# Patient Record
Sex: Female | Born: 1955
Health system: Southern US, Community
[De-identification: ages and names within clinical notes are randomized; demographics above are authoritative.]

## PROBLEM LIST (undated history)

## (undated) DIAGNOSIS — R5383 Other fatigue: Secondary | ICD-10-CM

## (undated) DIAGNOSIS — C801 Malignant (primary) neoplasm, unspecified: Secondary | ICD-10-CM

## (undated) DIAGNOSIS — J449 Chronic obstructive pulmonary disease, unspecified: Secondary | ICD-10-CM

## (undated) DIAGNOSIS — R0600 Dyspnea, unspecified: Secondary | ICD-10-CM

## (undated) DIAGNOSIS — F411 Generalized anxiety disorder: Secondary | ICD-10-CM

## (undated) DIAGNOSIS — J42 Unspecified chronic bronchitis: Secondary | ICD-10-CM

## (undated) DIAGNOSIS — E039 Hypothyroidism, unspecified: Secondary | ICD-10-CM

## (undated) DIAGNOSIS — R251 Tremor, unspecified: Secondary | ICD-10-CM

## (undated) DIAGNOSIS — F17200 Nicotine dependence, unspecified, uncomplicated: Secondary | ICD-10-CM

## (undated) DIAGNOSIS — K219 Gastro-esophageal reflux disease without esophagitis: Secondary | ICD-10-CM

## (undated) DIAGNOSIS — I499 Cardiac arrhythmia, unspecified: Secondary | ICD-10-CM

## (undated) DIAGNOSIS — D649 Anemia, unspecified: Secondary | ICD-10-CM

## (undated) DIAGNOSIS — F419 Anxiety disorder, unspecified: Secondary | ICD-10-CM

## (undated) DIAGNOSIS — J189 Pneumonia, unspecified organism: Secondary | ICD-10-CM

## (undated) DIAGNOSIS — R519 Headache, unspecified: Secondary | ICD-10-CM

## (undated) DIAGNOSIS — M1990 Unspecified osteoarthritis, unspecified site: Secondary | ICD-10-CM

## (undated) DIAGNOSIS — I10 Essential (primary) hypertension: Secondary | ICD-10-CM

## (undated) DIAGNOSIS — G250 Essential tremor: Secondary | ICD-10-CM

## (undated) DIAGNOSIS — R011 Cardiac murmur, unspecified: Secondary | ICD-10-CM

## (undated) DIAGNOSIS — E119 Type 2 diabetes mellitus without complications: Secondary | ICD-10-CM

## (undated) DIAGNOSIS — F32A Depression, unspecified: Secondary | ICD-10-CM

## (undated) DIAGNOSIS — I700 Atherosclerosis of aorta: Secondary | ICD-10-CM

## (undated) DIAGNOSIS — E079 Disorder of thyroid, unspecified: Secondary | ICD-10-CM

## (undated) DIAGNOSIS — R51 Headache: Secondary | ICD-10-CM

## (undated) DIAGNOSIS — F329 Major depressive disorder, single episode, unspecified: Secondary | ICD-10-CM

## (undated) HISTORY — PX: APPENDECTOMY: SHX54

## (undated) HISTORY — DX: Headache, unspecified: R51.9

## (undated) HISTORY — DX: Chronic obstructive pulmonary disease, unspecified: J44.9

## (undated) HISTORY — DX: Anxiety disorder, unspecified: F41.9

## (undated) HISTORY — DX: Essential (primary) hypertension: I10

## (undated) HISTORY — DX: Headache: R51

## (undated) HISTORY — DX: Type 2 diabetes mellitus without complications: E11.9

## (undated) HISTORY — DX: Depression, unspecified: F32.A

## (undated) HISTORY — DX: Other fatigue: R53.83

## (undated) HISTORY — DX: Disorder of thyroid, unspecified: E07.9

## (undated) HISTORY — DX: Tremor, unspecified: R25.1

## (undated) HISTORY — PX: TUBAL LIGATION: SHX77

## (undated) HISTORY — PX: COLECTOMY: SHX59

## (undated) HISTORY — PX: TONSILLECTOMY: SUR1361

## (undated) HISTORY — DX: Major depressive disorder, single episode, unspecified: F32.9

---

## 2008-10-15 ENCOUNTER — Observation Stay: Payer: Self-pay | Admitting: Internal Medicine

## 2009-06-08 ENCOUNTER — Emergency Department: Payer: Self-pay | Admitting: Emergency Medicine

## 2009-09-07 ENCOUNTER — Emergency Department: Payer: Self-pay | Admitting: Emergency Medicine

## 2012-02-08 DIAGNOSIS — M1712 Unilateral primary osteoarthritis, left knee: Secondary | ICD-10-CM | POA: Insufficient documentation

## 2012-02-08 DIAGNOSIS — M129 Arthropathy, unspecified: Secondary | ICD-10-CM | POA: Insufficient documentation

## 2013-11-04 DIAGNOSIS — C4491 Basal cell carcinoma of skin, unspecified: Secondary | ICD-10-CM | POA: Insufficient documentation

## 2013-12-17 DIAGNOSIS — Z720 Tobacco use: Secondary | ICD-10-CM | POA: Insufficient documentation

## 2013-12-17 DIAGNOSIS — J45909 Unspecified asthma, uncomplicated: Secondary | ICD-10-CM | POA: Insufficient documentation

## 2013-12-17 DIAGNOSIS — E039 Hypothyroidism, unspecified: Secondary | ICD-10-CM | POA: Insufficient documentation

## 2013-12-17 DIAGNOSIS — F329 Major depressive disorder, single episode, unspecified: Secondary | ICD-10-CM | POA: Insufficient documentation

## 2013-12-17 DIAGNOSIS — F32A Depression, unspecified: Secondary | ICD-10-CM | POA: Insufficient documentation

## 2013-12-17 DIAGNOSIS — F17200 Nicotine dependence, unspecified, uncomplicated: Secondary | ICD-10-CM | POA: Insufficient documentation

## 2014-01-13 DIAGNOSIS — E669 Obesity, unspecified: Secondary | ICD-10-CM | POA: Insufficient documentation

## 2014-01-13 DIAGNOSIS — G479 Sleep disorder, unspecified: Secondary | ICD-10-CM

## 2014-01-13 DIAGNOSIS — M2550 Pain in unspecified joint: Secondary | ICD-10-CM | POA: Insufficient documentation

## 2014-01-13 DIAGNOSIS — M25569 Pain in unspecified knee: Secondary | ICD-10-CM | POA: Insufficient documentation

## 2014-01-13 DIAGNOSIS — M25559 Pain in unspecified hip: Secondary | ICD-10-CM | POA: Insufficient documentation

## 2014-01-13 HISTORY — DX: Sleep disorder, unspecified: G47.9

## 2014-02-12 ENCOUNTER — Ambulatory Visit: Payer: Self-pay | Admitting: Neurology

## 2014-09-09 ENCOUNTER — Ambulatory Visit: Payer: Self-pay | Admitting: Rheumatology

## 2014-10-13 LAB — HM MAMMOGRAPHY

## 2014-11-05 DIAGNOSIS — G2581 Restless legs syndrome: Secondary | ICD-10-CM | POA: Insufficient documentation

## 2014-11-05 DIAGNOSIS — F1011 Alcohol abuse, in remission: Secondary | ICD-10-CM | POA: Insufficient documentation

## 2014-11-05 DIAGNOSIS — M797 Fibromyalgia: Secondary | ICD-10-CM | POA: Insufficient documentation

## 2014-11-05 DIAGNOSIS — F17200 Nicotine dependence, unspecified, uncomplicated: Secondary | ICD-10-CM | POA: Insufficient documentation

## 2014-11-05 DIAGNOSIS — F331 Major depressive disorder, recurrent, moderate: Secondary | ICD-10-CM | POA: Insufficient documentation

## 2014-11-05 DIAGNOSIS — Z8709 Personal history of other diseases of the respiratory system: Secondary | ICD-10-CM | POA: Insufficient documentation

## 2014-11-05 DIAGNOSIS — F411 Generalized anxiety disorder: Secondary | ICD-10-CM | POA: Insufficient documentation

## 2014-11-05 DIAGNOSIS — Z8639 Personal history of other endocrine, nutritional and metabolic disease: Secondary | ICD-10-CM | POA: Insufficient documentation

## 2014-11-05 DIAGNOSIS — Z8739 Personal history of other diseases of the musculoskeletal system and connective tissue: Secondary | ICD-10-CM | POA: Insufficient documentation

## 2015-03-02 ENCOUNTER — Ambulatory Visit: Payer: Self-pay | Admitting: Psychiatry

## 2015-03-03 ENCOUNTER — Encounter: Payer: Self-pay | Admitting: Psychiatry

## 2015-03-03 ENCOUNTER — Ambulatory Visit (INDEPENDENT_AMBULATORY_CARE_PROVIDER_SITE_OTHER): Payer: BLUE CROSS/BLUE SHIELD | Admitting: Psychiatry

## 2015-03-03 VITALS — BP 124/82 | HR 94 | Temp 97.1°F | Ht 69.0 in | Wt 235.0 lb

## 2015-03-03 DIAGNOSIS — J42 Unspecified chronic bronchitis: Secondary | ICD-10-CM | POA: Insufficient documentation

## 2015-03-03 DIAGNOSIS — F411 Generalized anxiety disorder: Secondary | ICD-10-CM

## 2015-03-03 DIAGNOSIS — F331 Major depressive disorder, recurrent, moderate: Secondary | ICD-10-CM | POA: Diagnosis not present

## 2015-03-03 MED ORDER — DULOXETINE HCL 60 MG PO CPEP: 60.0000 mg | ORAL_CAPSULE | Freq: Every day | ORAL | Status: DC

## 2015-03-03 NOTE — Progress Notes (Signed)
BH MD/PA/NP OP Progress Note  03/03/2015 8:58 AM Linda Bradford  MRN:  [id number]  Subjective:  Patient returns for follow-up for major depressive disorder recurrent moderate and generalized anxiety disorder and alcohol use disorder in full stay in remission. Patient states overall things are been going well for her. She states that the son that was causing problems related to his drug use has been living in another town a couple and is doing pretty well there. Patient states she has not been depressed. She states she has activities of walking with a relative. She also states she has some upcoming trips and a vacation in September. She states that she sleeps fairly well but wakes up once a night to go the bathroom. She states that her appetite is good. She states she really has not had much anxiety. She does not want any additional Xanax at this time. She states that the biggest thing is that she worries about what is going to happen to cause problems in her life. In particular she states her adult kids calling with a problem. She stated that when there is no drug use she has a healthy relationship with them and she asked whether it is good to have activities with her kids because a friend told her that activities with MR unhealthy. Writer indicated that  in general there is nothing wrong with that as long as it does not become problematic (i.e. patient mentioned being careful not to enable their behavior such as drug use" and 6. Chief Complaint:  Chief Complaint    Follow-up; Medication Refill     Visit Diagnosis:  No diagnosis found.  Past Medical History:  Past Medical History  Diagnosis Date  . Anxiety   . Asthma   . Depression   . Fatigue   . Headache   . Thyroid disease     Past Surgical History  Procedure Laterality Date  . Tubal ligation    . Appendectomy     Family History:  Family History  Problem Relation Age of Onset  . Lung cancer Mother   . Alcohol abuse Sister   . Arthritis  Sister   . Alcohol abuse Brother   . Heart disease Brother   . Heart attack Brother   . Pancreatic cancer Maternal Aunt   . Alcohol abuse Maternal Uncle   . Alcohol abuse Maternal Grandmother   . Alcohol abuse Son   . Drug abuse Son   . Drug abuse Son   . Heart disease Father   . Diabetes Sister   . Breast cancer Brother    Social History:  History   Social History  . Marital Status: Married    Spouse Name: N/A  . Number of Children: N/A  . Years of Education: N/A   Social History Main Topics  . Smoking status: Current Every Day Smoker -- 1.50 packs/day    Types: Cigarettes    Start date: 03/02/1980  . Smokeless tobacco: Never Used  . Alcohol Use: No  . Drug Use: No  . Sexual Activity: Yes    Birth Control/ Protection: None   Other Topics Concern  . None   Social History Narrative   Additional History:   Assessment:   Musculoskeletal: Strength & Muscle Tone: within normal limits Gait & Station: normal Patient leans: N/A  Psychiatric Specialty Exam: HPI  Review of Systems  Psychiatric/Behavioral: Negative for depression, suicidal ideas, hallucinations, memory loss and substance abuse. The patient has insomnia (wakes once a night to  go to the bathroom). The patient is not nervous/anxious.     Blood pressure 124/82, pulse 94, temperature 97.1 F (36.2 C), temperature source Tympanic, height 5\' 9"  (1.753 m), weight 106.595 kg (235 lb), SpO2 94 %.Body mass index is 34.69 kg/(m^2).  General Appearance: Well Groomed  Eye Contact:  Good  Speech:  Normal Rate  Volume:  Normal  Mood:  Good  Affect:  Congruent  Thought Process:  Linear and Logical  Orientation:  Full (Time, Place, and Person)  Thought Content:  Negative  Suicidal Thoughts:  No  Homicidal Thoughts:  No  Memory:  Immediate;   Good Recent;   Good Remote;   Good  Judgement:  Good  Insight:  Good  Psychomotor Activity:  Negative  Concentration:  Good  Recall:  Good  Fund of Knowledge: Good   Language: Good  Akathisia:  Negative  Handed:  Right unknown   AIMS (if indicated): N/A  Assets:  Communication Skills Desire for Improvement Vocational/Educational  ADL's:  Intact  Cognition: WNL  Sleep:  fair   Is the patient at risk to self?  No. Has the patient been a risk to self in the past 6 months?  No. Has the patient been a risk to self within the distant past?  No. Is the patient a risk to others?  No. Has the patient been a risk to others in the past 6 months?  No. Has the patient been a risk to others within the distant past?  No.  Current Medications: Current Outpatient Prescriptions  Medication Sig Dispense Refill  . albuterol (PROVENTIL HFA;VENTOLIN HFA) 108 (90 BASE) MCG/ACT inhaler Inhale 1 puff into the lungs as needed.    Marland Kitchen aspirin 81 MG tablet Take 81 mg by mouth.    . DULoxetine (CYMBALTA) 60 MG capsule Take 1 capsule by mouth daily.    . fluticasone (FLONASE) 50 MCG/ACT nasal spray Frequency:PHARMDIR   Dosage:50   MCG  Instructions:  Note:2 sprays to each nostril daily Dose: 50MCG    . Fluticasone-Salmeterol (ADVAIR DISKUS) 250-50 MCG/DOSE AEPB Inhale into the lungs.    . Fluticasone-Salmeterol (ADVAIR) 250-50 MCG/DOSE AEPB Inhale 1 puff into the lungs 2 (two) times daily.    Marland Kitchen gabapentin (NEURONTIN) 300 MG capsule Take 3 capsules by mouth at bedtime.    Marland Kitchen levothyroxine (SYNTHROID, LEVOTHROID) 50 MCG tablet Take 1 tablet by mouth daily.    Marland Kitchen ALPRAZolam (XANAX) 0.25 MG tablet Take 1 tablet by mouth 2 (two) times daily as needed.    . citalopram (CELEXA) 40 MG tablet Take 1.5 tablets by mouth daily.    . Cyanocobalamin (RA VITAMIN B-12 TR) 1000 MCG TBCR Take by mouth.    . DEXILANT 60 MG capsule   4  . HYDROcodone-homatropine (HYCODAN) 5-1.5 MG/5ML syrup   0  . levofloxacin (LEVAQUIN) 500 MG tablet   0  . meloxicam (MOBIC) 15 MG tablet   4  . naproxen sodium (ANAPROX) 220 MG tablet Take 1 tablet by mouth as needed.    . nortriptyline (PAMELOR) 10 MG capsule  Take 1 capsule by mouth 2 (two) times daily.    . predniSONE (STERAPRED UNI-PAK 21 TAB) 10 MG (21) TBPK tablet   0  . tiZANidine (ZANAFLEX) 4 MG capsule   4  . traMADol (ULTRAM) 50 MG tablet Take 1 tablet by mouth 4 (four) times daily as needed.     No current facility-administered medications for this visit.    Medical Decision Making:  Established Problem,  Stable/Improving (1) and Review of Medication Regimen & Side Effects (2)  Treatment Plan Summary:Medication management and Plan Patient is stable on her Cymbalta 60 mg daily. We will continue her on this medication at this dose. She will return in 3 months. She is encouraged calling questions or concerns prior to her next appointment.    Faith Rogue 03/03/2015, 8:58 AM

## 2015-05-13 ENCOUNTER — Ambulatory Visit (INDEPENDENT_AMBULATORY_CARE_PROVIDER_SITE_OTHER): Payer: BLUE CROSS/BLUE SHIELD | Admitting: Physician Assistant

## 2015-05-13 ENCOUNTER — Encounter: Payer: Self-pay | Admitting: Physician Assistant

## 2015-05-13 VITALS — BP 140/70 | HR 84 | Temp 98.4°F | Resp 18 | Ht 69.0 in | Wt 239.2 lb

## 2015-05-13 DIAGNOSIS — J411 Mucopurulent chronic bronchitis: Secondary | ICD-10-CM | POA: Diagnosis not present

## 2015-05-13 DIAGNOSIS — Z7189 Other specified counseling: Secondary | ICD-10-CM | POA: Diagnosis not present

## 2015-05-13 DIAGNOSIS — M545 Low back pain, unspecified: Secondary | ICD-10-CM

## 2015-05-13 DIAGNOSIS — R350 Frequency of micturition: Secondary | ICD-10-CM

## 2015-05-13 DIAGNOSIS — Z91048 Other nonmedicinal substance allergy status: Secondary | ICD-10-CM

## 2015-05-13 DIAGNOSIS — K219 Gastro-esophageal reflux disease without esophagitis: Secondary | ICD-10-CM

## 2015-05-13 DIAGNOSIS — R1030 Lower abdominal pain, unspecified: Secondary | ICD-10-CM | POA: Diagnosis not present

## 2015-05-13 DIAGNOSIS — J42 Unspecified chronic bronchitis: Secondary | ICD-10-CM | POA: Insufficient documentation

## 2015-05-13 DIAGNOSIS — M6248 Contracture of muscle, other site: Secondary | ICD-10-CM | POA: Diagnosis not present

## 2015-05-13 DIAGNOSIS — Z7689 Persons encountering health services in other specified circumstances: Secondary | ICD-10-CM

## 2015-05-13 DIAGNOSIS — M62838 Other muscle spasm: Secondary | ICD-10-CM

## 2015-05-13 DIAGNOSIS — E039 Hypothyroidism, unspecified: Secondary | ICD-10-CM

## 2015-05-13 DIAGNOSIS — J40 Bronchitis, not specified as acute or chronic: Secondary | ICD-10-CM | POA: Diagnosis not present

## 2015-05-13 DIAGNOSIS — G2581 Restless legs syndrome: Secondary | ICD-10-CM | POA: Diagnosis not present

## 2015-05-13 DIAGNOSIS — Z9109 Other allergy status, other than to drugs and biological substances: Secondary | ICD-10-CM

## 2015-05-13 DIAGNOSIS — J0100 Acute maxillary sinusitis, unspecified: Secondary | ICD-10-CM | POA: Diagnosis not present

## 2015-05-13 LAB — POCT URINALYSIS DIPSTICK
Bilirubin, UA: NEGATIVE
Blood, UA: NEGATIVE
Glucose, UA: NEGATIVE
Ketones, UA: NEGATIVE
Leukocytes, UA: NEGATIVE
Nitrite, UA: NEGATIVE
Protein, UA: NEGATIVE
Spec Grav, UA: <=1.005
Urobilinogen, UA: 0.2
pH, UA: 6

## 2015-05-13 MED ORDER — TIZANIDINE HCL 4 MG PO CAPS: 4.0000 mg | ORAL_CAPSULE | Freq: Three times a day (TID) | ORAL | Status: DC

## 2015-05-13 MED ORDER — MELOXICAM 15 MG PO TABS: 15.0000 mg | ORAL_TABLET | Freq: Every day | ORAL | Status: DC

## 2015-05-13 MED ORDER — DEXILANT 60 MG PO CPDR: 60.0000 mg | DELAYED_RELEASE_CAPSULE | Freq: Every day | ORAL | Status: DC

## 2015-05-13 MED ORDER — LEVOTHYROXINE SODIUM 50 MCG PO TABS: 50.0000 ug | ORAL_TABLET | Freq: Every day | ORAL | Status: DC

## 2015-05-13 MED ORDER — CLINDAMYCIN HCL 300 MG PO CAPS: 300.0000 mg | ORAL_CAPSULE | Freq: Three times a day (TID) | ORAL | Status: DC

## 2015-05-13 MED ORDER — PREDNISONE 10 MG PO TABS: ORAL_TABLET | ORAL | Status: DC

## 2015-05-13 MED ORDER — GABAPENTIN 300 MG PO CAPS: 1200.0000 mg | ORAL_CAPSULE | Freq: Every day | ORAL | Status: DC

## 2015-05-13 MED ORDER — ALBUTEROL SULFATE HFA 108 (90 BASE) MCG/ACT IN AERS: 1.0000 | INHALATION_SPRAY | RESPIRATORY_TRACT | Status: DC | PRN

## 2015-05-13 MED ORDER — FLUTICASONE PROPIONATE 50 MCG/ACT NA SUSP: 2.0000 | Freq: Every day | NASAL | Status: DC

## 2015-05-13 MED ORDER — HYDROCODONE-HOMATROPINE 5-1.5 MG/5ML PO SYRP
5.0000 mL | ORAL_SOLUTION | Freq: Three times a day (TID) | ORAL | Status: DC | PRN
Start: 2015-05-13 — End: 2015-06-03

## 2015-05-13 NOTE — Progress Notes (Signed)
Patient: Linda Bradford, Female    DOB: August 28, 1955, 59 y.o.   MRN: [id number] Visit Date: 05/13/2015  Today's Provider: Mar Daring, PA-C   Chief Complaint  Patient presents with  . RE-ESTABLISH CARE   Subjective:    Annual physical exam Linda Bradford is a 59 y.o. female who presents today for health maintenance and complete physical. She feels fairly well. She reports not exercising. She reports she is sleeping well. She does have chronic mucopurulent bronchitis which she states she has had for 20-30 years. She states that yesterday she had worsening sinus pressure and congestion with increasing cough and mucus production. She does have an Advair and albuterol inhaler. These have not been helping. She has noticed increased wheezing. She states she is using her albuterol inhaler more frequently now. Along with this she would also like to have her medications refilled.    Review of Systems  Constitutional: Negative.   HENT: Positive for congestion and sneezing.   Eyes: Negative.   Respiratory: Positive for cough and wheezing.   Cardiovascular: Negative.   Gastrointestinal: Positive for abdominal pain.  Endocrine: Negative.   Genitourinary: Positive for frequency and flank pain.  Musculoskeletal: Positive for myalgias, back pain, joint swelling, arthralgias, neck pain and neck stiffness.  Allergic/Immunologic: Negative.   Neurological: Negative.   Hematological: Negative.   Psychiatric/Behavioral: Negative.     Social History She  reports that she has been smoking Cigarettes.  She started smoking about 35 years ago. She has been smoking about 1.50 packs per day. She has never used smokeless tobacco. She reports that she does not drink alcohol or use illicit drugs. Social History   Social History  . Marital Status: Married    Spouse Name: N/A  . Number of Children: N/A  . Years of Education: N/A   Social History Main Topics  . Smoking status: Current Every Day  Smoker -- 1.50 packs/day    Types: Cigarettes    Start date: 03/02/1980  . Smokeless tobacco: Never Used  . Alcohol Use: No  . Drug Use: No  . Sexual Activity: Yes    Birth Control/ Protection: None   Other Topics Concern  . None   Social History Narrative    Patient Active Problem List   Diagnosis Date Noted  . Chronic bronchitis ([place]) 05/13/2015  . Bronchitis, chronic ([place]) 03/03/2015  . Depression, major, recurrent, moderate ([place]) 11/05/2014  . Anxiety, generalized 11/05/2014  . Alcohol abuse, in remission 11/05/2014  . Nicotine addiction 11/05/2014  . Restless leg 11/05/2014  . H/O bronchitis 11/05/2014  . H/O: osteoarthritis 11/05/2014  . Fibromyalgia 11/05/2014  . H/O: hypothyroidism 11/05/2014  . Restless leg syndrome 11/05/2014  . Arthralgia of hip 01/13/2014  . Ache in joint 01/13/2014  . Gonalgia 01/13/2014  . Adiposity 01/13/2014  . Disordered sleep 01/13/2014  . Airway hyperreactivity 12/17/2013  . Clinical depression 12/17/2013  . Adult hypothyroidism 12/17/2013  . Compulsive tobacco user syndrome 12/17/2013  . Current tobacco use 12/17/2013  . Basal cell carcinoma 11/04/2013  . Arthropathia 02/08/2012    Past Surgical History  Procedure Laterality Date  . Tubal ligation    . Appendectomy      Family History  Family Status  Relation Status Death Age  . Mother Deceased   . Sister Alive   . Brother Deceased   . Maternal Aunt Deceased   . Son Alive   . Son Alive   . Father Deceased   . Sister  Alive   . Brother Deceased    Her family history includes Alcohol abuse in her brother, maternal grandmother, maternal uncle, sister, and son; Arthritis in her sister; Breast cancer in her brother; Diabetes in her sister; Drug abuse in her son and son; Heart attack in her brother; Heart disease in her brother and father; Lung cancer in her mother; Pancreatic cancer in her maternal aunt.    No Known Allergies  Previous Medications   ALBUTEROL (PROVENTIL  HFA;VENTOLIN HFA) 108 (90 BASE) MCG/ACT INHALER    Inhale 1 puff into the lungs as needed.   ASPIRIN 81 MG TABLET    Take 81 mg by mouth.   CYANOCOBALAMIN (RA VITAMIN B-12 TR) 1000 MCG TBCR    Take by mouth.   DEXILANT 60 MG CAPSULE       DULOXETINE (CYMBALTA) 60 MG CAPSULE    Take 1 capsule (60 mg total) by mouth daily.   FLUTICASONE (FLONASE) 50 MCG/ACT NASAL SPRAY    Frequency:PHARMDIR   Dosage:50   MCG  Instructions:  Note:2 sprays to each nostril daily Dose: 50MCG   FLUTICASONE-SALMETEROL (ADVAIR DISKUS) 250-50 MCG/DOSE AEPB    Inhale into the lungs.   GABAPENTIN (NEURONTIN) 300 MG CAPSULE    Take 3 capsules by mouth at bedtime.   LEVOFLOXACIN (LEVAQUIN) 500 MG TABLET       LEVOTHYROXINE (SYNTHROID, LEVOTHROID) 50 MCG TABLET    Take 1 tablet by mouth daily.   MELOXICAM (MOBIC) 15 MG TABLET       NAPROXEN SODIUM (ANAPROX) 220 MG TABLET    Take 1 tablet by mouth as needed.   PREDNISONE (STERAPRED UNI-PAK 21 TAB) 10 MG (21) TBPK TABLET       TIZANIDINE (ZANAFLEX) 4 MG CAPSULE        Patient Care Team: Birdie Sons, MD as PCP - General (Family Medicine)     Objective:   Vitals: There were no vitals taken for this visit.   Physical Exam  Constitutional: She is oriented to person, place, and time. She appears well-developed and well-nourished. No distress.  HENT:  Head: Normocephalic and atraumatic.  Right Ear: Hearing, tympanic membrane, external ear and ear canal normal.  Left Ear: Hearing, tympanic membrane, external ear and ear canal normal.  Nose: Mucosal edema and rhinorrhea present. Right sinus exhibits maxillary sinus tenderness and frontal sinus tenderness. Left sinus exhibits maxillary sinus tenderness and frontal sinus tenderness.  Mouth/Throat: Uvula is midline, oropharynx is clear and moist and mucous membranes are normal. No oropharyngeal exudate.  Eyes: Conjunctivae and EOM are normal. Pupils are equal, round, and reactive to light. Right eye exhibits no discharge.  Left eye exhibits no discharge. No scleral icterus.  Neck: Normal range of motion. Neck supple. No JVD present. No tracheal deviation present. No thyromegaly present.  Cardiovascular: Normal rate, regular rhythm, normal heart sounds and intact distal pulses.  Exam reveals no gallop and no friction rub.   No murmur heard. Pulmonary/Chest: Effort normal. No accessory muscle usage. No respiratory distress. She has wheezes (throughout). She has no rhonchi. She has rales in the right upper field, the right middle field, the left upper field and the left middle field. She exhibits no tenderness.  Abdominal: Soft. Bowel sounds are normal. She exhibits no distension and no mass. There is no tenderness. There is no rebound and no guarding.  Musculoskeletal: Normal range of motion. She exhibits no edema or tenderness.  Lymphadenopathy:    She has no cervical adenopathy.  Neurological: She  is alert and oriented to person, place, and time.  Skin: Skin is warm and dry. No rash noted. She is not diaphoretic.  Psychiatric: She has a normal mood and affect. Her behavior is normal. Judgment and thought content normal.  Vitals reviewed.    Depression Screen No flowsheet data found.    Assessment & Plan:     Routine Health Maintenance and Physical Exam  1. Bronchitis Acute on chronic bronchitis. She states that she has had chronic bronchitis for approximately 20-30 years. She just recently developed a new worsening cough with sinus pressure and drainage. I will treat as below. She is to call the office if symptoms fail to improve or worsen. - clindamycin (CLEOCIN) 300 MG capsule; Take 1 capsule (300 mg total) by mouth 3 (three) times daily.  Dispense: 30 capsule; Refill: 0 - HYDROcodone-homatropine (HYCODAN) 5-1.5 MG/5ML syrup; Take 5 mLs by mouth every 8 (eight) hours as needed for cough.  Dispense: 180 mL; Refill: 0 - predniSONE (DELTASONE) 10 MG tablet; Take 6 tabs PO on day 1&2, 5 tabs PO on day 3&4,  4 tabs PO on day 5&6, 3 tabs PO on day 7&8, 2 tabs PO on day 9&10, 1 tab PO on day 11&12.  Dispense: 42 tablet; Refill: 0  2. Mucopurulent chronic bronchitis (HCC) Albuterol refilled as below. See above for acute on chronic bronchitis. I will have her return in approximately 3 months to recheck her chronic bronchitis and to get spirometry. She has not had as prominent history test done in a long time she states. She will return in 3 months for this. - albuterol (PROVENTIL HFA;VENTOLIN HFA) 108 (90 BASE) MCG/ACT inhaler; Inhale 1 puff into the lungs every 4 (four) hours as needed.  Dispense: 1 Inhaler; Refill: 11  3. Acute maxillary sinusitis, recurrence not specified She was very tender over palpation of the right maxillary sinus. I do feel that she has an acute maxillary sinusitis associated with acute on chronic bronchitis. I will treat her as below. She is to call the office if she has any worsening symptoms. - clindamycin (CLEOCIN) 300 MG capsule; Take 1 capsule (300 mg total) by mouth 3 (three) times daily.  Dispense: 30 capsule; Refill: 0  4. Frequency of urination UA was negative for leukocytes and nitrates. Will send for culture to make sure that there is no other cause for frequency.  5. Lower abdominal pain See above medical treatment plan for frequency of urination. - POCT urinalysis dipstick - Urine culture  6. Environmental allergies This is been stable with Flonase. Diagnosis was pulled to refill Flonase as below. - fluticasone (FLONASE) 50 MCG/ACT nasal spray; Place 2 sprays into both nostrils daily.  Dispense: 16 g; Refill: 11  7. Gastroesophageal reflux disease without esophagitis This is been stable and well controlled with excellent. She has tried many previous medications and failed them. Has controlled her symptoms for a while now. Is refilled as below. - DEXILANT 60 MG capsule; Take 1 capsule (60 mg total) by mouth daily.  Dispense: 30 capsule; Refill: 6  8. Restless  leg syndrome Was recently increased by her other physician to 4 tablets at bedtime to help with her restless leg syndrome. She states that the 4 tablets have been working well. This was refilled as below. She is to call the office if symptoms worsen. - gabapentin (NEURONTIN) 300 MG capsule; Take 4 capsules (1,200 mg total) by mouth at bedtime.  Dispense: 120 capsule; Refill: 6  9. Hypothyroidism, unspecified hypothyroidism  type Has been stable on levothyroxine 50 g daily. This medication was refilled as below. We will recheck her thyroid panel in 3 months with follow-up. - levothyroxine (SYNTHROID, LEVOTHROID) 50 MCG tablet; Take 1 tablet (50 mcg total) by mouth daily before breakfast.  Dispense: 30 tablet; Refill: 6  10. Bilateral low back pain without sciatica This has been stable and controlled with meloxicam. Medication was refilled as below. She is to call the office if symptoms worsen. - meloxicam (MOBIC) 15 MG tablet; Take 1 tablet (15 mg total) by mouth daily.  Dispense: 30 tablet; Refill: 6  11. Muscle spasms of head and/or neck This is been stable and well controlled with tizanidine. I will refill the medication as below. She is to call the office if symptoms worsen. - tiZANidine (ZANAFLEX) 4 MG capsule; Take 1 capsule (4 mg total) by mouth 3 (three) times daily.  Dispense: 90 capsule; Refill: 6   Exercise Activities and Dietary recommendations Goals    None       There is no immunization history on file for this patient.  Health Maintenance  Topic Date Due  . Hepatitis C Screening  [birth year]  . HIV Screening  01/04/1971  . TETANUS/TDAP  01/04/1975  . PAP SMEAR  01/03/1977  . MAMMOGRAM  01/03/2006  . COLONOSCOPY  01/03/2006  . INFLUENZA VACCINE  03/02/2015      Discussed health benefits of physical activity, and encouraged her to engage in regular exercise appropriate for her age and condition.     --------------------------------------------------------------------

## 2015-05-13 NOTE — Patient Instructions (Signed)
Chronic Obstructive Pulmonary Disease Exacerbation Chronic obstructive pulmonary disease (COPD) is a common lung condition in which airflow from the lungs is limited. COPD is a general term that can be used to describe many different lung problems that limit airflow, including chronic bronchitis and emphysema. COPD exacerbations are episodes when breathing symptoms become much worse and require extra treatment. Without treatment, COPD exacerbations can be life threatening, and frequent COPD exacerbations can cause further damage to your lungs. CAUSES  Respiratory infections.  Exposure to smoke.  Exposure to air pollution, chemical fumes, or dust. Sometimes there is no apparent cause or trigger. RISK FACTORS  Smoking cigarettes.  Older age.  Frequent prior COPD exacerbations. SIGNS AND SYMPTOMS  Increased coughing.  Increased thick spit (sputum) production.  Increased wheezing.  Increased shortness of breath.  Rapid breathing.  Chest tightness. DIAGNOSIS Your medical history, a physical exam, and tests will help your health care provider make a diagnosis. Tests may include:  A chest X-ray.  Basic lab tests.  Sputum testing.  An arterial blood gas test. TREATMENT Depending on the severity of your COPD exacerbation, you may need to be admitted to a hospital for treatment. Some of the treatments commonly used to treat COPD exacerbations are:   Antibiotic medicines.  Bronchodilators. These are drugs that expand the air passages. They may be given with an inhaler or nebulizer. Spacer devices may be needed to help improve drug delivery.  Corticosteroid medicines.  Supplemental oxygen therapy.  Airway clearing techniques, such as noninvasive ventilation (NIV) and positive expiratory pressure (PEP). These provide respiratory support through a mask or other noninvasive device. HOME CARE INSTRUCTIONS  Do not smoke. Quitting smoking is very important to prevent COPD from  getting worse and exacerbations from happening as often.  Avoid exposure to all substances that irritate the airway, especially to tobacco smoke.  If you were prescribed an antibiotic medicine, finish it all even if you start to feel better.  Take all medicines as directed by your health care provider.It is important to use correct technique with inhaled medicines.  Drink enough fluids to keep your urine clear or pale yellow (unless you have a medical condition that requires fluid restriction).  Use a cool mist vaporizer. This makes it easier to clear your chest when you cough.  If you have a home nebulizer and oxygen, continue to use them as directed.  Maintain all necessary vaccinations to prevent infections.  Exercise regularly.  Eat a healthy diet.  Keep all follow-up appointments as directed by your health care provider. SEEK IMMEDIATE MEDICAL CARE IF:  You have worsening shortness of breath.  You have trouble talking.  You have severe chest pain.  You have blood in your sputum.  You have a fever.  You have weakness, vomit repeatedly, or faint.  You feel confused.  You continue to get worse. MAKE SURE YOU:  Understand these instructions.  Will watch your condition.  Will get help right away if you are not doing well or get worse.   This information is not intended to replace advice given to you by your health care provider. Make sure you discuss any questions you have with your health care provider.   Document Released: 05/15/2007 Document Revised: 08/08/2014 Document Reviewed: 03/22/2013 Elsevier Interactive Patient Education 2016 [employer]  Fluticasone; Vilanterol inhalation powder What is this medicine? FLUTICASONE; VILANTEROL (floo TIK a sone; vye LAN ter ol) inhalation is a combination of two medicines that decrease inflammation and help to open up the  airways of your lungs. It is for chronic obstructive pulmonary disease (COPD), including chronic  bronchitis or emphysema. It is also used for asthma in adults to help control symptoms. Do NOT use for an acute asthma attack or COPD attack. This medicine may be used for other purposes; ask your health care provider or pharmacist if you have questions. What should I tell my health care provider before I take this medicine? They need to know if you have any of these conditions: -bone problems -immune system problems -diabetes -heart disease or irregular heartbeat -high blood pressure -infection -pheochromocytoma -seizures -thyroid disease -an unusual or allergic reaction to fluticasone, vilanterol, milk proteins, corticosteroids, other medicines, foods, dyes, or preservatives -pregnant or trying to get pregnant -breast-feeding How should I use this medicine? This medicine is inhaled through the mouth. It is used once per day. Follow the directions on the prescription label. Do not use a spacer device with this inhaler. Take your medicine at regular intervals. Do not take your medicine more often than directed. Do not stop taking except on your doctor's advice. Make sure that you are using your inhaler correctly. Ask you doctor or health care provider if you have any questions. A special MedGuide will be given to you by the pharmacist with each prescription and refill. Be sure to read this information carefully each time. Talk to your pediatrician regarding the use of this medicine in children. Special care may be needed. This medicine is not approved for use in children under 35 years of age. Overdosage: If you think you have taken too much of this medicine contact a poison control center or emergency room at once. NOTE: This medicine is only for you. Do not share this medicine with others. What if I miss a dose? If you miss a dose, use it as soon as you can. If it is almost time for your next dose, use only that dose and continue with your regular schedule. Do not use double or extra  doses. What may interact with this medicine? Do not take this medicine with any of the following medications: -cisapride -dofetilide -dronedarone -MAOIs like Carbex, Eldepryl, Marplan, Nardil, and Parnate -pimozide -thioridazine -ziprasidone This medicine may also interact with the following medications: -antiviral medicines for HIV or AIDS -beta-blockers like metoprolol and propranolol -certain medicines for depression, anxiety, or psychotic disturbances -diuretics -medicines for colds -medicines for fungal infections like ketoconazole and itraconazole -other medicines for breathing problems -other medicines that prolong the QT interval (cause an abnormal heart rhythm) This list may not describe all possible interactions. Give your health care provider a list of all the medicines, herbs, non-prescription drugs, or dietary supplements you use. Also tell them if you smoke, drink alcohol, or use illegal drugs. Some items may interact with your medicine. What should I watch for while using this medicine? Visit your doctor or health care professional for regular checkups. Tell your doctor or health care professional if your symptoms do not get better. If your symptoms get worse or if you need your short-acting inhalers more often, call your doctor right away. Do not use this medicine more than every 24 hours. If you are going to have surgery tell your doctor or health care professional that you are using this medicine. Try not to come in contact with people with the chicken pox or measles. If you do, call your doctor. What side effects may I notice from receiving this medicine? Side effects that you should report to your doctor or  health care professional as soon as possible: -allergic reactions like skin rash or hives, swelling of the face, lips, or tongue -breathing problems right after inhaling your medicine -changes in vision -chest pain -fast, irregular heartbeat -feeling faint or  lightheaded, falls -fever or chills -nausea, vomiting -tiredness Side effects that usually do not require medical attention (Report these to your doctor or health care professional if they continue or are bothersome.): -cough -headache -nervousness -sore throat -tremor This list may not describe all possible side effects. Call your doctor for medical advice about side effects. You may report side effects to FDA at 1-800-FDA-1088. Where should I keep my medicine? Keep out of the reach of children. Store at room temperature between 15 and 30 degrees C (59 and 86 degrees F). Store in a dry place away from direct heat or sunlight. Throw away 6 weeks after you remove the inhaler from the foil tray, or after the dose indicator reads 0, whichever comes first. Throw away any unopened packages after the expiration date. NOTE: This sheet is a summary. It may not cover all possible information. If you have questions about this medicine, talk to your doctor, pharmacist, or health care provider.    2016, Elsevier/Gold Standard. (2014-01-13 15:06:05)

## 2015-05-15 LAB — PLEASE NOTE

## 2015-05-15 LAB — URINE CULTURE

## 2015-05-18 ENCOUNTER — Telehealth: Payer: Self-pay | Admitting: Physician Assistant

## 2015-05-18 MED ORDER — LEVOFLOXACIN 500 MG PO TABS: 500.0000 mg | ORAL_TABLET | Freq: Every day | ORAL | Status: DC

## 2015-05-18 NOTE — Telephone Encounter (Signed)
Pt states she was in last week for a cold.  Pt does not feel like she is ant better, maybe worse.  Pt states she also has diarrhea.  Pt is requesting a different Rx if possible.  [facility].  ON#[phone]/[id number]

## 2015-05-18 NOTE — Telephone Encounter (Signed)
Sent in levaquin to [facility], [place].  She is to take once daily.  Call if symptoms fail to improve.

## 2015-05-18 NOTE — Telephone Encounter (Signed)
Duplicate note

## 2015-05-22 ENCOUNTER — Telehealth: Payer: Self-pay

## 2015-05-22 DIAGNOSIS — B379 Candidiasis, unspecified: Secondary | ICD-10-CM

## 2015-05-22 DIAGNOSIS — T3695XA Adverse effect of unspecified systemic antibiotic, initial encounter: Principal | ICD-10-CM

## 2015-05-22 MED ORDER — FLUCONAZOLE 150 MG PO TABS: ORAL_TABLET | ORAL | Status: DC

## 2015-05-22 NOTE — Telephone Encounter (Signed)
This has been sent to [facility].  Thanks!

## 2015-05-22 NOTE — Telephone Encounter (Signed)
Patient states that she usually gets yeast infection with this antibiotic before and she has developed this again and she wants to finish this antibiotic but would like to get Diflucan RX, to help with yeast infection>-aa

## 2015-05-22 NOTE — Telephone Encounter (Signed)
Pt advised-aa 

## 2015-05-28 ENCOUNTER — Telehealth: Payer: Self-pay | Admitting: Physician Assistant

## 2015-05-28 DIAGNOSIS — R05 Cough: Secondary | ICD-10-CM

## 2015-05-28 DIAGNOSIS — J40 Bronchitis, not specified as acute or chronic: Secondary | ICD-10-CM

## 2015-05-28 DIAGNOSIS — R059 Cough, unspecified: Secondary | ICD-10-CM

## 2015-05-28 MED ORDER — BENZONATATE 200 MG PO CAPS: 200.0000 mg | ORAL_CAPSULE | Freq: Three times a day (TID) | ORAL | Status: DC | PRN

## 2015-05-28 MED ORDER — LEVOFLOXACIN 500 MG PO TABS: 500.0000 mg | ORAL_TABLET | Freq: Every day | ORAL | Status: DC

## 2015-05-28 MED ORDER — PREDNISONE 10 MG PO TABS: ORAL_TABLET | ORAL | Status: DC

## 2015-05-28 NOTE — Telephone Encounter (Signed)
Meds sent to [facility].  If still no improvement afterwards will need visit.

## 2015-05-28 NOTE — Telephone Encounter (Signed)
Pt states she was on 05/13/15 and not any better.  Pt states she is still coughing and still has chest congestion.  Pt is asking does she need another round of medication.  [facility].  IC#[id number]

## 2015-05-28 NOTE — Telephone Encounter (Signed)
Linda Sat do you want to ok this?

## 2015-05-29 NOTE — Telephone Encounter (Signed)
Patient advised as directed below.  Thanks,  -Kambrie Eddleman 

## 2015-06-03 ENCOUNTER — Encounter: Payer: Self-pay | Admitting: Psychiatry

## 2015-06-03 ENCOUNTER — Ambulatory Visit (INDEPENDENT_AMBULATORY_CARE_PROVIDER_SITE_OTHER): Payer: BLUE CROSS/BLUE SHIELD | Admitting: Psychiatry

## 2015-06-03 VITALS — BP 122/86 | HR 95 | Temp 97.2°F | Ht 68.5 in | Wt 235.8 lb

## 2015-06-03 DIAGNOSIS — F411 Generalized anxiety disorder: Secondary | ICD-10-CM

## 2015-06-03 DIAGNOSIS — G4700 Insomnia, unspecified: Secondary | ICD-10-CM

## 2015-06-03 DIAGNOSIS — F331 Major depressive disorder, recurrent, moderate: Secondary | ICD-10-CM

## 2015-06-03 MED ORDER — CLONAZEPAM 0.5 MG PO TABS: 0.2500 mg | ORAL_TABLET | Freq: Two times a day (BID) | ORAL | Status: DC | PRN

## 2015-06-03 MED ORDER — QUETIAPINE FUMARATE 50 MG PO TABS: 50.0000 mg | ORAL_TABLET | Freq: Every day | ORAL | Status: DC

## 2015-06-03 NOTE — Progress Notes (Signed)
[facility] MD/PA/NP OP Progress Note  06/03/2015 8:59 AM Linda Bradford  MRN:  [id number]  Subjective:  Patient returns for follow-up for major depressive disorder recurrent moderate and generalized anxiety disorder and alcohol use disorder in full stay in remission. She reports that she is a "mess." She indicates that over the past 3 months her depression is become worse. She states she does not want to do anything and is not enjoying anything. She states on volition she's not suicidal. She does state she's let everyone back into her life including her son who has reported had issues with drugs. She states that in addition she is involved with other children as well and she's been expressing anxiety. She states that probably over the course of the past few weeks her anxieties become worse to the point where it's almost constant. She states she has an anxious feeling and feels like her chest is fluttering and that yesterday it persisted the whole day. Not sleeping well.. Chief Complaint:  Chief Complaint    Anxiety; Depression; Follow-up; Medication Refill     Visit Diagnosis:     ICD-9-CM ICD-10-CM   1. Major depressive disorder, recurrent episode, moderate (HCC) 296.32 F33.1   2. GAD (generalized anxiety disorder) 300.02 F41.1     Past Medical History:  Past Medical History  Diagnosis Date  . Anxiety   . Asthma   . Depression   . Fatigue   . Headache   . Thyroid disease     Past Surgical History  Procedure Laterality Date  . Tubal ligation    . Appendectomy     Family History:  Family History  Problem Relation Age of Onset  . Lung cancer Mother   . Alcohol abuse Sister   . Arthritis Sister   . Alcohol abuse Brother   . Heart disease Brother   . Heart attack Brother   . Pancreatic cancer Maternal Aunt   . Alcohol abuse Maternal Uncle   . Alcohol abuse Maternal Grandmother   . Alcohol abuse Son   . Drug abuse Son   . Drug abuse Son   . Heart disease Father   . Diabetes Sister   .  Breast cancer Brother    Social History:  Social History   Social History  . Marital Status: Married    Spouse Name: N/A  . Number of Children: N/A  . Years of Education: N/A   Social History Main Topics  . Smoking status: Current Every Day Smoker -- 1.50 packs/day    Types: Cigarettes    Start date: 03/02/1980  . Smokeless tobacco: Never Used  . Alcohol Use: No  . Drug Use: No  . Sexual Activity: Yes    Birth Control/ Protection: None   Other Topics Concern  . None   Social History Narrative   Additional History:   Assessment:   Musculoskeletal: Strength & Muscle Tone: within normal limits Gait & Station: normal Patient leans: N/A  Psychiatric Specialty Exam: Anxiety Symptoms include insomnia (wakes once a night to go to the bathroom) and nervous/anxious behavior. Patient reports no suicidal ideas.    Depression        Associated symptoms include insomnia (wakes once a night to go to the bathroom).  Associated symptoms include no suicidal ideas.  Past medical history includes anxiety.     Review of Systems  Psychiatric/Behavioral: Positive for depression. Negative for suicidal ideas, hallucinations, memory loss and substance abuse. The patient is nervous/anxious and has insomnia (wakes once a  night to go to the bathroom).     Blood pressure 122/86, pulse 95, temperature 97.2 F (36.2 C), temperature source Tympanic, height 5' 8.5" (1.74 m), weight 235 lb 12.8 oz (106.958 kg), SpO2 96 %.Body mass index is 35.33 kg/(m^2).  General Appearance: Well Groomed  Eye Contact:  Good  Speech:  Normal Rate  Volume:  Normal  Mood:  Depressed and anxious  Affect:  Depressed  Thought Process:  Linear and Logical  Orientation:  Full (Time, Place, and Person)  Thought Content:  Negative  Suicidal Thoughts:  No  Homicidal Thoughts:  No  Memory:  Immediate;   Good Recent;   Good Remote;   Good  Judgement:  Good  Insight:  Good  Psychomotor Activity:  Negative   Concentration:  Good  Recall:  Good  Fund of Knowledge: Good  Language: Good  Akathisia:  Negative  Handed:  Right unknown   AIMS (if indicated): N/A  Assets:  Communication Skills Desire for Improvement Vocational/Educational  ADL's:  Intact  Cognition: WNL  Sleep:  fair   Is the patient at risk to self?  No. Has the patient been a risk to self in the past 6 months?  No. Has the patient been a risk to self within the distant past?  No. Is the patient a risk to others?  No. Has the patient been a risk to others in the past 6 months?  No. Has the patient been a risk to others within the distant past?  No.  Current Medications: Current Outpatient Prescriptions  Medication Sig Dispense Refill  . albuterol (PROVENTIL HFA;VENTOLIN HFA) 108 (90 BASE) MCG/ACT inhaler Inhale 1 puff into the lungs every 4 (four) hours as needed. 1 Inhaler 11  . aspirin 81 MG tablet Take 81 mg by mouth.    . benzonatate (TESSALON) 200 MG capsule Take 1 capsule (200 mg total) by mouth 3 (three) times daily as needed for cough. 30 capsule 0  . Cyanocobalamin (RA VITAMIN B-12 TR) 1000 MCG TBCR Take by mouth.    . DEXILANT 60 MG capsule Take 1 capsule (60 mg total) by mouth daily. 30 capsule 6  . DULoxetine (CYMBALTA) 60 MG capsule Take 1 capsule (60 mg total) by mouth daily. 30 capsule 3  . fluconazole (DIFLUCAN) 150 MG tablet Take one tablet PO once; repeat in 72 hrs if needed. 2 tablet 0  . fluticasone (FLONASE) 50 MCG/ACT nasal spray Place 2 sprays into both nostrils daily. 16 g 11  . Fluticasone-Salmeterol (ADVAIR DISKUS) 250-50 MCG/DOSE AEPB Inhale into the lungs.    . gabapentin (NEURONTIN) 300 MG capsule Take 4 capsules (1,200 mg total) by mouth at bedtime. 120 capsule 6  . levofloxacin (LEVAQUIN) 500 MG tablet Take 1 tablet (500 mg total) by mouth daily. 10 tablet 0  . levothyroxine (SYNTHROID, LEVOTHROID) 50 MCG tablet Take 1 tablet (50 mcg total) by mouth daily before breakfast. 30 tablet 6  .  meloxicam (MOBIC) 15 MG tablet Take 1 tablet (15 mg total) by mouth daily. 30 tablet 6  . naproxen sodium (ANAPROX) 220 MG tablet Take 1 tablet by mouth as needed.    . predniSONE (DELTASONE) 10 MG tablet Take 6 tabs PO on day 1&2, 5 tabs PO on day 3&4, 4 tabs PO on day 5&6, 3 tabs PO on day 7&8, 2 tabs PO on day 9&10, 1 tab PO on day 11&12. 42 tablet 0  . tiZANidine (ZANAFLEX) 4 MG capsule Take 1 capsule (4 mg total) by  mouth 3 (three) times daily. 90 capsule 6  . clonazePAM (KLONOPIN) 0.5 MG tablet Take 0.5 tablets (0.25 mg total) by mouth 2 (two) times daily as needed for anxiety. If 1/2 tablet is not effective can take one whole tablet twice daily. 60 tablet 1  . QUEtiapine (SEROQUEL) 50 MG tablet Take 1 tablet (50 mg total) by mouth at bedtime. 30 tablet 1   No current facility-administered medications for this visit.    Medical Decision Making:  Established Problem, Stable/Improving (1), New Problem, with no additional work-up planned (3), Review of Medication Regimen & Side Effects (2) and Review of New Medication or Change in Dosage (2)  Treatment Plan Summary:Medication management and Plan  Major depressive disorder, recurrent moderate We will continue Cymbalta 60 mg daily. We will add Seroquel 50 mg at bedtime to help her with her complaint of insomnia and augment for depression. Risk and benefits of been discussing patient's able to consent. Have given her a laboratory slip to have fasting metabolic labs done.   Generalized anxiety disorder-we will start some low-dose Klonopin at 0.25 mg twice a day as needed. Patient's been informed she can take 0.5 mg twice a day if the 0.25 is not effective. Risk and benefits of been discussed patient's able consent. She states she's had previous trials of Vistaril and Xanax. However she states that the Xanax made her too sleepy. I did inform her Klonopin could do that if she has problems we can always change her to Vistaril.   Insomnia-Seroquel as  above  Patient will follow up in 1 month. She's been encouraged call any questions concerns prior to her next appointment.   Faith Rogue 06/03/2015, 8:59 AM

## 2015-06-03 NOTE — Patient Instructions (Signed)
HAVE FASTING LABS DONE WITHIN A WEEK.

## 2015-07-03 ENCOUNTER — Encounter: Payer: Self-pay | Admitting: Psychiatry

## 2015-07-03 ENCOUNTER — Ambulatory Visit (INDEPENDENT_AMBULATORY_CARE_PROVIDER_SITE_OTHER): Payer: BLUE CROSS/BLUE SHIELD | Admitting: Psychiatry

## 2015-07-03 VITALS — BP 128/78 | HR 120 | Temp 97.5°F | Ht 68.5 in | Wt 237.8 lb

## 2015-07-03 DIAGNOSIS — F411 Generalized anxiety disorder: Secondary | ICD-10-CM

## 2015-07-03 DIAGNOSIS — G4700 Insomnia, unspecified: Secondary | ICD-10-CM | POA: Diagnosis not present

## 2015-07-03 DIAGNOSIS — F331 Major depressive disorder, recurrent, moderate: Secondary | ICD-10-CM | POA: Diagnosis not present

## 2015-07-03 MED ORDER — QUETIAPINE FUMARATE 50 MG PO TABS: 50.0000 mg | ORAL_TABLET | Freq: Every day | ORAL | Status: DC

## 2015-07-03 MED ORDER — DULOXETINE HCL 60 MG PO CPEP: 60.0000 mg | ORAL_CAPSULE | Freq: Every day | ORAL | Status: DC

## 2015-07-03 NOTE — Progress Notes (Signed)
BH MD/PA/NP OP Progress Note  07/03/2015 9:04 AM Linda Bradford  MRN:  [id number]  Subjective:  Patient returns for follow-up for major depressive disorder recurrent moderate and generalized anxiety disorder and alcohol use disorder in full stay in remission. Patient said she's been taking the Seroquel for insomnia and augmentation of her depression is helped her depression. She states is also helped her insomnia but she is noticing that the sedation from it extends into the morning and throughout the day. She does state that she is taking the gabapentin at bedtime as well and that the 2 together may be causing increased sedation. Her that we could try the sustained release form of Seroquel and see if that decreases her daytime sedation.  She states that she occasionally has panic attacks and episodes of anxiety. She states they've continued unchanged but that the Klonopin has been effective at stopping them. She states she takes a half of a 0.5 mg tablet. Chief Complaint:  Chief Complaint    Follow-up; Medication Refill     Visit Diagnosis:     ICD-9-CM ICD-10-CM   1. Major depressive disorder, recurrent episode, moderate (HCC) 296.32 F33.1   2. GAD (generalized anxiety disorder) 300.02 F41.1   3. Insomnia 780.52 G47.00     Past Medical History:  Past Medical History  Diagnosis Date  . Anxiety   . Asthma   . Depression   . Fatigue   . Headache   . Thyroid disease     Past Surgical History  Procedure Laterality Date  . Tubal ligation    . Appendectomy     Family History:  Family History  Problem Relation Age of Onset  . Lung cancer Mother   . Alcohol abuse Sister   . Arthritis Sister   . Alcohol abuse Brother   . Heart disease Brother   . Heart attack Brother   . Pancreatic cancer Maternal Aunt   . Alcohol abuse Maternal Uncle   . Alcohol abuse Maternal Grandmother   . Alcohol abuse Son   . Drug abuse Son   . Drug abuse Son   . Heart disease Father   . Diabetes Sister    . Breast cancer Brother    Social History:  Social History   Social History  . Marital Status: Married    Spouse Name: N/A  . Number of Children: N/A  . Years of Education: N/A   Social History Main Topics  . Smoking status: Current Every Day Smoker -- 1.50 packs/day    Types: Cigarettes    Start date: 03/02/1980  . Smokeless tobacco: Never Used  . Alcohol Use: No  . Drug Use: No  . Sexual Activity: Yes    Birth Control/ Protection: None   Other Topics Concern  . None   Social History Narrative   Additional History:   Assessment:   Musculoskeletal: Strength & Muscle Tone: within normal limits Gait & Station: normal Patient leans: N/A  Psychiatric Specialty Exam: Anxiety Symptoms include insomnia (improve with Seroquel) and nervous/anxious behavior. Patient reports no suicidal ideas.    Depression        Associated symptoms include insomnia (improve with Seroquel).  Associated symptoms include no suicidal ideas.  Past medical history includes anxiety.     Review of Systems  Psychiatric/Behavioral: Negative for depression, suicidal ideas, hallucinations, memory loss and substance abuse. The patient is nervous/anxious and has insomnia (improve with Seroquel).     Blood pressure 128/78, pulse 120, temperature 97.5 F (36.4 C),  temperature source Tympanic, height 5' 8.5" (1.74 m), weight 237 lb 12.8 oz (107.865 kg), SpO2 95 %.Body mass index is 35.63 kg/(m^2).  General Appearance: Well Groomed  Eye Contact:  Good  Speech:  Normal Rate  Volume:  Normal  Mood:  Better  Affect:  Slightly brighter  Thought Process:  Linear and Logical  Orientation:  Full (Time, Place, and Person)  Thought Content:  Negative  Suicidal Thoughts:  No  Homicidal Thoughts:  No  Memory:  Immediate;   Good Recent;   Good Remote;   Good  Judgement:  Good  Insight:  Good  Psychomotor Activity:  Negative  Concentration:  Good  Recall:  Good  Fund of Knowledge: Good  Language: Good   Akathisia:  Negative  Handed:  Right unknown   AIMS (if indicated): N/A  Assets:  Communication Skills Desire for Improvement Vocational/Educational  ADL's:  Intact  Cognition: WNL  Sleep:  fair   Is the patient at risk to self?  No. Has the patient been a risk to self in the past 6 months?  No. Has the patient been a risk to self within the distant past?  No. Is the patient a risk to others?  No. Has the patient been a risk to others in the past 6 months?  No. Has the patient been a risk to others within the distant past?  No.  Current Medications: Current Outpatient Prescriptions  Medication Sig Dispense Refill  . albuterol (PROVENTIL HFA;VENTOLIN HFA) 108 (90 BASE) MCG/ACT inhaler Inhale 1 puff into the lungs every 4 (four) hours as needed. 1 Inhaler 11  . aspirin 81 MG tablet Take 81 mg by mouth.    . benzonatate (TESSALON) 200 MG capsule Take 1 capsule (200 mg total) by mouth 3 (three) times daily as needed for cough. 30 capsule 0  . clonazePAM (KLONOPIN) 0.5 MG tablet Take 0.5 tablets (0.25 mg total) by mouth 2 (two) times daily as needed for anxiety. If 1/2 tablet is not effective can take one whole tablet twice daily. 60 tablet 1  . Cyanocobalamin (RA VITAMIN B-12 TR) 1000 MCG TBCR Take by mouth.    . DEXILANT 60 MG capsule Take 1 capsule (60 mg total) by mouth daily. 30 capsule 6  . DULoxetine (CYMBALTA) 60 MG capsule Take 1 capsule (60 mg total) by mouth daily. 30 capsule 3  . fluconazole (DIFLUCAN) 150 MG tablet Take one tablet PO once; repeat in 72 hrs if needed. 2 tablet 0  . fluticasone (FLONASE) 50 MCG/ACT nasal spray Place 2 sprays into both nostrils daily. 16 g 11  . Fluticasone-Salmeterol (ADVAIR DISKUS) 250-50 MCG/DOSE AEPB Inhale into the lungs.    . gabapentin (NEURONTIN) 300 MG capsule Take 4 capsules (1,200 mg total) by mouth at bedtime. 120 capsule 6  . levothyroxine (SYNTHROID, LEVOTHROID) 50 MCG tablet Take 1 tablet (50 mcg total) by mouth daily before  breakfast. 30 tablet 6  . meloxicam (MOBIC) 15 MG tablet Take 1 tablet (15 mg total) by mouth daily. 30 tablet 6  . QUEtiapine (SEROQUEL) 50 MG tablet Take 1 tablet (50 mg total) by mouth at bedtime. 30 tablet 1  . tiZANidine (ZANAFLEX) 4 MG capsule Take 1 capsule (4 mg total) by mouth 3 (three) times daily. 90 capsule 6   No current facility-administered medications for this visit.    Medical Decision Making:  Established Problem, Stable/Improving (1), New Problem, with no additional work-up planned (3), Review of Medication Regimen & Side Effects (2) and  Review of New Medication or Change in Dosage (2)  Treatment Plan Summary:Medication management and Plan  Major depressive disorder, recurrent moderate We will continue Cymbalta 60 mg daily and Seroquel 50 mg at bedtime to help her with her complaint of insomnia and augment for depression. I have given patient samples of Seroquel XR 50 mg, 8 of them. She will try those to see if the daytime sedation is less and if so she can contact the clinic and I can change her prescription over to that formulation.  Generalized anxiety disorder-we will start some low-dose Klonopin at 0.25 mg twice a day as needed. Patient's been informed she can take 0.5 mg twice a day if the 0.25 is not effective.  Insomnia-Seroquel as above  I explain patient that I would be departing the clinic in February 2016. I explained her that there should be a replacement at this clinic who she could continue to follow with. Patient will follow up in 1 month. She's been encouraged call any questions concerns prior to her next appointment.   Faith Rogue 07/03/2015, 9:04 AM

## 2015-07-14 ENCOUNTER — Telehealth: Payer: Self-pay | Admitting: Psychiatry

## 2015-07-14 MED ORDER — QUETIAPINE FUMARATE ER 50 MG PO TB24: 50.0000 mg | ORAL_TABLET | Freq: Every day | ORAL | Status: DC

## 2015-07-20 NOTE — Telephone Encounter (Signed)
spoke with patient , pt received rx and everything ok.

## 2015-07-29 NOTE — Telephone Encounter (Signed)
Pharmacy notified.

## 2015-08-04 ENCOUNTER — Ambulatory Visit: Payer: BLUE CROSS/BLUE SHIELD | Admitting: Psychiatry

## 2015-08-14 ENCOUNTER — Encounter: Payer: Self-pay | Admitting: Physician Assistant

## 2015-08-14 ENCOUNTER — Ambulatory Visit (INDEPENDENT_AMBULATORY_CARE_PROVIDER_SITE_OTHER): Payer: BLUE CROSS/BLUE SHIELD | Admitting: Physician Assistant

## 2015-08-14 VITALS — BP 130/70 | HR 88 | Temp 98.2°F | Resp 17 | Ht 68.5 in | Wt 241.6 lb

## 2015-08-14 DIAGNOSIS — J418 Mixed simple and mucopurulent chronic bronchitis: Secondary | ICD-10-CM | POA: Diagnosis not present

## 2015-08-14 DIAGNOSIS — J441 Chronic obstructive pulmonary disease with (acute) exacerbation: Secondary | ICD-10-CM | POA: Diagnosis not present

## 2015-08-14 DIAGNOSIS — T3695XA Adverse effect of unspecified systemic antibiotic, initial encounter: Secondary | ICD-10-CM

## 2015-08-14 DIAGNOSIS — F329 Major depressive disorder, single episode, unspecified: Secondary | ICD-10-CM | POA: Diagnosis not present

## 2015-08-14 DIAGNOSIS — R05 Cough: Secondary | ICD-10-CM

## 2015-08-14 DIAGNOSIS — F32A Depression, unspecified: Secondary | ICD-10-CM

## 2015-08-14 DIAGNOSIS — L821 Other seborrheic keratosis: Secondary | ICD-10-CM

## 2015-08-14 DIAGNOSIS — J209 Acute bronchitis, unspecified: Secondary | ICD-10-CM

## 2015-08-14 DIAGNOSIS — J440 Chronic obstructive pulmonary disease with acute lower respiratory infection: Secondary | ICD-10-CM

## 2015-08-14 DIAGNOSIS — B379 Candidiasis, unspecified: Secondary | ICD-10-CM

## 2015-08-14 DIAGNOSIS — R059 Cough, unspecified: Secondary | ICD-10-CM

## 2015-08-14 MED ORDER — QUETIAPINE FUMARATE ER 50 MG PO TB24: 50.0000 mg | ORAL_TABLET | Freq: Every day | ORAL | Status: DC

## 2015-08-14 MED ORDER — FLUTICASONE-SALMETEROL 250-50 MCG/DOSE IN AEPB: 1.0000 | INHALATION_SPRAY | Freq: Every day | RESPIRATORY_TRACT | Status: DC

## 2015-08-14 MED ORDER — BENZONATATE 200 MG PO CAPS: 200.0000 mg | ORAL_CAPSULE | Freq: Three times a day (TID) | ORAL | Status: DC | PRN

## 2015-08-14 MED ORDER — FLUCONAZOLE 150 MG PO TABS
ORAL_TABLET | ORAL | Status: DC
Start: 2015-08-14 — End: 2015-10-15

## 2015-08-14 MED ORDER — LEVALBUTEROL HCL 1.25 MG/3ML IN NEBU: 1.2500 mg | INHALATION_SOLUTION | Freq: Three times a day (TID) | RESPIRATORY_TRACT | Status: DC | PRN

## 2015-08-14 MED ORDER — DULOXETINE HCL 60 MG PO CPEP: 60.0000 mg | ORAL_CAPSULE | Freq: Every day | ORAL | Status: DC

## 2015-08-14 MED ORDER — HYDROCODONE-HOMATROPINE 5-1.5 MG/5ML PO SYRP: 5.0000 mL | ORAL_SOLUTION | Freq: Three times a day (TID) | ORAL | Status: DC | PRN

## 2015-08-14 MED ORDER — LEVOFLOXACIN 500 MG PO TABS: 500.0000 mg | ORAL_TABLET | Freq: Every day | ORAL | Status: DC

## 2015-08-14 NOTE — Progress Notes (Signed)
Patient: Linda Bradford Female    DOB: 1955-12-07   60 y.o.   MRN: [id number] Visit Date: 08/14/2015  Today's Provider: Mar Daring, PA-C   Chief Complaint  Patient presents with  . Follow-up    Bronchitis and Hypothyroidism   Subjective:    HPI Bronchitis: Patient here for follow-up. Patient complains of chest congestion, chills, post nasal drip, productive cough, shortness of breath, sinus and nasal congestion and wheezing. Symptoms began 3 months ago. Associated symptoms include wheezing. Patient has a history of of chronic bronchitis. Per patient the cough is worst during the night time.    Hypothyroid, follow-up:  No results found for: TSH Wt Readings from Last 3 Encounters:  08/14/15 241 lb 9.6 oz (109.589 kg)  07/03/15 237 lb 12.8 oz (107.865 kg)  06/03/15 235 lb 12.8 oz (106.958 kg)    She was last seen for hypothyroid 3 months ago.  Management since that visit includes none.Patient is on Levothyroxine. She reports good compliance with treatment. She is not having side effects.  She is exercising. (some) She is experiencing feet swelling. Weight trend: fluctuating a bit  ------------------------------------------------------------------------      No Known Allergies Previous Medications   ALBUTEROL (PROVENTIL HFA;VENTOLIN HFA) 108 (90 BASE) MCG/ACT INHALER    Inhale 1 puff into the lungs every 4 (four) hours as needed.   ASPIRIN 81 MG TABLET    Take 81 mg by mouth.   BENZONATATE (TESSALON) 200 MG CAPSULE    Take 1 capsule (200 mg total) by mouth 3 (three) times daily as needed for cough.   CLONAZEPAM (KLONOPIN) 0.5 MG TABLET    Take 0.5 tablets (0.25 mg total) by mouth 2 (two) times daily as needed for anxiety. If 1/2 tablet is not effective can take one whole tablet twice daily.   CYANOCOBALAMIN (RA VITAMIN B-12 TR) 1000 MCG TBCR    Take by mouth.   DEXILANT 60 MG CAPSULE    Take 1 capsule (60 mg total) by mouth daily.   DULOXETINE (CYMBALTA) 60 MG  CAPSULE    Take 1 capsule (60 mg total) by mouth daily.   FLUCONAZOLE (DIFLUCAN) 150 MG TABLET    Take one tablet PO once; repeat in 72 hrs if needed.   FLUTICASONE (FLONASE) 50 MCG/ACT NASAL SPRAY    Place 2 sprays into both nostrils daily.   FLUTICASONE-SALMETEROL (ADVAIR DISKUS) 250-50 MCG/DOSE AEPB    Inhale into the lungs.   GABAPENTIN (NEURONTIN) 300 MG CAPSULE    Take 4 capsules (1,200 mg total) by mouth at bedtime.   LEVOTHYROXINE (SYNTHROID, LEVOTHROID) 50 MCG TABLET    Take 1 tablet (50 mcg total) by mouth daily before breakfast.   MELOXICAM (MOBIC) 15 MG TABLET    Take 1 tablet (15 mg total) by mouth daily.   QUETIAPINE (SEROQUEL XR) 50 MG TB24 24 HR TABLET    Take 1 tablet (50 mg total) by mouth at bedtime.   TIZANIDINE (ZANAFLEX) 4 MG CAPSULE    Take 1 capsule (4 mg total) by mouth 3 (three) times daily.    Review of Systems  Constitutional: Positive for fatigue. Negative for fever and chills.  HENT: Positive for congestion, postnasal drip and rhinorrhea. Negative for ear pain, sinus pressure, sneezing, sore throat, tinnitus, trouble swallowing and voice change.   Eyes: Negative.   Respiratory: Positive for cough, chest tightness, shortness of breath and wheezing.   Cardiovascular: Negative for chest pain, palpitations and leg swelling.  Gastrointestinal:  Positive for nausea (on Wednesday with motion). Negative for vomiting and abdominal pain.  Endocrine: Negative.   Neurological: Negative for dizziness and headaches.    Social History  Substance Use Topics  . Smoking status: Current Every Day Smoker -- 1.50 packs/day    Types: Cigarettes    Start date: 03/02/1980  . Smokeless tobacco: Never Used  . Alcohol Use: No   Objective:   BP 130/70 mmHg  Pulse 88  Temp(Src) 98.2 F (36.8 C) (Oral)  Resp 17  Ht 5' 8.5" (1.74 m)  Wt 241 lb 9.6 oz (109.589 kg)  BMI 36.20 kg/m2  Physical Exam  Constitutional: She appears well-developed and well-nourished. No distress.  HENT:   Head: Normocephalic and atraumatic.  Right Ear: Hearing, tympanic membrane, external ear and ear canal normal.  Left Ear: Hearing, tympanic membrane, external ear and ear canal normal.  Nose: Nose normal. Right sinus exhibits no maxillary sinus tenderness and no frontal sinus tenderness. Left sinus exhibits no maxillary sinus tenderness and no frontal sinus tenderness.  Mouth/Throat: Uvula is midline, oropharynx is clear and moist and mucous membranes are normal. No oropharyngeal exudate, posterior oropharyngeal edema or posterior oropharyngeal erythema.  Eyes: Conjunctivae are normal. Pupils are equal, round, and reactive to light. Right eye exhibits no discharge. Left eye exhibits no discharge. No scleral icterus.  Neck: Normal range of motion. Neck supple. No tracheal deviation present. No thyromegaly present.  Cardiovascular: Normal rate, regular rhythm and normal heart sounds.  Exam reveals no gallop and no friction rub.   No murmur heard. Pulmonary/Chest: Effort normal. No stridor. No respiratory distress. She has wheezes (throughout). She has rhonchi (throughout). She has rales (throughout).    Lymphadenopathy:    She has no cervical adenopathy.  Skin: Skin is warm and dry. She is not diaphoretic.  Vitals reviewed.       Assessment & Plan:     1. Seborrheic keratosis Located on the left under breast and measures approximately 1 cm x 1 cm. She states that it is starting to go from a brownish color to a darker brown to black color on one edge which has her concerned. She used to see Dr. Koleen Nimrod but has not seen a dermatologist since he retired I will make a referral for her to [facility] dermatology for further evaluation of this lesion as well as a full liver skin check. - Ambulatory referral to Dermatology  2. Cough Chronic cough. She has been controlled with [employer] as below. I will refill her Tessalon Perles due to her acute exacerbation she has currently. -  benzonatate (TESSALON) 200 MG capsule; Take 1 capsule (200 mg total) by mouth 3 (three) times daily as needed for cough.  Dispense: 90 capsule; Refill: 0  3. Mixed simple and mucopurulent chronic bronchitis ([employer]) She does have chronic bronchitis and COPD that we have been trying to obtain a spirometry since this has not been done in a while. Unfortunately she has an acute on chronic infection today because it would not be wise to test her. I will refill her Hycodan cough syrup for her nighttime cough. She does continue to have her Advair inhaler and pro-air. She does have a nebulizer at home but does not have any medication. I will refill Xopenex as below for her nebulizer. She is to call the office once she completes her antibiotic therapy if she feels better from the acute on chronic infection and schedule an appointment for at that time we will retest her spirometry. -  HYDROcodone-homatropine (HYCODAN) 5-1.5 MG/5ML syrup; Take 5 mLs by mouth every 8 (eight) hours as needed for cough.  Dispense: 240 mL; Refill: 0 - levalbuterol (XOPENEX) 1.25 MG/3ML nebulizer solution; Take 1.25 mg by nebulization every 8 (eight) hours as needed for wheezing.  Dispense: 72 mL; Refill: 12 - Fluticasone-Salmeterol (ADVAIR DISKUS) 250-50 MCG/DOSE AEPB; Inhale 1 puff into the lungs daily.  Dispense: 60 each; Refill: 6  4. Emphysema with acute on chronic bronchitis Worsening symptoms that she states she has had progressively increased over the last 3 months. She has been trying to treat herself with her inhalers. She does have multiple abnormal lung sounds today on exam. I did offer for her to get a chest x-ray but she states she would like try antibiotic therapy first. I will give her Levaquin as below. I also gave her Xopenex for her nebulizer at home which she may use as well for her shortness of breath and wheezing. She is to call the office if symptoms fail to improve or worsen. - levofloxacin (LEVAQUIN) 500 MG tablet;  Take 1 tablet (500 mg total) by mouth daily.  Dispense: 10 tablet; Refill: 0 - levalbuterol (XOPENEX) 1.25 MG/3ML nebulizer solution; Take 1.25 mg by nebulization every 8 (eight) hours as needed for wheezing.  Dispense: 72 mL; Refill: 12  5. Depression She has been followed by Dr. Jimmye Norman for her depression and mood disorder. She has been well controlled on Seroquel and Cymbalta. She states that Dr. Jimmye Norman is retiring 09/02/2015 and she is not going to be seeing him prior to his retiring. She states that she is going to be in need of her refills on her Seroquel and Cymbalta. She has been stable on these doses for a long period and feels that she would be okay just getting them refilled through me at this time. I agreed and stated that if she is to worsen or if her symptoms are to change needs to let me know so that I may make a referral to get her in with another provider. She agrees with this plan. - QUEtiapine (SEROQUEL XR) 50 MG TB24 24 hr tablet; Take 1 tablet (50 mg total) by mouth at bedtime.  Dispense: 30 tablet; Refill: 6 - DULoxetine (CYMBALTA) 60 MG capsule; Take 1 capsule (60 mg total) by mouth daily.  Dispense: 30 capsule; Refill: 6  6. Antibiotic-induced yeast infection States that she gets yeast infections from Levaquin. I will give her Diflucan as below in case she does develop a yeast infection during the treatment above for her acute on chronic bronchitis. - fluconazole (DIFLUCAN) 150 MG tablet; Take one tablet PO once; repeat in 72 hrs if needed.  Dispense: 2 tablet; Refill: 0       Mar Daring, PA-C  [facility]

## 2015-08-14 NOTE — Patient Instructions (Signed)

## 2015-10-15 ENCOUNTER — Encounter: Payer: Self-pay | Admitting: Physician Assistant

## 2015-10-15 ENCOUNTER — Ambulatory Visit (INDEPENDENT_AMBULATORY_CARE_PROVIDER_SITE_OTHER): Payer: BLUE CROSS/BLUE SHIELD | Admitting: Physician Assistant

## 2015-10-15 VITALS — BP 124/88 | HR 80 | Temp 97.9°F | Resp 16 | Wt 237.8 lb

## 2015-10-15 DIAGNOSIS — J418 Mixed simple and mucopurulent chronic bronchitis: Secondary | ICD-10-CM

## 2015-10-15 DIAGNOSIS — J0100 Acute maxillary sinusitis, unspecified: Secondary | ICD-10-CM | POA: Diagnosis not present

## 2015-10-15 DIAGNOSIS — Z9109 Other allergy status, other than to drugs and biological substances: Secondary | ICD-10-CM

## 2015-10-15 DIAGNOSIS — Z91048 Other nonmedicinal substance allergy status: Secondary | ICD-10-CM

## 2015-10-15 MED ORDER — AMOXICILLIN-POT CLAVULANATE 875-125 MG PO TABS
1.0000 | ORAL_TABLET | Freq: Two times a day (BID) | ORAL | Status: DC
Start: 2015-10-15 — End: 2015-11-11

## 2015-10-15 MED ORDER — PREDNISONE 10 MG PO TABS
ORAL_TABLET | ORAL | Status: DC
Start: 2015-10-15 — End: 2015-11-01

## 2015-10-15 MED ORDER — HYDROCODONE-HOMATROPINE 5-1.5 MG/5ML PO SYRP: 5.0000 mL | ORAL_SOLUTION | Freq: Three times a day (TID) | ORAL | Status: DC | PRN

## 2015-10-15 MED ORDER — FLUTICASONE PROPIONATE 50 MCG/ACT NA SUSP: 2.0000 | Freq: Every day | NASAL | Status: DC

## 2015-10-15 NOTE — Patient Instructions (Signed)
Sinusitis, Adult °Sinusitis is redness, soreness, and inflammation of the paranasal sinuses. Paranasal sinuses are air pockets within the bones of your face. They are located beneath your eyes, in the middle of your forehead, and above your eyes. In healthy paranasal sinuses, mucus is able to drain out, and air is able to circulate through them by way of your nose. However, when your paranasal sinuses are inflamed, mucus and air can become trapped. This can allow bacteria and other germs to grow and cause infection. °Sinusitis can develop quickly and last only a short time (acute) or continue over a long period (chronic). Sinusitis that lasts for more than 12 weeks is considered chronic. °CAUSES °Causes of sinusitis include: °· Allergies. °· Structural abnormalities, such as displacement of the cartilage that separates your nostrils (deviated septum), which can decrease the air flow through your nose and sinuses and affect sinus drainage. °· Functional abnormalities, such as when the small hairs (cilia) that line your sinuses and help remove mucus do not work properly or are not present. °SIGNS AND SYMPTOMS °Symptoms of acute and chronic sinusitis are the same. The primary symptoms are pain and pressure around the affected sinuses. Other symptoms include: °· Upper toothache. °· Earache. °· Headache. °· Bad breath. °· Decreased sense of smell and taste. °· A cough, which worsens when you are lying flat. °· Fatigue. °· Fever. °· Thick drainage from your nose, which often is green and may contain pus (purulent). °· Swelling and warmth over the affected sinuses. °DIAGNOSIS °Your health care provider will perform a physical exam. During your exam, your health care provider may perform any of the following to help determine if you have acute sinusitis or chronic sinusitis: °· Look in your nose for signs of abnormal growths in your nostrils (nasal polyps). °· Tap over the affected sinus to check for signs of  infection. °· View the inside of your sinuses using an imaging device that has a light attached (endoscope). °If your health care provider suspects that you have chronic sinusitis, one or more of the following tests may be recommended: °· Allergy tests. °· Nasal culture. A sample of mucus is taken from your nose, sent to a lab, and screened for bacteria. °· Nasal cytology. A sample of mucus is taken from your nose and examined by your health care provider to determine if your sinusitis is related to an allergy. °TREATMENT °Most cases of acute sinusitis are related to a viral infection and will resolve on their own within 10 days. Sometimes, medicines are prescribed to help relieve symptoms of both acute and chronic sinusitis. These may include pain medicines, decongestants, nasal steroid sprays, or saline sprays. °However, for sinusitis related to a bacterial infection, your health care provider will prescribe antibiotic medicines. These are medicines that will help kill the bacteria causing the infection. °Rarely, sinusitis is caused by a fungal infection. In these cases, your health care provider will prescribe antifungal medicine. °For some cases of chronic sinusitis, surgery is needed. Generally, these are cases in which sinusitis recurs more than 3 times per year, despite other treatments. °HOME CARE INSTRUCTIONS °· Drink plenty of water. Water helps thin the mucus so your sinuses can drain more easily. °· Use a humidifier. °· Inhale steam 3-4 times a day (for example, sit in the bathroom with the shower running). °· Apply a warm, moist washcloth to your face 3-4 times a day, or as directed by your health care provider. °· Use saline nasal sprays to help   moisten and clean your sinuses. °· Take medicines only as directed by your health care provider. °· If you were prescribed either an antibiotic or antifungal medicine, finish it all even if you start to feel better. °SEEK IMMEDIATE MEDICAL CARE IF: °· You have  increasing pain or severe headaches. °· You have nausea, vomiting, or drowsiness. °· You have swelling around your face. °· You have vision problems. °· You have a stiff neck. °· You have difficulty breathing. °  °This information is not intended to replace advice given to you by your health care provider. Make sure you discuss any questions you have with your health care provider. °  °Document Released: 07/18/2005 Document Revised: 08/08/2014 Document Reviewed: 08/02/2011 °Elsevier Interactive Patient Education ©2016 [employer] ° °Acute Bronchitis °Bronchitis is inflammation of the airways that extend from the windpipe into the lungs (bronchi). The inflammation often causes mucus to develop. This leads to a cough, which is the most common symptom of bronchitis.  °In acute bronchitis, the condition usually develops suddenly and goes away over time, usually in a couple weeks. Smoking, allergies, and asthma can make bronchitis worse. Repeated episodes of bronchitis may cause further lung problems.  °CAUSES °Acute bronchitis is most often caused by the same virus that causes a cold. The virus can spread from person to person (contagious) through coughing, sneezing, and touching contaminated objects. °SIGNS AND SYMPTOMS  °· Cough.   °· Fever.   °· Coughing up mucus.   °· Body aches.   °· Chest congestion.   °· Chills.   °· Shortness of breath.   °· Sore throat.   °DIAGNOSIS  °Acute bronchitis is usually diagnosed through a physical exam. Your health care provider will also ask you questions about your medical history. Tests, such as chest X-rays, are sometimes done to rule out other conditions.  °TREATMENT  °Acute bronchitis usually goes away in a couple weeks. Oftentimes, no medical treatment is necessary. Medicines are sometimes given for relief of fever or cough. Antibiotic medicines are usually not needed but may be prescribed in certain situations. In some cases, an inhaler may be recommended to help reduce  shortness of breath and control the cough. A cool mist vaporizer may also be used to help thin bronchial secretions and make it easier to clear the chest.  °HOME CARE INSTRUCTIONS °· Get plenty of rest.   °· Drink enough fluids to keep your urine clear or pale yellow (unless you have a medical condition that requires fluid restriction). Increasing fluids may help thin your respiratory secretions (sputum) and reduce chest congestion, and it will prevent dehydration.   °· Take medicines only as directed by your health care provider. °· If you were prescribed an antibiotic medicine, finish it all even if you start to feel better. °· Avoid smoking and secondhand smoke. Exposure to cigarette smoke or irritating chemicals will make bronchitis worse. If you are a smoker, consider using nicotine gum or skin patches to help control withdrawal symptoms. Quitting smoking will help your lungs heal faster.   °· Reduce the chances of another bout of acute bronchitis by washing your hands frequently, avoiding people with cold symptoms, and trying not to touch your hands to your mouth, nose, or eyes.   °· Keep all follow-up visits as directed by your health care provider.   °SEEK MEDICAL CARE IF: °Your symptoms do not improve after 1 week of treatment.  °SEEK IMMEDIATE MEDICAL CARE IF: °· You develop an increased fever or chills.   °· You have chest pain.   °· You have severe shortness of   breath. °· You have bloody sputum.   °· You develop dehydration. °· You faint or repeatedly feel like you are going to pass out. °· You develop repeated vomiting. °· You develop a severe headache. °MAKE SURE YOU:  °· Understand these instructions. °· Will watch your condition. °· Will get help right away if you are not doing well or get worse. °  °This information is not intended to replace advice given to you by your health care provider. Make sure you discuss any questions you have with your health care provider. °  °Document Released:  08/25/2004 Document Revised: 08/08/2014 Document Reviewed: 01/08/2013 °Elsevier Interactive Patient Education ©2016 [employer] ° °

## 2015-10-15 NOTE — Progress Notes (Signed)
Patient ID: Linda Bradford, female   DOB: 09-Feb-1956, 60 y.o.   MRN: [id number]   Patient: Linda Bradford Female    DOB: 09-23-1955   60 y.o.   MRN: [id number] Visit Date: 10/15/2015  Today's Provider: Mar Daring, PA-C   Chief Complaint  Patient presents with  . Sinusitis   Subjective:    Sinusitis This is a recurrent problem. The current episode started more than 1 month ago. There has been no fever. Associated symptoms include congestion, coughing, sinus pressure and sneezing. Pertinent negatives include no chills, ear pain, headaches or shortness of breath. Treatments tried: allegra, zyrtec. The treatment provided mild relief.  She does have chronic bronchitis. Husband was recently hospitalized last week with pneumonia.    Previous Medications   ALBUTEROL (PROVENTIL HFA;VENTOLIN HFA) 108 (90 BASE) MCG/ACT INHALER    Inhale 1 puff into the lungs every 4 (four) hours as needed.   ASPIRIN 81 MG TABLET    Take 81 mg by mouth.   CLONAZEPAM (KLONOPIN) 0.5 MG TABLET    Take 0.5 tablets (0.25 mg total) by mouth 2 (two) times daily as needed for anxiety. If 1/2 tablet is not effective can take one whole tablet twice daily.   CYANOCOBALAMIN (RA VITAMIN B-12 TR) 1000 MCG TBCR    Take by mouth.   DEXILANT 60 MG CAPSULE    Take 1 capsule (60 mg total) by mouth daily.   DULOXETINE (CYMBALTA) 60 MG CAPSULE    Take 1 capsule (60 mg total) by mouth daily.   FLUTICASONE (FLONASE) 50 MCG/ACT NASAL SPRAY    Place 2 sprays into both nostrils daily.   FLUTICASONE-SALMETEROL (ADVAIR DISKUS) 250-50 MCG/DOSE AEPB    Inhale 1 puff into the lungs daily.   GABAPENTIN (NEURONTIN) 300 MG CAPSULE    Take 4 capsules (1,200 mg total) by mouth at bedtime.   LEVALBUTEROL (XOPENEX) 1.25 MG/3ML NEBULIZER SOLUTION    Take 1.25 mg by nebulization every 8 (eight) hours as needed for wheezing.   LEVOTHYROXINE (SYNTHROID, LEVOTHROID) 50 MCG TABLET    Take 1 tablet (50 mcg total) by mouth daily before breakfast.   MELOXICAM  (MOBIC) 15 MG TABLET    Take 1 tablet (15 mg total) by mouth daily.   QUETIAPINE (SEROQUEL XR) 50 MG TB24 24 HR TABLET    Take 1 tablet (50 mg total) by mouth at bedtime.   TIZANIDINE (ZANAFLEX) 4 MG CAPSULE    Take 1 capsule (4 mg total) by mouth 3 (three) times daily.   No Known Allergies  Review of Systems  Constitutional: Negative.  Negative for fever and chills.  HENT: Positive for congestion, nosebleeds, sinus pressure and sneezing. Negative for ear pain.   Eyes: Negative.   Respiratory: Positive for cough and wheezing. Negative for chest tightness and shortness of breath.   Cardiovascular: Negative.  Negative for chest pain.  Gastrointestinal: Negative.  Negative for nausea, vomiting and abdominal pain.  Endocrine: Negative.   Genitourinary: Negative.   Musculoskeletal: Negative.   Skin: Negative.   Allergic/Immunologic: Negative.   Neurological: Negative.  Negative for dizziness and headaches.  Hematological: Negative.   Psychiatric/Behavioral: Negative.     Social History  Substance Use Topics  . Smoking status: Current Every Day Smoker -- 1.50 packs/day    Types: Cigarettes    Start date: 03/02/1980  . Smokeless tobacco: Never Used  . Alcohol Use: No   Objective:   There were no vitals taken for this visit.  Physical Exam  Constitutional: She  appears well-developed and well-nourished. No distress.  HENT:  Head: Normocephalic and atraumatic.  Right Ear: Hearing, external ear and ear canal normal. Tympanic membrane is not erythematous and not bulging. A middle ear effusion is present.  Left Ear: Hearing, external ear and ear canal normal. Tympanic membrane is not erythematous and not bulging. A middle ear effusion is present.  Nose: Mucosal edema and rhinorrhea present. Right sinus exhibits maxillary sinus tenderness. Right sinus exhibits no frontal sinus tenderness. Left sinus exhibits maxillary sinus tenderness. Left sinus exhibits no frontal sinus tenderness.    Mouth/Throat: Uvula is midline, oropharynx is clear and moist and mucous membranes are normal. No oropharyngeal exudate, posterior oropharyngeal edema or posterior oropharyngeal erythema.  Neck: Normal range of motion. Neck supple. No tracheal deviation present. No thyromegaly present.  Cardiovascular: Normal rate, regular rhythm and normal heart sounds.  Exam reveals no gallop and no friction rub.   No murmur heard. Pulmonary/Chest: Effort normal. No stridor. No respiratory distress. She has no decreased breath sounds. She has wheezes (throughout). She has rhonchi (throughout). She has no rales.  Lymphadenopathy:    She has no cervical adenopathy.  Skin: She is not diaphoretic.  Vitals reviewed.       Assessment & Plan:     1. Environmental allergies Diagnosis pulled for medication refill.  Refilled as below. Continue current medical treatment plan. - fluticasone (FLONASE) 50 MCG/ACT nasal spray; Place 2 sprays into both nostrils daily.  Dispense: 16 g; Refill: 11  2. Mixed simple and mucopurulent chronic bronchitis (HCC) Worsening symptoms.  Will give hycodan cough syrup for nighttime cough.  Will give prednisone since she has not responded to her inhaler or nebulizer treatments.  She is to call if symptoms do not improve or worsen. - HYDROcodone-homatropine (HYCODAN) 5-1.5 MG/5ML syrup; Take 5 mLs by mouth every 8 (eight) hours as needed for cough.  Dispense: 240 mL; Refill: 0 - predniSONE (DELTASONE) 10 MG tablet; Take 6 tabs PO on day 1&2, 5 tabs PO on day 3&4, 4 tabs PO on day 5&6, 3 tabs PO on day 7&8, 2 tabs PO on day 9&10, 1 tab PO on day 11&12.  Dispense: 42 tablet; Refill: 0  3. Acute maxillary sinusitis, recurrence not specified Worsening symptoms that has not responded to OTC treatments for one month.  Will give augmentin as below. Stay well hydrated and get plenty of rest. She is to call if symptoms fail to improve or worsen. - amoxicillin-clavulanate (AUGMENTIN) 875-125 MG  tablet; Take 1 tablet by mouth 2 (two) times daily.  Dispense: 20 tablet; Refill: 0   Follow up: No Follow-up on file.

## 2015-10-29 ENCOUNTER — Telehealth: Payer: Self-pay | Admitting: Physician Assistant

## 2015-10-29 DIAGNOSIS — B379 Candidiasis, unspecified: Secondary | ICD-10-CM

## 2015-10-29 DIAGNOSIS — T3695XA Adverse effect of unspecified systemic antibiotic, initial encounter: Principal | ICD-10-CM

## 2015-10-29 MED ORDER — NYSTATIN 100000 UNIT/ML MT SUSP: 5.0000 mL | Freq: Four times a day (QID) | OROMUCOSAL | Status: DC

## 2015-10-29 MED ORDER — FLUCONAZOLE 150 MG PO TABS: 150.0000 mg | ORAL_TABLET | Freq: Once | ORAL | Status: DC

## 2015-10-29 NOTE — Telephone Encounter (Signed)
Will send in diflucan and nystatin susp for yeast/thrush. Is she taking her allergy medications?

## 2015-10-29 NOTE — Telephone Encounter (Signed)
Please advise.  Thanks,  -Chardai Gangemi 

## 2015-10-29 NOTE — Telephone Encounter (Signed)
Patient advised as directed below. Per patient she is taking her allergy medication.

## 2015-10-29 NOTE — Telephone Encounter (Signed)
Have her continue symptomatic relief and seasonal allergy medication and to call if symptoms persists over the next week, or if she is to develop fevers with the runny nose and cough.

## 2015-10-29 NOTE — Telephone Encounter (Signed)
Pt states she was in our office on 10/15/2015 for sinus congestion.  Pt states she finished all of her medication and 2 day ago she started having a cough and runny nose.  Pt is requesting a new Rx to help with this.  Pt also states she is having burning and itching in her mouth and vaginal area.  [facility].  [id number]

## 2015-10-29 NOTE — Telephone Encounter (Signed)
Patient advised as directed below.  Thanks,  -Joseline 

## 2015-10-30 ENCOUNTER — Other Ambulatory Visit: Payer: Self-pay | Admitting: Physician Assistant

## 2015-10-30 DIAGNOSIS — F329 Major depressive disorder, single episode, unspecified: Secondary | ICD-10-CM

## 2015-10-30 DIAGNOSIS — F32A Depression, unspecified: Secondary | ICD-10-CM

## 2015-10-30 MED ORDER — DULOXETINE HCL 60 MG PO CPEP: 60.0000 mg | ORAL_CAPSULE | Freq: Two times a day (BID) | ORAL | Status: DC

## 2015-11-01 ENCOUNTER — Emergency Department
Admission: EM | Admit: 2015-11-01 | Discharge: 2015-11-01 | Disposition: A | Discharge status: Home / Self Care | Payer: BLUE CROSS/BLUE SHIELD | Attending: Student | Admitting: Student

## 2015-11-01 ENCOUNTER — Emergency Department: Payer: BLUE CROSS/BLUE SHIELD

## 2015-11-01 ENCOUNTER — Encounter: Payer: Self-pay | Admitting: Emergency Medicine

## 2015-11-01 DIAGNOSIS — M1990 Unspecified osteoarthritis, unspecified site: Secondary | ICD-10-CM | POA: Diagnosis not present

## 2015-11-01 DIAGNOSIS — Z85828 Personal history of other malignant neoplasm of skin: Secondary | ICD-10-CM | POA: Insufficient documentation

## 2015-11-01 DIAGNOSIS — F321 Major depressive disorder, single episode, moderate: Secondary | ICD-10-CM | POA: Insufficient documentation

## 2015-11-01 DIAGNOSIS — J45909 Unspecified asthma, uncomplicated: Secondary | ICD-10-CM | POA: Diagnosis not present

## 2015-11-01 DIAGNOSIS — J441 Chronic obstructive pulmonary disease with (acute) exacerbation: Secondary | ICD-10-CM | POA: Diagnosis not present

## 2015-11-01 DIAGNOSIS — Z9851 Tubal ligation status: Secondary | ICD-10-CM | POA: Diagnosis not present

## 2015-11-01 DIAGNOSIS — R0602 Shortness of breath: Secondary | ICD-10-CM

## 2015-11-01 DIAGNOSIS — F17210 Nicotine dependence, cigarettes, uncomplicated: Secondary | ICD-10-CM | POA: Diagnosis not present

## 2015-11-01 DIAGNOSIS — E039 Hypothyroidism, unspecified: Secondary | ICD-10-CM | POA: Insufficient documentation

## 2015-11-01 LAB — CBC WITH DIFFERENTIAL/PLATELET
Basophils Absolute: 0 10*3/uL (ref 0–0.1)
Basophils Relative: 1 %
Eosinophils Absolute: 0.2 10*3/uL (ref 0–0.7)
Eosinophils Relative: 2 %
HCT: 41 % (ref 35.0–47.0)
Hemoglobin: 13.7 g/dL (ref 12.0–16.0)
Lymphocytes Relative: 35 %
Lymphs Abs: 2.5 10*3/uL (ref 1.0–3.6)
MCH: 29.3 pg (ref 26.0–34.0)
MCHC: 33.4 g/dL (ref 32.0–36.0)
MCV: 87.8 fL (ref 80.0–100.0)
Monocytes Absolute: 0.5 10*3/uL (ref 0.2–0.9)
Monocytes Relative: 7 %
Neutro Abs: 4 10*3/uL (ref 1.4–6.5)
Neutrophils Relative %: 55 %
Platelets: 317 10*3/uL (ref 150–440)
RBC: 4.67 MIL/uL (ref 3.80–5.20)
RDW: 13.9 % (ref 11.5–14.5)
WBC: 7.1 10*3/uL (ref 3.6–11.0)

## 2015-11-01 LAB — BASIC METABOLIC PANEL
Anion gap: 4 — ABNORMAL LOW (ref 5–15)
BUN: 9 mg/dL (ref 6–20)
CO2: 26 mmol/L (ref 22–32)
Calcium: 9 mg/dL (ref 8.9–10.3)
Chloride: 105 mmol/L (ref 101–111)
Creatinine, Ser: 0.58 mg/dL (ref 0.44–1.00)
GFR calc Af Amer: >60 mL/min (ref >60)
GFR calc non Af Amer: >60 mL/min (ref >60)
Glucose, Bld: 145 mg/dL — ABNORMAL HIGH (ref 65–99)
Potassium: 3.8 mmol/L (ref 3.5–5.1)
Sodium: 135 mmol/L (ref 135–145)

## 2015-11-01 LAB — TROPONIN I: Troponin I: <0.03 ng/mL (ref <0.031)

## 2015-11-01 MED ORDER — PREDNISONE 20 MG PO TABS
60.0000 mg | ORAL_TABLET | Freq: Once | ORAL | Status: AC
  Administered 2015-11-01: 60 mg via ORAL
  Filled 2015-11-01: qty 3

## 2015-11-01 MED ORDER — PREDNISONE 20 MG PO TABS: 60.0000 mg | ORAL_TABLET | Freq: Every day | ORAL | Status: DC

## 2015-11-01 MED ORDER — AZITHROMYCIN 250 MG PO TABS: ORAL_TABLET | ORAL | Status: AC

## 2015-11-01 MED ORDER — IPRATROPIUM-ALBUTEROL 0.5-2.5 (3) MG/3ML IN SOLN
3.0000 mL | Freq: Once | RESPIRATORY_TRACT | Status: AC
  Administered 2015-11-01: 3 mL via RESPIRATORY_TRACT
  Filled 2015-11-01: qty 3

## 2015-11-01 MED ORDER — AZITHROMYCIN 500 MG PO TABS
500.0000 mg | ORAL_TABLET | Freq: Once | ORAL | Status: AC
  Administered 2015-11-01: 500 mg via ORAL
  Filled 2015-11-01: qty 1

## 2015-11-01 NOTE — ED Notes (Signed)
Patient from home ambulatory in triage (via POV) with complaint of ShOB x3 days with congestion for several months. Patient recently completed regimen of steroids and antibiotics for same. Hx COPD. Patient alert and speaking in complete sentences without respiratory difficulty.

## 2015-11-01 NOTE — ED Notes (Signed)
Pt ambulated with walker. Maintained oxygen saturation of 94%.

## 2015-11-01 NOTE — ED Provider Notes (Addendum)
[facility] Emergency Department Provider Note  ____________________________________________  Time seen: Approximately 2:18 PM  I have reviewed the triage vital signs and the nursing notes.   HISTORY  Chief Complaint No chief complaint on file.    HPI Linda Bradford is a 60 y.o. female she has COPD, anxiety, heart disease presents for evaluation of 2 days shortness of breath with wheezing, gradual onset, constant since onset, intermittent, improves with nebulizer treatments, worse when she exerts herself. She has had cough which is productive of clear phlegm. No fevers. No chest pain. No leg swelling. No syncope. Patient was seen by her primary care doctor on 10/15/2015 for symptoms of sinusitis as well as COPD. She was described amoxicillin as well as prednisone taper reports that her symptoms had significantly improved. She reports that 2 days ago "it all flared up again". She is having some runny nose and nasal congestion as well as a cough and shortness of breath. No vomiting or diarrhea. No abdominal pain. No history of coronary artery disease, no history of DVT or PE.   Past Medical History  Diagnosis Date  . Anxiety   . Asthma   . Depression   . Fatigue   . Headache   . Thyroid disease     Patient Active Problem List   Diagnosis Date Noted  . Chronic bronchitis ([place]) 05/13/2015  . Bronchitis, chronic ([place]) 03/03/2015  . Depression, major, recurrent, moderate ([place]) 11/05/2014  . Anxiety, generalized 11/05/2014  . Alcohol abuse, in remission 11/05/2014  . Nicotine addiction 11/05/2014  . Restless leg 11/05/2014  . H/O bronchitis 11/05/2014  . H/O: osteoarthritis 11/05/2014  . Fibromyalgia 11/05/2014  . H/O: hypothyroidism 11/05/2014  . Restless leg syndrome 11/05/2014  . Arthralgia of hip 01/13/2014  . Ache in joint 01/13/2014  . Gonalgia 01/13/2014  . Adiposity 01/13/2014  . Disordered sleep 01/13/2014  . Airway hyperreactivity 12/17/2013  .  Clinical depression 12/17/2013  . Adult hypothyroidism 12/17/2013  . Compulsive tobacco user syndrome 12/17/2013  . Current tobacco use 12/17/2013  . Basal cell carcinoma 11/04/2013  . Arthropathia 02/08/2012    Past Surgical History  Procedure Laterality Date  . Tubal ligation    . Appendectomy      Current Outpatient Rx  Name  Route  Sig  Dispense  Refill  . albuterol (PROVENTIL HFA;VENTOLIN HFA) 108 (90 BASE) MCG/ACT inhaler   Inhalation   Inhale 1 puff into the lungs every 4 (four) hours as needed.   1 Inhaler   11   . amoxicillin-clavulanate (AUGMENTIN) 875-125 MG tablet   Oral   Take 1 tablet by mouth 2 (two) times daily.   20 tablet   0   . aspirin 81 MG tablet   Oral   Take 81 mg by mouth.         Marland Kitchen azithromycin (ZITHROMAX Z-PAK) 250 MG tablet      Take 1 tablet by mouth daily for 4 days starting on 11/02/2015.   4 each   0   . clonazePAM (KLONOPIN) 0.5 MG tablet   Oral   Take 0.5 tablets (0.25 mg total) by mouth 2 (two) times daily as needed for anxiety. If 1/2 tablet is not effective can take one whole tablet twice daily.   60 tablet   1   . Cyanocobalamin (RA VITAMIN B-12 TR) 1000 MCG TBCR   Oral   Take by mouth.         . DEXILANT 60 MG capsule  Oral   Take 1 capsule (60 mg total) by mouth daily.   30 capsule   6     Dispense as written.   . DULoxetine (CYMBALTA) 60 MG capsule   Oral   Take 1 capsule (60 mg total) by mouth 2 (two) times daily.   60 capsule   6   . fluconazole (DIFLUCAN) 150 MG tablet   Oral   Take 1 tablet (150 mg total) by mouth once.   1 tablet   0   . fluticasone (FLONASE) 50 MCG/ACT nasal spray   Each Nare   Place 2 sprays into both nostrils daily.   16 g   11   . Fluticasone-Salmeterol (ADVAIR DISKUS) 250-50 MCG/DOSE AEPB   Inhalation   Inhale 1 puff into the lungs daily.   60 each   6   . gabapentin (NEURONTIN) 300 MG capsule   Oral   Take 4 capsules (1,200 mg total) by mouth at bedtime.   120  capsule   6   . HYDROcodone-homatropine (HYCODAN) 5-1.5 MG/5ML syrup   Oral   Take 5 mLs by mouth every 8 (eight) hours as needed for cough.   240 mL   0   . levalbuterol (XOPENEX) 1.25 MG/3ML nebulizer solution   Nebulization   Take 1.25 mg by nebulization every 8 (eight) hours as needed for wheezing.   72 mL   12   . levothyroxine (SYNTHROID, LEVOTHROID) 50 MCG tablet   Oral   Take 1 tablet (50 mcg total) by mouth daily before breakfast.   30 tablet   6   . meloxicam (MOBIC) 15 MG tablet   Oral   Take 1 tablet (15 mg total) by mouth daily.   30 tablet   6   . nystatin (MYCOSTATIN) 100000 UNIT/ML suspension   Oral   Take 5 mLs (500,000 Units total) by mouth 4 (four) times daily.   60 mL   0   . predniSONE (DELTASONE) 20 MG tablet   Oral   Take 3 tablets (60 mg total) by mouth daily.   12 tablet   0   . QUEtiapine (SEROQUEL XR) 50 MG TB24 24 hr tablet   Oral   Take 1 tablet (50 mg total) by mouth at bedtime.   30 tablet   6   . tiZANidine (ZANAFLEX) 4 MG capsule   Oral   Take 1 capsule (4 mg total) by mouth 3 (three) times daily.   90 capsule   6     Allergies Review of patient's allergies indicates no known allergies.  Family History  Problem Relation Age of Onset  . Lung cancer Mother   . Alcohol abuse Sister   . Arthritis Sister   . Alcohol abuse Brother   . Heart disease Brother   . Heart attack Brother   . Pancreatic cancer Maternal Aunt   . Alcohol abuse Maternal Uncle   . Alcohol abuse Maternal Grandmother   . Alcohol abuse Son   . Drug abuse Son   . Drug abuse Son   . Heart disease Father   . Diabetes Sister   . Breast cancer Brother     Social History Social History  Substance Use Topics  . Smoking status: Current Every Day Smoker -- 1.50 packs/day    Types: Cigarettes    Start date: 03/02/1980  . Smokeless tobacco: Never Used  . Alcohol Use: No    Review of Systems Constitutional: No fever/chills Eyes: No visual  changes. ENT: No sore throat. Cardiovascular: Denies chest pain. Respiratory: +shortness of breath. Gastrointestinal: No abdominal pain.  No nausea, no vomiting.  No diarrhea.  No constipation. Genitourinary: Negative for dysuria. Musculoskeletal: Negative for back pain. Skin: Negative for rash. Neurological: Negative for headaches, focal weakness or numbness.  10-point ROS otherwise negative.  ____________________________________________   PHYSICAL EXAM:  Filed Vitals:   11/01/15 1326 11/01/15 1400  BP: 128/62 134/67  Pulse: 91 87  Temp: 98.2 F (36.8 C)   TempSrc: Oral   Resp: 22 18  Height: 5\' 9"  (1.753 m)   Weight: 235 lb (106.595 kg)   SpO2: 93% 95%    VITAL SIGNS: ED Triage Vitals  Enc Vitals Group     BP 11/01/15 1326 128/62 mmHg     Pulse Rate 11/01/15 1326 91     Resp 11/01/15 1326 22     Temp 11/01/15 1326 98.2 F (36.8 C)     Temp Source 11/01/15 1326 Oral     SpO2 11/01/15 1326 93 %     Weight 11/01/15 1326 235 lb (106.595 kg)     Height 11/01/15 1326 5\' 9"  (1.753 m)     Head Cir --      Peak Flow --      Pain Score --      Pain Loc --      Pain Edu? --      Excl. in [place]? --     Constitutional: Alert and oriented. Well appearing and in no acute distress. +frequent cough. Eyes: Conjunctivae are normal. PERRL. EOMI. Head: Atraumatic. Nose: + congestion Mouth/Throat: Mucous membranes are moist.  Oropharynx non-erythematous. Neck: No stridor. Supple without meningismus. Cardiovascular: Normal rate, regular rhythm. Grossly normal heart sounds.  Good peripheral circulation. Respiratory: Mild tachypnea but speaking in complete sentences. Rhonchorous breath sounds throughout all lung fields with a faint expiratory wheeze in the right. Moderate air movement. Gastrointestinal: Soft and nontender. No distention.  No CVA tenderness. Genitourinary: deferred Musculoskeletal: No lower extremity tenderness nor edema.  No joint effusions. No calf swelling,  asymmetry or tenderness. Neurologic:  Normal speech and language. No gross focal neurologic deficits are appreciated. No gait instability. Skin:  Skin is warm, dry and intact. No rash noted. Psychiatric: Mood and affect are normal. Speech and behavior are normal.  ____________________________________________   LABS (all labs ordered are listed, but only abnormal results are displayed)  Labs Reviewed  BASIC METABOLIC PANEL - Abnormal; Notable for the following:    Glucose, Bld 145 (*)    Anion gap 4 (*)    All other components within normal limits  CBC WITH DIFFERENTIAL/PLATELET  TROPONIN I   ____________________________________________  EKG  ED ECG REPORT I, Joanne Gavel, the attending physician, personally viewed and interpreted this ECG.   Date: 11/01/2015  EKG Time: 13:42  Rate: 90  Rhythm: normal EKG, normal sinus rhythm  Axis: normal  Intervals:none  ST&T Change: No acute ST elevation.  ____________________________________________  RADIOLOGY  CXR IMPRESSION: Hyperinflation without acute infiltrate. ____________________________________________   PROCEDURES  Procedure(s) performed: None  Critical Care performed: No  ____________________________________________   INITIAL IMPRESSION / ASSESSMENT AND PLAN / ED COURSE  Pertinent labs & imaging results that were available during my care of the patient were reviewed by me and considered in my medical decision making (see chart for details).  Linda Bradford is a 60 y.o. female she has COPD, anxiety, heart disease presents for evaluation of 2 days shortness of breath with wheezing, gradual  onset, constant since onset, intermittent, improves with nebulizer treatments. On exam, she is nontoxic appearing, sitting up in bed. She is mildly tachypneic with diffusely rhonchorous breath sounds and a faint wheeze, moderate air movement consistent with COPD exacerbation. EKG reviewed and is not consistent with ischemia. Doubt  ACS, PE or acute aortic dissection. We'll give DuoNeb treatments, azithromycin, prednisone, obtain screening labs and chest x-ray and reassess for disposition.  ----------------------------------------- 3:48 PM on 11/01/2015 -----------------------------------------  Chest x-ray shows hyperinflation without infiltrate. CBC, BMP, troponin unremarkable. Patient with improved air movement and diminished wheezing after the above treatments. She ambulates well with O2 saturation 94%. She is no longer tachypneic. She reports she feels much better after DuoNebs. We discussed return precautions, need for close PCP follow-up and she is comfortable with the discharge plan. DC home prednisone and azithromycin. ____________________________________________   FINAL CLINICAL IMPRESSION(S) / ED DIAGNOSES  Final diagnoses:  SOB (shortness of breath)  Chronic obstructive pulmonary disease with acute exacerbation ([place])      Joanne Gavel, MD 11/01/15 1549  Joanne Gavel, MD 11/01/15 1555

## 2015-11-04 ENCOUNTER — Encounter: Payer: Self-pay | Admitting: Physician Assistant

## 2015-11-04 ENCOUNTER — Ambulatory Visit (INDEPENDENT_AMBULATORY_CARE_PROVIDER_SITE_OTHER): Payer: BLUE CROSS/BLUE SHIELD | Admitting: Physician Assistant

## 2015-11-04 VITALS — BP 140/80 | HR 90 | Temp 98.2°F | Resp 18 | Wt 233.6 lb

## 2015-11-04 DIAGNOSIS — F419 Anxiety disorder, unspecified: Secondary | ICD-10-CM | POA: Diagnosis not present

## 2015-11-04 DIAGNOSIS — K1379 Other lesions of oral mucosa: Secondary | ICD-10-CM | POA: Diagnosis not present

## 2015-11-04 DIAGNOSIS — Z716 Tobacco abuse counseling: Secondary | ICD-10-CM | POA: Diagnosis not present

## 2015-11-04 DIAGNOSIS — Z720 Tobacco use: Secondary | ICD-10-CM | POA: Diagnosis not present

## 2015-11-04 DIAGNOSIS — N952 Postmenopausal atrophic vaginitis: Secondary | ICD-10-CM

## 2015-11-04 DIAGNOSIS — J441 Chronic obstructive pulmonary disease with (acute) exacerbation: Secondary | ICD-10-CM | POA: Diagnosis not present

## 2015-11-04 DIAGNOSIS — J302 Other seasonal allergic rhinitis: Secondary | ICD-10-CM | POA: Diagnosis not present

## 2015-11-04 MED ORDER — IPRATROPIUM-ALBUTEROL 0.5-2.5 (3) MG/3ML IN SOLN: 3.0000 mL | Freq: Four times a day (QID) | RESPIRATORY_TRACT | Status: DC | PRN

## 2015-11-04 MED ORDER — MAGIC MOUTHWASH W/LIDOCAINE
5.0000 mL | Freq: Three times a day (TID) | ORAL | Status: DC | PRN
Start: 2015-11-04 — End: 2016-04-25

## 2015-11-04 MED ORDER — TIOTROPIUM BROMIDE MONOHYDRATE 18 MCG IN CAPS: 18.0000 ug | ORAL_CAPSULE | Freq: Every day | RESPIRATORY_TRACT | Status: DC

## 2015-11-04 MED ORDER — NICOTINE 21 MG/24HR TD PT24: 21.0000 mg | MEDICATED_PATCH | Freq: Every day | TRANSDERMAL | Status: DC

## 2015-11-04 MED ORDER — THEOPHYLLINE ER 100 MG PO TB12: 100.0000 mg | ORAL_TABLET | Freq: Two times a day (BID) | ORAL | Status: DC

## 2015-11-04 MED ORDER — CLONAZEPAM 0.5 MG PO TABS: 0.2500 mg | ORAL_TABLET | Freq: Two times a day (BID) | ORAL | Status: DC | PRN

## 2015-11-04 MED ORDER — FEXOFENADINE-PSEUDOEPHED ER 180-240 MG PO TB24: 1.0000 | ORAL_TABLET | Freq: Every day | ORAL | Status: DC

## 2015-11-04 MED ORDER — ESTRADIOL 0.1 MG/GM VA CREA: 1.0000 | TOPICAL_CREAM | Freq: Every day | VAGINAL | Status: DC

## 2015-11-04 MED ORDER — NICOTINE 14 MG/24HR TD PT24: 14.0000 mg | MEDICATED_PATCH | Freq: Every day | TRANSDERMAL | Status: DC

## 2015-11-04 MED ORDER — VARENICLINE TARTRATE 0.5 MG X 11 & 1 MG X 42 PO MISC: ORAL | Status: DC

## 2015-11-04 MED ORDER — NICOTINE 7 MG/24HR TD PT24: 7.0000 mg | MEDICATED_PATCH | Freq: Every day | TRANSDERMAL | Status: DC

## 2015-11-04 NOTE — Progress Notes (Signed)
Patient: Linda Bradford Female    DOB: [birth year]   60 y.o.   MRN: [id number] Visit Date: 11/04/2015  Today's Provider: Mar Daring, PA-C   Chief Complaint  Patient presents with  . Follow-up    [facility] ER  . Vaginal Itching   Subjective:    HPI  Follow Up ER Visit  Patient is here for ER follow up.  She was recently seen at [facility] for SOB on 11/01/15. Treatment for this included: DuoNeb at  the ER visit. Discharge with Azithromycin and Prednisone. She reports good compliance with treatment.She is also taking Allegra-D. She reports this condition is Improved, but not 100%. Labs were obtained. CXR-Hyperinflation without acute infiltrate. EKG-Normal  Vaginitis: Patient complains of an abnormal vaginal itching 1 week. Vaginal symptoms include burning and discharge described as clear.She says that is swollen and irritated.Last week she was called in [place].She did good for a few weeks.Other associated symptoms: burning and local irritation.  She is also asking for a refill on her klonopin. She uses this for the shaking and anxiety caused by prednisone and inhalers.   She also is interested in trying to quit smoking. She has been smoking 1-2 PPD since she was 60 yrs old, with exception of 18 months where she had quit smoking. She did quit during that time with chantix. She states she was able to take chantix x 1 month but had to discontinue due to mouth sores.     No Known Allergies Previous Medications   ALBUTEROL (PROVENTIL HFA;VENTOLIN HFA) 108 (90 BASE) MCG/ACT INHALER    Inhale 1 puff into the lungs every 4 (four) hours as needed.   AMOXICILLIN-CLAVULANATE (AUGMENTIN) 875-125 MG TABLET    Take 1 tablet by mouth 2 (two) times daily.   ASPIRIN 81 MG TABLET    Take 81 mg by mouth.   AZITHROMYCIN (ZITHROMAX Z-PAK) 250 MG TABLET    Take 1 tablet by mouth daily for 4 days starting on 11/02/2015.   CLONAZEPAM (KLONOPIN) 0.5 MG TABLET    Take 0.5 tablets (0.25 mg total) by mouth 2  (two) times daily as needed for anxiety. If 1/2 tablet is not effective can take one whole tablet twice daily.   CYANOCOBALAMIN (RA VITAMIN B-12 TR) 1000 MCG TBCR    Take by mouth.   DEXILANT 60 MG CAPSULE    Take 1 capsule (60 mg total) by mouth daily.   DULOXETINE (CYMBALTA) 60 MG CAPSULE    Take 1 capsule (60 mg total) by mouth 2 (two) times daily.   FLUCONAZOLE (DIFLUCAN) 150 MG TABLET    Take 1 tablet (150 mg total) by mouth once.   FLUTICASONE (FLONASE) 50 MCG/ACT NASAL SPRAY    Place 2 sprays into both nostrils daily.   FLUTICASONE-SALMETEROL (ADVAIR DISKUS) 250-50 MCG/DOSE AEPB    Inhale 1 puff into the lungs daily.   GABAPENTIN (NEURONTIN) 300 MG CAPSULE    Take 4 capsules (1,200 mg total) by mouth at bedtime.   HYDROCODONE-HOMATROPINE (HYCODAN) 5-1.5 MG/5ML SYRUP    Take 5 mLs by mouth every 8 (eight) hours as needed for cough.   LEVALBUTEROL (XOPENEX) 1.25 MG/3ML NEBULIZER SOLUTION    Take 1.25 mg by nebulization every 8 (eight) hours as needed for wheezing.   LEVOTHYROXINE (SYNTHROID, LEVOTHROID) 50 MCG TABLET    Take 1 tablet (50 mcg total) by mouth daily before breakfast.   MELOXICAM (MOBIC) 15 MG TABLET    Take 1 tablet (15 mg total)  by mouth daily.   NYSTATIN (MYCOSTATIN) 100000 UNIT/ML SUSPENSION    Take 5 mLs (500,000 Units total) by mouth 4 (four) times daily.   PREDNISONE (DELTASONE) 20 MG TABLET    Take 3 tablets (60 mg total) by mouth daily.   QUETIAPINE (SEROQUEL XR) 50 MG TB24 24 HR TABLET    Take 1 tablet (50 mg total) by mouth at bedtime.   TIZANIDINE (ZANAFLEX) 4 MG CAPSULE    Take 1 capsule (4 mg total) by mouth 3 (three) times daily.    Review of Systems  Constitutional: Positive for fatigue. Negative for fever and chills.  HENT: Positive for congestion and rhinorrhea.   Respiratory: Positive for cough, shortness of breath and wheezing. Negative for chest tightness.   Cardiovascular: Negative for chest pain.  Gastrointestinal: Negative.   Genitourinary: Positive  for vaginal pain (from the irritation.). Negative for dysuria, urgency, frequency and pelvic pain.  Neurological: Negative.   Psychiatric/Behavioral: The patient is nervous/anxious.     Social History  Substance Use Topics  . Smoking status: Current Every Day Smoker -- 1.50 packs/day    Types: Cigarettes    Start date: 03/02/1980  . Smokeless tobacco: Never Used  . Alcohol Use: No   Objective:   BP 140/80 mmHg  Pulse 90  Temp(Src) 98.2 F (36.8 C) (Oral)  Resp 18  Wt 233 lb 9.6 oz (105.96 kg)  SpO2 92%  Physical Exam  Constitutional: She appears well-developed and well-nourished. No distress.  Cardiovascular: Normal rate, regular rhythm and normal heart sounds.  Exam reveals no gallop and no friction rub.   No murmur heard. Pulmonary/Chest: Effort normal. No accessory muscle usage. No respiratory distress. She has no decreased breath sounds. She has wheezes. She has rhonchi. She has no rales. She exhibits no tenderness.  Genitourinary: Pelvic exam was performed with patient supine. There is rash (erythematous and dry) and tenderness on the right labia. There is no lesion or injury on the right labia. There is rash (erythematous and dry) and tenderness on the left labia. There is no lesion or injury on the left labia. There is erythema and tenderness in the vagina. No signs of injury around the vagina. No vaginal discharge found.  Skin: She is not diaphoretic.  Vitals reviewed.       Assessment & Plan:     1. Other seasonal allergic rhinitis Stable. Medication refilled as below. - fexofenadine-pseudoephedrine (ALLEGRA-D ALLERGY & CONGESTION) 180-240 MG 24 hr tablet; Take 1 tablet by mouth daily.  Dispense: 30 tablet; Refill: 3  2. Mouth sores She does not have currently but did get the last time she used chantix. Will give magic mouthwash as below to help with mouth sores if they develop since we are starting chantix as below for smoking cessation. - magic mouthwash  w/lidocaine SOLN; Take 5 mLs by mouth 3 (three) times daily as needed for mouth pain.  Dispense: 120 mL; Refill: 0  3. Encounter for smoking cessation counseling Chantix given as well as 3 step nicotine patches for smoking cessation. Discussed online referrals and encouraging websites to help with smoking cessation. I will see her back in 4 weeks to see how she is doing with smoking cessation. She is to call if she needs any assistance in the meantime.  - varenicline (CHANTIX STARTING MONTH PAK) 0.5 MG X 11 & 1 MG X 42 tablet; Take one 0.5 mg tablet by mouth once daily for 3 days, then increase to one 0.5 mg tablet twice daily  for 4 days, then increase to one 1 mg tablet twice daily.  Dispense: 53 tablet; Refill: 0 - nicotine (NICOTINE STEP 1) 21 mg/24hr patch; Place 1 patch (21 mg total) onto the skin daily.  Dispense: 28 patch; Refill: 0 - nicotine (NICOTINE STEP 2) 14 mg/24hr patch; Place 1 patch (14 mg total) onto the skin daily.  Dispense: 28 patch; Refill: 0 - nicotine (NICOTINE STEP 3) 7 mg/24hr patch; Place 1 patch (7 mg total) onto the skin daily.  Dispense: 28 patch; Refill: 0  4. Chronic obstructive pulmonary disease with acute exacerbation (HCC) Still not completely improved. Discussed importance of smoking cessation as above and progression of COPD. Will change xopenex to duoneb nebulizer as below since this seemed to help her more. Will also add spiriva to daily routine. Theophylline as needed. She is to call if she has another exacerbation before the 4 week visit. If so, I will refer her to pulmonology for further eval and treatment. - ipratropium-albuterol (DUONEB) 0.5-2.5 (3) MG/3ML SOLN; Take 3 mLs by nebulization every 6 (six) hours as needed.  Dispense: 360 mL; Refill: 11 - tiotropium (SPIRIVA) 18 MCG inhalation capsule; Place 1 capsule (18 mcg total) into inhaler and inhale daily.  Dispense: 30 capsule; Refill: 11 - theophylline (THEODUR) 100 MG 12 hr tablet; Take 1 tablet (100 mg  total) by mouth 2 (two) times daily.  Dispense: 60 tablet; Refill: 0  5. Acute anxiety Stable. Refilled as below.  - clonazePAM (KLONOPIN) 0.5 MG tablet; Take 0.5 tablets (0.25 mg total) by mouth 2 (two) times daily as needed for anxiety. If 1/2 tablet is not effective can take one whole tablet twice daily.  Dispense: 60 tablet; Refill: 1  6. Atrophic vaginitis On exam she has atrophic vaginitis instead of the yeast infection like she thought. Will add estrace cream as below. Will f/u in 4 weeks to see if she has improvement in symptoms. - estradiol (ESTRACE) 0.1 MG/GM vaginal cream; Place 1 Applicatorful vaginally at bedtime.  Dispense: 42.5 g; Refill: [place], PA-C  [facility]

## 2015-11-04 NOTE — Patient Instructions (Signed)
Varenicline oral tablets What is this medicine? VARENICLINE (var EN i kleen) is used to help people quit smoking. It can reduce the symptoms caused by stopping smoking. It is used with a patient support program recommended by your physician. This medicine may be used for other purposes; ask your health care provider or pharmacist if you have questions. What should I tell my health care provider before I take this medicine? They need to know if you have any of these conditions: -bipolar disorder, depression, schizophrenia or other mental illness -heart disease -if you often drink alcohol -kidney disease -peripheral vascular disease -seizures -stroke -suicidal thoughts, plans, or attempt; a previous suicide attempt by you or a family member -an unusual or allergic reaction to varenicline, other medicines, foods, dyes, or preservatives -pregnant or trying to get pregnant -breast-feeding How should I use this medicine? Take this medicine by mouth after eating. Take with a full glass of water. Follow the directions on the prescription label. Take your doses at regular intervals. Do not take your medicine more often than directed. There are 3 ways you can use this medicine to help you quit smoking; talk to your health care professional to decide which plan is right for you: 1) you can choose a quit date and start this medicine 1 week before the quit date, or, 2) you can start taking this medicine before you choose a quit date, and then pick a quit date between day 8 and 35 days of treatment, or, 3) if you are not sure that you are able or willing to quit smoking right away, start taking this medicine and slowly decrease the amount you smoke as directed by your health care professional with the goal of being cigarette-free by week 12 of treatment. Stick to your plan; ask about support groups or other ways to help you remain cigarette-free. If you are motivated to quit smoking and did not succeed  during a previous attempt with this medicine for reasons other than side effects, or if you returned to smoking after this treatment, speak with your health care professional about whether another course of this medicine may be right for you. A special MedGuide will be given to you by the pharmacist with each prescription and refill. Be sure to read this information carefully each time. Talk to your pediatrician regarding the use of this medicine in children. This medicine is not approved for use in children. Overdosage: If you think you have taken too much of this medicine contact a poison control center or emergency room at once. NOTE: This medicine is only for you. Do not share this medicine with others. What if I miss a dose? If you miss a dose, take it as soon as you can. If it is almost time for your next dose, take only that dose. Do not take double or extra doses. What may interact with this medicine? -alcohol or any product that contains alcohol -insulin -other stop smoking aids -theophylline -warfarin This list may not describe all possible interactions. Give your health care provider a list of all the medicines, herbs, non-prescription drugs, or dietary supplements you use. Also tell them if you smoke, drink alcohol, or use illegal drugs. Some items may interact with your medicine. What should I watch for while using this medicine? Visit your doctor or health care professional for regular check ups. Ask for ongoing advice and encouragement from your doctor or healthcare professional, friends, and family to help you quit. If you smoke while on  this medication, quit again Your mouth may get dry. Chewing sugarless gum or sucking hard candy, and drinking plenty of water may help. Contact your doctor if the problem does not go away or is severe. You may get drowsy or dizzy. Do not drive, use machinery, or do anything that needs mental alertness until you know how this medicine affects you. Do  not stand or sit up quickly, especially if you are an older patient. This reduces the risk of dizzy or fainting spells. Sleepwalking can happen during treatment with this medicine, and can sometimes lead to behavior that is harmful to you, other people, or property. Stop taking this medicine and tell your doctor if you start sleepwalking or have other unusual sleep-related activity. Decrease the amount of alcoholic beverages that you drink during treatment with this medicine until you know if this medicine affects your ability to tolerate alcohol. Some people have experienced increased drunkenness (intoxication), unusual or sometimes aggressive behavior, or no memory of things that have happened (amnesia) during treatment with this medicine. The use of this medicine may increase the chance of suicidal thoughts or actions. Pay special attention to how you are responding while on this medicine. Any worsening of mood, or thoughts of suicide or dying should be reported to your health care professional right away. What side effects may I notice from receiving this medicine? Side effects that you should report to your doctor or health care professional as soon as possible: -allergic reactions like skin rash, itching or hives, swelling of the face, lips, tongue, or throat -acting aggressive, being angry or violent, or acting on dangerous impulses -breathing problems -changes in vision -chest pain or chest tightness -confusion, trouble speaking or understanding -new or worsening depression, anxiety, or panic attacks -extreme increase in activity and talking (mania) -fast, irregular heartbeat -feeling faint or lightheaded, falls -fever -pain in legs when walking -problems with balance, talking, walking -redness, blistering, peeling or loosening of the skin, including inside the mouth -ringing in ears -seeing or hearing things that aren't there (hallucinations) -seizures -sleepwalking -sudden numbness  or weakness of the face, arm or leg -thoughts about suicide or dying, or attempts to commit suicide -trouble passing urine or change in the amount of urine -unusual bleeding or bruising -unusually weak or tired Side effects that usually do not require medical attention (report to your doctor or health care professional if they continue or are bothersome): -constipation -headache -nausea, vomiting -strange dreams -stomach gas -trouble sleeping This list may not describe all possible side effects. Call your doctor for medical advice about side effects. You may report side effects to FDA at 1-800-FDA-1088. Where should I keep my medicine? Keep out of the reach of children. Store at room temperature between 15 and 30 degrees C (59 and 86 degrees F). Throw away any unused medicine after the expiration date. NOTE: This sheet is a summary. It may not cover all possible information. If you have questions about this medicine, talk to your doctor, pharmacist, or health care provider.    2016, Elsevier/[employer]. (2015-04-02 16:14:23) Smoking Cessation, Tips for Success If you are ready to quit smoking, congratulations! You have chosen to help yourself be healthier. Cigarettes bring nicotine, tar, carbon monoxide, and other irritants into your body. Your lungs, heart, and blood vessels will be able to work better without these poisons. There are many different ways to quit smoking. Nicotine gum, nicotine patches, a nicotine inhaler, or nicotine nasal spray can help with physical craving. Hypnosis, support  groups, and medicines help break the habit of smoking. WHAT THINGS CAN I DO TO MAKE QUITTING EASIER?  Here are some tips to help you quit for good:  Pick a date when you will quit smoking completely. Tell all of your friends and family about your plan to quit on that date.  Do not try to slowly cut down on the number of cigarettes you are smoking. Pick a quit date and quit smoking completely  starting on that day.  Throw away all cigarettes.   Clean and remove all ashtrays from your home, work, and car.  On a card, write down your reasons for quitting. Carry the card with you and read it when you get the urge to smoke.  Cleanse your body of nicotine. Drink enough water and fluids to keep your urine clear or pale yellow. Do this after quitting to flush the nicotine from your body.  Learn to predict your moods. Do not let a bad situation be your excuse to have a cigarette. Some situations in your life might tempt you into wanting a cigarette.  Never have "just one" cigarette. It leads to wanting another and another. Remind yourself of your decision to quit.  Change habits associated with smoking. If you smoked while driving or when feeling stressed, try other activities to replace smoking. Stand up when drinking your coffee. Brush your teeth after eating. Sit in a different chair when you read the paper. Avoid alcohol while trying to quit, and try to drink fewer caffeinated beverages. Alcohol and caffeine may urge you to smoke.  Avoid foods and drinks that can trigger a desire to smoke, such as sugary or spicy foods and alcohol.  Ask people who smoke not to smoke around you.  Have something planned to do right after eating or having a cup of coffee. For example, plan to take a walk or exercise.  Try a relaxation exercise to calm you down and decrease your stress. Remember, you may be tense and nervous for the first 2 weeks after you quit, but this will pass.  Find new activities to keep your hands busy. Play with a pen, coin, or rubber band. Doodle or draw things on paper.  Brush your teeth right after eating. This will help cut down on the craving for the taste of tobacco after meals. You can also try mouthwash.   Use oral substitutes in place of cigarettes. Try using lemon drops, carrots, cinnamon sticks, or chewing gum. Keep them handy so they are available when you have  the urge to smoke.  When you have the urge to smoke, try deep breathing.  Designate your home as a nonsmoking area.  If you are a heavy smoker, ask your health care provider about a prescription for nicotine chewing gum. It can ease your withdrawal from nicotine.  Reward yourself. Set aside the cigarette money you save and buy yourself something nice.  Look for support from others. Join a support group or smoking cessation program. Ask someone at home or at work to help you with your plan to quit smoking.  Always ask yourself, "Do I need this cigarette or is this just a reflex?" Tell yourself, "Today, I choose not to smoke," or "I do not want to smoke." You are reminding yourself of your decision to quit.  Do not replace cigarette smoking with electronic cigarettes (commonly called e-cigarettes). The safety of e-cigarettes is unknown, and some may contain harmful chemicals.  If you relapse, do not give  up! Plan ahead and think about what you will do the next time you get the urge to smoke. HOW WILL I FEEL WHEN I QUIT SMOKING? You may have symptoms of withdrawal because your body is used to nicotine (the addictive substance in cigarettes). You may crave cigarettes, be irritable, feel very hungry, cough often, get headaches, or have difficulty concentrating. The withdrawal symptoms are only temporary. They are strongest when you first quit but will go away within 10-14 days. When withdrawal symptoms occur, stay in control. Think about your reasons for quitting. Remind yourself that these are signs that your body is healing and getting used to being without cigarettes. Remember that withdrawal symptoms are easier to treat than the major diseases that smoking can cause.  Even after the withdrawal is over, expect periodic urges to smoke. However, these cravings are generally short lived and will go away whether you smoke or not. Do not smoke! WHAT RESOURCES ARE AVAILABLE TO HELP ME QUIT SMOKING? Your  health care provider can direct you to community resources or hospitals for support, which may include:  Group support.  Education.  Hypnosis.  Therapy.   This information is not intended to replace advice given to you by your health care provider. Make sure you discuss any questions you have with your health care provider.   Document Released: 04/15/2004 Document Revised: 08/08/2014 Document Reviewed: 01/03/2013 Elsevier Interactive Patient Education [employer].

## 2015-11-06 ENCOUNTER — Ambulatory Visit: Payer: Self-pay | Admitting: Physician Assistant

## 2015-11-06 DIAGNOSIS — L821 Other seborrheic keratosis: Secondary | ICD-10-CM | POA: Diagnosis not present

## 2015-11-10 ENCOUNTER — Telehealth: Payer: Self-pay | Admitting: Physician Assistant

## 2015-11-10 DIAGNOSIS — N952 Postmenopausal atrophic vaginitis: Secondary | ICD-10-CM

## 2015-11-10 NOTE — Telephone Encounter (Signed)
Spoke with pt, she is saw [facility] on 11/04/15 for COPD exacerbation, that has gotten better and resolving. It the atrophic vaginitis that she is still having problems with. She is still having burning, chaffing, and cracking of the vaginal area. She has been using the Premarin cream every night like prescribed. She said that [facility] told her that if the cream did not work then she could try a pill. Pt would like something called in.  [facility].

## 2015-11-10 NOTE — Telephone Encounter (Signed)
Pt states she was in last week and rec'd a Rx.  Pt states she is not any better.  Pt has used all but 1 dose of the cream.  Pt is requesting a different Rx if possible.  [facility].  [id number]

## 2015-11-10 NOTE — Telephone Encounter (Signed)
This will have to wait for [facility]. Thanks.

## 2015-11-11 MED ORDER — MEDROXYPROGESTERONE ACETATE 2.5 MG PO TABS: 2.5000 mg | ORAL_TABLET | Freq: Every day | ORAL | Status: DC

## 2015-11-11 MED ORDER — ESTRADIOL 0.5 MG PO TABS: 0.5000 mg | ORAL_TABLET | Freq: Every day | ORAL | Status: DC

## 2015-11-11 NOTE — Telephone Encounter (Signed)
Patient advised as directed below.  Thanks,  -Samaiya Awadallah 

## 2015-11-11 NOTE — Telephone Encounter (Signed)
Sent in estrace and provera to [facility]. Starting with low dose and can titrate up until symptom relief. Have to do both to decrease adverse events that can be possible with estrogen alone as we had discussed in the office visit for women that still have their female reproductive organs.

## 2015-11-27 ENCOUNTER — Other Ambulatory Visit: Payer: Self-pay | Admitting: Physician Assistant

## 2015-11-27 DIAGNOSIS — E039 Hypothyroidism, unspecified: Secondary | ICD-10-CM

## 2015-11-27 DIAGNOSIS — M545 Low back pain, unspecified: Secondary | ICD-10-CM

## 2015-11-27 DIAGNOSIS — F329 Major depressive disorder, single episode, unspecified: Secondary | ICD-10-CM

## 2015-11-27 DIAGNOSIS — K219 Gastro-esophageal reflux disease without esophagitis: Secondary | ICD-10-CM

## 2015-11-27 DIAGNOSIS — F32A Depression, unspecified: Secondary | ICD-10-CM

## 2015-11-27 MED ORDER — MELOXICAM 15 MG PO TABS: 15.0000 mg | ORAL_TABLET | Freq: Every day | ORAL | Status: DC

## 2015-11-27 MED ORDER — LEVOTHYROXINE SODIUM 50 MCG PO TABS: 50.0000 ug | ORAL_TABLET | Freq: Every day | ORAL | Status: DC

## 2015-11-27 MED ORDER — DEXILANT 60 MG PO CPDR: 60.0000 mg | DELAYED_RELEASE_CAPSULE | Freq: Every day | ORAL | Status: DC

## 2015-12-02 ENCOUNTER — Ambulatory Visit (INDEPENDENT_AMBULATORY_CARE_PROVIDER_SITE_OTHER): Payer: BLUE CROSS/BLUE SHIELD | Admitting: Physician Assistant

## 2015-12-02 ENCOUNTER — Encounter: Payer: Self-pay | Admitting: Physician Assistant

## 2015-12-02 VITALS — BP 108/70 | HR 91 | Temp 97.8°F | Resp 16 | Wt 241.6 lb

## 2015-12-02 DIAGNOSIS — F419 Anxiety disorder, unspecified: Secondary | ICD-10-CM | POA: Diagnosis not present

## 2015-12-02 DIAGNOSIS — Z720 Tobacco use: Secondary | ICD-10-CM | POA: Diagnosis not present

## 2015-12-02 DIAGNOSIS — R05 Cough: Secondary | ICD-10-CM | POA: Diagnosis not present

## 2015-12-02 DIAGNOSIS — G2581 Restless legs syndrome: Secondary | ICD-10-CM

## 2015-12-02 DIAGNOSIS — Z716 Tobacco abuse counseling: Secondary | ICD-10-CM

## 2015-12-02 DIAGNOSIS — F17209 Nicotine dependence, unspecified, with unspecified nicotine-induced disorders: Secondary | ICD-10-CM

## 2015-12-02 DIAGNOSIS — R059 Cough, unspecified: Secondary | ICD-10-CM

## 2015-12-02 DIAGNOSIS — N952 Postmenopausal atrophic vaginitis: Secondary | ICD-10-CM

## 2015-12-02 MED ORDER — ESTRADIOL 0.5 MG PO TABS: 0.5000 mg | ORAL_TABLET | Freq: Every day | ORAL | Status: DC

## 2015-12-02 MED ORDER — BENZONATATE 200 MG PO CAPS: 200.0000 mg | ORAL_CAPSULE | Freq: Three times a day (TID) | ORAL | Status: DC | PRN

## 2015-12-02 MED ORDER — VARENICLINE TARTRATE 1 MG PO TABS: 1.0000 mg | ORAL_TABLET | Freq: Two times a day (BID) | ORAL | Status: DC

## 2015-12-02 MED ORDER — GABAPENTIN 300 MG PO CAPS: 1200.0000 mg | ORAL_CAPSULE | Freq: Every day | ORAL | Status: DC

## 2015-12-02 MED ORDER — MEDROXYPROGESTERONE ACETATE 2.5 MG PO TABS: 2.5000 mg | ORAL_TABLET | Freq: Every day | ORAL | Status: DC

## 2015-12-02 NOTE — Patient Instructions (Signed)
Smoking Cessation, Tips for Success If you are ready to quit smoking, congratulations! You have chosen to help yourself be healthier. Cigarettes bring nicotine, tar, carbon monoxide, and other irritants into your body. Your lungs, heart, and blood vessels will be able to work better without these poisons. There are many different ways to quit smoking. Nicotine gum, nicotine patches, a nicotine inhaler, or nicotine nasal spray can help with physical craving. Hypnosis, support groups, and medicines help break the habit of smoking. WHAT THINGS CAN I DO TO MAKE QUITTING EASIER?  Here are some tips to help you quit for good:  Pick a date when you will quit smoking completely. Tell all of your friends and family about your plan to quit on that date.  Do not try to slowly cut down on the number of cigarettes you are smoking. Pick a quit date and quit smoking completely starting on that day.  Throw away all cigarettes.   Clean and remove all ashtrays from your home, work, and car.  On a card, write down your reasons for quitting. Carry the card with you and read it when you get the urge to smoke.  Cleanse your body of nicotine. Drink enough water and fluids to keep your urine clear or pale yellow. Do this after quitting to flush the nicotine from your body.  Learn to predict your moods. Do not let a bad situation be your excuse to have a cigarette. Some situations in your life might tempt you into wanting a cigarette.  Never have "just one" cigarette. It leads to wanting another and another. Remind yourself of your decision to quit.  Change habits associated with smoking. If you smoked while driving or when feeling stressed, try other activities to replace smoking. Stand up when drinking your coffee. Brush your teeth after eating. Sit in a different chair when you read the paper. Avoid alcohol while trying to quit, and try to drink fewer caffeinated beverages. Alcohol and caffeine may urge you to  smoke.  Avoid foods and drinks that can trigger a desire to smoke, such as sugary or spicy foods and alcohol.  Ask people who smoke not to smoke around you.  Have something planned to do right after eating or having a cup of coffee. For example, plan to take a walk or exercise.  Try a relaxation exercise to calm you down and decrease your stress. Remember, you may be tense and nervous for the first 2 weeks after you quit, but this will pass.  Find new activities to keep your hands busy. Play with a pen, coin, or rubber band. Doodle or draw things on paper.  Brush your teeth right after eating. This will help cut down on the craving for the taste of tobacco after meals. You can also try mouthwash.   Use oral substitutes in place of cigarettes. Try using lemon drops, carrots, cinnamon sticks, or chewing gum. Keep them handy so they are available when you have the urge to smoke.  When you have the urge to smoke, try deep breathing.  Designate your home as a nonsmoking area.  If you are a heavy smoker, ask your health care provider about a prescription for nicotine chewing gum. It can ease your withdrawal from nicotine.  Reward yourself. Set aside the cigarette money you save and buy yourself something nice.  Look for support from others. Join a support group or smoking cessation program. Ask someone at home or at work to help you with your plan   to quit smoking.  Always ask yourself, "Do I need this cigarette or is this just a reflex?" Tell yourself, "Today, I choose not to smoke," or "I do not want to smoke." You are reminding yourself of your decision to quit.  Do not replace cigarette smoking with electronic cigarettes (commonly called e-cigarettes). The safety of e-cigarettes is unknown, and some may contain harmful chemicals.  If you relapse, do not give up! Plan ahead and think about what you will do the next time you get the urge to smoke. HOW WILL I FEEL WHEN I QUIT SMOKING? You  may have symptoms of withdrawal because your body is used to nicotine (the addictive substance in cigarettes). You may crave cigarettes, be irritable, feel very hungry, cough often, get headaches, or have difficulty concentrating. The withdrawal symptoms are only temporary. They are strongest when you first quit but will go away within 10-14 days. When withdrawal symptoms occur, stay in control. Think about your reasons for quitting. Remind yourself that these are signs that your body is healing and getting used to being without cigarettes. Remember that withdrawal symptoms are easier to treat than the major diseases that smoking can cause.  Even after the withdrawal is over, expect periodic urges to smoke. However, these cravings are generally short lived and will go away whether you smoke or not. Do not smoke! WHAT RESOURCES ARE AVAILABLE TO HELP ME QUIT SMOKING? Your health care provider can direct you to community resources or hospitals for support, which may include:  Group support.  Education.  Hypnosis.  Therapy.   This information is not intended to replace advice given to you by your health care provider. Make sure you discuss any questions you have with your health care provider.   Document Released: 04/15/2004 Document Revised: 08/08/2014 Document Reviewed: 01/03/2013 Elsevier Interactive Patient Education 2016 [employer].  

## 2015-12-02 NOTE — Progress Notes (Signed)
Patient: Linda Bradford Female    DOB: 03-27-1956   60 y.o.   MRN: [id number] Visit Date: 12/02/2015  Today's Provider: Mar Daring, PA-C   Chief Complaint  Patient presents with  . Follow-up    Smoking Cessation, COPD, Agrophic Vaginitis.   Subjective:    HPI  Smoking Cessation counseling: She is following up.Chantix prescribed on 04/05 and magic mouth with Lidocaine SOLN to help with mouth sores if they develop since chantix was prescribed.She reports that she is smoking every other day 1 PPD. She reports that before she used to smoked 2 PPD. She also reports no one smokes inside the house anymore. She reports that she is feeling better and that a lot of the sinus drainage has stopped.  COPD with acute exacerbation: Patient is here to follow-up. In the last office visit her Xoponex got changed to Duoneb nebulizer and Spiriva was added. Theophylline was also added as needed. She was also referred to Pulmonology. She takes the Spiriva in the morning and one Nebulizer treatment a day. The Advair inhaler BID and the proair as needed. She reports that with smoking less that the cough is less productive. She was coughing a lot last night and she took the last Tessalon pearls that she had. She needs refill on the Tessalon. She reports that she is breathing better, a little wheezing not like before. She still has trouble with her breathing when walking up the stairs.  Agrophic Vaginitis: Estrace Cream was added at the last office visit .She reports that the Estrace cream didn't help. She took the provera that was sent. She reports that she can feel that her symptoms are coming back. She was supposed to have switched to estrace tablet and provera but had misunderstood.  Anxiety: Stable. She wants refills. She reports that she shakes a lot with all the inhaler. If she takes one of the Klonopin it calms it down.     No Known Allergies Previous Medications   ALBUTEROL (PROVENTIL  HFA;VENTOLIN HFA) 108 (90 BASE) MCG/ACT INHALER    Inhale 1 puff into the lungs every 4 (four) hours as needed.   ASPIRIN 81 MG TABLET    Take 81 mg by mouth.   CLONAZEPAM (KLONOPIN) 0.5 MG TABLET    Take 0.5 tablets (0.25 mg total) by mouth 2 (two) times daily as needed for anxiety. If 1/2 tablet is not effective can take one whole tablet twice daily.   CYANOCOBALAMIN (RA VITAMIN B-12 TR) 1000 MCG TBCR    Take by mouth.   DEXILANT 60 MG CAPSULE    Take 1 capsule (60 mg total) by mouth daily.   DULOXETINE (CYMBALTA) 60 MG CAPSULE    Take 1 capsule (60 mg total) by mouth 2 (two) times daily.   ESTRADIOL (ESTRACE) 0.5 MG TABLET    Take 1 tablet (0.5 mg total) by mouth daily.   FEXOFENADINE-PSEUDOEPHEDRINE (ALLEGRA-D ALLERGY & CONGESTION) 180-240 MG 24 HR TABLET    Take 1 tablet by mouth daily.   FLUTICASONE (FLONASE) 50 MCG/ACT NASAL SPRAY    Place 2 sprays into both nostrils daily.   FLUTICASONE-SALMETEROL (ADVAIR DISKUS) 250-50 MCG/DOSE AEPB    Inhale 1 puff into the lungs daily.   GABAPENTIN (NEURONTIN) 300 MG CAPSULE    Take 4 capsules (1,200 mg total) by mouth at bedtime.   IPRATROPIUM-ALBUTEROL (DUONEB) 0.5-2.5 (3) MG/3ML SOLN    Take 3 mLs by nebulization every 6 (six) hours as needed.  LEVOTHYROXINE (SYNTHROID, LEVOTHROID) 50 MCG TABLET    Take 1 tablet (50 mcg total) by mouth daily before breakfast.   MAGIC MOUTHWASH W/LIDOCAINE SOLN    Take 5 mLs by mouth 3 (three) times daily as needed for mouth pain.   MEDROXYPROGESTERONE (PROVERA) 2.5 MG TABLET    Take 1 tablet (2.5 mg total) by mouth daily.   MELOXICAM (MOBIC) 15 MG TABLET    Take 1 tablet (15 mg total) by mouth daily.   NICOTINE (NICOTINE STEP 1) 21 MG/24HR PATCH    Place 1 patch (21 mg total) onto the skin daily.   NICOTINE (NICOTINE STEP 2) 14 MG/24HR PATCH    Place 1 patch (14 mg total) onto the skin daily.   NICOTINE (NICOTINE STEP 3) 7 MG/24HR PATCH    Place 1 patch (7 mg total) onto the skin daily.   NYSTATIN (MYCOSTATIN)  100000 UNIT/ML SUSPENSION    Take 5 mLs (500,000 Units total) by mouth 4 (four) times daily.   PREDNISONE (DELTASONE) 20 MG TABLET    Take 3 tablets (60 mg total) by mouth daily.   QUETIAPINE (SEROQUEL XR) 50 MG TB24 24 HR TABLET    Take 1 tablet (50 mg total) by mouth at bedtime.   THEOPHYLLINE (THEODUR) 100 MG 12 HR TABLET    Take 1 tablet (100 mg total) by mouth 2 (two) times daily.   TIOTROPIUM (SPIRIVA) 18 MCG INHALATION CAPSULE    Place 1 capsule (18 mcg total) into inhaler and inhale daily.   TIZANIDINE (ZANAFLEX) 4 MG CAPSULE    Take 1 capsule (4 mg total) by mouth 3 (three) times daily.   VARENICLINE (CHANTIX STARTING MONTH PAK) 0.5 MG X 11 & 1 MG X 42 TABLET    Take one 0.5 mg tablet by mouth once daily for 3 days, then increase to one 0.5 mg tablet twice daily for 4 days, then increase to one 1 mg tablet twice daily.    Review of Systems  Constitutional: Negative for fatigue.  HENT: Negative.   Eyes: Negative.   Respiratory: Positive for cough, shortness of breath (much improved) and wheezing (much improved). Negative for chest tightness.   Cardiovascular: Positive for leg swelling (left foot a week ago ). Negative for chest pain and palpitations.  Gastrointestinal: Negative.   Genitourinary: Negative.  Vaginal pain: occasional dryness.  Musculoskeletal: Negative.   Neurological: Negative.     Social History  Substance Use Topics  . Smoking status: Current Every Day Smoker -- 1.00 packs/day    Types: Cigarettes    Start date: 03/02/1980  . Smokeless tobacco: Never Used     Comment: every other day  . Alcohol Use: No   Objective:   BP 108/70 mmHg  Pulse 91  Temp(Src) 97.8 F (36.6 C) (Oral)  Resp 16  Wt 241 lb 9.6 oz (109.589 kg)  SpO2 96%  Physical Exam  Constitutional: She appears well-developed and well-nourished. No distress.  Neck: Normal range of motion. Neck supple. No JVD present. No tracheal deviation present. No thyromegaly present.  Cardiovascular:  Normal rate, regular rhythm and normal heart sounds.  Exam reveals no gallop and no friction rub.   No murmur heard. Pulmonary/Chest: Effort normal. No respiratory distress. She has wheezes. She has no rales.  Lymphadenopathy:    She has no cervical adenopathy.  Skin: She is not diaphoretic.  Vitals reviewed.       Assessment & Plan:     1. Restless leg syndrome Stable. Diagnosis pulled for  medication refill. Continue current medical treatment plan. - gabapentin (NEURONTIN) 300 MG capsule; Take 4 capsules (1,200 mg total) by mouth at bedtime.  Dispense: 120 capsule; Refill: 6  2. Acute anxiety Stable.   3. Encounter for smoking cessation counseling Down to 1/2 ppd from 2 ppd. Will continue chantix and f/u in 2 months on smoking cessation. - varenicline (CHANTIX CONTINUING MONTH PAK) 1 MG tablet; Take 1 tablet (1 mg total) by mouth 2 (two) times daily.  Dispense: 60 tablet; Refill: 1  4. Cough Stable. Diagnosis pulled for medication refill. Continue current medical treatment plan. - benzonatate (TESSALON) 200 MG capsule; Take 1 capsule (200 mg total) by mouth 3 (three) times daily as needed for cough.  Dispense: 30 capsule; Refill: 0  5. Tobacco use disorder, continuous See medical treatment plan for #3.  6. Tobacco abuse counseling See medical treatment plan for #3.  7. Postmenopausal atrophic vaginitis Explained that I had switched her from the estrace cream to tablets to see if that offered better symptom relief. Will resend estrace tablets and provera as below. Instructed on how to take and she voiced understanding. She is to call if no improvement. - estradiol (ESTRACE) 0.5 MG tablet; Take 1 tablet (0.5 mg total) by mouth daily.  Dispense: 30 tablet; Refill: 6 - medroxyPROGESTERone (PROVERA) 2.5 MG tablet; Take 1 tablet (2.5 mg total) by mouth daily.  Dispense: 30 tablet; Refill: [place], PA-C  [facility]

## 2015-12-04 ENCOUNTER — Other Ambulatory Visit: Payer: Self-pay | Admitting: Physician Assistant

## 2015-12-18 ENCOUNTER — Other Ambulatory Visit: Payer: Self-pay | Admitting: Physician Assistant

## 2015-12-18 DIAGNOSIS — F419 Anxiety disorder, unspecified: Secondary | ICD-10-CM

## 2015-12-18 MED ORDER — CLONAZEPAM 0.5 MG PO TABS
0.2500 mg | ORAL_TABLET | Freq: Two times a day (BID) | ORAL | Status: DC | PRN
Start: 2015-12-18 — End: 2016-09-15

## 2015-12-18 NOTE — Progress Notes (Signed)
RX called in-aa 

## 2015-12-18 NOTE — Progress Notes (Unsigned)
Can we please call in Clonazepam 0.5mg  Take 1/2-1 tab PO BID prn anxiety. #60 5RF to [facility]? Thanks!

## 2016-02-01 ENCOUNTER — Ambulatory Visit: Payer: Self-pay | Admitting: Physician Assistant

## 2016-02-05 ENCOUNTER — Other Ambulatory Visit: Payer: Self-pay

## 2016-02-05 DIAGNOSIS — Z716 Tobacco abuse counseling: Secondary | ICD-10-CM

## 2016-02-05 MED ORDER — VARENICLINE TARTRATE 1 MG PO TABS: 1.0000 mg | ORAL_TABLET | Freq: Two times a day (BID) | ORAL | Status: DC

## 2016-02-05 NOTE — Telephone Encounter (Signed)
Pharmacy requesting refill.

## 2016-04-25 ENCOUNTER — Telehealth: Payer: Self-pay | Admitting: Physician Assistant

## 2016-04-25 ENCOUNTER — Encounter: Payer: Self-pay | Admitting: Physician Assistant

## 2016-04-25 ENCOUNTER — Ambulatory Visit (INDEPENDENT_AMBULATORY_CARE_PROVIDER_SITE_OTHER): Payer: BLUE CROSS/BLUE SHIELD | Admitting: Physician Assistant

## 2016-04-25 VITALS — BP 128/66 | HR 100 | Temp 98.0°F | Resp 16 | Ht 70.0 in | Wt 239.0 lb

## 2016-04-25 DIAGNOSIS — Z1239 Encounter for other screening for malignant neoplasm of breast: Secondary | ICD-10-CM | POA: Diagnosis not present

## 2016-04-25 DIAGNOSIS — R5382 Chronic fatigue, unspecified: Secondary | ICD-10-CM | POA: Diagnosis not present

## 2016-04-25 DIAGNOSIS — J449 Chronic obstructive pulmonary disease, unspecified: Secondary | ICD-10-CM

## 2016-04-25 DIAGNOSIS — E039 Hypothyroidism, unspecified: Secondary | ICD-10-CM | POA: Diagnosis not present

## 2016-04-25 DIAGNOSIS — J441 Chronic obstructive pulmonary disease with (acute) exacerbation: Secondary | ICD-10-CM | POA: Diagnosis not present

## 2016-04-25 DIAGNOSIS — E559 Vitamin D deficiency, unspecified: Secondary | ICD-10-CM | POA: Diagnosis not present

## 2016-04-25 DIAGNOSIS — R002 Palpitations: Secondary | ICD-10-CM

## 2016-04-25 DIAGNOSIS — F17200 Nicotine dependence, unspecified, uncomplicated: Secondary | ICD-10-CM

## 2016-04-25 DIAGNOSIS — Z720 Tobacco use: Secondary | ICD-10-CM

## 2016-04-25 DIAGNOSIS — J411 Mucopurulent chronic bronchitis: Secondary | ICD-10-CM

## 2016-04-25 MED ORDER — THEOPHYLLINE ER 300 MG PO CP24: 300.0000 mg | ORAL_CAPSULE | Freq: Every day | ORAL | 5 refills | Status: DC

## 2016-04-25 MED ORDER — THEOPHYLLINE ER 100 MG PO TB12: 100.0000 mg | ORAL_TABLET | Freq: Two times a day (BID) | ORAL | 5 refills | Status: DC

## 2016-04-25 MED ORDER — ALBUTEROL SULFATE HFA 108 (90 BASE) MCG/ACT IN AERS: 1.0000 | INHALATION_SPRAY | RESPIRATORY_TRACT | 11 refills | Status: DC | PRN

## 2016-04-25 NOTE — Telephone Encounter (Signed)
Amanda at [facility] called saying that the rx Theophylline is no longer available.  Please advise [phone]  Thanks Con Memos

## 2016-04-25 NOTE — Telephone Encounter (Signed)
Advised patient that new dose has been sent into the pharmacy.

## 2016-04-25 NOTE — Progress Notes (Signed)
Patient: Linda Bradford Female    DOB: 1955-12-19   60 y.o.   MRN: [id number] Visit Date: 04/25/2016  Today's Provider: Mar Daring, PA-C   Chief Complaint  Patient presents with  . Fatigue   Subjective:    HPI  Fatigue: Patient complains of fatigue. Symptoms began several weeks ago. Sentinal symptom the patient feels fatigue began with: none. Symptoms of her fatigue have been lack of interest in usual activities. Patient describes the following psychologic symptoms: depression.  Patient denies cold intolerance, constipation and change in hair texture. and GI blood loss. Symptoms have gradually worsened. Severity has been struggles to carry out day to day responsibilities.. Previous visits for this problem: none.   She denies any other symptoms such as URI, SOB, DOE, lower extremity edema, chest pain, or dizziness. She does have occasional palpitations that come on at any given time. No chest pain associated. Also denies this feeling like worsening depression and anxiety stating, " this is different than those symptoms."    No Known Allergies   Current Outpatient Prescriptions:  .  albuterol (PROVENTIL HFA;VENTOLIN HFA) 108 (90 BASE) MCG/ACT inhaler, Inhale 1 puff into the lungs every 4 (four) hours as needed., Disp: 1 Inhaler, Rfl: 11 .  aspirin 81 MG tablet, Take 81 mg by mouth., Disp: , Rfl:  .  clonazePAM (KLONOPIN) 0.5 MG tablet, Take 0.5 tablets (0.25 mg total) by mouth 2 (two) times daily as needed for anxiety. If 1/2 tablet is not effective can take one whole tablet twice daily., Disp: 60 tablet, Rfl: 5 .  Cyanocobalamin (RA VITAMIN B-12 TR) 1000 MCG TBCR, Take by mouth., Disp: , Rfl:  .  DEXILANT 60 MG capsule, Take 1 capsule (60 mg total) by mouth daily., Disp: 30 capsule, Rfl: 6 .  DULoxetine (CYMBALTA) 60 MG capsule, Take 1 capsule (60 mg total) by mouth 2 (two) times daily., Disp: 60 capsule, Rfl: 6 .  estradiol (ESTRACE) 0.5 MG tablet, Take 1 tablet (0.5 mg  total) by mouth daily., Disp: 30 tablet, Rfl: 6 .  fluticasone (FLONASE) 50 MCG/ACT nasal spray, Place 2 sprays into both nostrils daily., Disp: 16 g, Rfl: 11 .  Fluticasone-Salmeterol (ADVAIR DISKUS) 250-50 MCG/DOSE AEPB, Inhale 1 puff into the lungs daily., Disp: 60 each, Rfl: 6 .  gabapentin (NEURONTIN) 300 MG capsule, Take 4 capsules (1,200 mg total) by mouth at bedtime., Disp: 120 capsule, Rfl: 6 .  ipratropium-albuterol (DUONEB) 0.5-2.5 (3) MG/3ML SOLN, Take 3 mLs by nebulization every 6 (six) hours as needed., Disp: 360 mL, Rfl: 11 .  levothyroxine (SYNTHROID, LEVOTHROID) 50 MCG tablet, Take 1 tablet (50 mcg total) by mouth daily before breakfast., Disp: 30 tablet, Rfl: 6 .  medroxyPROGESTERone (PROVERA) 2.5 MG tablet, Take 1 tablet (2.5 mg total) by mouth daily., Disp: 30 tablet, Rfl: 6 .  meloxicam (MOBIC) 15 MG tablet, Take 1 tablet (15 mg total) by mouth daily., Disp: 30 tablet, Rfl: 6 .  QUEtiapine (SEROQUEL XR) 50 MG TB24 24 hr tablet, Take 1 tablet (50 mg total) by mouth at bedtime., Disp: 30 tablet, Rfl: 6 .  theophylline (THEODUR) 100 MG 12 hr tablet, Take 1 tablet (100 mg total) by mouth 2 (two) times daily., Disp: 60 tablet, Rfl: 0 .  tiotropium (SPIRIVA) 18 MCG inhalation capsule, Place 1 capsule (18 mcg total) into inhaler and inhale daily., Disp: 30 capsule, Rfl: 11 .  tiZANidine (ZANAFLEX) 4 MG capsule, Take 1 capsule (4 mg total) by mouth 3 (  three) times daily., Disp: 90 capsule, Rfl: 6 .  benzonatate (TESSALON) 200 MG capsule, Take 1 capsule (200 mg total) by mouth 3 (three) times daily as needed for cough. (Patient not taking: Reported on 04/25/2016), Disp: 30 capsule, Rfl: 0 .  fexofenadine-pseudoephedrine (ALLEGRA-D ALLERGY & CONGESTION) 180-240 MG 24 hr tablet, Take 1 tablet by mouth daily. (Patient not taking: Reported on 04/25/2016), Disp: 30 tablet, Rfl: 3 .  magic mouthwash w/lidocaine SOLN, Take 5 mLs by mouth 3 (three) times daily as needed for mouth pain. (Patient not  taking: Reported on 04/25/2016), Disp: 120 mL, Rfl: 0  Review of Systems  Constitutional: Positive for activity change and fatigue.  HENT: Negative.   Respiratory: Positive for cough. Negative for chest tightness, shortness of breath and wheezing.   Cardiovascular: Positive for palpitations. Negative for chest pain and leg swelling.  Gastrointestinal: Negative.   Musculoskeletal: Positive for myalgias.  Neurological: Negative.     Social History  Substance Use Topics  . Smoking status: Current Every Day Smoker    Packs/day: 1.00    Types: Cigarettes    Start date: 03/02/1980  . Smokeless tobacco: Never Used     Comment: every other day  . Alcohol use No   Objective:   BP 128/66 (BP Location: Left Arm, Patient Position: Sitting, Cuff Size: Large)   Pulse 100   Temp 98 F (36.7 C) (Oral)   Resp 16   Ht 5\' 10"  (1.778 m)   Wt 239 lb (108.4 kg)   SpO2 95%   BMI 34.29 kg/m   Physical Exam  Constitutional: She appears well-developed and well-nourished. No distress.  Neck: Normal range of motion. Neck supple. No JVD present. No tracheal deviation present. No thyromegaly present.  Cardiovascular: Normal rate, regular rhythm and normal heart sounds.  Exam reveals no gallop and no friction rub.   No murmur heard. Pulmonary/Chest: Effort normal and breath sounds normal. No respiratory distress. She has no wheezes. She has no rales.  Musculoskeletal: She exhibits no edema.  Lymphadenopathy:    She has no cervical adenopathy.  Skin: She is not diaphoretic.  Psychiatric: She has a normal mood and affect. Her behavior is normal. Judgment and thought content normal.  Vitals reviewed.     Assessment & Plan:     1. Chronic fatigue Unknown cause of recent fatigue. We'll check labs as below and follow-up pending these results. If labs are stable and within normal limits we may consider referral to cardiology for consideration of Holter monitor for palpitations. She agrees with this plan. I  will follow-up with her pending the results of the labs. - CBC with Differential - B12 - Iron Binding Cap (TIBC) - Vitamin D (25 hydroxy) - TSH  2. Heart palpitations See above medical treatment plan. - CBC with Differential - B12 - Iron Binding Cap (TIBC) - Vitamin D (25 hydroxy) - TSH  3. Mucopurulent chronic bronchitis (HCC) Stable. Diagnosis pulled for medication refill. Continue current medical treatment plan. - albuterol (PROVENTIL HFA;VENTOLIN HFA) 108 (90 Base) MCG/ACT inhaler; Inhale 1 puff into the lungs every 4 (four) hours as needed.  Dispense: 1 Inhaler; Refill: 11  4. Chronic obstructive pulmonary disease with acute exacerbation (HCC) Stable. Diagnosis pulled for medication refill. Continue current medical treatment plan. - theophylline (THEODUR) 100 MG 12 hr tablet; Take 1 tablet (100 mg total) by mouth 2 (two) times daily.  Dispense: 60 tablet; Refill: 5  5. Breast cancer screening She is due for her mammogram. She does  have positive family history with breast cancer in her brother. She has had previous abnormal screening mammograms many years ago but the last 6 have been normal per patient. She has never had a personal history of breast cancer and has never had to have a biopsy. - MM Digital Screening; Future  6. Tobacco abuse Current every day smoker reporting that she smokes about half a pack now. She smokes at least a pack a day for the last 45% or years. Patient states she started smoking when she was 14. Only recently cut back to a half a pack a day last December. I will order a screening chest CT as below to make sure that this may not be cause of fatigue. She denies weight loss. - CT CHEST LUNG CA SCREEN LOW DOSE W/O CM; Future  7. Current smoker See above medical treatment plan. - CT CHEST LUNG CA SCREEN LOW DOSE W/O CM; Future       Mar Daring, PA-C  [facility]

## 2016-04-25 NOTE — Telephone Encounter (Signed)
Can we call to see if the dose is no longer available, and if so which doses they have or see if the medication is no longer available in the [place]?

## 2016-04-25 NOTE — Telephone Encounter (Signed)
Estill Bamberg (pharmacist) reports that they no longer carry 100mg  XR capsules. She reports that the smallest dose that they have are 300mg  XR capsules.

## 2016-04-25 NOTE — Telephone Encounter (Signed)
Theophylline Rx changed from 100 BID to 300 once daily due to availability

## 2016-04-25 NOTE — Patient Instructions (Signed)
Chronic Fatigue Syndrome Chronic fatigue syndrome (CFS) is a condition in which there is lasting, extreme tiredness (fatigue) that does not improve with rest. CFS affects women up to four times more often than men. If you have CFS, fatigue and other symptoms can make it hard for you to get through your day. There is no treatment or cure. You will need to work closely with your health care provider to come up with a treatment plan that works for you. CAUSES  No one knows what causes CFS. It may be triggered by a flu-like illness or by mono. Other triggers may include:  An abnormal immune system.  Low blood pressure.  Poor diet.  Physical or emotional stress. SIGNS AND SYMPTOMS The main symptom is fatigue that lasts all day, especially after physical or mental stress. Other common symptoms include:  An extreme loss of energy with no obvious cause.  Muscle or joint soreness.  Severe weakness.  Frequent headaches.  Fever.  Sore throat.  Swollen lymph glands.  Sleep is not refreshing.  Loss of concentration or memory. Less common symptoms may include:  Chills.  Night sweats.  Tingling or numbness.  Blurred vision.  Dizziness.  Sensitivity to noise or odors.  Mood swings.  Anxiety, panic attacks, and depression. Your symptoms may come and go, or you may have them all the time. DIAGNOSIS  There are no tests that can help health care providers diagnose CFS. It may take a long time for you to get a correct diagnosis. Your health care provider may need to do a number of tests to rule out other conditions that could be causing your symptoms. You may be diagnosed with CFS if:  You have fatigue that has lasted for at least six months.  Your fatigue is not relieved by rest.  Your fatigue is not caused by another condition.  Your fatigue is severe enough to interfere with work and daily activities.  You have at least four common symptoms of CFS. TREATMENT  There is no  cure for CFS at this time. The condition affects everyone differently. You will need to work with your health care provider to find the best treatment for your symptoms. Treatment may include:  Improving sleep with a regular bedtime routine.  Avoiding caffeine, alcohol, and tobacco.  Doing light exercise and stretching during the day.  Taking medicine to help you sleep.  Taking over-the-counter medicines to relieve joint or muscle pain.  Learning and practicing relaxation techniques.  Using memory aids or doing brain teasers to improve memory and concentration.  Seeing a mental health professional to evaluate and treat depression, if necessary.  Trying massage therapy, acupuncture, and movement exercises, like yoga or tai chi. HOME CARE INSTRUCTIONS Work closely with your health care provider to follow your treatment plan at home. You may need to make major lifestyle changes. If treatment does not seem to help, get a second opinion. You may get help from many health care providers, including doctors, mental health specialists, physical therapists, and rehabilitation therapists. Having the support of friends and loved ones is also important. SEEK MEDICAL CARE IF:  Your symptoms are not responding to treatment.  You are having strong feelings of anger, guilt, anxiety, or depression.   This information is not intended to replace advice given to you by your health care provider. Make sure you discuss any questions you have with your health care provider.   Document Released: 08/25/2004 Document Revised: 08/08/2014 Document Reviewed: 06/07/2013 Elsevier Interactive Patient   Education 2016 [employer].

## 2016-04-25 NOTE — Telephone Encounter (Signed)
Please review. Thanks!  

## 2016-04-26 ENCOUNTER — Telehealth: Payer: Self-pay

## 2016-04-26 LAB — IRON AND TIBC
Iron Saturation: 21 % (ref 15–55)
Iron: 75 ug/dL (ref 27–159)
Total Iron Binding Capacity: 356 ug/dL (ref 250–450)
UIBC: 281 ug/dL (ref 131–425)

## 2016-04-26 LAB — CBC WITH DIFFERENTIAL/PLATELET
Basophils Absolute: 0 10*3/uL (ref 0.0–0.2)
Basos: 0 %
EOS (ABSOLUTE): 0.1 10*3/uL (ref 0.0–0.4)
Eos: 1 %
Hematocrit: 41.3 % (ref 34.0–46.6)
Hemoglobin: 14.3 g/dL (ref 11.1–15.9)
Immature Grans (Abs): 0 10*3/uL (ref 0.0–0.1)
Immature Granulocytes: 0 %
Lymphocytes Absolute: 1.9 10*3/uL (ref 0.7–3.1)
Lymphs: 27 %
MCH: 31.1 pg (ref 26.6–33.0)
MCHC: 34.6 g/dL (ref 31.5–35.7)
MCV: 90 fL (ref 79–97)
Monocytes Absolute: 0.5 10*3/uL (ref 0.1–0.9)
Monocytes: 8 %
Neutrophils Absolute: 4.6 10*3/uL (ref 1.4–7.0)
Neutrophils: 64 %
Platelets: 288 10*3/uL (ref 150–379)
RBC: 4.6 x10E6/uL (ref 3.77–5.28)
RDW: 13.9 % (ref 12.3–15.4)
WBC: 7.2 10*3/uL (ref 3.4–10.8)

## 2016-04-26 LAB — TSH: TSH: 6.72 u[IU]/mL — ABNORMAL HIGH (ref 0.450–4.500)

## 2016-04-26 LAB — VITAMIN B12: Vitamin B-12: 444 pg/mL (ref 211–946)

## 2016-04-26 LAB — VITAMIN D 25 HYDROXY (VIT D DEFICIENCY, FRACTURES): Vit D, 25-Hydroxy: 11.9 ng/mL — ABNORMAL LOW (ref 30.0–100.0)

## 2016-04-26 MED ORDER — VITAMIN D (ERGOCALCIFEROL) 1.25 MG (50000 UNIT) PO CAPS: 50000.0000 [IU] | ORAL_CAPSULE | ORAL | 0 refills | Status: DC

## 2016-04-26 MED ORDER — LEVOTHYROXINE SODIUM 75 MCG PO TABS: 75.0000 ug | ORAL_TABLET | Freq: Every day | ORAL | 0 refills | Status: DC

## 2016-04-26 NOTE — Addendum Note (Signed)
Addended by: Mar Daring on: 04/26/2016 09:54 AM   Modules accepted: Orders

## 2016-04-26 NOTE — Telephone Encounter (Signed)
-----   Message from Mar Daring, [place] sent at 04/26/2016  9:52 AM EDT ----- Vit d is very low and TSH is off as well. Will give vit d supplement Rx and levothyroxine Rx. These could very well affect energy. Will recheck both in 8 weeks following starting replacement.

## 2016-04-26 NOTE — Telephone Encounter (Signed)
Patient advised as below. Patient verbalizes understanding and is in agreement with treatment plan.  

## 2016-04-27 ENCOUNTER — Telehealth: Payer: Self-pay | Admitting: *Deleted

## 2016-04-27 NOTE — Telephone Encounter (Signed)
Received referral for initial lung cancer screening scan. Contacted patient and obtained smoking history,(current, 45 pack year ) as well as answering questions related to screening process. Patient denies signs of lung cancer such as weight loss or hemoptysis. Patient denies comorbidity that would prevent curative treatment if lung cancer were found. Patient is tentatively scheduled for shared decision making visit and CT scan on 05/03/16 at 1:30pm, pending insurance approval from business office.

## 2016-05-02 ENCOUNTER — Other Ambulatory Visit: Payer: Self-pay | Admitting: *Deleted

## 2016-05-02 DIAGNOSIS — Z87891 Personal history of nicotine dependence: Secondary | ICD-10-CM

## 2016-05-03 ENCOUNTER — Inpatient Hospital Stay: Payer: BLUE CROSS/BLUE SHIELD | Attending: Oncology | Admitting: Oncology

## 2016-05-03 ENCOUNTER — Ambulatory Visit
Admission: RE | Admit: 2016-05-03 | Discharge: 2016-05-03 | Disposition: A | Discharge status: Home / Self Care | Payer: BLUE CROSS/BLUE SHIELD | Source: Ambulatory Visit | Attending: Oncology | Admitting: Oncology

## 2016-05-03 ENCOUNTER — Ambulatory Visit: Payer: BC Managed Care – PPO | Admitting: Oncology

## 2016-05-03 DIAGNOSIS — I700 Atherosclerosis of aorta: Secondary | ICD-10-CM | POA: Diagnosis not present

## 2016-05-03 DIAGNOSIS — J439 Emphysema, unspecified: Secondary | ICD-10-CM | POA: Insufficient documentation

## 2016-05-03 DIAGNOSIS — R911 Solitary pulmonary nodule: Secondary | ICD-10-CM | POA: Insufficient documentation

## 2016-05-03 DIAGNOSIS — F17210 Nicotine dependence, cigarettes, uncomplicated: Secondary | ICD-10-CM | POA: Insufficient documentation

## 2016-05-03 DIAGNOSIS — Z122 Encounter for screening for malignant neoplasm of respiratory organs: Secondary | ICD-10-CM | POA: Diagnosis not present

## 2016-05-03 DIAGNOSIS — Z87891 Personal history of nicotine dependence: Secondary | ICD-10-CM | POA: Insufficient documentation

## 2016-05-03 NOTE — Progress Notes (Signed)
In accordance with CMS guidelines, patient has met eligibility criteria including age, absence of signs or symptoms of lung cancer.  Social History  Substance Use Topics  . Smoking status: Current Every Day Smoker    Packs/day: 1.00    Years: 45.00    Types: Cigarettes    Start date: 03/02/1980  . Smokeless tobacco: Never Used     Comment: every other day  . Alcohol use No     A shared decision-making session was conducted prior to the performance of CT scan. This includes one or more decision aids, includes benefits and harms of screening, follow-up diagnostic testing, over-diagnosis, false positive rate, and total radiation exposure.  Counseling on the importance of adherence to annual lung cancer LDCT screening, impact of co-morbidities, and ability or willingness to undergo diagnosis and treatment is imperative for compliance of the program.  Counseling on the importance of continued smoking cessation for former smokers; the importance of smoking cessation for current smokers, and information about tobacco cessation interventions have been given to patient including [place] and [facility] quit [place] programs.  Written order for lung cancer screening with LDCT has been given to the patient and any and all questions have been answered to the best of my abilities.   Yearly follow up will be coordinated by Burgess Estelle, Thoracic Navigator.

## 2016-05-05 ENCOUNTER — Telehealth: Payer: Self-pay | Admitting: *Deleted

## 2016-05-05 NOTE — Telephone Encounter (Signed)
Notified patient of LDCT lung cancer screening results with recommendation for 12 month follow up imaging. Also notified of incidental finding noted below. Patient verbalizes understanding.   IMPRESSION: 1. Lung-RADS Category 2, benign appearance or behavior. Continue annual screening with low-dose chest CT without contrast in 12 months 2. Diffuse bronchial wall thickening with emphysema, as above; imaging findings suggestive of underlying COPD. 3. Aortic atherosclerosis

## 2016-05-10 ENCOUNTER — Telehealth: Payer: Self-pay | Admitting: Physician Assistant

## 2016-05-10 DIAGNOSIS — Z23 Encounter for immunization: Secondary | ICD-10-CM

## 2016-05-10 NOTE — Telephone Encounter (Signed)
Pt is requesting to have the pneumonia and shingle shot.  Please advise if pt can have can have these.  [phone]

## 2016-05-10 NOTE — Telephone Encounter (Signed)
No DM, but does smoke with H/O chronic bronchitis and airway hyperactivity. Is this okay? Renaldo Fiddler, CMA

## 2016-05-11 MED ORDER — PNEUMOCOCCAL VAC POLYVALENT 25 MCG/0.5ML IJ INJ: 0.5000 mL | INJECTION | Freq: Once | INTRAMUSCULAR | 0 refills | Status: AC

## 2016-05-11 MED ORDER — ZOSTER VACCINE LIVE 19400 UNT/0.65ML ~~LOC~~ SUSR: 0.6500 mL | Freq: Once | SUBCUTANEOUS | 0 refills | Status: AC

## 2016-05-11 NOTE — Telephone Encounter (Signed)
Yes ok for both and both recommended for patients with COPD. Make sure she has had flu vaccine as well.

## 2016-05-11 NOTE — Telephone Encounter (Signed)
Per pt, she needs a prescription sent to the pharmacy. Sent in rx and advised pt to call if there are any problems with the prescription. Renaldo Fiddler, CMA

## 2016-06-15 ENCOUNTER — Other Ambulatory Visit: Payer: Self-pay

## 2016-06-15 MED ORDER — TIZANIDINE HCL 4 MG PO CAPS: 4.0000 mg | ORAL_CAPSULE | Freq: Three times a day (TID) | ORAL | 6 refills | Status: DC

## 2016-06-15 NOTE — Telephone Encounter (Signed)
04/12/16 last ov

## 2016-06-17 ENCOUNTER — Other Ambulatory Visit: Payer: Self-pay

## 2016-06-17 MED ORDER — QUETIAPINE FUMARATE ER 50 MG PO TB24: 50.0000 mg | ORAL_TABLET | Freq: Every day | ORAL | 6 refills | Status: DC

## 2016-06-17 NOTE — Telephone Encounter (Signed)
Refill request for Seroquel XR 50 mg . Last Rx written 08/14/15  Thanks,  -Joseline

## 2016-06-20 ENCOUNTER — Telehealth: Payer: Self-pay

## 2016-06-20 ENCOUNTER — Other Ambulatory Visit: Payer: Self-pay

## 2016-06-20 DIAGNOSIS — R059 Cough, unspecified: Secondary | ICD-10-CM

## 2016-06-20 DIAGNOSIS — R05 Cough: Secondary | ICD-10-CM

## 2016-06-20 DIAGNOSIS — E559 Vitamin D deficiency, unspecified: Secondary | ICD-10-CM

## 2016-06-20 MED ORDER — VITAMIN D (ERGOCALCIFEROL) 1.25 MG (50000 UNIT) PO CAPS: 50000.0000 [IU] | ORAL_CAPSULE | ORAL | 0 refills | Status: DC

## 2016-06-20 MED ORDER — BENZONATATE 200 MG PO CAPS: 200.0000 mg | ORAL_CAPSULE | Freq: Three times a day (TID) | ORAL | 3 refills | Status: DC | PRN

## 2016-06-20 NOTE — Telephone Encounter (Signed)
Pharmacy requesting refill for benzonatate 200 mg

## 2016-07-21 ENCOUNTER — Other Ambulatory Visit: Payer: Self-pay

## 2016-07-21 DIAGNOSIS — M545 Low back pain, unspecified: Secondary | ICD-10-CM

## 2016-07-21 MED ORDER — MELOXICAM 15 MG PO TABS: 15.0000 mg | ORAL_TABLET | Freq: Every day | ORAL | 6 refills | Status: DC

## 2016-07-28 ENCOUNTER — Ambulatory Visit (INDEPENDENT_AMBULATORY_CARE_PROVIDER_SITE_OTHER): Payer: BLUE CROSS/BLUE SHIELD | Admitting: Physician Assistant

## 2016-07-28 VITALS — BP 160/90 | HR 100 | Temp 97.9°F | Resp 20 | Wt 239.0 lb

## 2016-07-28 DIAGNOSIS — Z716 Tobacco abuse counseling: Secondary | ICD-10-CM

## 2016-07-28 DIAGNOSIS — Z720 Tobacco use: Secondary | ICD-10-CM

## 2016-07-28 DIAGNOSIS — E559 Vitamin D deficiency, unspecified: Secondary | ICD-10-CM

## 2016-07-28 DIAGNOSIS — E039 Hypothyroidism, unspecified: Secondary | ICD-10-CM | POA: Diagnosis not present

## 2016-07-28 DIAGNOSIS — F331 Major depressive disorder, recurrent, moderate: Secondary | ICD-10-CM | POA: Diagnosis not present

## 2016-07-28 MED ORDER — VARENICLINE TARTRATE 0.5 MG X 11 & 1 MG X 42 PO MISC: ORAL | 0 refills | Status: DC

## 2016-07-28 MED ORDER — QUETIAPINE FUMARATE 25 MG PO TABS: ORAL_TABLET | ORAL | 0 refills | Status: DC

## 2016-07-28 NOTE — Patient Instructions (Signed)
Steps to Quit Smoking Smoking tobacco can be bad for your health. It can also affect almost every organ in your body. Smoking puts you and people around you at risk for many serious long-lasting (chronic) diseases. Quitting smoking is hard, but it is one of the best things that you can do for your health. It is never too late to quit. What are the benefits of quitting smoking? When you quit smoking, you lower your risk for getting serious diseases and conditions. They can include:  Lung cancer or lung disease.  Heart disease.  Stroke.  Heart attack.  Not being able to have children (infertility).  Weak bones (osteoporosis) and broken bones (fractures). If you have coughing, wheezing, and shortness of breath, those symptoms may get better when you quit. You may also get sick less often. If you are pregnant, quitting smoking can help to lower your chances of having a baby of low birth weight. What can I do to help me quit smoking? Talk with your doctor about what can help you quit smoking. Some things you can do (strategies) include:  Quitting smoking totally, instead of slowly cutting back how much you smoke over a period of time.  Going to in-person counseling. You are more likely to quit if you go to many counseling sessions.  Using resources and support systems, such as:  Online chats with a counselor.  Phone quitlines.  Printed self-help materials.  Support groups or group counseling.  Text messaging programs.  Mobile phone apps or applications.  Taking medicines. Some of these medicines may have nicotine in them. If you are pregnant or breastfeeding, do not take any medicines to quit smoking unless your doctor says it is okay. Talk with your doctor about counseling or other things that can help you. Talk with your doctor about using more than one strategy at the same time, such as taking medicines while you are also going to in-person counseling. This can help make quitting  easier. What things can I do to make it easier to quit? Quitting smoking might feel very hard at first, but there is a lot that you can do to make it easier. Take these steps:  Talk to your family and friends. Ask them to support and encourage you.  Call phone quitlines, reach out to support groups, or work with a counselor.  Ask people who smoke to not smoke around you.  Avoid places that make you want (trigger) to smoke, such as:  Bars.  Parties.  Smoke-break areas at work.  Spend time with people who do not smoke.  Lower the stress in your life. Stress can make you want to smoke. Try these things to help your stress:  Getting regular exercise.  Deep-breathing exercises.  Yoga.  Meditating.  Doing a body scan. To do this, close your eyes, focus on one area of your body at a time from head to toe, and notice which parts of your body are tense. Try to relax the muscles in those areas.  Download or buy apps on your mobile phone or tablet that can help you stick to your quit plan. There are many free apps, such as QuitGuide from the CDC (Centers for Disease Control and Prevention). You can find more support from smokefree.gov and other websites. This information is not intended to replace advice given to you by your health care provider. Make sure you discuss any questions you have with your health care provider. Document Released: 05/14/2009 Document Revised: 03/15/2016 Document   Reviewed: 12/02/2014 Elsevier Interactive Patient Education  2017 [employer] Inc.  

## 2016-07-28 NOTE — Progress Notes (Signed)
Patient: Linda Bradford Female    DOB: 1955/11/21   60 y.o.   MRN: [id number] Visit Date: 07/28/2016  Today's Provider: Mar Daring, PA-C   Chief Complaint  Patient presents with  . Vitamin D Deficiency  . Hypothyroidism  . Depression   Subjective:    HPI     Follow up for Vitamin D Deficiency  The patient was last seen for this 3 months ago. Changes made at last visit include adding Vitamin D 50,000 IU once weekly.  She reports excellent compliance with treatment. She feels that condition is Improved. She is not having side effects.   ------------------------------------------------------------------------------------   Hypothyroid, follow-up:  TSH  Date Value Ref Range Status  04/25/2016 6.720 (H) 0.450 - 4.500 uIU/mL Final   Wt Readings from Last 3 Encounters:  07/28/16 239 lb (108.4 kg)  05/03/16 235 lb (106.6 kg)  04/25/16 239 lb (108.4 kg)    She was last seen for hypothyroid 3 months ago.  Management since that visit includes adding levothyroxine 75 mcg. She reports excellent compliance with treatment. She is not having side effects.  She is exercising twice weekly. Walks for about 30 minutes. She is experiencing palpitations She denies change in energy level, diarrhea, heat / cold intolerance, nervousness and weight changes Weight trend: fluctuating a bit  ------------------------------------------------------------------------ Depression Pt would like to discuss discontinuing Seroquel. Pt reports she feels "druggy" when she wakes up in the mornings. Pt reports she does not want to take as many medications. She was started on this medication for mood stabilization by Dr. Jimmye Norman last year after an acute issue. She feels better now and would like to discontinue. Pt would also like to dicontinue Meloxicam and Advair. She is using Spiriva with success. She did discuss with me today that she is coping well but did have a difficult time over the  holidays because both of her sons are IV drug users (one uses meth and one uses crack). She herself has been clean from alcohol for 23 years. She continues to go to [employer]. She has previously been to NA meetings for her sons and is interested in attending these again because they help her cope.   Allergies  Allergen Reactions  . Dexilant [Dexlansoprazole] Nausea Only and Other (See Comments)    Dizziness     Current Outpatient Prescriptions:  .  acetaminophen (TYLENOL) 500 MG tablet, Take 1,000 mg by mouth every 6 (six) hours as needed., Disp: , Rfl:  .  albuterol (PROVENTIL HFA;VENTOLIN HFA) 108 (90 Base) MCG/ACT inhaler, Inhale 1 puff into the lungs every 4 (four) hours as needed., Disp: 1 Inhaler, Rfl: 11 .  benzonatate (TESSALON) 200 MG capsule, Take 1 capsule (200 mg total) by mouth 3 (three) times daily as needed for cough., Disp: 90 capsule, Rfl: 3 .  clonazePAM (KLONOPIN) 0.5 MG tablet, Take 0.5 tablets (0.25 mg total) by mouth 2 (two) times daily as needed for anxiety. If 1/2 tablet is not effective can take one whole tablet twice daily., Disp: 60 tablet, Rfl: 5 .  Cyanocobalamin (RA VITAMIN B-12 TR) 1000 MCG TBCR, Take by mouth., Disp: , Rfl:  .  DULoxetine (CYMBALTA) 60 MG capsule, Take 1 capsule (60 mg total) by mouth 2 (two) times daily., Disp: 60 capsule, Rfl: 6 .  estradiol (ESTRACE) 0.5 MG tablet, Take 1 tablet (0.5 mg total) by mouth daily., Disp: 30 tablet, Rfl: 6 .  fexofenadine-pseudoephedrine (ALLEGRA-D ALLERGY & CONGESTION) 180-240 MG 24  hr tablet, Take 1 tablet by mouth daily., Disp: 30 tablet, Rfl: 3 .  fluticasone (FLONASE) 50 MCG/ACT nasal spray, Place 2 sprays into both nostrils daily., Disp: 16 g, Rfl: 11 .  gabapentin (NEURONTIN) 300 MG capsule, Take 4 capsules (1,200 mg total) by mouth at bedtime., Disp: 120 capsule, Rfl: 6 .  ipratropium-albuterol (DUONEB) 0.5-2.5 (3) MG/3ML SOLN, Take 3 mLs by nebulization every 6 (six) hours as needed., Disp: 360 mL, Rfl:  11 .  levothyroxine (SYNTHROID, LEVOTHROID) 75 MCG tablet, Take 1 tablet (75 mcg total) by mouth daily., Disp: 90 tablet, Rfl: 0 .  medroxyPROGESTERone (PROVERA) 2.5 MG tablet, Take 1 tablet (2.5 mg total) by mouth daily., Disp: 30 tablet, Rfl: 6 .  meloxicam (MOBIC) 15 MG tablet, Take 1 tablet (15 mg total) by mouth daily., Disp: 30 tablet, Rfl: 6 .  QUEtiapine (SEROQUEL XR) 50 MG TB24 24 hr tablet, Take 1 tablet (50 mg total) by mouth at bedtime., Disp: 30 tablet, Rfl: 6 .  theophylline (THEO-24) 300 MG 24 hr capsule, Take 1 capsule (300 mg total) by mouth daily., Disp: 30 capsule, Rfl: 5 .  tiotropium (SPIRIVA) 18 MCG inhalation capsule, Place 1 capsule (18 mcg total) into inhaler and inhale daily., Disp: 30 capsule, Rfl: 11 .  tiZANidine (ZANAFLEX) 4 MG capsule, Take 1 capsule (4 mg total) by mouth 3 (three) times daily., Disp: 90 capsule, Rfl: 6 .  Vitamin D, Ergocalciferol, (DRISDOL) 50000 units CAPS capsule, Take 1 capsule (50,000 Units total) by mouth every 7 (seven) days., Disp: 12 capsule, Rfl: 0 .  aspirin 81 MG tablet, Take 81 mg by mouth., Disp: , Rfl:  .  Fluticasone-Salmeterol (ADVAIR DISKUS) 250-50 MCG/DOSE AEPB, Inhale 1 puff into the lungs daily. (Patient not taking: Reported on 07/28/2016), Disp: 60 each, Rfl: 6  Review of Systems  Constitutional: Negative for activity change, appetite change, chills, diaphoresis, fatigue, fever and unexpected weight change.  Respiratory: Positive for cough (chronic per pt), shortness of breath and wheezing.   Cardiovascular: Positive for palpitations. Negative for chest pain and leg swelling.  Gastrointestinal: Negative for abdominal pain.  Endocrine: Negative for cold intolerance and heat intolerance.  Musculoskeletal: Positive for myalgias.    Social History  Substance Use Topics  . Smoking status: Current Every Day Smoker    Packs/day: 1.00    Years: 45.00    Types: Cigarettes    Start date: 03/02/1980  . Smokeless tobacco: Never Used      Comment: every other day  . Alcohol use No   Objective:   BP (!) 150/88 (BP Location: Left Arm, Patient Position: Sitting, Cuff Size: Large)   Pulse (!) 104   Temp 97.9 F (36.6 C) (Oral)   Resp 20   Wt 239 lb (108.4 kg)   SpO2 96%   BMI 34.29 kg/m   Physical Exam  Constitutional: She appears well-developed and well-nourished. No distress.  Neck: Normal range of motion. Neck supple. No JVD present. No tracheal deviation present. No thyromegaly present.  Cardiovascular: Normal rate, regular rhythm and normal heart sounds.  Exam reveals no gallop and no friction rub.   No murmur heard. Pulmonary/Chest: Effort normal and breath sounds normal. No respiratory distress. She has no wheezes. She has no rales.  Lymphadenopathy:    She has no cervical adenopathy.  Skin: She is not diaphoretic.  Psychiatric: She has a normal mood and affect. Her behavior is normal. Judgment and thought content normal.  Vitals reviewed.     Assessment &  Plan:     1. Depression, major, recurrent, moderate ([place]) Will slowly taper off seroquel as below. She is to call if she has any complications. She is also going to increase her Cymbalta to 60mg  BID. She reports she has only been taking once daily at this time. I will see her back in 3 months. - QUEtiapine (SEROQUEL) 25 MG tablet; Take 1.5 tablets PO q h.s. X 1 week, then 1 tab PO q h.s. X 1 week, then 0.5 tab PO q.h.s x 1 week, then discontinue  Dispense: 21 tablet; Refill: 0  2. Vitamin D deficiency H/O deficiency requiring high dose supplementation. Will check labs as below and f/u pending results. - Vitamin D (25 hydroxy)  3. Hypothyroidism, unspecified type Previously required increase of levothyroxine to 77mcg. Will check labs as below and f/u pending results. - Thyroid Panel With TSH  4. Encounter for smoking cessation counseling Patient is wishing to try to quit smoking again. Will give Chantix as below.  - varenicline (CHANTIX STARTING  MONTH PAK) 0.5 MG X 11 & 1 MG X 42 tablet; Take one 0.5 mg tablet by mouth once daily for 3 days, then increase to one 0.5 mg tablet twice daily for 4 days, then increase to one 1 mg tablet twice daily.  Dispense: 53 tablet; Refill: 0     Patient seen and examined by Mar Daring, PA-C, and note scribed by Renaldo Fiddler, CMA.  Mar Daring, PA-C  [facility]

## 2016-08-04 ENCOUNTER — Other Ambulatory Visit: Payer: Self-pay

## 2016-08-04 DIAGNOSIS — E039 Hypothyroidism, unspecified: Secondary | ICD-10-CM

## 2016-08-04 MED ORDER — LEVOTHYROXINE SODIUM 75 MCG PO TABS: 75.0000 ug | ORAL_TABLET | Freq: Every day | ORAL | 1 refills | Status: DC

## 2016-08-24 ENCOUNTER — Other Ambulatory Visit: Payer: Self-pay

## 2016-08-24 DIAGNOSIS — N952 Postmenopausal atrophic vaginitis: Secondary | ICD-10-CM

## 2016-08-24 MED ORDER — ESTRADIOL 0.5 MG PO TABS: 0.5000 mg | ORAL_TABLET | Freq: Every day | ORAL | 6 refills | Status: DC

## 2016-08-24 MED ORDER — MEDROXYPROGESTERONE ACETATE 2.5 MG PO TABS: 2.5000 mg | ORAL_TABLET | Freq: Every day | ORAL | 6 refills | Status: DC

## 2016-08-24 NOTE — Telephone Encounter (Signed)
Pharmacy requesting refills. Thanks!  

## 2016-09-15 ENCOUNTER — Other Ambulatory Visit: Payer: Self-pay

## 2016-09-15 DIAGNOSIS — E559 Vitamin D deficiency, unspecified: Secondary | ICD-10-CM

## 2016-09-15 DIAGNOSIS — F419 Anxiety disorder, unspecified: Secondary | ICD-10-CM

## 2016-09-15 MED ORDER — CLONAZEPAM 0.5 MG PO TABS: 0.2500 mg | ORAL_TABLET | Freq: Two times a day (BID) | ORAL | 5 refills | Status: DC | PRN

## 2016-09-15 MED ORDER — VITAMIN D (ERGOCALCIFEROL) 1.25 MG (50000 UNIT) PO CAPS: 50000.0000 [IU] | ORAL_CAPSULE | ORAL | 0 refills | Status: DC

## 2016-09-15 NOTE — Telephone Encounter (Signed)
Pharmacy requesting refill Last ov 07/28/16 Last filled 12/18/15. Please review. Thank you. sd

## 2016-09-15 NOTE — Telephone Encounter (Signed)
Clonazepam called into [facility]

## 2016-09-15 NOTE — Telephone Encounter (Signed)
Vit d last refilled 06/20/16.

## 2016-09-23 ENCOUNTER — Other Ambulatory Visit: Payer: Self-pay

## 2016-09-23 DIAGNOSIS — J449 Chronic obstructive pulmonary disease, unspecified: Secondary | ICD-10-CM

## 2016-09-23 MED ORDER — THEOPHYLLINE ER 300 MG PO CP24: 300.0000 mg | ORAL_CAPSULE | Freq: Every day | ORAL | 5 refills | Status: DC

## 2016-09-23 NOTE — Telephone Encounter (Signed)
Pharmacy requesting refills.

## 2016-10-06 DIAGNOSIS — M170 Bilateral primary osteoarthritis of knee: Secondary | ICD-10-CM | POA: Diagnosis not present

## 2016-10-10 ENCOUNTER — Encounter: Payer: Self-pay | Admitting: Physician Assistant

## 2016-10-10 ENCOUNTER — Ambulatory Visit (INDEPENDENT_AMBULATORY_CARE_PROVIDER_SITE_OTHER): Payer: BLUE CROSS/BLUE SHIELD | Admitting: Physician Assistant

## 2016-10-10 VITALS — BP 142/72 | HR 96 | Temp 98.3°F | Resp 20 | Ht 71.0 in | Wt 236.8 lb

## 2016-10-10 DIAGNOSIS — I10 Essential (primary) hypertension: Secondary | ICD-10-CM

## 2016-10-10 DIAGNOSIS — Z9109 Other allergy status, other than to drugs and biological substances: Secondary | ICD-10-CM

## 2016-10-10 MED ORDER — LOSARTAN POTASSIUM 50 MG PO TABS: 50.0000 mg | ORAL_TABLET | Freq: Every day | ORAL | 0 refills | Status: DC

## 2016-10-10 MED ORDER — FEXOFENADINE HCL 180 MG PO TABS: 180.0000 mg | ORAL_TABLET | Freq: Every day | ORAL | 1 refills | Status: DC

## 2016-10-10 MED ORDER — FLUTICASONE PROPIONATE 50 MCG/ACT NA SUSP: 2.0000 | Freq: Every day | NASAL | 1 refills | Status: DC

## 2016-10-10 NOTE — Patient Instructions (Addendum)
Hypertension °Hypertension is another name for high blood pressure. High blood pressure forces your heart to work harder to pump blood. This can cause problems over time. °There are two numbers in a blood pressure reading. There is a top number (systolic) over a bottom number (diastolic). It is best to have a blood pressure below 120/80. Healthy choices can help lower your blood pressure. You may need medicine to help lower your blood pressure if: °· Your blood pressure cannot be lowered with healthy choices. °· Your blood pressure is higher than 130/80. °Follow these instructions at home: °Eating and drinking  °· If directed, follow the DASH eating plan. This diet includes: °¨ Filling half of your plate at each meal with fruits and vegetables. °¨ Filling one quarter of your plate at each meal with whole grains. Whole grains include whole wheat pasta, brown rice, and whole grain bread. °¨ Eating or drinking low-fat dairy products, such as skim milk or low-fat yogurt. °¨ Filling one quarter of your plate at each meal with low-fat (lean) proteins. Low-fat proteins include fish, skinless chicken, eggs, beans, and tofu. °¨ Avoiding fatty meat, cured and processed meat, or chicken with skin. °¨ Avoiding premade or processed food. °· Eat less than 1,500 mg of salt (sodium) a day. °· Limit alcohol use to no more than 1 drink a day for nonpregnant women and 2 drinks a day for men. One drink equals 12 oz of beer, 5 oz of wine, or 1½ oz of hard liquor. °Lifestyle  °· Work with your doctor to stay at a healthy weight or to lose weight. Ask your doctor what the best weight is for you. °· Get at least 30 minutes of exercise that causes your heart to beat faster (aerobic exercise) most days of the week. This may include walking, swimming, or biking. °· Get at least 30 minutes of exercise that strengthens your muscles (resistance exercise) at least 3 days a week. This may include lifting weights or pilates. °· Do not use any  products that contain nicotine or tobacco. This includes cigarettes and e-cigarettes. If you need help quitting, ask your doctor. °· Check your blood pressure at home as told by your doctor. °· Keep all follow-up visits as told by your doctor. This is important. °Medicines  °· Take over-the-counter and prescription medicines only as told by your doctor. Follow directions carefully. °· Do not skip doses of blood pressure medicine. The medicine does not work as well if you skip doses. Skipping doses also puts you at risk for problems. °· Ask your doctor about side effects or reactions to medicines that you should watch for. °Contact a doctor if: °· You think you are having a reaction to the medicine you are taking. °· You have headaches that keep coming back (recurring). °· You feel dizzy. °· You have swelling in your ankles. °· You have trouble with your vision. °Get help right away if: °· You get a very bad headache. °· You start to feel confused. °· You feel weak or numb. °· You feel faint. °· You get very bad pain in your: °¨ Chest. °¨ Belly (abdomen). °· You throw up (vomit) more than once. °· You have trouble breathing. °Summary °· Hypertension is another name for high blood pressure. °· Making healthy choices can help lower blood pressure. If your blood pressure cannot be controlled with healthy choices, you may need to take medicine. °This information is not intended to replace advice given to you by your   health care provider. Make sure you discuss any questions you have with your health care provider. Document Released: 01/04/2008 Document Revised: 06/15/2016 Document Reviewed: 06/15/2016 Elsevier Interactive Patient Education  2017 [place] DASH stands for "Dietary Approaches to Stop Hypertension." The DASH eating plan is a healthy eating plan that has been shown to reduce high blood pressure (hypertension). It may also reduce your risk for type 2 diabetes, heart disease, and  stroke. The DASH eating plan may also help with weight loss. What are tips for following this plan? General guidelines   Avoid eating more than 2,300 mg (milligrams) of salt (sodium) a day. If you have hypertension, you may need to reduce your sodium intake to 1,500 mg a day.  Limit alcohol intake to no more than 1 drink a day for nonpregnant women and 2 drinks a day for men. One drink equals 12 oz of beer, 5 oz of wine, or 1 oz of hard liquor.  Work with your health care provider to maintain a healthy body weight or to lose weight. Ask what an ideal weight is for you.  Get at least 30 minutes of exercise that causes your heart to beat faster (aerobic exercise) most days of the week. Activities may include walking, swimming, or biking.  Work with your health care provider or diet and nutrition specialist (dietitian) to adjust your eating plan to your individual calorie needs. Reading food labels   Check food labels for the amount of sodium per serving. Choose foods with less than 5 percent of the Daily Value of sodium. Generally, foods with less than 300 mg of sodium per serving fit into this eating plan.  To find whole grains, look for the word "whole" as the first word in the ingredient list. Shopping   Buy products labeled as "low-sodium" or "no salt added."  Buy fresh foods. Avoid canned foods and premade or frozen meals. Cooking   Avoid adding salt when cooking. Use salt-free seasonings or herbs instead of table salt or sea salt. Check with your health care provider or pharmacist before using salt substitutes.  Do not fry foods. Cook foods using healthy methods such as baking, boiling, grilling, and broiling instead.  Cook with heart-healthy oils, such as olive, canola, soybean, or sunflower oil. Meal planning    Eat a balanced diet that includes:  5 or more servings of fruits and vegetables each day. At each meal, try to fill half of your plate with fruits and  vegetables.  Up to 6-8 servings of whole grains each day.  Less than 6 oz of lean meat, poultry, or fish each day. A 3-oz serving of meat is about the same size as a deck of cards. One egg equals 1 oz.  2 servings of low-fat dairy each day.  A serving of nuts, seeds, or beans 5 times each week.  Heart-healthy fats. Healthy fats called Omega-3 fatty acids are found in foods such as flaxseeds and coldwater fish, like sardines, salmon, and mackerel.  Limit how much you eat of the following:  Canned or prepackaged foods.  Food that is high in trans fat, such as fried foods.  Food that is high in saturated fat, such as fatty meat.  Sweets, desserts, sugary drinks, and other foods with added sugar.  Full-fat dairy products.  Do not salt foods before eating.  Try to eat at least 2 vegetarian meals each week.  Eat more home-cooked food and less restaurant, buffet, and fast food.  When eating at a restaurant, ask that your food be prepared with less salt or no salt, if possible. What foods are recommended? The items listed may not be a complete list. Talk with your dietitian about what dietary choices are best for you. Grains  Whole-grain or whole-wheat bread. Whole-grain or whole-wheat pasta. Brown rice. [place]. Bulgur. Whole-grain and low-sodium cereals. Pita bread. Low-fat, low-sodium crackers. Whole-wheat flour tortillas. Vegetables  Fresh or frozen vegetables (raw, steamed, roasted, or grilled). Low-sodium or reduced-sodium tomato and vegetable juice. Low-sodium or reduced-sodium tomato sauce and tomato paste. Low-sodium or reduced-sodium canned vegetables. Fruits  All fresh, dried, or frozen fruit. Canned fruit in natural juice (without added sugar). Meat and other protein foods  Skinless chicken or [place]. Ground chicken or [place]. Pork with fat trimmed off. Fish and seafood. Egg whites. Dried beans, peas, or lentils. Unsalted nuts, nut butters, and seeds. Unsalted  canned beans. Lean cuts of beef with fat trimmed off. Low-sodium, lean deli meat. Dairy  Low-fat (1%) or fat-free (skim) milk. Fat-free, low-fat, or reduced-fat cheeses. Nonfat, low-sodium ricotta or cottage cheese. Low-fat or nonfat yogurt. Low-fat, low-sodium cheese. Fats and oils  Soft margarine without trans fats. Vegetable oil. Low-fat, reduced-fat, or light mayonnaise and salad dressings (reduced-sodium). Canola, safflower, olive, soybean, and sunflower oils. Avocado. Seasoning and other foods  Herbs. Spices. Seasoning mixes without salt. Unsalted popcorn and pretzels. Fat-free sweets. What foods are not recommended? The items listed may not be a complete list. Talk with your dietitian about what dietary choices are best for you. Grains  Baked goods made with fat, such as croissants, muffins, or some breads. Dry pasta or rice meal packs. Vegetables  Creamed or fried vegetables. Vegetables in a cheese sauce. Regular canned vegetables (not low-sodium or reduced-sodium). Regular canned tomato sauce and paste (not low-sodium or reduced-sodium). Regular tomato and vegetable juice (not low-sodium or reduced-sodium). Angie Fava. Olives. Fruits  Canned fruit in a light or heavy syrup. Fried fruit. Fruit in cream or butter sauce. Meat and other protein foods  Fatty cuts of meat. Ribs. Fried meat. Berniece Salines. Sausage. Bologna and other processed lunch meats. Salami. Fatback. Hotdogs. Bratwurst. Salted nuts and seeds. Canned beans with added salt. Canned or smoked fish. Whole eggs or egg yolks. Chicken or [place] with skin. Dairy  Whole or 2% milk, cream, and half-and-half. Whole or full-fat cream cheese. Whole-fat or sweetened yogurt. Full-fat cheese. Nondairy creamers. Whipped toppings. Processed cheese and cheese spreads. Fats and oils  Butter. Stick margarine. Lard. Shortening. Ghee. Bacon fat. Tropical oils, such as coconut, palm kernel, or palm oil. Seasoning and other foods  Salted popcorn and  pretzels. Onion salt, garlic salt, seasoned salt, table salt, and sea salt. Worcestershire sauce. Tartar sauce. Barbecue sauce. Teriyaki sauce. Soy sauce, including reduced-sodium. Steak sauce. Canned and packaged gravies. Fish sauce. Oyster sauce. Cocktail sauce. Horseradish that you find on the shelf. Ketchup. Mustard. Meat flavorings and tenderizers. Bouillon cubes. Hot sauce and Tabasco sauce. Premade or packaged marinades. Premade or packaged taco seasonings. Relishes. Regular salad dressings. Where to find more information:  National Heart, Lung, and [place]: https://wilson-eaton.com/  American Heart Association: www.heart.org Summary  The DASH eating plan is a healthy eating plan that has been shown to reduce high blood pressure (hypertension). It may also reduce your risk for type 2 diabetes, heart disease, and stroke.  With the DASH eating plan, you should limit salt (sodium) intake to 2,300 mg a day. If you have hypertension, you may need to reduce your  sodium intake to 1,500 mg a day.  When on the DASH eating plan, aim to eat more fresh fruits and vegetables, whole grains, lean proteins, low-fat dairy, and heart-healthy fats.  Work with your health care provider or diet and nutrition specialist (dietitian) to adjust your eating plan to your individual calorie needs. This information is not intended to replace advice given to you by your health care provider. Make sure you discuss any questions you have with your health care provider. Document Released: 07/07/2011 Document Revised: 07/11/2016 Document Reviewed: 07/11/2016 Elsevier Interactive Patient Education  2017 [place]. Losartan tablets What is this medicine? LOSARTAN (loe SAR tan) is used to treat high blood pressure and to reduce the risk of stroke in certain patients. This drug also slows the progression of kidney disease in patients with diabetes. This medicine may be used for other purposes; ask your health care  provider or pharmacist if you have questions. COMMON BRAND NAME(S): Cozaar What should I tell my health care provider before I take this medicine? They need to know if you have any of these conditions: -heart failure -kidney or liver disease -an unusual or allergic reaction to losartan, other medicines, foods, dyes, or preservatives -pregnant or trying to get pregnant -breast-feeding How should I use this medicine? Take this medicine by mouth with a glass of water. Follow the directions on the prescription label. This medicine can be taken with or without food. Take your doses at regular intervals. Do not take your medicine more often than directed. Talk to your pediatrician regarding the use of this medicine in children. Special care may be needed. Overdosage: If you think you have taken too much of this medicine contact a poison control center or emergency room at once. NOTE: This medicine is only for you. Do not share this medicine with others. What if I miss a dose? If you miss a dose, take it as soon as you can. If it is almost time for your next dose, take only that dose. Do not take double or extra doses. What may interact with this medicine? -blood pressure medicines -diuretics, especially triamterene, spironolactone, or amiloride -fluconazole -NSAIDs, medicines for pain and inflammation, like ibuprofen or naproxen -potassium salts or potassium supplements -rifampin This list may not describe all possible interactions. Give your health care provider a list of all the medicines, herbs, non-prescription drugs, or dietary supplements you use. Also tell them if you smoke, drink alcohol, or use illegal drugs. Some items may interact with your medicine. What should I watch for while using this medicine? Visit your doctor or health care professional for regular checks on your progress. Check your blood pressure as directed. Ask your doctor or health care professional what your blood  pressure should be and when you should contact him or her. Call your doctor or health care professional if you notice an irregular or fast heart beat. Women should inform their doctor if they wish to become pregnant or think they might be pregnant. There is a potential for serious side effects to an unborn child, particularly in the second or third trimester. Talk to your health care professional or pharmacist for more information. You may get drowsy or dizzy. Do not drive, use machinery, or do anything that needs mental alertness until you know how this drug affects you. Do not stand or sit up quickly, especially if you are an older patient. This reduces the risk of dizzy or fainting spells. Alcohol can make you more drowsy and dizzy.  Avoid alcoholic drinks. Avoid salt substitutes unless you are told otherwise by your doctor or health care professional. Do not treat yourself for coughs, colds, or pain while you are taking this medicine without asking your doctor or health care professional for advice. Some ingredients may increase your blood pressure. What side effects may I notice from receiving this medicine? Side effects that you should report to your doctor or health care professional as soon as possible: -confusion, dizziness, light headedness or fainting spells -decreased amount of urine passed -difficulty breathing or swallowing, hoarseness, or tightening of the throat -fast or irregular heart beat, palpitations, or chest pain -skin rash, itching -swelling of your face, lips, tongue, hands, or feet Side effects that usually do not require medical attention (report to your doctor or health care professional if they continue or are bothersome): -cough -decreased sexual function or desire -headache -nasal congestion or stuffiness -nausea or stomach pain -sore or cramping muscles This list may not describe all possible side effects. Call your doctor for medical advice about side effects. You  may report side effects to FDA at 1-800-FDA-1088. Where should I keep my medicine? Keep out of the reach of children. Store at room temperature between 15 and 30 degrees C (59 and 86 degrees F). Protect from light. Keep container tightly closed. Throw away any unused medicine after the expiration date. NOTE: This sheet is a summary. It may not cover all possible information. If you have questions about this medicine, talk to your doctor, pharmacist, or health care provider.  2018 Elsevier/Gold Standard (2007-09-28 16:42:18)

## 2016-10-10 NOTE — Progress Notes (Signed)
Patient: Linda Bradford Female    DOB: [birth year]   61 y.o.   MRN: [id number] Visit Date: 10/10/2016  Today's Provider: Mar Daring, PA-C   Chief Complaint  Patient presents with  . Hypertension   Subjective:    Patient here today C/O elevated blood pressure on Thursday at [place]. Patient reports that her BP was 160/80 and was having a head ache that evening and palpitations. Patient reports that she has been checking her blood pressure this weekend and reports elevated readings.   She checked her BP at home on Saturday while she was having a faint headache and her BP was 201/100. She reports she has been using alka seltzer cold and sinus with pseudoephedrine recently for sinus congestion.    Allergies  Allergen Reactions  . Dexilant [Dexlansoprazole] Nausea Only and Other (See Comments)    Dizziness     Current Outpatient Prescriptions:  .  acetaminophen (TYLENOL) 500 MG tablet, Take 1,000 mg by mouth every 6 (six) hours as needed., Disp: , Rfl:  .  albuterol (PROVENTIL HFA;VENTOLIN HFA) 108 (90 Base) MCG/ACT inhaler, Inhale 1 puff into the lungs every 4 (four) hours as needed., Disp: 1 Inhaler, Rfl: 11 .  aspirin 81 MG tablet, Take 81 mg by mouth., Disp: , Rfl:  .  benzonatate (TESSALON) 200 MG capsule, Take 1 capsule (200 mg total) by mouth 3 (three) times daily as needed for cough., Disp: 90 capsule, Rfl: 3 .  clonazePAM (KLONOPIN) 0.5 MG tablet, Take 0.5 tablets (0.25 mg total) by mouth 2 (two) times daily as needed for anxiety. If 1/2 tablet is not effective can take one whole tablet twice daily., Disp: 60 tablet, Rfl: 5 .  Cyanocobalamin (RA VITAMIN B-12 TR) 1000 MCG TBCR, Take by mouth., Disp: , Rfl:  .  DULoxetine (CYMBALTA) 60 MG capsule, Take 1 capsule (60 mg total) by mouth 2 (two) times daily., Disp: 60 capsule, Rfl: 6 .  estradiol (ESTRACE) 0.5 MG tablet, Take 1 tablet (0.5 mg total) by mouth daily., Disp: 30 tablet, Rfl: 6 .  fexofenadine-pseudoephedrine  (ALLEGRA-D ALLERGY & CONGESTION) 180-240 MG 24 hr tablet, Take 1 tablet by mouth daily., Disp: 30 tablet, Rfl: 3 .  fluticasone (FLONASE) 50 MCG/ACT nasal spray, Place 2 sprays into both nostrils daily., Disp: 16 g, Rfl: 11 .  gabapentin (NEURONTIN) 300 MG capsule, Take 4 capsules (1,200 mg total) by mouth at bedtime., Disp: 120 capsule, Rfl: 6 .  ipratropium-albuterol (DUONEB) 0.5-2.5 (3) MG/3ML SOLN, Take 3 mLs by nebulization every 6 (six) hours as needed., Disp: 360 mL, Rfl: 11 .  levothyroxine (SYNTHROID, LEVOTHROID) 75 MCG tablet, Take 1 tablet (75 mcg total) by mouth daily., Disp: 90 tablet, Rfl: 1 .  medroxyPROGESTERone (PROVERA) 2.5 MG tablet, Take 1 tablet (2.5 mg total) by mouth daily., Disp: 30 tablet, Rfl: 6 .  theophylline (THEO-24) 300 MG 24 hr capsule, Take 1 capsule (300 mg total) by mouth daily., Disp: 30 capsule, Rfl: 5 .  tiotropium (SPIRIVA) 18 MCG inhalation capsule, Place 1 capsule (18 mcg total) into inhaler and inhale daily., Disp: 30 capsule, Rfl: 11 .  tiZANidine (ZANAFLEX) 4 MG capsule, Take 1 capsule (4 mg total) by mouth 3 (three) times daily., Disp: 90 capsule, Rfl: 6 .  Vitamin D, Ergocalciferol, (DRISDOL) 50000 units CAPS capsule, Take 1 capsule (50,000 Units total) by mouth every 7 (seven) days., Disp: 12 capsule, Rfl: 0  Review of Systems  Constitutional: Negative.   Respiratory:  Negative.   Cardiovascular: Positive for palpitations. Negative for chest pain and leg swelling.  Gastrointestinal: Negative.   Neurological: Positive for headaches. Negative for dizziness and light-headedness.  Psychiatric/Behavioral: Positive for dysphoric mood.    Social History  Substance Use Topics  . Smoking status: Current Every Day Smoker    Packs/day: 1.00    Years: 45.00    Types: Cigarettes    Start date: 03/02/1980  . Smokeless tobacco: Never Used     Comment: every other day  . Alcohol use No   Objective:   BP (!) 142/72 (BP Location: Left Arm, Patient Position:  Sitting, Cuff Size: Large)   Pulse 96   Temp 98.3 F (36.8 C) (Oral)   Resp 20   Ht 5\' 11"  (1.803 m)   Wt 236 lb 12.8 oz (107.4 kg)   SpO2 95%   BMI 33.03 kg/m  Vitals:   10/10/16 0857  BP: (!) 142/72  Pulse: 96  Resp: 20  Temp: 98.3 F (36.8 C)  TempSrc: Oral  SpO2: 95%  Weight: 236 lb 12.8 oz (107.4 kg)  Height: 5\' 11"  (1.803 m)    Depression screen [facility] 2/9 10/10/2016  Decreased Interest 2  Down, Depressed, Hopeless 2  PHQ - 2 Score 4  Altered sleeping 2  Tired, decreased energy 2  Change in appetite 2  Feeling bad or failure about yourself  2  Trouble concentrating 0  Moving slowly or fidgety/restless 0  Suicidal thoughts 0  PHQ-9 Score 12   GAD 7 : Generalized Anxiety Score 10/10/2016  Nervous, Anxious, on Edge 2  Control/stop worrying 0  Worry too much - different things 0  Trouble relaxing 2  Restless 0  Easily annoyed or irritable 2  Afraid - awful might happen 1  Total GAD 7 Score 7  Anxiety Difficulty Not difficult at all      Physical Exam  Constitutional: She appears well-developed and well-nourished. No distress.  Neck: Normal range of motion. Neck supple. No JVD present. No tracheal deviation present. No thyromegaly present.  Cardiovascular: Normal rate, regular rhythm and normal heart sounds.  Exam reveals no gallop and no friction rub.   No murmur heard. Pulmonary/Chest: Effort normal and breath sounds normal. No respiratory distress. She has no wheezes. She has no rales.  Musculoskeletal: She exhibits no edema.  Lymphadenopathy:    She has no cervical adenopathy.  Skin: She is not diaphoretic.  Vitals reviewed.     Assessment & Plan:     1. Essential hypertension Slightly elevated today in office. Discussed use of pseudoephedrine and elevated BP. Advised to use coricidin HBP or Mucinex for congestion instead. Saline nasal washes and flonase can also help. Will start losartan as below. She is to check BP at home. I will see her back in 4  weeks to recheck BP and make sure she is tolerating well.  - losartan (COZAAR) 50 MG tablet; Take 1 tablet (50 mg total) by mouth daily.  Dispense: 90 tablet; Refill: 0  2. Environmental allergies Stable. Diagnosis pulled for medication refill. Continue current medical treatment plan. - fluticasone (FLONASE) 50 MCG/ACT nasal spray; Place 2 sprays into both nostrils daily.  Dispense: 48 g; Refill: 1 - fexofenadine (ALLEGRA) 180 MG tablet; Take 1 tablet (180 mg total) by mouth daily.  Dispense: 90 tablet; Refill: [place], PA-C  [facility]

## 2016-10-22 DIAGNOSIS — R05 Cough: Secondary | ICD-10-CM | POA: Diagnosis not present

## 2016-10-22 DIAGNOSIS — J441 Chronic obstructive pulmonary disease with (acute) exacerbation: Secondary | ICD-10-CM | POA: Diagnosis not present

## 2016-10-26 ENCOUNTER — Ambulatory Visit: Payer: BLUE CROSS/BLUE SHIELD | Admitting: Physician Assistant

## 2016-10-26 NOTE — Progress Notes (Deleted)
Patient: Linda Bradford Female    DOB: [birth year]   61 y.o.   MRN: [id number] Visit Date: 10/26/2016  Today's Provider: Mar Daring, PA-C   No chief complaint on file.  Subjective:    HPI Patient is here for 3 month follow-up depression. On 12/28 Cymbalta was increased to 60mg  BID. She was having side effect from the Seroquel.She was advised to slowly taper off the Seroquel.  She complains of {depression symptoms:1002}.  She denies current suicidal and homicidal plan or intent.  Family history significant for {fam hx:15335}. Risk factors: {depression risk factors:1001} Previous treatment includes {anxiety treatments:15336} and {depression treatment:1010}. She complains of the following side effects from the treatment: {side effects:15372}.        Allergies  Allergen Reactions  . Dexilant [Dexlansoprazole] Nausea Only and Other (See Comments)    Dizziness     Current Outpatient Prescriptions:  .  acetaminophen (TYLENOL) 500 MG tablet, Take 1,000 mg by mouth every 6 (six) hours as needed., Disp: , Rfl:  .  albuterol (PROVENTIL HFA;VENTOLIN HFA) 108 (90 Base) MCG/ACT inhaler, Inhale 1 puff into the lungs every 4 (four) hours as needed., Disp: 1 Inhaler, Rfl: 11 .  aspirin 81 MG tablet, Take 81 mg by mouth., Disp: , Rfl:  .  benzonatate (TESSALON) 200 MG capsule, Take 1 capsule (200 mg total) by mouth 3 (three) times daily as needed for cough., Disp: 90 capsule, Rfl: 3 .  clonazePAM (KLONOPIN) 0.5 MG tablet, Take 0.5 tablets (0.25 mg total) by mouth 2 (two) times daily as needed for anxiety. If 1/2 tablet is not effective can take one whole tablet twice daily., Disp: 60 tablet, Rfl: 5 .  Cyanocobalamin (RA VITAMIN B-12 TR) 1000 MCG TBCR, Take by mouth., Disp: , Rfl:  .  DULoxetine (CYMBALTA) 60 MG capsule, Take 1 capsule (60 mg total) by mouth 2 (two) times daily., Disp: 60 capsule, Rfl: 6 .  estradiol (ESTRACE) 0.5 MG tablet, Take 1 tablet (0.5 mg total) by mouth daily.,  Disp: 30 tablet, Rfl: 6 .  fexofenadine (ALLEGRA) 180 MG tablet, Take 1 tablet (180 mg total) by mouth daily., Disp: 90 tablet, Rfl: 1 .  fluticasone (FLONASE) 50 MCG/ACT nasal spray, Place 2 sprays into both nostrils daily., Disp: 48 g, Rfl: 1 .  gabapentin (NEURONTIN) 300 MG capsule, Take 4 capsules (1,200 mg total) by mouth at bedtime., Disp: 120 capsule, Rfl: 6 .  ipratropium-albuterol (DUONEB) 0.5-2.5 (3) MG/3ML SOLN, Take 3 mLs by nebulization every 6 (six) hours as needed., Disp: 360 mL, Rfl: 11 .  levothyroxine (SYNTHROID, LEVOTHROID) 75 MCG tablet, Take 1 tablet (75 mcg total) by mouth daily., Disp: 90 tablet, Rfl: 1 .  losartan (COZAAR) 50 MG tablet, Take 1 tablet (50 mg total) by mouth daily., Disp: 90 tablet, Rfl: 0 .  medroxyPROGESTERone (PROVERA) 2.5 MG tablet, Take 1 tablet (2.5 mg total) by mouth daily., Disp: 30 tablet, Rfl: 6 .  theophylline (THEO-24) 300 MG 24 hr capsule, Take 1 capsule (300 mg total) by mouth daily., Disp: 30 capsule, Rfl: 5 .  tiotropium (SPIRIVA) 18 MCG inhalation capsule, Place 1 capsule (18 mcg total) into inhaler and inhale daily., Disp: 30 capsule, Rfl: 11 .  tiZANidine (ZANAFLEX) 4 MG capsule, Take 1 capsule (4 mg total) by mouth 3 (three) times daily., Disp: 90 capsule, Rfl: 6 .  Vitamin D, Ergocalciferol, (DRISDOL) 50000 units CAPS capsule, Take 1 capsule (50,000 Units total) by mouth every 7 (seven) days., Disp:  12 capsule, Rfl: 0  Review of Systems  Social History  Substance Use Topics  . Smoking status: Current Every Day Smoker    Packs/day: 1.00    Years: 45.00    Types: Cigarettes    Start date: 03/02/1980  . Smokeless tobacco: Never Used     Comment: every other day  . Alcohol use No   Objective:   There were no vitals taken for this visit.   Physical Exam      Assessment & Plan:           Mar Daring, PA-C  [facility]

## 2016-11-04 DIAGNOSIS — L821 Other seborrheic keratosis: Secondary | ICD-10-CM | POA: Diagnosis not present

## 2016-11-07 ENCOUNTER — Ambulatory Visit (INDEPENDENT_AMBULATORY_CARE_PROVIDER_SITE_OTHER): Payer: BLUE CROSS/BLUE SHIELD | Admitting: Physician Assistant

## 2016-11-07 ENCOUNTER — Encounter: Payer: Self-pay | Admitting: Physician Assistant

## 2016-11-07 VITALS — BP 120/60 | HR 88 | Temp 97.5°F | Resp 16 | Wt 239.0 lb

## 2016-11-07 DIAGNOSIS — J441 Chronic obstructive pulmonary disease with (acute) exacerbation: Secondary | ICD-10-CM

## 2016-11-07 DIAGNOSIS — Z8249 Family history of ischemic heart disease and other diseases of the circulatory system: Secondary | ICD-10-CM | POA: Diagnosis not present

## 2016-11-07 DIAGNOSIS — I10 Essential (primary) hypertension: Secondary | ICD-10-CM

## 2016-11-07 DIAGNOSIS — E559 Vitamin D deficiency, unspecified: Secondary | ICD-10-CM

## 2016-11-07 DIAGNOSIS — R002 Palpitations: Secondary | ICD-10-CM

## 2016-11-07 MED ORDER — HYDROCOD POLST-CPM POLST ER 10-8 MG/5ML PO SUER: 5.0000 mL | Freq: Every evening | ORAL | 0 refills | Status: DC | PRN

## 2016-11-07 MED ORDER — LEVOFLOXACIN 500 MG PO TABS: 500.0000 mg | ORAL_TABLET | Freq: Every day | ORAL | 0 refills | Status: DC

## 2016-11-07 MED ORDER — VITAMIN D (ERGOCALCIFEROL) 1.25 MG (50000 UNIT) PO CAPS: 50000.0000 [IU] | ORAL_CAPSULE | ORAL | 3 refills | Status: DC

## 2016-11-07 MED ORDER — PREDNISONE 10 MG (21) PO TBPK: ORAL_TABLET | ORAL | 1 refills | Status: DC

## 2016-11-07 MED ORDER — LOSARTAN POTASSIUM 50 MG PO TABS: 50.0000 mg | ORAL_TABLET | Freq: Every day | ORAL | 1 refills | Status: DC

## 2016-11-07 NOTE — Progress Notes (Signed)
Patient: Linda Bradford Female    DOB: 02-08-1956   61 y.o.   MRN: [id number] Visit Date: 11/07/2016  Today's Provider: Mar Daring, PA-C   Chief Complaint  Patient presents with  . Follow-up    Elevated BP   Subjective:    HPI Linda Bradford is a 61 y/o female following up on Elevated Blood Pressure. Patient was seen 4 weeks ago and was started on Losartan 50 mg.   Hypertension, follow-up:  BP Readings from Last 3 Encounters:  11/07/16 120/60  10/10/16 (!) 142/72  07/28/16 (!) 160/90    She was last seen for hypertension 4 weeks ago.  BP at that visit was 142/72. Management since that visit includes Losartan 50 mg. She reports excellent compliance with treatment. She is not having side effects.  She is exercising. She is adherent to low salt diet.   Outside blood pressures are 120's/80's. She is experiencing Fluttering in chest 4-5 a week and it has been going on for while.. Since around the holidays. Patient denies chest pain, chest pressure/discomfort, exertional chest pressure/discomfort, fatigue, irregular heart beat, lower extremity edema, near-syncope and palpitations.   Cardiovascular risk factors include hypertension, obesity (BMI >= 30 kg/m2) and smoking/ tobacco exposure.   Weight trend: stable Wt Readings from Last 3 Encounters:  11/07/16 239 lb (108.4 kg)  10/10/16 236 lb 12.8 oz (107.4 kg)  07/28/16 239 lb (108.4 kg)    Current diet: in general, an "unhealthy" diet  ------------------------------------------------------------------------ She also reports that she went to the [facility] March 1,2018 for chest congestion and was treated with zpak and prednisone and feels that her symptoms are the same. She is currently using the nebulizer BID. She uses Spiriva daily and her rescue inhaler since she had this symptoms and Tessalon. She is also using her daily medication for allergies.     Allergies  Allergen Reactions  . Dexilant [Dexlansoprazole]  Nausea Only and Other (See Comments)    Dizziness     Current Outpatient Prescriptions:  .  acetaminophen (TYLENOL) 500 MG tablet, Take 1,000 mg by mouth every 6 (six) hours as needed., Disp: , Rfl:  .  albuterol (PROVENTIL HFA;VENTOLIN HFA) 108 (90 Base) MCG/ACT inhaler, Inhale 1 puff into the lungs every 4 (four) hours as needed., Disp: 1 Inhaler, Rfl: 11 .  aspirin 81 MG tablet, Take 81 mg by mouth., Disp: , Rfl:  .  benzonatate (TESSALON) 200 MG capsule, Take 1 capsule (200 mg total) by mouth 3 (three) times daily as needed for cough., Disp: 90 capsule, Rfl: 3 .  clonazePAM (KLONOPIN) 0.5 MG tablet, Take 0.5 tablets (0.25 mg total) by mouth 2 (two) times daily as needed for anxiety. If 1/2 tablet is not effective can take one whole tablet twice daily., Disp: 60 tablet, Rfl: 5 .  Cyanocobalamin (RA VITAMIN B-12 TR) 1000 MCG TBCR, Take by mouth., Disp: , Rfl:  .  DULoxetine (CYMBALTA) 60 MG capsule, Take 1 capsule (60 mg total) by mouth 2 (two) times daily., Disp: 60 capsule, Rfl: 6 .  estradiol (ESTRACE) 0.5 MG tablet, Take 1 tablet (0.5 mg total) by mouth daily., Disp: 30 tablet, Rfl: 6 .  fexofenadine (ALLEGRA) 180 MG tablet, Take 1 tablet (180 mg total) by mouth daily., Disp: 90 tablet, Rfl: 1 .  fluticasone (FLONASE) 50 MCG/ACT nasal spray, Place 2 sprays into both nostrils daily., Disp: 48 g, Rfl: 1 .  gabapentin (NEURONTIN) 300 MG capsule, Take 4 capsules (1,200 mg total)  by mouth at bedtime., Disp: 120 capsule, Rfl: 6 .  ipratropium-albuterol (DUONEB) 0.5-2.5 (3) MG/3ML SOLN, Take 3 mLs by nebulization every 6 (six) hours as needed., Disp: 360 mL, Rfl: 11 .  levothyroxine (SYNTHROID, LEVOTHROID) 75 MCG tablet, Take 1 tablet (75 mcg total) by mouth daily., Disp: 90 tablet, Rfl: 1 .  losartan (COZAAR) 50 MG tablet, Take 1 tablet (50 mg total) by mouth daily., Disp: 90 tablet, Rfl: 0 .  medroxyPROGESTERone (PROVERA) 2.5 MG tablet, Take 1 tablet (2.5 mg total) by mouth daily., Disp: 30  tablet, Rfl: 6 .  theophylline (THEO-24) 300 MG 24 hr capsule, Take 1 capsule (300 mg total) by mouth daily., Disp: 30 capsule, Rfl: 5 .  tiotropium (SPIRIVA) 18 MCG inhalation capsule, Place 1 capsule (18 mcg total) into inhaler and inhale daily., Disp: 30 capsule, Rfl: 11 .  tiZANidine (ZANAFLEX) 4 MG capsule, Take 1 capsule (4 mg total) by mouth 3 (three) times daily., Disp: 90 capsule, Rfl: 6 .  Vitamin D, Ergocalciferol, (DRISDOL) 50000 units CAPS capsule, Take 1 capsule (50,000 Units total) by mouth every 7 (seven) days. (Patient not taking: Reported on 11/07/2016), Disp: 12 capsule, Rfl: 0  Review of Systems  Constitutional: Negative.  Negative for fever.  HENT: Positive for congestion, postnasal drip, rhinorrhea, sinus pressure and sneezing. Negative for ear pain, sinus pain, sore throat and trouble swallowing.   Respiratory: Positive for cough, chest tightness, shortness of breath (a little) and wheezing.   Cardiovascular: Negative for chest pain, palpitations and leg swelling.       Palpitations  Gastrointestinal: Negative for abdominal pain and nausea.  Neurological: Negative for dizziness, syncope, light-headedness, numbness and headaches.    Social History  Substance Use Topics  . Smoking status: Current Every Day Smoker    Packs/day: 1.00    Years: 45.00    Types: Cigarettes    Start date: 03/02/1980  . Smokeless tobacco: Never Used     Comment: every other day  . Alcohol use No   Objective:   BP 120/60 (BP Location: Right Arm, Patient Position: Sitting, Cuff Size: Large)   Pulse 88   Temp 97.5 F (36.4 C) (Oral)   Resp 16   Wt 239 lb (108.4 kg)   SpO2 96%   BMI 33.33 kg/m    Physical Exam  Constitutional: She appears well-developed and well-nourished. No distress.  HENT:  Head: Normocephalic and atraumatic.  Right Ear: Hearing, tympanic membrane, external ear and ear canal normal.  Left Ear: Hearing, tympanic membrane, external ear and ear canal normal.  Nose:  Nose normal.  Mouth/Throat: Uvula is midline, oropharynx is clear and moist and mucous membranes are normal. No oropharyngeal exudate.  Eyes: Conjunctivae are normal. Pupils are equal, round, and reactive to light. Right eye exhibits no discharge. Left eye exhibits no discharge. No scleral icterus.  Neck: Normal range of motion. Neck supple. No tracheal deviation present. No thyromegaly present.  Cardiovascular: Normal rate, regular rhythm and normal heart sounds.  Exam reveals no gallop and no friction rub.   No murmur heard. Pulmonary/Chest: Effort normal. No stridor. No respiratory distress. She has no decreased breath sounds. She has no wheezes. She has rhonchi (expiratory throughout but worse on R vs L). She has no rales.  Lymphadenopathy:    She has no cervical adenopathy.  Skin: Skin is warm and dry. She is not diaphoretic.  Vitals reviewed.      Assessment & Plan:     1. Heart palpitations EKG shows  NSR rate of 82, no ST elevations personally reviewed by me. Family history of early CAD in her father and brother, both passing from MIs. Patient now having palpitations at rest. Denies any increasing SOB with exertion (patient has COPD at baseline), chest pain, dizziness, light-headedness, syncope, or lower extremity edema. Feel symptoms are most likely anxiety driven as patient does have high anxiety and is on treatment. Symptoms also began around the time she was under more stress caused by her son's around the holidays. Due to family history, current smoker, COPD, and recent hypertension will refer to cardiology for further evaluation. - EKG 12-Lead - Ambulatory referral to Cardiology  2. Family history of early CAD See above medical treatment plan. - EKG 12-Lead - Ambulatory referral to Cardiology  3. Chronic obstructive pulmonary disease with acute exacerbation (HCC) Worsening symptoms that have not responded to OTC medications. Will give Levaquin as below. Continue allergy  medications and COPD medications. Stay well hydrated and get plenty of rest. Call if no symptom improvement or if symptoms worsen. - predniSONE (STERAPRED UNI-PAK 21 TAB) 10 MG (21) TBPK tablet; Take as directed on package instructions  Dispense: 21 tablet; Refill: 1 - levofloxacin (LEVAQUIN) 500 MG tablet; Take 1 tablet (500 mg total) by mouth daily.  Dispense: 10 tablet; Refill: 0 - chlorpheniramine-HYDROcodone (TUSSIONEX PENNKINETIC ER) 10-8 MG/5ML SUER; Take 5 mLs by mouth at bedtime as needed for cough.  Dispense: 140 mL; Refill: 0       Mar Daring, PA-C  [facility]

## 2016-11-07 NOTE — Patient Instructions (Signed)
Palpitations A palpitation is the feeling that your heart:  Has an uneven (irregular) heartbeat.  Is beating faster than normal.  Is fluttering.  Is skipping a beat. This is usually not a serious problem. In some cases, you may need more medical tests. Follow these instructions at home:  Avoid:  Caffeine in coffee, tea, soft drinks, diet pills, and energy drinks.  Chocolate.  Alcohol.  Do not use any tobacco products. These include cigarettes, chewing tobacco, and e-cigarettes. If you need help quitting, ask your doctor.  Try to reduce your stress. These things may help:  Yoga.  Meditation.  Physical activity. Swimming, jogging, and walking are good choices.  A method that helps you use your mind to control things in your body, like heartbeats (biofeedback).  Get plenty of rest and sleep.  Take over-the-counter and prescription medicines only as told by your doctor.  Keep all follow-up visits as told by your doctor. This is important. Contact a doctor if:  Your heartbeat is still fast or uneven after 24 hours.  Your palpitations occur more often. Get help right away if:  You have chest pain.  You feel short of breath.  You have a very bad headache.  You feel dizzy.  You pass out (faint). This information is not intended to replace advice given to you by your health care provider. Make sure you discuss any questions you have with your health care provider. Document Released: 04/26/2008 Document Revised: 12/24/2015 Document Reviewed: 04/02/2015 Elsevier Interactive Patient Education  2017 [employer].

## 2016-11-23 ENCOUNTER — Encounter: Payer: Self-pay | Admitting: Physician Assistant

## 2016-11-23 ENCOUNTER — Ambulatory Visit (INDEPENDENT_AMBULATORY_CARE_PROVIDER_SITE_OTHER): Payer: BLUE CROSS/BLUE SHIELD | Admitting: Physician Assistant

## 2016-11-23 DIAGNOSIS — J441 Chronic obstructive pulmonary disease with (acute) exacerbation: Secondary | ICD-10-CM

## 2016-11-23 MED ORDER — PREDNISONE 10 MG (21) PO TBPK: ORAL_TABLET | ORAL | 1 refills | Status: DC

## 2016-11-23 MED ORDER — LEVOFLOXACIN 500 MG PO TABS: 500.0000 mg | ORAL_TABLET | Freq: Every day | ORAL | 0 refills | Status: DC

## 2016-11-23 MED ORDER — HYDROCOD POLST-CPM POLST ER 10-8 MG/5ML PO SUER: 5.0000 mL | Freq: Every evening | ORAL | 0 refills | Status: DC | PRN

## 2016-11-23 NOTE — Progress Notes (Signed)
Patient: Linda Bradford Female    DOB: 18-Feb-1956   61 y.o.   MRN: [id number] Visit Date: 11/23/2016  Today's Provider: Mar Daring, PA-C   Chief Complaint  Patient presents with  . Cough   Subjective:    HPI Patient was seen on 11/07/2016 in the office with similar symptoms. Patient was treated with a prednisone taper, Levaquin 500mg  X 10 days, and prescription cough syrup. She reports that she was seen at the urgent care 1 month prior and was also treated.  Patient reports that she had 1 refill on the prednisone and she has about 2 doses left. She is taking her maintaince COPD meds (sprivia and nebulizer) daily. She has also had to use her rescue inhaler since she has been sick. She has previously used Advair with spiriva but discontinued because she states she developed "jitteriness". She is not currently on any LABA inhalers or ICS.     Allergies  Allergen Reactions  . Dexilant [Dexlansoprazole] Nausea Only and Other (See Comments)    Dizziness     Current Outpatient Prescriptions:  .  acetaminophen (TYLENOL) 500 MG tablet, Take 1,000 mg by mouth every 6 (six) hours as needed., Disp: , Rfl:  .  albuterol (PROVENTIL HFA;VENTOLIN HFA) 108 (90 Base) MCG/ACT inhaler, Inhale 1 puff into the lungs every 4 (four) hours as needed., Disp: 1 Inhaler, Rfl: 11 .  aspirin 81 MG tablet, Take 81 mg by mouth., Disp: , Rfl:  .  benzonatate (TESSALON) 200 MG capsule, Take 1 capsule (200 mg total) by mouth 3 (three) times daily as needed for cough., Disp: 90 capsule, Rfl: 3 .  chlorpheniramine-HYDROcodone (TUSSIONEX PENNKINETIC ER) 10-8 MG/5ML SUER, Take 5 mLs by mouth at bedtime as needed for cough., Disp: 140 mL, Rfl: 0 .  clonazePAM (KLONOPIN) 0.5 MG tablet, Take 0.5 tablets (0.25 mg total) by mouth 2 (two) times daily as needed for anxiety. If 1/2 tablet is not effective can take one whole tablet twice daily., Disp: 60 tablet, Rfl: 5 .  Cyanocobalamin (RA VITAMIN B-12 TR) 1000 MCG  TBCR, Take by mouth., Disp: , Rfl:  .  DULoxetine (CYMBALTA) 60 MG capsule, Take 1 capsule (60 mg total) by mouth 2 (two) times daily., Disp: 60 capsule, Rfl: 6 .  estradiol (ESTRACE) 0.5 MG tablet, Take 1 tablet (0.5 mg total) by mouth daily., Disp: 30 tablet, Rfl: 6 .  fexofenadine (ALLEGRA) 180 MG tablet, Take 1 tablet (180 mg total) by mouth daily., Disp: 90 tablet, Rfl: 1 .  fluticasone (FLONASE) 50 MCG/ACT nasal spray, Place 2 sprays into both nostrils daily., Disp: 48 g, Rfl: 1 .  gabapentin (NEURONTIN) 300 MG capsule, Take 4 capsules (1,200 mg total) by mouth at bedtime., Disp: 120 capsule, Rfl: 6 .  ipratropium-albuterol (DUONEB) 0.5-2.5 (3) MG/3ML SOLN, Take 3 mLs by nebulization every 6 (six) hours as needed., Disp: 360 mL, Rfl: 11 .  levothyroxine (SYNTHROID, LEVOTHROID) 75 MCG tablet, Take 1 tablet (75 mcg total) by mouth daily., Disp: 90 tablet, Rfl: 1 .  losartan (COZAAR) 50 MG tablet, Take 1 tablet (50 mg total) by mouth daily., Disp: 90 tablet, Rfl: 1 .  medroxyPROGESTERone (PROVERA) 2.5 MG tablet, Take 1 tablet (2.5 mg total) by mouth daily., Disp: 30 tablet, Rfl: 6 .  predniSONE (STERAPRED UNI-PAK 21 TAB) 10 MG (21) TBPK tablet, Take as directed on package instructions, Disp: 21 tablet, Rfl: 1 .  theophylline (THEO-24) 300 MG 24 hr capsule, Take 1 capsule (  300 mg total) by mouth daily., Disp: 30 capsule, Rfl: 5 .  tiotropium (SPIRIVA) 18 MCG inhalation capsule, Place 1 capsule (18 mcg total) into inhaler and inhale daily., Disp: 30 capsule, Rfl: 11 .  tiZANidine (ZANAFLEX) 4 MG capsule, Take 1 capsule (4 mg total) by mouth 3 (three) times daily., Disp: 90 capsule, Rfl: 6 .  Vitamin D, Ergocalciferol, (DRISDOL) 50000 units CAPS capsule, Take 1 capsule (50,000 Units total) by mouth every 7 (seven) days., Disp: 12 capsule, Rfl: 3 .  levofloxacin (LEVAQUIN) 500 MG tablet, Take 1 tablet (500 mg total) by mouth daily. (Patient not taking: Reported on 11/23/2016), Disp: 10 tablet, Rfl:  0  Review of Systems  Constitutional: Positive for fatigue.  HENT: Positive for congestion, postnasal drip, rhinorrhea, sinus pain, sinus pressure and sneezing. Negative for ear pain and trouble swallowing.   Respiratory: Positive for cough and shortness of breath.   Cardiovascular: Negative.   Gastrointestinal: Negative.   Neurological: Positive for headaches. Negative for dizziness.    Social History  Substance Use Topics  . Smoking status: Current Every Day Smoker    Packs/day: 1.00    Years: 45.00    Types: Cigarettes    Start date: 03/02/1980  . Smokeless tobacco: Never Used     Comment: every other day  . Alcohol use No   Objective:   BP (!) 142/84 (BP Location: Left Arm, Patient Position: Sitting, Cuff Size: Large)   Pulse 96   Temp 98.2 F (36.8 C)   Resp 20   Wt 239 lb (108.4 kg)   SpO2 95%   BMI 33.33 kg/m  Vitals:   11/23/16 1331  BP: (!) 142/84  Pulse: 96  Resp: 20  Temp: 98.2 F (36.8 C)  SpO2: 95%  Weight: 239 lb (108.4 kg)     Physical Exam  Constitutional: She appears well-developed and well-nourished. No distress.  HENT:  Head: Normocephalic and atraumatic.  Right Ear: Hearing, tympanic membrane, external ear and ear canal normal.  Left Ear: Hearing, tympanic membrane, external ear and ear canal normal.  Nose: Nose normal.  Mouth/Throat: Uvula is midline, oropharynx is clear and moist and mucous membranes are normal. No oropharyngeal exudate.  Eyes: Conjunctivae are normal. Pupils are equal, round, and reactive to light. Right eye exhibits no discharge. Left eye exhibits no discharge. No scleral icterus.  Neck: Normal range of motion. Neck supple. No tracheal deviation present. No thyromegaly present.  Cardiovascular: Normal rate, regular rhythm and normal heart sounds.  Exam reveals no gallop and no friction rub.   No murmur heard. Pulmonary/Chest: Effort normal. No stridor. No respiratory distress (inspiratory and expiratory wheeze scattered  throughout). She has decreased breath sounds in the right lower field and the left lower field. She has wheezes. She has no rhonchi. She has no rales.  Lymphadenopathy:    She has no cervical adenopathy.  Skin: Skin is warm and dry. She is not diaphoretic.  Vitals reviewed.     Assessment & Plan:     1. Chronic obstructive pulmonary disease with acute exacerbation (HCC) Worsening. Prednisone and levaquin re-dosed for exacerbation. Tussionex refilled as well for nighttime cough. Breo 200-25 inhaler given to patient x 1 to see if she tolerates. She will call if she tolerates and I will send in Rx. She is to call if symptoms worsen.  - predniSONE (STERAPRED UNI-PAK 21 TAB) 10 MG (21) TBPK tablet; Take as directed on package instructions  Dispense: 21 tablet; Refill: 1 - chlorpheniramine-HYDROcodone (TUSSIONEX PENNKINETIC  ER) 10-8 MG/5ML SUER; Take 5 mLs by mouth at bedtime as needed for cough.  Dispense: 240 mL; Refill: 0 - levofloxacin (LEVAQUIN) 500 MG tablet; Take 1 tablet (500 mg total) by mouth daily.  Dispense: 10 tablet; Refill: 0       Mar Daring, PA-C  [facility]

## 2016-11-23 NOTE — Patient Instructions (Signed)
Chronic Obstructive Pulmonary Disease Exacerbation  Chronic obstructive pulmonary disease (COPD) is a common lung problem. In COPD, the flow of air from the lungs is limited. COPD exacerbations are times that breathing gets worse and you need extra treatment. Without treatment they can be life threatening. If they happen often, your lungs can become more damaged. If your COPD gets worse, your doctor may treat you with:  ? Medicines.  ? Oxygen.  ? Different ways to clear your airway, such as using a mask.    Follow these instructions at home:  ? Do not smoke.  ? Avoid tobacco smoke and other things that bother your lungs.  ? If given, take your antibiotic medicine as told. Finish the medicine even if you start to feel better.  ? Only take medicines as told by your doctor.  ? Drink enough fluids to keep your pee (urine) clear or pale yellow (unless your doctor has told you not to).  ? Use a cool mist machine (vaporizer).  ? If you use oxygen or a machine that turns liquid medicine into a mist (nebulizer), continue to use them as told.  ? Keep up with shots (vaccinations) as told by your doctor.  ? Exercise regularly.  ? Eat healthy foods.  ? Keep all doctor visits as told.  Get help right away if:  ? You are very short of breath and it gets worse.  ? You have trouble talking.  ? You have bad chest pain.  ? You have blood in your spit (sputum).  ? You have a fever.  ? You keep throwing up (vomiting).  ? You feel weak, or you pass out (faint).  ? You feel confused.  ? You keep getting worse.  This information is not intended to replace advice given to you by your health care provider. Make sure you discuss any questions you have with your health care provider.  Document Released: 07/07/2011 Document Revised: 12/24/2015 Document Reviewed: 03/22/2013  Elsevier Interactive Patient Education ? 2017 [employer]

## 2016-11-24 ENCOUNTER — Encounter: Payer: Self-pay | Admitting: Cardiology

## 2016-11-24 ENCOUNTER — Ambulatory Visit (INDEPENDENT_AMBULATORY_CARE_PROVIDER_SITE_OTHER): Payer: BLUE CROSS/BLUE SHIELD | Admitting: Cardiology

## 2016-11-24 VITALS — BP 122/64 | HR 84 | Ht 71.0 in | Wt 239.0 lb

## 2016-11-24 DIAGNOSIS — I10 Essential (primary) hypertension: Secondary | ICD-10-CM

## 2016-11-24 DIAGNOSIS — E6609 Other obesity due to excess calories: Secondary | ICD-10-CM

## 2016-11-24 DIAGNOSIS — Z6833 Body mass index (BMI) 33.0-33.9, adult: Secondary | ICD-10-CM

## 2016-11-24 DIAGNOSIS — R0602 Shortness of breath: Secondary | ICD-10-CM | POA: Diagnosis not present

## 2016-11-24 DIAGNOSIS — R002 Palpitations: Secondary | ICD-10-CM

## 2016-11-24 NOTE — Progress Notes (Signed)
Cardiology Office Note   Date:  11/24/2016   ID:  Linda Bradford, DOB January 09, 1956, MRN [id number]  Referring Doctor:  Mar Daring, PA-C   Cardiologist:   Wende Bushy, MD   Reason for consultation:  Chief Complaint  Patient presents with  . other    Ref by Dr. Marlyn Corporal for palpitations, family Hx. of CAD. Pt. c/o fluttering in chest, shortness of breath and palpitations.       History of Present Illness: Linda Bradford is a 61 y.o. female who is being seen today for the evaluation of palpitations, SOB at the request of Mar Daring, P*.  Palpitations - several months, daily recently, on and off, fluttering sensation in chest, non radiating, moderate intensity for a few minutes, spontaneously goes away  SOB - chronic, she thinks is from COPD, worse recently, not always responsive to inhalers, moderate intensity, exertional in nature, resolves with rest  No CP to speak of, no PND, orthopnea, edema, syncope   ROS:  Please see the history of present illness. Aside from mentioned under HPI, all other systems are reviewed and negative.     Past Medical History:  Diagnosis Date  . Anxiety   . Asthma   . Depression   . Fatigue   . Headache   . Hypertension   . Thyroid disease     Past Surgical History:  Procedure Laterality Date  . APPENDECTOMY    . TUBAL LIGATION       reports that she has been smoking Cigarettes.  She started smoking about 36 years ago. She has a 45.00 pack-year smoking history. She has never used smokeless tobacco. She reports that she does not drink alcohol or use drugs.   family history includes Alcohol abuse in her brother, maternal grandmother, maternal uncle, sister, and son; Arthritis in her sister; Breast cancer in her brother; Diabetes in her sister; Drug abuse in her son and son; Heart attack in her brother; Heart disease (age of onset: 28) in her father; Heart disease (age of onset: 78) in her brother; Lung cancer in her mother;  Pancreatic cancer in her maternal aunt.   Outpatient Medications Prior to Visit  Medication Sig Dispense Refill  . acetaminophen (TYLENOL) 500 MG tablet Take 1,000 mg by mouth every 6 (six) hours as needed.    Marland Kitchen albuterol (PROVENTIL HFA;VENTOLIN HFA) 108 (90 Base) MCG/ACT inhaler Inhale 1 puff into the lungs every 4 (four) hours as needed. 1 Inhaler 11  . aspirin 81 MG tablet Take 81 mg by mouth.    . benzonatate (TESSALON) 200 MG capsule Take 1 capsule (200 mg total) by mouth 3 (three) times daily as needed for cough. 90 capsule 3  . chlorpheniramine-HYDROcodone (TUSSIONEX PENNKINETIC ER) 10-8 MG/5ML SUER Take 5 mLs by mouth at bedtime as needed for cough. 240 mL 0  . clonazePAM (KLONOPIN) 0.5 MG tablet Take 0.5 tablets (0.25 mg total) by mouth 2 (two) times daily as needed for anxiety. If 1/2 tablet is not effective can take one whole tablet twice daily. 60 tablet 5  . Cyanocobalamin (RA VITAMIN B-12 TR) 1000 MCG TBCR Take by mouth.    . DULoxetine (CYMBALTA) 60 MG capsule Take 1 capsule (60 mg total) by mouth 2 (two) times daily. 60 capsule 6  . estradiol (ESTRACE) 0.5 MG tablet Take 1 tablet (0.5 mg total) by mouth daily. 30 tablet 6  . fexofenadine (ALLEGRA) 180 MG tablet Take 1 tablet (180 mg total) by mouth daily. [place]  tablet 1  . fluticasone (FLONASE) 50 MCG/ACT nasal spray Place 2 sprays into both nostrils daily. 48 g 1  . gabapentin (NEURONTIN) 300 MG capsule Take 4 capsules (1,200 mg total) by mouth at bedtime. 120 capsule 6  . ipratropium-albuterol (DUONEB) 0.5-2.5 (3) MG/3ML SOLN Take 3 mLs by nebulization every 6 (six) hours as needed. 360 mL 11  . levofloxacin (LEVAQUIN) 500 MG tablet Take 1 tablet (500 mg total) by mouth daily. 10 tablet 0  . levothyroxine (SYNTHROID, LEVOTHROID) 75 MCG tablet Take 1 tablet (75 mcg total) by mouth daily. 90 tablet 1  . losartan (COZAAR) 50 MG tablet Take 1 tablet (50 mg total) by mouth daily. 90 tablet 1  . medroxyPROGESTERone (PROVERA) 2.5 MG  tablet Take 1 tablet (2.5 mg total) by mouth daily. 30 tablet 6  . predniSONE (STERAPRED UNI-PAK 21 TAB) 10 MG (21) TBPK tablet Take as directed on package instructions 21 tablet 1  . theophylline (THEO-24) 300 MG 24 hr capsule Take 1 capsule (300 mg total) by mouth daily. 30 capsule 5  . tiotropium (SPIRIVA) 18 MCG inhalation capsule Place 1 capsule (18 mcg total) into inhaler and inhale daily. 30 capsule 11  . tiZANidine (ZANAFLEX) 4 MG capsule Take 1 capsule (4 mg total) by mouth 3 (three) times daily. 90 capsule 6  . Vitamin D, Ergocalciferol, (DRISDOL) 50000 units CAPS capsule Take 1 capsule (50,000 Units total) by mouth every 7 (seven) days. 12 capsule 3   No facility-administered medications prior to visit.      Allergies: Dexilant [dexlansoprazole]    PHYSICAL EXAM: VS:  BP 122/64 (BP Location: Right Arm, Patient Position: Sitting, Cuff Size: Large)   Pulse 84   Ht 5\' 11"  (1.803 m)   Wt 239 lb (108.4 kg)   BMI 33.33 kg/m  , Body mass index is 33.33 kg/m. Wt Readings from Last 3 Encounters:  11/24/16 239 lb (108.4 kg)  11/23/16 239 lb (108.4 kg)  11/07/16 239 lb (108.4 kg)    GENERAL:  well developed, well nourished, obese, not in acute distress HEENT: normocephalic, pink conjunctivae, anicteric sclerae, no xanthelasma, normal dentition, oropharynx clear NECK:  no neck vein engorgement, JVP normal, no hepatojugular reflux, carotid upstroke brisk and symmetric, no bruit, no thyromegaly, no lymphadenopathy LUNGS:  good respiratory effort, clear to auscultation bilaterally CV:  PMI not displaced, no thrills, no lifts, S1 and S2 within normal limits, no palpable S3 or S4, no murmurs, no rubs, no gallops ABD:  Soft, nontender, nondistended, normoactive bowel sounds, no abdominal aortic bruit, no hepatomegaly, no splenomegaly MS: nontender back, no kyphosis, no scoliosis, no joint deformities EXT:  2+ DP/PT pulses, no edema, no varicosities, no cyanosis, no clubbing SKIN: warm,  nondiaphoretic, normal turgor, no ulcers NEUROPSYCH: alert, oriented to person, place, and time, sensory/motor grossly intact, normal mood, appropriate affect  Recent Labs: 04/25/2016: Platelets 288; TSH 6.720   Lipid Panel No results found for: CHOL, TRIG, HDL, CHOLHDL, VLDL, LDLCALC, LDLDIRECT   Other studies Reviewed:  EKG:  The ekg from 4/26/2018was personally reviewed by me and it revealed SR 84 bpm          Additional studies/ records that were reviewed personally reviewed by me today include: none available   ASSESSMENT AND PLAN:  Palpitations rec holter  SOB Significant risk factors for CAD rec lexiscan stress test as pt can not walk on treadmill  Hypertension BP is well controlled. Continue monitoring BP. Continue current medical therapy and lifestyle changes.  Obesity Diet  changes, weight loss throug hexercise if able, once cardiac work up completed  Current medicines are reviewed at length with the patient today.  The patient does not have concerns regarding medicines.  Labs/ tests ordered today include:  Orders Placed This Encounter  Procedures  . NM Myocar Multi W/Spect W/Wall Motion / EF  . Holter monitor - 24 hour  . EKG 12-Lead  . ECHOCARDIOGRAM COMPLETE    I had a lengthy and detailed discussion with the patient regarding diagnoses, prognosis, diagnostic options.   Disposition:   FU with Cardiology after tests   Thank you for this consultation. We will forwarding this consultation to referring physician.   Signed, Wende Bushy, MD  11/24/2016 11:27 PM    [place]  This note was generated in part with voice recognition software and I apologize for any typographical errors that were not detected and corrected.

## 2016-11-24 NOTE — Patient Instructions (Signed)
Testing/Procedures: Your physician has requested that you have an echocardiogram. Echocardiography is a painless test that uses sound waves to create images of your heart. It provides your doctor with information about the size and shape of your heart and how well your heart's chambers and valves are working. This procedure takes approximately one hour. There are no restrictions for this procedure.  Your physician has recommended that you wear a holter monitor. Holter monitors are medical devices that record the heart's electrical activity. Doctors most often use these monitors to diagnose arrhythmias. Arrhythmias are problems with the speed or rhythm of the heartbeat. The monitor is a small, portable device. You can wear one while you do your normal daily activities. This is usually used to diagnose what is causing palpitations/syncope (passing out).  [place]  Your caregiver has ordered a Stress Test with nuclear imaging. The purpose of this test is to evaluate the blood supply to your heart muscle. This procedure is referred to as a "Non-Invasive Stress Test." This is because other than having an IV started in your vein, nothing is inserted or "invades" your body. Cardiac stress tests are done to find areas of poor blood flow to the heart by determining the extent of coronary artery disease (CAD). Some patients exercise on a treadmill, which naturally increases the blood flow to your heart, while others who are  unable to walk on a treadmill due to physical limitations have a pharmacologic/chemical stress agent called Lexiscan . This medicine will mimic walking on a treadmill by temporarily increasing your coronary blood flow.   Please note: these test may take anywhere between 2-4 hours to complete  PLEASE REPORT TO [facility] AT THE FIRST DESK WILL DIRECT YOU WHERE TO GO  Date of Procedure:__Wednesday May 9, 2018____  Arrival Time for Procedure:_Arrive at  08:45AM____   PLEASE NOTIFY THE OFFICE AT LEAST 24 HOURS IN ADVANCE IF YOU ARE UNABLE TO [facility].  [phone] AND  PLEASE NOTIFY NUCLEAR MEDICINE AT [facility] AT LEAST 24 HOURS IN ADVANCE IF YOU ARE UNABLE TO KEEP YOUR APPOINTMENT. [phone]  How to prepare for your Myoview test:  1. Do not eat or drink after midnight 2. No caffeine for 24 hours prior to test 3. No smoking 24 hours prior to test. 4. Your medication may be taken with water.  If your doctor stopped a medication because of this test, do not take that medication. 5. Ladies, please do not wear dresses.  Skirts or pants are appropriate. Please wear a short sleeve shirt. 6. No perfume, cologne or lotion. 7. Wear comfortable walking shoes. No heels!   Follow-Up: Your physician recommends that you schedule a follow-up appointment as needed. We will call you with results and if needed schedule follow up at that time.   It was a pleasure seeing you today here in the office. Please do not hesitate to give [place] a call back if you have any further questions. [place], BSN    Echocardiogram An echocardiogram, or echocardiography, uses sound waves (ultrasound) to produce an image of your heart. The echocardiogram is simple, painless, obtained within a short period of time, and offers valuable information to your health care provider. The images from an echocardiogram can provide information such as:  Evidence of coronary artery disease (CAD).  Heart size.  Heart muscle function.  Heart valve function.  Aneurysm detection.  Evidence of a past heart attack.  Fluid buildup around  the heart.  Heart muscle thickening.  Assess heart valve function. Tell a health care provider about:  Any allergies you have.  All medicines you are taking, including vitamins, herbs, eye drops, creams, and over-the-counter medicines.  Any problems you or family members have had with anesthetic  medicines.  Any blood disorders you have.  Any surgeries you have had.  Any medical conditions you have.  Whether you are pregnant or may be pregnant. What happens before the procedure? No special preparation is needed. Eat and drink normally. What happens during the procedure?  In order to produce an image of your heart, gel will be applied to your chest and a wand-like tool (transducer) will be moved over your chest. The gel will help transmit the sound waves from the transducer. The sound waves will harmlessly bounce off your heart to allow the heart images to be captured in real-time motion. These images will then be recorded.  You may need an IV to receive a medicine that improves the quality of the pictures. What happens after the procedure? You may return to your normal schedule including diet, activities, and medicines, unless your health care provider tells you otherwise. This information is not intended to replace advice given to you by your health care provider. Make sure you discuss any questions you have with your health care provider. Document Released: 07/15/2000 Document Revised: 03/05/2016 Document Reviewed: 03/25/2013 Elsevier Interactive Patient Education  2017 [place].  Holter Monitoring A Holter monitor is a small device that is used to detect abnormal heart rhythms. It clips to your clothing and is connected by wires to flat, sticky disks (electrodes) that attach to your chest. It is worn continuously for 24-48 hours. Follow these instructions at home:  Wear your Holter monitor at all times, even while exercising and sleeping, for as long as directed by your health care provider.  Make sure that the Holter monitor is safely clipped to your clothing or close to your body as recommended by your health care provider.  Do not get the monitor or wires wet.  Do not put body lotion or moisturizer on your chest.  Keep your skin clean.  Keep a diary of your  daily activities, such as walking and doing chores. If you feel that your heartbeat is abnormal or that your heart is fluttering or skipping a beat:  Record what you are doing when it happens.  Record what time of day the symptoms occur.  Return your Holter monitor as directed by your health care provider.  Keep all follow-up visits as directed by your health care provider. This is important. Get help right away if:  You feel lightheaded or you faint.  You have trouble breathing.  You feel pain in your chest, upper arm, or jaw.  You feel sick to your stomach and your skin is pale, cool, or damp.  You heartbeat feels unusual or abnormal. This information is not intended to replace advice given to you by your health care provider. Make sure you discuss any questions you have with your health care provider. Document Released: 04/15/2004 Document Revised: 12/24/2015 Document Reviewed: 02/24/2014 Elsevier Interactive Patient Education  2017 [place]. Pharmacologic Stress Electrocardiogram Introduction A pharmacologic stress electrocardiogram is a heart (cardiac) test that uses nuclear imaging to evaluate the blood supply to your heart. This test may also be called a pharmacologic stress electrocardiography. Pharmacologic means that a medicine is used to increase your heart rate and blood pressure. This stress test is  done to find areas of poor blood flow to the heart by determining the extent of coronary artery disease (CAD). Some people exercise on a treadmill, which naturally increases the blood flow to the heart. For those people unable to exercise on a treadmill, a medicine is used. This medicine stimulates your heart and will cause your heart to beat harder and more quickly, as if you were exercising. Pharmacologic stress tests can help determine:  The adequacy of blood flow to your heart during increased levels of activity in order to clear you for discharge home.  The extent of  coronary artery blockage caused by CAD.  Your prognosis if you have suffered a heart attack.  The effectiveness of cardiac procedures done, such as an angioplasty, which can increase the circulation in your coronary arteries.  Causes of chest pain or pressure. LET [facility] CARE PROVIDER KNOW ABOUT:  Any allergies you have.  All medicines you are taking, including vitamins, herbs, eye drops, creams, and over-the-counter medicines.  Previous problems you or members of your family have had with the use of anesthetics.  Any blood disorders you have.  Previous surgeries you have had.  Medical conditions you have.  Possibility of pregnancy, if this applies.  If you are currently breastfeeding. RISKS AND COMPLICATIONS Generally, this is a safe procedure. However, as with any procedure, complications can occur. Possible complications include:  You develop pain or pressure in the following areas:  Chest.  Jaw or neck.  Between your shoulder blades.  Radiating down your left arm.  Headache.  Dizziness or light-headedness.  Shortness of breath.  Increased or irregular heartbeat.  Low blood pressure.  Nausea or vomiting.  Flushing.  Redness going up the arm and slight pain during injection of medicine.  Heart attack (rare). BEFORE THE PROCEDURE  Avoid all forms of caffeine for 24 hours before your test or as directed by your health care provider. This includes coffee, tea (even decaffeinated tea), caffeinated sodas, chocolate, cocoa, and certain pain medicines.  Follow your health care provider's instructions regarding eating and drinking before the test.  Take your medicines as directed at regular times with water unless instructed otherwise. Exceptions may include:  If you have diabetes, ask how you are to take your insulin or pills. It is common to adjust insulin dosing the morning of the test.  If you are taking beta-blocker medicines, it is important to  talk to your health care provider about these medicines well before the date of your test. Taking beta-blocker medicines may interfere with the test. In some cases, these medicines need to be changed or stopped 24 hours or more before the test.  If you wear a nitroglycerin patch, it may need to be removed prior to the test. Ask your health care provider if the patch should be removed before the test.  If you use an inhaler for any breathing condition, bring it with you to the test.  If you are an outpatient, bring a snack so you can eat right after the stress phase of the test.  Do not smoke for 4 hours prior to the test or as directed by your health care provider.  Do not apply lotions, powders, creams, or oils on your chest prior to the test.  Wear comfortable shoes and clothing. Let your health care provider know if you were unable to complete or follow the preparations for your test. PROCEDURE  Multiple patches (electrodes) will be put on your chest. If needed, small  areas of your chest may be shaved to get better contact with the electrodes. Once the electrodes are attached to your body, multiple wires will be attached to the electrodes, and your heart rate will be monitored.  An IV access will be started. A nuclear trace (isotope) is given. The isotope may be given intravenously, or it may be swallowed. Nuclear refers to several types of radioactive isotopes, and the nuclear isotope lights up the arteries so that the nuclear images are clear. The isotope is absorbed by your body. This results in low radiation exposure.  A resting nuclear image is taken to show how your heart functions at rest.  A medicine is given through the IV access.  A second scan is done about 1 hour after the medicine injection and determines how your heart functions under stress.  During this stress phase, you will be connected to an electrocardiogram machine. Your blood pressure and oxygen levels will be  monitored. What to expect after the procedure  Your heart rate and blood pressure will be monitored after the test.  You may return to your normal schedule, including diet,activities, and medicines, unless your health care provider tells you otherwise. This information is not intended to replace advice given to you by your health care provider. Make sure you discuss any questions you have with your health care provider. Document Released: 12/04/2008 Document Revised: 12/24/2015 Document Reviewed: 01/25/2016 Elsevier Interactive Patient Education  2017 [employer].

## 2016-11-30 ENCOUNTER — Telehealth: Payer: Self-pay | Admitting: Physician Assistant

## 2016-11-30 DIAGNOSIS — B370 Candidal stomatitis: Secondary | ICD-10-CM

## 2016-11-30 DIAGNOSIS — T3695XA Adverse effect of unspecified systemic antibiotic, initial encounter: Principal | ICD-10-CM

## 2016-11-30 DIAGNOSIS — B379 Candidiasis, unspecified: Secondary | ICD-10-CM

## 2016-11-30 MED ORDER — FLUCONAZOLE 150 MG PO TABS: 150.0000 mg | ORAL_TABLET | ORAL | 0 refills | Status: DC | PRN

## 2016-11-30 MED ORDER — NYSTATIN 100000 UNIT/ML MT SUSP
5.0000 mL | Freq: Four times a day (QID) | OROMUCOSAL | 0 refills | Status: DC
Start: 2016-11-30 — End: 2017-03-22

## 2016-11-30 NOTE — Telephone Encounter (Signed)
Patient advised as directed below.  Thanks,  -Roschelle Calandra 

## 2016-11-30 NOTE — Telephone Encounter (Signed)
Pt states she has been on an antibiotic for 3 weeks and she is having a discharge, burning and itching in her vaginal area.  Pt is requesting a Rx to help with this.  Pt is requesting another sample of the Breo inhaler.  Pt also states that her mouth is burning and sore.  Pt is asking if she can get a Rx to help with this.  [facility].  TG#[phone]

## 2016-11-30 NOTE — Telephone Encounter (Signed)
Please Review.  Thanks,  -Kaelee Pfeffer 

## 2016-11-30 NOTE — Telephone Encounter (Signed)
Sent in diflucan and Nystatin suspension to swish and spit. She needs to make sure to wash her mouth out after breo to prevent thrush.  Ok to give a Breo 200-25 sample as well.

## 2016-12-02 ENCOUNTER — Ambulatory Visit (INDEPENDENT_AMBULATORY_CARE_PROVIDER_SITE_OTHER): Payer: BC Managed Care – PPO

## 2016-12-02 DIAGNOSIS — R002 Palpitations: Secondary | ICD-10-CM | POA: Diagnosis not present

## 2016-12-02 DIAGNOSIS — R0602 Shortness of breath: Secondary | ICD-10-CM | POA: Diagnosis not present

## 2016-12-05 ENCOUNTER — Other Ambulatory Visit: Payer: Self-pay | Admitting: Physician Assistant

## 2016-12-05 DIAGNOSIS — G2581 Restless legs syndrome: Secondary | ICD-10-CM

## 2016-12-05 DIAGNOSIS — J441 Chronic obstructive pulmonary disease with (acute) exacerbation: Secondary | ICD-10-CM

## 2016-12-05 MED ORDER — TIOTROPIUM BROMIDE MONOHYDRATE 18 MCG IN CAPS: 18.0000 ug | ORAL_CAPSULE | Freq: Every day | RESPIRATORY_TRACT | 11 refills | Status: DC

## 2016-12-05 MED ORDER — GABAPENTIN 300 MG PO CAPS: 1200.0000 mg | ORAL_CAPSULE | Freq: Every day | ORAL | 5 refills | Status: DC

## 2016-12-05 NOTE — Telephone Encounter (Addendum)
[facility] faxed a request on the following medications. Thanks CC  tiotropium (SPIRIVA) 18 MCG inhalation capsule  Inhale contents of 1 capsule by mouth through device one daily.   gabapentin (NEURONTIN) 300 MG capsule  Take 4 capsules by mouth at bedtime.

## 2016-12-07 ENCOUNTER — Encounter
Admission: RE | Admit: 2016-12-07 | Discharge: 2016-12-07 | Disposition: A | Discharge status: Home / Self Care | Payer: BLUE CROSS/BLUE SHIELD | Source: Ambulatory Visit | Attending: Cardiology | Admitting: Cardiology

## 2016-12-07 DIAGNOSIS — R0602 Shortness of breath: Secondary | ICD-10-CM | POA: Diagnosis not present

## 2016-12-07 DIAGNOSIS — R002 Palpitations: Secondary | ICD-10-CM | POA: Diagnosis not present

## 2016-12-07 MED ORDER — TECHNETIUM TC 99M TETROFOSMIN IV KIT
13.5100 | PACK | Freq: Once | INTRAVENOUS | Status: AC | PRN
  Administered 2016-12-07: 13.51 via INTRAVENOUS

## 2016-12-07 MED ORDER — TECHNETIUM TC 99M TETROFOSMIN IV KIT
31.8160 | PACK | Freq: Once | INTRAVENOUS | Status: AC | PRN
  Administered 2016-12-07: 31.816 via INTRAVENOUS

## 2016-12-07 MED ORDER — REGADENOSON 0.4 MG/5ML IV SOLN
0.4000 mg | Freq: Once | INTRAVENOUS | Status: AC
  Administered 2016-12-07: 0.4 mg via INTRAVENOUS

## 2016-12-08 LAB — NM MYOCAR MULTI W/SPECT W/WALL MOTION / EF
LV dias vol: 71 mL (ref 46–106)
LV sys vol: 32 mL
Peak HR: 93 {beats}/min
Percent HR: 58 %
Rest HR: 82 {beats}/min
SDS: 0
SRS: 0
SSS: 0
TID: 1.21

## 2016-12-09 ENCOUNTER — Ambulatory Visit
Admission: RE | Admit: 2016-12-09 | Discharge: 2016-12-09 | Disposition: A | Discharge status: Home / Self Care | Payer: BC Managed Care – PPO | Source: Ambulatory Visit | Attending: Cardiology | Admitting: Cardiology

## 2016-12-09 DIAGNOSIS — R0602 Shortness of breath: Secondary | ICD-10-CM | POA: Insufficient documentation

## 2016-12-09 DIAGNOSIS — R002 Palpitations: Secondary | ICD-10-CM | POA: Diagnosis not present

## 2016-12-14 ENCOUNTER — Other Ambulatory Visit: Payer: Self-pay | Admitting: Physician Assistant

## 2016-12-14 DIAGNOSIS — J418 Mixed simple and mucopurulent chronic bronchitis: Secondary | ICD-10-CM

## 2016-12-14 DIAGNOSIS — M1711 Unilateral primary osteoarthritis, right knee: Secondary | ICD-10-CM | POA: Diagnosis not present

## 2016-12-14 MED ORDER — FLUTICASONE FUROATE-VILANTEROL 200-25 MCG/INH IN AEPB: 1.0000 | INHALATION_SPRAY | Freq: Every day | RESPIRATORY_TRACT | 5 refills | Status: DC

## 2016-12-14 NOTE — Telephone Encounter (Signed)
Pt states she rec'd samples for a Breo inhaler.  Pt states it is really helping and is requesting a Rx.  [facility].  BN#[phone]

## 2016-12-15 ENCOUNTER — Other Ambulatory Visit: Payer: Self-pay | Admitting: Physician Assistant

## 2016-12-15 DIAGNOSIS — F329 Major depressive disorder, single episode, unspecified: Secondary | ICD-10-CM

## 2016-12-15 DIAGNOSIS — F32A Depression, unspecified: Secondary | ICD-10-CM

## 2016-12-15 MED ORDER — DULOXETINE HCL 60 MG PO CPEP: 60.0000 mg | ORAL_CAPSULE | Freq: Two times a day (BID) | ORAL | 6 refills | Status: DC

## 2016-12-15 NOTE — Telephone Encounter (Signed)
LOV 11/23/2016. Renaldo Fiddler, CMA

## 2016-12-15 NOTE — Telephone Encounter (Signed)
[facility] faxed a request on the following medication. Thanks CC  DULoxetine (CYMBALTA) 60 MG capsule  Take 1 capsule by mouth twice daily.

## 2016-12-27 DIAGNOSIS — M1712 Unilateral primary osteoarthritis, left knee: Secondary | ICD-10-CM | POA: Diagnosis not present

## 2017-01-03 ENCOUNTER — Ambulatory Visit (INDEPENDENT_AMBULATORY_CARE_PROVIDER_SITE_OTHER): Payer: BLUE CROSS/BLUE SHIELD

## 2017-01-03 ENCOUNTER — Other Ambulatory Visit: Payer: Self-pay

## 2017-01-03 DIAGNOSIS — R0602 Shortness of breath: Secondary | ICD-10-CM

## 2017-01-03 DIAGNOSIS — R002 Palpitations: Secondary | ICD-10-CM

## 2017-01-04 ENCOUNTER — Telehealth: Payer: Self-pay

## 2017-01-04 DIAGNOSIS — Z716 Tobacco abuse counseling: Secondary | ICD-10-CM

## 2017-01-04 LAB — ECHOCARDIOGRAM COMPLETE
AO mean calculated velocity dopler: 99.5 cm/s
AV Area VTI index: 1.06 cm2/m2
AV Area mean vel: 1.94 cm2
AV VEL mean LVOT/AV: 0.62
AV area mean vel ind: 0.86 cm2/m2
AV vel: 2.4
Ao-asc: 31 cm
Area-P 1/2: 3.19 cm2
E decel time: 236 msec
E/e' ratio: 9.6
FS: 32 % (ref 28–44)
IVS/LV PW RATIO, ED: 1
LA ID, A-P, ES: 35 mm
LA diam end sys: 35 mm
LA diam index: 1.54 cm/m2
LA vol A4C: 49.3 ml
LA vol index: 27.3 mL/m2
LA vol: 62 mL
LV E/e' medial: 9.6
LV E/e'average: 9.6
LV PW d: 14 mm — AB (ref 0.6–1.1)
LV e' LATERAL: 6.31 cm/s
LVOT SV: 62 mL
LVOT VTI: 19.8 cm
LVOT area: 3.14 cm2
LVOT diameter: 20 mm
LVOT peak VTI: 0.76 cm
Lateral S' vel: 18 cm/s
MV Dec: 236
MV pk A vel: 74.7 m/s
MV pk E vel: 60.6 m/s
Mean grad: 5 mmHg
P 1/2 time: 69 ms
TAPSE: 20.1 mm
TDI e' lateral: 6.31
TDI e' medial: 5.66
VTI: 25.9 cm
Valve area index: 1.06
Valve area: 2.4 cm2

## 2017-01-04 MED ORDER — VARENICLINE TARTRATE 0.5 MG X 11 & 1 MG X 42 PO MISC: ORAL | 0 refills | Status: DC

## 2017-01-04 NOTE — Telephone Encounter (Signed)
[facility] pharmacy fax a refill request for Chantix 0.5 and 1MG  tab Qty: 53  Sig: Take as directed  Thanks,  -Aunisty Reali

## 2017-01-04 NOTE — Telephone Encounter (Signed)
Chantix starting pak sent in

## 2017-01-06 ENCOUNTER — Telehealth: Payer: Self-pay | Admitting: *Deleted

## 2017-01-06 NOTE — Telephone Encounter (Signed)
-----   Message from Nelva Bush, MD sent at 01/05/2017 10:50 AM EDT ----- Echo shows LVH, grade 1 diastolic dysfunction, and mild aortic regurgitation. I recommend starting metoprolol tartrate 12.5 mg BID and initiation of exercise/weight loss regimen. She should f/u with any available provider in ~3 months, sooner if symptoms worsen.

## 2017-01-06 NOTE — Telephone Encounter (Signed)
Spoke with patients husband per release form and he states that she is currently out of state doing a conference. Reviewed results and recommendations with him and he requested that we give her a call on Monday to review with her. He reports that she has been working on weight loss and has lost 6 pounds so far. He was appreciative for the call and had no further questions at this time. Let him know that I would make a note to call back on Monday.

## 2017-01-10 MED ORDER — METOPROLOL TARTRATE 25 MG PO TABS: 12.5000 mg | ORAL_TABLET | Freq: Two times a day (BID) | ORAL | 3 refills | Status: DC

## 2017-01-10 NOTE — Telephone Encounter (Signed)
Scheduled for 04/18/17 with Dr Rockey Situ

## 2017-01-10 NOTE — Telephone Encounter (Signed)
Spoke with patient at length reviewing results and recommendations. She verbalized understanding of all information reviewed regarding metoprolol 12.5 mg twice a day and follow up in roughly 3 months and if sooner if symptoms persist or worsen. Also discussed importance of blood pressure monitoring and lifestyle changes for weight loss along with regular exercise. She verbalized understanding of our conversation, agreement with plan, and had no further questions at this time. Let her know that I would have someone from scheduling to give her a call and schedule follow up appointment.

## 2017-01-11 ENCOUNTER — Telehealth: Payer: Self-pay | Admitting: Cardiovascular Disease

## 2017-01-11 NOTE — Telephone Encounter (Signed)
Pt calling stating that we were going to send in medication   She went to pharmacy and picked up a lopressor   Just wants to make sure this is what we were to send in  She was told another medication and she picked up another  Please advise

## 2017-01-11 NOTE — Telephone Encounter (Signed)
Reviewed with patient that Metoprolol is the generic name and Lopressor is the name brand. She verbalized understanding that these were the same and she understood instructions for taking this medication. She was appreciative for my call and clarification and had no further questions at this time.

## 2017-01-26 ENCOUNTER — Telehealth: Payer: Self-pay | Admitting: Cardiovascular Disease

## 2017-01-26 NOTE — Telephone Encounter (Signed)
Reviewed recommendations with patient and she verbalized understanding to stop medication and call PCP if no improvement. Also instructed her to monitor blood pressures and keep follow up as scheduled. She verbalized understanding of our conversation, agreement with plan, and had no further questions at this time.

## 2017-01-26 NOTE — Telephone Encounter (Signed)
Pt thinks she is having a reaction to Lopressor, she states she feels like something is "creepin" on her skin. She also states she is itching. States she does not have a rash.

## 2017-01-26 NOTE — Telephone Encounter (Signed)
Patient calling in stating that she was recently started on Metoprolol and has developed a "Creepy, Crawly, sensation" on her skin. She reviewed side effects and noticed that the bottle has these listed. She reports that she has taken benadryl and her symptoms resolved but she wanted to check with [place] about this. She denies any swellin, rash, or other symptoms. Let her know that I would forward for someone to review and then be in touch with her. Instructed her to see care if she develops any swelling, shortness of breath, or tingling in mouth. She verbalized understanding of our conversation, agreement with plan had has no further questions at this time.

## 2017-01-26 NOTE — Telephone Encounter (Signed)
Please have Ms. Goshorn stop taking metoprolol. If her symptoms do not improve over the next 24-48 hours, she should contact her PCP for further evaluation. F/u with Dr. Rockey Situ as planned. Thanks.  Nelva Bush, MD [facility] Pager: [phone]

## 2017-02-03 DIAGNOSIS — M79644 Pain in right finger(s): Secondary | ICD-10-CM | POA: Diagnosis not present

## 2017-02-03 DIAGNOSIS — M19041 Primary osteoarthritis, right hand: Secondary | ICD-10-CM | POA: Diagnosis not present

## 2017-02-09 ENCOUNTER — Other Ambulatory Visit: Payer: Self-pay | Admitting: Physician Assistant

## 2017-02-09 MED ORDER — TIZANIDINE HCL 4 MG PO CAPS: 4.0000 mg | ORAL_CAPSULE | Freq: Three times a day (TID) | ORAL | 6 refills | Status: DC

## 2017-02-09 NOTE — Telephone Encounter (Signed)
[facility] faxed a request on the following medication. Thanks CC  tiZANidine (ZANAFLEX) 4 MG capsule  > Take 1 capsule by mouth 3 times daily.

## 2017-02-21 ENCOUNTER — Telehealth: Payer: Self-pay | Admitting: Physician Assistant

## 2017-02-21 NOTE — Telephone Encounter (Signed)
[facility] faxed a request on the following medication. Thanks CC  levothyroxine (SYNTHROID, LEVOTHROID) 75 MCG tablet  >Take 1 tablet by mouth once daily.

## 2017-02-28 ENCOUNTER — Other Ambulatory Visit: Payer: Self-pay | Admitting: Physician Assistant

## 2017-02-28 DIAGNOSIS — F419 Anxiety disorder, unspecified: Secondary | ICD-10-CM

## 2017-02-28 DIAGNOSIS — E039 Hypothyroidism, unspecified: Secondary | ICD-10-CM

## 2017-02-28 MED ORDER — CLONAZEPAM 0.5 MG PO TABS: 0.2500 mg | ORAL_TABLET | Freq: Two times a day (BID) | ORAL | 5 refills | Status: DC | PRN

## 2017-02-28 MED ORDER — LEVOTHYROXINE SODIUM 75 MCG PO TABS: 75.0000 ug | ORAL_TABLET | Freq: Every day | ORAL | 1 refills | Status: DC

## 2017-02-28 NOTE — Telephone Encounter (Signed)
[facility] faxed a request on the following medications.  Thanks CC  levothyroxine (SYNTHROID, LEVOTHROID) 75 MCG tablet  >Take 1 tablet by mouth once daily.  clonazePAM (KLONOPIN) 0.5 MG tablet

## 2017-02-28 NOTE — Telephone Encounter (Signed)
Called in to [place].

## 2017-02-28 NOTE — Telephone Encounter (Signed)
LOV 11/23/2016. I do not see where her last TSH was done.

## 2017-03-03 ENCOUNTER — Telehealth: Payer: Self-pay

## 2017-03-03 NOTE — Telephone Encounter (Signed)
[facility] advised.

## 2017-03-03 NOTE — Telephone Encounter (Signed)
Noted. Thank you!  Can we please notify [facility]. Thanks.

## 2017-03-03 NOTE — Telephone Encounter (Signed)
I think this was d/c in 07/2016. Can we verify with patient?

## 2017-03-03 NOTE — Telephone Encounter (Signed)
Refill Request from [facility]  Medication: Seroquel XR 50MG  TER  Qty:30 Take 1 tablet by mouth at bedtime.  Please Review.  Thanks,  -Eyla Tallon

## 2017-03-03 NOTE — Telephone Encounter (Signed)
Per patient she has not been taking this medication for a while and she did not request a refill.  Thanks,  -Maddix Heinz

## 2017-03-17 DIAGNOSIS — M1712 Unilateral primary osteoarthritis, left knee: Secondary | ICD-10-CM | POA: Diagnosis not present

## 2017-03-17 DIAGNOSIS — M170 Bilateral primary osteoarthritis of knee: Secondary | ICD-10-CM | POA: Diagnosis not present

## 2017-03-17 DIAGNOSIS — M1711 Unilateral primary osteoarthritis, right knee: Secondary | ICD-10-CM | POA: Diagnosis not present

## 2017-03-22 ENCOUNTER — Other Ambulatory Visit: Payer: Self-pay | Admitting: Physician Assistant

## 2017-03-22 DIAGNOSIS — B370 Candidal stomatitis: Secondary | ICD-10-CM

## 2017-03-22 MED ORDER — NYSTATIN 100000 UNIT/ML MT SUSP: OROMUCOSAL | 3 refills | Status: DC

## 2017-03-22 NOTE — Telephone Encounter (Addendum)
Linda Bradford faxed a refill request on the following medications:  nystatin (MYCOSTATIN) 100000 UNIT/ML suspension.  Take 1 teaspoonful by mouth 4 times a daily swish & spit.  nystatin (MYCOSTATIN) 100000 UNIT/ML suspension.  Take 1 tablet by mouth every 3 days as needed    Linda Bradford/MW

## 2017-03-22 NOTE — Telephone Encounter (Signed)
Please Review.  Thanks,  -Joseline 

## 2017-04-06 ENCOUNTER — Encounter: Payer: Self-pay | Admitting: Physician Assistant

## 2017-04-06 ENCOUNTER — Ambulatory Visit (INDEPENDENT_AMBULATORY_CARE_PROVIDER_SITE_OTHER): Payer: BLUE CROSS/BLUE SHIELD | Admitting: Physician Assistant

## 2017-04-06 VITALS — BP 120/70 | HR 87 | Temp 98.5°F | Resp 20 | Ht 70.0 in | Wt 221.4 lb

## 2017-04-06 DIAGNOSIS — T3695XA Adverse effect of unspecified systemic antibiotic, initial encounter: Secondary | ICD-10-CM

## 2017-04-06 DIAGNOSIS — J0100 Acute maxillary sinusitis, unspecified: Secondary | ICD-10-CM

## 2017-04-06 DIAGNOSIS — B379 Candidiasis, unspecified: Secondary | ICD-10-CM | POA: Diagnosis not present

## 2017-04-06 DIAGNOSIS — J441 Chronic obstructive pulmonary disease with (acute) exacerbation: Secondary | ICD-10-CM

## 2017-04-06 MED ORDER — HYDROCOD POLST-CPM POLST ER 10-8 MG/5ML PO SUER: 5.0000 mL | Freq: Every evening | ORAL | 0 refills | Status: DC | PRN

## 2017-04-06 MED ORDER — FLUCONAZOLE 150 MG PO TABS: 150.0000 mg | ORAL_TABLET | ORAL | 0 refills | Status: DC | PRN

## 2017-04-06 MED ORDER — AMOXICILLIN-POT CLAVULANATE 875-125 MG PO TABS: 1.0000 | ORAL_TABLET | Freq: Two times a day (BID) | ORAL | 0 refills | Status: DC

## 2017-04-06 NOTE — Patient Instructions (Signed)

## 2017-04-06 NOTE — Progress Notes (Signed)
Patient: Linda Bradford Female    DOB: 1956/06/06   61 y.o.   MRN: [id number] Visit Date: 04/06/2017  Today's Provider: Mar Daring, PA-C   Chief Complaint  Patient presents with  . URI   Subjective:    HPI Upper Respiratory Infection: Patient complains of symptoms of a URI, possible sinusitis. Symptoms include congestion and cough. Onset of symptoms was 2 days ago, gradually worsening since that time. She also c/o facial pain, nasal congestion, post nasal drip, productive cough with  brown colored sputum and shortness of breath for the past 1 day .  She is drinking plenty of fluids. Evaluation to date: none. Treatment to date: antihistamines, cough suppressants, decongestants and nasal steroids.  Patient does have COPD.    Allergies  Allergen Reactions  . Dexilant [Dexlansoprazole] Nausea Only and Other (See Comments)    Dizziness     Current Outpatient Prescriptions:  .  acetaminophen (TYLENOL) 500 MG tablet, Take 1,000 mg by mouth every 6 (six) hours as needed., Disp: , Rfl:  .  albuterol (PROVENTIL HFA;VENTOLIN HFA) 108 (90 Base) MCG/ACT inhaler, Inhale 1 puff into the lungs every 4 (four) hours as needed., Disp: 1 Inhaler, Rfl: 11 .  aspirin 81 MG tablet, Take 81 mg by mouth., Disp: , Rfl:  .  benzonatate (TESSALON) 200 MG capsule, Take 1 capsule (200 mg total) by mouth 3 (three) times daily as needed for cough., Disp: 90 capsule, Rfl: 3 .  clonazePAM (KLONOPIN) 0.5 MG tablet, Take 0.5 tablets (0.25 mg total) by mouth 2 (two) times daily as needed for anxiety. If 1/2 tablet is not effective can take one whole tablet twice daily., Disp: 60 tablet, Rfl: 5 .  Cyanocobalamin (RA VITAMIN B-12 TR) 1000 MCG TBCR, Take by mouth., Disp: , Rfl:  .  diclofenac sodium (VOLTAREN) 1 % GEL, Apply topically., Disp: , Rfl:  .  DULoxetine (CYMBALTA) 60 MG capsule, Take 1 capsule (60 mg total) by mouth 2 (two) times daily., Disp: 60 capsule, Rfl: 6 .  fluticasone (FLONASE) 50 MCG/ACT  nasal spray, Place 2 sprays into both nostrils daily., Disp: 48 g, Rfl: 1 .  fluticasone furoate-vilanterol (BREO ELLIPTA) 200-25 MCG/INH AEPB, Inhale 1 puff into the lungs daily., Disp: 60 each, Rfl: 5 .  gabapentin (NEURONTIN) 300 MG capsule, Take 4 capsules (1,200 mg total) by mouth at bedtime., Disp: 120 capsule, Rfl: 5 .  ipratropium-albuterol (DUONEB) 0.5-2.5 (3) MG/3ML SOLN, Take 3 mLs by nebulization every 6 (six) hours as needed., Disp: 360 mL, Rfl: 11 .  levothyroxine (SYNTHROID, LEVOTHROID) 75 MCG tablet, Take 1 tablet (75 mcg total) by mouth daily., Disp: 90 tablet, Rfl: 1 .  losartan (COZAAR) 50 MG tablet, Take 1 tablet (50 mg total) by mouth daily., Disp: 90 tablet, Rfl: 1 .  nystatin (MYCOSTATIN) 100000 UNIT/ML suspension, Take 1 teaspoon PO every 3 days prn. Swish and spit, Disp: 60 mL, Rfl: 3 .  theophylline (THEO-24) 300 MG 24 hr capsule, Take 1 capsule (300 mg total) by mouth daily., Disp: 30 capsule, Rfl: 5 .  tiZANidine (ZANAFLEX) 4 MG capsule, Take 1 capsule (4 mg total) by mouth 3 (three) times daily., Disp: 90 capsule, Rfl: 6 .  chlorpheniramine-HYDROcodone (TUSSIONEX PENNKINETIC ER) 10-8 MG/5ML SUER, Take 5 mLs by mouth at bedtime as needed for cough. (Patient not taking: Reported on 04/06/2017), Disp: 240 mL, Rfl: 0 .  estradiol (ESTRACE) 0.5 MG tablet, Take 1 tablet (0.5 mg total) by mouth daily. (Patient not  taking: Reported on 04/06/2017), Disp: 30 tablet, Rfl: 6 .  fexofenadine (ALLEGRA) 180 MG tablet, Take 1 tablet (180 mg total) by mouth daily. (Patient not taking: Reported on 04/06/2017), Disp: 90 tablet, Rfl: 1 .  fluconazole (DIFLUCAN) 150 MG tablet, Take 1 tablet (150 mg total) by mouth every 3 (three) days as needed. (Patient not taking: Reported on 04/06/2017), Disp: 2 tablet, Rfl: 0 .  levofloxacin (LEVAQUIN) 500 MG tablet, Take 1 tablet (500 mg total) by mouth daily. (Patient not taking: Reported on 04/06/2017), Disp: 10 tablet, Rfl: 0 .  medroxyPROGESTERone (PROVERA) 2.5  MG tablet, Take 1 tablet (2.5 mg total) by mouth daily. (Patient not taking: Reported on 04/06/2017), Disp: 30 tablet, Rfl: 6 .  nabumetone (RELAFEN) 500 MG tablet, Take 500 mg by mouth 2 (two) times daily., Disp: , Rfl: 1 .  predniSONE (STERAPRED UNI-PAK 21 TAB) 10 MG (21) TBPK tablet, Take as directed on package instructions (Patient not taking: Reported on 04/06/2017), Disp: 21 tablet, Rfl: 1 .  tiotropium (SPIRIVA) 18 MCG inhalation capsule, Place 1 capsule (18 mcg total) into inhaler and inhale daily. (Patient not taking: Reported on 04/06/2017), Disp: 30 capsule, Rfl: 11 .  varenicline (CHANTIX STARTING MONTH PAK) 0.5 MG X 11 & 1 MG X 42 tablet, Take one 0.5 mg tab by mouth once daily x 3 days, then increase to one 0.5 mg tab BID for 4 days, then increase to one 1 mg tab BID. (Patient not taking: Reported on 04/06/2017), Disp: 53 tablet, Rfl: 0 .  Vitamin D, Ergocalciferol, (DRISDOL) 50000 units CAPS capsule, Take 1 capsule (50,000 Units total) by mouth every 7 (seven) days. (Patient not taking: Reported on 04/06/2017), Disp: 12 capsule, Rfl: 3  Review of Systems  Constitutional: Positive for fatigue. Negative for chills and fever.  HENT: Positive for congestion, postnasal drip, rhinorrhea, sinus pain, sinus pressure and sneezing.   Respiratory: Positive for cough, shortness of breath and wheezing. Negative for chest tightness.   Cardiovascular: Negative for chest pain, palpitations and leg swelling.  Gastrointestinal: Negative for abdominal pain.  Neurological: Positive for headaches. Negative for dizziness.    Social History  Substance Use Topics  . Smoking status: Current Every Day Smoker    Packs/day: 1.00    Years: 45.00    Types: Cigarettes    Start date: 03/02/1980  . Smokeless tobacco: Never Used     Comment: every other day  . Alcohol use No   Objective:   BP 120/70 (BP Location: Left Arm, Patient Position: Sitting, Cuff Size: Large)   Pulse 87   Temp 98.5 F (36.9 C) (Oral)   Resp  20   Ht 5\' 10"  (1.778 m)   Wt 221 lb 6.4 oz (100.4 kg)   SpO2 96%   BMI 31.77 kg/m  Vitals:   04/06/17 1040  BP: 120/70  Pulse: 87  Resp: 20  Temp: 98.5 F (36.9 C)  TempSrc: Oral  SpO2: 96%  Weight: 221 lb 6.4 oz (100.4 kg)  Height: 5\' 10"  (1.778 m)     Physical Exam  Constitutional: She appears well-developed and well-nourished. No distress.  HENT:  Head: Normocephalic and atraumatic.  Right Ear: Hearing, tympanic membrane, external ear and ear canal normal.  Left Ear: Hearing, tympanic membrane, external ear and ear canal normal.  Nose: Right sinus exhibits maxillary sinus tenderness and frontal sinus tenderness. Left sinus exhibits maxillary sinus tenderness and frontal sinus tenderness.  Mouth/Throat: Uvula is midline, oropharynx is clear and moist and mucous membranes  are normal. No oropharyngeal exudate.  Neck: Normal range of motion. Neck supple. No tracheal deviation present. No thyromegaly present.  Cardiovascular: Normal rate, regular rhythm and normal heart sounds.  Exam reveals no gallop and no friction rub.   No murmur heard. Pulmonary/Chest: Effort normal. No stridor. No respiratory distress. She has wheezes (throughout). She has no rales.  Lymphadenopathy:    She has no cervical adenopathy.  Skin: She is not diaphoretic.  Vitals reviewed.     Assessment & Plan:     1. Acute non-recurrent maxillary sinusitis Worsening symptoms that have not responded to OTC medications. Will give augmentin as below. Continue allergy medications. Stay well hydrated and get plenty of rest. Call if no symptom improvement or if symptoms worsen. - amoxicillin-clavulanate (AUGMENTIN) 875-125 MG tablet; Take 1 tablet by mouth 2 (two) times daily.  Dispense: 20 tablet; Refill: 0  2. Antibiotic-induced yeast infection Diflucan given as prophylaxis as patient tends to get yeast infections with augmentin use.  - fluconazole (DIFLUCAN) 150 MG tablet; Take 1 tablet (150 mg total) by  mouth every 3 (three) days as needed.  Dispense: 2 tablet; Refill: 0  3. Chronic obstructive pulmonary disease with acute exacerbation (HCC) Worsening symptoms that has not responded to OTC medications. Will give Tussionex cough syrup as below for nighttime cough. Drowsiness precautions given to patient. Stay well hydrated. Use delsym, robitussin OR mucinex for daytime cough. - chlorpheniramine-HYDROcodone (TUSSIONEX PENNKINETIC ER) 10-8 MG/5ML SUER; Take 5 mLs by mouth at bedtime as needed for cough.  Dispense: 240 mL; Refill: 0       Mar Daring, PA-C  [facility]

## 2017-04-16 NOTE — Progress Notes (Signed)
Cardiology Office Note  Date:  04/18/2017   ID:  Linda Bradford, DOB Sep 21, 1955, MRN [id number]  PCP:  Mar Daring, PA-C   Chief Complaint  Patient presents with  . other    5m fu per per note 01/10/17/former patient of Dr Yvone Neu. Pt c/o flutter sensation and sob r/t copd-currently has cold; denies cp. Reviewed meds with pt verbally.    HPI:  Linda Bradford is a 61 y.o. female with hx of  HTN Smoking hx, continues to smoke COPD, shortness of breath obesity palpitations, PVCs on previous Holter monitor Who presents for follow-up of her palpitations  In follow-up she reports that she is doing well, Some stress at home,  Feels stress could be contributing to palpitations Previous Holter monitor reviewed with her in detail showing rare PVCs APCs  She try Lopressor but had side effects, stopped medication  PreviousCT lung screen 05/2016 reviewed with her in detail, images pulled up in the office  No significant coronary calcification noted, minimal aortic plaque   Feels her breathing has improved, 13 pound Weight loss, shakes, eating better Denies any chest pain  EKG personally reviewed by myself on todays visit Shows normal sinus rhythm rate 82 bpm no significant ST or T-wave changes   other past medical history reviewed Stress lexiscan myoview, no ischemia, EF 72% Holter: normal Echo with normal EF, gd 1 diastolic dysf   PMH:   has a past medical history of Anxiety; Asthma; Depression; Fatigue; Headache; Hypertension; and Thyroid disease.  PSH:    Past Surgical History:  Procedure Laterality Date  . APPENDECTOMY    . TUBAL LIGATION      Current Outpatient Prescriptions  Medication Sig Dispense Refill  . acetaminophen (TYLENOL) 500 MG tablet Take 1,000 mg by mouth every 6 (six) hours as needed.    Marland Kitchen albuterol (PROVENTIL HFA;VENTOLIN HFA) 108 (90 Base) MCG/ACT inhaler Inhale 1 puff into the lungs every 4 (four) hours as needed. 1 Inhaler 11  . aspirin 81 MG tablet  Take 81 mg by mouth.    . benzonatate (TESSALON) 200 MG capsule Take 1 capsule (200 mg total) by mouth 3 (three) times daily as needed for cough. 90 capsule 3  . clonazePAM (KLONOPIN) 0.5 MG tablet Take 0.5 tablets (0.25 mg total) by mouth 2 (two) times daily as needed for anxiety. If 1/2 tablet is not effective can take one whole tablet twice daily. 60 tablet 5  . Cyanocobalamin (RA VITAMIN B-12 TR) 1000 MCG TBCR Take by mouth.    . diclofenac sodium (VOLTAREN) 1 % GEL Apply topically.    . DULoxetine (CYMBALTA) 60 MG capsule Take 1 capsule (60 mg total) by mouth 2 (two) times daily. 60 capsule 6  . fexofenadine (ALLEGRA) 180 MG tablet Take 1 tablet (180 mg total) by mouth daily. 90 tablet 1  . fluconazole (DIFLUCAN) 150 MG tablet Take 1 tablet (150 mg total) by mouth every 3 (three) days as needed. 2 tablet 0  . fluticasone (FLONASE) 50 MCG/ACT nasal spray Place 2 sprays into both nostrils daily. 48 g 1  . fluticasone furoate-vilanterol (BREO ELLIPTA) 200-25 MCG/INH AEPB Inhale 1 puff into the lungs daily. 60 each 5  . gabapentin (NEURONTIN) 300 MG capsule Take 4 capsules (1,200 mg total) by mouth at bedtime. 120 capsule 5  . ipratropium-albuterol (DUONEB) 0.5-2.5 (3) MG/3ML SOLN Take 3 mLs by nebulization every 6 (six) hours as needed. 360 mL 11  . levothyroxine (SYNTHROID, LEVOTHROID) 75 MCG tablet Take 1 tablet (75  mcg total) by mouth daily. 90 tablet 1  . losartan (COZAAR) 50 MG tablet Take 1 tablet (50 mg total) by mouth daily. 90 tablet 1  . nabumetone (RELAFEN) 500 MG tablet Take 500 mg by mouth 2 (two) times daily.  1  . nystatin (MYCOSTATIN) 100000 UNIT/ML suspension Take 1 teaspoon PO every 3 days prn. Swish and spit 60 mL 3  . theophylline (THEO-24) 300 MG 24 hr capsule Take 1 capsule (300 mg total) by mouth daily. 30 capsule 5  . tiotropium (SPIRIVA) 18 MCG inhalation capsule Place 1 capsule (18 mcg total) into inhaler and inhale daily. 30 capsule 11  . tiZANidine (ZANAFLEX) 4 MG  capsule Take 1 capsule (4 mg total) by mouth 3 (three) times daily. 90 capsule 6  . Vitamin D, Ergocalciferol, (DRISDOL) 50000 units CAPS capsule Take 1 capsule (50,000 Units total) by mouth every 7 (seven) days. 12 capsule 3   No current facility-administered medications for this visit.      Allergies:   Dexilant [dexlansoprazole]   Social History:  The patient  reports that she has been smoking Cigarettes.  She started smoking about 37 years ago. She has a 45.00 pack-year smoking history. She has never used smokeless tobacco. She reports that she does not drink alcohol or use drugs.   Family History:   family history includes Alcohol abuse in her brother, maternal grandmother, maternal uncle, sister, and son; Arthritis in her sister; Breast cancer in her brother; Diabetes in her sister; Drug abuse in her son and son; Heart attack in her brother; Heart disease (age of onset: 31) in her father; Heart disease (age of onset: 36) in her brother; Lung cancer in her mother; Pancreatic cancer in her maternal aunt.    Review of Systems: Review of Systems  Constitutional: Negative.   Respiratory: Negative.   Cardiovascular: Negative.   Gastrointestinal: Negative.   Musculoskeletal: Negative.   Neurological: Negative.   Psychiatric/Behavioral: Negative.   All other systems reviewed and are negative.    PHYSICAL EXAM: VS:  BP 128/76 (BP Location: Left Arm, Patient Position: Sitting, Cuff Size: Large)   Pulse 82   Ht 5\' 11"  (1.803 m)   Wt 226 lb 12 oz (102.9 kg)   BMI 31.63 kg/m  , BMI Body mass index is 31.63 kg/m. GEN: Well nourished, well developed, in no acute distress  HEENT: normal  Neck: no JVD, carotid bruits, or masses Cardiac: RRR; no murmurs, rubs, or gallops,no edema  Respiratory:  clear to auscultation bilaterally, normal work of breathing GI: soft, nontender, nondistended, + BS MS: no deformity or atrophy  Skin: warm and dry, no rash Neuro:  Strength and sensation are  intact Psych: euthymic mood, full affect    Recent Labs: 04/25/2016: Hemoglobin 14.3; Platelets 288; TSH 6.720    Lipid Panel No results found for: CHOL, HDL, LDLCALC, TRIG    Wt Readings from Last 3 Encounters:  04/18/17 226 lb 12 oz (102.9 kg)  04/06/17 221 lb 6.4 oz (100.4 kg)  11/24/16 239 lb (108.4 kg)       ASSESSMENT AND PLAN:  Alcohol abuse, in remission Recommended alcohol cessation  Aortic atherosclerosis Minimal aortic atherosclerosis seen on CT scan No coronary calcifications noted Recommended smoking cessation  Depression, major, recurrent, moderate (HCC)  Anxiety, generalized  Centrilobular emphysema (HCC)  Fibromyalgia  Chronic bronchitis, unspecified chronic bronchitis type ([place])  Current tobacco use We have encouraged her to continue to work on weaning her cigarettes and smoking cessation. She will  continue to work on this and does not want any assistance with chantix.   Shortness of breath - Plan: EKG 12-Lead Shortness of breath likely secondary to deconditioning, COPD, continues to smoke No further ischemic workup needed  PVCs Rare PVC and APCs on holter  Disposition:   F/U  12 months prn   Total encounter time more than 25 minutes  Greater than 50% was spent in counseling and coordination of care with the patient    Orders Placed This Encounter  Procedures  . EKG 12-Lead     Signed, Esmond Plants, M.D., Ph.D. 04/18/2017  [place], [place]

## 2017-04-18 ENCOUNTER — Ambulatory Visit (INDEPENDENT_AMBULATORY_CARE_PROVIDER_SITE_OTHER): Payer: BLUE CROSS/BLUE SHIELD | Admitting: Cardiovascular Disease

## 2017-04-18 ENCOUNTER — Encounter: Payer: Self-pay | Admitting: Cardiovascular Disease

## 2017-04-18 VITALS — BP 128/76 | HR 82 | Ht 71.0 in | Wt 226.8 lb

## 2017-04-18 DIAGNOSIS — R0602 Shortness of breath: Secondary | ICD-10-CM | POA: Diagnosis not present

## 2017-04-18 DIAGNOSIS — Z720 Tobacco use: Secondary | ICD-10-CM

## 2017-04-18 DIAGNOSIS — J42 Unspecified chronic bronchitis: Secondary | ICD-10-CM

## 2017-04-18 DIAGNOSIS — J432 Centrilobular emphysema: Secondary | ICD-10-CM

## 2017-04-18 DIAGNOSIS — F411 Generalized anxiety disorder: Secondary | ICD-10-CM | POA: Diagnosis not present

## 2017-04-18 DIAGNOSIS — F331 Major depressive disorder, recurrent, moderate: Secondary | ICD-10-CM

## 2017-04-18 DIAGNOSIS — F1011 Alcohol abuse, in remission: Secondary | ICD-10-CM | POA: Diagnosis not present

## 2017-04-18 DIAGNOSIS — M797 Fibromyalgia: Secondary | ICD-10-CM | POA: Diagnosis not present

## 2017-04-18 NOTE — Patient Instructions (Signed)

## 2017-04-21 ENCOUNTER — Other Ambulatory Visit: Payer: Self-pay | Admitting: Physician Assistant

## 2017-04-21 DIAGNOSIS — J449 Chronic obstructive pulmonary disease, unspecified: Secondary | ICD-10-CM

## 2017-04-21 DIAGNOSIS — I10 Essential (primary) hypertension: Secondary | ICD-10-CM

## 2017-04-21 MED ORDER — THEOPHYLLINE ER 300 MG PO CP24: 300.0000 mg | ORAL_CAPSULE | Freq: Every day | ORAL | 1 refills | Status: DC

## 2017-04-21 MED ORDER — LOSARTAN POTASSIUM 50 MG PO TABS: 50.0000 mg | ORAL_TABLET | Freq: Every day | ORAL | 1 refills | Status: DC

## 2017-04-21 NOTE — Telephone Encounter (Addendum)
[facility] faxed a refill request on the following medications:  theophylline (THEO-24) 300 MG 24 hr capsule. Take 1 capsule by mouth once daily.    losartan (COZAAR) 50 MG tablet.  Take 1 tablet by mouth once daily.  90 day supply    [facility]

## 2017-05-08 ENCOUNTER — Telehealth: Payer: Self-pay | Admitting: *Deleted

## 2017-05-08 DIAGNOSIS — Z122 Encounter for screening for malignant neoplasm of respiratory organs: Secondary | ICD-10-CM

## 2017-05-08 DIAGNOSIS — Z87891 Personal history of nicotine dependence: Secondary | ICD-10-CM

## 2017-05-08 NOTE — Telephone Encounter (Signed)
Notified patient that annual lung cancer screening low dose CT scan is due currently or will be in near future. Confirmed that patient is within the age range of 55-77, and asymptomatic, (no signs or symptoms of lung cancer). Patient denies illness that would prevent curative treatment for lung cancer if found. Verified smoking history, (current, 46 pack year). The shared decision making visit was done 05/03/16. Patient is agreeable for CT scan being scheduled.

## 2017-05-09 ENCOUNTER — Telehealth: Payer: Self-pay | Admitting: *Deleted

## 2017-05-09 NOTE — Telephone Encounter (Signed)
CT appt confirmed with patient. °

## 2017-05-10 ENCOUNTER — Encounter: Payer: Self-pay | Admitting: Physician Assistant

## 2017-05-10 ENCOUNTER — Ambulatory Visit (INDEPENDENT_AMBULATORY_CARE_PROVIDER_SITE_OTHER): Payer: BLUE CROSS/BLUE SHIELD | Admitting: Physician Assistant

## 2017-05-10 VITALS — BP 126/70 | HR 84 | Temp 98.2°F | Resp 16 | Ht 71.0 in | Wt 227.0 lb

## 2017-05-10 DIAGNOSIS — Z124 Encounter for screening for malignant neoplasm of cervix: Secondary | ICD-10-CM | POA: Diagnosis not present

## 2017-05-10 DIAGNOSIS — Z13220 Encounter for screening for lipoid disorders: Secondary | ICD-10-CM

## 2017-05-10 DIAGNOSIS — E039 Hypothyroidism, unspecified: Secondary | ICD-10-CM

## 2017-05-10 DIAGNOSIS — J42 Unspecified chronic bronchitis: Secondary | ICD-10-CM | POA: Diagnosis not present

## 2017-05-10 DIAGNOSIS — Z1239 Encounter for other screening for malignant neoplasm of breast: Secondary | ICD-10-CM

## 2017-05-10 DIAGNOSIS — Z131 Encounter for screening for diabetes mellitus: Secondary | ICD-10-CM

## 2017-05-10 DIAGNOSIS — Z1211 Encounter for screening for malignant neoplasm of colon: Secondary | ICD-10-CM | POA: Diagnosis not present

## 2017-05-10 DIAGNOSIS — F17209 Nicotine dependence, unspecified, with unspecified nicotine-induced disorders: Secondary | ICD-10-CM | POA: Diagnosis not present

## 2017-05-10 DIAGNOSIS — Z1231 Encounter for screening mammogram for malignant neoplasm of breast: Secondary | ICD-10-CM | POA: Diagnosis not present

## 2017-05-10 DIAGNOSIS — I10 Essential (primary) hypertension: Secondary | ICD-10-CM

## 2017-05-10 DIAGNOSIS — Z0000 Encounter for general adult medical examination without abnormal findings: Secondary | ICD-10-CM

## 2017-05-10 DIAGNOSIS — Z87891 Personal history of nicotine dependence: Secondary | ICD-10-CM | POA: Diagnosis not present

## 2017-05-10 DIAGNOSIS — F331 Major depressive disorder, recurrent, moderate: Secondary | ICD-10-CM | POA: Diagnosis not present

## 2017-05-10 DIAGNOSIS — Z23 Encounter for immunization: Secondary | ICD-10-CM | POA: Diagnosis not present

## 2017-05-10 DIAGNOSIS — Z1159 Encounter for screening for other viral diseases: Secondary | ICD-10-CM

## 2017-05-10 DIAGNOSIS — E559 Vitamin D deficiency, unspecified: Secondary | ICD-10-CM

## 2017-05-10 DIAGNOSIS — Z136 Encounter for screening for cardiovascular disorders: Secondary | ICD-10-CM

## 2017-05-10 DIAGNOSIS — Z114 Encounter for screening for human immunodeficiency virus [HIV]: Secondary | ICD-10-CM

## 2017-05-10 NOTE — Patient Instructions (Signed)
Health Maintenance for Postmenopausal Women Menopause is a normal process in which your reproductive ability comes to an end. This process happens gradually over a span of months to years, usually between the ages of 22 and 9. Menopause is complete when you have missed 12 consecutive menstrual periods. It is important to talk with your health care provider about some of the most common conditions that affect postmenopausal women, such as heart disease, cancer, and bone loss (osteoporosis). Adopting a healthy lifestyle and getting preventive care can help to promote your health and wellness. Those actions can also lower your chances of developing some of these common conditions. What should I know about menopause? During menopause, you may experience a number of symptoms, such as:  Moderate-to-severe hot flashes.  Night sweats.  Decrease in sex drive.  Mood swings.  Headaches.  Tiredness.  Irritability.  Memory problems.  Insomnia.  Choosing to treat or not to treat menopausal changes is an individual decision that you make with your health care provider. What should I know about hormone replacement therapy and supplements? Hormone therapy products are effective for treating symptoms that are associated with menopause, such as hot flashes and night sweats. Hormone replacement carries certain risks, especially as you become older. If you are thinking about using estrogen or estrogen with progestin treatments, discuss the benefits and risks with your health care provider. What should I know about heart disease and stroke? Heart disease, heart attack, and stroke become more likely as you age. This may be due, in part, to the hormonal changes that your body experiences during menopause. These can affect how your body processes dietary fats, triglycerides, and cholesterol. Heart attack and stroke are both medical emergencies. There are many things that you can do to help prevent heart disease  and stroke:  Have your blood pressure checked at least every 1-2 years. High blood pressure causes heart disease and increases the risk of stroke.  If you are 53-22 years old, ask your health care provider if you should take aspirin to prevent a heart attack or a stroke.  Do not use any tobacco products, including cigarettes, chewing tobacco, or electronic cigarettes. If you need help quitting, ask your health care provider.  It is important to eat a healthy diet and maintain a healthy weight. ? Be sure to include plenty of vegetables, fruits, low-fat dairy products, and lean protein. ? Avoid eating foods that are high in solid fats, added sugars, or salt (sodium).  Get regular exercise. This is one of the most important things that you can do for your health. ? Try to exercise for at least 150 minutes each week. The type of exercise that you do should increase your heart rate and make you sweat. This is known as moderate-intensity exercise. ? Try to do strengthening exercises at least twice each week. Do these in addition to the moderate-intensity exercise.  Know your numbers.Ask your health care provider to check your cholesterol and your blood glucose. Continue to have your blood tested as directed by your health care provider.  What should I know about cancer screening? There are several types of cancer. Take the following steps to reduce your risk and to catch any cancer development as early as possible. Breast Cancer  Practice breast self-awareness. ? This means understanding how your breasts normally appear and feel. ? It also means doing regular breast self-exams. Let your health care provider know about any changes, no matter how small.  If you are 40  or older, have a clinician do a breast exam (clinical breast exam or CBE) every year. Depending on your age, family history, and medical history, it may be recommended that you also have a yearly breast X-ray (mammogram).  If you  have a family history of breast cancer, talk with your health care provider about genetic screening.  If you are at high risk for breast cancer, talk with your health care provider about having an MRI and a mammogram every year.  Breast cancer (BRCA) gene test is recommended for women who have family members with BRCA-related cancers. Results of the assessment will determine the need for genetic counseling and BRCA1 and for BRCA2 testing. BRCA-related cancers include these types: ? Breast. This occurs in males or females. ? Ovarian. ? Tubal. This may also be called fallopian tube cancer. ? Cancer of the abdominal or pelvic lining (peritoneal cancer). ? Prostate. ? Pancreatic.  Cervical, Uterine, and Ovarian Cancer Your health care provider may recommend that you be screened regularly for cancer of the pelvic organs. These include your ovaries, uterus, and vagina. This screening involves a pelvic exam, which includes checking for microscopic changes to the surface of your cervix (Pap test).  For women ages 21-65, health care providers may recommend a pelvic exam and a Pap test every three years. For women ages 79-65, they may recommend the Pap test and pelvic exam, combined with testing for human papilloma virus (HPV), every five years. Some types of HPV increase your risk of cervical cancer. Testing for HPV may also be done on women of any age who have unclear Pap test results.  Other health care providers may not recommend any screening for nonpregnant women who are considered low risk for pelvic cancer and have no symptoms. Ask your health care provider if a screening pelvic exam is right for you.  If you have had past treatment for cervical cancer or a condition that could lead to cancer, you need Pap tests and screening for cancer for at least 20 years after your treatment. If Pap tests have been discontinued for you, your risk factors (such as having a new sexual partner) need to be  reassessed to determine if you should start having screenings again. Some women have medical problems that increase the chance of getting cervical cancer. In these cases, your health care provider may recommend that you have screening and Pap tests more often.  If you have a family history of uterine cancer or ovarian cancer, talk with your health care provider about genetic screening.  If you have vaginal bleeding after reaching menopause, tell your health care provider.  There are currently no reliable tests available to screen for ovarian cancer.  Lung Cancer Lung cancer screening is recommended for adults 69-62 years old who are at high risk for lung cancer because of a history of smoking. A yearly low-dose CT scan of the lungs is recommended if you:  Currently smoke.  Have a history of at least 30 pack-years of smoking and you currently smoke or have quit within the past 15 years. A pack-year is smoking an average of one pack of cigarettes per day for one year.  Yearly screening should:  Continue until it has been 15 years since you quit.  Stop if you develop a health problem that would prevent you from having lung cancer treatment.  Colorectal Cancer  This type of cancer can be detected and can often be prevented.  Routine colorectal cancer screening usually begins at  age 42 and continues through age 45.  If you have risk factors for colon cancer, your health care provider may recommend that you be screened at an earlier age.  If you have a family history of colorectal cancer, talk with your health care provider about genetic screening.  Your health care provider may also recommend using home test kits to check for hidden blood in your stool.  A small camera at the end of a tube can be used to examine your colon directly (sigmoidoscopy or colonoscopy). This is done to check for the earliest forms of colorectal cancer.  Direct examination of the colon should be repeated every  5-10 years until age 71. However, if early forms of precancerous polyps or small growths are found or if you have a family history or genetic risk for colorectal cancer, you may need to be screened more often.  Skin Cancer  Check your skin from head to toe regularly.  Monitor any moles. Be sure to tell your health care provider: ? About any new moles or changes in moles, especially if there is a change in a mole's shape or color. ? If you have a mole that is larger than the size of a pencil eraser.  If any of your family members has a history of skin cancer, especially at a young age, talk with your health care provider about genetic screening.  Always use sunscreen. Apply sunscreen liberally and repeatedly throughout the day.  Whenever you are outside, protect yourself by wearing long sleeves, pants, a wide-brimmed hat, and sunglasses.  What should I know about osteoporosis? Osteoporosis is a condition in which bone destruction happens more quickly than new bone creation. After menopause, you may be at an increased risk for osteoporosis. To help prevent osteoporosis or the bone fractures that can happen because of osteoporosis, the following is recommended:  If you are 46-71 years old, get at least 1,000 mg of calcium and at least 600 mg of vitamin D per day.  If you are older than age 55 but younger than age 65, get at least 1,200 mg of calcium and at least 600 mg of vitamin D per day.  If you are older than age 54, get at least 1,200 mg of calcium and at least 800 mg of vitamin D per day.  Smoking and excessive alcohol intake increase the risk of osteoporosis. Eat foods that are rich in calcium and vitamin D, and do weight-bearing exercises several times each week as directed by your health care provider. What should I know about how menopause affects my mental health? Depression may occur at any age, but it is more common as you become older. Common symptoms of depression  include:  Low or sad mood.  Changes in sleep patterns.  Changes in appetite or eating patterns.  Feeling an overall lack of motivation or enjoyment of activities that you previously enjoyed.  Frequent crying spells.  Talk with your health care provider if you think that you are experiencing depression. What should I know about immunizations? It is important that you get and maintain your immunizations. These include:  Tetanus, diphtheria, and pertussis (Tdap) booster vaccine.  Influenza every year before the flu season begins.  Pneumonia vaccine.  Shingles vaccine.  Your health care provider may also recommend other immunizations. This information is not intended to replace advice given to you by your health care provider. Make sure you discuss any questions you have with your health care provider. Document Released: 09/09/2005  Document Revised: 02/05/2016 Document Reviewed: 04/21/2015 Elsevier Interactive Patient Education  2018 [employer]  

## 2017-05-10 NOTE — Progress Notes (Signed)
Patient: Linda Bradford, Female    DOB: 07-06-1956, 61 y.o.   MRN: [id number] Visit Date: 05/10/2017  Today's Provider: Mar Daring, PA-C   Chief Complaint  Patient presents with  . Annual Exam   Subjective:    Annual physical exam Linda Bradford is a 61 y.o. female who presents today for health maintenance and complete physical. She feels well. She reports exercising 2-3 days. She reports she is sleeping well. -----------------------------------------------------------------   Review of Systems  Constitutional: Negative.   HENT: Negative.   Eyes: Negative.   Respiratory: Negative.   Cardiovascular: Negative.   Gastrointestinal: Negative.   Endocrine: Negative.   Genitourinary: Negative.   Musculoskeletal: Negative.   Skin: Negative.   Allergic/Immunologic: Negative.   Neurological: Negative.   Hematological: Negative.   Psychiatric/Behavioral: Negative.     Social History      She  reports that she has been smoking Cigarettes.  She started smoking about 37 years ago. She has a 45.00 pack-year smoking history. She has never used smokeless tobacco. She reports that she does not drink alcohol or use drugs.       Social History   Social History  . Marital status: Married    Spouse name: N/A  . Number of children: N/A  . Years of education: N/A   Social History Main Topics  . Smoking status: Current Every Day Smoker    Packs/day: 1.00    Years: 45.00    Types: Cigarettes    Start date: 03/02/1980  . Smokeless tobacco: Never Used     Comment: every other day  . Alcohol use No  . Drug use: No  . Sexual activity: Yes    Birth control/ protection: None   Other Topics Concern  . None   Social History Narrative  . None    Past Medical History:  Diagnosis Date  . Anxiety   . Asthma   . Depression   . Fatigue   . Headache   . Hypertension   . Thyroid disease      Patient Active Problem List   Diagnosis Date Noted  . Personal history of  tobacco use, presenting hazards to health 05/03/2016  . Bronchitis, chronic ([place]) 03/03/2015  . Depression, major, recurrent, moderate ([place]) 11/05/2014  . Anxiety, generalized 11/05/2014  . Alcohol abuse, in remission 11/05/2014  . Nicotine addiction 11/05/2014  . Restless leg 11/05/2014  . H/O bronchitis 11/05/2014  . H/O: osteoarthritis 11/05/2014  . Fibromyalgia 11/05/2014  . H/O: hypothyroidism 11/05/2014  . Restless leg syndrome 11/05/2014  . Arthralgia of hip 01/13/2014  . Ache in joint 01/13/2014  . Gonalgia 01/13/2014  . Adiposity 01/13/2014  . Disordered sleep 01/13/2014  . Airway hyperreactivity 12/17/2013  . Clinical depression 12/17/2013  . Adult hypothyroidism 12/17/2013  . Compulsive tobacco user syndrome 12/17/2013  . Current tobacco use 12/17/2013  . Basal cell carcinoma 11/04/2013  . Arthropathia 02/08/2012    Past Surgical History:  Procedure Laterality Date  . APPENDECTOMY    . TUBAL LIGATION      Family History        Family Status  Relation Status  . Mother Deceased  . Sister Alive  . Brother Deceased at age 62  . Mat Aunt Deceased  . Son Alive  . Son Alive  . Father Deceased at age 42  . Sister Alive  . Brother Deceased  . Mat Uncle (Not Specified)  . MGM (Not Specified)  Her family history includes Alcohol abuse in her brother, maternal grandmother, maternal uncle, sister, and son; Arthritis in her sister; Breast cancer in her brother; Diabetes in her sister; Drug abuse in her son and son; Heart attack in her brother; Heart disease (age of onset: 78) in her father; Heart disease (age of onset: 84) in her brother; Lung cancer in her mother; Pancreatic cancer in her maternal aunt.     Allergies  Allergen Reactions  . Dexilant [Dexlansoprazole] Nausea Only and Other (See Comments)    Dizziness     Current Outpatient Prescriptions:  .  acetaminophen (TYLENOL) 500 MG tablet, Take 1,000 mg by mouth every 6 (six) hours as needed., Disp:  , Rfl:  .  albuterol (PROVENTIL HFA;VENTOLIN HFA) 108 (90 Base) MCG/ACT inhaler, Inhale 1 puff into the lungs every 4 (four) hours as needed., Disp: 1 Inhaler, Rfl: 11 .  benzonatate (TESSALON) 200 MG capsule, Take 1 capsule (200 mg total) by mouth 3 (three) times daily as needed for cough., Disp: 90 capsule, Rfl: 3 .  clonazePAM (KLONOPIN) 0.5 MG tablet, Take 0.5 tablets (0.25 mg total) by mouth 2 (two) times daily as needed for anxiety. If 1/2 tablet is not effective can take one whole tablet twice daily., Disp: 60 tablet, Rfl: 5 .  Cyanocobalamin (RA VITAMIN B-12 TR) 1000 MCG TBCR, Take by mouth., Disp: , Rfl:  .  diclofenac sodium (VOLTAREN) 1 % GEL, Apply topically., Disp: , Rfl:  .  DULoxetine (CYMBALTA) 60 MG capsule, Take 1 capsule (60 mg total) by mouth 2 (two) times daily., Disp: 60 capsule, Rfl: 6 .  fexofenadine (ALLEGRA) 180 MG tablet, Take 1 tablet (180 mg total) by mouth daily., Disp: 90 tablet, Rfl: 1 .  fluconazole (DIFLUCAN) 150 MG tablet, Take 1 tablet (150 mg total) by mouth every 3 (three) days as needed., Disp: 2 tablet, Rfl: 0 .  fluticasone (FLONASE) 50 MCG/ACT nasal spray, Place 2 sprays into both nostrils daily., Disp: 48 g, Rfl: 1 .  fluticasone furoate-vilanterol (BREO ELLIPTA) 200-25 MCG/INH AEPB, Inhale 1 puff into the lungs daily., Disp: 60 each, Rfl: 5 .  gabapentin (NEURONTIN) 300 MG capsule, Take 4 capsules (1,200 mg total) by mouth at bedtime., Disp: 120 capsule, Rfl: 5 .  ipratropium-albuterol (DUONEB) 0.5-2.5 (3) MG/3ML SOLN, Take 3 mLs by nebulization every 6 (six) hours as needed., Disp: 360 mL, Rfl: 11 .  levothyroxine (SYNTHROID, LEVOTHROID) 75 MCG tablet, Take 1 tablet (75 mcg total) by mouth daily., Disp: 90 tablet, Rfl: 1 .  losartan (COZAAR) 50 MG tablet, Take 1 tablet (50 mg total) by mouth daily., Disp: 90 tablet, Rfl: 1 .  nabumetone (RELAFEN) 500 MG tablet, Take 500 mg by mouth 2 (two) times daily., Disp: , Rfl: 1 .  nystatin (MYCOSTATIN) 100000  UNIT/ML suspension, Take 1 teaspoon PO every 3 days prn. Swish and spit, Disp: 60 mL, Rfl: 3 .  theophylline (THEO-24) 300 MG 24 hr capsule, Take 1 capsule (300 mg total) by mouth daily., Disp: 90 capsule, Rfl: 1 .  tiZANidine (ZANAFLEX) 4 MG capsule, Take 1 capsule (4 mg total) by mouth 3 (three) times daily., Disp: 90 capsule, Rfl: 6 .  Vitamin D, Ergocalciferol, (DRISDOL) 50000 units CAPS capsule, Take 1 capsule (50,000 Units total) by mouth every 7 (seven) days., Disp: 12 capsule, Rfl: 3 .  aspirin 81 MG tablet, Take 81 mg by mouth., Disp: , Rfl:  .  tiotropium (SPIRIVA) 18 MCG inhalation capsule, Place 1 capsule (18 mcg total) into  inhaler and inhale daily. (Patient not taking: Reported on 05/10/2017), Disp: 30 capsule, Rfl: 11   Patient Care Team: Mar Daring, PA-C as PCP - General (Family Medicine)      Objective:   Vitals: BP 126/70 (BP Location: Left Arm, Patient Position: Sitting, Cuff Size: Large)   Pulse 84   Temp 98.2 F (36.8 C) (Oral)   Resp 16   Ht 5\' 11"  (1.803 m)   Wt 227 lb (103 kg)   BMI 31.66 kg/m    Vitals:   05/10/17 1520  BP: 126/70  Pulse: 84  Resp: 16  Temp: 98.2 F (36.8 C)  TempSrc: Oral  Weight: 227 lb (103 kg)  Height: 5\' 11"  (1.803 m)     Physical Exam  Constitutional: She is oriented to person, place, and time. She appears well-developed and well-nourished. No distress.  HENT:  Head: Normocephalic and atraumatic.  Right Ear: Hearing, tympanic membrane, external ear and ear canal normal.  Left Ear: Hearing, tympanic membrane, external ear and ear canal normal.  Nose: Nose normal.  Mouth/Throat: Uvula is midline, oropharynx is clear and moist and mucous membranes are normal. No oropharyngeal exudate.  Eyes: Pupils are equal, round, and reactive to light. Conjunctivae and EOM are normal. Right eye exhibits no discharge. Left eye exhibits no discharge. No scleral icterus.  Neck: Normal range of motion. Neck supple. No JVD present.  Carotid bruit is not present. No tracheal deviation present. No thyromegaly present.  Cardiovascular: Normal rate, regular rhythm, normal heart sounds and intact distal pulses.  Exam reveals no gallop and no friction rub.   No murmur heard. Pulmonary/Chest: Effort normal and breath sounds normal. No respiratory distress. She has no wheezes. She has no rales. She exhibits no tenderness. Right breast exhibits no inverted nipple, no mass, no nipple discharge, no skin change and no tenderness. Left breast exhibits no inverted nipple, no mass, no nipple discharge, no skin change and no tenderness. Breasts are symmetrical.  Abdominal: Soft. Bowel sounds are normal. She exhibits no distension and no mass. There is no tenderness. There is no rebound and no guarding. Hernia confirmed negative in the right inguinal area and confirmed negative in the left inguinal area.  Genitourinary: Rectum normal, vagina normal and uterus normal. No breast swelling, tenderness, discharge or bleeding. Pelvic exam was performed with patient supine. There is no rash, tenderness, lesion or injury on the right labia. There is no rash, tenderness, lesion or injury on the left labia. Cervix exhibits no motion tenderness, no discharge and no friability. Right adnexum displays no mass, no tenderness and no fullness. Left adnexum displays no mass, no tenderness and no fullness. No erythema, tenderness or bleeding in the vagina. No signs of injury around the vagina. No vaginal discharge found.  Musculoskeletal: Normal range of motion. She exhibits no edema or tenderness.  Lymphadenopathy:    She has no cervical adenopathy.       Right: No inguinal adenopathy present.       Left: No inguinal adenopathy present.  Neurological: She is alert and oriented to person, place, and time. She has normal reflexes. No cranial nerve deficit. Coordination normal.  Skin: Skin is warm and dry. No rash noted. She is not diaphoretic.  Psychiatric: She has a  normal mood and affect. Her behavior is normal. Judgment and thought content normal.  Vitals reviewed.    Depression Screen PHQ 2/9 Scores 05/10/2017 10/10/2016  PHQ - 2 Score 0 4  PHQ- 9 Score 0 12  Assessment & Plan:     Routine Health Maintenance and Physical Exam  Exercise Activities and Dietary recommendations Goals    . Quit smoking / using tobacco        There is no immunization history on file for this patient.  Health Maintenance  Topic Date Due  . Hepatitis C Screening  October 18, 1955  . HIV Screening  01/04/1971  . TETANUS/TDAP  01/04/1975  . PAP SMEAR  01/03/1977  . COLONOSCOPY  01/03/2006  . MAMMOGRAM  10/12/2016  . INFLUENZA VACCINE  03/01/2017     Discussed health benefits of physical activity, and encouraged her to engage in regular exercise appropriate for her age and condition.   1. Annual physical exam Normal physical exam today. Will check labs as below and f/u pending lab results. If labs are stable and WNL she will not need to have these rechecked for one year at her next annual physical exam. She is to call the office in the meantime if she has any acute issue, questions or concerns. - CBC w/Diff/Platelet - COMPLETE METABOLIC PANEL WITH GFR  2. Breast cancer screening Breast exam today was normal. There is no family history of breast cancer. She does perform regular self breast exams. Mammogram was ordered as below. Information for [facility] clinic was given to patient so she may schedule her mammogram at her convenience. - MM Digital Screening; Future  3. Cervical cancer screening Pap collected today. Will send as below and f/u pending results. - Pap IG and HPV (high risk) DNA detection  4. Colon cancer screening Patient reports never having polyps and no family history of colon cancer. Prefers cologuard thus ordered as below.  - Cologuard  5. Essential hypertension Stable. Continue Losartan 50mg  daily. Will check labs as below  and f/u pending results.  - CBC w/Diff/Platelet - COMPLETE METABOLIC PANEL WITH GFR - Lipid Profile - HgB A1c  6. Depression, major, recurrent, moderate (HCC) Stable. Continue duloxetine 60mg  BID.  7. Tobacco use disorder, continuous Patient has no desire to quit at this time.   8. Vitamin D deficiency On high dose Vit D supplement. Will check labs as below and f/u pending results. - CBC w/Diff/Platelet - COMPLETE METABOLIC PANEL WITH GFR - Vitamin D (25 hydroxy)  9. Chronic bronchitis, unspecified chronic bronchitis type (HCC) Stable on Spiriva, theophylline, Duoneb nebulizer, Breo, and albuterol inhaler prn.  - COMPLETE METABOLIC PANEL WITH GFR  10. Adult hypothyroidism Stable on levothyroxine 36mcg. Will check labs as below and f/u pending results. - TSH  11. Personal history of tobacco use, presenting hazards to health No desire to quit.  12. Diabetes mellitus screening Will check labs as below and f/u pending results. - HgB A1c  13. Encounter for lipid screening for cardiovascular disease Will check labs as below and f/u pending results. - Lipid Profile  14. Screening for HIV without presence of risk factors - HIV antibody (with reflex)  15. Need for hepatitis C screening test - Hepatitis C Antibody  16. Need for influenza vaccination Flu vaccine given today without complication. Patient sat upright for 15 minutes to check for adverse reaction before being released. - Flu Vaccine QUAD 6+ mos PF IM (Fluarix Quad PF)   --------------------------------------------------------------------    Mar Daring, PA-C  [facility]

## 2017-05-12 LAB — PAP IG AND HPV HIGH-RISK: HPV DNA High Risk: NOT DETECTED

## 2017-05-16 ENCOUNTER — Ambulatory Visit
Admission: RE | Admit: 2017-05-16 | Discharge: 2017-05-16 | Disposition: A | Discharge status: Home / Self Care | Payer: BLUE CROSS/BLUE SHIELD | Source: Ambulatory Visit | Attending: Oncology | Admitting: Oncology

## 2017-05-16 DIAGNOSIS — J432 Centrilobular emphysema: Secondary | ICD-10-CM | POA: Insufficient documentation

## 2017-05-16 DIAGNOSIS — Z87891 Personal history of nicotine dependence: Secondary | ICD-10-CM

## 2017-05-16 DIAGNOSIS — Z122 Encounter for screening for malignant neoplasm of respiratory organs: Secondary | ICD-10-CM

## 2017-05-16 DIAGNOSIS — F17210 Nicotine dependence, cigarettes, uncomplicated: Secondary | ICD-10-CM | POA: Diagnosis not present

## 2017-05-16 DIAGNOSIS — I700 Atherosclerosis of aorta: Secondary | ICD-10-CM | POA: Insufficient documentation

## 2017-05-19 ENCOUNTER — Other Ambulatory Visit: Payer: Self-pay | Admitting: Physician Assistant

## 2017-05-19 DIAGNOSIS — Z136 Encounter for screening for cardiovascular disorders: Secondary | ICD-10-CM | POA: Diagnosis not present

## 2017-05-19 DIAGNOSIS — E559 Vitamin D deficiency, unspecified: Secondary | ICD-10-CM | POA: Diagnosis not present

## 2017-05-19 DIAGNOSIS — G2581 Restless legs syndrome: Secondary | ICD-10-CM

## 2017-05-19 DIAGNOSIS — J418 Mixed simple and mucopurulent chronic bronchitis: Secondary | ICD-10-CM

## 2017-05-19 DIAGNOSIS — Z0000 Encounter for general adult medical examination without abnormal findings: Secondary | ICD-10-CM | POA: Diagnosis not present

## 2017-05-19 DIAGNOSIS — I10 Essential (primary) hypertension: Secondary | ICD-10-CM | POA: Diagnosis not present

## 2017-05-19 DIAGNOSIS — E039 Hypothyroidism, unspecified: Secondary | ICD-10-CM | POA: Diagnosis not present

## 2017-05-19 MED ORDER — FLUTICASONE FUROATE-VILANTEROL 200-25 MCG/INH IN AEPB: 1.0000 | INHALATION_SPRAY | Freq: Every day | RESPIRATORY_TRACT | 5 refills | Status: DC

## 2017-05-19 MED ORDER — GABAPENTIN 300 MG PO CAPS: 1200.0000 mg | ORAL_CAPSULE | Freq: Every day | ORAL | 5 refills | Status: DC

## 2017-05-19 NOTE — Telephone Encounter (Signed)
[facility] faxed a refill request on the following medications:    gabapentin (NEURONTIN) 300 MG capsule.  Take 4 capsules by mouth at bedtime.  [facility]

## 2017-05-19 NOTE — Telephone Encounter (Signed)
Linda Bradford faxed a refill request on the following medications:  fluticasone furoate-vilanterol (BREO ELLIPTA) 200-25 MCG/INH AEPB   Linda Bradford/MW

## 2017-05-20 LAB — HEPATITIS C ANTIBODY
Hepatitis C Ab: NONREACTIVE
SIGNAL TO CUT-OFF: 0.01 (ref <1.00)

## 2017-05-20 LAB — HEMOGLOBIN A1C
Hgb A1c MFr Bld: 7.5 % of total Hgb — ABNORMAL HIGH (ref <5.7)
Mean Plasma Glucose: 169 (calc)
eAG (mmol/L): 9.3 (calc)

## 2017-05-20 LAB — COMPLETE METABOLIC PANEL WITH GFR
AG Ratio: 1.5 (calc) (ref 1.0–2.5)
ALT: 13 U/L (ref 6–29)
AST: 13 U/L (ref 10–35)
Albumin: 4.3 g/dL (ref 3.6–5.1)
Alkaline phosphatase (APISO): 105 U/L (ref 33–130)
BUN: 12 mg/dL (ref 7–25)
CO2: 27 mmol/L (ref 20–32)
Calcium: 9.7 mg/dL (ref 8.6–10.4)
Chloride: 100 mmol/L (ref 98–110)
Creat: 0.58 mg/dL (ref 0.50–0.99)
GFR, Est African American: 115 mL/min/{1.73_m2} (ref >=60)
GFR, Est Non African American: 99 mL/min/{1.73_m2} (ref >=60)
Globulin: 2.8 g/dL (calc) (ref 1.9–3.7)
Glucose, Bld: 164 mg/dL — ABNORMAL HIGH (ref 65–99)
Potassium: 4.4 mmol/L (ref 3.5–5.3)
Sodium: 138 mmol/L (ref 135–146)
Total Bilirubin: 0.5 mg/dL (ref 0.2–1.2)
Total Protein: 7.1 g/dL (ref 6.1–8.1)

## 2017-05-20 LAB — CBC WITH DIFFERENTIAL/PLATELET
Basophils Absolute: 62 cells/uL (ref 0–200)
Basophils Relative: 0.8 %
Eosinophils Absolute: 200 cells/uL (ref 15–500)
Eosinophils Relative: 2.6 %
HCT: 43 % (ref 35.0–45.0)
Hemoglobin: 14.9 g/dL (ref 11.7–15.5)
Lymphs Abs: 2510 cells/uL (ref 850–3900)
MCH: 31.2 pg (ref 27.0–33.0)
MCHC: 34.7 g/dL (ref 32.0–36.0)
MCV: 90.1 fL (ref 80.0–100.0)
MPV: 9.5 fL (ref 7.5–12.5)
Monocytes Relative: 6.6 %
Neutro Abs: 4420 cells/uL (ref 1500–7800)
Neutrophils Relative %: 57.4 %
Platelets: 317 10*3/uL (ref 140–400)
RBC: 4.77 10*6/uL (ref 3.80–5.10)
RDW: 13.5 % (ref 11.0–15.0)
Total Lymphocyte: 32.6 %
WBC mixed population: 508 cells/uL (ref 200–950)
WBC: 7.7 10*3/uL (ref 3.8–10.8)

## 2017-05-20 LAB — VITAMIN D 25 HYDROXY (VIT D DEFICIENCY, FRACTURES): Vit D, 25-Hydroxy: 18 ng/mL — ABNORMAL LOW (ref 30–100)

## 2017-05-20 LAB — LIPID PANEL
Cholesterol: 265 mg/dL — ABNORMAL HIGH (ref <200)
HDL: 62 mg/dL (ref >50)
LDL Cholesterol (Calc): 167 mg/dL (calc) — ABNORMAL HIGH
Non-HDL Cholesterol (Calc): 203 mg/dL (calc) — ABNORMAL HIGH (ref <130)
Total CHOL/HDL Ratio: 4.3 (calc) (ref <5.0)
Triglycerides: 204 mg/dL — ABNORMAL HIGH (ref <150)

## 2017-05-20 LAB — HIV ANTIBODY (ROUTINE TESTING W REFLEX): HIV 1&2 Ab, 4th Generation: NONREACTIVE

## 2017-05-20 LAB — TSH: TSH: 2.16 mIU/L (ref 0.40–4.50)

## 2017-05-22 ENCOUNTER — Telehealth: Payer: Self-pay

## 2017-05-22 ENCOUNTER — Telehealth: Payer: Self-pay | Admitting: Physician Assistant

## 2017-05-22 ENCOUNTER — Encounter: Payer: Self-pay | Admitting: *Deleted

## 2017-05-22 DIAGNOSIS — E7800 Pure hypercholesterolemia, unspecified: Secondary | ICD-10-CM

## 2017-05-22 DIAGNOSIS — E119 Type 2 diabetes mellitus without complications: Secondary | ICD-10-CM

## 2017-05-22 MED ORDER — EZETIMIBE 10 MG PO TABS: 10.0000 mg | ORAL_TABLET | Freq: Every day | ORAL | 1 refills | Status: DC

## 2017-05-22 MED ORDER — METFORMIN HCL 500 MG PO TABS: 500.0000 mg | ORAL_TABLET | Freq: Two times a day (BID) | ORAL | 1 refills | Status: DC

## 2017-05-22 NOTE — Telephone Encounter (Signed)
Order for cologuard faxed to [facility] °

## 2017-05-22 NOTE — Telephone Encounter (Signed)
-----   Message from Mar Daring, [place] sent at 05/22/2017 10:10 AM EDT ----- Vit D better but still low at 18. Continue Vit D supplementation. A1c up to 7.5 indicating diabetes. Would recommend starting metformin 500mg  twice daily and scheduling a visit with me to recheck and discuss in 3 months. Limit carbohydrates and sugars from diet. Cholesterol elevated and with smoking and new onset diabetes your 10 yr ASCVD risk is up to 20.03%. This means you have a 20% chance to have a cardiovascular event in the next 10 years. At this level it is recommended to start a low dose cholesterol lowering medication. I will send both of these medications in to your pharmacy. If you have further questions please message me or come in for an OV before 3 months.

## 2017-05-22 NOTE — Telephone Encounter (Signed)
Patient advised as directed below. Per patient she has tried Crestor, Zocor, Lipitor and they all made her feel really bad and have extra pain. She wants to know if Zeta can be send to the pharmacy for her. She also wanted to come and see you sooner to over the labs. Scheduled her for Thursday @4pm  for a 15 minute appointment.  Thanks,  -Carley Glendenning

## 2017-05-22 NOTE — Telephone Encounter (Signed)
Zetia and metformin sent to [facility]. I will see her Thursday.

## 2017-05-24 ENCOUNTER — Telehealth: Payer: Self-pay | Admitting: Physician Assistant

## 2017-05-24 NOTE — Telephone Encounter (Signed)
Patient advised as below. Patient verbalizes understanding and is in agreement with treatment plan.  

## 2017-05-24 NOTE — Telephone Encounter (Signed)
Go to once daily metformin for now to see if this helps.

## 2017-05-24 NOTE — Telephone Encounter (Signed)
Pt states she stated a new medication for diabetes and she is having nausea.  Pt states she is taking an over the counter nausea medication but it is not working.  [facility].  MK#[phone]/[id number]

## 2017-05-25 ENCOUNTER — Encounter: Payer: Self-pay | Admitting: Physician Assistant

## 2017-05-25 ENCOUNTER — Ambulatory Visit (INDEPENDENT_AMBULATORY_CARE_PROVIDER_SITE_OTHER): Payer: BLUE CROSS/BLUE SHIELD | Admitting: Physician Assistant

## 2017-05-25 VITALS — BP 130/80 | HR 74 | Temp 98.5°F | Resp 16 | Wt 226.0 lb

## 2017-05-25 DIAGNOSIS — E119 Type 2 diabetes mellitus without complications: Secondary | ICD-10-CM

## 2017-05-25 NOTE — Patient Instructions (Signed)

## 2017-05-25 NOTE — Progress Notes (Signed)
Patient: Linda Bradford Female    DOB: Apr 13, 1956   61 y.o.   MRN: [id number] Visit Date: 05/25/2017  Today's Provider: Mar Daring, PA-C   Chief Complaint  Patient presents with  . Diabetes   Subjective:    HPI  Follow up for diabetes  The patient was last seen for this 3 weeks ago. Changes made at last visit include start Metformin 500 mg BID.  She reports fair compliance with treatment. She feels that condition is Improved. She is having side effects. Nausea, diarrhea. Patient advised to take Metformin 500 mg once a day. Patient reports symptoms are persistent. She has only been on since Monday. ------------------------------------------------------------------------------------     Allergies  Allergen Reactions  . Dexilant [Dexlansoprazole] Nausea Only and Other (See Comments)    Dizziness     Current Outpatient Prescriptions:  .  acetaminophen (TYLENOL) 500 MG tablet, Take 1,000 mg by mouth every 6 (six) hours as needed., Disp: , Rfl:  .  albuterol (PROVENTIL HFA;VENTOLIN HFA) 108 (90 Base) MCG/ACT inhaler, Inhale 1 puff into the lungs every 4 (four) hours as needed., Disp: 1 Inhaler, Rfl: 11 .  aspirin 81 MG tablet, Take 81 mg by mouth., Disp: , Rfl:  .  benzonatate (TESSALON) 200 MG capsule, Take 1 capsule (200 mg total) by mouth 3 (three) times daily as needed for cough., Disp: 90 capsule, Rfl: 3 .  clonazePAM (KLONOPIN) 0.5 MG tablet, Take 0.5 tablets (0.25 mg total) by mouth 2 (two) times daily as needed for anxiety. If 1/2 tablet is not effective can take one whole tablet twice daily., Disp: 60 tablet, Rfl: 5 .  Cyanocobalamin (RA VITAMIN B-12 TR) 1000 MCG TBCR, Take by mouth., Disp: , Rfl:  .  diclofenac sodium (VOLTAREN) 1 % GEL, Apply topically., Disp: , Rfl:  .  DULoxetine (CYMBALTA) 60 MG capsule, Take 1 capsule (60 mg total) by mouth 2 (two) times daily., Disp: 60 capsule, Rfl: 6 .  ezetimibe (ZETIA) 10 MG tablet, Take 1 tablet (10 mg total) by  mouth daily., Disp: 90 tablet, Rfl: 1 .  fexofenadine (ALLEGRA) 180 MG tablet, Take 1 tablet (180 mg total) by mouth daily., Disp: 90 tablet, Rfl: 1 .  fluconazole (DIFLUCAN) 150 MG tablet, Take 1 tablet (150 mg total) by mouth every 3 (three) days as needed., Disp: 2 tablet, Rfl: 0 .  fluticasone (FLONASE) 50 MCG/ACT nasal spray, Place 2 sprays into both nostrils daily., Disp: 48 g, Rfl: 1 .  fluticasone furoate-vilanterol (BREO ELLIPTA) 200-25 MCG/INH AEPB, Inhale 1 puff into the lungs daily., Disp: 60 each, Rfl: 5 .  gabapentin (NEURONTIN) 300 MG capsule, Take 4 capsules (1,200 mg total) by mouth at bedtime., Disp: 120 capsule, Rfl: 5 .  ipratropium-albuterol (DUONEB) 0.5-2.5 (3) MG/3ML SOLN, Take 3 mLs by nebulization every 6 (six) hours as needed., Disp: 360 mL, Rfl: 11 .  levothyroxine (SYNTHROID, LEVOTHROID) 75 MCG tablet, Take 1 tablet (75 mcg total) by mouth daily., Disp: 90 tablet, Rfl: 1 .  losartan (COZAAR) 50 MG tablet, Take 1 tablet (50 mg total) by mouth daily., Disp: 90 tablet, Rfl: 1 .  metFORMIN (GLUCOPHAGE) 500 MG tablet, Take 1 tablet (500 mg total) by mouth 2 (two) times daily with a meal. (Patient taking differently: Take 500 mg by mouth daily with breakfast. ), Disp: 180 tablet, Rfl: 1 .  nabumetone (RELAFEN) 500 MG tablet, Take 500 mg by mouth 2 (two) times daily., Disp: , Rfl: 1 .  nystatin (  MYCOSTATIN) 100000 UNIT/ML suspension, Take 1 teaspoon PO every 3 days prn. Swish and spit, Disp: 60 mL, Rfl: 3 .  theophylline (THEO-24) 300 MG 24 hr capsule, Take 1 capsule (300 mg total) by mouth daily., Disp: 90 capsule, Rfl: 1 .  tiZANidine (ZANAFLEX) 4 MG capsule, Take 1 capsule (4 mg total) by mouth 3 (three) times daily., Disp: 90 capsule, Rfl: 6 .  Vitamin D, Ergocalciferol, (DRISDOL) 50000 units CAPS capsule, Take 1 capsule (50,000 Units total) by mouth every 7 (seven) days., Disp: 12 capsule, Rfl: 3  Review of Systems  Constitutional: Negative.   HENT: Negative.     Respiratory: Negative.   Cardiovascular: Negative.   Gastrointestinal: Positive for diarrhea and nausea.  Neurological: Negative.     Social History  Substance Use Topics  . Smoking status: Current Every Day Smoker    Packs/day: 1.00    Years: 45.00    Types: Cigarettes    Start date: 03/02/1980  . Smokeless tobacco: Never Used     Comment: every other day  . Alcohol use No   Objective:   BP 130/80 (BP Location: Left Arm, Patient Position: Sitting, Cuff Size: Large)   Pulse 74   Temp 98.5 F (36.9 C) (Oral)   Resp 16   Wt 226 lb (102.5 kg)   SpO2 95%   BMI 31.08 kg/m  Vitals:   05/25/17 1610  BP: 130/80  Pulse: 74  Resp: 16  Temp: 98.5 F (36.9 C)  TempSrc: Oral  SpO2: 95%  Weight: 226 lb (102.5 kg)     Physical Exam  Constitutional: She appears well-developed and well-nourished. No distress.  Neck: Normal range of motion. Neck supple. No tracheal deviation present. No thyromegaly present.  Cardiovascular: Normal rate, regular rhythm and normal heart sounds.  Exam reveals no gallop and no friction rub.   No murmur heard. Pulmonary/Chest: Effort normal and breath sounds normal. No respiratory distress. She has no wheezes. She has no rales.  Abdominal: Soft. Bowel sounds are normal. There is no tenderness.  Lymphadenopathy:    She has no cervical adenopathy.  Skin: She is not diaphoretic.  Vitals reviewed.       Assessment & Plan:     1. Type 2 diabetes mellitus without complication, without long-term current use of insulin ([place]) She will call to give the name of the diabetic meter covered by her insurance so I can send in for her. Continue metformin 500mg  once daily. She is to call next week if still having persistent diarrhea and nausea. If so, I will change to ER. Referral placed to diabetic education. I will see her back in 3 months to recheck.  - Ambulatory referral to diabetic education       Mar Daring, PA-C  [facility]

## 2017-06-01 ENCOUNTER — Encounter: Payer: BLUE CROSS/BLUE SHIELD | Attending: Physician Assistant | Admitting: *Deleted

## 2017-06-01 ENCOUNTER — Encounter: Payer: Self-pay | Admitting: *Deleted

## 2017-06-01 VITALS — BP 124/62 | Ht 71.0 in | Wt 225.5 lb

## 2017-06-01 DIAGNOSIS — Z6831 Body mass index (BMI) 31.0-31.9, adult: Secondary | ICD-10-CM | POA: Diagnosis not present

## 2017-06-01 DIAGNOSIS — E119 Type 2 diabetes mellitus without complications: Secondary | ICD-10-CM | POA: Insufficient documentation

## 2017-06-01 DIAGNOSIS — Z713 Dietary counseling and surveillance: Secondary | ICD-10-CM | POA: Insufficient documentation

## 2017-06-01 NOTE — Patient Instructions (Signed)
Check blood sugars 2 x day before breakfast and 2 hrs after supper every day Bring blood sugar records to the next class  Call your doctor for a prescription for:  1. Meter strips (type) Contour Next  checking  2 times per day  2. Lancets (type) Contour Microlet checking  2      times per day  Exercise: Begin walking    for   10-15  minutes  3 days a week and gradually increase to 150 minutes/week  Eat 3 meals day,   2  snacks a day Space meals 4-6 hours apart Don't skip meals Limit desserts/sweets  Quit smoking  Return for classes on:

## 2017-06-02 NOTE — Progress Notes (Signed)
Diabetes Self-Management Education  Visit Type: First/Initial  Appt. Start Time: 1605 Appt. End Time: 3716  06/01/2017  Linda Bradford, identified by name and date of birth, is a 61 y.o. female with a diagnosis of Diabetes: Type 2.   ASSESSMENT  Blood pressure 124/62, height 5\' 11"  (1.803 m), weight 225 lb 8 oz (102.3 kg). Body mass index is 31.45 kg/m.      Diabetes Self-Management Education - 06/01/17 1804      Visit Information   Visit Type First/Initial     Initial Visit   Diabetes Type Type 2   Are you currently following a meal plan? No   Are you taking your medications as prescribed? Yes   Date Diagnosed October 2018     Health Coping   How would you rate your overall health? Good     Psychosocial Assessment   Patient Belief/Attitude about Diabetes Motivated to manage diabetes   Self-care barriers None   Self-management support Doctor's office   Patient Concerns Nutrition/Meal planning;Medication;Monitoring;Healthy Lifestyle;Problem Solving;Glycemic Control;Weight Control;Support   Special Needs None   Preferred Learning Style Hands on   Learning Readiness Change in progress   How often do you need to have someone help you when you read instructions, pamphlets, or other written materials from your doctor or pharmacy? 1 - Never   What is the last grade level you completed in school? College     Pre-Education Assessment   Patient understands the diabetes disease and treatment process. Needs Instruction   Patient understands incorporating nutritional management into lifestyle. Needs Instruction   Patient undertands incorporating physical activity into lifestyle. Needs Instruction   Patient understands using medications safely. Needs Instruction   Patient understands monitoring blood glucose, interpreting and using results Needs Instruction   Patient understands prevention, detection, and treatment of acute complications. Needs Instruction   Patient understands  prevention, detection, and treatment of chronic complications. Needs Instruction   Patient understands how to develop strategies to address psychosocial issues. Needs Instruction   Patient understands how to develop strategies to promote health/change behavior. Needs Instruction     Complications   Last HgB A1C per patient/outside source 7.5 %  05/19/17   How often do you check your blood sugar? 0 times/day (not testing)  Provided Contour Next meter and instructed on use. BG upon return demonstration was 127 mg/dL at 5:10 pm - 3 1/2 hrs pp.    Have you had a dilated eye exam in the past 12 months? Yes   Have you had a dental exam in the past 12 months? No   Are you checking your feet? No     Dietary Intake   Breakfast cheerioes, oatmeal, grits, occasional eggs   Snack (morning) fruit - strawberries, mango, oranges   Lunch ham sandwich or pimento cheese sandwich   Snack (afternoon) fruit   Dinner fish, chicken, beef, pork with asparagus, green beans, broccoli, cauliflower, beans, peas, corn, rice pasta, seldom salads   Beverage(s) unsweetened coffee, diet soda, Crystal Light     Exercise   Exercise Type ADL's     Patient Education   Previous Diabetes Education No   Disease state  Definition of diabetes, type 1 and 2, and the diagnosis of diabetes;Explored patient's options for treatment of their diabetes   Nutrition management  Role of diet in the treatment of diabetes and the relationship between the three main macronutrients and blood glucose level;Reviewed blood glucose goals for pre and post meals and how to evaluate  the patients' food intake on their blood glucose level.;Meal timing in regards to the patients' current diabetes medication.   Physical activity and exercise  Role of exercise on diabetes management, blood pressure control and cardiac health.   Medications Reviewed patients medication for diabetes, action, purpose, timing of dose and side effects. Pt reports having  stomach issues with plain Metformin and may need to switch to XR.    Monitoring Taught/evaluated SMBG meter.;Purpose and frequency of SMBG.;Taught/discussed recording of test results and interpretation of SMBG.;Identified appropriate SMBG and/or A1C goals.   Chronic complications Relationship between chronic complications and blood glucose control   Psychosocial adjustment Identified and addressed patients feelings and concerns about diabetes   Personal strategies to promote health Review risk of smoking and offered smoking cessation     Individualized Goals (developed by patient)   Reducing Risk Improve blood sugars Decrease medications Prevent diabetes complications Lose weight Lead a healthier lifestyle Become more fit Quit smoking     Outcomes   Expected Outcomes Demonstrated interest in learning. Expect positive outcomes   Future DMSE 2 wks      Individualized Plan for Diabetes Self-Management Training:   Learning Objective:  Patient will have a greater understanding of diabetes self-management. Patient education plan is to attend individual and/or group sessions per assessed needs and concerns.   Plan:   Patient Instructions  Check blood sugars 2 x day before breakfast and 2 hrs after supper every day Bring blood sugar records to the next class  Call your doctor for a prescription for:  1. Meter strips (type) Contour Next  checking  2 times per day  2. Lancets (type) Contour Microlet checking  2      times per day Exercise: Begin walking    for   10-15  minutes  3 days a week and gradually increase to 150 minutes/week Eat 3 meals day,   2  snacks a day Space meals 4-6 hours apart Don't skip meals Limit desserts/sweets Quit smoking   Expected Outcomes:  Demonstrated interest in learning. Expect positive outcomes  Education material provided: General Meal Planning Guidelines Simple Meal Plan Meter =  Contour Next  If problems or questions, patient to contact team  via:  Johny Drilling, [place], [place], CDE [phone]  Future DSME appointment: 2 wks  June 12, 2017 for Diabetes Class 1

## 2017-06-09 ENCOUNTER — Telehealth: Payer: Self-pay | Admitting: Physician Assistant

## 2017-06-09 DIAGNOSIS — E119 Type 2 diabetes mellitus without complications: Secondary | ICD-10-CM

## 2017-06-09 MED ORDER — GLUCOSE BLOOD VI STRP: ORAL_STRIP | 12 refills | Status: DC

## 2017-06-09 NOTE — Telephone Encounter (Signed)
Sent in

## 2017-06-09 NOTE — Telephone Encounter (Signed)
Pt needing Contour Next Strips for blood sugar testing.  Please send to [facility].

## 2017-06-12 ENCOUNTER — Encounter: Payer: Self-pay | Admitting: Dietician

## 2017-06-12 ENCOUNTER — Encounter: Payer: BLUE CROSS/BLUE SHIELD | Admitting: Dietician

## 2017-06-12 VITALS — Ht 71.0 in | Wt 225.1 lb

## 2017-06-12 DIAGNOSIS — Z6831 Body mass index (BMI) 31.0-31.9, adult: Secondary | ICD-10-CM | POA: Diagnosis not present

## 2017-06-12 DIAGNOSIS — E119 Type 2 diabetes mellitus without complications: Secondary | ICD-10-CM | POA: Diagnosis not present

## 2017-06-12 DIAGNOSIS — Z713 Dietary counseling and surveillance: Secondary | ICD-10-CM | POA: Diagnosis not present

## 2017-06-12 NOTE — Progress Notes (Signed)

## 2017-06-16 ENCOUNTER — Other Ambulatory Visit: Payer: Self-pay | Admitting: Physician Assistant

## 2017-06-16 ENCOUNTER — Ambulatory Visit (INDEPENDENT_AMBULATORY_CARE_PROVIDER_SITE_OTHER): Payer: BLUE CROSS/BLUE SHIELD | Admitting: Physician Assistant

## 2017-06-16 ENCOUNTER — Encounter: Payer: Self-pay | Admitting: Physician Assistant

## 2017-06-16 ENCOUNTER — Other Ambulatory Visit: Payer: Self-pay

## 2017-06-16 VITALS — BP 130/82 | HR 91 | Temp 97.8°F | Resp 16 | Wt 220.6 lb

## 2017-06-16 DIAGNOSIS — F32A Depression, unspecified: Secondary | ICD-10-CM

## 2017-06-16 DIAGNOSIS — R05 Cough: Secondary | ICD-10-CM

## 2017-06-16 DIAGNOSIS — B379 Candidiasis, unspecified: Secondary | ICD-10-CM

## 2017-06-16 DIAGNOSIS — J0140 Acute pansinusitis, unspecified: Secondary | ICD-10-CM | POA: Diagnosis not present

## 2017-06-16 DIAGNOSIS — T3695XA Adverse effect of unspecified systemic antibiotic, initial encounter: Secondary | ICD-10-CM

## 2017-06-16 DIAGNOSIS — Z9109 Other allergy status, other than to drugs and biological substances: Secondary | ICD-10-CM

## 2017-06-16 DIAGNOSIS — F329 Major depressive disorder, single episode, unspecified: Secondary | ICD-10-CM

## 2017-06-16 DIAGNOSIS — R059 Cough, unspecified: Secondary | ICD-10-CM

## 2017-06-16 MED ORDER — FLUCONAZOLE 150 MG PO TABS: 150.0000 mg | ORAL_TABLET | Freq: Once | ORAL | 0 refills | Status: AC

## 2017-06-16 MED ORDER — AMOXICILLIN-POT CLAVULANATE 875-125 MG PO TABS: 1.0000 | ORAL_TABLET | Freq: Two times a day (BID) | ORAL | 0 refills | Status: DC

## 2017-06-16 MED ORDER — BENZONATATE 200 MG PO CAPS: 200.0000 mg | ORAL_CAPSULE | Freq: Three times a day (TID) | ORAL | 3 refills | Status: DC | PRN

## 2017-06-16 MED ORDER — DULOXETINE HCL 60 MG PO CPEP: 60.0000 mg | ORAL_CAPSULE | Freq: Two times a day (BID) | ORAL | 6 refills | Status: DC

## 2017-06-16 MED ORDER — FLUTICASONE PROPIONATE 50 MCG/ACT NA SUSP: 2.0000 | Freq: Every day | NASAL | 1 refills | Status: DC

## 2017-06-16 MED ORDER — HYDROCOD POLST-CPM POLST ER 10-8 MG/5ML PO SUER: 5.0000 mL | Freq: Every evening | ORAL | 0 refills | Status: DC | PRN

## 2017-06-16 NOTE — Telephone Encounter (Addendum)
[facility] faxed a Pt contacted office for refill request on the following medications:  DULoxetine (CYMBALTA) 60 MG capsule   fluticasone (FLONASE) 50 MCG/ACT nasal spray   [facility]/MW

## 2017-06-16 NOTE — Patient Instructions (Signed)

## 2017-06-16 NOTE — Progress Notes (Signed)
Patient: Linda Bradford Female    DOB: July 18, 1956   61 y.o.   MRN: [id number] Visit Date: 06/16/2017  Today's Provider: Mar Daring, PA-C   Chief Complaint  Patient presents with  . Sinusitis   Subjective:    Sinusitis  This is a new problem. The current episode started yesterday. The problem has been gradually worsening since onset. There has been no fever. She is experiencing no pain. Associated symptoms include congestion, coughing, headaches, a hoarse voice, shortness of breath, sinus pressure, sneezing and a sore throat. Pertinent negatives include no chills or ear pain. Past treatments include spray decongestants, acetaminophen and oral decongestants (Allegra, nasal spray, inhalers). The treatment provided no relief.      Allergies  Allergen Reactions  . Dexilant [Dexlansoprazole] Nausea Only and Other (See Comments)    Dizziness     Current Outpatient Medications:  .  acetaminophen (TYLENOL) 500 MG tablet, Take 1,000 mg by mouth every 6 (six) hours as needed., Disp: , Rfl:  .  albuterol (PROVENTIL HFA;VENTOLIN HFA) 108 (90 Base) MCG/ACT inhaler, Inhale 1 puff into the lungs every 4 (four) hours as needed., Disp: 1 Inhaler, Rfl: 11 .  benzonatate (TESSALON) 200 MG capsule, Take 1 capsule (200 mg total) by mouth 3 (three) times daily as needed for cough., Disp: 90 capsule, Rfl: 3 .  clonazePAM (KLONOPIN) 0.5 MG tablet, Take 0.5 tablets (0.25 mg total) by mouth 2 (two) times daily as needed for anxiety. If 1/2 tablet is not effective can take one whole tablet twice daily., Disp: 60 tablet, Rfl: 5 .  Cyanocobalamin (RA VITAMIN B-12 TR) 1000 MCG TBCR, Take 1 tablet by mouth daily. , Disp: , Rfl:  .  diclofenac sodium (VOLTAREN) 1 % GEL, Apply 4 g topically 4 (four) times daily as needed. , Disp: , Rfl:  .  DULoxetine (CYMBALTA) 60 MG capsule, Take 1 capsule (60 mg total) by mouth 2 (two) times daily., Disp: 60 capsule, Rfl: 6 .  ezetimibe (ZETIA) 10 MG tablet, Take 1  tablet (10 mg total) by mouth daily., Disp: 90 tablet, Rfl: 1 .  fexofenadine (ALLEGRA) 180 MG tablet, Take 1 tablet (180 mg total) by mouth daily. (Patient taking differently: Take 180 mg by mouth daily as needed. ), Disp: 90 tablet, Rfl: 1 .  fluticasone (FLONASE) 50 MCG/ACT nasal spray, Place 2 sprays into both nostrils daily., Disp: 48 g, Rfl: 1 .  fluticasone furoate-vilanterol (BREO ELLIPTA) 200-25 MCG/INH AEPB, Inhale 1 puff into the lungs daily., Disp: 60 each, Rfl: 5 .  gabapentin (NEURONTIN) 300 MG capsule, Take 4 capsules (1,200 mg total) by mouth at bedtime., Disp: 120 capsule, Rfl: 5 .  glucose blood (CONTOUR NEXT TEST) test strip, To check blood sugar once daily, Disp: 100 each, Rfl: 12 .  ipratropium-albuterol (DUONEB) 0.5-2.5 (3) MG/3ML SOLN, Take 3 mLs by nebulization every 6 (six) hours as needed., Disp: 360 mL, Rfl: 11 .  levothyroxine (SYNTHROID, LEVOTHROID) 75 MCG tablet, Take 1 tablet (75 mcg total) by mouth daily., Disp: 90 tablet, Rfl: 1 .  losartan (COZAAR) 50 MG tablet, Take 1 tablet (50 mg total) by mouth daily., Disp: 90 tablet, Rfl: 1 .  metFORMIN (GLUCOPHAGE) 500 MG tablet, Take 1 tablet (500 mg total) by mouth 2 (two) times daily with a meal. (Patient taking differently: Take 500 mg by mouth daily with supper. ), Disp: 180 tablet, Rfl: 1 .  theophylline (THEO-24) 300 MG 24 hr capsule, Take 1 capsule (300 mg  total) by mouth daily., Disp: 90 capsule, Rfl: 1 .  tiZANidine (ZANAFLEX) 4 MG capsule, Take 1 capsule (4 mg total) by mouth 3 (three) times daily., Disp: 90 capsule, Rfl: 6 .  Vitamin D, Ergocalciferol, (DRISDOL) 50000 units CAPS capsule, Take 1 capsule (50,000 Units total) by mouth every 7 (seven) days., Disp: 12 capsule, Rfl: 3 .  nystatin (MYCOSTATIN) 100000 UNIT/ML suspension, Take 1 teaspoon PO every 3 days prn. Swish and spit (Patient not taking: Reported on 06/16/2017), Disp: 60 mL, Rfl: 3  Review of Systems  Constitutional: Negative for chills.  HENT:  Positive for congestion, hoarse voice, postnasal drip, rhinorrhea, sinus pressure, sinus pain, sneezing, sore throat and voice change. Negative for ear pain and trouble swallowing.   Eyes: Positive for itching (and watery).  Respiratory: Positive for cough, chest tightness, shortness of breath and wheezing.   Cardiovascular: Negative for chest pain, palpitations and leg swelling.  Gastrointestinal: Negative.   Neurological: Positive for headaches. Negative for dizziness and light-headedness.    Social History   Tobacco Use  . Smoking status: Current Every Day Smoker    Packs/day: 1.00    Years: 45.00    Pack years: 45.00    Types: Cigarettes    Start date: 03/02/1980  . Smokeless tobacco: Never Used  . Tobacco comment: every other day  Substance Use Topics  . Alcohol use: No    Alcohol/week: 0.0 oz   Objective:   BP 130/82 (BP Location: Left Arm, Patient Position: Sitting, Cuff Size: Large)   Pulse 91   Temp 97.8 F (36.6 C) (Oral)   Resp 16   Wt 220 lb 9.6 oz (100.1 kg)   SpO2 96%   BMI 30.77 kg/m  Vitals:   06/16/17 0954  BP: 130/82  Pulse: 91  Resp: 16  Temp: 97.8 F (36.6 C)  TempSrc: Oral  SpO2: 96%  Weight: 220 lb 9.6 oz (100.1 kg)     Physical Exam  Constitutional: She appears well-developed and well-nourished. No distress.  HENT:  Head: Normocephalic and atraumatic.  Right Ear: Hearing, tympanic membrane, external ear and ear canal normal.  Left Ear: Hearing, tympanic membrane, external ear and ear canal normal.  Nose: Right sinus exhibits maxillary sinus tenderness and frontal sinus tenderness. Left sinus exhibits maxillary sinus tenderness and frontal sinus tenderness.  Mouth/Throat: Uvula is midline, oropharynx is clear and moist and mucous membranes are normal. No oropharyngeal exudate.  Neck: Normal range of motion. Neck supple. No tracheal deviation present. No thyromegaly present.  Cardiovascular: Normal rate, regular rhythm and normal heart sounds.  Exam reveals no gallop and no friction rub.  No murmur heard. Pulmonary/Chest: Effort normal and breath sounds normal. No stridor. No respiratory distress. She has no decreased breath sounds. She has no wheezes. She has no rhonchi. She has no rales.  Rhonchi heard in bilateral upper lungs but patient then coughed and cleared  Lymphadenopathy:    She has no cervical adenopathy.  Skin: She is not diaphoretic.  Vitals reviewed.       Assessment & Plan:     1. Acute pansinusitis, recurrence not specified Worsening symptoms that have not responded to OTC medications. Will give augmentin as below. Continue allergy medications. Stay well hydrated and get plenty of rest. Call if no symptom improvement or if symptoms worsen. - amoxicillin-clavulanate (AUGMENTIN) 875-125 MG tablet; Take 1 tablet 2 (two) times daily by mouth.  Dispense: 20 tablet; Refill: 0  2. Cough Worsening symptoms that has not responded to OTC  medications. Will give Tussionex cough syrup as below for nighttime cough. Drowsiness precautions given to patient. Stay well hydrated. Use benzonatate for daytime cough. - benzonatate (TESSALON) 200 MG capsule; Take 1 capsule (200 mg total) 3 (three) times daily as needed by mouth for cough.  Dispense: 90 capsule; Refill: 3 - chlorpheniramine-HYDROcodone (TUSSIONEX PENNKINETIC ER) 10-8 MG/5ML SUER; Take 5 mLs at bedtime as needed by mouth for cough.  Dispense: 140 mL; Refill: 0  3. Antibiotic-induced yeast infection Gets with augmentin. Diflucan given for prophylaxis.  - fluconazole (DIFLUCAN) 150 MG tablet; Take 1 tablet (150 mg total) once for 1 dose by mouth.  Dispense: 2 tablet; Refill: 0       Mar Daring, PA-C  [facility]

## 2017-06-19 ENCOUNTER — Encounter: Payer: Self-pay | Admitting: Dietician

## 2017-06-19 ENCOUNTER — Encounter: Payer: BLUE CROSS/BLUE SHIELD | Admitting: Dietician

## 2017-06-19 VITALS — Wt 224.5 lb

## 2017-06-19 DIAGNOSIS — Z713 Dietary counseling and surveillance: Secondary | ICD-10-CM | POA: Diagnosis not present

## 2017-06-19 DIAGNOSIS — Z6831 Body mass index (BMI) 31.0-31.9, adult: Secondary | ICD-10-CM | POA: Diagnosis not present

## 2017-06-19 DIAGNOSIS — E119 Type 2 diabetes mellitus without complications: Secondary | ICD-10-CM | POA: Diagnosis not present

## 2017-06-19 NOTE — Progress Notes (Signed)

## 2017-06-27 ENCOUNTER — Telehealth: Payer: Self-pay | Admitting: Dietician

## 2017-06-27 NOTE — Telephone Encounter (Signed)
Called patient to reschedule class 3, which she missed on 06/26/17. She rescheduled to 07/17/17.

## 2017-07-07 DIAGNOSIS — M7710 Lateral epicondylitis, unspecified elbow: Secondary | ICD-10-CM | POA: Insufficient documentation

## 2017-07-07 DIAGNOSIS — M7711 Lateral epicondylitis, right elbow: Secondary | ICD-10-CM | POA: Diagnosis not present

## 2017-07-17 ENCOUNTER — Encounter: Payer: Self-pay | Admitting: Dietician

## 2017-07-18 ENCOUNTER — Encounter: Payer: Self-pay | Admitting: Dietician

## 2017-07-18 ENCOUNTER — Encounter: Payer: BLUE CROSS/BLUE SHIELD | Attending: Physician Assistant | Admitting: Dietician

## 2017-07-18 ENCOUNTER — Other Ambulatory Visit: Payer: Self-pay | Admitting: Physician Assistant

## 2017-07-18 VITALS — BP 140/80 | Ht 71.0 in | Wt 222.5 lb

## 2017-07-18 DIAGNOSIS — E119 Type 2 diabetes mellitus without complications: Secondary | ICD-10-CM | POA: Insufficient documentation

## 2017-07-18 DIAGNOSIS — Z713 Dietary counseling and surveillance: Secondary | ICD-10-CM | POA: Insufficient documentation

## 2017-07-18 DIAGNOSIS — Z6831 Body mass index (BMI) 31.0-31.9, adult: Secondary | ICD-10-CM | POA: Insufficient documentation

## 2017-07-18 DIAGNOSIS — E039 Hypothyroidism, unspecified: Secondary | ICD-10-CM

## 2017-07-18 MED ORDER — LEVOTHYROXINE SODIUM 75 MCG PO TABS: 75.0000 ug | ORAL_TABLET | Freq: Every day | ORAL | 1 refills | Status: DC

## 2017-07-18 NOTE — Progress Notes (Signed)

## 2017-07-18 NOTE — Telephone Encounter (Signed)
[facility] faxed a request for a refill for the following medication. Thanks CC  levothyroxine (SYNTHROID, LEVOTHROID) 75 MCG tablet

## 2017-07-19 ENCOUNTER — Telehealth: Payer: Self-pay

## 2017-07-19 NOTE — Telephone Encounter (Signed)
Patient reminded to complete Cologuard.

## 2017-07-20 ENCOUNTER — Encounter: Payer: Self-pay | Admitting: *Deleted

## 2017-08-02 ENCOUNTER — Encounter: Payer: Self-pay | Admitting: Physician Assistant

## 2017-08-02 ENCOUNTER — Ambulatory Visit (INDEPENDENT_AMBULATORY_CARE_PROVIDER_SITE_OTHER): Payer: BLUE CROSS/BLUE SHIELD | Admitting: Physician Assistant

## 2017-08-02 VITALS — BP 128/70 | HR 88 | Temp 98.2°F | Resp 20 | Wt 223.0 lb

## 2017-08-02 DIAGNOSIS — J209 Acute bronchitis, unspecified: Secondary | ICD-10-CM

## 2017-08-02 DIAGNOSIS — J0100 Acute maxillary sinusitis, unspecified: Secondary | ICD-10-CM | POA: Diagnosis not present

## 2017-08-02 DIAGNOSIS — R05 Cough: Secondary | ICD-10-CM

## 2017-08-02 DIAGNOSIS — J440 Chronic obstructive pulmonary disease with acute lower respiratory infection: Secondary | ICD-10-CM

## 2017-08-02 DIAGNOSIS — R059 Cough, unspecified: Secondary | ICD-10-CM

## 2017-08-02 MED ORDER — AZITHROMYCIN 250 MG PO TABS: ORAL_TABLET | ORAL | 0 refills | Status: DC

## 2017-08-02 MED ORDER — HYDROCOD POLST-CPM POLST ER 10-8 MG/5ML PO SUER: 5.0000 mL | Freq: Every evening | ORAL | 0 refills | Status: DC | PRN

## 2017-08-02 MED ORDER — PREDNISONE 10 MG PO TABS: ORAL_TABLET | ORAL | 0 refills | Status: DC

## 2017-08-02 NOTE — Patient Instructions (Signed)

## 2017-08-02 NOTE — Progress Notes (Signed)
Patient: Linda Bradford Female    DOB: [birth year]   62 y.o.   MRN: [id number] Visit Date: 08/02/2017  Today's Provider: Mar Daring, PA-C   Chief Complaint  Patient presents with  . URI   Subjective:    HPI Upper Respiratory Infection: Patient complains of symptoms of a URI. Symptoms include congestion and cough. Onset of symptoms was 6 days ago, gradually worsening since that time. She also c/o productive cough with  clear colored sputum for the past 6 days .  She is drinking plenty of fluids. Evaluation to date: none. Treatment to date: inhalers. Patient reports she last used her albuterol inhaler today around 12 noon. Patient reports that she has been using inhaler at least 3 times a day.      Allergies  Allergen Reactions  . Dexilant [Dexlansoprazole] Nausea Only and Other (See Comments)    Dizziness     Current Outpatient Medications:  .  acetaminophen (TYLENOL) 500 MG tablet, Take 1,000 mg by mouth every 6 (six) hours as needed., Disp: , Rfl:  .  albuterol (PROVENTIL HFA;VENTOLIN HFA) 108 (90 Base) MCG/ACT inhaler, Inhale 1 puff into the lungs every 4 (four) hours as needed., Disp: 1 Inhaler, Rfl: 11 .  benzonatate (TESSALON) 200 MG capsule, Take 1 capsule (200 mg total) 3 (three) times daily as needed by mouth for cough., Disp: 90 capsule, Rfl: 3 .  clonazePAM (KLONOPIN) 0.5 MG tablet, Take 0.5 tablets (0.25 mg total) by mouth 2 (two) times daily as needed for anxiety. If 1/2 tablet is not effective can take one whole tablet twice daily., Disp: 60 tablet, Rfl: 5 .  Cyanocobalamin (RA VITAMIN B-12 TR) 1000 MCG TBCR, Take 1 tablet by mouth daily. , Disp: , Rfl:  .  diclofenac sodium (VOLTAREN) 1 % GEL, Apply 4 g topically 4 (four) times daily as needed. , Disp: , Rfl:  .  DULoxetine (CYMBALTA) 60 MG capsule, Take 1 capsule (60 mg total) 2 (two) times daily by mouth., Disp: 60 capsule, Rfl: 6 .  ezetimibe (ZETIA) 10 MG tablet, Take 1 tablet (10 mg total) by mouth  daily., Disp: 90 tablet, Rfl: 1 .  fexofenadine (ALLEGRA) 180 MG tablet, Take 1 tablet (180 mg total) by mouth daily. (Patient taking differently: Take 180 mg by mouth daily as needed. ), Disp: 90 tablet, Rfl: 1 .  fluticasone (FLONASE) 50 MCG/ACT nasal spray, Place 2 sprays daily into both nostrils., Disp: 48 g, Rfl: 1 .  fluticasone furoate-vilanterol (BREO ELLIPTA) 200-25 MCG/INH AEPB, Inhale 1 puff into the lungs daily., Disp: 60 each, Rfl: 5 .  gabapentin (NEURONTIN) 300 MG capsule, Take 4 capsules (1,200 mg total) by mouth at bedtime., Disp: 120 capsule, Rfl: 5 .  glucose blood (CONTOUR NEXT TEST) test strip, To check blood sugar once daily, Disp: 100 each, Rfl: 12 .  ipratropium-albuterol (DUONEB) 0.5-2.5 (3) MG/3ML SOLN, Take 3 mLs by nebulization every 6 (six) hours as needed., Disp: 360 mL, Rfl: 11 .  levothyroxine (SYNTHROID, LEVOTHROID) 75 MCG tablet, Take 1 tablet (75 mcg total) by mouth daily., Disp: 90 tablet, Rfl: 1 .  losartan (COZAAR) 50 MG tablet, Take 1 tablet (50 mg total) by mouth daily., Disp: 90 tablet, Rfl: 1 .  metFORMIN (GLUCOPHAGE) 500 MG tablet, Take 1 tablet (500 mg total) by mouth 2 (two) times daily with a meal. (Patient taking differently: Take 500 mg by mouth daily with supper. ), Disp: 180 tablet, Rfl: 1 .  theophylline (THEO-24)  300 MG 24 hr capsule, Take 1 capsule (300 mg total) by mouth daily., Disp: 90 capsule, Rfl: 1 .  tiZANidine (ZANAFLEX) 4 MG capsule, Take 1 capsule (4 mg total) by mouth 3 (three) times daily., Disp: 90 capsule, Rfl: 6 .  Vitamin D, Ergocalciferol, (DRISDOL) 50000 units CAPS capsule, Take 1 capsule (50,000 Units total) by mouth every 7 (seven) days., Disp: 12 capsule, Rfl: 3  Review of Systems  Constitutional: Positive for fatigue.  HENT: Positive for congestion, rhinorrhea, sinus pressure and sinus pain.   Respiratory: Positive for cough, shortness of breath and wheezing.   Cardiovascular: Negative.   Neurological: Positive for  headaches.    Social History   Tobacco Use  . Smoking status: Current Every Day Smoker    Packs/day: 1.00    Years: 45.00    Pack years: 45.00    Types: Cigarettes    Start date: 03/02/1980  . Smokeless tobacco: Never Used  . Tobacco comment: every other day  Substance Use Topics  . Alcohol use: No    Alcohol/week: 0.0 oz   Objective:   BP 128/70 (BP Location: Left Arm, Patient Position: Sitting, Cuff Size: Large)   Pulse 88   Temp 98.2 F (36.8 C) (Oral)   Resp 20   Wt 223 lb (101.2 kg)   SpO2 93%   BMI 31.10 kg/m  Vitals:   08/02/17 1609  BP: 128/70  Pulse: 88  Resp: 20  Temp: 98.2 F (36.8 C)  TempSrc: Oral  SpO2: 93%  Weight: 223 lb (101.2 kg)     Physical Exam  Constitutional: She appears well-developed and well-nourished. No distress.  HENT:  Head: Normocephalic and atraumatic.  Right Ear: Hearing, tympanic membrane, external ear and ear canal normal.  Left Ear: Hearing, tympanic membrane, external ear and ear canal normal.  Nose: Nose normal.  Mouth/Throat: Uvula is midline, oropharynx is clear and moist and mucous membranes are normal. No oropharyngeal exudate.  Eyes: Conjunctivae are normal. Pupils are equal, round, and reactive to light. Right eye exhibits no discharge. Left eye exhibits no discharge. No scleral icterus.  Neck: Normal range of motion. Neck supple. No tracheal deviation present. No thyromegaly present.  Cardiovascular: Normal rate, regular rhythm and normal heart sounds. Exam reveals no gallop and no friction rub.  No murmur heard. Pulmonary/Chest: Effort normal. No stridor. No respiratory distress. She has wheezes. She has rhonchi. She has rales (cleared with cough).  Lymphadenopathy:    She has no cervical adenopathy.  Skin: Skin is warm and dry. She is not diaphoretic.  Vitals reviewed.      Assessment & Plan:     1. Acute bronchitis with COPD ([place]) Will give prednisone and Zpak as below. Continue allergy and COPD medications  as prescribed. Watch BS with steroid use. Call if no improvements or worsening symptoms.  - predniSONE (DELTASONE) 10 MG tablet; Take 6 tabs PO on day 1&2, 5 tabs PO on day 3&4, 4 tabs PO on day 5&6, 3 tabs PO on day 7&8, 2 tabs PO on day 9&10, 1 tab PO on day 11&12.  Dispense: 42 tablet; Refill: 0 - azithromycin (ZITHROMAX) 250 MG tablet; Take 2 tablets PO on day one, and one tablet PO daily thereafter until completed.  Dispense: 6 tablet; Refill: 0  2. Acute maxillary sinusitis, recurrence not specified See above medical treatment plan. - azithromycin (ZITHROMAX) 250 MG tablet; Take 2 tablets PO on day one, and one tablet PO daily thereafter until completed.  Dispense: 6  tablet; Refill: 0  3. Cough Worsening symptoms that has not responded to OTC medications. Will give Tussionex cough syrup as below for nighttime cough. Drowsiness precautions given to patient. Stay well hydrated. Use Tessalon perles for daytime cough.  - chlorpheniramine-HYDROcodone (TUSSIONEX PENNKINETIC ER) 10-8 MG/5ML SUER; Take 5 mLs by mouth at bedtime as needed for cough.  Dispense: 140 mL; Refill: 0       Mar Daring, PA-C  [facility]

## 2017-08-04 ENCOUNTER — Other Ambulatory Visit: Payer: Self-pay | Admitting: Physician Assistant

## 2017-08-04 DIAGNOSIS — J411 Mucopurulent chronic bronchitis: Secondary | ICD-10-CM

## 2017-08-04 MED ORDER — ALBUTEROL SULFATE HFA 108 (90 BASE) MCG/ACT IN AERS: 1.0000 | INHALATION_SPRAY | RESPIRATORY_TRACT | 11 refills | Status: DC | PRN

## 2017-08-04 NOTE — Telephone Encounter (Signed)
[facility] faxed a refill request for the following medication. Thanks CC  albuterol (PROVENTIL HFA;VENTOLIN HFA) 108 (90 Base) MCG/ACT inhaler

## 2017-08-04 NOTE — Telephone Encounter (Signed)
Please advise 

## 2017-08-09 ENCOUNTER — Telehealth: Payer: Self-pay | Admitting: Physician Assistant

## 2017-08-09 DIAGNOSIS — J440 Chronic obstructive pulmonary disease with acute lower respiratory infection: Secondary | ICD-10-CM

## 2017-08-09 DIAGNOSIS — J069 Acute upper respiratory infection, unspecified: Secondary | ICD-10-CM

## 2017-08-09 DIAGNOSIS — B379 Candidiasis, unspecified: Secondary | ICD-10-CM

## 2017-08-09 DIAGNOSIS — T3695XA Adverse effect of unspecified systemic antibiotic, initial encounter: Secondary | ICD-10-CM

## 2017-08-09 DIAGNOSIS — J209 Acute bronchitis, unspecified: Secondary | ICD-10-CM

## 2017-08-09 MED ORDER — FLUCONAZOLE 150 MG PO TABS: 150.0000 mg | ORAL_TABLET | Freq: Once | ORAL | 0 refills | Status: AC

## 2017-08-09 MED ORDER — AMOXICILLIN-POT CLAVULANATE 875-125 MG PO TABS: 1.0000 | ORAL_TABLET | Freq: Two times a day (BID) | ORAL | 0 refills | Status: DC

## 2017-08-09 NOTE — Telephone Encounter (Signed)
Attempted to contact patient. No answer or voicemail setup.

## 2017-08-09 NOTE — Telephone Encounter (Signed)
I dont give toradol for breathing. If she is still having issues even with prednisone she can come for nebulizer treatment.

## 2017-08-09 NOTE — Telephone Encounter (Signed)
Patient advised as directed below. Per patient she was asking for the Toradol shot because the oral prednisone is not helping.

## 2017-08-09 NOTE — Telephone Encounter (Signed)
Patient reports that she meant to ask for prednisone injection. Patient reports she has been using nebulizer 3 times a day, reports mild symptom improvement. Please advise.

## 2017-08-09 NOTE — Telephone Encounter (Signed)
We do not have shots for antibiotics appropriate to treat what she has.  I will send in augmentin with diflucan

## 2017-08-09 NOTE — Telephone Encounter (Signed)
Patient states that she is not getting better, wanted to know if she can get a different antibiotic or maybe a shot?  CB# [phone]

## 2017-08-09 NOTE — Telephone Encounter (Signed)
Unfortunately steroid injections wont help any better than a taper and only will work for one day, they dont last in the system like she would need it. We can do another taper if needed.

## 2017-08-10 MED ORDER — PREDNISONE 10 MG PO TABS: ORAL_TABLET | ORAL | 0 refills | Status: DC

## 2017-08-10 NOTE — Telephone Encounter (Signed)
Patient advised as below. Patient verbalizes understanding and is in agreement with treatment plan. Patient requested to have another prednisone taper sent into [facility].

## 2017-08-10 NOTE — Addendum Note (Signed)
Addended by: Mar Daring on: 08/10/2017 11:00 AM   Modules accepted: Orders

## 2017-08-10 NOTE — Telephone Encounter (Signed)
Sent!

## 2017-08-24 DIAGNOSIS — Z23 Encounter for immunization: Secondary | ICD-10-CM | POA: Diagnosis not present

## 2017-08-24 DIAGNOSIS — W5501XA Bitten by cat, initial encounter: Secondary | ICD-10-CM | POA: Diagnosis not present

## 2017-08-24 DIAGNOSIS — S61451A Open bite of right hand, initial encounter: Secondary | ICD-10-CM | POA: Diagnosis not present

## 2017-08-25 ENCOUNTER — Encounter: Payer: Self-pay | Admitting: Physician Assistant

## 2017-08-25 ENCOUNTER — Ambulatory Visit (INDEPENDENT_AMBULATORY_CARE_PROVIDER_SITE_OTHER): Payer: BLUE CROSS/BLUE SHIELD | Admitting: Physician Assistant

## 2017-08-25 VITALS — BP 128/76 | HR 84 | Temp 98.0°F | Resp 16 | Ht 70.5 in | Wt 220.6 lb

## 2017-08-25 DIAGNOSIS — J418 Mixed simple and mucopurulent chronic bronchitis: Secondary | ICD-10-CM

## 2017-08-25 DIAGNOSIS — W5501XA Bitten by cat, initial encounter: Secondary | ICD-10-CM

## 2017-08-25 DIAGNOSIS — E119 Type 2 diabetes mellitus without complications: Secondary | ICD-10-CM

## 2017-08-25 DIAGNOSIS — S61451A Open bite of right hand, initial encounter: Secondary | ICD-10-CM

## 2017-08-25 LAB — POCT GLYCOSYLATED HEMOGLOBIN (HGB A1C)
Est. average glucose Bld gHb Est-mCnc: 180
Hemoglobin A1C: 7.9

## 2017-08-25 MED ORDER — METFORMIN HCL ER 500 MG PO TB24: 1000.0000 mg | ORAL_TABLET | Freq: Every day | ORAL | 3 refills | Status: DC

## 2017-08-25 NOTE — Progress Notes (Signed)
Patient: Linda Bradford Female    DOB: October 13, 1955   62 y.o.   MRN: [id number] Visit Date: 08/25/2017  Today's Provider: Mar Daring, PA-C   Chief Complaint  Patient presents with  . Diabetes  . Animal Bite   Subjective:    HPI  Diabetes Mellitus Type II, Follow-up:   Lab Results  Component Value Date   HGBA1C 7.9 08/25/2017   HGBA1C 7.5 (H) 05/19/2017    Last seen for diabetes 3 months ago.  Management since then includes starting Metformin twice daily. She reports good compliance with treatment. She is not having side effects. She initially had diarrhea, but reports that this is much better. Current symptoms include none and have been stable. Home blood sugar records: trend: stable  Episodes of hypoglycemia? no   Current Insulin Regimen: none Most Recent Eye Exam: due Weight trend: stable Prior visit with dietician: yes - [facility]. She reports that her last visit was last month. Current diet: well balanced Current exercise: no regular exercise  Pertinent Labs:    Component Value Date/Time   CHOL 265 (H) 05/19/2017 0823   TRIG 204 (H) 05/19/2017 0823   HDL 62 05/19/2017 0823   CREATININE 0.58 05/19/2017 0823    Wt Readings from Last 3 Encounters:  08/25/17 220 lb 9.6 oz (100.1 kg)  08/02/17 223 lb (101.2 kg)  07/18/17 222 lb 8 oz (100.9 kg)    Cat Bite: Patient reports that she was seen at the walk in clinic yesterday due to a cat bite. She still has some redness and swelling, and reports that it is becoming more painful. She was prescribed Augmentin yesterday, and reports that she is tolerating the medication well.      Allergies  Allergen Reactions  . Dexilant [Dexlansoprazole] Nausea Only and Other (See Comments)    Dizziness     Current Outpatient Medications:  .  acetaminophen (TYLENOL) 500 MG tablet, Take 1,000 mg by mouth every 6 (six) hours as needed., Disp: , Rfl:  .  albuterol (PROVENTIL HFA;VENTOLIN  HFA) 108 (90 Base) MCG/ACT inhaler, Inhale 1 puff into the lungs every 4 (four) hours as needed., Disp: 1 Inhaler, Rfl: 11 .  amoxicillin-clavulanate (AUGMENTIN) 875-125 MG tablet, Take 1 tablet by mouth 2 (two) times daily., Disp: 20 tablet, Rfl: 0 .  benzonatate (TESSALON) 200 MG capsule, Take 1 capsule (200 mg total) 3 (three) times daily as needed by mouth for cough., Disp: 90 capsule, Rfl: 3 .  chlorpheniramine-HYDROcodone (TUSSIONEX PENNKINETIC ER) 10-8 MG/5ML SUER, Take 5 mLs by mouth at bedtime as needed for cough., Disp: 140 mL, Rfl: 0 .  clonazePAM (KLONOPIN) 0.5 MG tablet, Take 0.5 tablets (0.25 mg total) by mouth 2 (two) times daily as needed for anxiety. If 1/2 tablet is not effective can take one whole tablet twice daily., Disp: 60 tablet, Rfl: 5 .  Cyanocobalamin (RA VITAMIN B-12 TR) 1000 MCG TBCR, Take 1 tablet by mouth daily. , Disp: , Rfl:  .  diclofenac sodium (VOLTAREN) 1 % GEL, Apply 4 g topically 4 (four) times daily as needed. , Disp: , Rfl:  .  DULoxetine (CYMBALTA) 60 MG capsule, Take 1 capsule (60 mg total) 2 (two) times daily by mouth., Disp: 60 capsule, Rfl: 6 .  ezetimibe (ZETIA) 10 MG tablet, Take 1 tablet (10 mg total) by mouth daily., Disp: 90 tablet, Rfl: 1 .  fexofenadine (ALLEGRA) 180 MG tablet, Take 1 tablet (180 mg  total) by mouth daily. (Patient taking differently: Take 180 mg by mouth daily as needed. ), Disp: 90 tablet, Rfl: 1 .  fluticasone (FLONASE) 50 MCG/ACT nasal spray, Place 2 sprays daily into both nostrils., Disp: 48 g, Rfl: 1 .  fluticasone furoate-vilanterol (BREO ELLIPTA) 200-25 MCG/INH AEPB, Inhale 1 puff into the lungs daily., Disp: 60 each, Rfl: 5 .  gabapentin (NEURONTIN) 300 MG capsule, Take 4 capsules (1,200 mg total) by mouth at bedtime., Disp: 120 capsule, Rfl: 5 .  glucose blood (CONTOUR NEXT TEST) test strip, To check blood sugar once daily, Disp: 100 each, Rfl: 12 .  ipratropium-albuterol (DUONEB) 0.5-2.5 (3) MG/3ML SOLN, Take 3 mLs by  nebulization every 6 (six) hours as needed., Disp: 360 mL, Rfl: 11 .  levothyroxine (SYNTHROID, LEVOTHROID) 75 MCG tablet, Take 1 tablet (75 mcg total) by mouth daily., Disp: 90 tablet, Rfl: 1 .  losartan (COZAAR) 50 MG tablet, Take 1 tablet (50 mg total) by mouth daily., Disp: 90 tablet, Rfl: 1 .  metFORMIN (GLUCOPHAGE) 500 MG tablet, Take 1 tablet (500 mg total) by mouth 2 (two) times daily with a meal. (Patient taking differently: Take 500 mg by mouth daily with supper. ), Disp: 180 tablet, Rfl: 1 .  predniSONE (DELTASONE) 10 MG tablet, Take 6 tabs PO on day 1&2, 5 tabs PO on day 3&4, 4 tabs PO on day 5&6, 3 tabs PO on day 7&8, 2 tabs PO on day 9&10, 1 tab PO on day 11&12., Disp: 42 tablet, Rfl: 0 .  theophylline (THEO-24) 300 MG 24 hr capsule, Take 1 capsule (300 mg total) by mouth daily., Disp: 90 capsule, Rfl: 1 .  tiZANidine (ZANAFLEX) 4 MG capsule, Take 1 capsule (4 mg total) by mouth 3 (three) times daily., Disp: 90 capsule, Rfl: 6 .  Vitamin D, Ergocalciferol, (DRISDOL) 50000 units CAPS capsule, Take 1 capsule (50,000 Units total) by mouth every 7 (seven) days., Disp: 12 capsule, Rfl: 3  Review of Systems  Constitutional: Positive for fatigue.  HENT: Positive for congestion.   Respiratory: Negative for cough, chest tightness and shortness of breath.   Cardiovascular: Negative for chest pain, palpitations and leg swelling.  Gastrointestinal: Negative for abdominal pain.  Endocrine: Negative.  Negative for cold intolerance, heat intolerance, polydipsia, polyphagia and polyuria.  Musculoskeletal: Negative.   Skin: Positive for color change and rash.  Neurological: Negative for dizziness, light-headedness and headaches.    Social History   Tobacco Use  . Smoking status: Current Every Day Smoker    Packs/day: 1.00    Years: 45.00    Pack years: 45.00    Types: Cigarettes    Start date: 03/02/1980  . Smokeless tobacco: Never Used  . Tobacco comment: every other day  Substance Use  Topics  . Alcohol use: No    Alcohol/week: 0.0 oz   Objective:   BP 128/76 (BP Location: Left Arm, Patient Position: Sitting, Cuff Size: Large)   Pulse 84   Temp 98 F (36.7 C)   Resp 16   Ht 5' 10.5" (1.791 m)   Wt 220 lb 9.6 oz (100.1 kg)   SpO2 95%   BMI 31.21 kg/m  Vitals:   08/25/17 0812  BP: 128/76  Pulse: 84  Resp: 16  Temp: 98 F (36.7 C)  SpO2: 95%  Weight: 220 lb 9.6 oz (100.1 kg)  Height: 5' 10.5" (1.791 m)     Physical Exam  Constitutional: She appears well-developed and well-nourished. No distress.  Neck: Normal range of motion.  Neck supple.  Cardiovascular: Normal rate, regular rhythm and normal heart sounds. Exam reveals no gallop and no friction rub.  No murmur heard. Pulmonary/Chest: Effort normal. No respiratory distress. She has wheezes. She has no rales.  Skin: She is not diaphoretic. There is erythema.     Vitals reviewed.       Assessment & Plan:     1. Diabetes mellitus without complication (HCC) H6D up to 7.9 but patient has recently had to be treated with steroids due to acute on chronic bronchitis and now with infection from cat bite. Will change medication to metformin xr 500mg  take 1000mg  daily.  - POCT glycosylated hemoglobin (Hb A1C) - metFORMIN (GLUCOPHAGE-XR) 500 MG 24 hr tablet; Take 2 tablets (1,000 mg total) by mouth daily with supper.  Dispense: 60 tablet; Refill: 3  2. Mixed simple and mucopurulent chronic bronchitis (HCC) Stable.   3. Cat bite of hand, right, initial encounter Worsening x 1 day. Just started augmentin yesterday afternoon. Will have patient continue augmentin. Epsom salt soaks and ice prn. Tylenol prn. Cellulitis outlined. Return precautions given. Precautions for ER need given to patient as well. I will see her back on Monday to recheck.       Mar Daring, PA-C  [facility]

## 2017-08-25 NOTE — Patient Instructions (Addendum)
Pasteurella Multocida Infection This infection is caused by the Pasteurella multocida or P. multocida bacterium. This type of bacteria can cause a bad skin infection (cellulitis). Then, the infection can spread into joints, bones, and tendons. The infection also can spread to your blood, but this is rare. If it does spread to your blood, you can develop a heart infection (endocarditis). The bacteria can also cause an infection on the surface of the brain (meningitis). What are the causes? This kind of infection is usually caused by a bite or a scratch from an animal that carries the bacterium in its saliva. It can also occur after an infected animal licks a person's skin where there is a cut or a scratch or licks a person near the eyes, nose, or mouth. The following animals can all carry the bacteria:  Cats and dogs. Most cats and many dogs have P. multocida living in their   mouths.  Poultry, such as Sales promotion account executive and turkeys.  Livestock, such as cows, horses, and sheep.  Sometimes, the cause of this infection is not known. What increases the risk? This condition is more likely to develop in people who:  Live with a dog or cat.  Work with live Teaching laboratory technician.  Have a weak defense (immune) system.  What are the signs or symptoms? Symptoms usually start within 24 hours after contact with an infected animal. Symptoms include:  Pain, redness, warmth, and swelling around the bite, cut, or scratch.  Swollen glands near the skin infection.  Fluid leaking from the bite, cut, or scratch area.  Fever.  Symptoms of a more complicated disease may develop later. These may include:  Joint pain. This can make it hard for you to move.  Bone pain.  How is this diagnosed? This condition is diagnosed with a medical history and physical exam. You may also have tests, including:  Wound fluid sample test.  Joint fluid sample test.  Blood tests.  Imaging tests. This may include a CT scan  or MRI scan.  How is this treated? This condition is treated with antibiotic medicines. These medicines may be given by mouth or through an IV tube. This depends on the severity and location of the infection. You may also need a tetanus shot. Follow these instructions at home:  Take your antibiotic medicine as told by your health care provider. Do not stop taking the antibiotic even if you start to feel better.  Rest as told by your health care provider.  Check your wound every day for signs of the infection getting worse. Check for: ? More redness, swelling, or pain. ? More fluid or blood. ? Warmth. ? Pus or a bad smell.  Keep all follow-up visits as told by your health care provider. This is important. Contact a health care provider if:  You got a tetanus shot and you have swelling, severe pain, redness, or bleeding at the injection site.  You have a fever.  You have more redness, swelling, or pain around your wound.  You have more fluid or blood coming from your wound.  Your wound feels warm to the touch.  You have pus or a bad smell coming from your wound.  You have trouble moving the infected area.  Your joints swell. Get help right away if:  You develop a bad headache or a stiff neck.  You have chest pain.  You have trouble breathing. This information is not intended to replace advice given to you by your health care  provider. Make sure you discuss any questions you have with your health care provider. Document Released: 04/05/2011 Document Revised: 12/24/2015 Document Reviewed: 01/21/2015 Elsevier Interactive Patient Education  [employer].

## 2017-08-28 ENCOUNTER — Encounter: Payer: Self-pay | Admitting: Physician Assistant

## 2017-08-28 ENCOUNTER — Ambulatory Visit (INDEPENDENT_AMBULATORY_CARE_PROVIDER_SITE_OTHER): Payer: BLUE CROSS/BLUE SHIELD | Admitting: Physician Assistant

## 2017-08-28 VITALS — BP 140/80 | HR 84 | Temp 98.3°F | Resp 16 | Wt 220.0 lb

## 2017-08-28 DIAGNOSIS — W5501XD Bitten by cat, subsequent encounter: Secondary | ICD-10-CM | POA: Diagnosis not present

## 2017-08-28 DIAGNOSIS — L089 Local infection of the skin and subcutaneous tissue, unspecified: Secondary | ICD-10-CM | POA: Diagnosis not present

## 2017-08-28 NOTE — Progress Notes (Signed)
Patient: Linda Bradford Female    DOB: [birth year]   62 y.o.   MRN: [id number] Visit Date: 08/28/2017  Today's Provider: Mar Daring, PA-C   Chief Complaint  Patient presents with  . Follow-up    cat bite   Subjective:    HPI  Follow up for cat bite  The patient was last seen for this 3 days ago. Changes made at last visit include continue Augmentin, Epsom salt soaks and ice to hand.  She reports excellent compliance with treatment. She feels that condition is Improved. She is not having side effects.  ------------------------------------------------------------------------------------     Allergies  Allergen Reactions  . Dexilant [Dexlansoprazole] Nausea Only and Other (See Comments)    Dizziness     Current Outpatient Medications:  .  acetaminophen (TYLENOL) 500 MG tablet, Take 1,000 mg by mouth every 6 (six) hours as needed., Disp: , Rfl:  .  albuterol (PROVENTIL HFA;VENTOLIN HFA) 108 (90 Base) MCG/ACT inhaler, Inhale 1 puff into the lungs every 4 (four) hours as needed., Disp: 1 Inhaler, Rfl: 11 .  amoxicillin-clavulanate (AUGMENTIN) 875-125 MG tablet, Take 1 tablet by mouth 2 (two) times daily., Disp: 20 tablet, Rfl: 0 .  benzonatate (TESSALON) 200 MG capsule, Take 1 capsule (200 mg total) 3 (three) times daily as needed by mouth for cough., Disp: 90 capsule, Rfl: 3 .  chlorpheniramine-HYDROcodone (TUSSIONEX PENNKINETIC ER) 10-8 MG/5ML SUER, Take 5 mLs by mouth at bedtime as needed for cough., Disp: 140 mL, Rfl: 0 .  clonazePAM (KLONOPIN) 0.5 MG tablet, Take 0.5 tablets (0.25 mg total) by mouth 2 (two) times daily as needed for anxiety. If 1/2 tablet is not effective can take one whole tablet twice daily., Disp: 60 tablet, Rfl: 5 .  Cyanocobalamin (RA VITAMIN B-12 TR) 1000 MCG TBCR, Take 1 tablet by mouth daily. , Disp: , Rfl:  .  diclofenac sodium (VOLTAREN) 1 % GEL, Apply 4 g topically 4 (four) times daily as needed. , Disp: , Rfl:  .  DULoxetine  (CYMBALTA) 60 MG capsule, Take 1 capsule (60 mg total) 2 (two) times daily by mouth., Disp: 60 capsule, Rfl: 6 .  ezetimibe (ZETIA) 10 MG tablet, Take 1 tablet (10 mg total) by mouth daily., Disp: 90 tablet, Rfl: 1 .  fexofenadine (ALLEGRA) 180 MG tablet, Take 1 tablet (180 mg total) by mouth daily. (Patient taking differently: Take 180 mg by mouth daily as needed. ), Disp: 90 tablet, Rfl: 1 .  fluticasone (FLONASE) 50 MCG/ACT nasal spray, Place 2 sprays daily into both nostrils., Disp: 48 g, Rfl: 1 .  fluticasone furoate-vilanterol (BREO ELLIPTA) 200-25 MCG/INH AEPB, Inhale 1 puff into the lungs daily., Disp: 60 each, Rfl: 5 .  gabapentin (NEURONTIN) 300 MG capsule, Take 4 capsules (1,200 mg total) by mouth at bedtime., Disp: 120 capsule, Rfl: 5 .  glucose blood (CONTOUR NEXT TEST) test strip, To check blood sugar once daily, Disp: 100 each, Rfl: 12 .  ipratropium-albuterol (DUONEB) 0.5-2.5 (3) MG/3ML SOLN, Take 3 mLs by nebulization every 6 (six) hours as needed., Disp: 360 mL, Rfl: 11 .  levothyroxine (SYNTHROID, LEVOTHROID) 75 MCG tablet, Take 1 tablet (75 mcg total) by mouth daily., Disp: 90 tablet, Rfl: 1 .  losartan (COZAAR) 50 MG tablet, Take 1 tablet (50 mg total) by mouth daily., Disp: 90 tablet, Rfl: 1 .  metFORMIN (GLUCOPHAGE-XR) 500 MG 24 hr tablet, Take 2 tablets (1,000 mg total) by mouth daily with supper., Disp: 60 tablet, Rfl:  3 .  predniSONE (DELTASONE) 10 MG tablet, Take 6 tabs PO on day 1&2, 5 tabs PO on day 3&4, 4 tabs PO on day 5&6, 3 tabs PO on day 7&8, 2 tabs PO on day 9&10, 1 tab PO on day 11&12., Disp: 42 tablet, Rfl: 0 .  theophylline (THEO-24) 300 MG 24 hr capsule, Take 1 capsule (300 mg total) by mouth daily., Disp: 90 capsule, Rfl: 1 .  tiZANidine (ZANAFLEX) 4 MG capsule, Take 1 capsule (4 mg total) by mouth 3 (three) times daily., Disp: 90 capsule, Rfl: 6 .  Vitamin D, Ergocalciferol, (DRISDOL) 50000 units CAPS capsule, Take 1 capsule (50,000 Units total) by mouth every 7  (seven) days., Disp: 12 capsule, Rfl: 3  Review of Systems  Constitutional: Negative.   Respiratory: Negative.   Cardiovascular: Negative.   Gastrointestinal: Negative.   Skin: Positive for rash.  Neurological: Negative.     Social History   Tobacco Use  . Smoking status: Current Every Day Smoker    Packs/day: 1.00    Years: 45.00    Pack years: 45.00    Types: Cigarettes    Start date: 03/02/1980  . Smokeless tobacco: Never Used  . Tobacco comment: every other day  Substance Use Topics  . Alcohol use: No    Alcohol/week: 0.0 oz   Objective:   BP 140/80 (BP Location: Left Arm, Patient Position: Sitting, Cuff Size: Large)   Pulse 84   Temp 98.3 F (36.8 C) (Oral)   Resp 16   Wt 220 lb (99.8 kg)   SpO2 95%   BMI 31.12 kg/m  Vitals:   08/28/17 0809  BP: 140/80  Pulse: 84  Resp: 16  Temp: 98.3 F (36.8 C)  TempSrc: Oral  SpO2: 95%  Weight: 220 lb (99.8 kg)     Physical Exam  Constitutional: She appears well-developed and well-nourished. No distress.  Pulmonary/Chest: Effort normal and breath sounds normal.  Skin: She is not diaphoretic. No erythema.     Vitals reviewed.       Assessment & Plan:     1. Cat bite, subsequent encounter Almost completely healed. Advised to continue augmentin until completed. Call if symptoms worsen.       Mar Daring, PA-C  [facility]

## 2017-09-03 ENCOUNTER — Other Ambulatory Visit: Payer: Self-pay | Admitting: Physician Assistant

## 2017-09-03 DIAGNOSIS — F419 Anxiety disorder, unspecified: Secondary | ICD-10-CM

## 2017-09-04 NOTE — Telephone Encounter (Signed)
Called into [facility]

## 2017-09-05 ENCOUNTER — Telehealth: Payer: Self-pay | Admitting: Physician Assistant

## 2017-09-05 DIAGNOSIS — T3695XA Adverse effect of unspecified systemic antibiotic, initial encounter: Principal | ICD-10-CM

## 2017-09-05 DIAGNOSIS — B379 Candidiasis, unspecified: Secondary | ICD-10-CM

## 2017-09-05 MED ORDER — FLUCONAZOLE 150 MG PO TABS: 150.0000 mg | ORAL_TABLET | Freq: Once | ORAL | 0 refills | Status: AC

## 2017-09-05 NOTE — Telephone Encounter (Signed)
Patient needs refill on Diflucan (been taking antibiotic prescribed by Tawanna Sat) to [facility].

## 2017-09-05 NOTE — Telephone Encounter (Signed)
Sent!

## 2017-09-13 ENCOUNTER — Encounter: Payer: Self-pay | Admitting: Physician Assistant

## 2017-09-13 ENCOUNTER — Ambulatory Visit (INDEPENDENT_AMBULATORY_CARE_PROVIDER_SITE_OTHER): Payer: BLUE CROSS/BLUE SHIELD | Admitting: Physician Assistant

## 2017-09-13 VITALS — BP 130/80 | HR 98 | Temp 98.0°F | Resp 16 | Wt 221.0 lb

## 2017-09-13 DIAGNOSIS — R350 Frequency of micturition: Secondary | ICD-10-CM

## 2017-09-13 LAB — POCT URINALYSIS DIPSTICK
Bilirubin, UA: NEGATIVE
Blood, UA: NEGATIVE
Glucose, UA: NEGATIVE
Ketones, UA: NEGATIVE
Leukocytes, UA: NEGATIVE
Nitrite, UA: NEGATIVE
Protein, UA: NEGATIVE
Spec Grav, UA: 1.01 (ref 1.010–1.025)
Urobilinogen, UA: 0.2 E.U./dL
pH, UA: 6 (ref 5.0–8.0)

## 2017-09-13 MED ORDER — SULFAMETHOXAZOLE-TRIMETHOPRIM 800-160 MG PO TABS: 1.0000 | ORAL_TABLET | Freq: Two times a day (BID) | ORAL | 0 refills | Status: DC

## 2017-09-13 NOTE — Progress Notes (Signed)
Patient: Linda Bradford Female    DOB: 1956-01-31   62 y.o.   MRN: [id number] Visit Date: 09/13/2017  Today's Provider: Mar Daring, PA-C   Chief Complaint  Patient presents with  . Follow-up  . Urinary Tract Infection   Subjective:    HPI Patient here today for FMLA forms to be filled out for acute illnesses.  Patient C/O possible UTI, pt reports lower back pain, abdominal pain, and frequent urination x's 4 days. Patient denies any fever, abnormal vaginal discharge or blood in urine.     Allergies  Allergen Reactions  . Dexilant [Dexlansoprazole] Nausea Only and Other (See Comments)    Dizziness     Current Outpatient Medications:  .  acetaminophen (TYLENOL) 500 MG tablet, Take 1,000 mg by mouth every 6 (six) hours as needed., Disp: , Rfl:  .  albuterol (PROVENTIL HFA;VENTOLIN HFA) 108 (90 Base) MCG/ACT inhaler, Inhale 1 puff into the lungs every 4 (four) hours as needed., Disp: 1 Inhaler, Rfl: 11 .  benzonatate (TESSALON) 200 MG capsule, Take 1 capsule (200 mg total) 3 (three) times daily as needed by mouth for cough., Disp: 90 capsule, Rfl: 3 .  clonazePAM (KLONOPIN) 0.5 MG tablet, TAKE 1/2 TABLET BY MOUTH TWICE DAILY AS NEEDED, Disp: 60 tablet, Rfl: 5 .  Cyanocobalamin (RA VITAMIN B-12 TR) 1000 MCG TBCR, Take 1 tablet by mouth daily. , Disp: , Rfl:  .  diclofenac sodium (VOLTAREN) 1 % GEL, Apply 4 g topically 4 (four) times daily as needed. , Disp: , Rfl:  .  DULoxetine (CYMBALTA) 60 MG capsule, Take 1 capsule (60 mg total) 2 (two) times daily by mouth., Disp: 60 capsule, Rfl: 6 .  ezetimibe (ZETIA) 10 MG tablet, Take 1 tablet (10 mg total) by mouth daily., Disp: 90 tablet, Rfl: 1 .  fexofenadine (ALLEGRA) 180 MG tablet, Take 1 tablet (180 mg total) by mouth daily. (Patient taking differently: Take 180 mg by mouth daily as needed. ), Disp: 90 tablet, Rfl: 1 .  fluticasone (FLONASE) 50 MCG/ACT nasal spray, Place 2 sprays daily into both nostrils., Disp: 48 g, Rfl:  1 .  fluticasone furoate-vilanterol (BREO ELLIPTA) 200-25 MCG/INH AEPB, Inhale 1 puff into the lungs daily., Disp: 60 each, Rfl: 5 .  gabapentin (NEURONTIN) 300 MG capsule, Take 4 capsules (1,200 mg total) by mouth at bedtime., Disp: 120 capsule, Rfl: 5 .  glucose blood (CONTOUR NEXT TEST) test strip, To check blood sugar once daily, Disp: 100 each, Rfl: 12 .  ipratropium-albuterol (DUONEB) 0.5-2.5 (3) MG/3ML SOLN, Take 3 mLs by nebulization every 6 (six) hours as needed., Disp: 360 mL, Rfl: 11 .  levothyroxine (SYNTHROID, LEVOTHROID) 75 MCG tablet, Take 1 tablet (75 mcg total) by mouth daily., Disp: 90 tablet, Rfl: 1 .  losartan (COZAAR) 50 MG tablet, Take 1 tablet (50 mg total) by mouth daily., Disp: 90 tablet, Rfl: 1 .  metFORMIN (GLUCOPHAGE-XR) 500 MG 24 hr tablet, Take 2 tablets (1,000 mg total) by mouth daily with supper., Disp: 60 tablet, Rfl: 3 .  theophylline (THEO-24) 300 MG 24 hr capsule, Take 1 capsule (300 mg total) by mouth daily., Disp: 90 capsule, Rfl: 1 .  tiZANidine (ZANAFLEX) 4 MG capsule, Take 1 capsule (4 mg total) by mouth 3 (three) times daily., Disp: 90 capsule, Rfl: 6 .  Vitamin D, Ergocalciferol, (DRISDOL) 50000 units CAPS capsule, Take 1 capsule (50,000 Units total) by mouth every 7 (seven) days., Disp: 12 capsule, Rfl: 3  Review  of Systems  Constitutional: Positive for activity change.  Respiratory: Negative.   Cardiovascular: Negative.   Gastrointestinal: Positive for abdominal distention and abdominal pain.  Genitourinary: Positive for flank pain, frequency and pelvic pain.  Neurological: Negative.     Social History   Tobacco Use  . Smoking status: Current Every Day Smoker    Packs/day: 1.00    Years: 45.00    Pack years: 45.00    Types: Cigarettes    Start date: 03/02/1980  . Smokeless tobacco: Never Used  . Tobacco comment: every other day  Substance Use Topics  . Alcohol use: No    Alcohol/week: 0.0 oz   Objective:   BP 130/80 (BP Location: Left  Arm, Patient Position: Sitting, Cuff Size: Large)   Pulse 98   Temp 98 F (36.7 C) (Oral)   Resp 16   Wt 221 lb (100.2 kg)   SpO2 96%   BMI 31.26 kg/m  Vitals:   09/13/17 1600  BP: 130/80  Pulse: 98  Resp: 16  Temp: 98 F (36.7 C)  TempSrc: Oral  SpO2: 96%  Weight: 221 lb (100.2 kg)     Physical Exam  Constitutional: She is oriented to person, place, and time. She appears well-developed and well-nourished. No distress.  Cardiovascular: Normal rate, regular rhythm and normal heart sounds. Exam reveals no gallop and no friction rub.  No murmur heard. Pulmonary/Chest: Effort normal and breath sounds normal. No respiratory distress. She has no wheezes. She has no rales.  Abdominal: Soft. Normal appearance and bowel sounds are normal. She exhibits no distension and no mass. There is no hepatosplenomegaly. There is tenderness in the suprapubic area. There is no rebound, no guarding and no CVA tenderness.  Neurological: She is alert and oriented to person, place, and time.  Skin: Skin is warm and dry. She is not diaphoretic.        Assessment & Plan:     1. Frequency of urination UA was normal. Will send urine for culture and treat empirically with Bactrim as below. If culture is negative and patient is still having symptoms she will return for pelvic exam. I will f/u with her pending C&S results. Push fluids. May use AZO tabs for bladder spasm.  - POCT urinalysis dipstick - sulfamethoxazole-trimethoprim (BACTRIM DS,SEPTRA DS) 800-160 MG tablet; Take 1 tablet by mouth 2 (two) times daily.  Dispense: 14 tablet; Refill: 0 - Urine Culture       Mar Daring, PA-C  [facility]

## 2017-09-15 ENCOUNTER — Telehealth: Payer: Self-pay

## 2017-09-15 LAB — URINE CULTURE

## 2017-09-15 NOTE — Telephone Encounter (Signed)
-----   Message from Mar Daring, PA-C sent at 09/15/2017  8:16 AM EST ----- Urine culture has no growth. Are symptoms improving with the antibiotic?

## 2017-09-15 NOTE — Telephone Encounter (Signed)
Patient advised as directed below.  Thanks,  -Klint Lezcano 

## 2017-09-18 ENCOUNTER — Telehealth: Payer: Self-pay

## 2017-09-18 NOTE — Telephone Encounter (Signed)
Left message regarding her paper work.  Thanks,  -Joseline

## 2017-09-19 NOTE — Telephone Encounter (Signed)
Patient advised that FMLA paper work is placed up front ready for pick up.Copy of papers was made to scan in patient's chart. Forms faxed to [facility] [phone] on 09/18/17.  Thanks,  -Marisa Hage

## 2017-10-13 ENCOUNTER — Encounter: Payer: Self-pay | Admitting: Physician Assistant

## 2017-10-13 ENCOUNTER — Ambulatory Visit (INDEPENDENT_AMBULATORY_CARE_PROVIDER_SITE_OTHER): Payer: BLUE CROSS/BLUE SHIELD | Admitting: Physician Assistant

## 2017-10-13 VITALS — BP 142/78 | HR 94 | Temp 98.0°F | Resp 18 | Wt 220.0 lb

## 2017-10-13 DIAGNOSIS — R399 Unspecified symptoms and signs involving the genitourinary system: Secondary | ICD-10-CM | POA: Diagnosis not present

## 2017-10-13 DIAGNOSIS — E119 Type 2 diabetes mellitus without complications: Secondary | ICD-10-CM | POA: Diagnosis not present

## 2017-10-13 DIAGNOSIS — R350 Frequency of micturition: Secondary | ICD-10-CM | POA: Diagnosis not present

## 2017-10-13 DIAGNOSIS — M2550 Pain in unspecified joint: Secondary | ICD-10-CM | POA: Diagnosis not present

## 2017-10-13 DIAGNOSIS — F331 Major depressive disorder, recurrent, moderate: Secondary | ICD-10-CM | POA: Diagnosis not present

## 2017-10-13 LAB — POCT GLYCOSYLATED HEMOGLOBIN (HGB A1C)
Est. average glucose Bld gHb Est-mCnc: 171
Hemoglobin A1C: 7.6

## 2017-10-13 MED ORDER — SULFAMETHOXAZOLE-TRIMETHOPRIM 800-160 MG PO TABS: 1.0000 | ORAL_TABLET | Freq: Two times a day (BID) | ORAL | 0 refills | Status: DC

## 2017-10-13 MED ORDER — MELOXICAM 15 MG PO TABS: 15.0000 mg | ORAL_TABLET | Freq: Every day | ORAL | 1 refills | Status: DC

## 2017-10-13 NOTE — Progress Notes (Signed)
Patient: Linda Bradford Female    DOB: [birth year]   62 y.o.   MRN: [id number] Visit Date: 10/13/2017  Today's Provider: Mar Daring, PA-C   Chief Complaint  Patient presents with  . Fatigue  . Nausea   Subjective:    HPI Patient comes in today c/o nausea, fatigue, and back pain. She reports that she has had symptoms X 2 days. She reports that she also has urinary frequency that seems to be getting worse. She has been taking AZO since last night.    She also mentions that she has been under a lot of stress lately, and has had a difficult time sleeping at night. This has worsened over the last week. She reports her son starting using drugs again and was found passed out in a bathroom at a [employer] with drug paraphernalia, which violated his probation and he is back in jail at this time.   She also reports worsening arthralgias again. She is interested in restarting meloxicam. This had been stopped in the past due to not being as effective as it had been.    Allergies  Allergen Reactions  . Dexilant [Dexlansoprazole] Nausea Only and Other (See Comments)    Dizziness     Current Outpatient Medications:  .  acetaminophen (TYLENOL) 500 MG tablet, Take 1,000 mg by mouth every 6 (six) hours as needed., Disp: , Rfl:  .  albuterol (PROVENTIL HFA;VENTOLIN HFA) 108 (90 Base) MCG/ACT inhaler, Inhale 1 puff into the lungs every 4 (four) hours as needed., Disp: 1 Inhaler, Rfl: 11 .  benzonatate (TESSALON) 200 MG capsule, Take 1 capsule (200 mg total) 3 (three) times daily as needed by mouth for cough., Disp: 90 capsule, Rfl: 3 .  clonazePAM (KLONOPIN) 0.5 MG tablet, TAKE 1/2 TABLET BY MOUTH TWICE DAILY AS NEEDED, Disp: 60 tablet, Rfl: 5 .  Cyanocobalamin (RA VITAMIN B-12 TR) 1000 MCG TBCR, Take 1 tablet by mouth daily. , Disp: , Rfl:  .  diclofenac sodium (VOLTAREN) 1 % GEL, Apply 4 g topically 4 (four) times daily as needed. , Disp: , Rfl:  .  DULoxetine (CYMBALTA) 60 MG  capsule, Take 1 capsule (60 mg total) 2 (two) times daily by mouth., Disp: 60 capsule, Rfl: 6 .  ezetimibe (ZETIA) 10 MG tablet, Take 1 tablet (10 mg total) by mouth daily., Disp: 90 tablet, Rfl: 1 .  fexofenadine (ALLEGRA) 180 MG tablet, Take 1 tablet (180 mg total) by mouth daily. (Patient taking differently: Take 180 mg by mouth daily as needed. ), Disp: 90 tablet, Rfl: 1 .  fluticasone (FLONASE) 50 MCG/ACT nasal spray, Place 2 sprays daily into both nostrils., Disp: 48 g, Rfl: 1 .  fluticasone furoate-vilanterol (BREO ELLIPTA) 200-25 MCG/INH AEPB, Inhale 1 puff into the lungs daily., Disp: 60 each, Rfl: 5 .  gabapentin (NEURONTIN) 300 MG capsule, Take 4 capsules (1,200 mg total) by mouth at bedtime., Disp: 120 capsule, Rfl: 5 .  glucose blood (CONTOUR NEXT TEST) test strip, To check blood sugar once daily, Disp: 100 each, Rfl: 12 .  ipratropium-albuterol (DUONEB) 0.5-2.5 (3) MG/3ML SOLN, Take 3 mLs by nebulization every 6 (six) hours as needed., Disp: 360 mL, Rfl: 11 .  levothyroxine (SYNTHROID, LEVOTHROID) 75 MCG tablet, Take 1 tablet (75 mcg total) by mouth daily., Disp: 90 tablet, Rfl: 1 .  losartan (COZAAR) 50 MG tablet, Take 1 tablet (50 mg total) by mouth daily., Disp: 90 tablet, Rfl: 1 .  metFORMIN (GLUCOPHAGE-XR) 500  MG 24 hr tablet, Take 2 tablets (1,000 mg total) by mouth daily with supper., Disp: 60 tablet, Rfl: 3 .  theophylline (THEO-24) 300 MG 24 hr capsule, Take 1 capsule (300 mg total) by mouth daily., Disp: 90 capsule, Rfl: 1 .  tiZANidine (ZANAFLEX) 4 MG capsule, Take 1 capsule (4 mg total) by mouth 3 (three) times daily., Disp: 90 capsule, Rfl: 6 .  Vitamin D, Ergocalciferol, (DRISDOL) 50000 units CAPS capsule, Take 1 capsule (50,000 Units total) by mouth every 7 (seven) days., Disp: 12 capsule, Rfl: 3 .  sulfamethoxazole-trimethoprim (BACTRIM DS,SEPTRA DS) 800-160 MG tablet, Take 1 tablet by mouth 2 (two) times daily. (Patient not taking: Reported on 10/13/2017), Disp: 14 tablet,  Rfl: 0  Review of Systems  Constitutional: Positive for activity change, appetite change, chills and fatigue.  Cardiovascular: Negative for chest pain, palpitations and leg swelling.  Gastrointestinal: Positive for abdominal pain and nausea. Negative for anal bleeding, blood in stool, constipation and diarrhea.  Genitourinary: Positive for frequency. Negative for difficulty urinating, dysuria, hematuria, pelvic pain, vaginal bleeding, vaginal discharge and vaginal pain.  Musculoskeletal: Positive for arthralgias and myalgias.  Neurological: Positive for light-headedness and headaches.  Psychiatric/Behavioral: Positive for sleep disturbance. The patient is nervous/anxious.     Social History   Tobacco Use  . Smoking status: Current Every Day Smoker    Packs/day: 1.00    Years: 45.00    Pack years: 45.00    Types: Cigarettes    Start date: 03/02/1980  . Smokeless tobacco: Never Used  . Tobacco comment: every other day  Substance Use Topics  . Alcohol use: No    Alcohol/week: 0.0 oz   Objective:   BP (!) 142/78 (BP Location: Right Arm, Patient Position: Sitting, Cuff Size: Large)   Pulse 94   Temp 98 F (36.7 C)   Resp 18   Wt 220 lb (99.8 kg)   SpO2 93%   BMI 31.12 kg/m  Vitals:   10/13/17 1105  BP: (!) 142/78  Pulse: 94  Resp: 18  Temp: 98 F (36.7 C)  SpO2: 93%  Weight: 220 lb (99.8 kg)     Physical Exam  Constitutional: She is oriented to person, place, and time. She appears well-developed and well-nourished. No distress.  Cardiovascular: Normal rate, regular rhythm and normal heart sounds. Exam reveals no gallop and no friction rub.  No murmur heard. Pulmonary/Chest: Effort normal and breath sounds normal. No respiratory distress. She has no wheezes. She has no rales.  Abdominal: Soft. Normal appearance and bowel sounds are normal. She exhibits no distension and no mass. There is no hepatosplenomegaly. There is tenderness in the suprapubic area. There is CVA  tenderness (right side). There is no rebound and no guarding.  Neurological: She is alert and oriented to person, place, and time.  Skin: Skin is warm and dry. She is not diaphoretic.  Vitals reviewed.      Assessment & Plan:     1. Diabetes mellitus without complication (HCC) Y7C improved to 7.6 from 7.9.  - POCT HgB A1C  2. Depression, major, recurrent, moderate (HCC) Worsening. Continue duloxetine. Clonazepam for prn use. Patient is to call if not sleeping by next week and will consider adding trazodone.   3. UTI symptoms Worsening symptoms. Patient unable to void today. Will treat empirically with Bactrim as below. Pyridium given for spasm. Continue to push fluids. Urine sent for culture. Will follow up pending C&S results. She is to call if symptoms do not improve or  if they worsen.  - sulfamethoxazole-trimethoprim (BACTRIM DS,SEPTRA DS) 800-160 MG tablet; Take 1 tablet by mouth 2 (two) times daily.  Dispense: 20 tablet; Refill: 0  4. Urine frequency See above medical treatment plan. - POCT HgB A1C - sulfamethoxazole-trimethoprim (BACTRIM DS,SEPTRA DS) 800-160 MG tablet; Take 1 tablet by mouth 2 (two) times daily.  Dispense: 20 tablet; Refill: 0  5. Arthralgia, unspecified joint Stable. Diagnosis pulled for medication refill. Continue current medical treatment plan. - meloxicam (MOBIC) 15 MG tablet; Take 1 tablet (15 mg total) by mouth daily.  Dispense: 90 tablet; Refill: [place], PA-C  [facility]

## 2017-10-16 ENCOUNTER — Telehealth: Payer: Self-pay

## 2017-10-16 DIAGNOSIS — B379 Candidiasis, unspecified: Secondary | ICD-10-CM

## 2017-10-16 NOTE — Telephone Encounter (Signed)
Patient is requesting a note stating that she was seen on Friday 10/13/2017. Please call when ready.

## 2017-10-17 ENCOUNTER — Encounter: Payer: Self-pay | Admitting: Physician Assistant

## 2017-10-17 NOTE — Telephone Encounter (Signed)
Please review. Thanks!  

## 2017-10-17 NOTE — Telephone Encounter (Signed)
Note printed.

## 2017-10-18 MED ORDER — FLUCONAZOLE 150 MG PO TABS: 150.0000 mg | ORAL_TABLET | Freq: Once | ORAL | 0 refills | Status: AC

## 2017-10-18 NOTE — Telephone Encounter (Signed)
Sent in diflucan

## 2017-10-18 NOTE — Telephone Encounter (Signed)
Patient advised letter is ready. Patient reports that she has a yeast infection again. Reports that she uses [place].  Thanks,  Joseline

## 2017-10-18 NOTE — Addendum Note (Signed)
Addended by: Mar Daring on: 10/18/2017 09:47 AM   Modules accepted: Orders

## 2017-10-26 ENCOUNTER — Other Ambulatory Visit: Payer: Self-pay | Admitting: Physician Assistant

## 2017-10-26 DIAGNOSIS — I10 Essential (primary) hypertension: Secondary | ICD-10-CM

## 2017-11-27 ENCOUNTER — Ambulatory Visit (INDEPENDENT_AMBULATORY_CARE_PROVIDER_SITE_OTHER): Payer: BLUE CROSS/BLUE SHIELD | Admitting: Physician Assistant

## 2017-11-27 ENCOUNTER — Encounter: Payer: Self-pay | Admitting: Physician Assistant

## 2017-11-27 ENCOUNTER — Other Ambulatory Visit: Payer: Self-pay

## 2017-11-27 VITALS — BP 124/72 | HR 93 | Wt 224.0 lb

## 2017-11-27 DIAGNOSIS — E119 Type 2 diabetes mellitus without complications: Secondary | ICD-10-CM

## 2017-11-27 DIAGNOSIS — R110 Nausea: Secondary | ICD-10-CM

## 2017-11-27 DIAGNOSIS — Z716 Tobacco abuse counseling: Secondary | ICD-10-CM | POA: Diagnosis not present

## 2017-11-27 DIAGNOSIS — J4541 Moderate persistent asthma with (acute) exacerbation: Secondary | ICD-10-CM

## 2017-11-27 LAB — POCT GLYCOSYLATED HEMOGLOBIN (HGB A1C): Hemoglobin A1C: 7.2

## 2017-11-27 MED ORDER — ONDANSETRON HCL 4 MG PO TABS: 4.0000 mg | ORAL_TABLET | Freq: Three times a day (TID) | ORAL | 0 refills | Status: DC | PRN

## 2017-11-27 MED ORDER — DEXTROMETHORPHAN-GUAIFENESIN 5-100 MG/5ML PO LIQD: 5.0000 mL | Freq: Two times a day (BID) | ORAL | 3 refills | Status: DC

## 2017-11-27 MED ORDER — VARENICLINE TARTRATE 0.5 MG X 11 & 1 MG X 42 PO MISC: ORAL | 0 refills | Status: DC

## 2017-11-27 NOTE — Progress Notes (Signed)
Patient: Linda Bradford Female    DOB: [birth year]   62 y.o.   MRN: [id number] Visit Date: 11/27/2017  Today's Provider: Mar Daring, PA-C   Chief Complaint  Patient presents with  . Diabetes    follow up   Subjective:    Diabetes  She presents for her follow-up diabetic visit. She has type 2 diabetes mellitus. Her disease course has been stable. Hypoglycemia symptoms include headaches. Symptoms are stable. Risk factors for coronary artery disease include tobacco exposure and post-menopausal. Current diabetic treatment includes oral agent (monotherapy). Her weight is fluctuating minimally. When asked about meal planning, she reported none. She participates in exercise intermittently. (Fluctuate between 106-180)   Lab Results  Component Value Date   HGBA1C 7.2 11/27/2017   HGBA1C 7.6 10/13/2017   HGBA1C 7.9 08/25/2017   Lab Results  Component Value Date   LDLCALC 167 (H) 05/19/2017   CREATININE 0.58 05/19/2017      Allergies  Allergen Reactions  . Dexilant [Dexlansoprazole] Nausea Only and Other (See Comments)    Dizziness     Current Outpatient Medications:  .  acetaminophen (TYLENOL) 500 MG tablet, Take 1,000 mg by mouth every 6 (six) hours as needed., Disp: , Rfl:  .  albuterol (PROVENTIL HFA;VENTOLIN HFA) 108 (90 Base) MCG/ACT inhaler, Inhale 1 puff into the lungs every 4 (four) hours as needed., Disp: 1 Inhaler, Rfl: 11 .  benzonatate (TESSALON) 200 MG capsule, Take 1 capsule (200 mg total) 3 (three) times daily as needed by mouth for cough., Disp: 90 capsule, Rfl: 3 .  clonazePAM (KLONOPIN) 0.5 MG tablet, TAKE 1/2 TABLET BY MOUTH TWICE DAILY AS NEEDED, Disp: 60 tablet, Rfl: 5 .  Cyanocobalamin (RA VITAMIN B-12 TR) 1000 MCG TBCR, Take 1 tablet by mouth daily. , Disp: , Rfl:  .  diclofenac sodium (VOLTAREN) 1 % GEL, Apply 4 g topically 4 (four) times daily as needed. , Disp: , Rfl:  .  DULoxetine (CYMBALTA) 60 MG capsule, Take 1 capsule (60 mg total) 2 (two)  times daily by mouth., Disp: 60 capsule, Rfl: 6 .  ezetimibe (ZETIA) 10 MG tablet, Take 1 tablet (10 mg total) by mouth daily., Disp: 90 tablet, Rfl: 1 .  fexofenadine (ALLEGRA) 180 MG tablet, Take 1 tablet (180 mg total) by mouth daily. (Patient taking differently: Take 180 mg by mouth daily as needed. ), Disp: 90 tablet, Rfl: 1 .  fluticasone (FLONASE) 50 MCG/ACT nasal spray, Place 2 sprays daily into both nostrils., Disp: 48 g, Rfl: 1 .  fluticasone furoate-vilanterol (BREO ELLIPTA) 200-25 MCG/INH AEPB, Inhale 1 puff into the lungs daily., Disp: 60 each, Rfl: 5 .  gabapentin (NEURONTIN) 300 MG capsule, Take 4 capsules (1,200 mg total) by mouth at bedtime., Disp: 120 capsule, Rfl: 5 .  glucose blood (CONTOUR NEXT TEST) test strip, To check blood sugar once daily, Disp: 100 each, Rfl: 12 .  ipratropium-albuterol (DUONEB) 0.5-2.5 (3) MG/3ML SOLN, Take 3 mLs by nebulization every 6 (six) hours as needed., Disp: 360 mL, Rfl: 11 .  levothyroxine (SYNTHROID, LEVOTHROID) 75 MCG tablet, Take 1 tablet (75 mcg total) by mouth daily., Disp: 90 tablet, Rfl: 1 .  losartan (COZAAR) 50 MG tablet, TAKE 1 TABLET BY MOUTH ONCE DAILY, Disp: 90 tablet, Rfl: 2 .  meloxicam (MOBIC) 15 MG tablet, Take 1 tablet (15 mg total) by mouth daily., Disp: 90 tablet, Rfl: 1 .  metFORMIN (GLUCOPHAGE-XR) 500 MG 24 hr tablet, Take 2 tablets (1,000 mg total)  by mouth daily with supper., Disp: 60 tablet, Rfl: 3 .  theophylline (THEO-24) 300 MG 24 hr capsule, Take 1 capsule (300 mg total) by mouth daily., Disp: 90 capsule, Rfl: 1 .  tiZANidine (ZANAFLEX) 4 MG capsule, Take 1 capsule (4 mg total) by mouth 3 (three) times daily., Disp: 90 capsule, Rfl: 6 .  Vitamin D, Ergocalciferol, (DRISDOL) 50000 units CAPS capsule, Take 1 capsule (50,000 Units total) by mouth every 7 (seven) days., Disp: 12 capsule, Rfl: 3  Review of Systems  Respiratory: Positive for cough. Negative for chest tightness, shortness of breath and wheezing.     Cardiovascular: Negative.   Gastrointestinal: Positive for nausea.  Genitourinary: Negative.   Allergic/Immunologic: Positive for environmental allergies.  Neurological: Positive for headaches.  All other systems reviewed and are negative.   Social History   Tobacco Use  . Smoking status: Current Every Day Smoker    Packs/day: 1.00    Years: 45.00    Pack years: 45.00    Types: Cigarettes    Start date: 03/02/1980  . Smokeless tobacco: Never Used  . Tobacco comment: every other day  Substance Use Topics  . Alcohol use: No    Alcohol/week: 0.0 oz   Objective:   BP 124/72 (BP Location: Left Arm, Patient Position: Sitting, Cuff Size: Large)   Pulse 93   Wt 224 lb (101.6 kg)   SpO2 94%   BMI 31.69 kg/m  Vitals:   11/27/17 1625  BP: 124/72  Pulse: 93  SpO2: 94%  Weight: 224 lb (101.6 kg)     Physical Exam  Constitutional: She appears well-developed and well-nourished. No distress.  Neck: Normal range of motion. Neck supple. No JVD present. No tracheal deviation present. No thyromegaly present.  Cardiovascular: Normal rate, regular rhythm and normal heart sounds. Exam reveals no gallop and no friction rub.  No murmur heard. Pulmonary/Chest: Effort normal. No respiratory distress. She has no wheezes. She has rhonchi in the right upper field, the right middle field and the left upper field. She has no rales.  Lymphadenopathy:    She has no cervical adenopathy.  Skin: She is not diaphoretic.  Vitals reviewed.      Assessment & Plan:     1. Diabetes mellitus without complication (HCC) F7P improved to 7.2 from 7.6. Continue metformin XR 500mg  (2 tabs- 1000mg  total). I will see her back in 3 months for A1c recheck.  - POCT glycosylated hemoglobin (Hb A1C)  2. Nausea Occurs with hypoglycemic episodes and occasionally just as side effect from metformin. Zofran sent in as below.  - ondansetron (ZOFRAN) 4 MG tablet; Take 1 tablet (4 mg total) by mouth every 8 (eight) hours  as needed.  Dispense: 20 tablet; Refill: 0  3. Moderate persistent asthma with acute exacerbation Stable. Diagnosis pulled for medication refill. Continue current medical treatment plan. - Dextromethorphan-guaiFENesin (Radnor FAST-MAX DM MAX) 5-100 MG/5ML LIQD; Take 5 mLs by mouth 2 (two) times daily.  Dispense: 180 mL; Refill: 3  4. Encounter for smoking cessation counseling Wants to try Chantix again for smoking cessation. Tips to quit smoking printed on AVS for patient.  - varenicline (CHANTIX PAK) 0.5 MG X 11 & 1 MG X 42 tablet; Take one 0.5 mg tab PO once daily for 3 days, then increase to one 0.5 mg tablet twice daily for 4 days, then increase to one 1 mg tab BID.  Dispense: 53 tablet; Refill: Muse, PA-C  [facility]

## 2017-11-27 NOTE — Patient Instructions (Signed)
Steps to Quit Smoking Smoking tobacco can be harmful to your health and can affect almost every organ in your body. Smoking puts you, and those around you, at risk for developing many serious chronic diseases. Quitting smoking is difficult, but it is one of the best things that you can do for your health. It is never too late to quit. What are the benefits of quitting smoking? When you quit smoking, you lower your risk of developing serious diseases and conditions, such as:  Lung cancer or lung disease, such as COPD.  Heart disease.  Stroke.  Heart attack.  Infertility.  Osteoporosis and bone fractures.  Additionally, symptoms such as coughing, wheezing, and shortness of breath may get better when you quit. You may also find that you get sick less often because your body is stronger at fighting off colds and infections. If you are pregnant, quitting smoking can help to reduce your chances of having a baby of low birth weight. How do I get ready to quit? When you decide to quit smoking, create a plan to make sure that you are successful. Before you quit:  Pick a date to quit. Set a date within the next two weeks to give you time to prepare.  Write down the reasons why you are quitting. Keep this list in places where you will see it often, such as on your bathroom mirror or in your car or wallet.  Identify the people, places, things, and activities that make you want to smoke (triggers) and avoid them. Make sure to take these actions: ? Throw away all cigarettes at home, at work, and in your car. ? Throw away smoking accessories, such as ashtrays and lighters. ? Clean your car and make sure to empty the ashtray. ? Clean your home, including curtains and carpets.  Tell your family, friends, and coworkers that you are quitting. Support from your loved ones can make quitting easier.  Talk with your health care provider about your options for quitting smoking.  Find out what treatment  options are covered by your health insurance.  What strategies can I use to quit smoking? Talk with your healthcare provider about different strategies to quit smoking. Some strategies include:  Quitting smoking altogether instead of gradually lessening how much you smoke over a period of time. Research shows that quitting "cold turkey" is more successful than gradually quitting.  Attending in-person counseling to help you build problem-solving skills. You are more likely to have success in quitting if you attend several counseling sessions. Even short sessions of 10 minutes can be effective.  Finding resources and support systems that can help you to quit smoking and remain smoke-free after you quit. These resources are most helpful when you use them often. They can include: ? Online chats with a counselor. ? Telephone quitlines. ? Printed self-help materials. ? Support groups or group counseling. ? Text messaging programs. ? Mobile phone applications.  Taking medicines to help you quit smoking. (If you are pregnant or breastfeeding, talk with your health care provider first.) Some medicines contain nicotine and some do not. Both types of medicines help with cravings, but the medicines that include nicotine help to relieve withdrawal symptoms. Your health care provider may recommend: ? Nicotine patches, gum, or lozenges. ? Nicotine inhalers or sprays. ? Non-nicotine medicine that is taken by mouth.  Talk with your health care provider about combining strategies, such as taking medicines while you are also receiving in-person counseling. Using these two strategies together   makes you more likely to succeed in quitting than if you used either strategy on its own. If you are pregnant or breastfeeding, talk with your health care provider about finding counseling or other support strategies to quit smoking. Do not take medicine to help you quit smoking unless told to do so by your health care  provider. What things can I do to make it easier to quit? Quitting smoking might feel overwhelming at first, but there is a lot that you can do to make it easier. Take these important actions:  Reach out to your family and friends and ask that they support and encourage you during this time. Call telephone quitlines, reach out to support groups, or work with a counselor for support.  Ask people who smoke to avoid smoking around you.  Avoid places that trigger you to smoke, such as bars, parties, or smoke-break areas at work.  Spend time around people who do not smoke.  Lessen stress in your life, because stress can be a smoking trigger for some people. To lessen stress, try: ? Exercising regularly. ? Deep-breathing exercises. ? Yoga. ? Meditating. ? Performing a body scan. This involves closing your eyes, scanning your body from head to toe, and noticing which parts of your body are particularly tense. Purposefully relax the muscles in those areas.  Download or purchase mobile phone or tablet apps (applications) that can help you stick to your quit plan by providing reminders, tips, and encouragement. There are many free apps, such as QuitGuide from the CDC (Centers for Disease Control and Prevention). You can find other support for quitting smoking (smoking cessation) through smokefree.gov and other websites.  How will I feel when I quit smoking? Within the first 24 hours of quitting smoking, you may start to feel some withdrawal symptoms. These symptoms are usually most noticeable 2-3 days after quitting, but they usually do not last beyond 2-3 weeks. Changes or symptoms that you might experience include:  Mood swings.  Restlessness, anxiety, or irritation.  Difficulty concentrating.  Dizziness.  Strong cravings for sugary foods in addition to nicotine.  Mild weight gain.  Constipation.  Nausea.  Coughing or a sore throat.  Changes in how your medicines work in your  body.  A depressed mood.  Difficulty sleeping (insomnia).  After the first 2-3 weeks of quitting, you may start to notice more positive results, such as:  Improved sense of smell and taste.  Decreased coughing and sore throat.  Slower heart rate.  Lower blood pressure.  Clearer skin.  The ability to breathe more easily.  Fewer sick days.  Quitting smoking is very challenging for most people. Do not get discouraged if you are not successful the first time. Some people need to make many attempts to quit before they achieve long-term success. Do your best to stick to your quit plan, and talk with your health care provider if you have any questions or concerns. This information is not intended to replace advice given to you by your health care provider. Make sure you discuss any questions you have with your health care provider. Document Released: 07/12/2001 Document Revised: 03/15/2016 Document Reviewed: 12/02/2014 Elsevier Interactive Patient Education  2018 Elsevier Inc.  

## 2017-12-01 ENCOUNTER — Other Ambulatory Visit: Payer: Self-pay | Admitting: Physician Assistant

## 2017-12-01 DIAGNOSIS — E559 Vitamin D deficiency, unspecified: Secondary | ICD-10-CM

## 2017-12-01 DIAGNOSIS — G2581 Restless legs syndrome: Secondary | ICD-10-CM

## 2017-12-01 DIAGNOSIS — J449 Chronic obstructive pulmonary disease, unspecified: Secondary | ICD-10-CM

## 2017-12-01 DIAGNOSIS — E7800 Pure hypercholesterolemia, unspecified: Secondary | ICD-10-CM

## 2017-12-20 DIAGNOSIS — L570 Actinic keratosis: Secondary | ICD-10-CM | POA: Diagnosis not present

## 2017-12-20 DIAGNOSIS — Z85828 Personal history of other malignant neoplasm of skin: Secondary | ICD-10-CM | POA: Diagnosis not present

## 2017-12-20 DIAGNOSIS — Z08 Encounter for follow-up examination after completed treatment for malignant neoplasm: Secondary | ICD-10-CM | POA: Diagnosis not present

## 2017-12-20 DIAGNOSIS — X32XXXA Exposure to sunlight, initial encounter: Secondary | ICD-10-CM | POA: Diagnosis not present

## 2017-12-28 ENCOUNTER — Encounter: Payer: Self-pay | Admitting: Physician Assistant

## 2017-12-28 ENCOUNTER — Ambulatory Visit (INDEPENDENT_AMBULATORY_CARE_PROVIDER_SITE_OTHER): Payer: BLUE CROSS/BLUE SHIELD | Admitting: Physician Assistant

## 2017-12-28 ENCOUNTER — Other Ambulatory Visit: Payer: Self-pay | Admitting: Physician Assistant

## 2017-12-28 ENCOUNTER — Ambulatory Visit
Admission: RE | Admit: 2017-12-28 | Discharge: 2017-12-28 | Disposition: A | Discharge status: Home / Self Care | Payer: BLUE CROSS/BLUE SHIELD | Source: Ambulatory Visit | Attending: Physician Assistant | Admitting: Physician Assistant

## 2017-12-28 VITALS — BP 148/82 | HR 84 | Temp 98.5°F | Resp 16 | Wt 223.0 lb

## 2017-12-28 DIAGNOSIS — R51 Headache: Secondary | ICD-10-CM | POA: Insufficient documentation

## 2017-12-28 DIAGNOSIS — E119 Type 2 diabetes mellitus without complications: Secondary | ICD-10-CM

## 2017-12-28 DIAGNOSIS — G4452 New daily persistent headache (NDPH): Secondary | ICD-10-CM

## 2017-12-28 DIAGNOSIS — I10 Essential (primary) hypertension: Secondary | ICD-10-CM

## 2017-12-28 DIAGNOSIS — R519 Headache, unspecified: Secondary | ICD-10-CM

## 2017-12-28 NOTE — Patient Instructions (Addendum)

## 2017-12-28 NOTE — Progress Notes (Signed)
Patient: Linda Bradford Female    DOB: April 25, 1956   62 y.o.   MRN: [id number] Visit Date: 12/29/2017  Today's Provider: Trinna Post, PA-C   Chief Complaint  Patient presents with  . Hypertension  . Headache   Subjective:    Linda Bradford is a 62 y/o woman with history of COPD actively using tobacco, HTN, DM II presenting today with several days of headache. She does not normally get headaches. This is different from her occasional headache. It is left sided, over her eye, nagging pain, relieved temporarily with tylenol. Not associated with vision change, weakness, confusion, loss of balance. No N/v.  Hypertension  This is a chronic problem. The problem is uncontrolled. Associated symptoms include headaches and shortness of breath. Pertinent negatives include no anxiety, blurred vision, chest pain, malaise/fatigue, neck pain, orthopnea, palpitations, peripheral edema or PND. There are no associated agents to hypertension. There are no compliance problems.   Headache   This is a new problem. The current episode started in the past 7 days. The problem occurs constantly. The problem has been unchanged. The pain is located in the left unilateral (Also Bilateral) region. The pain quality is similar to prior headaches. The quality of the pain is described as dull and aching. Associated symptoms include coughing, numbness and phonophobia. Pertinent negatives include no abdominal pain, blurred vision, dizziness, ear pain, eye pain, eye redness, eye watering, fever, nausea, neck pain, photophobia, sinus pressure or vomiting. Her past medical history is significant for hypertension.       Allergies  Allergen Reactions  . Dexilant [Dexlansoprazole] Nausea Only and Other (See Comments)    Dizziness     Current Outpatient Medications:  .  acetaminophen (TYLENOL) 500 MG tablet, Take 1,000 mg by mouth every 6 (six) hours as needed., Disp: , Rfl:  .  albuterol (PROVENTIL HFA;VENTOLIN HFA) 108  (90 Base) MCG/ACT inhaler, Inhale 1 puff into the lungs every 4 (four) hours as needed., Disp: 1 Inhaler, Rfl: 11 .  benzonatate (TESSALON) 200 MG capsule, Take 1 capsule (200 mg total) 3 (three) times daily as needed by mouth for cough., Disp: 90 capsule, Rfl: 3 .  clonazePAM (KLONOPIN) 0.5 MG tablet, TAKE 1/2 TABLET BY MOUTH TWICE DAILY AS NEEDED, Disp: 60 tablet, Rfl: 5 .  Cyanocobalamin (RA VITAMIN B-12 TR) 1000 MCG TBCR, Take 1 tablet by mouth daily. , Disp: , Rfl:  .  Dextromethorphan-guaiFENesin (Emily FAST-MAX DM MAX) 5-100 MG/5ML LIQD, Take 5 mLs by mouth 2 (two) times daily., Disp: 180 mL, Rfl: 3 .  diclofenac sodium (VOLTAREN) 1 % GEL, Apply 4 g topically 4 (four) times daily as needed. , Disp: , Rfl:  .  DULoxetine (CYMBALTA) 60 MG capsule, Take 1 capsule (60 mg total) 2 (two) times daily by mouth., Disp: 60 capsule, Rfl: 6 .  ezetimibe (ZETIA) 10 MG tablet, TAKE 1 TABLET BY MOUTH ONCE DAILY, Disp: 90 tablet, Rfl: 1 .  fexofenadine (ALLEGRA) 180 MG tablet, Take 1 tablet (180 mg total) by mouth daily. (Patient taking differently: Take 180 mg by mouth daily as needed. ), Disp: 90 tablet, Rfl: 1 .  fluticasone (FLONASE) 50 MCG/ACT nasal spray, Place 2 sprays daily into both nostrils., Disp: 48 g, Rfl: 1 .  fluticasone furoate-vilanterol (BREO ELLIPTA) 200-25 MCG/INH AEPB, Inhale 1 puff into the lungs daily., Disp: 60 each, Rfl: 5 .  gabapentin (NEURONTIN) 300 MG capsule, TAKE 4 CAPSULES BY MOUTH AT BEDTIME, Disp: 360 capsule, Rfl: 1 .  glucose blood (CONTOUR NEXT TEST) test strip, To check blood sugar once daily, Disp: 100 each, Rfl: 12 .  ipratropium-albuterol (DUONEB) 0.5-2.5 (3) MG/3ML SOLN, Take 3 mLs by nebulization every 6 (six) hours as needed., Disp: 360 mL, Rfl: 11 .  levothyroxine (SYNTHROID, LEVOTHROID) 75 MCG tablet, Take 1 tablet (75 mcg total) by mouth daily., Disp: 90 tablet, Rfl: 1 .  losartan (COZAAR) 50 MG tablet, TAKE 1 TABLET BY MOUTH ONCE DAILY, Disp: 90 tablet, Rfl:  2 .  meloxicam (MOBIC) 15 MG tablet, Take 1 tablet (15 mg total) by mouth daily., Disp: 90 tablet, Rfl: 1 .  metFORMIN (GLUCOPHAGE-XR) 500 MG 24 hr tablet, TAKE 2 TABLETS BY MOUTH ONCE DAILY WITH SUPPER, Disp: 60 tablet, Rfl: 5 .  ondansetron (ZOFRAN) 4 MG tablet, Take 1 tablet (4 mg total) by mouth every 8 (eight) hours as needed., Disp: 20 tablet, Rfl: 0 .  THEO-24 300 MG 24 hr capsule, TAKE 1 CAPSULE BY MOUTH ONCE DAILY, Disp: 90 capsule, Rfl: 1 .  tiZANidine (ZANAFLEX) 4 MG capsule, Take 1 capsule (4 mg total) by mouth 3 (three) times daily., Disp: 90 capsule, Rfl: 6 .  Vitamin D, Ergocalciferol, (DRISDOL) 50000 units CAPS capsule, TAKE 1 CAPSULE BY MOUTH EVERY 7 DAYS, Disp: 12 capsule, Rfl: 3 .  varenicline (CHANTIX PAK) 0.5 MG X 11 & 1 MG X 42 tablet, Take one 0.5 mg tab PO once daily for 3 days, then increase to one 0.5 mg tablet twice daily for 4 days, then increase to one 1 mg tab BID. (Patient not taking: Reported on 12/28/2017), Disp: 53 tablet, Rfl: 0  Review of Systems  Constitutional: Negative for activity change, appetite change, chills, diaphoresis, fatigue, fever, malaise/fatigue and unexpected weight change.  HENT: Negative for ear pain, sinus pressure and sinus pain.   Eyes: Negative for blurred vision, photophobia, pain, discharge, redness, itching and visual disturbance.  Respiratory: Positive for cough, shortness of breath and wheezing.        History of COPD; pt states her symptoms are stable.   Cardiovascular: Negative for chest pain, palpitations, orthopnea, leg swelling and PND.  Gastrointestinal: Negative.  Negative for abdominal pain, nausea and vomiting.  Musculoskeletal: Negative for neck pain.  Neurological: Positive for numbness and headaches. Negative for dizziness, speech difficulty and light-headedness.  Psychiatric/Behavioral: Positive for decreased concentration.    Social History   Tobacco Use  . Smoking status: Current Every Day Smoker    Packs/day:  1.00    Years: 45.00    Pack years: 45.00    Types: Cigarettes    Start date: 03/02/1980  . Smokeless tobacco: Never Used  . Tobacco comment: every other day  Substance Use Topics  . Alcohol use: No    Alcohol/week: 0.0 oz   Objective:   BP (!) 148/82 (BP Location: Left Arm, Patient Position: Sitting, Cuff Size: Large)   Pulse 84   Temp 98.5 F (36.9 C) (Oral)   Resp 16   Wt 223 lb (101.2 kg)   BMI 31.54 kg/m  Vitals:   12/28/17 1544  BP: (!) 148/82  Pulse: 84  Resp: 16  Temp: 98.5 F (36.9 C)  TempSrc: Oral  Weight: 223 lb (101.2 kg)     Physical Exam  Constitutional: She is oriented to person, place, and time. She appears well-developed and well-nourished.  Eyes: Pupils are equal, round, and reactive to light. EOM are normal.  Cardiovascular: Normal rate and regular rhythm.  Pulmonary/Chest: Effort normal and breath sounds normal.  Neurological: She is alert and oriented to person, place, and time. She has normal strength. She is not disoriented. She displays normal reflexes. No cranial nerve deficit or sensory deficit. Coordination and gait normal. GCS eye subscore is 4. GCS verbal subscore is 5. GCS motor subscore is 6.  Skin: Skin is warm and dry.  Psychiatric: She has a normal mood and affect. Her behavior is normal.        Assessment & Plan:     1. New daily persistent headache  Will get CT due to age, new/different headache, and comorbidities including HTN, DM, and smoking.  2. Hypertension, unspecified type   3. Acute nonintractable headache, unspecified headache type - CT Head Wo Contrast  Return if symptoms worsen or fail to improve.  The entirety of the information documented in the History of Present Illness, Review of Systems and Physical Exam were personally obtained by me. Portions of this information were initially documented by Ashley Royalty, CMA and reviewed by me for thoroughness and accuracy.         Trinna Post, PA-C  [facility]

## 2018-01-24 DIAGNOSIS — M170 Bilateral primary osteoarthritis of knee: Secondary | ICD-10-CM | POA: Diagnosis not present

## 2018-01-25 ENCOUNTER — Telehealth: Payer: Self-pay

## 2018-01-25 ENCOUNTER — Other Ambulatory Visit: Payer: Self-pay | Admitting: Physician Assistant

## 2018-01-25 DIAGNOSIS — J418 Mixed simple and mucopurulent chronic bronchitis: Secondary | ICD-10-CM

## 2018-01-25 NOTE — Telephone Encounter (Signed)
Patient called to report that she is having headache and blood sugar was 354. Patient reports she did forget to take Metformin last night so she did take her 2 tablets this morning. Patient rechecked blood sugar and reports it is down to 338. Patient reports she has taken Tylenol for headache, patient denies other symptoms.   Per Sonia Baller patient to take Metformin again tonight. And to call tomorrow if symptoms do not improve.   Patient verbalizes understanding and is in agreement with treatment plan.

## 2018-01-26 ENCOUNTER — Telehealth: Payer: Self-pay | Admitting: Physician Assistant

## 2018-01-26 NOTE — Telephone Encounter (Signed)
Pt Talked to nurse yesterday about her glucose level being high and metformin medication.  She still has questions about the doses of metformin.  She did say she is feeling better and glucose as went down some  Pt's call back is [phone]  Con Memos

## 2018-01-26 NOTE — Telephone Encounter (Signed)
Patient advised to continue taking metformin regular dose. Patient reports FBS was 170 this morning. Sonia Baller advised.

## 2018-02-19 ENCOUNTER — Other Ambulatory Visit: Payer: Self-pay | Admitting: Physician Assistant

## 2018-02-23 ENCOUNTER — Other Ambulatory Visit: Payer: Self-pay | Admitting: Physician Assistant

## 2018-02-23 DIAGNOSIS — F329 Major depressive disorder, single episode, unspecified: Secondary | ICD-10-CM

## 2018-02-23 DIAGNOSIS — E039 Hypothyroidism, unspecified: Secondary | ICD-10-CM

## 2018-02-23 DIAGNOSIS — F419 Anxiety disorder, unspecified: Secondary | ICD-10-CM

## 2018-02-23 DIAGNOSIS — F32A Depression, unspecified: Secondary | ICD-10-CM

## 2018-03-01 ENCOUNTER — Ambulatory Visit (INDEPENDENT_AMBULATORY_CARE_PROVIDER_SITE_OTHER): Payer: BLUE CROSS/BLUE SHIELD | Admitting: Physician Assistant

## 2018-03-01 ENCOUNTER — Encounter: Payer: Self-pay | Admitting: Physician Assistant

## 2018-03-01 VITALS — BP 130/80 | HR 93 | Temp 98.2°F | Resp 16 | Wt 218.2 lb

## 2018-03-01 DIAGNOSIS — M1712 Unilateral primary osteoarthritis, left knee: Secondary | ICD-10-CM

## 2018-03-01 DIAGNOSIS — E119 Type 2 diabetes mellitus without complications: Secondary | ICD-10-CM

## 2018-03-01 DIAGNOSIS — G44229 Chronic tension-type headache, not intractable: Secondary | ICD-10-CM | POA: Diagnosis not present

## 2018-03-01 DIAGNOSIS — M25552 Pain in left hip: Secondary | ICD-10-CM

## 2018-03-01 DIAGNOSIS — M25551 Pain in right hip: Secondary | ICD-10-CM | POA: Diagnosis not present

## 2018-03-01 LAB — POCT GLYCOSYLATED HEMOGLOBIN (HGB A1C)
Est. average glucose Bld gHb Est-mCnc: 160
Hemoglobin A1C: 7.2 % — AB (ref 4.0–5.6)

## 2018-03-01 MED ORDER — METFORMIN HCL ER 500 MG PO TB24: ORAL_TABLET | ORAL | 1 refills | Status: DC

## 2018-03-01 MED ORDER — BUTALBITAL-APAP-CAFFEINE 50-325-40 MG PO TABS: 1.0000 | ORAL_TABLET | Freq: Four times a day (QID) | ORAL | 0 refills | Status: DC | PRN

## 2018-03-01 MED ORDER — DICLOFENAC SODIUM 1 % TD GEL: 4.0000 g | Freq: Four times a day (QID) | TRANSDERMAL | 1 refills | Status: DC | PRN

## 2018-03-01 NOTE — Progress Notes (Signed)
Patient: Linda Bradford Female    DOB: [birth year]   62 y.o.   MRN: [id number] Visit Date: 03/01/2018  Today's Provider: Mar Daring, PA-C   Chief Complaint  Patient presents with  . Follow-up    Diabetes   Subjective:    HPI  Diabetes Mellitus Type II, Follow-up:   Lab Results  Component Value Date   HGBA1C 7.2 (A) 03/01/2018   HGBA1C 7.2 11/27/2017   HGBA1C 7.6 10/13/2017    Last seen for diabetes 3 months ago.  Management since then includes none. She reports excellent compliance with treatment. She is not having side effects.  Current symptoms include weight loss tingling and numbness on her toes and have been stable. Home blood sugar records: 120's-130's fasting in the AM  Episodes of hypoglycemia? yes    Current Insulin Regimen: n/a Most Recent Eye Exam:Last December Weight trend: decreasing steadily. Current diet: in general, an "unhealthy" diet Current exercise: walking  Pertinent Labs:    Component Value Date/Time   CHOL 265 (H) 05/19/2017 0823   TRIG 204 (H) 05/19/2017 0823   HDL 62 05/19/2017 0823   LDLCALC 167 (H) 05/19/2017 0823   CREATININE 0.58 05/19/2017 0823    Wt Readings from Last 3 Encounters:  03/01/18 218 lb 3.2 oz (99 kg)  12/28/17 223 lb (101.2 kg)  11/27/17 224 lb (101.6 kg)   ------------------------------------------------------------------------ Headache: Reports that she has been having headache off and on. Sometimes she wakes up with the headache. Recently she has been having like piercing headache in her temporal area. This week she reports that she hasn't had any.     Allergies  Allergen Reactions  . Dexilant [Dexlansoprazole] Nausea Only and Other (See Comments)    Dizziness     Current Outpatient Medications:  .  acetaminophen (TYLENOL) 500 MG tablet, Take 1,000 mg by mouth every 6 (six) hours as needed., Disp: , Rfl:  .  albuterol (PROVENTIL HFA;VENTOLIN HFA) 108 (90 Base) MCG/ACT inhaler, Inhale 1 puff  into the lungs every 4 (four) hours as needed., Disp: 1 Inhaler, Rfl: 11 .  benzonatate (TESSALON) 200 MG capsule, Take 1 capsule (200 mg total) 3 (three) times daily as needed by mouth for cough., Disp: 90 capsule, Rfl: 3 .  BREO ELLIPTA 200-25 MCG/INH AEPB, INHALE 1 PUFF BY MOUTH ONCE DAILY, Disp: 60 each, Rfl: 5 .  clonazePAM (KLONOPIN) 0.5 MG tablet, TAKE 1/2 TABLET BY MOUTH TWICE DAILY AS NEEDED, Disp: 180 tablet, Rfl: 1 .  Cyanocobalamin (RA VITAMIN B-12 TR) 1000 MCG TBCR, Take 1 tablet by mouth daily. , Disp: , Rfl:  .  Dextromethorphan-guaiFENesin ([place] FAST-MAX DM MAX) 5-100 MG/5ML LIQD, Take 5 mLs by mouth 2 (two) times daily., Disp: 180 mL, Rfl: 3 .  diclofenac sodium (VOLTAREN) 1 % GEL, Apply 4 g topically 4 (four) times daily as needed. , Disp: , Rfl:  .  DULoxetine (CYMBALTA) 60 MG capsule, TAKE 1 CAPSULE BY MOUTH TWICE DAILY, Disp: 180 capsule, Rfl: 1 .  ezetimibe (ZETIA) 10 MG tablet, TAKE 1 TABLET BY MOUTH ONCE DAILY, Disp: 90 tablet, Rfl: 1 .  fexofenadine (ALLEGRA) 180 MG tablet, Take 1 tablet (180 mg total) by mouth daily. (Patient taking differently: Take 180 mg by mouth daily as needed. ), Disp: 90 tablet, Rfl: 1 .  fluticasone (FLONASE) 50 MCG/ACT nasal spray, Place 2 sprays daily into both nostrils., Disp: 48 g, Rfl: 1 .  gabapentin (NEURONTIN) 300 MG capsule, TAKE 4 CAPSULES BY  MOUTH AT BEDTIME, Disp: 360 capsule, Rfl: 1 .  glucose blood (CONTOUR NEXT TEST) test strip, To check blood sugar once daily, Disp: 100 each, Rfl: 12 .  ipratropium-albuterol (DUONEB) 0.5-2.5 (3) MG/3ML SOLN, Take 3 mLs by nebulization every 6 (six) hours as needed., Disp: 360 mL, Rfl: 11 .  levothyroxine (SYNTHROID, LEVOTHROID) 75 MCG tablet, TAKE 1 TABLET BY MOUTH ONCE DAILY ON AN EMPTY STOMACH. WAIT 30 MINUTES BEFORE TAKING OTHER MEDS., Disp: 90 tablet, Rfl: 1 .  losartan (COZAAR) 50 MG tablet, TAKE 1 TABLET BY MOUTH ONCE DAILY, Disp: 90 tablet, Rfl: 2 .  meloxicam (MOBIC) 15 MG tablet, Take 1  tablet (15 mg total) by mouth daily., Disp: 90 tablet, Rfl: 1 .  metFORMIN (GLUCOPHAGE-XR) 500 MG 24 hr tablet, TAKE 2 TABLETS BY MOUTH ONCE DAILY WITH SUPPER, Disp: 60 tablet, Rfl: 5 .  ondansetron (ZOFRAN) 4 MG tablet, Take 1 tablet (4 mg total) by mouth every 8 (eight) hours as needed., Disp: 20 tablet, Rfl: 0 .  THEO-24 300 MG 24 hr capsule, TAKE 1 CAPSULE BY MOUTH ONCE DAILY, Disp: 90 capsule, Rfl: 1 .  tiZANidine (ZANAFLEX) 4 MG capsule, TAKE 1 CAPSULE BY MOUTH 3 TIMES DAILY, Disp: 90 capsule, Rfl: 6 .  Vitamin D, Ergocalciferol, (DRISDOL) 50000 units CAPS capsule, TAKE 1 CAPSULE BY MOUTH EVERY 7 DAYS, Disp: 12 capsule, Rfl: 3 .  varenicline (CHANTIX PAK) 0.5 MG X 11 & 1 MG X 42 tablet, Take one 0.5 mg tab PO once daily for 3 days, then increase to one 0.5 mg tablet twice daily for 4 days, then increase to one 1 mg tab BID. (Patient not taking: Reported on 12/28/2017), Disp: 53 tablet, Rfl: 0  Review of Systems  Constitutional: Negative.   Respiratory: Positive for cough and wheezing. Negative for chest tightness and shortness of breath.        Chronic  Cardiovascular: Negative.   Gastrointestinal: Negative.   Neurological: Positive for numbness and headaches. Negative for weakness.  Psychiatric/Behavioral: Negative.     Social History   Tobacco Use  . Smoking status: Current Every Day Smoker    Packs/day: 1.00    Years: 45.00    Pack years: 45.00    Types: Cigarettes    Start date: 03/02/1980  . Smokeless tobacco: Never Used  . Tobacco comment: every other day  Substance Use Topics  . Alcohol use: No    Alcohol/week: 0.0 oz   Objective:   BP 130/80 (BP Location: Left Arm, Patient Position: Sitting, Cuff Size: Normal)   Pulse 93   Temp 98.2 F (36.8 C) (Oral)   Resp 16   Wt 218 lb 3.2 oz (99 kg)   SpO2 94%   BMI 30.87 kg/m  Vitals:   03/01/18 1613  BP: 130/80  Pulse: 93  Resp: 16  Temp: 98.2 F (36.8 C)  TempSrc: Oral  SpO2: 94%  Weight: 218 lb 3.2 oz (99 kg)      Physical Exam  Constitutional: She appears well-developed and well-nourished. No distress.  Neck: Normal range of motion. Neck supple. No JVD present. No tracheal deviation present. No thyromegaly present.  Cardiovascular: Normal rate, regular rhythm and normal heart sounds. Exam reveals no gallop and no friction rub.  No murmur heard. Pulses:      Dorsalis pedis pulses are 2+ on the right side, and 2+ on the left side.       Posterior tibial pulses are 2+ on the right side, and 2+ on the  left side.  Pulmonary/Chest: Effort normal and breath sounds normal. No respiratory distress. She has no wheezes. She has no rales.  Musculoskeletal:       Right foot: There is normal range of motion and no deformity.       Left foot: There is normal range of motion and no deformity.  Feet:  Right Foot:  Protective Sensation: 10 sites tested. 10 sites sensed.  Skin Integrity: Negative for ulcer, blister, skin breakdown, erythema, warmth, callus or dry skin.  Left Foot:  Protective Sensation: 10 sites tested. 10 sites sensed.  Skin Integrity: Negative for ulcer, blister, skin breakdown, erythema, warmth, callus or dry skin.  Lymphadenopathy:    She has no cervical adenopathy.  Skin: She is not diaphoretic.  Vitals reviewed.       Assessment & Plan:     1. Diabetes mellitus without complication (HCC) R0Q stable at 7.2. Continue metformin as below. Having worse neuropathy sensation of burning in her feet but still sensory intact. May consider increasing gabapentin and adding a daytime dose when she returns in 3 months if no improvement. She is hoping cutting back to part-time work may help. I will see her back in 3 months.  - POCT glycosylated hemoglobin (Hb A1C) - metFORMIN (GLUCOPHAGE-XR) 500 MG 24 hr tablet; TAKE 2 TABLETS BY MOUTH ONCE DAILY WITH SUPPER  Dispense: 180 tablet; Refill: 1  2. Chronic tension-type headache, not intractable Start Fioricet prn for headaches. CT was normal. Call  if symptoms worsen.  - butalbital-acetaminophen-caffeine (FIORICET, ESGIC) 50-325-40 MG tablet; Take 1-2 tablets by mouth every 6 (six) hours as needed for headache.  Dispense: 20 tablet; Refill: 0  3. Pain of both hip joints Stable. Diagnosis pulled for medication refill. Continue current medical treatment plan. - diclofenac sodium (VOLTAREN) 1 % GEL; Apply 4 g topically 4 (four) times daily as needed.  Dispense: 100 g; Refill: 1  4. Arthritis of knee, left Stable. Diagnosis pulled for medication refill. Continue current medical treatment plan. - diclofenac sodium (VOLTAREN) 1 % GEL; Apply 4 g topically 4 (four) times daily as needed.  Dispense: 100 g; Refill: [place], PA-C  [facility]

## 2018-03-13 ENCOUNTER — Telehealth: Payer: Self-pay

## 2018-03-13 ENCOUNTER — Other Ambulatory Visit: Payer: Self-pay | Admitting: Physician Assistant

## 2018-03-13 DIAGNOSIS — G2581 Restless legs syndrome: Secondary | ICD-10-CM

## 2018-03-13 MED ORDER — GABAPENTIN 300 MG PO CAPS: 1200.0000 mg | ORAL_CAPSULE | Freq: Every day | ORAL | 1 refills | Status: DC

## 2018-03-13 NOTE — Telephone Encounter (Signed)
Changed to [facility] address noted below.

## 2018-03-13 NOTE — Telephone Encounter (Signed)
Pt needs a refill on her Neurontin 300mg .  She only has enough for 2 days and she will be in [place] until the weekend  She is out of state in [place]  She needs it sent to [place], [place].   Pharmacy [phone]  CB# [phone]  Thanks Con Memos

## 2018-03-13 NOTE — Telephone Encounter (Signed)
LM advising patient  °

## 2018-03-13 NOTE — Telephone Encounter (Signed)
[facility] and put Gabapentin prescription on hold since patient wants it to be send to [facility] in [place], [place].

## 2018-03-13 NOTE — Telephone Encounter (Signed)
Patient called to check on refill for gabapentin that needs to be sent into [place]. I checked and it was sent to [facility]. Patient would like that be transferred to [facility] in [place].  CB# [phone]

## 2018-03-21 ENCOUNTER — Other Ambulatory Visit: Payer: Self-pay | Admitting: Physician Assistant

## 2018-03-21 DIAGNOSIS — G44229 Chronic tension-type headache, not intractable: Secondary | ICD-10-CM

## 2018-03-26 DIAGNOSIS — S66911A Strain of unspecified muscle, fascia and tendon at wrist and hand level, right hand, initial encounter: Secondary | ICD-10-CM | POA: Diagnosis not present

## 2018-03-28 ENCOUNTER — Other Ambulatory Visit: Payer: Self-pay | Admitting: Physician Assistant

## 2018-03-28 ENCOUNTER — Encounter: Payer: Self-pay | Admitting: Physician Assistant

## 2018-03-28 ENCOUNTER — Ambulatory Visit (INDEPENDENT_AMBULATORY_CARE_PROVIDER_SITE_OTHER): Payer: BLUE CROSS/BLUE SHIELD | Admitting: Physician Assistant

## 2018-03-28 VITALS — BP 138/62 | HR 83 | Temp 97.6°F | Resp 18 | Wt 216.6 lb

## 2018-03-28 DIAGNOSIS — G2581 Restless legs syndrome: Secondary | ICD-10-CM | POA: Diagnosis not present

## 2018-03-28 DIAGNOSIS — J440 Chronic obstructive pulmonary disease with acute lower respiratory infection: Secondary | ICD-10-CM

## 2018-03-28 DIAGNOSIS — J209 Acute bronchitis, unspecified: Secondary | ICD-10-CM

## 2018-03-28 DIAGNOSIS — T3695XA Adverse effect of unspecified systemic antibiotic, initial encounter: Secondary | ICD-10-CM

## 2018-03-28 DIAGNOSIS — B379 Candidiasis, unspecified: Secondary | ICD-10-CM | POA: Diagnosis not present

## 2018-03-28 DIAGNOSIS — M2550 Pain in unspecified joint: Secondary | ICD-10-CM

## 2018-03-28 MED ORDER — AMOXICILLIN-POT CLAVULANATE 875-125 MG PO TABS: 1.0000 | ORAL_TABLET | Freq: Two times a day (BID) | ORAL | 0 refills | Status: DC

## 2018-03-28 MED ORDER — FLUCONAZOLE 150 MG PO TABS: 150.0000 mg | ORAL_TABLET | Freq: Once | ORAL | 0 refills | Status: AC

## 2018-03-28 MED ORDER — PREDNISONE 10 MG PO TABS: ORAL_TABLET | ORAL | 0 refills | Status: DC

## 2018-03-28 MED ORDER — GABAPENTIN 300 MG PO CAPS: ORAL_CAPSULE | ORAL | 1 refills | Status: DC

## 2018-03-28 MED ORDER — HYDROCODONE-HOMATROPINE 5-1.5 MG/5ML PO SYRP: 5.0000 mL | ORAL_SOLUTION | Freq: Three times a day (TID) | ORAL | 0 refills | Status: DC | PRN

## 2018-03-28 NOTE — Progress Notes (Addendum)
Patient: Linda Bradford Female    DOB: 1956-07-19   62 y.o.   MRN: [id number] Visit Date: 03/28/2018  Today's Provider: Mar Daring, PA-C   Chief Complaint  Patient presents with  . URI   Subjective:    URI   This is a new problem. The current episode started 1 to 4 weeks ago (Last Friday). The problem has been gradually worsening. There has been no fever. Associated symptoms include congestion, coughing, rhinorrhea, sinus pain and wheezing. Pertinent negatives include no chest pain, ear pain, headaches, plugged ear sensation or sore throat. Associated symptoms comments: "Scratchy throat'. She has tried inhaler use, antihistamine and decongestant (Nebulizer and left over Tessalon and some Mucinex. Nasal spray) for the symptoms. The treatment provided no relief.      Allergies  Allergen Reactions  . Dexilant [Dexlansoprazole] Nausea Only and Other (See Comments)    Dizziness     Current Outpatient Medications:  .  acetaminophen (TYLENOL) 500 MG tablet, Take 1,000 mg by mouth every 6 (six) hours as needed., Disp: , Rfl:  .  albuterol (PROVENTIL HFA;VENTOLIN HFA) 108 (90 Base) MCG/ACT inhaler, Inhale 1 puff into the lungs every 4 (four) hours as needed., Disp: 1 Inhaler, Rfl: 11 .  benzonatate (TESSALON) 200 MG capsule, Take 1 capsule (200 mg total) 3 (three) times daily as needed by mouth for cough., Disp: 90 capsule, Rfl: 3 .  BREO ELLIPTA 200-25 MCG/INH AEPB, INHALE 1 PUFF BY MOUTH ONCE DAILY, Disp: 60 each, Rfl: 5 .  butalbital-acetaminophen-caffeine (FIORICET, ESGIC) 50-325-40 MG tablet, Take 1 tablet by mouth every 6 (six) hours as needed for headache., Disp: 30 tablet, Rfl: 5 .  clonazePAM (KLONOPIN) 0.5 MG tablet, TAKE 1/2 TABLET BY MOUTH TWICE DAILY AS NEEDED, Disp: 180 tablet, Rfl: 1 .  Cyanocobalamin (RA VITAMIN B-12 TR) 1000 MCG TBCR, Take 1 tablet by mouth daily. , Disp: , Rfl:  .  diclofenac sodium (VOLTAREN) 1 % GEL, Apply 4 g topically 4 (four) times daily as  needed., Disp: 100 g, Rfl: 1 .  DULoxetine (CYMBALTA) 60 MG capsule, TAKE 1 CAPSULE BY MOUTH TWICE DAILY, Disp: 180 capsule, Rfl: 1 .  ezetimibe (ZETIA) 10 MG tablet, TAKE 1 TABLET BY MOUTH ONCE DAILY, Disp: 90 tablet, Rfl: 1 .  fexofenadine (ALLEGRA) 180 MG tablet, Take 1 tablet (180 mg total) by mouth daily. (Patient taking differently: Take 180 mg by mouth daily as needed. ), Disp: 90 tablet, Rfl: 1 .  fluticasone (FLONASE) 50 MCG/ACT nasal spray, Place 2 sprays daily into both nostrils., Disp: 48 g, Rfl: 1 .  gabapentin (NEURONTIN) 300 MG capsule, Take 4 capsules (1,200 mg total) by mouth at bedtime., Disp: 360 capsule, Rfl: 1 .  glucose blood (CONTOUR NEXT TEST) test strip, To check blood sugar once daily, Disp: 100 each, Rfl: 12 .  ipratropium-albuterol (DUONEB) 0.5-2.5 (3) MG/3ML SOLN, Take 3 mLs by nebulization every 6 (six) hours as needed., Disp: 360 mL, Rfl: 11 .  levothyroxine (SYNTHROID, LEVOTHROID) 75 MCG tablet, TAKE 1 TABLET BY MOUTH ONCE DAILY ON AN EMPTY STOMACH. WAIT 30 MINUTES BEFORE TAKING OTHER MEDS., Disp: 90 tablet, Rfl: 1 .  losartan (COZAAR) 50 MG tablet, TAKE 1 TABLET BY MOUTH ONCE DAILY, Disp: 90 tablet, Rfl: 2 .  meloxicam (MOBIC) 15 MG tablet, TAKE 1 TABLET BY MOUTH ONCE DAILY., Disp: 90 tablet, Rfl: 1 .  metFORMIN (GLUCOPHAGE-XR) 500 MG 24 hr tablet, TAKE 2 TABLETS BY MOUTH ONCE DAILY WITH SUPPER, Disp:  180 tablet, Rfl: 1 .  THEO-24 300 MG 24 hr capsule, TAKE 1 CAPSULE BY MOUTH ONCE DAILY, Disp: 90 capsule, Rfl: 1 .  tiZANidine (ZANAFLEX) 4 MG capsule, TAKE 1 CAPSULE BY MOUTH 3 TIMES DAILY, Disp: 90 capsule, Rfl: 6 .  Vitamin D, Ergocalciferol, (DRISDOL) 50000 units CAPS capsule, TAKE 1 CAPSULE BY MOUTH EVERY 7 DAYS, Disp: 12 capsule, Rfl: 3 .  Dextromethorphan-guaiFENesin ([place] FAST-MAX DM MAX) 5-100 MG/5ML LIQD, Take 5 mLs by mouth 2 (two) times daily. (Patient not taking: Reported on 03/28/2018), Disp: 180 mL, Rfl: 3 .  ondansetron (ZOFRAN) 4 MG tablet, Take 1  tablet (4 mg total) by mouth every 8 (eight) hours as needed. (Patient not taking: Reported on 03/28/2018), Disp: 20 tablet, Rfl: 0 .  varenicline (CHANTIX PAK) 0.5 MG X 11 & 1 MG X 42 tablet, Take one 0.5 mg tab PO once daily for 3 days, then increase to one 0.5 mg tablet twice daily for 4 days, then increase to one 1 mg tab BID. (Patient not taking: Reported on 12/28/2017), Disp: 53 tablet, Rfl: 0  Review of Systems  Constitutional: Negative for chills and fever.  HENT: Positive for congestion, postnasal drip, rhinorrhea, sinus pressure and sinus pain. Negative for ear pain and sore throat.   Respiratory: Positive for cough, shortness of breath and wheezing. Negative for chest tightness.   Cardiovascular: Negative for chest pain, palpitations and leg swelling.  Neurological: Negative for headaches.    Social History   Tobacco Use  . Smoking status: Current Every Day Smoker    Packs/day: 1.00    Years: 45.00    Pack years: 45.00    Types: Cigarettes    Start date: 03/02/1980  . Smokeless tobacco: Never Used  . Tobacco comment: every other day  Substance Use Topics  . Alcohol use: No    Alcohol/week: 0.0 standard drinks   Objective:   BP 138/62 (BP Location: Left Arm, Patient Position: Sitting, Cuff Size: Large)   Pulse 83   Temp 97.6 F (36.4 C) (Oral)   Resp 18   Wt 216 lb 9.6 oz (98.2 kg)   SpO2 94%   BMI 30.64 kg/m  Vitals:   03/28/18 1528  BP: 138/62  Pulse: 83  Resp: 18  Temp: 97.6 F (36.4 C)  TempSrc: Oral  SpO2: 94%  Weight: 216 lb 9.6 oz (98.2 kg)     Physical Exam  Constitutional: She appears well-developed and well-nourished. No distress.  HENT:  Head: Normocephalic and atraumatic.  Right Ear: Hearing, tympanic membrane, external ear and ear canal normal.  Left Ear: Hearing, tympanic membrane, external ear and ear canal normal.  Nose: Right sinus exhibits maxillary sinus tenderness. Left sinus exhibits maxillary sinus tenderness.  Mouth/Throat: Uvula is  midline, oropharynx is clear and moist and mucous membranes are normal. No oropharyngeal exudate.  Eyes: Pupils are equal, round, and reactive to light. Conjunctivae are normal. Right eye exhibits no discharge. Left eye exhibits no discharge. No scleral icterus.  Neck: Normal range of motion. Neck supple. No tracheal deviation present. No thyromegaly present.  Cardiovascular: Normal rate and regular rhythm. Exam reveals no gallop and no friction rub.  Murmur heard. Pulmonary/Chest: Effort normal. No stridor. No respiratory distress. She has wheezes. She has rhonchi. She has rales.  Lymphadenopathy:    She has no cervical adenopathy.  Skin: Skin is warm and dry. She is not diaphoretic.  Vitals reviewed.      Assessment & Plan:     1.  Restless leg syndrome Increased dose as below since having more pain in the morning now as well.  - gabapentin (NEURONTIN) 300 MG capsule; Take 1200mg  (4 capsules) at bedtime, take 300mg  (1 capsule) in the morning  Dispense: 450 capsule; Refill: 1  2. Acute bronchitis with COPD ([place]) Worsening symptoms that have not responded to OTC medications. Will give augmentin as below. Also given prednisone and hycodan for cough. Continue allergy medications. Stay well hydrated and get plenty of rest. Call if no symptom improvement or if symptoms worsen. - amoxicillin-clavulanate (AUGMENTIN) 875-125 MG tablet; Take 1 tablet by mouth 2 (two) times daily.  Dispense: 20 tablet; Refill: 0 - predniSONE (DELTASONE) 10 MG tablet; Take 6 tabs PO on day 1&2, 5 tabs PO on day 3&4, 4 tabs PO on day 5&6, 3 tabs PO on day 7&8, 2 tabs PO on day 9&10, 1 tab PO on day 11&12.  Dispense: 42 tablet; Refill: 0 - HYDROcodone-homatropine (HYCODAN) 5-1.5 MG/5ML syrup; Take 5 mLs by mouth every 8 (eight) hours as needed for cough.  Dispense: 180 mL; Refill: 0  3. Antibiotic-induced yeast infection - fluconazole (DIFLUCAN) 150 MG tablet; Take 1 tablet (150 mg total) by mouth once for 1 dose.   Dispense: 2 tablet; Refill: 0       Mar Daring, PA-C  [facility]

## 2018-04-13 ENCOUNTER — Telehealth: Payer: Self-pay | Admitting: *Deleted

## 2018-04-13 DIAGNOSIS — Z87891 Personal history of nicotine dependence: Secondary | ICD-10-CM

## 2018-04-13 DIAGNOSIS — Z122 Encounter for screening for malignant neoplasm of respiratory organs: Secondary | ICD-10-CM

## 2018-04-13 NOTE — Telephone Encounter (Signed)
Patient has been notified that annual lung cancer screening low dose CT scan is due currently or will be in near future. Confirmed that patient is within the age range of 55-77, and asymptomatic, (no signs or symptoms of lung cancer). Patient denies illness that would prevent curative treatment for lung cancer if found. Verified smoking history, (current, 47 pack year). The shared decision making visit was done 05/03/16. Patient is agreeable for CT scan being scheduled.

## 2018-04-17 ENCOUNTER — Telehealth: Payer: Self-pay | Admitting: Physician Assistant

## 2018-04-17 DIAGNOSIS — J209 Acute bronchitis, unspecified: Secondary | ICD-10-CM

## 2018-04-17 DIAGNOSIS — B379 Candidiasis, unspecified: Secondary | ICD-10-CM

## 2018-04-17 DIAGNOSIS — T3695XA Adverse effect of unspecified systemic antibiotic, initial encounter: Principal | ICD-10-CM

## 2018-04-17 DIAGNOSIS — R05 Cough: Secondary | ICD-10-CM

## 2018-04-17 DIAGNOSIS — R059 Cough, unspecified: Secondary | ICD-10-CM

## 2018-04-17 DIAGNOSIS — J440 Chronic obstructive pulmonary disease with acute lower respiratory infection: Secondary | ICD-10-CM

## 2018-04-17 MED ORDER — FLUCONAZOLE 150 MG PO TABS: 150.0000 mg | ORAL_TABLET | Freq: Once | ORAL | 0 refills | Status: AC

## 2018-04-17 MED ORDER — AMOXICILLIN-POT CLAVULANATE 875-125 MG PO TABS: 1.0000 | ORAL_TABLET | Freq: Two times a day (BID) | ORAL | 0 refills | Status: DC

## 2018-04-17 MED ORDER — BENZONATATE 200 MG PO CAPS: 200.0000 mg | ORAL_CAPSULE | Freq: Three times a day (TID) | ORAL | 3 refills | Status: DC | PRN

## 2018-04-17 NOTE — Telephone Encounter (Signed)
Attempted to contact patient to advise her that RX has been sent to pharmacy. No answer or voicemail.

## 2018-04-17 NOTE — Telephone Encounter (Signed)
Was in on the 28th of August.  She was given antibiotic and got better but now having all the same symptoms again.  Please advise  CB#  [phone]  [facility]  Thanks Con Memos

## 2018-04-17 NOTE — Telephone Encounter (Signed)
Sent in [facility]

## 2018-05-10 ENCOUNTER — Other Ambulatory Visit: Payer: Self-pay | Admitting: Physician Assistant

## 2018-05-10 DIAGNOSIS — R110 Nausea: Secondary | ICD-10-CM

## 2018-05-15 ENCOUNTER — Encounter: Payer: Self-pay | Admitting: Physician Assistant

## 2018-05-15 ENCOUNTER — Ambulatory Visit (INDEPENDENT_AMBULATORY_CARE_PROVIDER_SITE_OTHER): Payer: BLUE CROSS/BLUE SHIELD | Admitting: Physician Assistant

## 2018-05-15 VITALS — BP 110/68 | HR 96 | Temp 98.1°F | Resp 16 | Wt 218.6 lb

## 2018-05-15 DIAGNOSIS — H66002 Acute suppurative otitis media without spontaneous rupture of ear drum, left ear: Secondary | ICD-10-CM | POA: Diagnosis not present

## 2018-05-15 DIAGNOSIS — H8112 Benign paroxysmal vertigo, left ear: Secondary | ICD-10-CM | POA: Diagnosis not present

## 2018-05-15 DIAGNOSIS — J440 Chronic obstructive pulmonary disease with acute lower respiratory infection: Secondary | ICD-10-CM | POA: Diagnosis not present

## 2018-05-15 DIAGNOSIS — B379 Candidiasis, unspecified: Secondary | ICD-10-CM | POA: Diagnosis not present

## 2018-05-15 DIAGNOSIS — J209 Acute bronchitis, unspecified: Secondary | ICD-10-CM

## 2018-05-15 DIAGNOSIS — T3695XA Adverse effect of unspecified systemic antibiotic, initial encounter: Secondary | ICD-10-CM

## 2018-05-15 MED ORDER — PREDNISONE 10 MG PO TABS: ORAL_TABLET | ORAL | 0 refills | Status: DC

## 2018-05-15 MED ORDER — FLUCONAZOLE 150 MG PO TABS: 150.0000 mg | ORAL_TABLET | Freq: Once | ORAL | 0 refills | Status: AC

## 2018-05-15 MED ORDER — MECLIZINE HCL 25 MG PO TABS: 25.0000 mg | ORAL_TABLET | Freq: Three times a day (TID) | ORAL | 0 refills | Status: DC | PRN

## 2018-05-15 MED ORDER — AMOXICILLIN-POT CLAVULANATE 875-125 MG PO TABS: 1.0000 | ORAL_TABLET | Freq: Two times a day (BID) | ORAL | 0 refills | Status: DC

## 2018-05-15 NOTE — Patient Instructions (Signed)

## 2018-05-15 NOTE — Progress Notes (Signed)
Patient: Linda Bradford Female    DOB: September 01, 1955   62 y.o.   MRN: [id number] Visit Date: 05/15/2018  Today's Provider: Mar Daring, PA-C   Chief Complaint  Patient presents with  . Dizziness   Subjective:    HPI  Patient comes in office today with complaints of dizziness that she describes as the room spinning upon standing, rolling over in bed or any change of head position. Patient states that she had one episode last week, one yesterday and this morning. Patient states that her blood pressure and sugar levels have been normal outside of the office. Patient reports associated symptoms of nausea and pain in her left ear with vertigo episodes. Denies tinnitus or hearing loss.   Allergies  Allergen Reactions  . Dexilant [Dexlansoprazole] Nausea Only and Other (See Comments)    Dizziness     Current Outpatient Medications:  .  acetaminophen (TYLENOL) 500 MG tablet, Take 1,000 mg by mouth every 6 (six) hours as needed., Disp: , Rfl:  .  albuterol (PROVENTIL HFA;VENTOLIN HFA) 108 (90 Base) MCG/ACT inhaler, Inhale 1 puff into the lungs every 4 (four) hours as needed., Disp: 1 Inhaler, Rfl: 11 .  benzonatate (TESSALON) 200 MG capsule, Take 1 capsule (200 mg total) by mouth 3 (three) times daily as needed for cough., Disp: 90 capsule, Rfl: 3 .  BREO ELLIPTA 200-25 MCG/INH AEPB, INHALE 1 PUFF BY MOUTH ONCE DAILY, Disp: 60 each, Rfl: 5 .  clonazePAM (KLONOPIN) 0.5 MG tablet, TAKE 1/2 TABLET BY MOUTH TWICE DAILY AS NEEDED, Disp: 180 tablet, Rfl: 1 .  Cyanocobalamin (RA VITAMIN B-12 TR) 1000 MCG TBCR, Take 1 tablet by mouth daily. , Disp: , Rfl:  .  diclofenac sodium (VOLTAREN) 1 % GEL, Apply 4 g topically 4 (four) times daily as needed., Disp: 100 g, Rfl: 1 .  DULoxetine (CYMBALTA) 60 MG capsule, TAKE 1 CAPSULE BY MOUTH TWICE DAILY, Disp: 180 capsule, Rfl: 1 .  ezetimibe (ZETIA) 10 MG tablet, TAKE 1 TABLET BY MOUTH ONCE DAILY, Disp: 90 tablet, Rfl: 1 .  gabapentin (NEURONTIN) 300  MG capsule, Take 1200mg  (4 capsules) at bedtime, take 300mg  (1 capsule) in the morning, Disp: 450 capsule, Rfl: 1 .  glucose blood (CONTOUR NEXT TEST) test strip, To check blood sugar once daily, Disp: 100 each, Rfl: 12 .  ipratropium-albuterol (DUONEB) 0.5-2.5 (3) MG/3ML SOLN, Take 3 mLs by nebulization every 6 (six) hours as needed., Disp: 360 mL, Rfl: 11 .  levothyroxine (SYNTHROID, LEVOTHROID) 75 MCG tablet, TAKE 1 TABLET BY MOUTH ONCE DAILY ON AN EMPTY STOMACH. WAIT 30 MINUTES BEFORE TAKING OTHER MEDS., Disp: 90 tablet, Rfl: 1 .  losartan (COZAAR) 50 MG tablet, TAKE 1 TABLET BY MOUTH ONCE DAILY, Disp: 90 tablet, Rfl: 2 .  meloxicam (MOBIC) 15 MG tablet, TAKE 1 TABLET BY MOUTH ONCE DAILY., Disp: 90 tablet, Rfl: 1 .  metFORMIN (GLUCOPHAGE-XR) 500 MG 24 hr tablet, TAKE 2 TABLETS BY MOUTH ONCE DAILY WITH SUPPER, Disp: 180 tablet, Rfl: 1 .  THEO-24 300 MG 24 hr capsule, TAKE 1 CAPSULE BY MOUTH ONCE DAILY, Disp: 90 capsule, Rfl: 1 .  tiZANidine (ZANAFLEX) 4 MG capsule, TAKE 1 CAPSULE BY MOUTH 3 TIMES DAILY, Disp: 90 capsule, Rfl: 6 .  Vitamin D, Ergocalciferol, (DRISDOL) 50000 units CAPS capsule, TAKE 1 CAPSULE BY MOUTH EVERY 7 DAYS, Disp: 12 capsule, Rfl: 3 .  Dextromethorphan-guaiFENesin ([place] FAST-MAX DM MAX) 5-100 MG/5ML LIQD, Take 5 mLs by mouth 2 (two) times  daily. (Patient not taking: Reported on 03/28/2018), Disp: 180 mL, Rfl: 3 .  fexofenadine (ALLEGRA) 180 MG tablet, Take 1 tablet (180 mg total) by mouth daily. (Patient not taking: Reported on 05/15/2018), Disp: 90 tablet, Rfl: 1 .  fluticasone (FLONASE) 50 MCG/ACT nasal spray, Place 2 sprays daily into both nostrils. (Patient not taking: Reported on 05/15/2018), Disp: 48 g, Rfl: 1 .  ondansetron (ZOFRAN) 4 MG tablet, TAKE 1 TABLET BY MOUTH EVERY 8 HOURS AS NEEDED (Patient not taking: Reported on 05/15/2018), Disp: 20 tablet, Rfl: 0 .  varenicline (CHANTIX PAK) 0.5 MG X 11 & 1 MG X 42 tablet, Take one 0.5 mg tab PO once daily for 3 days,  then increase to one 0.5 mg tablet twice daily for 4 days, then increase to one 1 mg tab BID. (Patient not taking: Reported on 12/28/2017), Disp: 53 tablet, Rfl: 0  Review of Systems  Constitutional: Negative.   HENT: Positive for ear pain and sinus pain. Negative for dental problem, drooling, ear discharge, facial swelling, hearing loss, mouth sores, nosebleeds, postnasal drip, rhinorrhea, sinus pressure, sneezing, sore throat and tinnitus.   Eyes: Negative.   Respiratory: Positive for cough and wheezing. Negative for chest tightness and shortness of breath.   Cardiovascular: Negative.   Gastrointestinal: Positive for nausea. Negative for abdominal pain and vomiting.  Musculoskeletal: Negative.   Neurological: Positive for dizziness. Negative for headaches.    Social History   Tobacco Use  . Smoking status: Current Every Day Smoker    Packs/day: 1.00    Years: 45.00    Pack years: 45.00    Types: Cigarettes    Start date: 03/02/1980  . Smokeless tobacco: Never Used  . Tobacco comment: every other day  Substance Use Topics  . Alcohol use: No    Alcohol/week: 0.0 standard drinks   Objective:   BP 110/68   Pulse 96   Temp 98.1 F (36.7 C) (Oral)   Resp 16   Wt 218 lb 9.6 oz (99.2 kg)   SpO2 93%   BMI 30.92 kg/m  Vitals:   05/15/18 1454  BP: 110/68  Pulse: 96  Resp: 16  Temp: 98.1 F (36.7 C)  TempSrc: Oral  SpO2: 93%  Weight: 218 lb 9.6 oz (99.2 kg)     Physical Exam  Constitutional: She is oriented to person, place, and time. She appears well-developed and well-nourished. No distress.  HENT:  Head: Normocephalic and atraumatic.  Right Ear: Hearing, tympanic membrane, external ear and ear canal normal.  Left Ear: Hearing, external ear and ear canal normal. There is tenderness. Tympanic membrane is erythematous and bulging. A middle ear effusion (ppurulent) is present.  Nose: Mucosal edema and rhinorrhea present. Right sinus exhibits frontal sinus tenderness. Right  sinus exhibits no maxillary sinus tenderness. Left sinus exhibits frontal sinus tenderness. Left sinus exhibits no maxillary sinus tenderness.  Mouth/Throat: Uvula is midline and mucous membranes are normal. Posterior oropharyngeal erythema present. No oropharyngeal exudate or posterior oropharyngeal edema.  Eyes: Pupils are equal, round, and reactive to light. Conjunctivae are normal. Right eye exhibits no discharge. Left eye exhibits no discharge. No scleral icterus. Right eye exhibits normal extraocular motion and no nystagmus. Left eye exhibits nystagmus. Left eye exhibits normal extraocular motion.  Neck: Normal range of motion. Neck supple. No tracheal deviation present. No thyromegaly present.  Cardiovascular: Normal rate, regular rhythm and normal heart sounds. Exam reveals no gallop and no friction rub.  No murmur heard. Pulmonary/Chest: Effort normal. No stridor. No  respiratory distress. She has no wheezes. She has rhonchi (throughout). She has no rales.  Lymphadenopathy:    She has no cervical adenopathy.  Neurological: She is alert and oriented to person, place, and time. She has normal strength. No cranial nerve deficit or sensory deficit. She displays a negative Romberg sign. Coordination and gait abnormal.  Skin: Skin is warm and dry. She is not diaphoretic.  Vitals reviewed.      Assessment & Plan:     1. Non-recurrent acute suppurative otitis media of left ear without spontaneous rupture of tympanic membrane Worsening symptoms that have not responded to OTC medications. Will give augmentin as below. Continue allergy medications. Stay well hydrated and get plenty of rest. Call if no symptom improvement or if symptoms worsen. - amoxicillin-clavulanate (AUGMENTIN) 875-125 MG tablet; Take 1 tablet by mouth 2 (two) times daily.  Dispense: 20 tablet; Refill: 0  2. Benign paroxysmal positional vertigo of left ear Will give meclizine as below for vertigo. Patient instructed in office  on how to perform Brandt-Daroff exercises. She is to call if symptoms worsen or fail to improve and will refer to ENT.  - meclizine (ANTIVERT) 25 MG tablet; Take 1 tablet (25 mg total) by mouth 3 (three) times daily as needed for dizziness.  Dispense: 30 tablet; Refill: 0  3. Acute bronchitis with COPD ([place]) Worsening despite inhalers and nebulizer. Will give prednisone taper as below. Advised to monitor sugars closely and call if increasing.  - predniSONE (DELTASONE) 10 MG tablet; Take 6 tabs PO on day 1&2, 5 tabs PO on day 3&4, 4 tabs PO on day 5&6, 3 tabs PO on day 7&8, 2 tabs PO on day 9&10, 1 tab PO on day 11&12.  Dispense: 42 tablet; Refill: 0  4. Antibiotic-induced yeast infection Patient gets yeast infections with antibiotic use, specifically augmentin. Diflucan given in case needed.  - fluconazole (DIFLUCAN) 150 MG tablet; Take 1 tablet (150 mg total) by mouth once for 1 dose.  Dispense: 1 tablet; Refill: 0       Mar Daring, PA-C  [facility]

## 2018-05-17 ENCOUNTER — Encounter: Payer: Self-pay | Admitting: Physician Assistant

## 2018-05-17 ENCOUNTER — Ambulatory Visit
Admission: RE | Admit: 2018-05-17 | Discharge: 2018-05-17 | Disposition: A | Discharge status: Home / Self Care | Payer: BLUE CROSS/BLUE SHIELD | Source: Ambulatory Visit | Attending: Oncology | Admitting: Oncology

## 2018-05-17 DIAGNOSIS — I700 Atherosclerosis of aorta: Secondary | ICD-10-CM | POA: Diagnosis not present

## 2018-05-17 DIAGNOSIS — J432 Centrilobular emphysema: Secondary | ICD-10-CM | POA: Diagnosis not present

## 2018-05-17 DIAGNOSIS — F17210 Nicotine dependence, cigarettes, uncomplicated: Secondary | ICD-10-CM | POA: Diagnosis not present

## 2018-05-17 DIAGNOSIS — H8112 Benign paroxysmal vertigo, left ear: Secondary | ICD-10-CM

## 2018-05-17 DIAGNOSIS — I2510 Atherosclerotic heart disease of native coronary artery without angina pectoris: Secondary | ICD-10-CM | POA: Insufficient documentation

## 2018-05-17 DIAGNOSIS — Z87891 Personal history of nicotine dependence: Secondary | ICD-10-CM | POA: Diagnosis not present

## 2018-05-17 DIAGNOSIS — Z122 Encounter for screening for malignant neoplasm of respiratory organs: Secondary | ICD-10-CM | POA: Diagnosis not present

## 2018-05-18 ENCOUNTER — Telehealth: Payer: Self-pay | Admitting: *Deleted

## 2018-05-18 NOTE — Telephone Encounter (Signed)
Attempted to contact patient to discuss LDCT lung cancer screening results.  Unable to reach patient at this time.  Left Message for them to return call to [phone] to either myself or Shawn Perkins RN to discuss the results.    

## 2018-05-21 ENCOUNTER — Telehealth: Payer: Self-pay | Admitting: *Deleted

## 2018-05-21 DIAGNOSIS — J329 Chronic sinusitis, unspecified: Secondary | ICD-10-CM | POA: Diagnosis not present

## 2018-05-21 DIAGNOSIS — R42 Dizziness and giddiness: Secondary | ICD-10-CM | POA: Diagnosis not present

## 2018-05-21 DIAGNOSIS — H903 Sensorineural hearing loss, bilateral: Secondary | ICD-10-CM | POA: Diagnosis not present

## 2018-05-21 DIAGNOSIS — J309 Allergic rhinitis, unspecified: Secondary | ICD-10-CM | POA: Diagnosis not present

## 2018-05-21 NOTE — Telephone Encounter (Signed)
Notified patient of LDCT lung cancer screening program results with recommendation for 6 month follow up imaging.  Also notified of incidental findings noted below and is encouraged to discuss further questions with PCP who will receive a copy of this not and/or the CT reports.  Patient verbalized understanding.     IMPRESSION: 1. Lung-RADS 3S, probably benign findings. Short-term follow-up in 6 months is recommended with repeat low-dose chest CT without contrast (please use the following order, "CT CHEST LCS NODULE FOLLOW-UP W/O CM"). 2. The "S" modifier above refers to potentially clinically significant non lung cancer related findings. Specifically, there is aortic atherosclerosis, in addition to right coronary artery disease. Please note that although the presence of coronary artery calcium documents the presence of coronary artery disease, the severity of this disease and any potential stenosis cannot be assessed on this non-gated CT examination. Assessment for potential risk factor modification, dietary therapy or pharmacologic therapy may be warranted, if clinically indicated. 3. Mild diffuse bronchial wall thickening with mild centrilobular and paraseptal emphysema; imaging findings suggestive of underlying COPD.  Aortic Atherosclerosis (ICD10-I70.0) and Emphysema (ICD10-J43.9).

## 2018-05-26 ENCOUNTER — Encounter: Payer: Self-pay | Admitting: Emergency Medicine

## 2018-05-26 DIAGNOSIS — F17210 Nicotine dependence, cigarettes, uncomplicated: Secondary | ICD-10-CM | POA: Diagnosis present

## 2018-05-26 DIAGNOSIS — J449 Chronic obstructive pulmonary disease, unspecified: Secondary | ICD-10-CM | POA: Diagnosis not present

## 2018-05-26 DIAGNOSIS — E039 Hypothyroidism, unspecified: Secondary | ICD-10-CM | POA: Diagnosis present

## 2018-05-26 DIAGNOSIS — Z6833 Body mass index (BMI) 33.0-33.9, adult: Secondary | ICD-10-CM | POA: Diagnosis not present

## 2018-05-26 DIAGNOSIS — G2581 Restless legs syndrome: Secondary | ICD-10-CM | POA: Diagnosis present

## 2018-05-26 DIAGNOSIS — E119 Type 2 diabetes mellitus without complications: Secondary | ICD-10-CM | POA: Diagnosis not present

## 2018-05-26 DIAGNOSIS — Z7951 Long term (current) use of inhaled steroids: Secondary | ICD-10-CM

## 2018-05-26 DIAGNOSIS — K5900 Constipation, unspecified: Secondary | ICD-10-CM | POA: Diagnosis not present

## 2018-05-26 DIAGNOSIS — Z833 Family history of diabetes mellitus: Secondary | ICD-10-CM

## 2018-05-26 DIAGNOSIS — F411 Generalized anxiety disorder: Secondary | ICD-10-CM | POA: Diagnosis present

## 2018-05-26 DIAGNOSIS — K5720 Diverticulitis of large intestine with perforation and abscess without bleeding: Principal | ICD-10-CM | POA: Diagnosis present

## 2018-05-26 DIAGNOSIS — M797 Fibromyalgia: Secondary | ICD-10-CM | POA: Diagnosis not present

## 2018-05-26 DIAGNOSIS — I10 Essential (primary) hypertension: Secondary | ICD-10-CM | POA: Diagnosis not present

## 2018-05-26 DIAGNOSIS — R1032 Left lower quadrant pain: Secondary | ICD-10-CM | POA: Diagnosis not present

## 2018-05-26 DIAGNOSIS — Z85828 Personal history of other malignant neoplasm of skin: Secondary | ICD-10-CM | POA: Diagnosis not present

## 2018-05-26 DIAGNOSIS — Z79890 Hormone replacement therapy (postmenopausal): Secondary | ICD-10-CM | POA: Diagnosis not present

## 2018-05-26 DIAGNOSIS — E669 Obesity, unspecified: Secondary | ICD-10-CM | POA: Diagnosis not present

## 2018-05-26 DIAGNOSIS — K5792 Diverticulitis of intestine, part unspecified, without perforation or abscess without bleeding: Secondary | ICD-10-CM | POA: Diagnosis not present

## 2018-05-26 DIAGNOSIS — Z8249 Family history of ischemic heart disease and other diseases of the circulatory system: Secondary | ICD-10-CM | POA: Diagnosis not present

## 2018-05-26 DIAGNOSIS — Z811 Family history of alcohol abuse and dependence: Secondary | ICD-10-CM | POA: Diagnosis not present

## 2018-05-26 DIAGNOSIS — F339 Major depressive disorder, recurrent, unspecified: Secondary | ICD-10-CM | POA: Diagnosis not present

## 2018-05-26 DIAGNOSIS — Z79899 Other long term (current) drug therapy: Secondary | ICD-10-CM | POA: Diagnosis not present

## 2018-05-26 DIAGNOSIS — Z888 Allergy status to other drugs, medicaments and biological substances status: Secondary | ICD-10-CM | POA: Diagnosis not present

## 2018-05-26 DIAGNOSIS — Z7984 Long term (current) use of oral hypoglycemic drugs: Secondary | ICD-10-CM | POA: Diagnosis not present

## 2018-05-26 DIAGNOSIS — R1030 Lower abdominal pain, unspecified: Secondary | ICD-10-CM | POA: Diagnosis present

## 2018-05-26 LAB — URINALYSIS, COMPLETE (UACMP) WITH MICROSCOPIC
Bacteria, UA: NONE SEEN
Bilirubin Urine: NEGATIVE
Glucose, UA: NEGATIVE mg/dL
Hgb urine dipstick: NEGATIVE
Ketones, ur: NEGATIVE mg/dL
Leukocytes, UA: NEGATIVE
Nitrite: NEGATIVE
Protein, ur: NEGATIVE mg/dL
Specific Gravity, Urine: 1.012 (ref 1.005–1.030)
pH: 7 (ref 5.0–8.0)

## 2018-05-26 LAB — COMPREHENSIVE METABOLIC PANEL
ALT: 20 U/L (ref 0–44)
AST: 19 U/L (ref 15–41)
Albumin: 4.2 g/dL (ref 3.5–5.0)
Alkaline Phosphatase: 90 U/L (ref 38–126)
Anion gap: 8 (ref 5–15)
BUN: 18 mg/dL (ref 8–23)
CO2: 29 mmol/L (ref 22–32)
Calcium: 9.5 mg/dL (ref 8.9–10.3)
Chloride: 101 mmol/L (ref 98–111)
Creatinine, Ser: 0.7 mg/dL (ref 0.44–1.00)
GFR calc Af Amer: >60 mL/min (ref >60)
GFR calc non Af Amer: >60 mL/min (ref >60)
Glucose, Bld: 165 mg/dL — ABNORMAL HIGH (ref 70–99)
Potassium: 3.9 mmol/L (ref 3.5–5.1)
Sodium: 138 mmol/L (ref 135–145)
Total Bilirubin: 0.6 mg/dL (ref 0.3–1.2)
Total Protein: 7.5 g/dL (ref 6.5–8.1)

## 2018-05-26 LAB — CBC
HCT: 45.9 % (ref 36.0–46.0)
Hemoglobin: 15.1 g/dL — ABNORMAL HIGH (ref 12.0–15.0)
MCH: 31.1 pg (ref 26.0–34.0)
MCHC: 32.9 g/dL (ref 30.0–36.0)
MCV: 94.4 fL (ref 80.0–100.0)
Platelets: 286 10*3/uL (ref 150–400)
RBC: 4.86 MIL/uL (ref 3.87–5.11)
RDW: 12.9 % (ref 11.5–15.5)
WBC: 14.4 10*3/uL — ABNORMAL HIGH (ref 4.0–10.5)
nRBC: 0 % (ref 0.0–0.2)

## 2018-05-26 MED ORDER — ACETAMINOPHEN 325 MG PO TABS
650.0000 mg | ORAL_TABLET | Freq: Once | ORAL | Status: AC
  Administered 2018-05-26: 650 mg via ORAL
  Filled 2018-05-26: qty 2

## 2018-05-26 MED ORDER — ONDANSETRON 4 MG PO TBDP
ORAL_TABLET | ORAL | Status: AC
  Administered 2018-05-26: 4 mg via ORAL
  Filled 2018-05-26: qty 1

## 2018-05-26 MED ORDER — ONDANSETRON 4 MG PO TBDP
4.0000 mg | ORAL_TABLET | Freq: Once | ORAL | Status: AC
  Administered 2018-05-26: 4 mg via ORAL

## 2018-05-26 NOTE — ED Notes (Signed)
Patient moved to family room for comfort.

## 2018-05-26 NOTE — ED Notes (Signed)
Patient yelling and moaning loudly.  Rounded on patient and reassured patient that we were working very hard to to open up treatment rooms, but no were available at this time.  LouAnn with Patient Relations to speak with patient.

## 2018-05-26 NOTE — ED Triage Notes (Signed)
Patient with complaint of lower abdominal pain, nausea and pain with urination that started about 2 hours ago.

## 2018-05-27 ENCOUNTER — Other Ambulatory Visit: Payer: Self-pay

## 2018-05-27 ENCOUNTER — Inpatient Hospital Stay
Admission: EM | Admit: 2018-05-27 | Discharge: 2018-06-01 | DRG: 392 | Disposition: A | Discharge status: Home / Self Care | Payer: BLUE CROSS/BLUE SHIELD | Attending: Surgery | Admitting: Surgery

## 2018-05-27 ENCOUNTER — Emergency Department: Payer: BLUE CROSS/BLUE SHIELD

## 2018-05-27 ENCOUNTER — Encounter: Payer: Self-pay | Admitting: *Deleted

## 2018-05-27 DIAGNOSIS — Z8249 Family history of ischemic heart disease and other diseases of the circulatory system: Secondary | ICD-10-CM | POA: Diagnosis not present

## 2018-05-27 DIAGNOSIS — Z888 Allergy status to other drugs, medicaments and biological substances status: Secondary | ICD-10-CM | POA: Diagnosis not present

## 2018-05-27 DIAGNOSIS — Z7951 Long term (current) use of inhaled steroids: Secondary | ICD-10-CM | POA: Diagnosis not present

## 2018-05-27 DIAGNOSIS — F339 Major depressive disorder, recurrent, unspecified: Secondary | ICD-10-CM | POA: Diagnosis present

## 2018-05-27 DIAGNOSIS — I10 Essential (primary) hypertension: Secondary | ICD-10-CM | POA: Diagnosis present

## 2018-05-27 DIAGNOSIS — K5792 Diverticulitis of intestine, part unspecified, without perforation or abscess without bleeding: Secondary | ICD-10-CM | POA: Diagnosis not present

## 2018-05-27 DIAGNOSIS — Z79899 Other long term (current) drug therapy: Secondary | ICD-10-CM | POA: Diagnosis not present

## 2018-05-27 DIAGNOSIS — G2581 Restless legs syndrome: Secondary | ICD-10-CM | POA: Diagnosis present

## 2018-05-27 DIAGNOSIS — E119 Type 2 diabetes mellitus without complications: Secondary | ICD-10-CM | POA: Diagnosis present

## 2018-05-27 DIAGNOSIS — Z79890 Hormone replacement therapy (postmenopausal): Secondary | ICD-10-CM | POA: Diagnosis not present

## 2018-05-27 DIAGNOSIS — H66002 Acute suppurative otitis media without spontaneous rupture of ear drum, left ear: Secondary | ICD-10-CM

## 2018-05-27 DIAGNOSIS — Z85828 Personal history of other malignant neoplasm of skin: Secondary | ICD-10-CM | POA: Diagnosis not present

## 2018-05-27 DIAGNOSIS — R1030 Lower abdominal pain, unspecified: Secondary | ICD-10-CM | POA: Diagnosis present

## 2018-05-27 DIAGNOSIS — Z833 Family history of diabetes mellitus: Secondary | ICD-10-CM | POA: Diagnosis not present

## 2018-05-27 DIAGNOSIS — F17210 Nicotine dependence, cigarettes, uncomplicated: Secondary | ICD-10-CM | POA: Diagnosis present

## 2018-05-27 DIAGNOSIS — K5900 Constipation, unspecified: Secondary | ICD-10-CM | POA: Diagnosis present

## 2018-05-27 DIAGNOSIS — F411 Generalized anxiety disorder: Secondary | ICD-10-CM | POA: Diagnosis present

## 2018-05-27 DIAGNOSIS — E669 Obesity, unspecified: Secondary | ICD-10-CM | POA: Diagnosis present

## 2018-05-27 DIAGNOSIS — J449 Chronic obstructive pulmonary disease, unspecified: Secondary | ICD-10-CM | POA: Diagnosis present

## 2018-05-27 DIAGNOSIS — Z811 Family history of alcohol abuse and dependence: Secondary | ICD-10-CM | POA: Diagnosis not present

## 2018-05-27 DIAGNOSIS — K5720 Diverticulitis of large intestine with perforation and abscess without bleeding: Secondary | ICD-10-CM

## 2018-05-27 DIAGNOSIS — E039 Hypothyroidism, unspecified: Secondary | ICD-10-CM | POA: Diagnosis present

## 2018-05-27 DIAGNOSIS — Z7984 Long term (current) use of oral hypoglycemic drugs: Secondary | ICD-10-CM | POA: Diagnosis not present

## 2018-05-27 DIAGNOSIS — M797 Fibromyalgia: Secondary | ICD-10-CM | POA: Diagnosis present

## 2018-05-27 DIAGNOSIS — Z6833 Body mass index (BMI) 33.0-33.9, adult: Secondary | ICD-10-CM | POA: Diagnosis not present

## 2018-05-27 LAB — BASIC METABOLIC PANEL
Anion gap: 5 (ref 5–15)
BUN: 19 mg/dL (ref 8–23)
CO2: 27 mmol/L (ref 22–32)
Calcium: 8.7 mg/dL — ABNORMAL LOW (ref 8.9–10.3)
Chloride: 106 mmol/L (ref 98–111)
Creatinine, Ser: 0.65 mg/dL (ref 0.44–1.00)
GFR calc Af Amer: >60 mL/min (ref >60)
GFR calc non Af Amer: >60 mL/min (ref >60)
Glucose, Bld: 197 mg/dL — ABNORMAL HIGH (ref 70–99)
Potassium: 4.5 mmol/L (ref 3.5–5.1)
Sodium: 138 mmol/L (ref 135–145)

## 2018-05-27 LAB — CBC WITH DIFFERENTIAL/PLATELET
Abs Immature Granulocytes: 0.08 10*3/uL — ABNORMAL HIGH (ref 0.00–0.07)
Basophils Absolute: 0 10*3/uL (ref 0.0–0.1)
Basophils Relative: 0 %
Eosinophils Absolute: 0.1 10*3/uL (ref 0.0–0.5)
Eosinophils Relative: 1 %
HCT: 41.3 % (ref 36.0–46.0)
Hemoglobin: 13.2 g/dL (ref 12.0–15.0)
Immature Granulocytes: 1 %
Lymphocytes Relative: 15 %
Lymphs Abs: 2.1 10*3/uL (ref 0.7–4.0)
MCH: 30.9 pg (ref 26.0–34.0)
MCHC: 32 g/dL (ref 30.0–36.0)
MCV: 96.7 fL (ref 80.0–100.0)
Monocytes Absolute: 0.5 10*3/uL (ref 0.1–1.0)
Monocytes Relative: 4 %
Neutro Abs: 10.9 10*3/uL — ABNORMAL HIGH (ref 1.7–7.7)
Neutrophils Relative %: 79 %
Platelets: 231 10*3/uL (ref 150–400)
RBC: 4.27 MIL/uL (ref 3.87–5.11)
RDW: 13.2 % (ref 11.5–15.5)
WBC: 13.8 10*3/uL — ABNORMAL HIGH (ref 4.0–10.5)
nRBC: 0 % (ref 0.0–0.2)

## 2018-05-27 LAB — GLUCOSE, CAPILLARY
Glucose-Capillary: 161 mg/dL — ABNORMAL HIGH (ref 70–99)
Glucose-Capillary: 160 mg/dL — ABNORMAL HIGH (ref 70–99)
Glucose-Capillary: 118 mg/dL — ABNORMAL HIGH (ref 70–99)
Glucose-Capillary: 118 mg/dL — ABNORMAL HIGH (ref 70–99)

## 2018-05-27 LAB — MAGNESIUM: Magnesium: 2 mg/dL (ref 1.7–2.4)

## 2018-05-27 MED ORDER — LEVOTHYROXINE SODIUM 50 MCG PO TABS
75.0000 ug | ORAL_TABLET | Freq: Every day | ORAL | Status: DC
  Administered 2018-05-27 – 2018-06-01 (×6): 75 ug via ORAL
  Filled 2018-05-27 (×6): qty 2

## 2018-05-27 MED ORDER — ENOXAPARIN SODIUM 40 MG/0.4ML ~~LOC~~ SOLN
40.0000 mg | SUBCUTANEOUS | Status: DC
  Administered 2018-05-28 – 2018-05-30 (×3): 40 mg via SUBCUTANEOUS
  Filled 2018-05-27 (×3): qty 0.4

## 2018-05-27 MED ORDER — INSULIN ASPART 100 UNIT/ML ~~LOC~~ SOLN
0.0000 [IU] | SUBCUTANEOUS | Status: DC
  Administered 2018-05-27 (×2): 4 [IU] via SUBCUTANEOUS
  Administered 2018-05-28: 3 [IU] via SUBCUTANEOUS
  Administered 2018-05-28 (×2): 4 [IU] via SUBCUTANEOUS
  Administered 2018-05-29: 3 [IU] via SUBCUTANEOUS
  Administered 2018-05-29: 4 [IU] via SUBCUTANEOUS
  Filled 2018-05-27 (×7): qty 1

## 2018-05-27 MED ORDER — PIPERACILLIN-TAZOBACTAM 3.375 G IVPB
3.3750 g | Freq: Three times a day (TID) | INTRAVENOUS | Status: DC
  Administered 2018-05-27 – 2018-06-01 (×16): 3.375 g via INTRAVENOUS
  Filled 2018-05-27 (×16): qty 50

## 2018-05-27 MED ORDER — ONDANSETRON HCL 4 MG/2ML IJ SOLN
4.0000 mg | Freq: Four times a day (QID) | INTRAMUSCULAR | Status: DC | PRN
  Administered 2018-05-31: 4 mg via INTRAVENOUS
  Filled 2018-05-27: qty 2

## 2018-05-27 MED ORDER — LACTATED RINGERS IV SOLN
125.0000 mL/h | INTRAVENOUS | Status: DC
  Administered 2018-05-27 – 2018-05-28 (×3): 125 mL/h via INTRAVENOUS

## 2018-05-27 MED ORDER — ONDANSETRON HCL 4 MG/2ML IJ SOLN
4.0000 mg | Freq: Once | INTRAMUSCULAR | Status: AC
  Administered 2018-05-27: 4 mg via INTRAVENOUS
  Filled 2018-05-27: qty 2

## 2018-05-27 MED ORDER — KETOROLAC TROMETHAMINE 30 MG/ML IJ SOLN
30.0000 mg | Freq: Four times a day (QID) | INTRAMUSCULAR | Status: AC
  Administered 2018-05-27 – 2018-05-31 (×19): 30 mg via INTRAVENOUS
  Filled 2018-05-27 (×19): qty 1

## 2018-05-27 MED ORDER — ALBUTEROL SULFATE (2.5 MG/3ML) 0.083% IN NEBU: 2.5000 mg | INHALATION_SOLUTION | RESPIRATORY_TRACT | Status: DC | PRN

## 2018-05-27 MED ORDER — FLUTICASONE FUROATE-VILANTEROL 200-25 MCG/INH IN AEPB
1.0000 | INHALATION_SPRAY | Freq: Every day | RESPIRATORY_TRACT | Status: DC
  Administered 2018-05-27 – 2018-06-01 (×6): 1 via RESPIRATORY_TRACT
  Filled 2018-05-27: qty 28

## 2018-05-27 MED ORDER — GABAPENTIN 400 MG PO CAPS
1200.0000 mg | ORAL_CAPSULE | Freq: Every day | ORAL | Status: DC
  Administered 2018-05-27 – 2018-05-31 (×5): 1200 mg via ORAL
  Filled 2018-05-27 (×5): qty 3

## 2018-05-27 MED ORDER — GABAPENTIN 600 MG PO TABS
300.0000 mg | ORAL_TABLET | Freq: Every day | ORAL | Status: DC
  Administered 2018-05-27 – 2018-06-01 (×6): 300 mg via ORAL
  Filled 2018-05-27 (×6): qty 1

## 2018-05-27 MED ORDER — ONDANSETRON 4 MG PO TBDP: 4.0000 mg | ORAL_TABLET | Freq: Four times a day (QID) | ORAL | Status: DC | PRN

## 2018-05-27 MED ORDER — HYDROMORPHONE HCL 1 MG/ML IJ SOLN
0.5000 mg | INTRAMUSCULAR | Status: DC | PRN
  Administered 2018-05-27 – 2018-05-31 (×13): 0.5 mg via INTRAVENOUS
  Filled 2018-05-27 (×13): qty 0.5

## 2018-05-27 MED ORDER — LOSARTAN POTASSIUM 50 MG PO TABS
50.0000 mg | ORAL_TABLET | Freq: Every day | ORAL | Status: DC
  Administered 2018-05-27 – 2018-06-01 (×6): 50 mg via ORAL
  Filled 2018-05-27 (×6): qty 1

## 2018-05-27 MED ORDER — SODIUM CHLORIDE 0.9 % IV BOLUS
1000.0000 mL | Freq: Once | INTRAVENOUS | Status: AC
  Administered 2018-05-27: 1000 mL via INTRAVENOUS

## 2018-05-27 MED ORDER — PIPERACILLIN-TAZOBACTAM 3.375 G IVPB 30 MIN
3.3750 g | Freq: Once | INTRAVENOUS | Status: AC
  Administered 2018-05-27: 3.375 g via INTRAVENOUS

## 2018-05-27 MED ORDER — HYDRALAZINE HCL 20 MG/ML IJ SOLN: 10.0000 mg | Freq: Four times a day (QID) | INTRAMUSCULAR | Status: DC | PRN

## 2018-05-27 MED ORDER — HYDROMORPHONE HCL 1 MG/ML IJ SOLN
1.0000 mg | Freq: Once | INTRAMUSCULAR | Status: AC
  Administered 2018-05-27: 1 mg via INTRAVENOUS
  Filled 2018-05-27: qty 1

## 2018-05-27 MED ORDER — DULOXETINE HCL 30 MG PO CPEP
60.0000 mg | ORAL_CAPSULE | Freq: Two times a day (BID) | ORAL | Status: DC
  Administered 2018-05-27 – 2018-06-01 (×11): 60 mg via ORAL
  Filled 2018-05-27 (×11): qty 2

## 2018-05-27 MED ORDER — FAMOTIDINE IN NACL 20-0.9 MG/50ML-% IV SOLN
20.0000 mg | Freq: Two times a day (BID) | INTRAVENOUS | Status: DC
  Administered 2018-05-27 – 2018-05-28 (×3): 20 mg via INTRAVENOUS
  Filled 2018-05-27 (×4): qty 50

## 2018-05-27 NOTE — ED Notes (Signed)
Family to stat desk asking about wait time. Family given update on wait time. Family verbalizes understanding.  

## 2018-05-27 NOTE — ED Notes (Signed)
Family to stat desk asking about wait time. Family given update. Family verbalizes understanding.  

## 2018-05-27 NOTE — Plan of Care (Signed)
  Problem: Education: Goal: Knowledge of General Education information will improve Description: Including pain rating scale, medication(s)/side effects and non-pharmacologic comfort measures Outcome: Progressing   Problem: Pain Managment: Goal: General experience of comfort will improve Outcome: Progressing   

## 2018-05-27 NOTE — H&P (Signed)
Date of Admission:  05/27/2018  Reason for Admission:  Acute diverticulitis with abscess  History of Present Illness: Iver Fehrenbach is a 62 y.o. female who presents with a one day history of left lower quadrant abdominal pain, associated with nausea but no emesis. She has not has this type of pain before.  She describes the pain as being severe and sharp, and it was particularly worse while waiting in the ED to be seen.  She denies any fevers, chills, chest pain, shortness of breath, or diarrhea.  She reports that she does have issues with constipation and sometimes that will lead to low pelvic pain, but it resolves after a bowel movement.  She did try an enema yesterday but it did not help with the pain.  Of note, she has been recently on Prednisone taper for COPD exacerbation, and Augmentin for an ear infection.  Past Medical History: Past Medical History:  Diagnosis Date  . Anxiety   . COPD (chronic obstructive pulmonary disease) ([place])   . Depression   . Diabetes mellitus without complication ([place])   . Fatigue   . Headache   . Hypertension   . Thyroid disease      Past Surgical History: Past Surgical History:  Procedure Laterality Date  . APPENDECTOMY    . TUBAL LIGATION      Home Medications: Prior to Admission medications   Medication Sig Start Date End Date Taking? Authorizing Provider  acetaminophen (TYLENOL) 500 MG tablet Take 1,000 mg by mouth every 6 (six) hours as needed.    [provider]  albuterol (PROVENTIL HFA;VENTOLIN HFA) 108 (90 Base) MCG/ACT inhaler Inhale 1 puff into the lungs every 4 (four) hours as needed. 08/04/17   Mar Daring, PA-C  amoxicillin-clavulanate (AUGMENTIN) 875-125 MG tablet Take 1 tablet by mouth 2 (two) times daily. 05/15/18   Mar Daring, PA-C  benzonatate (TESSALON) 200 MG capsule Take 1 capsule (200 mg total) by mouth 3 (three) times daily as needed for cough. 04/17/18   Mar Daring, PA-C  BREO ELLIPTA  200-25 MCG/INH AEPB INHALE 1 PUFF BY MOUTH ONCE DAILY 01/25/18   Mar Daring, PA-C  clonazePAM (KLONOPIN) 0.5 MG tablet TAKE 1/2 TABLET BY MOUTH TWICE DAILY AS NEEDED 02/23/18   Mar Daring, PA-C  Cyanocobalamin (RA VITAMIN B-12 TR) 1000 MCG TBCR Take 1 tablet by mouth daily.     [provider]  diclofenac sodium (VOLTAREN) 1 % GEL Apply 4 g topically 4 (four) times daily as needed. 03/01/18   Mar Daring, PA-C  DULoxetine (CYMBALTA) 60 MG capsule TAKE 1 CAPSULE BY MOUTH TWICE DAILY 02/23/18   Mar Daring, PA-C  ezetimibe (ZETIA) 10 MG tablet TAKE 1 TABLET BY MOUTH ONCE DAILY 12/01/17   Mar Daring, PA-C  gabapentin (NEURONTIN) 300 MG capsule Take 1200mg  (4 capsules) at bedtime, take 300mg  (1 capsule) in the morning 03/28/18   Mar Daring, PA-C  glucose blood (CONTOUR NEXT TEST) test strip To check blood sugar once daily 06/09/17   Mar Daring, PA-C  ipratropium-albuterol (DUONEB) 0.5-2.5 (3) MG/3ML SOLN Take 3 mLs by nebulization every 6 (six) hours as needed. 11/04/15   Mar Daring, PA-C  levothyroxine (SYNTHROID, LEVOTHROID) 75 MCG tablet TAKE 1 TABLET BY MOUTH ONCE DAILY ON AN EMPTY STOMACH. WAIT 30 MINUTES BEFORE TAKING OTHER MEDS. 02/23/18   Mar Daring, PA-C  losartan (COZAAR) 50 MG tablet TAKE 1 TABLET BY MOUTH ONCE DAILY 10/26/17   Burnette,  Clearnce Sorrel, PA-C  meclizine (ANTIVERT) 25 MG tablet Take 1 tablet (25 mg total) by mouth 3 (three) times daily as needed for dizziness. 05/15/18   Mar Daring, PA-C  meloxicam (MOBIC) 15 MG tablet TAKE 1 TABLET BY MOUTH ONCE DAILY. 03/28/18   Mar Daring, PA-C  metFORMIN (GLUCOPHAGE-XR) 500 MG 24 hr tablet TAKE 2 TABLETS BY MOUTH ONCE DAILY WITH SUPPER 03/01/18   Burnette, Clearnce Sorrel, PA-C  predniSONE (DELTASONE) 10 MG tablet Take 6 tabs PO on day 1&2, 5 tabs PO on day 3&4, 4 tabs PO on day 5&6, 3 tabs PO on day 7&8, 2 tabs PO on day 9&10, 1 tab PO on day 11&12.  05/15/18   Mar Daring, PA-C  THEO-24 300 MG 24 hr capsule TAKE 1 CAPSULE BY MOUTH ONCE DAILY 12/01/17   Mar Daring, PA-C  tiZANidine (ZANAFLEX) 4 MG capsule TAKE 1 CAPSULE BY MOUTH 3 TIMES DAILY 02/19/18   Mar Daring, PA-C  Vitamin D, Ergocalciferol, (DRISDOL) 50000 units CAPS capsule TAKE 1 CAPSULE BY MOUTH EVERY 7 DAYS 12/01/17   Mar Daring, PA-C    Allergies: Allergies  Allergen Reactions  . Dexilant [Dexlansoprazole] Nausea Only and Other (See Comments)    Dizziness    Social History:  reports that she has been smoking cigarettes. She started smoking about 38 years ago. She has a 45.00 pack-year smoking history. She has never used smokeless tobacco. She reports that she does not drink alcohol or use drugs.   Family History: Family History  Problem Relation Age of Onset  . Lung cancer Mother   . Alcohol abuse Sister   . Arthritis Sister   . Alcohol abuse Brother   . Heart disease Brother 47  . Heart attack Brother   . Pancreatic cancer Maternal Aunt   . Diabetes Maternal Aunt   . Alcohol abuse Son   . Drug abuse Son   . Drug abuse Son   . Heart disease Father 73  . Diabetes Sister   . Breast cancer Brother   . Alcohol abuse Maternal Uncle   . Alcohol abuse Maternal Grandmother   . Diabetes Maternal Grandmother     Review of Systems: Review of Systems  Constitutional: Negative for chills and fever.  HENT: Negative for hearing loss.   Eyes: Negative for blurred vision.  Respiratory: Negative for shortness of breath.   Cardiovascular: Negative for chest pain.  Gastrointestinal: Positive for abdominal pain, constipation and nausea. Negative for blood in stool, diarrhea and vomiting.  Genitourinary: Negative for dysuria.  Musculoskeletal: Negative for myalgias.  Skin: Negative for rash.  Neurological: Negative for dizziness.  Psychiatric/Behavioral: Negative for depression.    Physical Exam BP (!) 137/56 (BP Location: Right Arm)    Pulse 80   Temp 98.2 F (36.8 C)   Resp 20   Ht 5\' 9"  (1.753 m)   Wt 102.4 kg   SpO2 98%   BMI 33.34 kg/m  CONSTITUTIONAL: No acute distress HEENT:  Normocephalic, atraumatic, extraocular motion intact. NECK: Trachea is midline, and there is no jugular venous distension.  RESPIRATORY:  Lungs are clear, and breath sounds are equal bilaterally. Normal respiratory effort without pathologic use of accessory muscles. CARDIOVASCULAR: Heart is regular without murmurs, gallops, or rubs. GI: The abdomen is soft, non-distended, with tenderness to palpation in the left lower quadrant and low pelvis.  No peritonitis.  MUSCULOSKELETAL:  Normal muscle strength and tone in all four extremities.  No peripheral edema or cyanosis.  SKIN: Skin turgor is normal. There are no pathologic skin lesions.  NEUROLOGIC:  Motor and sensation is grossly normal.  Cranial nerves are grossly intact. PSYCH:  Alert and oriented to person, place and time. Affect is normal.  Laboratory Analysis: Results for orders placed or performed during the hospital encounter of 05/27/18 (from the past 24 hour(s))  Comprehensive metabolic panel     Status: Abnormal   Collection Time: 05/26/18  9:43 PM  Result Value Ref Range   Sodium 138 135 - 145 mmol/L   Potassium 3.9 3.5 - 5.1 mmol/L   Chloride 101 98 - 111 mmol/L   CO2 29 22 - 32 mmol/L   Glucose, Bld 165 (H) 70 - 99 mg/dL   BUN 18 8 - 23 mg/dL   Creatinine, Ser 0.70 0.44 - 1.00 mg/dL   Calcium 9.5 8.9 - 10.3 mg/dL   Total Protein 7.5 6.5 - 8.1 g/dL   Albumin 4.2 3.5 - 5.0 g/dL   AST 19 15 - 41 U/L   ALT 20 0 - 44 U/L   Alkaline Phosphatase 90 38 - 126 U/L   Total Bilirubin 0.6 0.3 - 1.2 mg/dL   GFR calc non Af Amer >60 >60 mL/min   GFR calc Af Amer >60 >60 mL/min   Anion gap 8 5 - 15  CBC     Status: Abnormal   Collection Time: 05/26/18  9:43 PM  Result Value Ref Range   WBC 14.4 (H) 4.0 - 10.5 K/uL   RBC 4.86 3.87 - 5.11 MIL/uL   Hemoglobin 15.1 (H) 12.0 - 15.0  g/dL   HCT 45.9 36.0 - 46.0 %   MCV 94.4 80.0 - 100.0 fL   MCH 31.1 26.0 - 34.0 pg   MCHC 32.9 30.0 - 36.0 g/dL   RDW 12.9 11.5 - 15.5 %   Platelets 286 150 - 400 K/uL   nRBC 0.0 0.0 - 0.2 %  Urinalysis, Complete w Microscopic     Status: Abnormal   Collection Time: 05/26/18  9:43 PM  Result Value Ref Range   Color, Urine YELLOW (A) YELLOW   APPearance CLOUDY (A) CLEAR   Specific Gravity, Urine 1.012 1.005 - 1.030   pH 7.0 5.0 - 8.0   Glucose, UA NEGATIVE NEGATIVE mg/dL   Hgb urine dipstick NEGATIVE NEGATIVE   Bilirubin Urine NEGATIVE NEGATIVE   Ketones, ur NEGATIVE NEGATIVE mg/dL   Protein, ur NEGATIVE NEGATIVE mg/dL   Nitrite NEGATIVE NEGATIVE   Leukocytes, UA NEGATIVE NEGATIVE   RBC / HPF 0-5 0 - 5 RBC/hpf   WBC, UA 0-5 0 - 5 WBC/hpf   Bacteria, UA NONE SEEN NONE SEEN   Squamous Epithelial / LPF 0-5 0 - 5   Amorphous Crystal PRESENT   Basic metabolic panel     Status: Abnormal   Collection Time: 05/27/18  5:31 AM  Result Value Ref Range   Sodium 138 135 - 145 mmol/L   Potassium 4.5 3.5 - 5.1 mmol/L   Chloride 106 98 - 111 mmol/L   CO2 27 22 - 32 mmol/L   Glucose, Bld 197 (H) 70 - 99 mg/dL   BUN 19 8 - 23 mg/dL   Creatinine, Ser 0.65 0.44 - 1.00 mg/dL   Calcium 8.7 (L) 8.9 - 10.3 mg/dL   GFR calc non Af Amer >60 >60 mL/min   GFR calc Af Amer >60 >60 mL/min   Anion gap 5 5 - 15  Magnesium     Status: None  Collection Time: 05/27/18  5:31 AM  Result Value Ref Range   Magnesium 2.0 1.7 - 2.4 mg/dL  CBC WITH DIFFERENTIAL     Status: Abnormal   Collection Time: 05/27/18  5:31 AM  Result Value Ref Range   WBC 13.8 (H) 4.0 - 10.5 K/uL   RBC 4.27 3.87 - 5.11 MIL/uL   Hemoglobin 13.2 12.0 - 15.0 g/dL   HCT 41.3 36.0 - 46.0 %   MCV 96.7 80.0 - 100.0 fL   MCH 30.9 26.0 - 34.0 pg   MCHC 32.0 30.0 - 36.0 g/dL   RDW 13.2 11.5 - 15.5 %   Platelets 231 150 - 400 K/uL   nRBC 0.0 0.0 - 0.2 %   Neutrophils Relative % 79 %   Neutro Abs 10.9 (H) 1.7 - 7.7 K/uL   Lymphocytes  Relative 15 %   Lymphs Abs 2.1 0.7 - 4.0 K/uL   Monocytes Relative 4 %   Monocytes Absolute 0.5 0.1 - 1.0 K/uL   Eosinophils Relative 1 %   Eosinophils Absolute 0.1 0.0 - 0.5 K/uL   Basophils Relative 0 %   Basophils Absolute 0.0 0.0 - 0.1 K/uL   Immature Granulocytes 1 %   Abs Immature Granulocytes 0.08 (H) 0.00 - 0.07 K/uL  Glucose, capillary     Status: Abnormal   Collection Time: 05/27/18  7:55 AM  Result Value Ref Range   Glucose-Capillary 161 (H) 70 - 99 mg/dL  Glucose, capillary     Status: Abnormal   Collection Time: 05/27/18 12:08 PM  Result Value Ref Range   Glucose-Capillary 160 (H) 70 - 99 mg/dL    Imaging: Ct Renal Stone Study  Result Date: 05/27/2018 CLINICAL DATA:  62 y/o F; lower abdominal pain, nausea, and dysuria. EXAM: CT ABDOMEN AND PELVIS WITHOUT CONTRAST TECHNIQUE: Multidetector CT imaging of the abdomen and pelvis was performed following the standard protocol without IV contrast. COMPARISON:  05/03/2016 CT chest FINDINGS: Lower chest: Stable 5 mm perifissural nodule in the right middle lobe compatible benign etiology. Hepatobiliary: No focal liver abnormality is seen. No gallstones, gallbladder wall thickening, or biliary dilatation. Pancreas: Unremarkable. No pancreatic ductal dilatation or surrounding inflammatory changes. Spleen: Splenic calcification compatible with prior granulomatous disease. No additional abnormality. Normal size. Adrenals/Urinary Tract: Adrenal glands are unremarkable. Kidneys are normal, without renal calculi, focal lesion, or hydronephrosis. Bladder is unremarkable. Stomach/Bowel: Acute perforated sigmoid diverticulitis with adjacent 2.5 x 2.4 cm air and fluid-filled collection within the left pelvis (AP by ML series 2, image 70). No obstructive or inflammatory changes of the small bowel. Normal stomach. 2.2 cm periampullary duodenum diverticulum without inflammatory change. Appendectomy. Vascular/Lymphatic: Aortic atherosclerosis. No enlarged  abdominal or pelvic lymph nodes. Reproductive: Uterus and bilateral adnexa are unremarkable. Other: No abdominal wall hernia or abnormality. No abdominopelvic ascites. Musculoskeletal: No fracture is seen. Mild osteoarthrosis of the hip joints with acetabular osteophytosis and fibrocystic degeneration. Lumbar spondylosis greatest at L5-S1 with there is moderate loss of intervertebral disc space height. IMPRESSION: Acute perforated sigmoid diverticulitis with adjacent 2.5 cm air and fluid-filled collection within the left pelvis. These results were called by telephone at the time of interpretation on 05/27/2018 at 3:29 am to Dr. Lurline Hare , who verbally acknowledged these results. Electronically Signed   By: Kristine Garbe M.D.   On: 05/27/2018 03:30    Assessment and Plan: This is a 62 y.o. female with acute diverticulitis with small abscess.  I have independently viewed the patient's imaging study and reviewed her laboratory  studies.  Overall, her CT does show distal sigmoid inflammation and stranding as well as a small air-fluid collection consistent with abscess.  Her WBC is elevated but otherwise she's not toxic.  There is no emergent surgical need.  She was admitted overnight for management of her diverticulitis.  Discussed with the patient the different degrees of perforation and the management and for now, the abscess that she has I believe is very small that it may not be amenable for drainage based on size alone.  Her pain is better this morning compared to last night.  Will treat conservatively and has been started on IV fluids, NPO diet, IV Zosyn, and appropriate pain and nausea control.  Will see how her exam improves and then slowly advance her diet.  She is aware that if there is no improvement or if there is any worsening, she may require surgery.   Melvyn Neth, MD [facility]g:  [phone]

## 2018-05-27 NOTE — ED Provider Notes (Signed)
[facility] Emergency Department Provider Note   ____________________________________________   First MD Initiated Contact with Patient 05/27/18 [phone]     (approximate)  I have reviewed the triage vital signs and the nursing notes.   HISTORY  Chief Complaint Abdominal Pain    HPI Linda Bradford is a 62 y.o. female who presents to the ED from home with a chief complaint of abdominal pain, nausea and dysuria.  Symptoms started approximately 2 hours prior to arrival.  Complains of left flank, left lower quadrant, suprapubic pain associated with dysuria and nausea.  Denies associated fever, chills, chest pain, shortness of breath, vomiting, diarrhea.  Took a successful enema yesterday for constipation.   Past Medical History:  Diagnosis Date  . Anxiety   . COPD (chronic obstructive pulmonary disease) ([place])   . Depression   . Diabetes mellitus without complication ([place])   . Fatigue   . Headache   . Hypertension   . Thyroid disease     Patient Active Problem List   Diagnosis Date Noted  . Diabetes mellitus without complication ([place]) 57/26/2035  . Lateral epicondylitis 07/07/2017  . Personal history of tobacco use, presenting hazards to health 05/03/2016  . Bronchitis, chronic ([place]) 03/03/2015  . Depression, major, recurrent, moderate ([place]) 11/05/2014  . Anxiety, generalized 11/05/2014  . Alcohol abuse, in remission 11/05/2014  . Nicotine addiction 11/05/2014  . H/O: osteoarthritis 11/05/2014  . Fibromyalgia 11/05/2014  . Restless leg syndrome 11/05/2014  . Arthralgia of hip 01/13/2014  . Adiposity 01/13/2014  . Disordered sleep 01/13/2014  . Airway hyperreactivity 12/17/2013  . Adult hypothyroidism 12/17/2013  . Compulsive tobacco user syndrome 12/17/2013  . Current tobacco use 12/17/2013  . Basal cell carcinoma 11/04/2013  . Arthropathia 02/08/2012  . Arthritis of knee, left 02/08/2012    Past Surgical History:  Procedure Laterality Date    . APPENDECTOMY    . TUBAL LIGATION      Prior to Admission medications   Medication Sig Start Date End Date Taking? Authorizing Provider  acetaminophen (TYLENOL) 500 MG tablet Take 1,000 mg by mouth every 6 (six) hours as needed.    [provider]  albuterol (PROVENTIL HFA;VENTOLIN HFA) 108 (90 Base) MCG/ACT inhaler Inhale 1 puff into the lungs every 4 (four) hours as needed. 08/04/17   Mar Daring, PA-C  amoxicillin-clavulanate (AUGMENTIN) 875-125 MG tablet Take 1 tablet by mouth 2 (two) times daily. 05/15/18   Mar Daring, PA-C  benzonatate (TESSALON) 200 MG capsule Take 1 capsule (200 mg total) by mouth 3 (three) times daily as needed for cough. 04/17/18   Mar Daring, PA-C  BREO ELLIPTA 200-25 MCG/INH AEPB INHALE 1 PUFF BY MOUTH ONCE DAILY 01/25/18   Mar Daring, PA-C  clonazePAM (KLONOPIN) 0.5 MG tablet TAKE 1/2 TABLET BY MOUTH TWICE DAILY AS NEEDED 02/23/18   Mar Daring, PA-C  Cyanocobalamin (RA VITAMIN B-12 TR) 1000 MCG TBCR Take 1 tablet by mouth daily.     [provider]  Dextromethorphan-guaiFENesin ([place] FAST-MAX DM MAX) 5-100 MG/5ML LIQD Take 5 mLs by mouth 2 (two) times daily. Patient not taking: Reported on 03/28/2018 11/27/17   Mar Daring, PA-C  diclofenac sodium (VOLTAREN) 1 % GEL Apply 4 g topically 4 (four) times daily as needed. 03/01/18   Mar Daring, PA-C  DULoxetine (CYMBALTA) 60 MG capsule TAKE 1 CAPSULE BY MOUTH TWICE DAILY 02/23/18   Mar Daring, PA-C  ezetimibe (ZETIA) 10 MG tablet TAKE 1 TABLET BY  MOUTH ONCE DAILY 12/01/17   Mar Daring, PA-C  fexofenadine (ALLEGRA) 180 MG tablet Take 1 tablet (180 mg total) by mouth daily. Patient not taking: Reported on 05/15/2018 10/10/16   Mar Daring, PA-C  fluticasone [facility]) 50 MCG/ACT nasal spray Place 2 sprays daily into both nostrils. Patient not taking: Reported on 05/15/2018 06/16/17   Mar Daring, PA-C   gabapentin (NEURONTIN) 300 MG capsule Take 1200mg  (4 capsules) at bedtime, take 300mg  (1 capsule) in the morning 03/28/18   Mar Daring, PA-C  glucose blood (CONTOUR NEXT TEST) test strip To check blood sugar once daily 06/09/17   Mar Daring, PA-C  ipratropium-albuterol (DUONEB) 0.5-2.5 (3) MG/3ML SOLN Take 3 mLs by nebulization every 6 (six) hours as needed. 11/04/15   Mar Daring, PA-C  levothyroxine (SYNTHROID, LEVOTHROID) 75 MCG tablet TAKE 1 TABLET BY MOUTH ONCE DAILY ON AN EMPTY STOMACH. WAIT 30 MINUTES BEFORE TAKING OTHER MEDS. 02/23/18   Mar Daring, PA-C  losartan (COZAAR) 50 MG tablet TAKE 1 TABLET BY MOUTH ONCE DAILY 10/26/17   Mar Daring, PA-C  meclizine (ANTIVERT) 25 MG tablet Take 1 tablet (25 mg total) by mouth 3 (three) times daily as needed for dizziness. 05/15/18   Mar Daring, PA-C  meloxicam (MOBIC) 15 MG tablet TAKE 1 TABLET BY MOUTH ONCE DAILY. 03/28/18   Mar Daring, PA-C  metFORMIN (GLUCOPHAGE-XR) 500 MG 24 hr tablet TAKE 2 TABLETS BY MOUTH ONCE DAILY WITH SUPPER 03/01/18   Mar Daring, PA-C  ondansetron (ZOFRAN) 4 MG tablet TAKE 1 TABLET BY MOUTH EVERY 8 HOURS AS NEEDED Patient not taking: Reported on 05/15/2018 05/10/18   Mar Daring, PA-C  predniSONE (DELTASONE) 10 MG tablet Take 6 tabs PO on day 1&2, 5 tabs PO on day 3&4, 4 tabs PO on day 5&6, 3 tabs PO on day 7&8, 2 tabs PO on day 9&10, 1 tab PO on day 11&12. 05/15/18   Mar Daring, PA-C  THEO-24 300 MG 24 hr capsule TAKE 1 CAPSULE BY MOUTH ONCE DAILY 12/01/17   Mar Daring, PA-C  tiZANidine (ZANAFLEX) 4 MG capsule TAKE 1 CAPSULE BY MOUTH 3 TIMES DAILY 02/19/18   Mar Daring, PA-C  varenicline (CHANTIX PAK) 0.5 MG X 11 & 1 MG X 42 tablet Take one 0.5 mg tab PO once daily for 3 days, then increase to one 0.5 mg tablet twice daily for 4 days, then increase to one 1 mg tab BID. Patient not taking: Reported on 12/28/2017 11/27/17    Mar Daring, PA-C  Vitamin D, Ergocalciferol, (DRISDOL) 50000 units CAPS capsule TAKE 1 CAPSULE BY MOUTH EVERY 7 DAYS 12/01/17   Mar Daring, PA-C    Allergies Dexilant [dexlansoprazole]  Family History  Problem Relation Age of Onset  . Lung cancer Mother   . Alcohol abuse Sister   . Arthritis Sister   . Alcohol abuse Brother   . Heart disease Brother 84  . Heart attack Brother   . Pancreatic cancer Maternal Aunt   . Diabetes Maternal Aunt   . Alcohol abuse Son   . Drug abuse Son   . Drug abuse Son   . Heart disease Father 90  . Diabetes Sister   . Breast cancer Brother   . Alcohol abuse Maternal Uncle   . Alcohol abuse Maternal Grandmother   . Diabetes Maternal Grandmother     Social History Social History   Tobacco Use  . Smoking status:  Current Every Day Smoker    Packs/day: 1.00    Years: 45.00    Pack years: 45.00    Types: Cigarettes    Start date: 03/02/1980  . Smokeless tobacco: Never Used  . Tobacco comment: every other day  Substance Use Topics  . Alcohol use: No    Alcohol/week: 0.0 standard drinks  . Drug use: No    Review of Systems  Constitutional: No fever/chills Eyes: No visual changes. ENT: No sore throat. Cardiovascular: Denies chest pain. Respiratory: Denies shortness of breath. Gastrointestinal: Positive for lower abdominal pain and nausea, no vomiting.  No diarrhea.  No constipation. Genitourinary: Negative for dysuria. Musculoskeletal: Negative for back pain. Skin: Negative for rash. Neurological: Negative for headaches, focal weakness or numbness.   ____________________________________________   PHYSICAL EXAM:  VITAL SIGNS: ED Triage Vitals  Enc Vitals Group     BP 05/26/18 2134 (!) 151/50     Pulse Rate 05/26/18 2134 85     Resp 05/26/18 2134 18     Temp 05/26/18 2136 97.8 F (36.6 C)     Temp Source 05/26/18 2136 Oral     SpO2 05/26/18 2134 97 %     Weight 05/26/18 2134 218 lb (98.9 kg)     Height  05/26/18 2134 5\' 9"  (1.753 m)     Head Circumference --      Peak Flow --      Pain Score 05/26/18 2133 10     Pain Loc --      Pain Edu? --      Excl. in [place]? --     Constitutional: Alert and oriented. Well appearing and in moderate acute distress. Eyes: Conjunctivae are normal. PERRL. EOMI. Head: Atraumatic. Nose: No congestion/rhinnorhea. Mouth/Throat: Mucous membranes are moist.  Oropharynx non-erythematous. Neck: No stridor.   Cardiovascular: Normal rate, regular rhythm. Grossly normal heart sounds.  Good peripheral circulation. Respiratory: Normal respiratory effort.  No retractions. Lungs CTAB. Gastrointestinal: Soft and moderately tender to palpation suprapubic and left lower quadrant without rebound or guarding. No distention. No abdominal bruits. No CVA tenderness. Musculoskeletal: No lower extremity tenderness nor edema.  No joint effusions. Neurologic:  Normal speech and language. No gross focal neurologic deficits are appreciated. No gait instability. Skin:  Skin is warm, dry and intact. No rash noted. Psychiatric: Mood and affect are normal. Speech and behavior are normal.  ____________________________________________   LABS (all labs ordered are listed, but only abnormal results are displayed)  Labs Reviewed  COMPREHENSIVE METABOLIC PANEL - Abnormal; Notable for the following components:      Result Value   Glucose, Bld 165 (*)    All other components within normal limits  CBC - Abnormal; Notable for the following components:   WBC 14.4 (*)    Hemoglobin 15.1 (*)    All other components within normal limits  URINALYSIS, COMPLETE (UACMP) WITH MICROSCOPIC - Abnormal; Notable for the following components:   Color, Urine YELLOW (*)    APPearance CLOUDY (*)    All other components within normal limits  URINE CULTURE   ____________________________________________  EKG  None ____________________________________________  RADIOLOGY  ED MD interpretation: Spoke  with Dr. Toney Reil from radiology: Perforated diverticulitis  Official radiology report(s): Ct Renal Stone Study  Result Date: 05/27/2018 CLINICAL DATA:  62 y/o F; lower abdominal pain, nausea, and dysuria. EXAM: CT ABDOMEN AND PELVIS WITHOUT CONTRAST TECHNIQUE: Multidetector CT imaging of the abdomen and pelvis was performed following the standard protocol without IV contrast. COMPARISON:  05/03/2016  CT chest FINDINGS: Lower chest: Stable 5 mm perifissural nodule in the right middle lobe compatible benign etiology. Hepatobiliary: No focal liver abnormality is seen. No gallstones, gallbladder wall thickening, or biliary dilatation. Pancreas: Unremarkable. No pancreatic ductal dilatation or surrounding inflammatory changes. Spleen: Splenic calcification compatible with prior granulomatous disease. No additional abnormality. Normal size. Adrenals/Urinary Tract: Adrenal glands are unremarkable. Kidneys are normal, without renal calculi, focal lesion, or hydronephrosis. Bladder is unremarkable. Stomach/Bowel: Acute perforated sigmoid diverticulitis with adjacent 2.5 x 2.4 cm air and fluid-filled collection within the left pelvis (AP by ML series 2, image 70). No obstructive or inflammatory changes of the small bowel. Normal stomach. 2.2 cm periampullary duodenum diverticulum without inflammatory change. Appendectomy. Vascular/Lymphatic: Aortic atherosclerosis. No enlarged abdominal or pelvic lymph nodes. Reproductive: Uterus and bilateral adnexa are unremarkable. Other: No abdominal wall hernia or abnormality. No abdominopelvic ascites. Musculoskeletal: No fracture is seen. Mild osteoarthrosis of the hip joints with acetabular osteophytosis and fibrocystic degeneration. Lumbar spondylosis greatest at L5-S1 with there is moderate loss of intervertebral disc space height. IMPRESSION: Acute perforated sigmoid diverticulitis with adjacent 2.5 cm air and fluid-filled collection within the left pelvis. These results were  called by telephone at the time of interpretation on 05/27/2018 at 3:29 am to Dr. Lurline Hare , who verbally acknowledged these results. Electronically Signed   By: Kristine Garbe M.D.   On: 05/27/2018 03:30    ____________________________________________   PROCEDURES  Procedure(s) performed: None  Procedures  Critical Care performed: Yes, see critical care note(s)   CRITICAL CARE Performed by: Paulette Blanch   Total critical care time: 30 minutes  Critical care time was exclusive of separately billable procedures and treating other patients.  Critical care was necessary to treat or prevent imminent or life-threatening deterioration.  Critical care was time spent personally by me on the following activities: development of treatment plan with patient and/or surrogate as well as nursing, discussions with consultants, evaluation of patient's response to treatment, examination of patient, obtaining history from patient or surrogate, ordering and performing treatments and interventions, ordering and review of laboratory studies, ordering and review of radiographic studies, pulse oximetry and re-evaluation of patient's condition.  ____________________________________________   INITIAL IMPRESSION / ASSESSMENT AND PLAN / ED COURSE  As part of my medical decision making, I reviewed the following data within the [place] History obtained from family, Nursing notes reviewed and incorporated, Labs reviewed, Old chart reviewed, Radiograph reviewed, Discussed with admitting physician and Notes from prior ED visits   62 year old female who presents with lower abdominal pain, nausea and dysuria. Differential diagnosis includes, but is not limited to, ovarian cyst, ovarian torsion, acute appendicitis, diverticulitis, urinary tract infection/pyelonephritis, endometriosis, bowel obstruction, colitis, renal colic, gastroenteritis, hernia, fibroids, endometriosis, pregnancy  related pain including ectopic pregnancy, etc.  Laboratory results remarkable for moderate leukocytosis.  Given patient's left flank and lower quadrant abdominal pain associated with urinary symptoms, will proceed with CT renal colic study.  Initiate IV fluid resuscitation, 1 mg IV Dilaudid for pain paired with 4 mg IV Zofran for nausea.   Clinical Course as of May 27 345  Sun May 27, 2018  0337 Updated patient and spouse on CT imaging result.  Will discuss with Dr. Hampton Abbot from general surgery for admission.   [JS]    Clinical Course User Index [JS] Paulette Blanch, MD     ____________________________________________   FINAL CLINICAL IMPRESSION(S) / ED DIAGNOSES  Final diagnoses:  Lower abdominal pain  Perforation of  sigmoid colon due to diverticulitis     ED Discharge Orders    None       Note:  This document was prepared using Dragon voice recognition software and may include unintentional dictation errors.    Paulette Blanch, MD 05/27/18 [phone]

## 2018-05-28 LAB — CBC WITH DIFFERENTIAL/PLATELET
Abs Immature Granulocytes: 0.07 10*3/uL (ref 0.00–0.07)
Basophils Absolute: 0 10*3/uL (ref 0.0–0.1)
Basophils Relative: 0 %
Eosinophils Absolute: 0.2 10*3/uL (ref 0.0–0.5)
Eosinophils Relative: 1 %
HCT: 40.1 % (ref 36.0–46.0)
Hemoglobin: 12.9 g/dL (ref 12.0–15.0)
Immature Granulocytes: 1 %
Lymphocytes Relative: 14 %
Lymphs Abs: 1.7 10*3/uL (ref 0.7–4.0)
MCH: 30.8 pg (ref 26.0–34.0)
MCHC: 32.2 g/dL (ref 30.0–36.0)
MCV: 95.7 fL (ref 80.0–100.0)
Monocytes Absolute: 0.9 10*3/uL (ref 0.1–1.0)
Monocytes Relative: 7 %
Neutro Abs: 9.7 10*3/uL — ABNORMAL HIGH (ref 1.7–7.7)
Neutrophils Relative %: 77 %
Platelets: 215 10*3/uL (ref 150–400)
RBC: 4.19 MIL/uL (ref 3.87–5.11)
RDW: 13.1 % (ref 11.5–15.5)
WBC: 12.6 10*3/uL — ABNORMAL HIGH (ref 4.0–10.5)
nRBC: 0 % (ref 0.0–0.2)

## 2018-05-28 LAB — BASIC METABOLIC PANEL
Anion gap: 7 (ref 5–15)
BUN: 12 mg/dL (ref 8–23)
CO2: 25 mmol/L (ref 22–32)
Calcium: 8.3 mg/dL — ABNORMAL LOW (ref 8.9–10.3)
Chloride: 104 mmol/L (ref 98–111)
Creatinine, Ser: 0.56 mg/dL (ref 0.44–1.00)
GFR calc Af Amer: >60 mL/min (ref >60)
GFR calc non Af Amer: >60 mL/min (ref >60)
Glucose, Bld: 141 mg/dL — ABNORMAL HIGH (ref 70–99)
Potassium: 3.7 mmol/L (ref 3.5–5.1)
Sodium: 136 mmol/L (ref 135–145)

## 2018-05-28 LAB — GLUCOSE, CAPILLARY
Glucose-Capillary: 121 mg/dL — ABNORMAL HIGH (ref 70–99)
Glucose-Capillary: 143 mg/dL — ABNORMAL HIGH (ref 70–99)
Glucose-Capillary: 146 mg/dL — ABNORMAL HIGH (ref 70–99)
Glucose-Capillary: 165 mg/dL — ABNORMAL HIGH (ref 70–99)
Glucose-Capillary: 185 mg/dL — ABNORMAL HIGH (ref 70–99)
Glucose-Capillary: 97 mg/dL (ref 70–99)

## 2018-05-28 LAB — URINE CULTURE: Culture: <10000 — AB

## 2018-05-28 MED ORDER — FAMOTIDINE 20 MG PO TABS
20.0000 mg | ORAL_TABLET | Freq: Two times a day (BID) | ORAL | Status: DC
  Administered 2018-05-28 – 2018-06-01 (×8): 20 mg via ORAL
  Filled 2018-05-28 (×8): qty 1

## 2018-05-28 MED ORDER — ACETAMINOPHEN 500 MG PO TABS
1000.0000 mg | ORAL_TABLET | Freq: Four times a day (QID) | ORAL | Status: DC | PRN
  Administered 2018-05-28 (×2): 1000 mg via ORAL
  Filled 2018-05-28 (×2): qty 2

## 2018-05-28 MED ORDER — LACTATED RINGERS IV SOLN: INTRAVENOUS | Status: DC

## 2018-05-28 NOTE — Progress Notes (Signed)
PHARMACIST - PHYSICIAN COMMUNICATION  DR:   Hampton Abbot  CONCERNING: IV to Oral Route Change Policy  RECOMMENDATION: This patient is receiving famotidine by the intravenous route.  Based on criteria approved by the Pharmacy and Therapeutics Committee, the intravenous medication(s) is/are being converted to the equivalent oral dose form(s).   DESCRIPTION: These criteria include:  The patient is eating (either orally or via tube) and/or has been taking other orally administered medications for a least 24 hours  The patient has no evidence of active gastrointestinal bleeding or impaired GI absorption (gastrectomy, short bowel, patient on TNA or NPO).  If you have questions about this conversion, please contact the Pharmacy Department  []   [phone] )  [facility] [x]   [phone] )  [facility] []   [phone] )  [facility] []   [phone] )  [facility] []   [phone] )  [place], [facility] 05/28/2018 1:53 PM

## 2018-05-28 NOTE — Progress Notes (Signed)
05/28/2018  Subjective: No acute events.  Patient reports her pain is much better.  No nausea or vomiting.  No bowel movement.  Vital signs: Temp:  [97.9 F (36.6 C)-99.1 F (37.3 C)] 97.9 F (36.6 C) (10/28 1059) Pulse Rate:  [88-97] 88 (10/28 1059) Resp:  [18-20] 18 (10/28 1059) BP: (135-148)/(56-70) 143/66 (10/28 1059) SpO2:  [92 %-95 %] 95 % (10/28 1059)   Intake/Output: 10/27 0701 - 10/28 0700 In: 2812.5 [I.V.:2624; IV Piggyback:188.4] Out: 1700 [Urine:1700] Last BM Date: 05/26/18  Physical Exam: Constitutional: No acute distress Abdomen:  Soft, non-distended, obese, with mild tenderness with deep palpation over the left lower quadrant, improved compared to yesterday.  Labs:  Recent Labs    05/27/18 0531 05/28/18 0452  WBC 13.8* 12.6*  HGB 13.2 12.9  HCT 41.3 40.1  PLT 231 215   Recent Labs    05/27/18 0531 05/28/18 0452  NA 138 136  K 4.5 3.7  CL 106 104  CO2 27 25  GLUCOSE 197* 141*  BUN 19 12  CREATININE 0.65 0.56  CALCIUM 8.7* 8.3*   No results for input(s): LABPROT, INR in the last 72 hours.  Imaging: No results found.  Assessment/Plan: This is a 62 y.o. female with acute diverticulitis with small abscess  --will advance diet to clear liquids today.  Possible full liquids for dinner if tolerates well. --decrease IV fluids --continue IV abx and home medications.   Melvyn Neth, [facility]

## 2018-05-29 LAB — CBC WITH DIFFERENTIAL/PLATELET
Abs Immature Granulocytes: 0.05 10*3/uL (ref 0.00–0.07)
Basophils Absolute: 0 10*3/uL (ref 0.0–0.1)
Basophils Relative: 0 %
Eosinophils Absolute: 0.2 10*3/uL (ref 0.0–0.5)
Eosinophils Relative: 2 %
HCT: 38.3 % (ref 36.0–46.0)
Hemoglobin: 12.5 g/dL (ref 12.0–15.0)
Immature Granulocytes: 1 %
Lymphocytes Relative: 21 %
Lymphs Abs: 2 10*3/uL (ref 0.7–4.0)
MCH: 30.9 pg (ref 26.0–34.0)
MCHC: 32.6 g/dL (ref 30.0–36.0)
MCV: 94.8 fL (ref 80.0–100.0)
Monocytes Absolute: 0.7 10*3/uL (ref 0.1–1.0)
Monocytes Relative: 8 %
Neutro Abs: 6.8 10*3/uL (ref 1.7–7.7)
Neutrophils Relative %: 68 %
Platelets: 229 10*3/uL (ref 150–400)
RBC: 4.04 MIL/uL (ref 3.87–5.11)
RDW: 12.8 % (ref 11.5–15.5)
WBC: 9.8 10*3/uL (ref 4.0–10.5)
nRBC: 0 % (ref 0.0–0.2)

## 2018-05-29 LAB — GLUCOSE, CAPILLARY
Glucose-Capillary: 202 mg/dL — ABNORMAL HIGH (ref 70–99)
Glucose-Capillary: 128 mg/dL — ABNORMAL HIGH (ref 70–99)
Glucose-Capillary: 116 mg/dL — ABNORMAL HIGH (ref 70–99)
Glucose-Capillary: 176 mg/dL — ABNORMAL HIGH (ref 70–99)
Glucose-Capillary: 155 mg/dL — ABNORMAL HIGH (ref 70–99)

## 2018-05-29 MED ORDER — NICOTINE 21 MG/24HR TD PT24
21.0000 mg | MEDICATED_PATCH | Freq: Every day | TRANSDERMAL | Status: DC
  Administered 2018-05-29 – 2018-06-01 (×4): 21 mg via TRANSDERMAL
  Filled 2018-05-29 (×4): qty 1

## 2018-05-29 MED ORDER — INSULIN ASPART 100 UNIT/ML ~~LOC~~ SOLN
0.0000 [IU] | Freq: Three times a day (TID) | SUBCUTANEOUS | Status: DC
  Administered 2018-05-30 – 2018-05-31 (×4): 3 [IU] via SUBCUTANEOUS
  Administered 2018-05-31 – 2018-06-01 (×2): 4 [IU] via SUBCUTANEOUS
  Administered 2018-06-01: 3 [IU] via SUBCUTANEOUS
  Filled 2018-05-29 (×7): qty 1

## 2018-05-29 NOTE — Progress Notes (Signed)
05/29/2018  Subjective: No acute events overnight.  Patient had clear liquid diet yesterday and advanced to full liquid for dinner.  She reports mild discomfort in LLQ with meals, but not severe as when she came in.  Her WBC normalized this morning.  Vital signs: Temp:  [97.5 F (36.4 C)-98.2 F (36.8 C)] 97.5 F (36.4 C) (10/29 0415) Pulse Rate:  [73-80] 73 (10/29 0415) Resp:  [16] 16 (10/29 0415) BP: (126-130)/(53-60) 130/60 (10/29 0415) SpO2:  [95 %-96 %] 96 % (10/29 0415)   Intake/Output: 10/28 0701 - 10/29 0700 In: 1854.9 [P.O.:1750; IV Piggyback:104.9] Out: 400 [Urine:400] Last BM Date: 05/26/18  Physical Exam: Constitutional: No acute distress Abdomen:  Soft, obese, non-distended, with mild discomfort to palpation in LLQ.  Non peritoneal or worsening.  Labs:  Recent Labs    05/28/18 0452 05/29/18 0303  WBC 12.6* 9.8  HGB 12.9 12.5  HCT 40.1 38.3  PLT 215 229   Recent Labs    05/27/18 0531 05/28/18 0452  NA 138 136  K 4.5 3.7  CL 106 104  CO2 27 25  GLUCOSE 197* 141*  BUN 19 12  CREATININE 0.65 0.56  CALCIUM 8.7* 8.3*   No results for input(s): LABPROT, INR in the last 72 hours.  Imaging: No results found.  Assessment/Plan: This is a 62 y.o. female with acute diverticulitis with small abscess  --Will continue full liquid diet for today given the mild discomfort with meals.  Once this is improved, will advance to soft diet, possibly tomorrow. --Continue IV abx today and transition to po tomorrow. --Possible discharge tomorrow depending if diet advanced and pain resolved.   Melvyn Neth, [facility]

## 2018-05-30 ENCOUNTER — Encounter: Payer: Self-pay | Admitting: Radiology

## 2018-05-30 ENCOUNTER — Inpatient Hospital Stay: Payer: BLUE CROSS/BLUE SHIELD

## 2018-05-30 LAB — GLUCOSE, CAPILLARY
Glucose-Capillary: 140 mg/dL — ABNORMAL HIGH (ref 70–99)
Glucose-Capillary: 118 mg/dL — ABNORMAL HIGH (ref 70–99)
Glucose-Capillary: 129 mg/dL — ABNORMAL HIGH (ref 70–99)
Glucose-Capillary: 112 mg/dL — ABNORMAL HIGH (ref 70–99)

## 2018-05-30 LAB — CBC
HCT: 36.7 % (ref 36.0–46.0)
Hemoglobin: 12.1 g/dL (ref 12.0–15.0)
MCH: 31 pg (ref 26.0–34.0)
MCHC: 33 g/dL (ref 30.0–36.0)
MCV: 94.1 fL (ref 80.0–100.0)
Platelets: 239 10*3/uL (ref 150–400)
RBC: 3.9 MIL/uL (ref 3.87–5.11)
RDW: 12.8 % (ref 11.5–15.5)
WBC: 8.4 10*3/uL (ref 4.0–10.5)
nRBC: 0 % (ref 0.0–0.2)

## 2018-05-30 MED ORDER — IOPAMIDOL (ISOVUE-300) INJECTION 61%
15.0000 mL | INTRAVENOUS | Status: AC
  Administered 2018-05-30 (×2): 15 mL via ORAL

## 2018-05-30 MED ORDER — SODIUM CHLORIDE 0.9 % IV SOLN
INTRAVENOUS | Status: DC | PRN
  Administered 2018-05-30 – 2018-05-31 (×2): 250 mL via INTRAVENOUS

## 2018-05-30 MED ORDER — IOPAMIDOL (ISOVUE-300) INJECTION 61%
100.0000 mL | Freq: Once | INTRAVENOUS | Status: AC | PRN
  Administered 2018-05-30: 100 mL via INTRAVENOUS

## 2018-05-30 MED ORDER — SODIUM CHLORIDE 0.9 % IV SOLN: INTRAVENOUS | Status: DC

## 2018-05-30 NOTE — Progress Notes (Signed)
05/30/2018  Subjective: No acute events overnight.  Patient reports that she still having left lower quadrant discomfort and that this may have actually worsened yesterday.  Denies any fevers or chills.  She had otherwise been tolerating a full liquid diet.  Vital signs: Temp:  [97.6 F (36.4 C)-98.2 F (36.8 C)] 98.2 F (36.8 C) (10/30 0508) Pulse Rate:  [82-85] 83 (10/30 0508) Resp:  [14-18] 18 (10/30 0508) BP: (140-147)/(56-71) 145/71 (10/30 0508) SpO2:  [94 %-97 %] 94 % (10/30 0508)   Intake/Output: 10/29 0701 - 10/30 0700 In: 505.2 [P.O.:360; IV Piggyback:145.2] Out: 600 [Urine:600] Last BM Date: 05/29/18  Physical Exam: Constitutional: No acute distress Abdomen: Soft, nondistended, obese, with tenderness to palpation in the left lower quadrant.  This is worse than it had been yesterday.  Labs:  Recent Labs    05/29/18 0303 05/30/18 0409  WBC 9.8 8.4  HGB 12.5 12.1  HCT 38.3 36.7  PLT 229 239   Recent Labs    05/28/18 0452  NA 136  K 3.7  CL 104  CO2 25  GLUCOSE 141*  BUN 12  CREATININE 0.56  CALCIUM 8.3*   No results for input(s): LABPROT, INR in the last 72 hours.  Imaging: No results found.  Assessment/Plan: This is a 62 y.o. female with diverticulitis with small abscess.  -Given that her pain is worse today even though her white blood cell count is normal, will order a CT scan of the abdomen pelvis with contrast to evaluate for any worsening abscess. -If there is an abscess that can be drained, will contact interventional radiology for this.  We will keep her n.p.o. today in case any procedures can be done.   Melvyn Neth, [facility]

## 2018-05-31 ENCOUNTER — Inpatient Hospital Stay: Payer: BLUE CROSS/BLUE SHIELD

## 2018-05-31 DIAGNOSIS — K5720 Diverticulitis of large intestine with perforation and abscess without bleeding: Principal | ICD-10-CM

## 2018-05-31 LAB — GLUCOSE, CAPILLARY
Glucose-Capillary: 127 mg/dL — ABNORMAL HIGH (ref 70–99)
Glucose-Capillary: 102 mg/dL — ABNORMAL HIGH (ref 70–99)
Glucose-Capillary: 126 mg/dL — ABNORMAL HIGH (ref 70–99)
Glucose-Capillary: 171 mg/dL — ABNORMAL HIGH (ref 70–99)

## 2018-05-31 LAB — PROTIME-INR
INR: 1.04
Prothrombin Time: 13.5 seconds (ref 11.4–15.2)

## 2018-05-31 MED ORDER — FENTANYL CITRATE (PF) 100 MCG/2ML IJ SOLN
INTRAMUSCULAR | Status: AC
  Administered 2018-05-31: 12:00:00
  Filled 2018-05-31: qty 4

## 2018-05-31 MED ORDER — FENTANYL CITRATE (PF) 100 MCG/2ML IJ SOLN
INTRAMUSCULAR | Status: AC | PRN
  Administered 2018-05-31: 25 ug via INTRAVENOUS
  Administered 2018-05-31: 50 ug via INTRAVENOUS
  Administered 2018-05-31: 25 ug via INTRAVENOUS

## 2018-05-31 MED ORDER — OXYCODONE HCL 5 MG PO TABS
5.0000 mg | ORAL_TABLET | ORAL | Status: DC | PRN
  Administered 2018-05-31 – 2018-06-01 (×3): 10 mg via ORAL
  Filled 2018-05-31: qty 1
  Filled 2018-05-31 (×3): qty 2

## 2018-05-31 MED ORDER — MIDAZOLAM HCL 5 MG/5ML IJ SOLN
INTRAMUSCULAR | Status: AC
  Administered 2018-05-31: 12:00:00
  Filled 2018-05-31: qty 5

## 2018-05-31 MED ORDER — ACETAMINOPHEN 500 MG PO TABS
1000.0000 mg | ORAL_TABLET | Freq: Four times a day (QID) | ORAL | Status: DC
  Administered 2018-05-31 – 2018-06-01 (×4): 1000 mg via ORAL
  Filled 2018-05-31 (×4): qty 2

## 2018-05-31 MED ORDER — MIDAZOLAM HCL 5 MG/5ML IJ SOLN
INTRAMUSCULAR | Status: AC | PRN
  Administered 2018-05-31 (×3): 0.5 mg via INTRAVENOUS
  Administered 2018-05-31 (×2): 1 mg via INTRAVENOUS

## 2018-05-31 MED ORDER — ENOXAPARIN SODIUM 40 MG/0.4ML ~~LOC~~ SOLN
40.0000 mg | SUBCUTANEOUS | Status: DC
  Administered 2018-05-31: 40 mg via SUBCUTANEOUS
  Filled 2018-05-31: qty 0.4

## 2018-05-31 MED ORDER — SODIUM CHLORIDE 0.9% FLUSH
5.0000 mL | Freq: Three times a day (TID) | INTRAVENOUS | Status: DC
  Administered 2018-05-31 – 2018-06-01 (×3): 5 mL

## 2018-05-31 NOTE — Procedures (Signed)
Pre procedural Dx: Diverticular Abscess Post procedural Dx: Same  Technically successful CT guided placed of a 10 Fr drainage catheter placement into the left lower abdomen/pelvis yielding 5 cc of purulent fluid.    All aspirated samples sent to the laboratory for analysis.    EBL: None  Complications: None immediate  Ronny Bacon, MD Pager #: [phone]

## 2018-05-31 NOTE — Progress Notes (Signed)
[facility] Progress Note     Subjective: She is resting comfortably in bed. No acute events overnight. She notes mild LLQ discomfort however this has improved. No complaints of nausea or emesis. She is NPO this morning, but she had previously been tolerating a diet. Mobilizing well. No fevers or chills.    Objective: Vital signs in last 24 hours: Temp:  [98 F (36.7 C)-98.5 F (36.9 C)] 98.1 F (36.7 C) (10/31 0434) Pulse Rate:  [78-79] 79 (10/31 0434) Resp:  [12-18] 12 (10/31 0434) BP: (122-150)/(55-70) 122/55 (10/31 0434) SpO2:  [93 %-97 %] 93 % (10/31 0434) Last BM Date: 05/29/18  Intake/Output from previous day: 10/30 0701 - 10/31 0700 In: 114.2 [I.V.:0.3; IV Piggyback:113.9] Out: -  Intake/Output this shift: No intake/output data recorded.  PE: Gen:  Alert, NAD, pleasant Pulm:  Normal effort Abd: Soft, non-tender, mild LLQ tenderness Skin: warm and dry, no rashes  Psych: A&Ox3   Lab Results:  Recent Labs    05/29/18 0303 05/30/18 0409  WBC 9.8 8.4  HGB 12.5 12.1  HCT 38.3 36.7  PLT 229 239   BMET No results for input(s): NA, K, CL, CO2, GLUCOSE, BUN, CREATININE, CALCIUM in the last 72 hours. PT/INR Recent Labs    05/31/18 0301  LABPROT 13.5  INR 1.04   CMP     Component Value Date/Time   NA 136 05/28/2018 0452   K 3.7 05/28/2018 0452   CL 104 05/28/2018 0452   CO2 25 05/28/2018 0452   GLUCOSE 141 (H) 05/28/2018 0452   BUN 12 05/28/2018 0452   CREATININE 0.56 05/28/2018 0452   CREATININE 0.58 05/19/2017 0823   CALCIUM 8.3 (L) 05/28/2018 0452   PROT 7.5 05/26/2018 2143   ALBUMIN 4.2 05/26/2018 2143   AST 19 05/26/2018 2143   ALT 20 05/26/2018 2143   ALKPHOS 90 05/26/2018 2143   BILITOT 0.6 05/26/2018 2143   GFRNONAA >60 05/28/2018 0452   GFRNONAA 99 05/19/2017 0823   GFRAA >60 05/28/2018 0452   GFRAA 115 05/19/2017 0823   Lipase  No results found for: LIPASE     Studies/Results: Ct Abdomen Pelvis W  Contrast  Result Date: 05/30/2018 CLINICAL DATA:  Follow-up diverticulitis with small abscess. EXAM: CT ABDOMEN AND PELVIS WITH CONTRAST TECHNIQUE: Multidetector CT imaging of the abdomen and pelvis was performed using the standard protocol following bolus administration of intravenous contrast. CONTRAST:  127mL ISOVUE-300 IOPAMIDOL (ISOVUE-300) INJECTION 61% COMPARISON:  05/27/2018 FINDINGS: Lower chest: Unchanged 3 mm nodule in the right middle lobe, image 1/4. No acute abnormalities identified. Hepatobiliary: No focal liver abnormality is seen. No gallstones, gallbladder wall thickening, or biliary dilatation. Pancreas: Unremarkable. No pancreatic ductal dilatation or surrounding inflammatory changes. Spleen: Calcified granulomas identified within the spleen. Adrenals/Urinary Tract: The adrenal glands appear normal. The kidneys appear unremarkable. No mass or hydronephrosis identified. The urinary bladder appears normal. Stomach/Bowel: The stomach is non-distended. The small bowel loops are normal in course and caliber. The proximal colon is unremarkable. Extensive distal colonic diverticula identified. Wall thickening involving the sigmoid colon is again noted within the left side of pelvis compatible with diverticulitis. Small contained fluid collection is again identified with air-fluid level in the left hemipelvis. This measures 2.7 by 2.4 by 2.2 cm. Previously this measured 2.5 x 2.4 by 0.8 cm. No new fluid collections identified. Vascular/Lymphatic: Aortic atherosclerosis. No aneurysm. No abdominopelvic adenopathy identified. Reproductive: Uterus and right ovary appear normal. The left ovary appears closely associated with the area of diverticulitis and  diverticular fluid collection. Other: Small amount of free fluid in fat stranding within the left hemipelvis. Musculoskeletal: Spondylosis identified within the lumbar spine. IMPRESSION: 1. Extensive sigmoid diverticulosis with acute sigmoid  diverticulitis involving the sigmoid colon. The small contained fluid collection with air-fluid level adjacent to the sigmoid colon within the left hemipelvis is slightly increased in volume when compared with 05/27/2018. Cannot rule out persistent communication within lumen of the sigmoid colon. Electronically Signed   By: Kerby Moors M.D.   On: 05/30/2018 13:09    Anti-infectives: Anti-infectives (From admission, onward)   Start     Dose/Rate Route Frequency Ordered Stop   05/27/18 1400  piperacillin-tazobactam (ZOSYN) IVPB 3.375 g     3.375 g 12.5 mL/hr over 240 Minutes Intravenous Every 8 hours 05/27/18 0350     05/27/18 0345  piperacillin-tazobactam (ZOSYN) IVPB 3.375 g     3.375 g 100 mL/hr over 30 Minutes Intravenous  Once 05/27/18 0338 05/27/18 0446       Assessment/Plan  Linda Bradford is a 62 y.o. female with diverticulitis with small enlarging abscess and resolved leukocytosis complicated by pertinent co-morbidities including anxiety, COPD, DM, HTN, obesity, and tobacco abuse (Smoking).   - Given mildly enlarging abscess, IR consulted for percutaneous drain placement which is planned for today.  - NPO for now, okay for clears once IR drain placement - Continue IVF - Continue IV ABx, weill transition to PO following drain placement - Monitor abdominal exam - Mobilize - Hold Lovenox for IR procedure, restart following procedure     LOS: 4 days    Edison Simon , PA-C [facility] 05/31/2018, 10:06 AM [phone] M-F: 7am - 4pm

## 2018-06-01 LAB — CBC WITH DIFFERENTIAL/PLATELET
Abs Immature Granulocytes: 0.04 10*3/uL (ref 0.00–0.07)
Basophils Absolute: 0 10*3/uL (ref 0.0–0.1)
Basophils Relative: 1 %
Eosinophils Absolute: 0.2 10*3/uL (ref 0.0–0.5)
Eosinophils Relative: 2 %
HCT: 40.3 % (ref 36.0–46.0)
Hemoglobin: 12.9 g/dL (ref 12.0–15.0)
Immature Granulocytes: 1 %
Lymphocytes Relative: 25 %
Lymphs Abs: 2.1 10*3/uL (ref 0.7–4.0)
MCH: 30.5 pg (ref 26.0–34.0)
MCHC: 32 g/dL (ref 30.0–36.0)
MCV: 95.3 fL (ref 80.0–100.0)
Monocytes Absolute: 0.6 10*3/uL (ref 0.1–1.0)
Monocytes Relative: 8 %
Neutro Abs: 5.3 10*3/uL (ref 1.7–7.7)
Neutrophils Relative %: 63 %
Platelets: 299 10*3/uL (ref 150–400)
RBC: 4.23 MIL/uL (ref 3.87–5.11)
RDW: 12.7 % (ref 11.5–15.5)
WBC: 8.2 10*3/uL (ref 4.0–10.5)
nRBC: 0 % (ref 0.0–0.2)

## 2018-06-01 LAB — GLUCOSE, CAPILLARY
Glucose-Capillary: 176 mg/dL — ABNORMAL HIGH (ref 70–99)
Glucose-Capillary: 148 mg/dL — ABNORMAL HIGH (ref 70–99)

## 2018-06-01 MED ORDER — SODIUM CHLORIDE 0.9% FLUSH: 5.0000 mL | Freq: Three times a day (TID) | INTRAVENOUS | 0 refills | Status: DC

## 2018-06-01 MED ORDER — AMOXICILLIN-POT CLAVULANATE 875-125 MG PO TABS: 1.0000 | ORAL_TABLET | Freq: Two times a day (BID) | ORAL | Status: DC

## 2018-06-01 MED ORDER — AMOXICILLIN-POT CLAVULANATE 875-125 MG PO TABS: 1.0000 | ORAL_TABLET | Freq: Two times a day (BID) | ORAL | 0 refills | Status: AC

## 2018-06-01 MED ORDER — POLYETHYLENE GLYCOL 3350 17 G PO PACK: 17.0000 g | PACK | Freq: Every day | ORAL | 1 refills | Status: DC

## 2018-06-01 MED ORDER — OXYCODONE HCL 5 MG PO TABS: 5.0000 mg | ORAL_TABLET | ORAL | 0 refills | Status: DC | PRN

## 2018-06-01 MED ORDER — POLYETHYLENE GLYCOL 3350 17 G PO PACK
17.0000 g | PACK | Freq: Every day | ORAL | Status: DC
  Administered 2018-06-01: 17 g via ORAL
  Filled 2018-06-01: qty 1

## 2018-06-01 MED ORDER — NICOTINE 21 MG/24HR TD PT24: 21.0000 mg | MEDICATED_PATCH | Freq: Every day | TRANSDERMAL | 0 refills | Status: DC

## 2018-06-01 NOTE — Discharge Summary (Signed)
Discharge Summary  Patient ID: Linda Bradford MRN: [id number] DOB/AGE: 1955/08/15 62 y.o.  Admit date: 05/27/2018 Discharge date: 06/01/2018  Discharge Diagnoses Acute Diverticulitis   Consultants None  Procedures Percutaneous Draing (Abscess) - 10/31  HPI: Linda Bradford is a 62 y.o. female who presents with a one day history of left lower quadrant abdominal pain, associated with nausea but no emesis. She has not has this type of pain before.  She describes the pain as being severe and sharp, and it was particularly worse while waiting in the ED to be seen.  She denies any fevers, chills, chest pain, shortness of breath, or diarrhea.  She reports that she does have issues with constipation and sometimes that will lead to low pelvic pain, but it resolves after a bowel movement.  She did try an enema yesterday but it did not help with the pain. She was admitted to genera. Surgery for management of acute diverticulitis.   Hospital Course: Over the first 3 hospital days, the patient's pain and WBC did not significantly improve. A CT was obtained on 10/30, which was concerning for slightly enlarging abscess. IR was consulted and a percutaneous drain was placed on 10/31. Following th IR drain placement, the patient's pain and WBC improved/resolved and advancement of patient's diet and ambulation were well-tolerated. The remainder of patient's hospital course was essentially unremarkable, and discharge planning was initiated accordingly with patient safely able to be discharged home with appropriate discharge instructions, antibiotics, pain control, and outpatient follow-up after all of her questions were answered to her expressed satisfaction.  She is to follow up with general surgery in 1 week for reassessment and possible JP drain removal.    Allergies as of 06/01/2018      Reactions   Dexilant [dexlansoprazole] Nausea Only, Other (See Comments)   Dizziness      Medication List    TAKE these  medications   acetaminophen 500 MG tablet Commonly known as:  TYLENOL Take 1,000 mg by mouth every 6 (six) hours as needed for mild pain or moderate pain.   albuterol 108 (90 Base) MCG/ACT inhaler Commonly known as:  PROVENTIL HFA;VENTOLIN HFA Inhale 1 puff into the lungs every 4 (four) hours as needed.   amoxicillin-clavulanate 875-125 MG tablet Commonly known as:  AUGMENTIN Take 1 tablet by mouth every 12 (twelve) hours for 14 days. What changed:  when to take this   benzonatate 200 MG capsule Commonly known as:  TESSALON Take 1 capsule (200 mg total) by mouth 3 (three) times daily as needed for cough.   BREO ELLIPTA 200-25 MCG/INH Aepb Generic drug:  fluticasone furoate-vilanterol INHALE 1 PUFF BY MOUTH ONCE DAILY What changed:  See the new instructions.   clonazePAM 0.5 MG tablet Commonly known as:  KLONOPIN TAKE 1/2 TABLET BY MOUTH TWICE DAILY AS NEEDED   diclofenac sodium 1 % Gel Commonly known as:  VOLTAREN Apply 4 g topically 4 (four) times daily as needed.   DULoxetine 60 MG capsule Commonly known as:  CYMBALTA TAKE 1 CAPSULE BY MOUTH TWICE DAILY   ezetimibe 10 MG tablet Commonly known as:  ZETIA TAKE 1 TABLET BY MOUTH ONCE DAILY   gabapentin 300 MG capsule Commonly known as:  NEURONTIN Take 300-1,200 mg by mouth See admin instructions. Take 1 capsule (300MG ) by mouth every morning and 4 capsules (1200MG ) by mouth every night at bedtime   gabapentin 300 MG capsule Commonly known as:  NEURONTIN Take 1200mg  (4 capsules) at bedtime, take 300mg  (1 capsule)  in the morning   glucose blood test strip To check blood sugar once daily   ipratropium-albuterol 0.5-2.5 (3) MG/3ML Soln Commonly known as:  DUONEB Take 3 mLs by nebulization every 6 (six) hours as needed.   levothyroxine 75 MCG tablet Commonly known as:  SYNTHROID, LEVOTHROID TAKE 1 TABLET BY MOUTH ONCE DAILY ON AN EMPTY STOMACH. WAIT 30 MINUTES BEFORE TAKING OTHER MEDS. What changed:  See the new  instructions.   losartan 50 MG tablet Commonly known as:  COZAAR TAKE 1 TABLET BY MOUTH ONCE DAILY   meclizine 25 MG tablet Commonly known as:  ANTIVERT Take 1 tablet (25 mg total) by mouth 3 (three) times daily as needed for dizziness.   meloxicam 15 MG tablet Commonly known as:  MOBIC TAKE 1 TABLET BY MOUTH ONCE DAILY.   metFORMIN 500 MG 24 hr tablet Commonly known as:  GLUCOPHAGE-XR TAKE 2 TABLETS BY MOUTH ONCE DAILY WITH SUPPER   nicotine 21 mg/24hr patch Commonly known as:  NICODERM CQ - dosed in mg/24 hours Place 1 patch (21 mg total) onto the skin daily. Start taking on:  06/02/2018   oxyCODONE 5 MG immediate release tablet Commonly known as:  Oxy IR/ROXICODONE Take 1 tablet (5 mg total) by mouth every 4 (four) hours as needed for severe pain or breakthrough pain.   polyethylene glycol packet Commonly known as:  MIRALAX / GLYCOLAX Take 17 g by mouth daily. Start taking on:  06/02/2018   predniSONE 10 MG tablet Commonly known as:  DELTASONE Take 6 tabs PO on day 1&2, 5 tabs PO on day 3&4, 4 tabs PO on day 5&6, 3 tabs PO on day 7&8, 2 tabs PO on day 9&10, 1 tab PO on day 11&12.   pyridOXINE 100 MG tablet Commonly known as:  VITAMIN B-6 Take 100 mg by mouth daily.   RA VITAMIN B-12 TR 1000 MCG Tbcr Generic drug:  Cyanocobalamin Take 1,000 mcg by mouth daily.   sodium chloride flush 0.9 % Soln Commonly known as:  NS 5 mLs by Intracatheter route every 8 (eight) hours.   THEO-24 300 MG 24 hr capsule Generic drug:  theophylline TAKE 1 CAPSULE BY MOUTH ONCE DAILY   tiZANidine 4 MG capsule Commonly known as:  ZANAFLEX TAKE 1 CAPSULE BY MOUTH 3 TIMES DAILY What changed:    when to take this  reasons to take this   Vitamin D (Ergocalciferol) 50000 units Caps capsule Commonly known as:  DRISDOL TAKE 1 CAPSULE BY MOUTH EVERY 7 DAYS What changed:  See the new instructions.        Follow-up Information    Piscoya, Jacqulyn Bath, MD. Schedule an appointment as soon  as possible for a visit in 1 week(s).   Specialty:  General Surgery Why:  diverticulitis with abscess hospital follow up, has JP in place Contact information: [address] [place] [address] [phone]           Signed: Edison Simon , PA-C [facility]  06/01/2018, 1:13 PM [phone] M-F: 7am - 4pm

## 2018-06-01 NOTE — Care Management (Signed)
Patient to discharge with percutaneous drain in place.  Bedside RN to educate on drain care prior to discharge. Patient is from home with husband.  PCP Burnette.  Patient is independent of all ADL's. Patient to follow up with surgery on 11/5

## 2018-06-01 NOTE — Discharge Instructions (Signed)
In addition to included general instructions for diverticulitis,  Diet: Resume home heart healthy  diet.   Wound care: Change drain site dressings daily. Keep drain site clean and dry, no baths or submerging incision underwater until follow-up.   Medications: Resume all home medications. For mild to moderate pain: acetaminophen (Tylenol) or ibuprofen/naproxen (if no kidney disease). Combining Tylenol with alcohol can substantially increase your risk of causing liver disease. Narcotic pain medications, if prescribed, can be used for severe pain, though may cause nausea, constipation, and drowsiness. Do not combine Tylenol and Percocet (or similar) within a 6 hour period as Percocet (and similar) contain(s) Tylenol. If you do not need the narcotic pain medication, you do not need to fill the prescription.  Call office [phone] / [phone]) at any time if any questions, worsening pain, fevers/chills, bleeding, drainage from incision site, or other concerns.

## 2018-06-01 NOTE — Progress Notes (Signed)
Discharge order received. Patient is alert and oriented. Vital signs stable . No signs of acute distress. Discharge instructions given. Patient verbalized understanding. No other issues noted at this time.   

## 2018-06-04 ENCOUNTER — Ambulatory Visit: Payer: Self-pay | Admitting: Physician Assistant

## 2018-06-04 ENCOUNTER — Encounter: Payer: Self-pay | Admitting: Surgery

## 2018-06-04 ENCOUNTER — Other Ambulatory Visit: Payer: Self-pay

## 2018-06-04 ENCOUNTER — Ambulatory Visit (INDEPENDENT_AMBULATORY_CARE_PROVIDER_SITE_OTHER): Payer: BLUE CROSS/BLUE SHIELD | Admitting: Surgery

## 2018-06-04 ENCOUNTER — Telehealth: Payer: Self-pay

## 2018-06-04 VITALS — BP 124/79 | HR 91 | Temp 97.2°F | Wt 218.0 lb

## 2018-06-04 DIAGNOSIS — K5720 Diverticulitis of large intestine with perforation and abscess without bleeding: Secondary | ICD-10-CM | POA: Diagnosis not present

## 2018-06-04 LAB — AEROBIC/ANAEROBIC CULTURE (SURGICAL/DEEP WOUND): Special Requests: NORMAL

## 2018-06-04 LAB — AEROBIC/ANAEROBIC CULTURE W GRAM STAIN (SURGICAL/DEEP WOUND)

## 2018-06-04 MED ORDER — OXYCODONE HCL 5 MG PO TABS: 5.0000 mg | ORAL_TABLET | ORAL | 0 refills | Status: DC | PRN

## 2018-06-04 NOTE — Progress Notes (Signed)
06/05/2018  History of Present Illness: Linda Bradford is a 62 y.o. female who was recently hospitalized with acute diverticulitis with abscess formation, which required drainage while in hospital by IR.  She was discharged on Friday 11/1 in good condition and called this morning reporting worsening pain around her drain.  Her drain culture resulted in E coli, sensitive to Augmentin which was her discharge antibiotic.  Patient reports that she had bad diarrhea on Saturday 11/2 and this caused some pain that radiated towards her back.  She had taken Miralax to help with her bowel movement.  On Sunday her pain was better as well as her stool.  She also reports that flushing her drain is very tender.  She has been using 5 ml of saline twice daily.  She denies any fevers, chills, other areas of abdominal pain.  Past Medical History: Past Medical History:  Diagnosis Date  . Anxiety   . COPD (chronic obstructive pulmonary disease) ([place])   . Depression   . Diabetes mellitus without complication ([place])   . Fatigue   . Headache   . Hypertension   . Thyroid disease      Past Surgical History: Past Surgical History:  Procedure Laterality Date  . APPENDECTOMY    . TUBAL LIGATION      Home Medications: Prior to Admission medications   Medication Sig Start Date End Date Taking? Authorizing Provider  acetaminophen (TYLENOL) 500 MG tablet Take 1,000 mg by mouth every 6 (six) hours as needed for mild pain or moderate pain.    Yes [provider]  albuterol (PROVENTIL HFA;VENTOLIN HFA) 108 (90 Base) MCG/ACT inhaler Inhale 1 puff into the lungs every 4 (four) hours as needed. 08/04/17  Yes Mar Daring, PA-C  amoxicillin-clavulanate (AUGMENTIN) 875-125 MG tablet Take 1 tablet by mouth every 12 (twelve) hours for 14 days. 06/01/18 06/15/18 Yes Tylene Fantasia, PA-C  benzonatate (TESSALON) 200 MG capsule Take 1 capsule (200 mg total) by mouth 3 (three) times daily as needed for cough.  04/17/18  Yes Burnette, Jennifer M, PA-C  BREO ELLIPTA 200-25 MCG/INH AEPB INHALE 1 PUFF BY MOUTH ONCE DAILY Patient taking differently: Inhale 1 puff into the lungs daily.  01/25/18  Yes Burnette, Clearnce Sorrel, PA-C  clonazePAM (KLONOPIN) 0.5 MG tablet TAKE 1/2 TABLET BY MOUTH TWICE DAILY AS NEEDED 02/23/18  Yes Fenton Malling M, PA-C  Cyanocobalamin (RA VITAMIN B-12 TR) 1000 MCG TBCR Take 1,000 mcg by mouth daily.    Yes [provider]  diclofenac sodium (VOLTAREN) 1 % GEL Apply 4 g topically 4 (four) times daily as needed. 03/01/18  Yes Mar Daring, PA-C  DULoxetine (CYMBALTA) 60 MG capsule TAKE 1 CAPSULE BY MOUTH TWICE DAILY 02/23/18  Yes Mar Daring, PA-C  ezetimibe (ZETIA) 10 MG tablet TAKE 1 TABLET BY MOUTH ONCE DAILY 12/01/17  Yes Fenton Malling M, PA-C  gabapentin (NEURONTIN) 300 MG capsule Take 1200mg  (4 capsules) at bedtime, take 300mg  (1 capsule) in the morning 03/28/18  Yes Burnette, Anderson Malta M, PA-C  gabapentin (NEURONTIN) 300 MG capsule Take 300-1,200 mg by mouth See admin instructions. Take 1 capsule (300MG ) by mouth every morning and 4 capsules (1200MG ) by mouth every night at bedtime   Yes [provider]  glucose blood (CONTOUR NEXT TEST) test strip To check blood sugar once daily 06/09/17  Yes Burnette, Jennifer M, PA-C  ipratropium-albuterol (DUONEB) 0.5-2.5 (3) MG/3ML SOLN Take 3 mLs by nebulization every 6 (six) hours as needed. 11/04/15  Yes  Fenton Malling M, PA-C  levothyroxine (SYNTHROID, LEVOTHROID) 75 MCG tablet TAKE 1 TABLET BY MOUTH ONCE DAILY ON AN EMPTY STOMACH. WAIT 30 MINUTES BEFORE TAKING OTHER MEDS. Patient taking differently: Take 75 mcg by mouth daily.  02/23/18  Yes Mar Daring, PA-C  losartan (COZAAR) 50 MG tablet TAKE 1 TABLET BY MOUTH ONCE DAILY 10/26/17  Yes Mar Daring, PA-C  meclizine (ANTIVERT) 25 MG tablet Take 1 tablet (25 mg total) by mouth 3 (three) times daily as needed for dizziness. 05/15/18  Yes  Burnette, Clearnce Sorrel, PA-C  meloxicam (MOBIC) 15 MG tablet TAKE 1 TABLET BY MOUTH ONCE DAILY. 03/28/18  Yes Fenton Malling M, PA-C  metFORMIN (GLUCOPHAGE-XR) 500 MG 24 hr tablet TAKE 2 TABLETS BY MOUTH ONCE DAILY WITH SUPPER 03/01/18  Yes Burnette, Jennifer M, PA-C  nicotine (NICODERM CQ - DOSED IN MG/24 HOURS) 21 mg/24hr patch Place 1 patch (21 mg total) onto the skin daily. 06/02/18  Yes Edison Simon R, PA-C  oxyCODONE (OXY IR/ROXICODONE) 5 MG immediate release tablet Take 1 tablet (5 mg total) by mouth every 4 (four) hours as needed for severe pain or breakthrough pain. 06/01/18  Yes Edison Simon R, PA-C  polyethylene glycol (MIRALAX / GLYCOLAX) packet Take 17 g by mouth daily. 06/02/18  Yes Tylene Fantasia, PA-C  predniSONE (DELTASONE) 10 MG tablet Take 6 tabs PO on day 1&2, 5 tabs PO on day 3&4, 4 tabs PO on day 5&6, 3 tabs PO on day 7&8, 2 tabs PO on day 9&10, 1 tab PO on day 11&12. 05/15/18  Yes Burnette, Clearnce Sorrel, PA-C  pyridOXINE (VITAMIN B-6) 100 MG tablet Take 100 mg by mouth daily.   Yes [provider]  sodium chloride flush (NS) 0.9 % SOLN 5 mLs by Intracatheter route every 8 (eight) hours. 06/01/18  Yes Edison Simon R, PA-C  THEO-24 300 MG 24 hr capsule TAKE 1 CAPSULE BY MOUTH ONCE DAILY 12/01/17  Yes Burnette, Clearnce Sorrel, PA-C  tiZANidine (ZANAFLEX) 4 MG capsule TAKE 1 CAPSULE BY MOUTH 3 TIMES DAILY Patient taking differently: Take 4 mg by mouth at bedtime as needed for muscle spasms.  02/19/18  Yes Mar Daring, PA-C  Vitamin D, Ergocalciferol, (DRISDOL) 50000 units CAPS capsule TAKE 1 CAPSULE BY MOUTH EVERY 7 DAYS Patient taking differently: Take 50,000 Units by mouth every Tuesday.  12/01/17  Yes Mar Daring, PA-C    Allergies: Allergies  Allergen Reactions  . Dexilant [Dexlansoprazole] Nausea Only and Other (See Comments)    Dizziness    Review of Systems: Review of Systems  Constitutional: Negative for chills and fever.  Respiratory:  Negative for shortness of breath.   Cardiovascular: Negative for chest pain.  Gastrointestinal: Positive for abdominal pain and diarrhea. Negative for constipation, nausea and vomiting.    Physical Exam BP 124/79   Pulse 91   Temp (!) 97.2 F (36.2 C) (Skin)   Wt 218 lb (98.9 kg)   SpO2 91% Comment: room air  BMI 32.19 kg/m  CONSTITUTIONAL: No acute distress HEENT:  Normocephalic, atraumatic, extraocular motion intact. RESPIRATORY:  Lungs are clear, and breath sounds are equal bilaterally. Normal respiratory effort without pathologic use of accessory muscles. CARDIOVASCULAR: Heart is regular without murmurs, gallops, or rubs. GI: The abdomen is soft, obese, nondistended, with tenderness to palpation in the left lower quadrant at the drain insertion site.  There was no significant pain in other areas.  Drain in place, secured with clip dressing, with purulent fluid in tubing.  NEUROLOGIC:  Motor and sensation is grossly normal.  Cranial nerves are grossly intact. PSYCH:  Alert and oriented to person, place and time. Affect is normal.  Labs/Imaging: None since discharge.  Assessment and Plan: This is a 62 y.o. female with acute diverticulitis with small abscess, requiring IR drainage.  --Discussed with patient that her pain may be multifactorial.  One possible issue could be that because the abscess is small, about 3 cm size, the amount of volume used to flush may be causing her pain.  Recommended that she use only half the volume to flush her drain.  Also, her pain could be worse because of the diarrhea she had with the MiraLax.  Recommended that she stay off the MiraLax for now unless she's constipated.  The pain itself could be related to the drain, as she is tender at the insertion site. --She will follow these recommendations and come back in a week for another check.  She will continue her antibiotic course until completed.  The drain still has purulent fluid so will stay in place for  now.   Face-to-face time spent with the patient and care providers was 15 minutes, with more than 50% of the time spent counseling, educating, and coordinating care of the patient.     Melvyn Neth, [facility]

## 2018-06-04 NOTE — Telephone Encounter (Signed)
Flagged on EMMI report for not knowing who to call about changes in condition.  Called and spoke with patient, who confirms she knows to reach out to Dr. Mont Dutton office or her PCP for needs.  Reports doing well and that she had a pleasant hospital stay, however provided feedback that she had to sit in the ER for 5-6 hours without treatment with severe abdominal pain which upset her. She mentioned we may need to look into our triage process for abdominal pain.  I thanked her for her feedback and mentioned I would pass on.  No other questions or concerns at this time.  I thanked her for her time and informed her she would receive one more automated call checking in over the next few days.

## 2018-06-04 NOTE — Patient Instructions (Addendum)
Please flush 2.5 cc on your drain daily.  The drain will not come out until the drainage is clear in color. This usually takes about one to two weeks.  We will give you a refill on your pain medication.  Stop taking Miralax until you feel that you are constipated.  Please finish all of your antibiotics.   Low-Fiber Diet Fiber is found in fruits, vegetables, and whole grains. A low-fiber diet restricts fibrous foods that are not digested in the small intestine. A diet containing about 10-15 grams of fiber per day is considered low fiber. Low-fiber diets may be used to:  Promote healing and rest the bowel during intestinal flare-ups.  Prevent blockage of a partially obstructed or narrowed gastrointestinal tract.  Reduce fecal weight and volume.  Slow the movement of feces.  You may be on a low-fiber diet as a transitional diet following surgery, after an injury (trauma), or because of a short (acute) or lifelong (chronic) illness. Your health care provider will determine the length of time you need to stay on this diet. What do I need to know about a low-fiber diet? Always check the fiber content on the packaging's Nutrition Facts label, especially on foods from the grains list. Ask your dietitian if you have questions about specific foods that are related to your condition, especially if the food is not listed below. In general, a low-fiber food will have less than 2 g of fiber. What foods can I eat? Grains All breads and crackers made with white flour. Sweet rolls, doughnuts, waffles, pancakes, [place] toast, bagels. Pretzels, Melba toast, zwieback. Well-cooked cereals, such as cornmeal, farina, or cream cereals. Dry cereals that do not contain whole grains, fruit, or nuts, such as refined corn, wheat, rice, and oat cereals. Potatoes prepared any way without skins, plain pastas and noodles, refined white rice. Use white flour for baking and making sauces. Use allowed list of grains for  casseroles, dumplings, and puddings. Vegetables Strained tomato and vegetable juices. Fresh lettuce, cucumber, spinach. Well-cooked (no skin or pulp) or canned vegetables, such as asparagus, bean sprouts, beets, carrots, green beans, mushrooms, potatoes, pumpkin, spinach, yellow squash, tomato sauce/puree, turnips, yams, and zucchini. Keep servings limited to  cup. Fruits All fruit juices except prune juice. Cooked or canned fruits without skin and seeds, such as applesauce, apricots, cherries, fruit cocktail, grapefruit, grapes, mandarin oranges, melons, peaches, pears, pineapple, and plums. Fresh fruits without skin, such as apricots, avocados, bananas, melons, pineapple, nectarines, and peaches. Keep servings limited to  cup or 1 piece. Meat and Other Protein Sources Ground or well-cooked tender beef, ham, veal, lamb, pork, or poultry. Eggs, plain cheese. Fish, oysters, shrimp, lobster, and other seafood. Liver, organ meats. Smooth nut butters. Dairy All milk products and alternative dairy substitutes, such as soy, rice, almond, and coconut, not containing added whole nuts, seeds, or added fruit. Beverages Decaf coffee, fruit, and vegetable juices or smoothies (small amounts, with no pulp or skins, and with fruits from allowed list), sports drinks, herbal tea. Condiments Ketchup, mustard, vinegar, cream sauce, cheese sauce, cocoa powder. Spices in moderation, such as allspice, basil, bay leaves, celery powder or leaves, cinnamon, cumin powder, curry powder, ginger, mace, marjoram, onion or garlic powder, oregano, paprika, parsley flakes, ground pepper, rosemary, sage, savory, tarragon, thyme, and turmeric. Sweets and Desserts Plain cakes and cookies, pie made with allowed fruit, pudding, custard, cream pie. Gelatin, fruit, ice, sherbet, frozen ice pops. Ice cream, ice milk without nuts. Plain hard candy, honey, jelly,  molasses, syrup, sugar, chocolate syrup, gumdrops, marshmallows. Limit overall  sugar intake. Fats and Oil Margarine, butter, cream, mayonnaise, salad oils, plain salad dressings made from allowed foods. Choose healthy fats such as olive oil, canola oil, and omega-3 fatty acids (such as found in salmon or tuna) when possible. Other Bouillon, broth, or cream soups made from allowed foods. Any strained soup. Casseroles or mixed dishes made with allowed foods. The items listed above may not be a complete list of recommended foods or beverages. Contact your dietitian for more options. What foods are not recommended? Grains All whole wheat and whole grain breads and crackers. Multigrains, rye, bran seeds, nuts, or coconut. Cereals containing whole grains, multigrains, bran, coconut, nuts, raisins. Cooked or dry oatmeal, steel-cut oats. Coarse wheat cereals, granola. Cereals advertised as high fiber. Potato skins. Whole grain pasta, wild or brown rice. Popcorn. Coconut flour. Bran, buckwheat, corn bread, multigrains, rye, wheat germ. Vegetables Fresh, cooked or canned vegetables, such as artichokes, asparagus, beet greens, broccoli, Brussels sprouts, cabbage, celery, cauliflower, corn, eggplant, kale, legumes or beans, okra, peas, and tomatoes. Avoid large servings of any vegetables, especially raw vegetables. Fruits Fresh fruits, such as apples with or without skin, berries, cherries, figs, grapes, grapefruit, guavas, kiwis, mangoes, oranges, papayas, pears, persimmons, pineapple, and pomegranate. Prune juice and juices with pulp, stewed or dried prunes. Dried fruits, dates, raisins. Fruit seeds or skins. Avoid large servings of all fresh fruits. Meats and Other Protein Sources Tough, fibrous meats with gristle. Chunky nut butter. Cheese made with seeds, nuts, or other foods not recommended. Nuts, seeds, legumes (beans, including baked beans), dried peas, beans, lentils. Dairy Yogurt or cheese that contains nuts, seeds, or added fruit. Beverages Fruit juices with high pulp, prune  juice. Caffeinated coffee and teas. Condiments Coconut, maple syrup, pickles, olives. Sweets and Desserts Desserts, cookies, or candies that contain nuts or coconut, chunky peanut butter, dried fruits. Jams, preserves with seeds, marmalade. Large amounts of sugar and sweets. Any other dessert made with fruits from the not recommended list. Other Soups made from vegetables that are not recommended or that contain other foods not recommended. The items listed above may not be a complete list of foods and beverages to avoid. Contact your dietitian for more information. This information is not intended to replace advice given to you by your health care provider. Make sure you discuss any questions you have with your health care provider. Document Released: 01/07/2002 Document Revised: 12/24/2015 Document Reviewed: 06/10/2013 Elsevier Interactive Patient Education  2017 [employer]    Diverticulitis Diverticulitis is when small pockets in your large intestine (colon) get infected or swollen. This causes stomach pain and watery poop (diarrhea). These pouches are called diverticula. They form in people who have a condition called diverticulosis. Follow these instructions at home: Medicines  Take over-the-counter and prescription medicines only as told by your doctor. These include: ? Antibiotics. ? Pain medicines. ? Fiber pills. ? Probiotics. ? Stool softeners.  Do not drive or use heavy machinery while taking prescription pain medicine.  If you were prescribed an antibiotic, take it as told. Do not stop taking it even if you feel better. General instructions  Follow a diet as told by your doctor.  When you feel better, your doctor may tell you to change your diet. You may need to eat a lot of fiber. Fiber makes it easier to poop (have bowel movements). Healthy foods with fiber include: ? Berries. ? Beans. ? Lentils. ? Green vegetables.  Exercise 3  or more times a week. Aim for 30  minutes each time. Exercise enough to sweat and make your heart beat faster.  Keep all follow-up visits as told. This is important. You may need to have an exam of the large intestine. This is called a colonoscopy. Contact a doctor if:  Your pain does not get better.  You have a hard time eating or drinking.  You are not pooping like normal. Get help right away if:  Your pain gets worse.  Your problems do not get better.  Your problems get worse very fast.  You have a fever.  You throw up (vomit) more than one time.  You have poop that is: ? Bloody. ? Black. ? Tarry. Summary  Diverticulitis is when small pockets in your large intestine (colon) get infected or swollen.  Take medicines only as told by your doctor.  Follow a diet as told by your doctor. This information is not intended to replace advice given to you by your health care provider. Make sure you discuss any questions you have with your health care provider. Document Released: 01/04/2008 Document Revised: 08/04/2016 Document Reviewed: 08/04/2016 Elsevier Interactive Patient Education  2017 [employer].

## 2018-06-05 ENCOUNTER — Inpatient Hospital Stay: Payer: BLUE CROSS/BLUE SHIELD | Admitting: Surgery

## 2018-06-08 ENCOUNTER — Telehealth: Payer: Self-pay | Admitting: Licensed Clinical Social Worker

## 2018-06-08 NOTE — Telephone Encounter (Signed)
CSW followed up with patient for an EMMI call response. Patient stated she was feeling sad for that one day because she had to put her kitty cat to sleep at the vet. Patient confirms she is doing good and is having no thoughts of sadness or hopelessness. Shela Leff MSW,LCSW [phone]

## 2018-06-10 ENCOUNTER — Other Ambulatory Visit: Payer: Self-pay | Admitting: Physician Assistant

## 2018-06-10 DIAGNOSIS — J449 Chronic obstructive pulmonary disease, unspecified: Secondary | ICD-10-CM

## 2018-06-12 ENCOUNTER — Encounter: Payer: Self-pay | Admitting: Surgery

## 2018-06-12 ENCOUNTER — Ambulatory Visit (INDEPENDENT_AMBULATORY_CARE_PROVIDER_SITE_OTHER): Payer: BLUE CROSS/BLUE SHIELD | Admitting: Surgery

## 2018-06-12 ENCOUNTER — Other Ambulatory Visit: Payer: Self-pay

## 2018-06-12 ENCOUNTER — Ambulatory Visit: Payer: BLUE CROSS/BLUE SHIELD | Admitting: Surgery

## 2018-06-12 VITALS — BP 164/74 | HR 94 | Temp 96.8°F | Ht 69.0 in | Wt 216.0 lb

## 2018-06-12 DIAGNOSIS — K5720 Diverticulitis of large intestine with perforation and abscess without bleeding: Secondary | ICD-10-CM

## 2018-06-12 NOTE — Progress Notes (Signed)
06/12/2018  History of Present Illness: Linda Bradford is a 62 y.o. female recently admitted with diverticulitis with small abscess, s/p IR drainage.  She was seen last week with worse abdominal pain, but today she reports that she's been doing much better, with no significant pain while flushing the drain, and with low volume output from the drain.  Denies any fevers, chills, nausea, or vomiting.  Reports having bowel movements and tolerating a diet.  Past Medical History: Past Medical History:  Diagnosis Date  . Anxiety   . COPD (chronic obstructive pulmonary disease) ([place])   . Depression   . Diabetes mellitus without complication ([place])   . Fatigue   . Headache   . Hypertension   . Thyroid disease      Past Surgical History: Past Surgical History:  Procedure Laterality Date  . APPENDECTOMY    . TUBAL LIGATION      Home Medications: Prior to Admission medications   Medication Sig Start Date End Date Taking? Authorizing Provider  acetaminophen (TYLENOL) 500 MG tablet Take 1,000 mg by mouth every 6 (six) hours as needed for mild pain or moderate pain.    Yes [provider]  albuterol (PROVENTIL HFA;VENTOLIN HFA) 108 (90 Base) MCG/ACT inhaler Inhale 1 puff into the lungs every 4 (four) hours as needed. 08/04/17  Yes Mar Daring, PA-C  amoxicillin-clavulanate (AUGMENTIN) 875-125 MG tablet Take 1 tablet by mouth every 12 (twelve) hours for 14 days. 06/01/18 06/15/18 Yes Tylene Fantasia, PA-C  benzonatate (TESSALON) 200 MG capsule Take 1 capsule (200 mg total) by mouth 3 (three) times daily as needed for cough. 04/17/18  Yes Burnette, Jennifer M, PA-C  BREO ELLIPTA 200-25 MCG/INH AEPB INHALE 1 PUFF BY MOUTH ONCE DAILY Patient taking differently: Inhale 1 puff into the lungs daily.  01/25/18  Yes Burnette, Clearnce Sorrel, PA-C  clonazePAM (KLONOPIN) 0.5 MG tablet TAKE 1/2 TABLET BY MOUTH TWICE DAILY AS NEEDED 02/23/18  Yes Fenton Malling M, PA-C  Cyanocobalamin (RA VITAMIN  B-12 TR) 1000 MCG TBCR Take 1,000 mcg by mouth daily.    Yes [provider]  diclofenac sodium (VOLTAREN) 1 % GEL Apply 4 g topically 4 (four) times daily as needed. 03/01/18  Yes Mar Daring, PA-C  DULoxetine (CYMBALTA) 60 MG capsule TAKE 1 CAPSULE BY MOUTH TWICE DAILY 02/23/18  Yes Fenton Malling M, PA-C  DULoxetine (CYMBALTA) 60 MG capsule duloxetine 60 mg capsule,delayed release   Yes [provider]  ezetimibe (ZETIA) 10 MG tablet TAKE 1 TABLET BY MOUTH ONCE DAILY 12/01/17  Yes Fenton Malling M, PA-C  gabapentin (NEURONTIN) 300 MG capsule Take 1200mg  (4 capsules) at bedtime, take 300mg  (1 capsule) in the morning 03/28/18  Yes Burnette, Anderson [place] M, PA-C  gabapentin (NEURONTIN) 300 MG capsule Take 300-1,200 mg by mouth See admin instructions. Take 1 capsule (300MG ) by mouth every morning and 4 capsules (1200MG ) by mouth every night at bedtime   Yes [provider]  glucose blood (CONTOUR NEXT TEST) test strip To check blood sugar once daily 06/09/17  Yes Burnette, Jennifer M, PA-C  ipratropium-albuterol (DUONEB) 0.5-2.5 (3) MG/3ML SOLN Take 3 mLs by nebulization every 6 (six) hours as needed. 11/04/15  Yes Burnette, Clearnce Sorrel, PA-C  levothyroxine (SYNTHROID, LEVOTHROID) 75 MCG tablet TAKE 1 TABLET BY MOUTH ONCE DAILY ON AN EMPTY STOMACH. WAIT 30 MINUTES BEFORE TAKING OTHER MEDS. Patient taking differently: Take 75 mcg by mouth daily.  02/23/18  Yes Mar Daring, PA-C  losartan (COZAAR) 50 MG  tablet TAKE 1 TABLET BY MOUTH ONCE DAILY 10/26/17  Yes Mar Daring, PA-C  meclizine (ANTIVERT) 25 MG tablet Take 1 tablet (25 mg total) by mouth 3 (three) times daily as needed for dizziness. 05/15/18  Yes Burnette, Clearnce Sorrel, PA-C  meloxicam (MOBIC) 15 MG tablet TAKE 1 TABLET BY MOUTH ONCE DAILY. 03/28/18  Yes Fenton Malling M, PA-C  metFORMIN (GLUCOPHAGE-XR) 500 MG 24 hr tablet TAKE 2 TABLETS BY MOUTH ONCE DAILY WITH SUPPER 03/01/18  Yes Burnette, Jennifer  M, PA-C  nicotine (NICODERM CQ - DOSED IN MG/24 HOURS) 21 mg/24hr patch Place 1 patch (21 mg total) onto the skin daily. 06/02/18  Yes Edison Simon R, PA-C  oxyCODONE (OXY IR/ROXICODONE) 5 MG immediate release tablet Take 1 tablet (5 mg total) by mouth every 4 (four) hours as needed for severe pain or breakthrough pain. 06/01/18  Yes Tylene Fantasia, PA-C  oxyCODONE (ROXICODONE) 5 MG immediate release tablet Take 1 tablet (5 mg total) by mouth every 4 (four) hours as needed for severe pain. 06/04/18  Yes Hellena Pridgen, MD  polyethylene glycol (MIRALAX / GLYCOLAX) packet Take 17 g by mouth daily. 06/02/18  Yes Tylene Fantasia, PA-C  predniSONE (DELTASONE) 10 MG tablet Take 6 tabs PO on day 1&2, 5 tabs PO on day 3&4, 4 tabs PO on day 5&6, 3 tabs PO on day 7&8, 2 tabs PO on day 9&10, 1 tab PO on day 11&12. 05/15/18  Yes Burnette, Clearnce Sorrel, PA-C  pyridOXINE (VITAMIN B-6) 100 MG tablet Take 100 mg by mouth daily.   Yes [provider]  sodium chloride flush (NS) 0.9 % SOLN 5 mLs by Intracatheter route every 8 (eight) hours. 06/01/18  Yes Edison Simon R, PA-C  THEO-24 300 MG 24 hr capsule TAKE 1 CAPSULE BY MOUTH ONCE DAILY 06/11/18  Yes Fenton Malling M, PA-C  tiZANidine (ZANAFLEX) 4 MG capsule TAKE 1 CAPSULE BY MOUTH 3 TIMES DAILY Patient taking differently: Take 4 mg by mouth at bedtime as needed for muscle spasms.  02/19/18  Yes Mar Daring, PA-C  Vitamin D, Ergocalciferol, (DRISDOL) 50000 units CAPS capsule TAKE 1 CAPSULE BY MOUTH EVERY 7 DAYS Patient taking differently: Take 50,000 Units by mouth every Tuesday.  12/01/17  Yes Mar Daring, PA-C    Allergies: Allergies  Allergen Reactions  . Dexilant [Dexlansoprazole] Nausea Only and Other (See Comments)    Dizziness    Review of Systems: Review of Systems  Constitutional: Negative for chills and fever.  Gastrointestinal: Negative for abdominal pain, diarrhea, nausea and vomiting.    Physical Exam BP (!)  164/74   Pulse 94   Temp (!) 96.8 F (36 C) (Oral)   Ht 5\' 9"  (1.753 m)   Wt 216 lb (98 kg)   BMI 31.90 kg/m  CONSTITUTIONAL: No acute distress HEENT:  Normocephalic, atraumatic, extraocular motion intact. RESPIRATORY:  Lungs are clear, and breath sounds are equal bilaterally. Normal respiratory effort without pathologic use of accessory muscles. CARDIOVASCULAR: Heart is regular without murmurs, gallops, or rubs. GI: The abdomen is soft, obese, nondistended, nontender to palpation except at the drain insertion site itself.  Drain in place with seropurulent fluid, low volume.  Easily flushable. NEUROLOGIC:  Motor and sensation is grossly normal.  Cranial nerves are grossly intact. PSYCH:  Alert and oriented to person, place and time. Affect is normal.  Labs/Imaging: None  Assessment and Plan: This is a 62 y.o. female with recent episode of diverticulitis with abscess.  --Drain removed  at bedside without complications. --Discussed with patient that she would need a colonoscopy in 6-8 weeks.  Discussed with her that given the complicated diverticulitis episode, we could also discuss the option for surgical management in the form of sigmoidectomy.  At this point the patient does not appear interested in any surgical intervention and would rather wait.  I suggested that we meet after her colonoscopy is done so that we can discuss the results and talk again about any possible surgical options.  She is in agreement and will follow up early next year after her colonoscopy.  Face-to-face time spent with the patient and care providers was 15 minutes, with more than 50% of the time spent counseling, educating, and coordinating care of the patient.     Melvyn Neth, [facility]

## 2018-06-12 NOTE — Patient Instructions (Addendum)
We have removed your drain  today. Please keep a dressing over the drain site until healed completely.  We will send the referral  to Gastroenterologist for the consult to discuss Colonoscopy. Someone from their office should contact you within 5 days to schedule an appointment.   Please call our office after you have the Colonoscopy to discuss possible surgery.  Please call our office with any questions or concerns.

## 2018-06-13 ENCOUNTER — Ambulatory Visit (INDEPENDENT_AMBULATORY_CARE_PROVIDER_SITE_OTHER): Payer: BLUE CROSS/BLUE SHIELD | Admitting: Physician Assistant

## 2018-06-13 ENCOUNTER — Encounter: Payer: Self-pay | Admitting: Physician Assistant

## 2018-06-13 VITALS — BP 135/84 | HR 96 | Temp 97.8°F | Resp 16 | Wt 215.4 lb

## 2018-06-13 DIAGNOSIS — K5720 Diverticulitis of large intestine with perforation and abscess without bleeding: Secondary | ICD-10-CM | POA: Diagnosis not present

## 2018-06-13 DIAGNOSIS — E119 Type 2 diabetes mellitus without complications: Secondary | ICD-10-CM

## 2018-06-13 LAB — POCT GLYCOSYLATED HEMOGLOBIN (HGB A1C)
Est. average glucose Bld gHb Est-mCnc: 160
Hemoglobin A1C: 7.2 % — AB (ref 4.0–5.6)

## 2018-06-13 NOTE — Progress Notes (Signed)
Patient: Linda Bradford Female    DOB: 22-Mar-1956   62 y.o.   MRN: [id number] Visit Date: 06/13/2018  Today's Provider: Mar Daring, PA-C   Chief Complaint  Patient presents with  . Hospitalization Follow-up  . Diabetes   Subjective:    HPI  Follow up Hospitalization  Patient was admitted to [facility] on 05/27/18 and discharged on 06/01/2018. She was treated for acute Diverticulitis and Percutaneous abscess. Treatment for this included s/p IR Drainage and Augmentin 875-125mg . Telephone follow up was done on n/a. She reports excellent compliance with treatment. She reports this condition is Improved. Pt followed up with General Surgery yesterday. Drain removed.  Patient need a Colonoscopy in 6-8 weeks.She is waiting on the office to call her. ------------------------------------------------------------------------------------   Diabetes Mellitus Type II, Follow-up:   Lab Results  Component Value Date   HGBA1C 7.2 (A) 03/01/2018   HGBA1C 7.2 11/27/2017   HGBA1C 7.6 10/13/2017    Last seen for diabetes 3 months ago.  Management since then includes continue Metformin. Patient having worsening Neuropathy sensation. May consider increasing Gabapentin and adding a daytime dose if no improvement at this visit. She reports excellent compliance with treatment. She is not having side effects.  Current symptoms include none and have been stable. Home blood sugar records: 180-120's  Episodes of hypoglycemia? no   Current Insulin Regimen: none Most Recent Eye Exam: she is due next month. Weight trend: stable Current diet: well balanced Current exercise: no regular exercise  Pertinent Labs:    Component Value Date/Time   CHOL 265 (H) 05/19/2017 0823   TRIG 204 (H) 05/19/2017 0823   HDL 62 05/19/2017 0823   LDLCALC 167 (H) 05/19/2017 0823   CREATININE 0.56 05/28/2018 0452   CREATININE 0.58 05/19/2017 0823    Wt Readings from Last 3 Encounters:  06/13/18 215 lb  6.4 oz (97.7 kg)  06/12/18 216 lb (98 kg)  06/04/18 218 lb (98.9 kg)   ------------------------------------------------------------------------     Allergies  Allergen Reactions  . Dexilant [Dexlansoprazole] Nausea Only and Other (See Comments)    Dizziness     Current Outpatient Medications:  .  acetaminophen (TYLENOL) 500 MG tablet, Take 1,000 mg by mouth every 6 (six) hours as needed for mild pain or moderate pain. , Disp: , Rfl:  .  albuterol (PROVENTIL HFA;VENTOLIN HFA) 108 (90 Base) MCG/ACT inhaler, Inhale 1 puff into the lungs every 4 (four) hours as needed., Disp: 1 Inhaler, Rfl: 11 .  amoxicillin-clavulanate (AUGMENTIN) 875-125 MG tablet, Take 1 tablet by mouth every 12 (twelve) hours for 14 days., Disp: 28 tablet, Rfl: 0 .  BREO ELLIPTA 200-25 MCG/INH AEPB, INHALE 1 PUFF BY MOUTH ONCE DAILY (Patient taking differently: Inhale 1 puff into the lungs daily. ), Disp: 60 each, Rfl: 5 .  clonazePAM (KLONOPIN) 0.5 MG tablet, TAKE 1/2 TABLET BY MOUTH TWICE DAILY AS NEEDED, Disp: 180 tablet, Rfl: 1 .  Cyanocobalamin (RA VITAMIN B-12 TR) 1000 MCG TBCR, Take 1,000 mcg by mouth daily. , Disp: , Rfl:  .  diclofenac sodium (VOLTAREN) 1 % GEL, Apply 4 g topically 4 (four) times daily as needed., Disp: 100 g, Rfl: 1 .  DULoxetine (CYMBALTA) 60 MG capsule, TAKE 1 CAPSULE BY MOUTH TWICE DAILY, Disp: 180 capsule, Rfl: 1 .  ezetimibe (ZETIA) 10 MG tablet, TAKE 1 TABLET BY MOUTH ONCE DAILY, Disp: 90 tablet, Rfl: 1 .  gabapentin (NEURONTIN) 300 MG capsule, Take 300-1,200 mg by mouth See admin instructions.  Take 1 capsule (300MG ) by mouth every morning and 4 capsules (1200MG ) by mouth every night at bedtime, Disp: , Rfl:  .  glucose blood (CONTOUR NEXT TEST) test strip, To check blood sugar once daily, Disp: 100 each, Rfl: 12 .  ipratropium-albuterol (DUONEB) 0.5-2.5 (3) MG/3ML SOLN, Take 3 mLs by nebulization every 6 (six) hours as needed., Disp: 360 mL, Rfl: 11 .  levothyroxine (SYNTHROID,  LEVOTHROID) 75 MCG tablet, TAKE 1 TABLET BY MOUTH ONCE DAILY ON AN EMPTY STOMACH. WAIT 30 MINUTES BEFORE TAKING OTHER MEDS. (Patient taking differently: Take 75 mcg by mouth daily. ), Disp: 90 tablet, Rfl: 1 .  losartan (COZAAR) 50 MG tablet, TAKE 1 TABLET BY MOUTH ONCE DAILY, Disp: 90 tablet, Rfl: 2 .  meloxicam (MOBIC) 15 MG tablet, TAKE 1 TABLET BY MOUTH ONCE DAILY., Disp: 90 tablet, Rfl: 1 .  metFORMIN (GLUCOPHAGE-XR) 500 MG 24 hr tablet, TAKE 2 TABLETS BY MOUTH ONCE DAILY WITH SUPPER, Disp: 180 tablet, Rfl: 1 .  pyridOXINE (VITAMIN B-6) 100 MG tablet, Take 100 mg by mouth daily., Disp: , Rfl:  .  THEO-24 300 MG 24 hr capsule, TAKE 1 CAPSULE BY MOUTH ONCE DAILY, Disp: 90 capsule, Rfl: 1 .  tiZANidine (ZANAFLEX) 4 MG capsule, TAKE 1 CAPSULE BY MOUTH 3 TIMES DAILY (Patient taking differently: Take 4 mg by mouth at bedtime as needed for muscle spasms. ), Disp: 90 capsule, Rfl: 6 .  Vitamin D, Ergocalciferol, (DRISDOL) 50000 units CAPS capsule, TAKE 1 CAPSULE BY MOUTH EVERY 7 DAYS (Patient taking differently: Take 50,000 Units by mouth every Tuesday. ), Disp: 12 capsule, Rfl: 3 .  benzonatate (TESSALON) 200 MG capsule, Take 1 capsule (200 mg total) by mouth 3 (three) times daily as needed for cough. (Patient not taking: Reported on 06/13/2018), Disp: 90 capsule, Rfl: 3 .  meclizine (ANTIVERT) 25 MG tablet, Take 1 tablet (25 mg total) by mouth 3 (three) times daily as needed for dizziness. (Patient not taking: Reported on 06/13/2018), Disp: 30 tablet, Rfl: 0 .  nicotine (NICODERM CQ - DOSED IN MG/24 HOURS) 21 mg/24hr patch, Place 1 patch (21 mg total) onto the skin daily. (Patient not taking: Reported on 06/13/2018), Disp: 28 patch, Rfl: 0 .  oxyCODONE (OXY IR/ROXICODONE) 5 MG immediate release tablet, Take 1 tablet (5 mg total) by mouth every 4 (four) hours as needed for severe pain or breakthrough pain. (Patient not taking: Reported on 06/13/2018), Disp: 20 tablet, Rfl: 0 .  oxyCODONE (ROXICODONE) 5 MG  immediate release tablet, Take 1 tablet (5 mg total) by mouth every 4 (four) hours as needed for severe pain. (Patient not taking: Reported on 06/13/2018), Disp: 30 tablet, Rfl: 0 .  polyethylene glycol (MIRALAX / GLYCOLAX) packet, Take 17 g by mouth daily. (Patient not taking: Reported on 06/13/2018), Disp: 14 each, Rfl: 1 .  predniSONE (DELTASONE) 10 MG tablet, Take 6 tabs PO on day 1&2, 5 tabs PO on day 3&4, 4 tabs PO on day 5&6, 3 tabs PO on day 7&8, 2 tabs PO on day 9&10, 1 tab PO on day 11&12. (Patient not taking: Reported on 06/13/2018), Disp: 42 tablet, Rfl: 0  Review of Systems  Constitutional: Negative.   Respiratory: Negative.   Cardiovascular: Negative.   Gastrointestinal: Negative.   Endocrine: Negative.   Neurological: Negative.     Social History   Tobacco Use  . Smoking status: Current Every Day Smoker    Packs/day: 1.00    Years: 45.00    Pack years: 45.00  Types: Cigarettes    Start date: 03/02/1980  . Smokeless tobacco: Never Used  . Tobacco comment: every other day  Substance Use Topics  . Alcohol use: No    Alcohol/week: 0.0 standard drinks   Objective:   BP 135/84 (BP Location: Left Arm, Patient Position: Sitting, Cuff Size: Large)   Pulse 96   Temp 97.8 F (36.6 C) (Oral)   Resp 16   Wt 215 lb 6.4 oz (97.7 kg)   SpO2 92%   BMI 31.81 kg/m  Vitals:   06/13/18 1656  BP: 135/84  Pulse: 96  Resp: 16  Temp: 97.8 F (36.6 C)  TempSrc: Oral  SpO2: 92%  Weight: 215 lb 6.4 oz (97.7 kg)     Physical Exam  Constitutional: She appears well-developed and well-nourished. No distress.  Neck: Normal range of motion. Neck supple.  Cardiovascular: Normal rate, regular rhythm and normal heart sounds. Exam reveals no gallop and no friction rub.  No murmur heard. Pulmonary/Chest: Effort normal and breath sounds normal. No respiratory distress. She has no wheezes. She has no rales.  Abdominal: Bowel sounds are normal. There is no tenderness.    Skin: She is  not diaphoretic.  Vitals reviewed.      Assessment & Plan:     1. Diabetes mellitus without complication (HCC) Stable at 7.2. Continue metformin XR 500mg  (take 2 tabs -1000mg XR). I will see her back in 3 months for recheck.  - POCT glycosylated hemoglobin (Hb A1C)  2. Diverticulitis of large intestine with abscess, unspecified bleeding status Doing well. Continue augmentin until completed. Drain removed yesterday. Drain site appears to be healing well. Colonoscopy to be scheduled in January. Patient aware to call GI office as they called her today for appt.        Mar Daring, PA-C  [facility]

## 2018-06-13 NOTE — Patient Instructions (Signed)
Diverticulitis °Diverticulitis is infection or inflammation of small pouches (diverticula) in the colon that form due to a condition called diverticulosis. Diverticula can trap stool (feces) and bacteria, causing infection and inflammation. °Diverticulitis may cause severe stomach pain and diarrhea. It may lead to tissue damage in the colon that causes bleeding. The diverticula may also burst (rupture) and cause infected stool to enter other areas of the abdomen. °Complications of diverticulitis can include: °· Bleeding. °· Severe infection. °· Severe pain. °· Rupture (perforation) of the colon. °· Blockage (obstruction) of the colon. ° °What are the causes? °This condition is caused by stool becoming trapped in the diverticula, which allows bacteria to grow in the diverticula. This leads to inflammation and infection. °What increases the risk? °You are more likely to develop this condition if: °· You have diverticulosis. The risk for diverticulosis increases if: °? You are overweight or obese. °? You use tobacco products. °? You do not get enough exercise. °· You eat a diet that does not include enough fiber. High-fiber foods include fruits, vegetables, beans, nuts, and whole grains. ° °What are the signs or symptoms? °Symptoms of this condition may include: °· Pain and tenderness in the abdomen. The pain is normally located on the left side of the abdomen, but it may occur in other areas. °· Fever and chills. °· Bloating. °· Cramping. °· Nausea. °· Vomiting. °· Changes in bowel routines. °· Blood in your stool. ° °How is this diagnosed? °This condition is diagnosed based on: °· Your medical history. °· A physical exam. °· Tests to make sure there is nothing else causing your condition. These tests may include: °? Blood tests. °? Urine tests. °? Imaging tests of the abdomen, including X-rays, ultrasounds, MRIs, or CT scans. ° °How is this treated? °Most cases of this condition are mild and can be treated at home.  Treatment may include: °· Taking over-the-counter pain medicines. °· Following a clear liquid diet. °· Taking antibiotic medicines by mouth. °· Rest. ° °More severe cases may need to be treated at a hospital. Treatment may include: °· Not eating or drinking. °· Taking prescription pain medicine. °· Receiving antibiotic medicines through an IV tube. °· Receiving fluids and nutrition through an IV tube. °· Surgery. ° °When your condition is under control, your health care provider may recommend that you have a colonoscopy. This is an exam to look at the entire large intestine. During the exam, a lubricated, bendable tube is inserted into the anus and then passed into the rectum, colon, and other parts of the large intestine. A colonoscopy can show how severe your diverticula are and whether something else may be causing your symptoms. °Follow these instructions at home: °Medicines °· Take over-the-counter and prescription medicines only as told by your health care provider. These include fiber supplements, probiotics, and stool softeners. °· If you were prescribed an antibiotic medicine, take it as told by your health care provider. Do not stop taking the antibiotic even if you start to feel better. °· Do not drive or use heavy machinery while taking prescription pain medicine. °General instructions °· Follow a full liquid diet or another diet as directed by your health care provider. After your symptoms improve, your health care provider may tell you to change your diet. He or she may recommend that you eat a diet that contains at least 25 g (25 grams) of fiber daily. Fiber makes it easier to pass stool. Healthy sources of fiber include: °? Berries. One cup   contains 4-8 grams of fiber. °? Beans or lentils. One half cup contains 5-8 grams of fiber. °? Green vegetables. One cup contains 4 grams of fiber. °· Exercise for at least 30 minutes, 3 times each week. You should exercise hard enough to raise your heart rate and  break a sweat. °· Keep all follow-up visits as told by your health care provider. This is important. You may need a colonoscopy. °Contact a health care provider if: °· Your pain does not improve. °· You have a hard time drinking or eating food. °· Your bowel movements do not return to normal. °Get help right away if: °· Your pain gets worse. °· Your symptoms do not get better with treatment. °· Your symptoms suddenly get worse. °· You have a fever. °· You vomit more than one time. °· You have stools that are bloody, black, or tarry. °Summary °· Diverticulitis is infection or inflammation of small pouches (diverticula) in the colon that form due to a condition called diverticulosis. Diverticula can trap stool (feces) and bacteria, causing infection and inflammation. °· You are at higher risk for this condition if you have diverticulosis and you eat a diet that does not include enough fiber. °· Most cases of this condition are mild and can be treated at home. More severe cases may need to be treated at a hospital. °· When your condition is under control, your health care provider may recommend that you have an exam called a colonoscopy. This exam can show how severe your diverticula are and whether something else may be causing your symptoms. °This information is not intended to replace advice given to you by your health care provider. Make sure you discuss any questions you have with your health care provider. °Document Released: 04/27/2005 Document Revised: 08/20/2016 Document Reviewed: 08/20/2016 °Elsevier Interactive Patient Education © 2018 [employer] ° °

## 2018-06-14 ENCOUNTER — Encounter: Payer: Self-pay | Admitting: *Deleted

## 2018-06-19 ENCOUNTER — Other Ambulatory Visit: Payer: Self-pay | Admitting: Physician Assistant

## 2018-06-19 DIAGNOSIS — E7800 Pure hypercholesterolemia, unspecified: Secondary | ICD-10-CM

## 2018-06-20 ENCOUNTER — Other Ambulatory Visit
Admission: RE | Admit: 2018-06-20 | Discharge: 2018-06-20 | Disposition: A | Discharge status: Home / Self Care | Payer: BLUE CROSS/BLUE SHIELD | Source: Ambulatory Visit | Attending: Surgery | Admitting: Surgery

## 2018-06-20 ENCOUNTER — Telehealth: Payer: Self-pay | Admitting: *Deleted

## 2018-06-20 ENCOUNTER — Telehealth: Payer: Self-pay

## 2018-06-20 ENCOUNTER — Other Ambulatory Visit: Payer: Self-pay

## 2018-06-20 ENCOUNTER — Ambulatory Visit: Payer: BC Managed Care – PPO

## 2018-06-20 ENCOUNTER — Ambulatory Visit
Admission: RE | Admit: 2018-06-20 | Discharge: 2018-06-20 | Disposition: A | Discharge status: Home / Self Care | Payer: BLUE CROSS/BLUE SHIELD | Source: Ambulatory Visit | Attending: Surgery | Admitting: Surgery

## 2018-06-20 DIAGNOSIS — R935 Abnormal findings on diagnostic imaging of other abdominal regions, including retroperitoneum: Secondary | ICD-10-CM | POA: Diagnosis not present

## 2018-06-20 DIAGNOSIS — I700 Atherosclerosis of aorta: Secondary | ICD-10-CM | POA: Insufficient documentation

## 2018-06-20 DIAGNOSIS — R1084 Generalized abdominal pain: Secondary | ICD-10-CM

## 2018-06-20 DIAGNOSIS — K5730 Diverticulosis of large intestine without perforation or abscess without bleeding: Secondary | ICD-10-CM | POA: Insufficient documentation

## 2018-06-20 LAB — CBC WITH DIFFERENTIAL/PLATELET
Abs Immature Granulocytes: 0.03 10*3/uL (ref 0.00–0.07)
Basophils Absolute: 0.1 10*3/uL (ref 0.0–0.1)
Basophils Relative: 1 %
Eosinophils Absolute: 0.1 10*3/uL (ref 0.0–0.5)
Eosinophils Relative: 2 %
HCT: 43.8 % (ref 36.0–46.0)
Hemoglobin: 14.3 g/dL (ref 12.0–15.0)
Immature Granulocytes: 0 %
Lymphocytes Relative: 26 %
Lymphs Abs: 2.1 10*3/uL (ref 0.7–4.0)
MCH: 30.8 pg (ref 26.0–34.0)
MCHC: 32.6 g/dL (ref 30.0–36.0)
MCV: 94.2 fL (ref 80.0–100.0)
Monocytes Absolute: 0.8 10*3/uL (ref 0.1–1.0)
Monocytes Relative: 10 %
Neutro Abs: 5 10*3/uL (ref 1.7–7.7)
Neutrophils Relative %: 61 %
Platelets: 330 10*3/uL (ref 150–400)
RBC: 4.65 MIL/uL (ref 3.87–5.11)
RDW: 13.5 % (ref 11.5–15.5)
WBC: 8 10*3/uL (ref 4.0–10.5)
nRBC: 0 % (ref 0.0–0.2)

## 2018-06-20 LAB — CREATININE, SERUM
Creatinine, Ser: 0.71 mg/dL (ref 0.44–1.00)
GFR calc Af Amer: >60 mL/min (ref >60)
GFR calc non Af Amer: >60 mL/min (ref >60)

## 2018-06-20 MED ORDER — AMOXICILLIN-POT CLAVULANATE 875-125 MG PO TABS: 1.0000 | ORAL_TABLET | Freq: Two times a day (BID) | ORAL | 0 refills | Status: AC

## 2018-06-20 MED ORDER — IOPAMIDOL (ISOVUE-300) INJECTION 61%
100.0000 mL | Freq: Once | INTRAVENOUS | Status: AC | PRN
  Administered 2018-06-20: 100 mL via INTRAVENOUS

## 2018-06-20 NOTE — Telephone Encounter (Signed)
Patient called and states that she started hurting in the same place as her diverticulitis attack she had two weeks ago. She reports the pain started on Monday and yesterday she was very nauseated but no vomiting. She only had Gatorade and water. Today she has continued pain that does not go away, still only taking liquids today.  Spoke with Dr Hampton Abbot and we are going to have her do a CT abdomen and pelvis with contrast and CBC stat.  The patient will report to [facility] today now to have this done.

## 2018-06-20 NOTE — Telephone Encounter (Signed)
Message left on patient's cell phone to call the office.   Dr. Hampton Abbot has reviewed patient's CT scan that she had done today at [facility]. Area of concern was smaller on today's CT.   The patient will need a 2 week course of Augmentin. This has been sent in to [facility] today.   The patient will need a follow up appointment with Dr. Hampton Abbot in 1-2 weeks.

## 2018-06-20 NOTE — Telephone Encounter (Signed)
Patient called the office back and was notified of CT results.   The patient is aware to pick up her prescription and begin today.   A follow up appointment has been arranged for one week with Dr. Hampton Abbot.

## 2018-06-27 ENCOUNTER — Encounter: Payer: Self-pay | Admitting: Surgery

## 2018-06-27 ENCOUNTER — Other Ambulatory Visit: Payer: Self-pay

## 2018-06-27 ENCOUNTER — Ambulatory Visit (INDEPENDENT_AMBULATORY_CARE_PROVIDER_SITE_OTHER): Payer: BLUE CROSS/BLUE SHIELD | Admitting: Surgery

## 2018-06-27 VITALS — BP 171/87 | HR 106 | Temp 97.9°F | Resp 22 | Ht 69.0 in | Wt 217.0 lb

## 2018-06-27 DIAGNOSIS — K5720 Diverticulitis of large intestine with perforation and abscess without bleeding: Secondary | ICD-10-CM | POA: Diagnosis not present

## 2018-06-27 MED ORDER — HYDROCODONE-ACETAMINOPHEN 5-325 MG PO TABS: 1.0000 | ORAL_TABLET | Freq: Four times a day (QID) | ORAL | 0 refills | Status: DC | PRN

## 2018-06-27 NOTE — Patient Instructions (Addendum)
Patient is to return to the office in 2 weeks .  Call the office with any questions or concerns.  Docusate capsules What is this medicine? DOCUSATE (doc CUE sayt) is stool softener. It helps prevent constipation and straining or discomfort associated with hard or dry stools. This medicine may be used for other purposes; ask your health care provider or pharmacist if you have questions. COMMON BRAND NAME(S): Colace, Colace Clear, Correctol, D.O.S., DC, Doc-Q-Lace, DocuLace, Docusoft S, DOK, DOK Extra Strength, Dulcolax, Genasoft, Kao-Tin, Kaopectate Liqui-Gels, Phillips Stool Softener, Stool Softener, Stool Softner DC, Sulfolax, Sur-Q-Lax, Surfak, Uni-Ease What should I tell my health care provider before I take this medicine? They need to know if you have any of these conditions: -nausea or vomiting -severe constipation -stomach pain -sudden change in bowel habit lasting more than 2 weeks -an unusual or allergic reaction to docusate, other medicines, foods, dyes, or preservatives -pregnant or trying to get pregnant -breast-feeding How should I use this medicine? Take this medicine by mouth with a glass of water. Follow the directions on the label. Take your doses at regular intervals. Do not take your medicine more often than directed. Talk to your pediatrician regarding the use of this medicine in children. While this medicine may be prescribed for children as young as 2 years for selected conditions, precautions do apply. Overdosage: If you think you have taken too much of this medicine contact a poison control center or emergency room at once. NOTE: This medicine is only for you. Do not share this medicine with others. What if I miss a dose? If you miss a dose, take it as soon as you can. If it is almost time for your next dose, take only that dose. Do not take double or extra doses. What may interact with this medicine? -mineral oil This list may not describe all possible interactions.  Give your health care provider a list of all the medicines, herbs, non-prescription drugs, or dietary supplements you use. Also tell them if you smoke, drink alcohol, or use illegal drugs. Some items may interact with your medicine. What should I watch for while using this medicine? Do not use for more than one week without advice from your doctor or health care professional. If your constipation returns, check with your doctor or health care professional. Drink plenty of water while taking this medicine. Drinking water helps decrease constipation. Stop using this medicine and contact your doctor or health care professional if you experience any rectal bleeding or do not have a bowel movement after use. These could be signs of a more serious condition. What side effects may I notice from receiving this medicine? Side effects that you should report to your doctor or health care professional as soon as possible: -allergic reactions like skin rash, itching or hives, swelling of the face, lips, or tongue Side effects that usually do not require medical attention (report to your doctor or health care professional if they continue or are bothersome): -diarrhea -stomach cramps -throat irritation This list may not describe all possible side effects. Call your doctor for medical advice about side effects. You may report side effects to FDA at 1-800-FDA-1088. Where should I keep my medicine? Keep out of the reach of children. Store at room temperature between 15 and 30 degrees C (59 and 86 degrees F). Throw away any unused medicine after the expiration date. NOTE: This sheet is a summary. It may not cover all possible information. If you have questions about this medicine,  talk to your doctor, pharmacist, or health care provider.  2018 Elsevier/Gold Standard (2007-11-08 15:56:49)   Low-Fiber Diet Fiber is found in fruits, vegetables, and whole grains. A low-fiber diet restricts fibrous foods that are not  digested in the small intestine. A diet containing about 10-15 grams of fiber per day is considered low fiber. Low-fiber diets may be used to:  Promote healing and rest the bowel during intestinal flare-ups.  Prevent blockage of a partially obstructed or narrowed gastrointestinal tract.  Reduce fecal weight and volume.  Slow the movement of feces.  You may be on a low-fiber diet as a transitional diet following surgery, after an injury (trauma), or because of a short (acute) or lifelong (chronic) illness. Your health care provider will determine the length of time you need to stay on this diet. What do I need to know about a low-fiber diet? Always check the fiber content on the packaging's Nutrition Facts label, especially on foods from the grains list. Ask your dietitian if you have questions about specific foods that are related to your condition, especially if the food is not listed below. In general, a low-fiber food will have less than 2 g of fiber. What foods can I eat? Grains All breads and crackers made with white flour. Sweet rolls, doughnuts, waffles, pancakes, [place] toast, bagels. Pretzels, Melba toast, zwieback. Well-cooked cereals, such as cornmeal, farina, or cream cereals. Dry cereals that do not contain whole grains, fruit, or nuts, such as refined corn, wheat, rice, and oat cereals. Potatoes prepared any way without skins, plain pastas and noodles, refined white rice. Use white flour for baking and making sauces. Use allowed list of grains for casseroles, dumplings, and puddings. Vegetables Strained tomato and vegetable juices. Fresh lettuce, cucumber, spinach. Well-cooked (no skin or pulp) or canned vegetables, such as asparagus, bean sprouts, beets, carrots, green beans, mushrooms, potatoes, pumpkin, spinach, yellow squash, tomato sauce/puree, turnips, yams, and zucchini. Keep servings limited to  cup. Fruits All fruit juices except prune juice. Cooked or canned fruits without  skin and seeds, such as applesauce, apricots, cherries, fruit cocktail, grapefruit, grapes, mandarin oranges, melons, peaches, pears, pineapple, and plums. Fresh fruits without skin, such as apricots, avocados, bananas, melons, pineapple, nectarines, and peaches. Keep servings limited to  cup or 1 piece. Meat and Other Protein Sources Ground or well-cooked tender beef, ham, veal, lamb, or poultry. Eggs, plain cheese. Fish, oysters, shrimp, lobster, and other seafood. Liver, organ meats. Smooth nut butters. Dairy All milk products and alternative dairy substitutes, such as soy, rice, almond, and coconut, not containing added whole nuts, seeds, or added fruit. Beverages Decaf coffee, fruit, and vegetable juices or smoothies (small amounts, with no pulp or skins, and with fruits from allowed list), sports drinks, herbal tea. Condiments Ketchup, mustard, vinegar, cream sauce, cheese sauce, cocoa powder. Spices in moderation, such as allspice, basil, bay leaves, celery powder or leaves, cinnamon, cumin powder, curry powder, ginger, mace, marjoram, onion or garlic powder, oregano, paprika, parsley flakes, ground pepper, rosemary, sage, savory, tarragon, thyme, and turmeric. Sweets and Desserts Plain cakes and cookies, pie made with allowed fruit, pudding, custard, cream pie. Gelatin, fruit, ice, sherbet, frozen ice pops. Ice cream, ice milk without nuts. Plain hard candy, honey, jelly, molasses, syrup, sugar, chocolate syrup, gumdrops, marshmallows. Limit overall sugar intake. Fats and Oil Margarine, butter, cream, mayonnaise, salad oils, plain salad dressings made from allowed foods. Choose healthy fats such as olive oil, canola oil, and omega-3 fatty acids (such as found in  salmon or tuna) when possible. Other Bouillon, broth, or cream soups made from allowed foods. Any strained soup. Casseroles or mixed dishes made with allowed foods. The items listed above may not be a complete list of recommended  foods or beverages. Contact your dietitian for more options. What foods are not recommended? Grains All whole wheat and whole grain breads and crackers. Multigrains, rye, bran seeds, nuts, or coconut. Cereals containing whole grains, multigrains, bran, coconut, nuts, raisins. Cooked or dry oatmeal, steel-cut oats. Coarse wheat cereals, granola. Cereals advertised as high fiber. Potato skins. Whole grain pasta, wild or brown rice. Popcorn. Coconut flour. Bran, buckwheat, corn bread, multigrains, rye, wheat germ. Vegetables Fresh, cooked or canned vegetables, such as artichokes, asparagus, beet greens, broccoli, Brussels sprouts, cabbage, celery, cauliflower, corn, eggplant, kale, legumes or beans, okra, peas, and tomatoes. Avoid large servings of any vegetables, especially raw vegetables. Fruits Fresh fruits, such as apples with or without skin, berries, cherries, figs, grapes, grapefruit, guavas, kiwis, mangoes, oranges, papayas, pears, persimmons, pineapple, and pomegranate. Prune juice and juices with pulp, stewed or dried prunes. Dried fruits, dates, raisins. Fruit seeds or skins. Avoid large servings of all fresh fruits. Meats and Other Protein Sources Tough, fibrous meats with gristle. Chunky nut butter. Cheese made with seeds, nuts, or other foods not recommended. Nuts, seeds, legumes (beans, including baked beans), dried peas, beans, lentils. Dairy Yogurt or cheese that contains nuts, seeds, or added fruit. Beverages Fruit juices with high pulp, prune juice. Caffeinated coffee and teas. Condiments Coconut, maple syrup, pickles, olives. Sweets and Desserts Desserts, cookies, or candies that contain nuts or coconut, chunky peanut butter, dried fruits. Jams, preserves with seeds, marmalade. Large amounts of sugar and sweets. Any other dessert made with fruits from the not recommended list. Other Soups made from vegetables that are not recommended or that contain other foods not  recommended. The items listed above may not be a complete list of foods and beverages to avoid. Contact your dietitian for more information. This information is not intended to replace advice given to you by your health care provider. Make sure you discuss any questions you have with your health care provider. Document Released: 01/07/2002 Document Revised: 12/24/2015 Document Reviewed: 06/10/2013 Elsevier Interactive Patient Education  2017 [employer].

## 2018-06-27 NOTE — Progress Notes (Signed)
06/27/2018  History of Present Illness: Linda Bradford is a 62 y.o. female with sigmoid diverticulitis with small abscess requiring percutaneous drainage by IR on 10/31.  Her drain was removed on 11/12.  She called the office on 11/20 reporting worsening pain again in the left lower quadrant.  Repeat CT scan was obtained which showed a smaller abscess, measuring 1.5 cm, down from 3.2 cm.  Given the smaller size, I ordered a two week course of Augmentin.  She's here now for follow up.  She reports that she's intermittently having good and bad days.  Some days she has no pain, and some days she has worse pain.  Overall she feels that it's improving.  Today there's no pain.  She had been trying a higher fiber diet.  Past Medical History: Past Medical History:  Diagnosis Date  . Anxiety   . COPD (chronic obstructive pulmonary disease) ([place])   . Depression   . Diabetes mellitus without complication ([place])   . Fatigue   . Headache   . Hypertension   . Thyroid disease      Past Surgical History: Past Surgical History:  Procedure Laterality Date  . APPENDECTOMY    . TUBAL LIGATION      Home Medications: Prior to Admission medications   Medication Sig Start Date End Date Taking? Authorizing Provider  acetaminophen (TYLENOL) 500 MG tablet Take 1,000 mg by mouth every 6 (six) hours as needed for mild pain or moderate pain.     [provider]  albuterol (PROVENTIL HFA;VENTOLIN HFA) 108 (90 Base) MCG/ACT inhaler Inhale 1 puff into the lungs every 4 (four) hours as needed. 08/04/17   Mar Daring, PA-C  amoxicillin-clavulanate (AUGMENTIN) 875-125 MG tablet Take 1 tablet by mouth 2 (two) times daily for 14 days. 06/20/18 07/04/18  Olean Ree, MD  benzonatate (TESSALON) 200 MG capsule Take 1 capsule (200 mg total) by mouth 3 (three) times daily as needed for cough. Patient not taking: Reported on 06/13/2018 04/17/18   Mar Daring, PA-C  BREO ELLIPTA 200-25 MCG/INH AEPB INHALE  1 PUFF BY MOUTH ONCE DAILY Patient taking differently: Inhale 1 puff into the lungs daily.  01/25/18   Mar Daring, PA-C  clonazePAM (KLONOPIN) 0.5 MG tablet TAKE 1/2 TABLET BY MOUTH TWICE DAILY AS NEEDED 02/23/18   Mar Daring, PA-C  Cyanocobalamin (RA VITAMIN B-12 TR) 1000 MCG TBCR Take 1,000 mcg by mouth daily.     [provider]  diclofenac sodium (VOLTAREN) 1 % GEL Apply 4 g topically 4 (four) times daily as needed. 03/01/18   Mar Daring, PA-C  DULoxetine (CYMBALTA) 60 MG capsule TAKE 1 CAPSULE BY MOUTH TWICE DAILY 02/23/18   Mar Daring, PA-C  ergocalciferol (VITAMIN D2) 1.25 MG (50000 UT) capsule ergocalciferol (vitamin D2) 1,250 mcg (50,000 unit) capsule    [provider]  ezetimibe (ZETIA) 10 MG tablet TAKE 1 TABLET BY MOUTH ONCE DAILY 06/20/18   Mar Daring, PA-C  gabapentin (NEURONTIN) 300 MG capsule Take 300-1,200 mg by mouth See admin instructions. Take 1 capsule (300MG ) by mouth every morning and 4 capsules (1200MG ) by mouth every night at bedtime    [provider]  glucose blood (CONTOUR NEXT TEST) test strip To check blood sugar once daily 06/09/17   Mar Daring, PA-C  ipratropium-albuterol (DUONEB) 0.5-2.5 (3) MG/3ML SOLN Take 3 mLs by nebulization every 6 (six) hours as needed. 11/04/15   Mar Daring, PA-C  levothyroxine (SYNTHROID, LEVOTHROID) 75  MCG tablet TAKE 1 TABLET BY MOUTH ONCE DAILY ON AN EMPTY STOMACH. WAIT 30 MINUTES BEFORE TAKING OTHER MEDS. Patient taking differently: Take 75 mcg by mouth daily.  02/23/18   Mar Daring, PA-C  losartan (COZAAR) 50 MG tablet TAKE 1 TABLET BY MOUTH ONCE DAILY 10/26/17   Mar Daring, PA-C  meclizine (ANTIVERT) 25 MG tablet Take 1 tablet (25 mg total) by mouth 3 (three) times daily as needed for dizziness. Patient not taking: Reported on 06/13/2018 05/15/18   Mar Daring, PA-C  meloxicam (MOBIC) 15 MG tablet TAKE 1 TABLET BY MOUTH  ONCE DAILY. 03/28/18   Mar Daring, PA-C  metFORMIN (GLUCOPHAGE-XR) 500 MG 24 hr tablet TAKE 2 TABLETS BY MOUTH ONCE DAILY WITH SUPPER 03/01/18   Burnette, Clearnce Sorrel, PA-C  nicotine (NICODERM CQ - DOSED IN MG/24 HOURS) 21 mg/24hr patch Place 1 patch (21 mg total) onto the skin daily. Patient not taking: Reported on 06/13/2018 06/02/18   Tylene Fantasia, PA-C  polyethylene glycol [facility] / Floria Raveling) packet Take 17 g by mouth daily. Patient not taking: Reported on 06/13/2018 06/02/18   Tylene Fantasia, PA-C  predniSONE (DELTASONE) 10 MG tablet Take 6 tabs PO on day 1&2, 5 tabs PO on day 3&4, 4 tabs PO on day 5&6, 3 tabs PO on day 7&8, 2 tabs PO on day 9&10, 1 tab PO on day 11&12. Patient not taking: Reported on 06/13/2018 05/15/18   Mar Daring, PA-C  pyridOXINE (VITAMIN B-6) 100 MG tablet Take 100 mg by mouth daily.    [provider]  THEO-24 300 MG 24 hr capsule TAKE 1 CAPSULE BY MOUTH ONCE DAILY 06/11/18   Mar Daring, PA-C  tiZANidine (ZANAFLEX) 4 MG capsule TAKE 1 CAPSULE BY MOUTH 3 TIMES DAILY Patient taking differently: Take 4 mg by mouth at bedtime as needed for muscle spasms.  02/19/18   Mar Daring, PA-C  Vitamin D, Ergocalciferol, (DRISDOL) 50000 units CAPS capsule TAKE 1 CAPSULE BY MOUTH EVERY 7 DAYS Patient taking differently: Take 50,000 Units by mouth every Tuesday.  12/01/17   Mar Daring, PA-C    Allergies: Allergies  Allergen Reactions  . Dexilant [Dexlansoprazole] Nausea Only and Other (See Comments)    Dizziness    Review of Systems: Review of Systems  Constitutional: Negative for chills and fever.  Gastrointestinal: Positive for abdominal pain (left lower quadrant). Negative for nausea and vomiting.    Physical Exam BP (!) 171/87   Pulse (!) 106   Temp 97.9 F (36.6 C) (Temporal)   Resp (!) 22   Ht 5\' 9"  (1.753 m)   Wt 217 lb (98.4 kg)   SpO2 92%   BMI 32.05 kg/m  CONSTITUTIONAL: No acute distress HEENT:   Normocephalic, atraumatic, extraocular motion intact. RESPIRATORY:  Lungs are clear, and breath sounds are equal bilaterally. Normal respiratory effort without pathologic use of accessory muscles. CARDIOVASCULAR: Heart is regular without murmurs, gallops, or rubs. GI: The abdomen is soft, obese, nondistended, with mild soreness to palpation in left lower quadrant, same area as before.  No peritonitis.  NEUROLOGIC:  Motor and sensation is grossly normal.  Cranial nerves are grossly intact. PSYCH:  Alert and oriented to person, place and time. Affect is normal.  Labs/Imaging: CT 06/20/18: IMPRESSION: 1. Decreased size of focal collection within the LEFT LOWER pelvis, now measuring 1.5 cm, previously 3.2 cm. No pneumoperitoneum, acute inflammation or new/enlarging collection/abscess. 2. Colonic diverticulosis. 3.  Aortic Atherosclerosis (ICD10-I70.0).  Labs  06/20/18: WBC 8.0, Neutrophils 61, Hgb 14.3, Hct 43.8, Plt 330.  Assessment and Plan: This is a 62 y.o. female with diverticulitis with abscess.  --Discussed with patient that at this point she should be on a low fiber diet.  She may use stool softeners for constipation.  Avoid red meats. --Continue antibiotic.  She has one more week left.  If there is no improvement at that point, she knows to call so we can refill her abx for a more prolonged course, given the small size of the abscess.  --She will follow up in two weeks to see how she's progressing.  She is aware that if there continues to be no improvement overall, she may require surgery if there is no good option for abscess drainage.  My goal is to manage this conservatively to avoid a colostomy, but patient is aware that she may need it.  Face-to-face time spent with the patient and care providers was 15 minutes, with more than 50% of the time spent counseling, educating, and coordinating care of the patient.     Melvyn Neth, [facility]

## 2018-07-01 HISTORY — PX: OTHER SURGICAL HISTORY: SHX169

## 2018-07-05 ENCOUNTER — Ambulatory Visit (INDEPENDENT_AMBULATORY_CARE_PROVIDER_SITE_OTHER): Payer: BLUE CROSS/BLUE SHIELD | Admitting: Surgery

## 2018-07-05 ENCOUNTER — Encounter: Payer: Self-pay | Admitting: Surgery

## 2018-07-05 ENCOUNTER — Encounter: Payer: Self-pay | Admitting: *Deleted

## 2018-07-05 ENCOUNTER — Inpatient Hospital Stay
Admission: AD | Admit: 2018-07-05 | Discharge: 2018-07-10 | DRG: 392 | Disposition: A | Discharge status: Home / Self Care | Payer: BLUE CROSS/BLUE SHIELD | Source: Ambulatory Visit | Attending: Surgery | Admitting: Surgery

## 2018-07-05 ENCOUNTER — Other Ambulatory Visit: Payer: Self-pay

## 2018-07-05 ENCOUNTER — Ambulatory Visit
Admission: RE | Admit: 2018-07-05 | Discharge: 2018-07-05 | Disposition: A | Discharge status: Home / Self Care | Payer: BLUE CROSS/BLUE SHIELD | Source: Ambulatory Visit | Attending: Surgery | Admitting: Surgery

## 2018-07-05 ENCOUNTER — Ambulatory Visit: Admission: RE | Admit: 2018-07-05 | Payer: BLUE CROSS/BLUE SHIELD | Source: Ambulatory Visit

## 2018-07-05 ENCOUNTER — Other Ambulatory Visit
Admission: RE | Admit: 2018-07-05 | Discharge: 2018-07-05 | Disposition: A | Discharge status: Home / Self Care | Payer: BLUE CROSS/BLUE SHIELD | Source: Ambulatory Visit | Attending: Surgery | Admitting: Surgery

## 2018-07-05 ENCOUNTER — Telehealth: Payer: Self-pay | Admitting: *Deleted

## 2018-07-05 VITALS — BP 107/69 | HR 79 | Temp 97.3°F | Resp 20 | Ht 69.0 in | Wt 211.0 lb

## 2018-07-05 DIAGNOSIS — Z6831 Body mass index (BMI) 31.0-31.9, adult: Secondary | ICD-10-CM | POA: Diagnosis not present

## 2018-07-05 DIAGNOSIS — Z888 Allergy status to other drugs, medicaments and biological substances status: Secondary | ICD-10-CM | POA: Diagnosis not present

## 2018-07-05 DIAGNOSIS — I10 Essential (primary) hypertension: Secondary | ICD-10-CM | POA: Diagnosis present

## 2018-07-05 DIAGNOSIS — K5720 Diverticulitis of large intestine with perforation and abscess without bleeding: Secondary | ICD-10-CM

## 2018-07-05 DIAGNOSIS — E119 Type 2 diabetes mellitus without complications: Secondary | ICD-10-CM | POA: Diagnosis not present

## 2018-07-05 DIAGNOSIS — J449 Chronic obstructive pulmonary disease, unspecified: Secondary | ICD-10-CM | POA: Diagnosis not present

## 2018-07-05 DIAGNOSIS — Z7984 Long term (current) use of oral hypoglycemic drugs: Secondary | ICD-10-CM | POA: Diagnosis not present

## 2018-07-05 DIAGNOSIS — R197 Diarrhea, unspecified: Secondary | ICD-10-CM | POA: Diagnosis not present

## 2018-07-05 DIAGNOSIS — E039 Hypothyroidism, unspecified: Secondary | ICD-10-CM | POA: Diagnosis present

## 2018-07-05 DIAGNOSIS — Z7951 Long term (current) use of inhaled steroids: Secondary | ICD-10-CM | POA: Diagnosis not present

## 2018-07-05 DIAGNOSIS — E669 Obesity, unspecified: Secondary | ICD-10-CM | POA: Diagnosis present

## 2018-07-05 DIAGNOSIS — Z79899 Other long term (current) drug therapy: Secondary | ICD-10-CM | POA: Diagnosis not present

## 2018-07-05 DIAGNOSIS — N739 Female pelvic inflammatory disease, unspecified: Secondary | ICD-10-CM | POA: Diagnosis not present

## 2018-07-05 DIAGNOSIS — F419 Anxiety disorder, unspecified: Secondary | ICD-10-CM | POA: Diagnosis present

## 2018-07-05 DIAGNOSIS — F17210 Nicotine dependence, cigarettes, uncomplicated: Secondary | ICD-10-CM | POA: Diagnosis not present

## 2018-07-05 DIAGNOSIS — F329 Major depressive disorder, single episode, unspecified: Secondary | ICD-10-CM | POA: Diagnosis present

## 2018-07-05 LAB — BASIC METABOLIC PANEL
Anion gap: 11 (ref 5–15)
BUN: 14 mg/dL (ref 8–23)
CO2: 26 mmol/L (ref 22–32)
Calcium: 9.2 mg/dL (ref 8.9–10.3)
Chloride: 101 mmol/L (ref 98–111)
Creatinine, Ser: 0.49 mg/dL (ref 0.44–1.00)
GFR calc Af Amer: >60 mL/min (ref >60)
GFR calc non Af Amer: >60 mL/min (ref >60)
Glucose, Bld: 145 mg/dL — ABNORMAL HIGH (ref 70–99)
Potassium: 4.2 mmol/L (ref 3.5–5.1)
Sodium: 138 mmol/L (ref 135–145)

## 2018-07-05 LAB — CBC WITH DIFFERENTIAL/PLATELET
Abs Immature Granulocytes: 0.08 10*3/uL — ABNORMAL HIGH (ref 0.00–0.07)
Basophils Absolute: 0.1 10*3/uL (ref 0.0–0.1)
Basophils Relative: 0 %
Eosinophils Absolute: 0.2 10*3/uL (ref 0.0–0.5)
Eosinophils Relative: 1 %
HCT: 35.9 % — ABNORMAL LOW (ref 36.0–46.0)
Hemoglobin: 11.7 g/dL — ABNORMAL LOW (ref 12.0–15.0)
Immature Granulocytes: 1 %
Lymphocytes Relative: 14 %
Lymphs Abs: 1.8 10*3/uL (ref 0.7–4.0)
MCH: 30.5 pg (ref 26.0–34.0)
MCHC: 32.6 g/dL (ref 30.0–36.0)
MCV: 93.5 fL (ref 80.0–100.0)
Monocytes Absolute: 1 10*3/uL (ref 0.1–1.0)
Monocytes Relative: 8 %
Neutro Abs: 9.5 10*3/uL — ABNORMAL HIGH (ref 1.7–7.7)
Neutrophils Relative %: 76 %
Platelets: 380 10*3/uL (ref 150–400)
RBC: 3.84 MIL/uL — ABNORMAL LOW (ref 3.87–5.11)
RDW: 13.2 % (ref 11.5–15.5)
WBC: 12.6 10*3/uL — ABNORMAL HIGH (ref 4.0–10.5)
nRBC: 0 % (ref 0.0–0.2)

## 2018-07-05 LAB — GLUCOSE, CAPILLARY
Glucose-Capillary: 111 mg/dL — ABNORMAL HIGH (ref 70–99)
Glucose-Capillary: 106 mg/dL — ABNORMAL HIGH (ref 70–99)

## 2018-07-05 MED ORDER — ONDANSETRON 4 MG PO TBDP: 4.0000 mg | ORAL_TABLET | Freq: Four times a day (QID) | ORAL | Status: DC | PRN

## 2018-07-05 MED ORDER — GABAPENTIN 300 MG PO CAPS
300.0000 mg | ORAL_CAPSULE | Freq: Every morning | ORAL | Status: DC
  Administered 2018-07-06 – 2018-07-10 (×5): 300 mg via ORAL
  Filled 2018-07-05 (×5): qty 1

## 2018-07-05 MED ORDER — SODIUM CHLORIDE 0.9 % IV SOLN
INTRAVENOUS | Status: DC
  Administered 2018-07-05 – 2018-07-09 (×4): via INTRAVENOUS

## 2018-07-05 MED ORDER — ALBUTEROL SULFATE (2.5 MG/3ML) 0.083% IN NEBU: 2.5000 mg | INHALATION_SOLUTION | RESPIRATORY_TRACT | Status: DC | PRN

## 2018-07-05 MED ORDER — ALBUTEROL SULFATE HFA 108 (90 BASE) MCG/ACT IN AERS: 1.0000 | INHALATION_SPRAY | RESPIRATORY_TRACT | Status: DC | PRN

## 2018-07-05 MED ORDER — INSULIN ASPART 100 UNIT/ML ~~LOC~~ SOLN
0.0000 [IU] | SUBCUTANEOUS | Status: DC
  Administered 2018-07-08: 3 [IU] via SUBCUTANEOUS
  Administered 2018-07-08: 4 [IU] via SUBCUTANEOUS
  Administered 2018-07-09: 3 [IU] via SUBCUTANEOUS
  Filled 2018-07-05 (×3): qty 1

## 2018-07-05 MED ORDER — SODIUM CHLORIDE 0.9 % IV SOLN
INTRAVENOUS | Status: DC | PRN
  Administered 2018-07-05 – 2018-07-06 (×2): 250 mL via INTRAVENOUS
  Administered 2018-07-07: 75 mL via INTRAVENOUS
  Administered 2018-07-07 – 2018-07-09 (×2): 1000 mL via INTRAVENOUS

## 2018-07-05 MED ORDER — IPRATROPIUM-ALBUTEROL 0.5-2.5 (3) MG/3ML IN SOLN: 3.0000 mL | Freq: Four times a day (QID) | RESPIRATORY_TRACT | Status: DC | PRN

## 2018-07-05 MED ORDER — KETOROLAC TROMETHAMINE 30 MG/ML IJ SOLN
30.0000 mg | Freq: Four times a day (QID) | INTRAMUSCULAR | Status: DC
  Administered 2018-07-05 – 2018-07-10 (×18): 30 mg via INTRAVENOUS
  Filled 2018-07-05 (×18): qty 1

## 2018-07-05 MED ORDER — IOPAMIDOL (ISOVUE-300) INJECTION 61%
100.0000 mL | Freq: Once | INTRAVENOUS | Status: AC | PRN
  Administered 2018-07-05: 100 mL via INTRAVENOUS

## 2018-07-05 MED ORDER — PIPERACILLIN-TAZOBACTAM 3.375 G IVPB
3.3750 g | Freq: Three times a day (TID) | INTRAVENOUS | Status: DC
  Administered 2018-07-05 – 2018-07-09 (×12): 3.375 g via INTRAVENOUS
  Filled 2018-07-05 (×13): qty 50

## 2018-07-05 MED ORDER — ONDANSETRON HCL 4 MG/2ML IJ SOLN
4.0000 mg | Freq: Four times a day (QID) | INTRAMUSCULAR | Status: DC | PRN
  Administered 2018-07-05: 4 mg via INTRAVENOUS
  Filled 2018-07-05: qty 2

## 2018-07-05 MED ORDER — HYDRALAZINE HCL 20 MG/ML IJ SOLN: 10.0000 mg | Freq: Four times a day (QID) | INTRAMUSCULAR | Status: DC | PRN

## 2018-07-05 MED ORDER — PANTOPRAZOLE SODIUM 40 MG IV SOLR
40.0000 mg | Freq: Every day | INTRAVENOUS | Status: DC
  Administered 2018-07-05 – 2018-07-09 (×5): 40 mg via INTRAVENOUS
  Filled 2018-07-05 (×5): qty 40

## 2018-07-05 MED ORDER — DULOXETINE HCL 30 MG PO CPEP
60.0000 mg | ORAL_CAPSULE | Freq: Two times a day (BID) | ORAL | Status: DC
  Administered 2018-07-05 – 2018-07-10 (×10): 60 mg via ORAL
  Filled 2018-07-05 (×10): qty 2

## 2018-07-05 MED ORDER — FLUTICASONE FUROATE-VILANTEROL 200-25 MCG/INH IN AEPB
1.0000 | INHALATION_SPRAY | Freq: Every day | RESPIRATORY_TRACT | Status: DC
  Administered 2018-07-06 – 2018-07-10 (×5): 1 via RESPIRATORY_TRACT
  Filled 2018-07-05: qty 28

## 2018-07-05 MED ORDER — LACTATED RINGERS IV SOLN: 125.0000 mL/h | INTRAVENOUS | Status: DC

## 2018-07-05 MED ORDER — HYDROMORPHONE HCL 1 MG/ML IJ SOLN
0.5000 mg | INTRAMUSCULAR | Status: DC | PRN
  Administered 2018-07-05 – 2018-07-08 (×11): 0.5 mg via INTRAVENOUS
  Filled 2018-07-05 (×11): qty 0.5

## 2018-07-05 MED ORDER — GABAPENTIN 400 MG PO CAPS
1200.0000 mg | ORAL_CAPSULE | Freq: Every day | ORAL | Status: DC
  Administered 2018-07-05 – 2018-07-09 (×5): 1200 mg via ORAL
  Filled 2018-07-05 (×2): qty 3
  Filled 2018-07-05: qty 4
  Filled 2018-07-05 (×2): qty 3

## 2018-07-05 MED ORDER — CLONAZEPAM 0.5 MG PO TABS
0.2500 mg | ORAL_TABLET | Freq: Two times a day (BID) | ORAL | Status: DC | PRN
  Administered 2018-07-10: 0.25 mg via ORAL
  Filled 2018-07-05 (×3): qty 1

## 2018-07-05 NOTE — Progress Notes (Signed)
Surgical Clinic Progress/Follow-up Note   HPI:  62 y.o. Female presents to clinic for follow-up evaluation of sigmoid-colonic diverticulitis-associated LLQ abdominal pain. Patient first presented to [facility] ED 05/27/2018 for abdominal pain and was diagnosed on CT with her first episode of sigmoid colonic diverticulitis with a 2.5 cm abscess and extensive sigmoid colonic redundancy and diverticulosis. She was initially treated with antibiotics alone, but then underwent repeat CT on 10/30, which demonstrated increased 3.2 cm peri-colonic abscess, for which image-guided abscess drainage was performed the next day, the following day after which she was discharged home on 11/1 with a prescription for Augmentin. Her drain was then removed in the office 11/12, after which she completed her antibiotics 1.5 days later and felt well x 1 week, after which her LLQ abdominal pain recurred, prompting resumption of Augmentin and repeat CT, which demonstrated on 11/20 persistent/recurrent sigmoid colonic diverticulitis with a 1.5 cm abscess. It is not entirely clear whether this abscess was decreased from original peri-colonic abscess and present at time drain was removed or rather had nearly resolved when drain was removed, but redeveloped and increased until progressively symptomatic. Since resuming antibiotics, patient reports her LLQ abdominal pain improved somewhat, but never completely resolved and over this weekend worsened significantly, particularly after she completed her 2nd 2 week course of antibiotics. Patient also reports nausea without emesis and BM's with Miralax for constipation, though does not take a stool softener to reduce her risk for constipation. She denies having taken Prednisone (for COPD) since the end of October, though continues to smoke cigarettes and has not been checking her blood glucose recently, for which she takes Metformin (but has missed "a few" doses). She says she has not been able to  tolerated food the past few days and otherwise reports sweats, but denies fever/chills, CP, or SOB. Her last colonoscopy was 10 years ago, and she has an appointment with GI this January to schedule routine screening colonoscopy.  Review of Systems:  Constitutional: denies any other weight loss, fever, chills, or sweats  Eyes: denies any other vision changes, history of eye injury  ENT: denies sore throat, hearing problems  Respiratory: denies shortness of breath, wheezing  Cardiovascular: denies chest pain, palpitations  Gastrointestinal: abdominal pain, N/V, and bowel function as per interval history Musculoskeletal: denies any other joint pains or cramps  Skin: Denies any other rashes or skin discolorations  Neurological: denies any other headache, dizziness, weakness  Psychiatric: denies any other depression, anxiety  All other review of systems: otherwise negative   Vital Signs:  BP 107/69   Pulse 79   Temp (!) 97.3 F (36.3 C) (Skin)   Resp 20   Ht 5\' 9"  (1.753 m)   Wt 211 lb (95.7 kg)   SpO2 97%   BMI 31.16 kg/m    Physical Exam:  Constitutional:  -- Obese body habitus  -- Awake, alert, and oriented x3  Eyes:  -- Pupils equally round and reactive to light  -- No scleral icterus  Ear, nose, throat:  -- No jugular venous distension  -- No nasal drainage, bleeding Pulmonary:  -- No crackles -- Equal breath sounds bilaterally -- Breathing non-labored at rest Cardiovascular:  -- S1, S2 present  -- No pericardial rubs  Gastrointestinal:  -- Soft and non-distended with focal LLQ > suprapubic tenderness to palpation, no guarding/rebound tenderness -- No abdominal masses appreciated, pulsatile or otherwise  Musculoskeletal / Integumentary:  -- Wounds or skin discoloration: None appreciated  -- Extremities: B/L UE and LE  FROM, hands and feet warm  Neurologic:  -- Motor function: intact and symmetric  -- Sensation: intact and symmetric   Laboratory studies:  CBC  Latest Ref Rng & Units 06/20/2018 06/01/2018 05/30/2018  WBC 4.0 - 10.5 K/uL 8.0 8.2 8.4  Hemoglobin 12.0 - 15.0 g/dL 14.3 12.9 12.1  Hematocrit 36.0 - 46.0 % 43.8 40.3 36.7  Platelets 150 - 400 K/uL 330 299 239   CMP Latest Ref Rng & Units 06/20/2018 05/28/2018 05/27/2018  Glucose 70 - 99 mg/dL - 141(H) 197(H)  BUN 8 - 23 mg/dL - 12 19  Creatinine 0.44 - 1.00 mg/dL 0.71 0.56 0.65  Sodium 135 - 145 mmol/L - 136 138  Potassium 3.5 - 5.1 mmol/L - 3.7 4.5  Chloride 98 - 111 mmol/L - 104 106  CO2 22 - 32 mmol/L - 25 27  Calcium 8.9 - 10.3 mg/dL - 8.3(L) 8.7(L)  Total Protein 6.5 - 8.1 g/dL - - -  Total Bilirubin 0.3 - 1.2 mg/dL - - -  Alkaline Phos 38 - 126 U/L - - -  AST 15 - 41 U/L - - -  ALT 0 - 44 U/L - - -   Imaging:  CT Abdomen and Pelvis without Contrast (05/27/2018) Acute perforated sigmoid diverticulitis with adjacent 2.5 cm air and fluid-filled collection within the left pelvis.  CT Abdomen and Pelvis with Contrast (05/30/2018) - personally reviewed and discussed with patient and her husband The stomach is non-distended. The small bowel loops are normal in course and caliber. The proximal colon is unremarkable. Extensive distal colonic diverticula identified. Wall thickening involving the sigmoid colon is again noted within the left side of pelvis compatible with diverticulitis. Small contained fluid collection is again identified with air-fluid level in the left hemipelvis. This measures 2.7 by 2.4 by 2.2 cm. Previously this measured 2.5 x 2.4 by 0.8 cm. No new fluid collections identified.  CT Abdomen and Pelvis with Contrast (06/20/2018) - personally reviewed and discussed with patient and her husband 1. Decreased size of focal collection within the LEFT LOWER pelvis, now measuring 1.5 cm, previously 3.2 cm. No pneumoperitoneum, acute inflammation or new/enlarging collection/abscess. 2. Colonic diverticulosis. 3.  Aortic Atherosclerosis   Assessment:  62 y.o. yo  Female with a problem list including...  Patient Active Problem List   Diagnosis Date Noted  . Diverticulitis of large intestine with abscess 05/27/2018  . Diabetes mellitus without complication ([place]) 13/03/6577  . Lateral epicondylitis 07/07/2017  . Personal history of tobacco use, presenting hazards to health 05/03/2016  . Bronchitis, chronic ([place]) 03/03/2015  . Depression, major, recurrent, moderate ([place]) 11/05/2014  . Anxiety, generalized 11/05/2014  . Alcohol abuse, in remission 11/05/2014  . Nicotine addiction 11/05/2014  . H/O: osteoarthritis 11/05/2014  . Fibromyalgia 11/05/2014  . Restless leg syndrome 11/05/2014  . Arthralgia of hip 01/13/2014  . Adiposity 01/13/2014  . Disordered sleep 01/13/2014  . Airway hyperreactivity 12/17/2013  . Adult hypothyroidism 12/17/2013  . Compulsive tobacco user syndrome 12/17/2013  . Current tobacco use 12/17/2013  . Basal cell carcinoma 11/04/2013  . Arthropathia 02/08/2012  . Arthritis of knee, left 02/08/2012    presents to clinic for follow-up evaluation of sigmoid-colonic diverticulitis-associated LLQ abdominal pain and what clinically sounds like persistent/recurrent diverticulitis with associated peri-colonic abscess, complicated by comorbidities including obesity (BMI >31), inconsistently controlled DM, HTN, COPD (previously on oral systemic steroids), thyroid disease (not otherwise specified), chronic ongoing tobacco abuse (smoking), and generalized anxiety disorder.  Plan:   - natural history of diverticulosis/diverticulitis  discussed  - despite multiple recent CT's, suspect persistent diverticulitis with abscess, will check CT + labs accordingly  - if abscess or worsened diverticulitis, will likely refer to [facility] for admission, IV antibiotics, and possible re-drainage  - discussed with patient that she will likely undergo sigmoid colectomy for her diverticular disease, preferably following resolution of acute inflammation and  subsequent colonscopy, but possibly sooner with colostomy if not improving  - patient should not leave radiology following CT until decision regarding antibiotics vs admission made  - instructed to call office if any questions or concerns  - will update Dr. Hampton Abbot accordingly  All of the above recommendations were discussed with the patient and patient's husband, and all of patient's and family's questions were answered to their expressed satisfaction.  -- Marilynne Drivers Rosana Hoes, MD, [place]: [facility] Partnering for exceptional care. Office: [phone]

## 2018-07-05 NOTE — H&P (Signed)
Date of Admission:  07/05/2018  Reason for Admission:  Diverticulitis with abscess  History of Present Illness: Linda Bradford is a 62 y.o. female with a history of sigmoid diverticulitis with small abscess, which was able to be drained by IR on 10/31.  The patient had troubles with flushing the drain due to pain each time the drain would flush, and the flush was decreased from 10 ml to 5 ml.  The drain was removed on 11/12 as it had low volume output.  She did well until 11/20 when she called the office complaining of worsening pain again.  Her labs were unremarkable, as her WBC was 8.0, but her CT scan did show a small 1.5 cm abscess in the same location as prior.  She was started on antibiotic course again and she improved, until 12/1, when she started having worsening pain again.  She reports having sweats at night, worsening left lower quadrant pain in the same location as before, and nausea, but no emesis.  She presented to the clinic today and was seen by Dr. Rosana Hoes, who ordered labs and a repeat CT scan.  I have independently seen the imaging study and reviewed her labwork.  She does have a larger abscess, with abscess tracking via fistulous tract along the prior tract that the drain made.  There is also diverticulitis but no free air.  Past Medical History: Past Medical History:  Diagnosis Date  . Anxiety   . COPD (chronic obstructive pulmonary disease) ([place])   . Depression   . Diabetes mellitus without complication ([place])   . Fatigue   . Headache   . Hypertension   . Thyroid disease      Past Surgical History: Past Surgical History:  Procedure Laterality Date  . APPENDECTOMY    . TUBAL LIGATION      Home Medications: Prior to Admission medications   Medication Sig Start Date End Date Taking? Authorizing Provider  acetaminophen (TYLENOL) 500 MG tablet Take 1,000 mg by mouth every 6 (six) hours as needed for mild pain or moderate pain.    Yes [provider]  albuterol  (PROVENTIL HFA;VENTOLIN HFA) 108 (90 Base) MCG/ACT inhaler Inhale 1 puff into the lungs every 4 (four) hours as needed. 08/04/17  Yes Mar Daring, PA-C  benzonatate (TESSALON) 200 MG capsule Take 1 capsule (200 mg total) by mouth 3 (three) times daily as needed for cough. 04/17/18  Yes Burnette, Jennifer M, PA-C  BREO ELLIPTA 200-25 MCG/INH AEPB INHALE 1 PUFF BY MOUTH ONCE DAILY Patient taking differently: Inhale 1 puff into the lungs daily.  01/25/18  Yes Burnette, Clearnce Sorrel, PA-C  clonazePAM (KLONOPIN) 0.5 MG tablet TAKE 1/2 TABLET BY MOUTH TWICE DAILY AS NEEDED 02/23/18  Yes Fenton Malling M, PA-C  Cyanocobalamin (RA VITAMIN B-12 TR) 1000 MCG TBCR Take 1,000 mcg by mouth daily.    Yes [provider]  diclofenac sodium (VOLTAREN) 1 % GEL Apply 4 g topically 4 (four) times daily as needed. 03/01/18  Yes Mar Daring, PA-C  DULoxetine (CYMBALTA) 60 MG capsule TAKE 1 CAPSULE BY MOUTH TWICE DAILY 02/23/18  Yes Mar Daring, PA-C  ergocalciferol (VITAMIN D2) 1.25 MG (50000 UT) capsule once a week.    Yes [provider]  ezetimibe (ZETIA) 10 MG tablet TAKE 1 TABLET BY MOUTH ONCE DAILY 06/20/18  Yes Fenton Malling M, PA-C  gabapentin (NEURONTIN) 300 MG capsule Take 300-1,200 mg by mouth See admin instructions. Take 1 capsule (300MG ) by mouth every  morning and 4 capsules (1200MG ) by mouth every night at bedtime   Yes [provider]  glucose blood (CONTOUR NEXT TEST) test strip To check blood sugar once daily 06/09/17  Yes Burnette, Jennifer M, PA-C  HYDROcodone-acetaminophen (NORCO/VICODIN) 5-325 MG tablet Take 1 tablet by mouth every 6 (six) hours as needed for moderate pain. 06/27/18  Yes Minor Iden, MD  ipratropium-albuterol (DUONEB) 0.5-2.5 (3) MG/3ML SOLN Take 3 mLs by nebulization every 6 (six) hours as needed. 11/04/15  Yes Burnette, Clearnce Sorrel, PA-C  levothyroxine (SYNTHROID, LEVOTHROID) 75 MCG tablet TAKE 1 TABLET BY MOUTH ONCE DAILY ON AN  EMPTY STOMACH. WAIT 30 MINUTES BEFORE TAKING OTHER MEDS. Patient taking differently: Take 75 mcg by mouth daily.  02/23/18  Yes Mar Daring, PA-C  losartan (COZAAR) 50 MG tablet TAKE 1 TABLET BY MOUTH ONCE DAILY 10/26/17  Yes Mar Daring, PA-C  meclizine (ANTIVERT) 25 MG tablet Take 1 tablet (25 mg total) by mouth 3 (three) times daily as needed for dizziness. 05/15/18  Yes Burnette, Clearnce Sorrel, PA-C  meloxicam (MOBIC) 15 MG tablet TAKE 1 TABLET BY MOUTH ONCE DAILY. 03/28/18  Yes Fenton Malling M, PA-C  metFORMIN (GLUCOPHAGE-XR) 500 MG 24 hr tablet TAKE 2 TABLETS BY MOUTH ONCE DAILY WITH SUPPER 03/01/18  Yes Fenton Malling M, PA-C  pyridOXINE (VITAMIN B-6) 100 MG tablet Take 100 mg by mouth daily.   Yes [provider]  THEO-24 300 MG 24 hr capsule TAKE 1 CAPSULE BY MOUTH ONCE DAILY 06/11/18  Yes Burnette, Anderson [place] M, PA-C  tiZANidine (ZANAFLEX) 4 MG capsule TAKE 1 CAPSULE BY MOUTH 3 TIMES DAILY Patient taking differently: Take 4 mg by mouth at bedtime as needed for muscle spasms.  02/19/18  Yes Burnette, Clearnce Sorrel, PA-C  nicotine (NICODERM CQ - DOSED IN MG/24 HOURS) 21 mg/24hr patch Place 1 patch (21 mg total) onto the skin daily. Patient not taking: Reported on 07/05/2018 06/02/18   Tylene Fantasia, PA-C  polyethylene glycol [facility] / Floria Raveling) packet Take 17 g by mouth daily. Patient not taking: Reported on 07/05/2018 06/02/18   Tylene Fantasia, PA-C  Vitamin D, Ergocalciferol, (DRISDOL) 50000 units CAPS capsule TAKE 1 CAPSULE BY MOUTH EVERY 7 DAYS Patient taking differently: Take 50,000 Units by mouth every Tuesday.  12/01/17   Mar Daring, PA-C    Allergies: Allergies  Allergen Reactions  . Dexilant [Dexlansoprazole] Nausea Only and Other (See Comments)    Dizziness    Social History:  reports that she has been smoking cigarettes. She started smoking about 38 years ago. She has a 45.00 pack-year smoking history. She has never used smokeless  tobacco. She reports that she does not drink alcohol or use drugs.   Family History: Family History  Problem Relation Age of Onset  . Lung cancer Mother   . Alcohol abuse Sister   . Arthritis Sister   . Alcohol abuse Brother   . Heart disease Brother 62  . Heart attack Brother   . Pancreatic cancer Maternal Aunt   . Diabetes Maternal Aunt   . Alcohol abuse Son   . Drug abuse Son   . Drug abuse Son   . Heart disease Father 75  . Diabetes Sister   . Breast cancer Brother   . Alcohol abuse Maternal Uncle   . Alcohol abuse Maternal Grandmother   . Diabetes Maternal Grandmother     Review of Systems: Review of Systems  Constitutional: Negative for chills and fever.  HENT: Negative for  hearing loss.   Respiratory: Negative for shortness of breath.   Cardiovascular: Negative for chest pain.  Gastrointestinal: Positive for abdominal pain and nausea. Negative for constipation, diarrhea and vomiting.  Genitourinary: Negative for dysuria.  Musculoskeletal: Negative for myalgias.  Skin: Negative for rash.  Neurological: Negative for dizziness.  Psychiatric/Behavioral: Negative for depression.    Physical Exam BP (!) 148/62 (BP Location: Left Arm)   Pulse 88   Temp 97.8 F (36.6 C) (Oral)   Resp 18   Ht 5\' 9"  (1.753 m)   Wt 95.7 kg   SpO2 96%   BMI 31.16 kg/m  CONSTITUTIONAL: No acute distress HEENT:  Normocephalic, atraumatic, extraocular motion intact. NECK: Trachea is midline, and there is no jugular venous distension.  RESPIRATORY:  Lungs are clear, and breath sounds are equal bilaterally. Normal respiratory effort without pathologic use of accessory muscles. CARDIOVASCULAR: Heart is regular without murmurs, gallops, or rubs. GI: The abdomen is soft, obese, nondistended, with tenderness to palpation in the left lower quadrant.  No drainage from the prior drain site, which is well healed. There were no palpable masses.  MUSCULOSKELETAL:  Normal muscle strength and tone in  all four extremities.  No peripheral edema or cyanosis. SKIN: Skin turgor is normal. There are no pathologic skin lesions.  NEUROLOGIC:  Motor and sensation is grossly normal.  Cranial nerves are grossly intact. PSYCH:  Alert and oriented to person, place and time. Affect is normal.  Laboratory Analysis: Results for orders placed or performed during the hospital encounter of 07/05/18 (from the past 24 hour(s))  Glucose, capillary     Status: Abnormal   Collection Time: 07/05/18  4:28 PM  Result Value Ref Range   Glucose-Capillary 111 (H) 70 - 99 mg/dL    Imaging: Ct Abdomen Pelvis W Contrast  Result Date: 07/05/2018 CLINICAL DATA:  Worsening left lower quadrant abdominal pain along with nausea and diarrhea. History of diverticular abscess. EXAM: CT ABDOMEN AND PELVIS WITH CONTRAST TECHNIQUE: Multidetector CT imaging of the abdomen and pelvis was performed using the standard protocol following bolus administration of intravenous contrast. CONTRAST:  123mL ISOVUE-300 IOPAMIDOL (ISOVUE-300) INJECTION 61% COMPARISON:  Multiple recent abdominal CT scans. The most recent is 06/20/2018 FINDINGS: Lower chest: Streaky right basilar atelectasis but no infiltrates or effusions. The heart is normal in size. No pericardial effusion. Hepatobiliary: No focal hepatic lesions or intrahepatic biliary dilatation. The gallbladder is normal. No common bile duct dilatation. Pancreas: No mass, inflammation or ductal dilatation. Spleen: Normal size.  No focal lesions.  Stable calcified granuloma. Adrenals/Urinary Tract: The adrenal glands and kidneys are unremarkable and stable. No worrisome renal lesions, hydronephrosis or renal calculi. The bladder is unremarkable. Stomach/Bowel: The stomach, duodenum and small bowel are unremarkable. No acute inflammatory changes, mass lesions or obstructive findings. The terminal ileum is normal. The appendix is surgically absent. The ascending, transverse and descending colons appear  unremarkable. There is moderate scattered colonic diverticulosis but no mass or obstruction. Recurrent sigmoid colon diverticulitis with a large trident shaped abscess measuring a maximum of 5.8 x 4.2 cm. No free air. Severe underlying sigmoid colon diverticulosis. There is a new fistulous tract extending from the sigmoid colon to the left rectus muscle. This is approximately 8 cm in length. Vascular/Lymphatic: Stable atherosclerotic calcifications involving the aorta but no aneurysm or dissection. The branch vessels are patent. The major venous structures are patent. No mesenteric or retroperitoneal mass or adenopathy. Small scattered lymph nodes are noted. Reproductive: Unremarkable and stable. Other: No free pelvic  fluid collections. Scattered pelvic lymph nodes are likely inflammatory. No inguinal mass or adenopathy. Musculoskeletal: No significant bony findings. IMPRESSION: 1. Recurrent diverticulitis and a large trident shaped diverticular abscess. 2. New fistulous tract extending from the sigmoid colon to the left rectus muscle. 3. Stable diffuse colonic diverticulosis. These results will be called to the ordering clinician or representative by the Radiologist Assistant, and communication documented in the PACS or zVision Dashboard. Electronically Signed   By: Marijo Sanes M.D.   On: 07/05/2018 14:08    Assessment and Plan: This is a 62 y.o. female with acute diverticulitis with recurrent abscess.  Discussed with radiology and the abscess is drainable.  Will plan for IR drainage tomorrow.  Will keep the patient NPO and start on IV antibiotics.  Will have appropriate pain and nausea control.  Will have IV fluids to keep hydrated.  Hold DVT proph for now until drain is placed tomorrow.  Discussed with the patient that we'll attempt conservative management with drain placement for this episode.  However, if drain not feasible or there is recurrence later, then the next plan of action would be to go to  OR for resection and likely colostomy.  Patient understands this and is in agreement.   Melvyn Neth, MD [facility]:  [phone]

## 2018-07-05 NOTE — Progress Notes (Signed)
Pt prefers to only wear the SCD's at night.

## 2018-07-05 NOTE — Patient Instructions (Addendum)
Patient will need to have a Ct Scan of the abdomen/pelvis with contrast and lab work (CBC).  Call the office with any questions or concerns.

## 2018-07-05 NOTE — Telephone Encounter (Signed)
Patient coming in today to see Dr Rosana Hoes.

## 2018-07-05 NOTE — Progress Notes (Signed)
Patient has been scheduled for a CT abdomen/pelvis with contrast at [facility] for today, 07-05-18 at 2 pm (arrive 1:45 pm). Prep: nothing else to eat or drink and pick up prep kit. Patient verbalizes understanding.  The patient will be held at [facility] until Dr. Davis has been contacted with the results.   The patient will have the following labs drawn today at [facility]: CBC and BMP.    

## 2018-07-05 NOTE — Telephone Encounter (Signed)
Patient stated that she is still a lot of pain, it started yesterday and she is taking her pain medication but its only lasting for an hour then the pain comes back. I offered her to come in to see Dr.Piscoya tomorrow but patient didn't think she could wait that long.

## 2018-07-06 ENCOUNTER — Encounter: Payer: Self-pay | Admitting: Emergency Medicine

## 2018-07-06 ENCOUNTER — Inpatient Hospital Stay: Payer: BLUE CROSS/BLUE SHIELD

## 2018-07-06 ENCOUNTER — Ambulatory Visit: Payer: BC Managed Care – PPO

## 2018-07-06 LAB — GLUCOSE, CAPILLARY
Glucose-Capillary: 110 mg/dL — ABNORMAL HIGH (ref 70–99)
Glucose-Capillary: 112 mg/dL — ABNORMAL HIGH (ref 70–99)
Glucose-Capillary: 126 mg/dL — ABNORMAL HIGH (ref 70–99)
Glucose-Capillary: 89 mg/dL (ref 70–99)
Glucose-Capillary: 91 mg/dL (ref 70–99)

## 2018-07-06 LAB — MAGNESIUM: Magnesium: 1.9 mg/dL (ref 1.7–2.4)

## 2018-07-06 LAB — CBC WITH DIFFERENTIAL/PLATELET
Abs Immature Granulocytes: 0.05 10*3/uL (ref 0.00–0.07)
Basophils Absolute: 0 10*3/uL (ref 0.0–0.1)
Basophils Relative: 0 %
Eosinophils Absolute: 0.2 10*3/uL (ref 0.0–0.5)
Eosinophils Relative: 2 %
HCT: 34.7 % — ABNORMAL LOW (ref 36.0–46.0)
Hemoglobin: 11.2 g/dL — ABNORMAL LOW (ref 12.0–15.0)
Immature Granulocytes: 1 %
Lymphocytes Relative: 16 %
Lymphs Abs: 1.4 10*3/uL (ref 0.7–4.0)
MCH: 30.1 pg (ref 26.0–34.0)
MCHC: 32.3 g/dL (ref 30.0–36.0)
MCV: 93.3 fL (ref 80.0–100.0)
Monocytes Absolute: 0.8 10*3/uL (ref 0.1–1.0)
Monocytes Relative: 9 %
Neutro Abs: 6.3 10*3/uL (ref 1.7–7.7)
Neutrophils Relative %: 72 %
Platelets: 344 10*3/uL (ref 150–400)
RBC: 3.72 MIL/uL — ABNORMAL LOW (ref 3.87–5.11)
RDW: 13.1 % (ref 11.5–15.5)
WBC: 8.7 10*3/uL (ref 4.0–10.5)
nRBC: 0 % (ref 0.0–0.2)

## 2018-07-06 LAB — BASIC METABOLIC PANEL
Anion gap: 7 (ref 5–15)
BUN: 9 mg/dL (ref 8–23)
CO2: 26 mmol/L (ref 22–32)
Calcium: 8.1 mg/dL — ABNORMAL LOW (ref 8.9–10.3)
Chloride: 106 mmol/L (ref 98–111)
Creatinine, Ser: 0.62 mg/dL (ref 0.44–1.00)
GFR calc Af Amer: >60 mL/min (ref >60)
GFR calc non Af Amer: >60 mL/min (ref >60)
Glucose, Bld: 136 mg/dL — ABNORMAL HIGH (ref 70–99)
Potassium: 3.5 mmol/L (ref 3.5–5.1)
Sodium: 139 mmol/L (ref 135–145)

## 2018-07-06 MED ORDER — FENTANYL CITRATE (PF) 100 MCG/2ML IJ SOLN
INTRAMUSCULAR | Status: AC | PRN
  Administered 2018-07-06 (×2): 50 ug via INTRAVENOUS

## 2018-07-06 MED ORDER — MIDAZOLAM HCL 5 MG/5ML IJ SOLN
INTRAMUSCULAR | Status: AC
  Filled 2018-07-06: qty 10

## 2018-07-06 MED ORDER — FENTANYL CITRATE (PF) 100 MCG/2ML IJ SOLN
INTRAMUSCULAR | Status: AC
  Filled 2018-07-06: qty 4

## 2018-07-06 MED ORDER — ACETAMINOPHEN 500 MG PO TABS
1000.0000 mg | ORAL_TABLET | Freq: Four times a day (QID) | ORAL | Status: DC | PRN
  Administered 2018-07-06 – 2018-07-07 (×3): 1000 mg via ORAL
  Filled 2018-07-06 (×3): qty 2

## 2018-07-06 MED ORDER — MIDAZOLAM HCL 5 MG/5ML IJ SOLN
INTRAMUSCULAR | Status: AC | PRN
  Administered 2018-07-06 (×2): 1 mg via INTRAVENOUS

## 2018-07-06 MED ORDER — SODIUM CHLORIDE 0.9% FLUSH
5.0000 mL | Freq: Three times a day (TID) | INTRAVENOUS | Status: DC
  Administered 2018-07-06 – 2018-07-10 (×11): 5 mL

## 2018-07-06 NOTE — Procedures (Signed)
L pelvic 10 Fr abscess drain 15 cc pus EBL 0 Comp 0

## 2018-07-06 NOTE — Consult Note (Signed)
Chief Complaint: Patient was seen in consultation today for No chief complaint on file.  at the request of Vickie Epley  Referring Physician(s): Vickie Epley  Supervising Physician: Marybelle Killings  Patient Status: [facility] - In-pt  History of Present Illness: Linda Bradford is a 62 y.o. female who had diverticulitis in October associated with a pelvic abscess.  A percutaneous drain was placed and was removed.  The abscess has unfortunately recurred.  She did have some fevers and chills prior to admission.  She feels well today other than pelvic pain.  Past Medical History:  Diagnosis Date  . Anxiety   . COPD (chronic obstructive pulmonary disease) ([place])   . Depression   . Diabetes mellitus without complication ([place])   . Fatigue   . Headache   . Hypertension   . Thyroid disease     Past Surgical History:  Procedure Laterality Date  . APPENDECTOMY    . TUBAL LIGATION      Allergies: Dexilant [dexlansoprazole]  Medications: Prior to Admission medications   Medication Sig Start Date End Date Taking? Authorizing Provider  acetaminophen (TYLENOL) 500 MG tablet Take 1,000 mg by mouth every 6 (six) hours as needed for mild pain or moderate pain.    Yes [provider]  albuterol (PROVENTIL HFA;VENTOLIN HFA) 108 (90 Base) MCG/ACT inhaler Inhale 1 puff into the lungs every 4 (four) hours as needed. 08/04/17  Yes Mar Daring, PA-C  benzonatate (TESSALON) 200 MG capsule Take 1 capsule (200 mg total) by mouth 3 (three) times daily as needed for cough. 04/17/18  Yes Burnette, Jennifer M, PA-C  BREO ELLIPTA 200-25 MCG/INH AEPB INHALE 1 PUFF BY MOUTH ONCE DAILY Patient taking differently: Inhale 1 puff into the lungs daily.  01/25/18  Yes Burnette, Clearnce Sorrel, PA-C  clonazePAM (KLONOPIN) 0.5 MG tablet TAKE 1/2 TABLET BY MOUTH TWICE DAILY AS NEEDED 02/23/18  Yes Fenton Malling M, PA-C  Cyanocobalamin ([place] VITAMIN B-12 TR) 1000 MCG TBCR Take 1,000 mcg by mouth daily.     Yes [provider]  diclofenac sodium (VOLTAREN) 1 % GEL Apply 4 g topically 4 (four) times daily as needed. 03/01/18  Yes Mar Daring, PA-C  DULoxetine (CYMBALTA) 60 MG capsule TAKE 1 CAPSULE BY MOUTH TWICE DAILY 02/23/18  Yes Mar Daring, PA-C  ergocalciferol (VITAMIN D2) 1.25 MG (50000 UT) capsule once a week.    Yes [provider]  ezetimibe (ZETIA) 10 MG tablet TAKE 1 TABLET BY MOUTH ONCE DAILY 06/20/18  Yes Fenton Malling M, PA-C  gabapentin (NEURONTIN) 300 MG capsule Take 300-1,200 mg by mouth See admin instructions. Take 1 capsule (300MG ) by mouth every morning and 4 capsules (1200MG ) by mouth every night at bedtime   Yes [provider]  glucose blood (CONTOUR NEXT TEST) test strip To check blood sugar once daily 06/09/17  Yes Burnette, Jennifer M, PA-C  HYDROcodone-acetaminophen (NORCO/VICODIN) 5-325 MG tablet Take 1 tablet by mouth every 6 (six) hours as needed for moderate pain. 06/27/18  Yes Piscoya, Jose, MD  ipratropium-albuterol (DUONEB) 0.5-2.5 (3) MG/3ML SOLN Take 3 mLs by nebulization every 6 (six) hours as needed. 11/04/15  Yes Burnette, Clearnce Sorrel, PA-C  levothyroxine (SYNTHROID, LEVOTHROID) 75 MCG tablet TAKE 1 TABLET BY MOUTH ONCE DAILY ON AN EMPTY STOMACH. WAIT 30 MINUTES BEFORE TAKING OTHER MEDS. Patient taking differently: Take 75 mcg by mouth daily.  02/23/18  Yes Fenton Malling M, PA-C  losartan (COZAAR) 50 MG tablet TAKE 1 TABLET BY MOUTH ONCE DAILY  10/26/17  Yes Mar Daring, PA-C  meclizine (ANTIVERT) 25 MG tablet Take 1 tablet (25 mg total) by mouth 3 (three) times daily as needed for dizziness. 05/15/18  Yes Burnette, Clearnce Sorrel, PA-C  meloxicam (MOBIC) 15 MG tablet TAKE 1 TABLET BY MOUTH ONCE DAILY. 03/28/18  Yes Fenton Malling M, PA-C  metFORMIN (GLUCOPHAGE-XR) 500 MG 24 hr tablet TAKE 2 TABLETS BY MOUTH ONCE DAILY WITH SUPPER 03/01/18  Yes Fenton Malling M, PA-C  pyridOXINE (VITAMIN B-6) 100 MG tablet Take  100 mg by mouth daily.   Yes [provider]  THEO-24 300 MG 24 hr capsule TAKE 1 CAPSULE BY MOUTH ONCE DAILY 06/11/18  Yes Burnette, Anderson Malta M, PA-C  tiZANidine (ZANAFLEX) 4 MG capsule TAKE 1 CAPSULE BY MOUTH 3 TIMES DAILY Patient taking differently: Take 4 mg by mouth at bedtime as needed for muscle spasms.  02/19/18  Yes Burnette, Clearnce Sorrel, PA-C  nicotine (NICODERM CQ - DOSED IN MG/24 HOURS) 21 mg/24hr patch Place 1 patch (21 mg total) onto the skin daily. Patient not taking: Reported on 07/05/2018 06/02/18   Tylene Fantasia, PA-C  polyethylene glycol [facility] / Floria Raveling) packet Take 17 g by mouth daily. Patient not taking: Reported on 07/05/2018 06/02/18   Tylene Fantasia, PA-C  Vitamin D, Ergocalciferol, (DRISDOL) 50000 units CAPS capsule TAKE 1 CAPSULE BY MOUTH EVERY 7 DAYS Patient taking differently: Take 50,000 Units by mouth every Tuesday.  12/01/17   Mar Daring, PA-C     Family History  Problem Relation Age of Onset  . Lung cancer Mother   . Alcohol abuse Sister   . Arthritis Sister   . Alcohol abuse Brother   . Heart disease Brother 74  . Heart attack Brother   . Pancreatic cancer Maternal Aunt   . Diabetes Maternal Aunt   . Alcohol abuse Son   . Drug abuse Son   . Drug abuse Son   . Heart disease Father 74  . Diabetes Sister   . Breast cancer Brother   . Alcohol abuse Maternal Uncle   . Alcohol abuse Maternal Grandmother   . Diabetes Maternal Grandmother     Social History   Socioeconomic History  . Marital status: Married    Spouse name: Not on file  . Number of children: Not on file  . Years of education: Not on file  . Highest education level: Not on file  Occupational History  . Not on file  Social Needs  . Financial resource strain: Not on file  . Food insecurity:    Worry: Not on file    Inability: Not on file  . Transportation needs:    Medical: Not on file    Non-medical: Not on file  Tobacco Use  . Smoking status: Current  Every Day Smoker    Packs/day: 1.00    Years: 45.00    Pack years: 45.00    Types: Cigarettes    Start date: 03/02/1980  . Smokeless tobacco: Never Used  . Tobacco comment: every other day  Substance and Sexual Activity  . Alcohol use: No    Alcohol/week: 0.0 standard drinks  . Drug use: No  . Sexual activity: Yes  Lifestyle  . Physical activity:    Days per week: Not on file    Minutes per session: Not on file  . Stress: Not on file  Relationships  . Social connections:    Talks on phone: Not on file    Gets together: Not  on file    Attends religious service: Not on file    Active member of club or organization: Not on file    Attends meetings of clubs or organizations: Not on file    Relationship status: Not on file  Other Topics Concern  . Not on file  Social History Narrative  . Not on file     Review of Systems: A 12 point ROS discussed and pertinent positives are indicated in the HPI above.  All other systems are negative.  Review of Systems  Vital Signs: BP 139/65   Pulse 79   Temp 97.7 F (36.5 C) (Oral)   Resp 15   Ht 5\' 9"  (1.753 m)   Wt 95.7 kg   SpO2 93%   BMI 31.16 kg/m   Physical Exam  Constitutional: She is oriented to person, place, and time. She appears well-developed and well-nourished.  HENT:  Head: Normocephalic and atraumatic.  Cardiovascular: Normal rate and regular rhythm.  Pulmonary/Chest: Effort normal and breath sounds normal.  Neurological: She is alert and oriented to person, place, and time.    Imaging: Ct Abdomen Pelvis W Contrast  Result Date: 07/05/2018 CLINICAL DATA:  Worsening left lower quadrant abdominal pain along with nausea and diarrhea. History of diverticular abscess. EXAM: CT ABDOMEN AND PELVIS WITH CONTRAST TECHNIQUE: Multidetector CT imaging of the abdomen and pelvis was performed using the standard protocol following bolus administration of intravenous contrast. CONTRAST:  148mL ISOVUE-300 IOPAMIDOL (ISOVUE-300)  INJECTION 61% COMPARISON:  Multiple recent abdominal CT scans. The most recent is 06/20/2018 FINDINGS: Lower chest: Streaky right basilar atelectasis but no infiltrates or effusions. The heart is normal in size. No pericardial effusion. Hepatobiliary: No focal hepatic lesions or intrahepatic biliary dilatation. The gallbladder is normal. No common bile duct dilatation. Pancreas: No mass, inflammation or ductal dilatation. Spleen: Normal size.  No focal lesions.  Stable calcified granuloma. Adrenals/Urinary Tract: The adrenal glands and kidneys are unremarkable and stable. No worrisome renal lesions, hydronephrosis or renal calculi. The bladder is unremarkable. Stomach/Bowel: The stomach, duodenum and small bowel are unremarkable. No acute inflammatory changes, mass lesions or obstructive findings. The terminal ileum is normal. The appendix is surgically absent. The ascending, transverse and descending colons appear unremarkable. There is moderate scattered colonic diverticulosis but no mass or obstruction. Recurrent sigmoid colon diverticulitis with a large trident shaped abscess measuring a maximum of 5.8 x 4.2 cm. No free air. Severe underlying sigmoid colon diverticulosis. There is a new fistulous tract extending from the sigmoid colon to the left rectus muscle. This is approximately 8 cm in length. Vascular/Lymphatic: Stable atherosclerotic calcifications involving the aorta but no aneurysm or dissection. The branch vessels are patent. The major venous structures are patent. No mesenteric or retroperitoneal mass or adenopathy. Small scattered lymph nodes are noted. Reproductive: Unremarkable and stable. Other: No free pelvic fluid collections. Scattered pelvic lymph nodes are likely inflammatory. No inguinal mass or adenopathy. Musculoskeletal: No significant bony findings. IMPRESSION: 1. Recurrent diverticulitis and a large trident shaped diverticular abscess. 2. New fistulous tract extending from the sigmoid  colon to the left rectus muscle. 3. Stable diffuse colonic diverticulosis. These results will be called to the ordering clinician or representative by the Radiologist Assistant, and communication documented in the PACS or zVision Dashboard. Electronically Signed   By: Marijo Sanes M.D.   On: 07/05/2018 14:08   Ct Abdomen Pelvis W Contrast  Result Date: 06/20/2018 CLINICAL DATA:  62 year old female with LEFT abdominal and pelvic pain and follow-up recent  diverticular abscess and drainage. EXAM: CT ABDOMEN AND PELVIS WITH CONTRAST TECHNIQUE: Multidetector CT imaging of the abdomen and pelvis was performed using the standard protocol following bolus administration of intravenous contrast. CONTRAST:  169mL ISOVUE-300 IOPAMIDOL (ISOVUE-300) INJECTION 61% COMPARISON:  05/31/2018 and prior CTs FINDINGS: Lower chest: Minimal RIGHT basilar atelectasis again noted. Hepatobiliary: The liver and gallbladder are unremarkable. No biliary dilatation. Pancreas: Unremarkable Spleen: Unremarkable Adrenals/Urinary Tract: The kidneys, adrenal glands and bladder are unremarkable. Stomach/Bowel: Colonic diverticulosis is again identified. A 1.5 cm LEFT LOWER pelvic collection without gas now measures 1.5 cm, previously 3.2 cm. No bowel obstruction, definite bowel wall thickening or acute inflammation identified. Vascular/Lymphatic: Aortic atherosclerosis. No enlarged abdominal or pelvic lymph nodes. Reproductive: Uterus and adnexal regions are otherwise unchanged. Other: No evidence of ascites or pneumoperitoneum. Musculoskeletal: No acute or suspicious bony abnormalities. IMPRESSION: 1. Decreased size of focal collection within the LEFT LOWER pelvis, now measuring 1.5 cm, previously 3.2 cm. No pneumoperitoneum, acute inflammation or new/enlarging collection/abscess. 2. Colonic diverticulosis. 3.  Aortic Atherosclerosis (ICD10-I70.0). Electronically Signed   By: Margarette Canada M.D.   On: 06/20/2018 12:09    Labs:  CBC: Recent  Labs    06/01/18 0320 06/20/18 1027 07/05/18 1112 07/06/18 0432  WBC 8.2 8.0 12.6* 8.7  HGB 12.9 14.3 11.7* 11.2*  HCT 40.3 43.8 35.9* 34.7*  PLT 299 330 380 344    COAGS: Recent Labs    05/31/18 0301  INR 1.04    BMP: Recent Labs    05/27/18 0531 05/28/18 0452 06/20/18 1027 07/05/18 1112 07/06/18 0432  NA 138 136  --  138 139  K 4.5 3.7  --  4.2 3.5  CL 106 104  --  101 106  CO2 27 25  --  26 26  GLUCOSE 197* 141*  --  145* 136*  BUN 19 12  --  14 9  CALCIUM 8.7* 8.3*  --  9.2 8.1*  CREATININE 0.65 0.56 0.71 0.49 0.62  GFRNONAA >60 >60 >60 >60 >60  GFRAA >60 >60 >60 >60 >60    LIVER FUNCTION TESTS: Recent Labs    05/26/18 2143  BILITOT 0.6  AST 19  ALT 20  ALKPHOS 90  PROT 7.5  ALBUMIN 4.2    TUMOR MARKERS: No results for input(s): AFPTM, CEA, CA199, CHROMGRNA in the last 8760 hours.  Assessment and Plan:  Recurrent left pelvic abscess.  Percutaneous drain to follow.  Thank you for this interesting consult.  I greatly enjoyed meeting Linda Bradford and look forward to participating in their care.  A copy of this report was sent to the requesting provider on this date.  Electronically Signed: Art A Grover Robinson, MD 07/06/2018, 12:19 PM   I spent a total of 40 Minutes    in face to face in clinical consultation, greater than 50% of which was counseling/coordinating care for pelvic abscess drainage.

## 2018-07-06 NOTE — Progress Notes (Signed)
[facility] Progress Note     Subjective: No acute events overnight. Patient reports continued pain in her LLQ but she denied any fevers, chills, nausea, or emesis. She has been NPO in anticipation of IR procedure for abscess drainage today.    Objective: Vital signs in last 24 hours: Temp:  [97.3 F (36.3 C)-98.1 F (36.7 C)] 97.7 F (36.5 C) (12/06 0406) Pulse Rate:  [79-88] 86 (12/06 0406) Resp:  [18-20] 20 (12/06 0406) BP: (107-156)/(62-72) 156/72 (12/06 0406) SpO2:  [92 %-97 %] 92 % (12/06 0406) Weight:  [95.7 kg] 95.7 kg (12/05 1630) Last BM Date: 07/04/18  Intake/Output from previous day: 12/05 0701 - 12/06 0700 In: 1883.5 [I.V.:1732; IV Piggyback:151.6] Out: 1600 [Urine:1600] Intake/Output this shift: Total I/O In: 289.3 [I.V.:258; IV Piggyback:31.3] Out: -   PE: Gen:  Alert, NAD, pleasant Pulm:  Normal effort, Abd: Soft, tenderness in LLQ, non-distended. Previous drain site in LLQ is well healed Skin: warm and dry, no rashes  Psych: A&Ox3   Lab Results:  Recent Labs    07/05/18 1112 07/06/18 0432  WBC 12.6* 8.7  HGB 11.7* 11.2*  HCT 35.9* 34.7*  PLT 380 344   BMET Recent Labs    07/05/18 1112 07/06/18 0432  NA 138 139  K 4.2 3.5  CL 101 106  CO2 26 26  GLUCOSE 145* 136*  BUN 14 9  CREATININE 0.49 0.62  CALCIUM 9.2 8.1*   PT/INR No results for input(s): LABPROT, INR in the last 72 hours. CMP     Component Value Date/Time   NA 139 07/06/2018 0432   K 3.5 07/06/2018 0432   CL 106 07/06/2018 0432   CO2 26 07/06/2018 0432   GLUCOSE 136 (H) 07/06/2018 0432   BUN 9 07/06/2018 0432   CREATININE 0.62 07/06/2018 0432   CREATININE 0.58 05/19/2017 0823   CALCIUM 8.1 (L) 07/06/2018 0432   PROT 7.5 05/26/2018 2143   ALBUMIN 4.2 05/26/2018 2143   AST 19 05/26/2018 2143   ALT 20 05/26/2018 2143   ALKPHOS 90 05/26/2018 2143   BILITOT 0.6 05/26/2018 2143   GFRNONAA >60 07/06/2018 0432   GFRNONAA 99 05/19/2017 0823   GFRAA >60  07/06/2018 0432   GFRAA 115 05/19/2017 0823   Lipase  No results found for: LIPASE     Studies/Results: Ct Abdomen Pelvis W Contrast  Result Date: 07/05/2018 CLINICAL DATA:  Worsening left lower quadrant abdominal pain along with nausea and diarrhea. History of diverticular abscess. EXAM: CT ABDOMEN AND PELVIS WITH CONTRAST TECHNIQUE: Multidetector CT imaging of the abdomen and pelvis was performed using the standard protocol following bolus administration of intravenous contrast. CONTRAST:  162mL ISOVUE-300 IOPAMIDOL (ISOVUE-300) INJECTION 61% COMPARISON:  Multiple recent abdominal CT scans. The most recent is 06/20/2018 FINDINGS: Lower chest: Streaky right basilar atelectasis but no infiltrates or effusions. The heart is normal in size. No pericardial effusion. Hepatobiliary: No focal hepatic lesions or intrahepatic biliary dilatation. The gallbladder is normal. No common bile duct dilatation. Pancreas: No mass, inflammation or ductal dilatation. Spleen: Normal size.  No focal lesions.  Stable calcified granuloma. Adrenals/Urinary Tract: The adrenal glands and kidneys are unremarkable and stable. No worrisome renal lesions, hydronephrosis or renal calculi. The bladder is unremarkable. Stomach/Bowel: The stomach, duodenum and small bowel are unremarkable. No acute inflammatory changes, mass lesions or obstructive findings. The terminal ileum is normal. The appendix is surgically absent. The ascending, transverse and descending colons appear unremarkable. There is moderate scattered colonic diverticulosis but no mass or  obstruction. Recurrent sigmoid colon diverticulitis with a large trident shaped abscess measuring a maximum of 5.8 x 4.2 cm. No free air. Severe underlying sigmoid colon diverticulosis. There is a new fistulous tract extending from the sigmoid colon to the left rectus muscle. This is approximately 8 cm in length. Vascular/Lymphatic: Stable atherosclerotic calcifications involving the  aorta but no aneurysm or dissection. The branch vessels are patent. The major venous structures are patent. No mesenteric or retroperitoneal mass or adenopathy. Small scattered lymph nodes are noted. Reproductive: Unremarkable and stable. Other: No free pelvic fluid collections. Scattered pelvic lymph nodes are likely inflammatory. No inguinal mass or adenopathy. Musculoskeletal: No significant bony findings. IMPRESSION: 1. Recurrent diverticulitis and a large trident shaped diverticular abscess. 2. New fistulous tract extending from the sigmoid colon to the left rectus muscle. 3. Stable diffuse colonic diverticulosis. These results will be called to the ordering clinician or representative by the Radiologist Assistant, and communication documented in the PACS or zVision Dashboard. Electronically Signed   By: Marijo Sanes M.D.   On: 07/05/2018 14:08    Anti-infectives: Anti-infectives (From admission, onward)   Start     Dose/Rate Route Frequency Ordered Stop   07/05/18 1500  piperacillin-tazobactam (ZOSYN) IVPB 3.375 g     3.375 g 12.5 mL/hr over 240 Minutes Intravenous Every 8 hours 07/05/18 1458         Assessment/Plan  Diverticulitis with Recurrent Abscess Linda Bradford is a 62 y.o. female with diverticulitis with recurrent abscess which is complicated by pertinent comorbidities including anxiety, DM, HTN, hypothyroidism, COPD, obesity, and current tobacco abuse (smoking).     - NPO, IVF   - Pain control as needed   - Monitor abdominal examination and ongoing bowel function   - Plan for percutaneous drainage of abscess with IR today   - Will continue to try conservative management with drainage and IV Abx. Patient understands if this were to fail she may require surgical intervention and likely colostomy, which she understands   - Mobilize   - DVT prophylaxis.   -- Edison Simon , PA-C [facility] 07/06/2018, 8:41 AM [phone] M-F: 7am - 4pm

## 2018-07-06 NOTE — Care Management (Signed)
Patient admitted with diverticulitis with recurrent abscess.  Patient is from home with husband.  PCP Burnette.  Patient is independent of all ADL's.  No anticipate needs

## 2018-07-07 LAB — GLUCOSE, CAPILLARY
Glucose-Capillary: 119 mg/dL — ABNORMAL HIGH (ref 70–99)
Glucose-Capillary: 72 mg/dL (ref 70–99)
Glucose-Capillary: 87 mg/dL (ref 70–99)
Glucose-Capillary: 94 mg/dL (ref 70–99)
Glucose-Capillary: 94 mg/dL (ref 70–99)
Glucose-Capillary: 95 mg/dL (ref 70–99)

## 2018-07-07 NOTE — Progress Notes (Signed)
Subjective:  CC:  Linda Bradford is a 62 y.o. female  Hospital stay day 2,   recurrent diverticulitis, IR drain placement  HPI: No specfic issues overnight.  Liquid BMs and pain improved slightly  ROS:  A 5 point review of systems was performed and pertinent positives and negatives noted in HPI.   Objective:      Temp:  [97.4 F (36.3 C)-97.7 F (36.5 C)] 97.4 F (36.3 C) (12/07 2025) Pulse Rate:  [79-86] 79 (12/07 2025) Resp:  [17-20] 20 (12/07 2025) BP: (161-162)/(64-69) 161/68 (12/07 2025) SpO2:  [95 %-96 %] 96 % (12/07 2025)     Height: 5\' 9"  (175.3 cm) Weight: 95.7 kg BMI (Calculated): 31.14   Intake/Output this shift:   Intake/Output Summary (Last 24 hours) at 07/07/2018 2054 Last data filed at 07/07/2018 1635 Gross per 24 hour  Intake 2565.08 ml  Output 868 ml  Net 1697.08 ml        Constitutional :  alert, cooperative, appears stated age and no distress  Respiratory:  clear to auscultation bilaterally  Cardiovascular:  regular rate and rhythm  Gastrointestinal: Soft, no guarding but focal tenderness in LLQ and suprapubic region, drain with serosanguinous discharge.   Skin: Cool and moist.   Psychiatric: Normal affect, non-agitated, not confused       LABS:  CMP Latest Ref Rng & Units 07/06/2018 07/05/2018 06/20/2018  Glucose 70 - 99 mg/dL 136(H) 145(H) -  BUN 8 - 23 mg/dL 9 14 -  Creatinine 0.44 - 1.00 mg/dL 0.62 0.49 0.71  Sodium 135 - 145 mmol/L 139 138 -  Potassium 3.5 - 5.1 mmol/L 3.5 4.2 -  Chloride 98 - 111 mmol/L 106 101 -  CO2 22 - 32 mmol/L 26 26 -  Calcium 8.9 - 10.3 mg/dL 8.1(L) 9.2 -  Total Protein 6.5 - 8.1 g/dL - - -  Total Bilirubin 0.3 - 1.2 mg/dL - - -  Alkaline Phos 38 - 126 U/L - - -  AST 15 - 41 U/L - - -  ALT 0 - 44 U/L - - -   CBC Latest Ref Rng & Units 07/06/2018 07/05/2018 06/20/2018  WBC 4.0 - 10.5 K/uL 8.7 12.6(H) 8.0  Hemoglobin 12.0 - 15.0 g/dL 11.2(L) 11.7(L) 14.3  Hematocrit 36.0 - 46.0 % 34.7(L) 35.9(L) 43.8  Platelets  150 - 400 K/uL 344 380 330    RADS: n/a Assessment:   Recurrent diverticulitis, pain slightly improving, drain serosanguinous, asking for food so will slowly try clears and monitor

## 2018-07-08 LAB — GLUCOSE, CAPILLARY
Glucose-Capillary: 108 mg/dL — ABNORMAL HIGH (ref 70–99)
Glucose-Capillary: 122 mg/dL — ABNORMAL HIGH (ref 70–99)
Glucose-Capillary: 125 mg/dL — ABNORMAL HIGH (ref 70–99)
Glucose-Capillary: 152 mg/dL — ABNORMAL HIGH (ref 70–99)
Glucose-Capillary: 88 mg/dL (ref 70–99)
Glucose-Capillary: 99 mg/dL (ref 70–99)

## 2018-07-08 MED ORDER — LEVOTHYROXINE SODIUM 50 MCG PO TABS
75.0000 ug | ORAL_TABLET | Freq: Every day | ORAL | Status: DC
  Administered 2018-07-08 – 2018-07-10 (×3): 75 ug via ORAL
  Filled 2018-07-08 (×3): qty 2

## 2018-07-08 MED ORDER — TIZANIDINE HCL 2 MG PO TABS
4.0000 mg | ORAL_TABLET | Freq: Every evening | ORAL | Status: DC | PRN
  Filled 2018-07-08: qty 2

## 2018-07-08 MED ORDER — LOSARTAN POTASSIUM 50 MG PO TABS
50.0000 mg | ORAL_TABLET | Freq: Every day | ORAL | Status: DC
  Administered 2018-07-08 – 2018-07-10 (×3): 50 mg via ORAL
  Filled 2018-07-08 (×3): qty 1

## 2018-07-08 NOTE — Progress Notes (Signed)
Subjective:  CC:  Linda Bradford is a 62 y.o. female  Hospital stay day 3,   recurrent diverticulitis, IR drain placement  HPI: No specfic issues overnight.  Liquid BMs and pain improved slightly, tolerated clears  Yesterday but not feeling hungry this am.  ROS:  A 5 point review of systems was performed and pertinent positives and negatives noted in HPI.   Objective:      Temp:  [97.4 F (36.3 C)-97.6 F (36.4 C)] 97.4 F (36.3 C) (12/07 2025) Pulse Rate:  [79-84] 79 (12/07 2025) Resp:  [17-20] 20 (12/07 2025) BP: (161)/(64-68) 161/68 (12/07 2025) SpO2:  [96 %] 96 % (12/07 2025)     Height: 5\' 9"  (175.3 cm) Weight: 95.7 kg BMI (Calculated): 31.14   Intake/Output this shift:   Intake/Output Summary (Last 24 hours) at 07/08/2018 0958 Last data filed at 07/08/2018 6578 Gross per 24 hour  Intake 1449.06 ml  Output 640 ml  Net 809.06 ml        Constitutional :  alert, cooperative, appears stated age and no distress  Respiratory:  clear to auscultation bilaterally  Cardiovascular:  regular rate and rhythm  Gastrointestinal: Soft, no guarding, improved focal tenderness in LLQ and suprapubic region but still present, drain with serosanguinous discharge.   Skin: Cool and moist.   Psychiatric: Normal affect, non-agitated, not confused       LABS:  CMP Latest Ref Rng & Units 07/06/2018 07/05/2018 06/20/2018  Glucose 70 - 99 mg/dL 136(H) 145(H) -  BUN 8 - 23 mg/dL 9 14 -  Creatinine 0.44 - 1.00 mg/dL 0.62 0.49 0.71  Sodium 135 - 145 mmol/L 139 138 -  Potassium 3.5 - 5.1 mmol/L 3.5 4.2 -  Chloride 98 - 111 mmol/L 106 101 -  CO2 22 - 32 mmol/L 26 26 -  Calcium 8.9 - 10.3 mg/dL 8.1(L) 9.2 -  Total Protein 6.5 - 8.1 g/dL - - -  Total Bilirubin 0.3 - 1.2 mg/dL - - -  Alkaline Phos 38 - 126 U/L - - -  AST 15 - 41 U/L - - -  ALT 0 - 44 U/L - - -   CBC Latest Ref Rng & Units 07/06/2018 07/05/2018 06/20/2018  WBC 4.0 - 10.5 K/uL 8.7 12.6(H) 8.0  Hemoglobin 12.0 - 15.0 g/dL 11.2(L)  11.7(L) 14.3  Hematocrit 36.0 - 46.0 % 34.7(L) 35.9(L) 43.8  Platelets 150 - 400 K/uL 344 380 330    RADS: n/a Assessment:   Recurrent diverticulitis, pain slightly improving, drain serosanguinous, did ok with clears yesterday but feeling not so hungry this am.  Will keep on clears for today and see if she overall feels any better.

## 2018-07-09 DIAGNOSIS — K5720 Diverticulitis of large intestine with perforation and abscess without bleeding: Principal | ICD-10-CM

## 2018-07-09 LAB — GLUCOSE, CAPILLARY
Glucose-Capillary: 105 mg/dL — ABNORMAL HIGH (ref 70–99)
Glucose-Capillary: 108 mg/dL — ABNORMAL HIGH (ref 70–99)
Glucose-Capillary: 111 mg/dL — ABNORMAL HIGH (ref 70–99)
Glucose-Capillary: 121 mg/dL — ABNORMAL HIGH (ref 70–99)
Glucose-Capillary: 144 mg/dL — ABNORMAL HIGH (ref 70–99)
Glucose-Capillary: 82 mg/dL (ref 70–99)

## 2018-07-09 MED ORDER — INSULIN ASPART 100 UNIT/ML ~~LOC~~ SOLN: 0.0000 [IU] | Freq: Every day | SUBCUTANEOUS | Status: DC

## 2018-07-09 MED ORDER — INSULIN ASPART 100 UNIT/ML ~~LOC~~ SOLN
0.0000 [IU] | Freq: Three times a day (TID) | SUBCUTANEOUS | Status: DC
  Administered 2018-07-10 (×2): 4 [IU] via SUBCUTANEOUS
  Filled 2018-07-09 (×2): qty 1

## 2018-07-09 MED ORDER — AMOXICILLIN-POT CLAVULANATE 875-125 MG PO TABS
1.0000 | ORAL_TABLET | Freq: Two times a day (BID) | ORAL | Status: DC
  Administered 2018-07-09 – 2018-07-10 (×3): 1 via ORAL
  Filled 2018-07-09 (×3): qty 1

## 2018-07-09 NOTE — Progress Notes (Signed)
07/09/2018  Subjective: No acute events.  Patient reports she feels better today.  The drain is not as painful as it was on her last admission.  No troubles with flushing the drain either.  Tolerating clears, having diarrhea.  Vital signs: Temp:  [97.8 F (36.6 C)-98.4 F (36.9 C)] 98.3 F (36.8 C) (12/09 1150) Pulse Rate:  [64-74] 69 (12/09 1150) Resp:  [17-20] 17 (12/09 1150) BP: (138-153)/(62-70) 138/63 (12/09 1150) SpO2:  [92 %-97 %] 95 % (12/09 1150)   Intake/Output: 12/08 0701 - 12/09 0700 In: 1765.8 [P.O.:980; I.V.:629.8; IV Piggyback:151.1] Out: 15 [Drains:15] Last BM Date: 07/08/18  Physical Exam: Constitutional: No acute distress Abdomen: soft, nondistended, with only mild discomfort in left lower quadrant.  Percutaneous drain in place with serosanguinous fluid.    Labs:  No results for input(s): WBC, HGB, HCT, PLT in the last 72 hours. No results for input(s): NA, K, CL, CO2, GLUCOSE, BUN, CREATININE, CALCIUM in the last 72 hours.  Invalid input(s): MAGNESIUM No results for input(s): LABPROT, INR in the last 72 hours.  Imaging: No results found.  Assessment/Plan: This is a 62 y.o. female with recurrent diverticulitis with abscess, s/p percutaneous drainage.  --will advance to full liquid diet today --change antibiotics to po Augmentin.  Check CBC tomorrow --continue drain and flushes --OOB, ambulate --possible d/c home tomorrow with drain in place.   Melvyn Neth, [facility]

## 2018-07-10 LAB — CBC WITH DIFFERENTIAL/PLATELET
Abs Immature Granulocytes: 0.02 10*3/uL (ref 0.00–0.07)
Basophils Absolute: 0 10*3/uL (ref 0.0–0.1)
Basophils Relative: 1 %
Eosinophils Absolute: 0.2 10*3/uL (ref 0.0–0.5)
Eosinophils Relative: 4 %
HCT: 34 % — ABNORMAL LOW (ref 36.0–46.0)
Hemoglobin: 11.2 g/dL — ABNORMAL LOW (ref 12.0–15.0)
Immature Granulocytes: 0 %
Lymphocytes Relative: 42 %
Lymphs Abs: 2.2 10*3/uL (ref 0.7–4.0)
MCH: 30.4 pg (ref 26.0–34.0)
MCHC: 32.9 g/dL (ref 30.0–36.0)
MCV: 92.4 fL (ref 80.0–100.0)
Monocytes Absolute: 0.5 10*3/uL (ref 0.1–1.0)
Monocytes Relative: 10 %
Neutro Abs: 2.2 10*3/uL (ref 1.7–7.7)
Neutrophils Relative %: 43 %
Platelets: 406 10*3/uL — ABNORMAL HIGH (ref 150–400)
RBC: 3.68 MIL/uL — ABNORMAL LOW (ref 3.87–5.11)
RDW: 12.6 % (ref 11.5–15.5)
WBC: 5.2 10*3/uL (ref 4.0–10.5)
nRBC: 0 % (ref 0.0–0.2)

## 2018-07-10 LAB — GLUCOSE, CAPILLARY
Glucose-Capillary: 154 mg/dL — ABNORMAL HIGH (ref 70–99)
Glucose-Capillary: 156 mg/dL — ABNORMAL HIGH (ref 70–99)

## 2018-07-10 MED ORDER — AMOXICILLIN-POT CLAVULANATE 875-125 MG PO TABS: 1.0000 | ORAL_TABLET | Freq: Two times a day (BID) | ORAL | 0 refills | Status: AC

## 2018-07-10 MED ORDER — HYDROCODONE-ACETAMINOPHEN 5-325 MG PO TABS: 1.0000 | ORAL_TABLET | Freq: Four times a day (QID) | ORAL | 0 refills | Status: DC | PRN

## 2018-07-10 NOTE — Discharge Instructions (Signed)
Please flush your drain with 5-ml of normal saline twice a day.  Measure and record drainage.  Change dressing as needed.    Diverticulitis Diverticulitis is when small pockets in your large intestine (colon) get infected or swollen. This causes stomach pain and watery poop (diarrhea). These pouches are called diverticula. They form in people who have a condition called diverticulosis. Follow these instructions at home: Medicines  Take over-the-counter and prescription medicines only as told by your doctor. These include: ? Antibiotics. ? Pain medicines. ? Fiber pills. ? Probiotics. ? Stool softeners.  Do not drive or use heavy machinery while taking prescription pain medicine.  If you were prescribed an antibiotic, take it as told. Do not stop taking it even if you feel better. General instructions  Follow a diet as told by your doctor.  When you feel better, your doctor may tell you to change your diet. You may need to eat a lot of fiber. Fiber makes it easier to poop (have bowel movements). Healthy foods with fiber include: ? Berries. ? Beans. ? Lentils. ? Green vegetables.  Exercise 3 or more times a week. Aim for 30 minutes each time. Exercise enough to sweat and make your heart beat faster.  Keep all follow-up visits as told. This is important. You may need to have an exam of the large intestine. This is called a colonoscopy. Contact a doctor if:  Your pain does not get better.  You have a hard time eating or drinking.  You are not pooping like normal. Get help right away if:  Your pain gets worse.  Your problems do not get better.  Your problems get worse very fast.  You have a fever.  You throw up (vomit) more than one time.  You have poop that is: ? Bloody. ? Black. ? Tarry. Summary  Diverticulitis is when small pockets in your large intestine (colon) get infected or swollen.  Take medicines only as told by your doctor.  Follow a diet as told by  your doctor. This information is not intended to replace advice given to you by your health care provider. Make sure you discuss any questions you have with your health care provider. Document Released: 01/04/2008 Document Revised: 08/04/2016 Document Reviewed: 08/04/2016 Elsevier Interactive Patient Education  2017 [employer].

## 2018-07-10 NOTE — Discharge Summary (Addendum)
Discharge Summary  Patient ID: Linda Bradford MRN: [id number] DOB/AGE: [birth year] 62 y.o.  Admit date: 07/05/2018 Discharge date: 07/10/2018  Discharge Diagnoses Diverticulitis of large intestine with abscess  Consultants Interventional Radiology  Procedures Percutaneous drain placement on 12/06  HPI: Linda Bradford is a 62 y.o. female with a history of sigmoid diverticulitis with small abscess, which was able to be drained by IR on 10/31.  The patient had troubles with flushing the drain due to pain each time the drain would flush, and the flush was decreased from 10 ml to 5 ml.  The drain was removed on 11/12 as it had low volume output.  She did well until 11/20 when she called the office complaining of worsening pain again.  Her labs were unremarkable, as her WBC was 8.0, but her CT scan did show a small 1.5 cm abscess in the same location as prior.  She was started on antibiotic course again and she improved, until 12/1, when she started having worsening pain again.  She reports having sweats at night, worsening left lower quadrant pain in the same location as before, and nausea, but no emesis.  She presented to the clinic 12/05 and was seen by Dr. Rosana Hoes, who ordered labs and a repeat CT scan which was again concerning for diverticulitis with abscess.   Hospital Course: She was admitted to general surgery and interventional radiology was consulted. Informed consent was obtained and documented, and patient underwent uneventful percutaneous drain placement (Dr Barbie Banner, 07/06/2018).  Post-procedure, patient's pain and nausea improved/resolved and advancement of patient's diet and ambulation were well-tolerated. The remainder of patient's hospital course was essentially unremarkable, and discharge planning was initiated accordingly with patient safely able to be discharged home with appropriate discharge instructions, antibiotics, pain control, and outpatient follow-up after all of her questions were  answered to her expressed satisfaction.  Discharge Condition: Good   Physical Examination:  Constitutional: No acute distress Pulmonary: No respiratory distress Abdomen: soft, nondistended, with only mild discomfort in left lower quadrant.  Percutaneous drain in place with serosanguinous fluid.   Skin: Warm and dry Pysch: A&Ox3    Allergies as of 07/10/2018      Reactions   Dexilant [dexlansoprazole] Nausea Only, Other (See Comments)   Dizziness      Medication List    TAKE these medications   acetaminophen 500 MG tablet Commonly known as:  TYLENOL Take 1,000 mg by mouth every 6 (six) hours as needed for mild pain or moderate pain.   albuterol 108 (90 Base) MCG/ACT inhaler Commonly known as:  PROVENTIL HFA;VENTOLIN HFA Inhale 1 puff into the lungs every 4 (four) hours as needed.   amoxicillin-clavulanate 875-125 MG tablet Commonly known as:  AUGMENTIN Take 1 tablet by mouth every 12 (twelve) hours for 10 days.   benzonatate 200 MG capsule Commonly known as:  TESSALON Take 1 capsule (200 mg total) by mouth 3 (three) times daily as needed for cough.   BREO ELLIPTA 200-25 MCG/INH Aepb Generic drug:  fluticasone furoate-vilanterol INHALE 1 PUFF BY MOUTH ONCE DAILY What changed:  See the new instructions.   clonazePAM 0.5 MG tablet Commonly known as:  KLONOPIN TAKE 1/2 TABLET BY MOUTH TWICE DAILY AS NEEDED   diclofenac sodium 1 % Gel Commonly known as:  VOLTAREN Apply 4 g topically 4 (four) times daily as needed.   DULoxetine 60 MG capsule Commonly known as:  CYMBALTA TAKE 1 CAPSULE BY MOUTH TWICE DAILY   ezetimibe 10 MG tablet Commonly known as:  ZETIA TAKE 1 TABLET BY MOUTH ONCE DAILY   gabapentin 300 MG capsule Commonly known as:  NEURONTIN Take 300-1,200 mg by mouth See admin instructions. Take 1 capsule (300MG ) by mouth every morning and 4 capsules (1200MG ) by mouth every night at bedtime   glucose blood test strip To check blood sugar once daily    HYDROcodone-acetaminophen 5-325 MG tablet Commonly known as:  NORCO/VICODIN Take 1 tablet by mouth every 6 (six) hours as needed for moderate pain or severe pain. What changed:  reasons to take this   ipratropium-albuterol 0.5-2.5 (3) MG/3ML Soln Commonly known as:  DUONEB Take 3 mLs by nebulization every 6 (six) hours as needed.   levothyroxine 75 MCG tablet Commonly known as:  SYNTHROID, LEVOTHROID TAKE 1 TABLET BY MOUTH ONCE DAILY ON AN EMPTY STOMACH. WAIT 30 MINUTES BEFORE TAKING OTHER MEDS. What changed:  See the new instructions.   losartan 50 MG tablet Commonly known as:  COZAAR TAKE 1 TABLET BY MOUTH ONCE DAILY   meclizine 25 MG tablet Commonly known as:  ANTIVERT Take 1 tablet (25 mg total) by mouth 3 (three) times daily as needed for dizziness.   meloxicam 15 MG tablet Commonly known as:  MOBIC TAKE 1 TABLET BY MOUTH ONCE DAILY.   metFORMIN 500 MG 24 hr tablet Commonly known as:  GLUCOPHAGE-XR TAKE 2 TABLETS BY MOUTH ONCE DAILY WITH SUPPER   nicotine 21 mg/24hr patch Commonly known as:  NICODERM CQ - dosed in mg/24 hours Place 1 patch (21 mg total) onto the skin daily.   polyethylene glycol packet Commonly known as:  MIRALAX / GLYCOLAX Take 17 g by mouth daily.   pyridOXINE 100 MG tablet Commonly known as:  VITAMIN B-6 Take 100 mg by mouth daily.   RA VITAMIN B-12 TR 1000 MCG Tbcr Generic drug:  Cyanocobalamin Take 1,000 mcg by mouth daily.   THEO-24 300 MG 24 hr capsule Generic drug:  theophylline TAKE 1 CAPSULE BY MOUTH ONCE DAILY   tiZANidine 4 MG capsule Commonly known as:  ZANAFLEX TAKE 1 CAPSULE BY MOUTH 3 TIMES DAILY What changed:    when to take this  reasons to take this   ergocalciferol 1.25 MG (50000 UT) capsule Commonly known as:  VITAMIN D2 once a week. What changed:  Another medication with the same name was changed. Make sure you understand how and when to take each.   Vitamin D (Ergocalciferol) 1.25 MG (50000 UT) Caps  capsule Commonly known as:  DRISDOL TAKE 1 CAPSULE BY MOUTH EVERY 7 DAYS What changed:  See the new instructions.        Follow-up Information    Olean Ree, MD. Go on 07/20/2018.   Specialty:  General Surgery Why:  Go to your follow up appointment with Dr Hampton Abbot on 12/20 at 1030 AM Contact information: [address] [place] [address] [phone]           Signed: Edison Simon , PA-C [facility]  07/10/2018, 1:59 PM [phone] M-F: 7am - 4pm

## 2018-07-10 NOTE — Progress Notes (Signed)
Discharge instructions reviewed with patient including followup visits, new medications, and drain management instructions.  Understanding was verbalized and all questions were answered.  IV removed without complication; patient tolerated well.  Patient discharged home via wheelchair in stable condition escorted by volunteer staff.

## 2018-07-11 ENCOUNTER — Ambulatory Visit: Payer: BLUE CROSS/BLUE SHIELD | Admitting: Surgery

## 2018-07-11 ENCOUNTER — Telehealth: Payer: Self-pay | Admitting: Physician Assistant

## 2018-07-11 LAB — AEROBIC/ANAEROBIC CULTURE W GRAM STAIN (SURGICAL/DEEP WOUND)

## 2018-07-11 LAB — AEROBIC/ANAEROBIC CULTURE (SURGICAL/DEEP WOUND)

## 2018-07-11 NOTE — Telephone Encounter (Signed)
Linda Bradford with the [facility]. Called to inform us that she is trying to reach Linda Bradford to get her enrolled in a nursing support program to help keep her out of the hospital at no charge to patient.

## 2018-07-11 NOTE — Telephone Encounter (Signed)
She is currently in the hospital having surgery for an abscess from diverticulitis.

## 2018-07-17 ENCOUNTER — Other Ambulatory Visit: Payer: Self-pay | Admitting: Physician Assistant

## 2018-07-17 DIAGNOSIS — R110 Nausea: Secondary | ICD-10-CM

## 2018-07-20 ENCOUNTER — Ambulatory Visit (INDEPENDENT_AMBULATORY_CARE_PROVIDER_SITE_OTHER): Payer: BLUE CROSS/BLUE SHIELD | Admitting: Surgery

## 2018-07-20 ENCOUNTER — Other Ambulatory Visit: Payer: Self-pay

## 2018-07-20 ENCOUNTER — Encounter: Payer: Self-pay | Admitting: Surgery

## 2018-07-20 VITALS — BP 133/81 | HR 101 | Temp 95.9°F | Resp 18 | Ht 69.5 in | Wt 208.6 lb

## 2018-07-20 DIAGNOSIS — K5720 Diverticulitis of large intestine with perforation and abscess without bleeding: Secondary | ICD-10-CM

## 2018-07-20 NOTE — Patient Instructions (Signed)
Please call our office if you have questions or concerns.   Please keep your appointment for the Colonoscopy consult with Dr.Anna 08/06/2018.

## 2018-07-20 NOTE — Progress Notes (Signed)
07/20/2018  History of Present Illness: Linda Bradford is a 62 y.o. female with diverticulitis with abscess.  She initially had episode with small abscess that was drained by IR on 10/31.  She had a recurrence with worse abscess that was drained on 12/6 by IR.  She's been doing well since.  Reports that the drain has less issues this time compared to before.  No pain with flushing the drain, and no pain at the drain site either.  She sometimes will have discomfort with bowel movement, but she reports that overall she's doing better this time around compared to the prior episode.  Drain output has been low and is more serous.  Past Medical History: Past Medical History:  Diagnosis Date  . Anxiety   . COPD (chronic obstructive pulmonary disease) ([place])   . Depression   . Diabetes mellitus without complication ([place])   . Fatigue   . Headache   . Hypertension   . Thyroid disease      Past Surgical History: Past Surgical History:  Procedure Laterality Date  . APPENDECTOMY    . TUBAL LIGATION      Home Medications: Prior to Admission medications   Medication Sig Start Date End Date Taking? Authorizing Provider  acetaminophen (TYLENOL) 500 MG tablet Take 1,000 mg by mouth every 6 (six) hours as needed for mild pain or moderate pain.    Yes [provider]  albuterol (PROVENTIL HFA;VENTOLIN HFA) 108 (90 Base) MCG/ACT inhaler Inhale 1 puff into the lungs every 4 (four) hours as needed. 08/04/17  Yes Mar Daring, PA-C  amoxicillin-clavulanate (AUGMENTIN) 875-125 MG tablet Take 1 tablet by mouth every 12 (twelve) hours for 10 days. 07/10/18 07/20/18 Yes Tylene Fantasia, PA-C  benzonatate (TESSALON) 200 MG capsule Take 1 capsule (200 mg total) by mouth 3 (three) times daily as needed for cough. 04/17/18  Yes Burnette, Jennifer M, PA-C  BREO ELLIPTA 200-25 MCG/INH AEPB INHALE 1 PUFF BY MOUTH ONCE DAILY Patient taking differently: Inhale 1 puff into the lungs daily.  01/25/18  Yes  Burnette, Clearnce Sorrel, PA-C  clonazePAM (KLONOPIN) 0.5 MG tablet TAKE 1/2 TABLET BY MOUTH TWICE DAILY AS NEEDED 02/23/18  Yes Fenton Malling M, PA-C  Cyanocobalamin (RA VITAMIN B-12 TR) 1000 MCG TBCR Take 1,000 mcg by mouth daily.    Yes [provider]  diclofenac sodium (VOLTAREN) 1 % GEL Apply 4 g topically 4 (four) times daily as needed. 03/01/18  Yes Mar Daring, PA-C  DULoxetine (CYMBALTA) 60 MG capsule TAKE 1 CAPSULE BY MOUTH TWICE DAILY 02/23/18  Yes Mar Daring, PA-C  ergocalciferol (VITAMIN D2) 1.25 MG (50000 UT) capsule once a week.    Yes [provider]  ezetimibe (ZETIA) 10 MG tablet TAKE 1 TABLET BY MOUTH ONCE DAILY 06/20/18  Yes Fenton Malling M, PA-C  gabapentin (NEURONTIN) 300 MG capsule Take 300-1,200 mg by mouth See admin instructions. Take 1 capsule (300MG ) by mouth every morning and 4 capsules (1200MG ) by mouth every night at bedtime   Yes [provider]  glucose blood (CONTOUR NEXT TEST) test strip To check blood sugar once daily 06/09/17  Yes Burnette, Jennifer M, PA-C  HYDROcodone-acetaminophen (NORCO/VICODIN) 5-325 MG tablet Take 1 tablet by mouth every 6 (six) hours as needed for moderate pain or severe pain. 07/10/18  Yes Tylene Fantasia, PA-C  ipratropium-albuterol (DUONEB) 0.5-2.5 (3) MG/3ML SOLN Take 3 mLs by nebulization every 6 (six) hours as needed. 11/04/15  Yes Mar Daring, PA-C  levothyroxine (SYNTHROID, LEVOTHROID) 75 MCG tablet TAKE 1 TABLET BY MOUTH ONCE DAILY ON AN EMPTY STOMACH. WAIT 30 MINUTES BEFORE TAKING OTHER MEDS. Patient taking differently: Take 75 mcg by mouth daily.  02/23/18  Yes Mar Daring, PA-C  losartan (COZAAR) 50 MG tablet TAKE 1 TABLET BY MOUTH ONCE DAILY 10/26/17  Yes Mar Daring, PA-C  meclizine (ANTIVERT) 25 MG tablet Take 1 tablet (25 mg total) by mouth 3 (three) times daily as needed for dizziness. 05/15/18  Yes Burnette, Clearnce Sorrel, PA-C  meloxicam (MOBIC) 15 MG  tablet TAKE 1 TABLET BY MOUTH ONCE DAILY. 03/28/18  Yes Fenton Malling M, PA-C  metFORMIN (GLUCOPHAGE-XR) 500 MG 24 hr tablet TAKE 2 TABLETS BY MOUTH ONCE DAILY WITH SUPPER 03/01/18  Yes Burnette, Jennifer M, PA-C  nicotine (NICODERM CQ - DOSED IN MG/24 HOURS) 21 mg/24hr patch Place 1 patch (21 mg total) onto the skin daily. 06/02/18  Yes Edison Simon R, PA-C  ondansetron (ZOFRAN) 4 MG tablet TAKE 1 TABLET BY MOUTH EVERY 8 HOURS AS NEEDED 07/17/18  Yes Fenton Malling M, PA-C  polyethylene glycol (MIRALAX / GLYCOLAX) packet Take 17 g by mouth daily. 06/02/18  Yes Edison Simon R, PA-C  pyridOXINE (VITAMIN B-6) 100 MG tablet Take 100 mg by mouth daily.   Yes [provider]  THEO-24 300 MG 24 hr capsule TAKE 1 CAPSULE BY MOUTH ONCE DAILY 06/11/18  Yes Burnette, Anderson Malta M, PA-C  tiZANidine (ZANAFLEX) 4 MG capsule TAKE 1 CAPSULE BY MOUTH 3 TIMES DAILY Patient taking differently: Take 4 mg by mouth at bedtime as needed for muscle spasms.  02/19/18  Yes Mar Daring, PA-C  Vitamin D, Ergocalciferol, (DRISDOL) 50000 units CAPS capsule TAKE 1 CAPSULE BY MOUTH EVERY 7 DAYS Patient taking differently: Take 50,000 Units by mouth every Tuesday.  12/01/17  Yes Mar Daring, PA-C    Allergies: Allergies  Allergen Reactions  . Dexilant [Dexlansoprazole] Nausea Only and Other (See Comments)    Dizziness    Review of Systems: Review of Systems  Constitutional: Negative for chills and fever.  Respiratory: Negative for shortness of breath.   Cardiovascular: Negative for chest pain.  Gastrointestinal: Negative for abdominal pain, nausea and vomiting.    Physical Exam BP 133/81   Pulse (!) 101   Temp (!) 95.9 F (35.5 C) (Temporal)   Resp 18   Ht 5' 9.5" (1.765 m)   Wt 208 lb 9.6 oz (94.6 kg)   SpO2 94%   BMI 30.36 kg/m  CONSTITUTIONAL: No acute distress HEENT:  Normocephalic, atraumatic, extraocular motion intact. RESPIRATORY:  Lungs are clear, and breath sounds  are equal bilaterally. Normal respiratory effort without pathologic use of accessory muscles. CARDIOVASCULAR: Heart is regular without murmurs, gallops, or rubs. GI: The abdomen is soft, non-distended, non-tender to palpation.  Drain site with no tenderness.  Drain in place with serous fluid in drain bulb, no foul odor.  NEUROLOGIC:  Motor and sensation is grossly normal.  Cranial nerves are grossly intact. PSYCH:  Alert and oriented to person, place and time. Affect is normal.  Labs/Imaging: None since discharge.  Assessment and Plan: This is a 62 y.o. female with diverticulitis with abscess, s/p IR drain placement on 12/6.  --Discussed with Dr. Barbie Banner with radiology today and will order CT scan of pelvis with contrast as well as fluoro contrast injection for drain, to evaluate the abscess to make sure the abscess is well drained prior to removing drain. --Patient will follow up after studies  are done to discuss and possibly remove drain. --Patient has consult appointment with GI on 08/06/18 for colonoscopy.  Given the recurrence with drain placement, would recommend doing colonoscopy in February, but she can keep the appointment so she can meed Dr. Vicente Males and go over the procedure.  She will then follow up again after colonoscopy is done to discuss results and talk about sigmoidectomy in the future.  Face-to-face time spent with the patient and care providers was 15 minutes, with more than 50% of the time spent counseling, educating, and coordinating care of the patient.     Melvyn Neth, [facility]

## 2018-07-23 ENCOUNTER — Other Ambulatory Visit: Payer: Self-pay

## 2018-07-23 ENCOUNTER — Telehealth: Payer: Self-pay

## 2018-07-23 ENCOUNTER — Other Ambulatory Visit: Payer: Self-pay | Admitting: Internal Medicine

## 2018-07-23 DIAGNOSIS — K5732 Diverticulitis of large intestine without perforation or abscess without bleeding: Secondary | ICD-10-CM

## 2018-07-23 NOTE — Telephone Encounter (Signed)
The patient is scheduled for a CT abdomen/pelvis with contrast and also a Fluoro Tube check at [facility] on 08/08/18. She will follow up with Dr Hampton Abbot in his [place] office on 08/14/18 at 1:30 pm. The patient is aware of dates, times, and instructions.

## 2018-07-27 ENCOUNTER — Ambulatory Visit (INDEPENDENT_AMBULATORY_CARE_PROVIDER_SITE_OTHER): Payer: BLUE CROSS/BLUE SHIELD | Admitting: Surgery

## 2018-07-27 ENCOUNTER — Other Ambulatory Visit: Payer: Self-pay

## 2018-07-27 ENCOUNTER — Telehealth: Payer: Self-pay

## 2018-07-27 ENCOUNTER — Encounter: Payer: Self-pay | Admitting: Surgery

## 2018-07-27 VITALS — BP 124/74 | HR 76 | Temp 97.8°F | Resp 13 | Ht 70.0 in | Wt 208.0 lb

## 2018-07-27 DIAGNOSIS — K5720 Diverticulitis of large intestine with perforation and abscess without bleeding: Secondary | ICD-10-CM | POA: Diagnosis not present

## 2018-07-27 MED ORDER — AMOXICILLIN-POT CLAVULANATE 875-125 MG PO TABS: 1.0000 | ORAL_TABLET | Freq: Two times a day (BID) | ORAL | 0 refills | Status: AC

## 2018-07-27 NOTE — Telephone Encounter (Signed)
Patient called and says that she started up yesterday and has been off and on and has been more frequent. She is afraid it is her diverticulitis starting up and would like to be seen.

## 2018-07-27 NOTE — Progress Notes (Signed)
07/27/2018  History of Present Illness: Linda Bradford is a 62 y.o. female known to my service with diverticulitis with abscess.  She was in the hospital on 12/5 with second episode of diverticulitis with abscess, and drain was placed.  She was last seen in office on 12/20 and had been doing well since.  She had finished her antibiotic a week ago.  She has not had any further pain and was flushing her drain as indicated.  However, yesterday she started having some more lower abdomen pain going across her low abdomen.  Today she started having some pain going to her back and she felt this was too similar to her prior pain and called the office to be evaluated.  Denies any fevers, chills, chest pain, shortness of breath.  Reports no diarrhea or constipation or blood in the stools.  Past Medical History: Past Medical History:  Diagnosis Date  . Anxiety   . COPD (chronic obstructive pulmonary disease) ([place])   . Depression   . Diabetes mellitus without complication ([place])   . Fatigue   . Headache   . Hypertension   . Thyroid disease      Past Surgical History: Past Surgical History:  Procedure Laterality Date  . APPENDECTOMY    . TUBAL LIGATION      Home Medications: Prior to Admission medications   Medication Sig Start Date End Date Taking? Authorizing Provider  acetaminophen (TYLENOL) 500 MG tablet Take 1,000 mg by mouth every 6 (six) hours as needed for mild pain or moderate pain.    Yes [provider]  albuterol (PROVENTIL HFA;VENTOLIN HFA) 108 (90 Base) MCG/ACT inhaler Inhale 1 puff into the lungs every 4 (four) hours as needed. 08/04/17  Yes Mar Daring, PA-C  benzonatate (TESSALON) 200 MG capsule Take 1 capsule (200 mg total) by mouth 3 (three) times daily as needed for cough. 04/17/18  Yes Burnette, Jennifer M, PA-C  BREO ELLIPTA 200-25 MCG/INH AEPB INHALE 1 PUFF BY MOUTH ONCE DAILY Patient taking differently: Inhale 1 puff into the lungs daily.  01/25/18  Yes  Burnette, Clearnce Sorrel, PA-C  clonazePAM (KLONOPIN) 0.5 MG tablet TAKE 1/2 TABLET BY MOUTH TWICE DAILY AS NEEDED 02/23/18  Yes Fenton Malling M, PA-C  Cyanocobalamin (RA VITAMIN B-12 TR) 1000 MCG TBCR Take 1,000 mcg by mouth daily.    Yes [provider]  diclofenac sodium (VOLTAREN) 1 % GEL Apply 4 g topically 4 (four) times daily as needed. 03/01/18  Yes Mar Daring, PA-C  DULoxetine (CYMBALTA) 60 MG capsule TAKE 1 CAPSULE BY MOUTH TWICE DAILY 02/23/18  Yes Mar Daring, PA-C  ergocalciferol (VITAMIN D2) 1.25 MG (50000 UT) capsule once a week.    Yes [provider]  ezetimibe (ZETIA) 10 MG tablet TAKE 1 TABLET BY MOUTH ONCE DAILY 06/20/18  Yes Fenton Malling M, PA-C  gabapentin (NEURONTIN) 300 MG capsule Take 300-1,200 mg by mouth See admin instructions. Take 1 capsule (300MG ) by mouth every morning and 4 capsules (1200MG ) by mouth every night at bedtime   Yes [provider]  glucose blood (CONTOUR NEXT TEST) test strip To check blood sugar once daily 06/09/17  Yes Burnette, Jennifer M, PA-C  HYDROcodone-acetaminophen (NORCO/VICODIN) 5-325 MG tablet Take 1 tablet by mouth every 6 (six) hours as needed for moderate pain or severe pain. 07/10/18  Yes Tylene Fantasia, PA-C  ipratropium-albuterol (DUONEB) 0.5-2.5 (3) MG/3ML SOLN Take 3 mLs by nebulization every 6 (six) hours as needed. 11/04/15  Yes Burnette,  Clearnce Sorrel, PA-C  levothyroxine (SYNTHROID, LEVOTHROID) 75 MCG tablet TAKE 1 TABLET BY MOUTH ONCE DAILY ON AN EMPTY STOMACH. WAIT 30 MINUTES BEFORE TAKING OTHER MEDS. Patient taking differently: Take 75 mcg by mouth daily.  02/23/18  Yes Mar Daring, PA-C  losartan (COZAAR) 50 MG tablet TAKE 1 TABLET BY MOUTH ONCE DAILY 10/26/17  Yes Mar Daring, PA-C  meclizine (ANTIVERT) 25 MG tablet Take 1 tablet (25 mg total) by mouth 3 (three) times daily as needed for dizziness. 05/15/18  Yes Burnette, Clearnce Sorrel, PA-C  meloxicam (MOBIC) 15 MG  tablet TAKE 1 TABLET BY MOUTH ONCE DAILY. 03/28/18  Yes Fenton Malling M, PA-C  metFORMIN (GLUCOPHAGE-XR) 500 MG 24 hr tablet TAKE 2 TABLETS BY MOUTH ONCE DAILY WITH SUPPER 03/01/18  Yes Burnette, Jennifer M, PA-C  nicotine (NICODERM CQ - DOSED IN MG/24 HOURS) 21 mg/24hr patch Place 1 patch (21 mg total) onto the skin daily. 06/02/18  Yes Edison Simon R, PA-C  ondansetron (ZOFRAN) 4 MG tablet TAKE 1 TABLET BY MOUTH EVERY 8 HOURS AS NEEDED 07/17/18  Yes Fenton Malling M, PA-C  polyethylene glycol (MIRALAX / GLYCOLAX) packet Take 17 g by mouth daily. 06/02/18  Yes Edison Simon R, PA-C  pyridOXINE (VITAMIN B-6) 100 MG tablet Take 100 mg by mouth daily.   Yes [provider]  THEO-24 300 MG 24 hr capsule TAKE 1 CAPSULE BY MOUTH ONCE DAILY 06/11/18  Yes Burnette, Anderson Malta M, PA-C  tiZANidine (ZANAFLEX) 4 MG capsule TAKE 1 CAPSULE BY MOUTH 3 TIMES DAILY Patient taking differently: Take 4 mg by mouth at bedtime as needed for muscle spasms.  02/19/18  Yes Mar Daring, PA-C  Vitamin D, Ergocalciferol, (DRISDOL) 50000 units CAPS capsule TAKE 1 CAPSULE BY MOUTH EVERY 7 DAYS Patient taking differently: Take 50,000 Units by mouth every Tuesday.  12/01/17  Yes Mar Daring, PA-C  amoxicillin-clavulanate (AUGMENTIN) 875-125 MG tablet Take 1 tablet by mouth 2 (two) times daily for 14 days. 07/27/18 08/10/18  Olean Ree, MD    Allergies: Allergies  Allergen Reactions  . Dexilant [Dexlansoprazole] Nausea Only and Other (See Comments)    Dizziness    Review of Systems: Review of Systems  Constitutional: Negative for chills and fever.  Respiratory: Negative for shortness of breath.   Cardiovascular: Negative for chest pain.  Gastrointestinal: Positive for abdominal pain. Negative for constipation, diarrhea, nausea and vomiting.    Physical Exam BP 124/74   Pulse 76   Temp 97.8 F (36.6 C) (Skin)   Resp 13   Ht 5\' 10"  (1.778 m)   Wt 208 lb (94.3 kg)   SpO2 98%   BMI  29.84 kg/m  CONSTITUTIONAL: No acute distress HEENT:  Normocephalic, atraumatic, extraocular motion intact. RESPIRATORY:  Lungs are clear, and breath sounds are equal bilaterally. Normal respiratory effort without pathologic use of accessory muscles. CARDIOVASCULAR: Heart is regular without murmurs, gallops, or rubs. GI: The abdomen is soft, nondistended, currently nontender to palpation.  Her drain is in place and secured.  Upon flushing, there is mild amount of flush fluid that drains out around the drain.  On aspirating, serous fluid is seen and no stool or purulence.  NEUROLOGIC:  Motor and sensation is grossly normal.  Cranial nerves are grossly intact. PSYCH:  Alert and oriented to person, place and time. Affect is normal.  Labs/Imaging: None recent  Assessment and Plan: This is a 62 y.o. female with diverticulitis with abscess, currently with drain placement.  Discussed with the  patient that the pain may be related to the drain.  She has been having almost no output from drain but it does flush easily.  I don't think it's clogged, but the abscess cavity may be so collapsed now that the drain may be causing her discomfort.  However, she may be having another flare up and worse abscess or new abscess in a different location.  As a precaution, will start her on Augmentin again for a 7 day course.  Her CT scan and drain study are scheduled for January 8th, but was able to move the studies ahead of time to be done on 12/30.  She will follow up with me afterwards to discuss the results.  Face-to-face time spent with the patient and care providers was 15 minutes, with more than 50% of the time spent counseling, educating, and coordinating care of the patient.     Melvyn Neth, [facility]

## 2018-07-27 NOTE — Patient Instructions (Addendum)
Rx sent . Schedule ct scan .

## 2018-07-30 ENCOUNTER — Telehealth: Payer: Self-pay | Admitting: *Deleted

## 2018-07-30 ENCOUNTER — Encounter: Payer: Self-pay | Admitting: *Deleted

## 2018-07-30 ENCOUNTER — Ambulatory Visit (INDEPENDENT_AMBULATORY_CARE_PROVIDER_SITE_OTHER): Payer: BLUE CROSS/BLUE SHIELD | Admitting: Surgery

## 2018-07-30 ENCOUNTER — Other Ambulatory Visit: Payer: Self-pay | Admitting: Interventional Radiology

## 2018-07-30 ENCOUNTER — Ambulatory Visit
Admission: RE | Admit: 2018-07-30 | Discharge: 2018-07-30 | Disposition: A | Discharge status: Home / Self Care | Payer: BLUE CROSS/BLUE SHIELD | Source: Ambulatory Visit | Attending: Surgery | Admitting: Surgery

## 2018-07-30 ENCOUNTER — Encounter: Payer: Self-pay | Admitting: Surgery

## 2018-07-30 ENCOUNTER — Other Ambulatory Visit: Payer: Self-pay

## 2018-07-30 VITALS — BP 134/68 | HR 89 | Temp 97.9°F | Ht 70.0 in | Wt 207.6 lb

## 2018-07-30 DIAGNOSIS — K5732 Diverticulitis of large intestine without perforation or abscess without bleeding: Secondary | ICD-10-CM

## 2018-07-30 DIAGNOSIS — Z833 Family history of diabetes mellitus: Secondary | ICD-10-CM | POA: Diagnosis not present

## 2018-07-30 DIAGNOSIS — Z79899 Other long term (current) drug therapy: Secondary | ICD-10-CM | POA: Diagnosis not present

## 2018-07-30 DIAGNOSIS — Z7984 Long term (current) use of oral hypoglycemic drugs: Secondary | ICD-10-CM | POA: Insufficient documentation

## 2018-07-30 DIAGNOSIS — K5720 Diverticulitis of large intestine with perforation and abscess without bleeding: Secondary | ICD-10-CM | POA: Diagnosis not present

## 2018-07-30 DIAGNOSIS — E119 Type 2 diabetes mellitus without complications: Secondary | ICD-10-CM | POA: Insufficient documentation

## 2018-07-30 DIAGNOSIS — K632 Fistula of intestine: Secondary | ICD-10-CM | POA: Diagnosis not present

## 2018-07-30 DIAGNOSIS — Z79890 Hormone replacement therapy (postmenopausal): Secondary | ICD-10-CM | POA: Insufficient documentation

## 2018-07-30 DIAGNOSIS — J449 Chronic obstructive pulmonary disease, unspecified: Secondary | ICD-10-CM | POA: Diagnosis not present

## 2018-07-30 DIAGNOSIS — F17210 Nicotine dependence, cigarettes, uncomplicated: Secondary | ICD-10-CM | POA: Insufficient documentation

## 2018-07-30 DIAGNOSIS — I10 Essential (primary) hypertension: Secondary | ICD-10-CM | POA: Diagnosis not present

## 2018-07-30 DIAGNOSIS — L0291 Cutaneous abscess, unspecified: Secondary | ICD-10-CM | POA: Diagnosis not present

## 2018-07-30 MED ORDER — IOPAMIDOL (ISOVUE-300) INJECTION 61%
150.0000 mL | Freq: Once | INTRAVENOUS | Status: AC | PRN
  Administered 2018-07-30: 150 mL

## 2018-07-30 MED ORDER — SODIUM CHLORIDE (PF) 0.9 % IJ SOLN
10.0000 mL | INTRAMUSCULAR | Status: DC | PRN
  Administered 2018-07-30: 10 mL via INTRAVENOUS
  Filled 2018-07-30: qty 10

## 2018-07-30 MED ORDER — IOHEXOL 300 MG/ML  SOLN
100.0000 mL | Freq: Once | INTRAMUSCULAR | Status: AC | PRN
  Administered 2018-07-30: 100 mL via INTRAVENOUS

## 2018-07-30 NOTE — Progress Notes (Signed)
Patient ID: Linda Bradford, female   DOB: Nov 01, 1955, 62 y.o.   MRN: [id number]       Chief Complaint: Patient was seen in consultation today for No chief complaint on file.  at the request of [place]  Referring Physician(s): [place]  Supervising Physician: Marybelle Killings  Patient Status: [facility] - Out-pt  History of Present Illness: Linda Bradford is a 62 y.o. female who had a abscess recently drained by interventional radiology.  This was a recurrent abscess.  After the previous drain was removed, she began to have symptoms and a subsequent CT demonstrated the recurrent abscess.  After the second drainage, she has done well with diminished output.  She denies fevers or chills.  She did have some pain a few days ago but this has resolved and is currently on antibiotics.  Past Medical History:  Diagnosis Date  . Anxiety   . COPD (chronic obstructive pulmonary disease) ([place])   . Depression   . Diabetes mellitus without complication ([place])   . Fatigue   . Headache   . Hypertension   . Thyroid disease     Past Surgical History:  Procedure Laterality Date  . APPENDECTOMY    . TUBAL LIGATION      Allergies: Dexilant [dexlansoprazole]  Medications: Prior to Admission medications   Medication Sig Start Date End Date Taking? Authorizing Provider  acetaminophen (TYLENOL) 500 MG tablet Take 1,000 mg by mouth every 6 (six) hours as needed for mild pain or moderate pain.     [provider]  albuterol (PROVENTIL HFA;VENTOLIN HFA) 108 (90 Base) MCG/ACT inhaler Inhale 1 puff into the lungs every 4 (four) hours as needed. 08/04/17   Mar Daring, PA-C  amoxicillin-clavulanate (AUGMENTIN) 875-125 MG tablet Take 1 tablet by mouth 2 (two) times daily for 14 days. 07/27/18 08/10/18  Olean Ree, MD  benzonatate (TESSALON) 200 MG capsule Take 1 capsule (200 mg total) by mouth 3 (three) times daily as needed for cough. 04/17/18   Burnette, Clearnce Sorrel, PA-C  BREO ELLIPTA 200-25  MCG/INH AEPB INHALE 1 PUFF BY MOUTH ONCE DAILY Patient taking differently: Inhale 1 puff into the lungs daily.  01/25/18   Mar Daring, PA-C  clonazePAM (KLONOPIN) 0.5 MG tablet TAKE 1/2 TABLET BY MOUTH TWICE DAILY AS NEEDED 02/23/18   Mar Daring, PA-C  Cyanocobalamin (RA VITAMIN B-12 TR) 1000 MCG TBCR Take 1,000 mcg by mouth daily.     [provider]  diclofenac sodium (VOLTAREN) 1 % GEL Apply 4 g topically 4 (four) times daily as needed. 03/01/18   Mar Daring, PA-C  DULoxetine (CYMBALTA) 60 MG capsule TAKE 1 CAPSULE BY MOUTH TWICE DAILY 02/23/18   Mar Daring, PA-C  ergocalciferol (VITAMIN D2) 1.25 MG (50000 UT) capsule once a week.     [provider]  ezetimibe (ZETIA) 10 MG tablet TAKE 1 TABLET BY MOUTH ONCE DAILY 06/20/18   Mar Daring, PA-C  gabapentin (NEURONTIN) 300 MG capsule Take 300-1,200 mg by mouth See admin instructions. Take 1 capsule (300MG ) by mouth every morning and 4 capsules (1200MG ) by mouth every night at bedtime    [provider]  glucose blood (CONTOUR NEXT TEST) test strip To check blood sugar once daily 06/09/17   Mar Daring, PA-C  HYDROcodone-acetaminophen (NORCO/VICODIN) 5-325 MG tablet Take 1 tablet by mouth every 6 (six) hours as needed for moderate pain or severe pain. 07/10/18   Tylene Fantasia, PA-C  ipratropium-albuterol (DUONEB) 0.5-2.5 (3) MG/3ML SOLN  Take 3 mLs by nebulization every 6 (six) hours as needed. 11/04/15   Mar Daring, PA-C  levothyroxine (SYNTHROID, LEVOTHROID) 75 MCG tablet TAKE 1 TABLET BY MOUTH ONCE DAILY ON AN EMPTY STOMACH. WAIT 30 MINUTES BEFORE TAKING OTHER MEDS. Patient taking differently: Take 75 mcg by mouth daily.  02/23/18   Mar Daring, PA-C  losartan (COZAAR) 50 MG tablet TAKE 1 TABLET BY MOUTH ONCE DAILY 10/26/17   Mar Daring, PA-C  meclizine (ANTIVERT) 25 MG tablet Take 1 tablet (25 mg total) by mouth 3 (three) times daily as  needed for dizziness. 05/15/18   Mar Daring, PA-C  meloxicam (MOBIC) 15 MG tablet TAKE 1 TABLET BY MOUTH ONCE DAILY. 03/28/18   Mar Daring, PA-C  metFORMIN (GLUCOPHAGE-XR) 500 MG 24 hr tablet TAKE 2 TABLETS BY MOUTH ONCE DAILY WITH SUPPER 03/01/18   Burnette, Clearnce Sorrel, PA-C  nicotine (NICODERM CQ - DOSED IN MG/24 HOURS) 21 mg/24hr patch Place 1 patch (21 mg total) onto the skin daily. 06/02/18   Tylene Fantasia, PA-C  ondansetron (ZOFRAN) 4 MG tablet TAKE 1 TABLET BY MOUTH EVERY 8 HOURS AS NEEDED 07/17/18   Mar Daring, PA-C  polyethylene glycol (MIRALAX / GLYCOLAX) packet Take 17 g by mouth daily. 06/02/18   Tylene Fantasia, PA-C  pyridOXINE (VITAMIN B-6) 100 MG tablet Take 100 mg by mouth daily.    [provider]  THEO-24 300 MG 24 hr capsule TAKE 1 CAPSULE BY MOUTH ONCE DAILY 06/11/18   Mar Daring, PA-C  tiZANidine (ZANAFLEX) 4 MG capsule TAKE 1 CAPSULE BY MOUTH 3 TIMES DAILY Patient taking differently: Take 4 mg by mouth at bedtime as needed for muscle spasms.  02/19/18   Mar Daring, PA-C  Vitamin D, Ergocalciferol, (DRISDOL) 50000 units CAPS capsule TAKE 1 CAPSULE BY MOUTH EVERY 7 DAYS Patient taking differently: Take 50,000 Units by mouth every Tuesday.  12/01/17   Mar Daring, PA-C     Family History  Problem Relation Age of Onset  . Lung cancer Mother   . Alcohol abuse Sister   . Arthritis Sister   . Alcohol abuse Brother   . Heart disease Brother 69  . Heart attack Brother   . Pancreatic cancer Maternal Aunt   . Diabetes Maternal Aunt   . Alcohol abuse Son   . Drug abuse Son   . Drug abuse Son   . Heart disease Father 59  . Diabetes Sister   . Breast cancer Brother   . Alcohol abuse Maternal Uncle   . Alcohol abuse Maternal Grandmother   . Diabetes Maternal Grandmother     Social History   Socioeconomic History  . Marital status: Married    Spouse name: Not on file  . Number of children: Not on file  .  Years of education: Not on file  . Highest education level: Not on file  Occupational History  . Not on file  Social Needs  . Financial resource strain: Not on file  . Food insecurity:    Worry: Not on file    Inability: Not on file  . Transportation needs:    Medical: Not on file    Non-medical: Not on file  Tobacco Use  . Smoking status: Current Every Day Smoker    Packs/day: 1.00    Years: 45.00    Pack years: 45.00    Types: Cigarettes    Start date: 03/02/1980  . Smokeless tobacco: Never Used  .  Tobacco comment: every other day  Substance and Sexual Activity  . Alcohol use: No    Alcohol/week: 0.0 standard drinks  . Drug use: No  . Sexual activity: Yes  Lifestyle  . Physical activity:    Days per week: Not on file    Minutes per session: Not on file  . Stress: Not on file  Relationships  . Social connections:    Talks on phone: Not on file    Gets together: Not on file    Attends religious service: Not on file    Active member of club or organization: Not on file    Attends meetings of clubs or organizations: Not on file    Relationship status: Not on file  Other Topics Concern  . Not on file  Social History Narrative  . Not on file      Review of Systems: A 12 point ROS discussed and pertinent positives are indicated in the HPI above.  All other systems are negative.  Review of Systems  Vital Signs: There were no vitals taken for this visit.  Physical Exam  The left lower quadrant drain site is clean and dry.  The dressing was removed and exchanged.  There is no abdominal tenderness.  Imaging: Ct Abdomen Pelvis W Contrast  Result Date: 07/30/2018 CLINICAL DATA:  Follow-up abscess drain EXAM: CT ABDOMEN AND PELVIS WITH CONTRAST TECHNIQUE: Multidetector CT imaging of the abdomen and pelvis was performed using the standard protocol following bolus administration of intravenous contrast. CONTRAST:  139mL OMNIPAQUE IOHEXOL 300 MG/ML  SOLN COMPARISON:   07/05/2018 FINDINGS: Lower chest: No acute abnormality. Hepatobiliary: Unremarkable Pancreas: Unremarkable Spleen: Calcified granuloma. Adrenals/Urinary Tract: Adrenal glands are within normal limits. Kidneys are within normal limits. Bladder is unremarkable. Stomach/Bowel: There is moderate stool burden throughout the length of the colon. Diverticulosis of the sigmoid colon is again noted. Wall thickening of the sigmoid colon has improved. Inflammatory changes in the adjacent fat of also improved. No evidence of small-bowel obstruction. Stomach is decompressed. Vascular/Lymphatic: Aortic atherosclerotic calcifications. No abnormal retroperitoneal adenopathy. Reproductive: Uterus and ovaries are within normal limits. Other: A pigtail drain has been placed into the left pelvic abscess. The abscess has resolved. The drain remains in place. The fistulous tract to the overlying musculature has also resolved. Musculoskeletal: No vertebral compression deformity. Degenerative disc disease at L4-5 and L5-S1. IMPRESSION: Left pelvic abscess has resolved after drain placement. Drain injection is to follow under fluoroscopy. Findings related to diverticulitis of the sigmoid colon have improved. Electronically Signed   By: Marybelle Killings M.D.   On: 07/30/2018 08:26   Ct Abdomen Pelvis W Contrast  Result Date: 07/05/2018 CLINICAL DATA:  Worsening left lower quadrant abdominal pain along with nausea and diarrhea. History of diverticular abscess. EXAM: CT ABDOMEN AND PELVIS WITH CONTRAST TECHNIQUE: Multidetector CT imaging of the abdomen and pelvis was performed using the standard protocol following bolus administration of intravenous contrast. CONTRAST:  12mL ISOVUE-300 IOPAMIDOL (ISOVUE-300) INJECTION 61% COMPARISON:  Multiple recent abdominal CT scans. The most recent is 06/20/2018 FINDINGS: Lower chest: Streaky right basilar atelectasis but no infiltrates or effusions. The heart is normal in size. No pericardial effusion.  Hepatobiliary: No focal hepatic lesions or intrahepatic biliary dilatation. The gallbladder is normal. No common bile duct dilatation. Pancreas: No mass, inflammation or ductal dilatation. Spleen: Normal size.  No focal lesions.  Stable calcified granuloma. Adrenals/Urinary Tract: The adrenal glands and kidneys are unremarkable and stable. No worrisome renal lesions, hydronephrosis or renal calculi. The  bladder is unremarkable. Stomach/Bowel: The stomach, duodenum and small bowel are unremarkable. No acute inflammatory changes, mass lesions or obstructive findings. The terminal ileum is normal. The appendix is surgically absent. The ascending, transverse and descending colons appear unremarkable. There is moderate scattered colonic diverticulosis but no mass or obstruction. Recurrent sigmoid colon diverticulitis with a large trident shaped abscess measuring a maximum of 5.8 x 4.2 cm. No free air. Severe underlying sigmoid colon diverticulosis. There is a new fistulous tract extending from the sigmoid colon to the left rectus muscle. This is approximately 8 cm in length. Vascular/Lymphatic: Stable atherosclerotic calcifications involving the aorta but no aneurysm or dissection. The branch vessels are patent. The major venous structures are patent. No mesenteric or retroperitoneal mass or adenopathy. Small scattered lymph nodes are noted. Reproductive: Unremarkable and stable. Other: No free pelvic fluid collections. Scattered pelvic lymph nodes are likely inflammatory. No inguinal mass or adenopathy. Musculoskeletal: No significant bony findings. IMPRESSION: 1. Recurrent diverticulitis and a large trident shaped diverticular abscess. 2. New fistulous tract extending from the sigmoid colon to the left rectus muscle. 3. Stable diffuse colonic diverticulosis. These results will be called to the ordering clinician or representative by the Radiologist Assistant, and communication documented in the PACS or zVision  Dashboard. Electronically Signed   By: Marijo Sanes M.D.   On: 07/05/2018 14:08   Dg Sinus/fist Tube Chk-non Gi  Result Date: 07/30/2018 INDICATION: Follow-up abscess drain EXAM: PELVIC ABSCESS DRAIN INJECTION MEDICATIONS: The patient is currently admitted to the hospital and receiving intravenous antibiotics. The antibiotics were administered within an appropriate time frame prior to the initiation of the procedure. ANESTHESIA/SEDATION: None COMPLICATIONS: None immediate. PROCEDURE: Informed written consent was obtained from the patient after a thorough discussion of the procedural risks, benefits and alternatives. All questions were addressed. Maximal Sterile Barrier Technique was utilized including caps, mask, sterile gowns, sterile gloves, sterile drape, hand hygiene and skin antiseptic. A timeout was performed prior to the initiation of the procedure. Contrast was injected into the left pelvic abscess drain and imaging was obtained. FINDINGS: The abscess cavity has decompressed. There is a fistula to the adjacent sigmoid colon. Contrast fills the sigmoid colon. IMPRESSION: The abscess has decompressed but the study is positive for a fistula to adjacent sigmoid colon. Repeat injection will be schedule for 2 weeks. Electronically Signed   By: Marybelle Killings M.D.   On: 07/30/2018 09:17   Ct Image Guided Drainage By Percutaneous Catheter  Result Date: 07/06/2018 INDICATION: Recurrent pelvic abscess EXAM: CT GUIDED DRAINAGE OF PELVIC ABSCESS MEDICATIONS: The patient is currently admitted to the hospital and receiving intravenous antibiotics. The antibiotics were administered within an appropriate time frame prior to the initiation of the procedure. ANESTHESIA/SEDATION: Two mg IV Versed 100 mcg IV Fentanyl Moderate Sedation Time:  18 minutes The patient was continuously monitored during the procedure by the interventional radiology nurse under my direct supervision. COMPLICATIONS: None immediate. TECHNIQUE:  Informed written consent was obtained from the patient after a thorough discussion of the procedural risks, benefits and alternatives. All questions were addressed. Maximal Sterile Barrier Technique was utilized including caps, mask, sterile gowns, sterile gloves, sterile drape, hand hygiene and skin antiseptic. A timeout was performed prior to the initiation of the procedure. PROCEDURE: The anterior pelvis was prepped with ChloraPrep in a sterile fashion, and a sterile drape was applied covering the operative field. A sterile gown and sterile gloves were used for the procedure. Local anesthesia was provided with 1% Lidocaine. Under CT guidance, an  18 gauge needle was advanced into the pelvic abscess via anterior approach. It was removed over an Amplatz wire. Ten [place] dilator followed by a 10 [place] drain were inserted. It was looped and string fixed then sewn to the skin. 15 cc pus was aspirated. FINDINGS: Imaging confirms 93 French pigtail catheter drain placement into a left pelvic abscess. IMPRESSION: Successful left 10 French pelvic abscess drain placement. Aspiration yielded 15 cc of pus. Electronically Signed   By: Marybelle Killings M.D.   On: 07/06/2018 12:35    Labs:  CBC: Recent Labs    06/20/18 1027 07/05/18 1112 07/06/18 0432 07/10/18 0414  WBC 8.0 12.6* 8.7 5.2  HGB 14.3 11.7* 11.2* 11.2*  HCT 43.8 35.9* 34.7* 34.0*  PLT 330 380 344 406*    COAGS: Recent Labs    05/31/18 0301  INR 1.04    BMP: Recent Labs    05/27/18 0531 05/28/18 0452 06/20/18 1027 07/05/18 1112 07/06/18 0432  NA 138 136  --  138 139  K 4.5 3.7  --  4.2 3.5  CL 106 104  --  101 106  CO2 27 25  --  26 26  GLUCOSE 197* 141*  --  145* 136*  BUN 19 12  --  14 9  CALCIUM 8.7* 8.3*  --  9.2 8.1*  CREATININE 0.65 0.56 0.71 0.49 0.62  GFRNONAA >60 >60 >60 >60 >60  GFRAA >60 >60 >60 >60 >60    LIVER FUNCTION TESTS: Recent Labs    05/26/18 2143  BILITOT 0.6  AST 19  ALT 20  ALKPHOS 90  PROT 7.5    ALBUMIN 4.2    TUMOR MARKERS: No results for input(s): AFPTM, CEA, CA199, CHROMGRNA in the last 8760 hours.  Assessment and Plan:  Her abscess has resolved but the drain injection study was positive for a fistula to adjacent colon.  The drain will remain in place and she will have a follow-up injection in 2 weeks.   Electronically Signed: Art A Baeleigh Devincent, MD 07/30/2018, 9:51 AM   I spent a total of 15 Minutes in face to face in clinical consultation, greater than 50% of which was counseling/coordinating care for abscess drain follow-up.

## 2018-07-30 NOTE — Progress Notes (Signed)
Dr. Georgeann Oppenheim office was contacted today and appointment was cancelled for now per Dr. Mont Dutton request.  Patient will follow up for repeat tube check and appointment with Dr. Hampton Abbot on 08-13-18 as scheduled.   The patient is aware of the above and verbalizes understanding.

## 2018-07-30 NOTE — Telephone Encounter (Signed)
I received a phone call from Estill Bamberg at [facility] stating they did not receive a prescription for a patient that was seen in the office earlier today and it presently at the pharmacy.   Dr. Hampton Abbot notified.   Estill Bamberg from the Pharmacy was given a verbal prescription for normal saline flush syringes 10 mL's. Directions: 5 mL's through drain twice daily #21 no refills.

## 2018-07-30 NOTE — Patient Instructions (Signed)
Follow up after scheduled drain recheck for 08/13/18.  You are scheduled for your follow up on 08/13/18 at 3:30 pm.  You may shower, keep drain area clean and dry.  Cancel you appointment with Dr Vicente Males for now.

## 2018-07-30 NOTE — Progress Notes (Signed)
07/30/2018  History of Present Illness: Linda Bradford is a 62 y.o. female with hx of diverticulitis with abscess.  She had a CT scan of pelvis today which revealed no further abscess.  Her drain study however shows that there is communication with sigmoid colon, with contrast going into the colon, consistent with fistula.  I have personally viewed the images and agree with the findings.  The patient reports that she's not having any further pain.  She does have some skin rash around the drain site from her prior dressing.  Past Medical History: Past Medical History:  Diagnosis Date  . Anxiety   . COPD (chronic obstructive pulmonary disease) ([place])   . Depression   . Diabetes mellitus without complication ([place])   . Fatigue   . Headache   . Hypertension   . Thyroid disease      Past Surgical History: Past Surgical History:  Procedure Laterality Date  . APPENDECTOMY    . TUBAL LIGATION      Home Medications: Prior to Admission medications   Medication Sig Start Date End Date Taking? Authorizing Provider  acetaminophen (TYLENOL) 500 MG tablet Take 1,000 mg by mouth every 6 (six) hours as needed for mild pain or moderate pain.    Yes [provider]  albuterol (PROVENTIL HFA;VENTOLIN HFA) 108 (90 Base) MCG/ACT inhaler Inhale 1 puff into the lungs every 4 (four) hours as needed. 08/04/17  Yes Mar Daring, PA-C  amoxicillin-clavulanate (AUGMENTIN) 875-125 MG tablet Take 1 tablet by mouth 2 (two) times daily for 14 days. 07/27/18 08/10/18 Yes Shaneisha Burkel, Jacqulyn Bath, MD  benzonatate (TESSALON) 200 MG capsule Take 1 capsule (200 mg total) by mouth 3 (three) times daily as needed for cough. 04/17/18  Yes Burnette, Jennifer M, PA-C  BREO ELLIPTA 200-25 MCG/INH AEPB INHALE 1 PUFF BY MOUTH ONCE DAILY Patient taking differently: Inhale 1 puff into the lungs daily.  01/25/18  Yes Burnette, Clearnce Sorrel, PA-C  clonazePAM (KLONOPIN) 0.5 MG tablet TAKE 1/2 TABLET BY MOUTH TWICE DAILY AS NEEDED  02/23/18  Yes Fenton Malling M, PA-C  Cyanocobalamin (RA VITAMIN B-12 TR) 1000 MCG TBCR Take 1,000 mcg by mouth daily.    Yes [provider]  diclofenac sodium (VOLTAREN) 1 % GEL Apply 4 g topically 4 (four) times daily as needed. 03/01/18  Yes Mar Daring, PA-C  DULoxetine (CYMBALTA) 60 MG capsule TAKE 1 CAPSULE BY MOUTH TWICE DAILY 02/23/18  Yes Mar Daring, PA-C  ergocalciferol (VITAMIN D2) 1.25 MG (50000 UT) capsule once a week.    Yes [provider]  ezetimibe (ZETIA) 10 MG tablet TAKE 1 TABLET BY MOUTH ONCE DAILY 06/20/18  Yes Fenton Malling M, PA-C  gabapentin (NEURONTIN) 300 MG capsule Take 300-1,200 mg by mouth See admin instructions. Take 1 capsule (300MG ) by mouth every morning and 4 capsules (1200MG ) by mouth every night at bedtime   Yes [provider]  glucose blood (CONTOUR NEXT TEST) test strip To check blood sugar once daily 06/09/17  Yes Burnette, Jennifer M, PA-C  HYDROcodone-acetaminophen (NORCO/VICODIN) 5-325 MG tablet Take 1 tablet by mouth every 6 (six) hours as needed for moderate pain or severe pain. 07/10/18  Yes Tylene Fantasia, PA-C  ipratropium-albuterol (DUONEB) 0.5-2.5 (3) MG/3ML SOLN Take 3 mLs by nebulization every 6 (six) hours as needed. 11/04/15  Yes Burnette, Clearnce Sorrel, PA-C  levothyroxine (SYNTHROID, LEVOTHROID) 75 MCG tablet TAKE 1 TABLET BY MOUTH ONCE DAILY ON AN EMPTY STOMACH. WAIT 30 MINUTES BEFORE TAKING OTHER MEDS.  Patient taking differently: Take 75 mcg by mouth daily.  02/23/18  Yes Mar Daring, PA-C  losartan (COZAAR) 50 MG tablet TAKE 1 TABLET BY MOUTH ONCE DAILY 10/26/17  Yes Mar Daring, PA-C  meclizine (ANTIVERT) 25 MG tablet Take 1 tablet (25 mg total) by mouth 3 (three) times daily as needed for dizziness. 05/15/18  Yes Burnette, Clearnce Sorrel, PA-C  meloxicam (MOBIC) 15 MG tablet TAKE 1 TABLET BY MOUTH ONCE DAILY. 03/28/18  Yes Fenton Malling M, PA-C  metFORMIN (GLUCOPHAGE-XR) 500  MG 24 hr tablet TAKE 2 TABLETS BY MOUTH ONCE DAILY WITH SUPPER 03/01/18  Yes Burnette, Jennifer M, PA-C  nicotine (NICODERM CQ - DOSED IN MG/24 HOURS) 21 mg/24hr patch Place 1 patch (21 mg total) onto the skin daily. 06/02/18  Yes Edison Simon R, PA-C  ondansetron (ZOFRAN) 4 MG tablet TAKE 1 TABLET BY MOUTH EVERY 8 HOURS AS NEEDED 07/17/18  Yes Fenton Malling M, PA-C  polyethylene glycol (MIRALAX / GLYCOLAX) packet Take 17 g by mouth daily. 06/02/18  Yes Edison Simon R, PA-C  pyridOXINE (VITAMIN B-6) 100 MG tablet Take 100 mg by mouth daily.   Yes [provider]  THEO-24 300 MG 24 hr capsule TAKE 1 CAPSULE BY MOUTH ONCE DAILY 06/11/18  Yes Burnette, Anderson Malta M, PA-C  tiZANidine (ZANAFLEX) 4 MG capsule TAKE 1 CAPSULE BY MOUTH 3 TIMES DAILY Patient taking differently: Take 4 mg by mouth at bedtime as needed for muscle spasms.  02/19/18  Yes Mar Daring, PA-C  Vitamin D, Ergocalciferol, (DRISDOL) 50000 units CAPS capsule TAKE 1 CAPSULE BY MOUTH EVERY 7 DAYS Patient taking differently: Take 50,000 Units by mouth every Tuesday.  12/01/17  Yes Mar Daring, PA-C    Allergies: Allergies  Allergen Reactions  . Dexilant [Dexlansoprazole] Nausea Only and Other (See Comments)    Dizziness    Review of Systems: Review of Systems  Constitutional: Negative for chills and fever.  Respiratory: Negative for shortness of breath.   Cardiovascular: Negative for chest pain.  Gastrointestinal: Negative for abdominal pain, nausea and vomiting.    Physical Exam BP 134/68   Pulse 89   Temp 97.9 F (36.6 C)   Ht 5\' 10"  (1.778 m)   Wt 207 lb 9.6 oz (94.2 kg)   SpO2 98%   BMI 29.79 kg/m  CONSTITUTIONAL: No acute distress HEENT:  Normocephalic, atraumatic, extraocular motion intact. RESPIRATORY:  Lungs are clear, and breath sounds are equal bilaterally. Normal respiratory effort without pathologic use of accessory muscles. CARDIOVASCULAR: Heart is regular without murmurs,  gallops, or rubs. GI: The abdomen is soft, non-distended, non-tender to palpation.  Drain in place with low volume serous fluid.  Mild rash around drain likely from some excoriation due to wetness.  NEUROLOGIC:  Motor and sensation is grossly normal.  Cranial nerves are grossly intact. PSYCH:  Alert and oriented to person, place and time. Affect is normal.  Labs/Imaging: CT pelvis 12/30: IMPRESSION: Left pelvic abscess has resolved after drain placement. Drain injection is to follow under fluoroscopy.  Findings related to diverticulitis of the sigmoid colon have Improved.  Drain study 12/30: FINDINGS: The abscess cavity has decompressed. There is a fistula to the adjacent sigmoid colon. Contrast fills the sigmoid colon.  IMPRESSION: The abscess has decompressed but the study is positive for a fistula to adjacent sigmoid colon. Repeat injection will be schedule for 2 weeks.  Assessment and Plan: This is a 62 y.o. female with diverticulitis with abscess  Discussed CT and drain  study results with the patient.  Abscess has resolved and her inflammation has significantly improved.  Her drain study however, shows that she has a fistula.  Discussed with the patient that the drain has to stay in place for now.  She will have a repeat drain study in two weeks. If a fistula is still present, she would have to keep the drain until surgery.  Given the fistula, we would also cancel her appointment with GI for colonoscopy as it would be contraindicated.  Discussed with her that the eventual goal of surgery is still there, but the question would be if she would have to keep drain in place until then, or if the drain will be able to come out and then plan for colonoscopy.  Patient understands the implications of both.  She may use neosporin and barrier ointment to help with the rash.  Now that the clip dressing is gone, she should be able to clean the skin around the drain better.  Follow up in two  weeks.  Face-to-face time spent with the patient and care providers was 15 minutes, with more than 50% of the time spent counseling, educating, and coordinating care of the patient.     Melvyn Neth, [facility]

## 2018-08-06 ENCOUNTER — Ambulatory Visit: Payer: BC Managed Care – PPO | Admitting: Gastroenterology

## 2018-08-07 ENCOUNTER — Other Ambulatory Visit: Payer: Self-pay | Admitting: Physician Assistant

## 2018-08-07 DIAGNOSIS — Z9109 Other allergy status, other than to drugs and biological substances: Secondary | ICD-10-CM

## 2018-08-08 ENCOUNTER — Ambulatory Visit: Admission: RE | Admit: 2018-08-08 | Payer: BC Managed Care – PPO | Source: Ambulatory Visit

## 2018-08-08 ENCOUNTER — Ambulatory Visit: Payer: BC Managed Care – PPO

## 2018-08-08 ENCOUNTER — Other Ambulatory Visit: Payer: Self-pay | Admitting: *Deleted

## 2018-08-08 MED ORDER — NYSTATIN 100000 UNIT/GM EX POWD: Freq: Two times a day (BID) | CUTANEOUS | 0 refills | Status: DC

## 2018-08-08 NOTE — Telephone Encounter (Signed)
Patient called and stated that at the bottom of her drain site, started last night red and itchy, yeast like symptoms, she was told by Dr.Pabon if this were to occur then to call the office to get a prescription called in for her.

## 2018-08-08 NOTE — Telephone Encounter (Signed)
Patient notified to pick up Nystatin powder at [facility].

## 2018-08-13 ENCOUNTER — Other Ambulatory Visit: Payer: Self-pay

## 2018-08-13 ENCOUNTER — Encounter: Payer: Self-pay | Admitting: Surgery

## 2018-08-13 ENCOUNTER — Ambulatory Visit (INDEPENDENT_AMBULATORY_CARE_PROVIDER_SITE_OTHER): Payer: BLUE CROSS/BLUE SHIELD | Admitting: Surgery

## 2018-08-13 ENCOUNTER — Ambulatory Visit
Admission: RE | Admit: 2018-08-13 | Discharge: 2018-08-13 | Disposition: A | Discharge status: Home / Self Care | Payer: BLUE CROSS/BLUE SHIELD | Source: Ambulatory Visit | Attending: Interventional Radiology | Admitting: Interventional Radiology

## 2018-08-13 VITALS — BP 158/84 | HR 78 | Temp 98.0°F | Ht 69.0 in | Wt 208.0 lb

## 2018-08-13 DIAGNOSIS — K5720 Diverticulitis of large intestine with perforation and abscess without bleeding: Secondary | ICD-10-CM

## 2018-08-13 DIAGNOSIS — K5732 Diverticulitis of large intestine without perforation or abscess without bleeding: Secondary | ICD-10-CM

## 2018-08-13 DIAGNOSIS — T85698A Other mechanical complication of other specified internal prosthetic devices, implants and grafts, initial encounter: Secondary | ICD-10-CM | POA: Diagnosis not present

## 2018-08-13 MED ORDER — IOPAMIDOL (ISOVUE-300) INJECTION 61%
20.0000 mL | Freq: Once | INTRAVENOUS | Status: AC | PRN
  Administered 2018-08-13: 20 mL

## 2018-08-13 NOTE — H&P (View-Only) (Signed)
08/13/2018  History of Present Illness: Linda Bradford is a 63 y.o. female with a history of diverticulitis with abscess.  She had a percutaneous drain placed on 07/06/18.  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  The abscess itself has resolved and on CT scan from 12/30, there was no further significant inflammation of the sigmoid.  She just had a drain study today and presents to discuss the results.  She reports that the drain is not having much output and denies any abdominal pain.  She did have burning sensation during the contrast injection and was told that she could stop flushing her drain.  Denies any fevers, chills, chest pain, shortness of breath.  She did have a cold recently but has recovered.  She does report a yeast rash at the drain insertion site and we prescribed her Nystatin powder last week.  Past Medical History: Past Medical History:  Diagnosis Date  . Anxiety   . COPD (chronic obstructive pulmonary disease) ([place])   . Depression   . Diabetes mellitus without complication ([place])   . Fatigue   . Headache   . Hypertension   . Thyroid disease      Past Surgical History: Past Surgical History:  Procedure Laterality Date  . APPENDECTOMY    . TUBAL LIGATION      Home Medications: Prior to Admission medications   Medication Sig Start Date End Date Taking? Authorizing Provider  acetaminophen (TYLENOL) 500 MG tablet Take 1,000 mg by mouth every 6 (six) hours as needed for mild pain or moderate pain.    Yes [provider]  albuterol (PROVENTIL HFA;VENTOLIN HFA) 108 (90 Base) MCG/ACT inhaler Inhale 1 puff into the lungs every 4 (four) hours as needed. 08/04/17  Yes Mar Daring, PA-C  benzonatate (TESSALON) 200 MG capsule Take 1 capsule (200 mg total) by mouth 3 (three) times daily as needed for cough. 04/17/18  Yes Burnette, Jennifer M, PA-C  BREO ELLIPTA 200-25 MCG/INH AEPB INHALE 1 PUFF  BY MOUTH ONCE DAILY Patient taking differently: Inhale 1 puff into the lungs daily.  01/25/18  Yes Burnette, Clearnce Sorrel, PA-C  clonazePAM (KLONOPIN) 0.5 MG tablet TAKE 1/2 TABLET BY MOUTH TWICE DAILY AS NEEDED 02/23/18  Yes Fenton Malling M, PA-C  Cyanocobalamin (RA VITAMIN B-12 TR) 1000 MCG TBCR Take 1,000 mcg by mouth daily.    Yes [provider]  diclofenac sodium (VOLTAREN) 1 % GEL Apply 4 g topically 4 (four) times daily as needed. 03/01/18  Yes Mar Daring, PA-C  DULoxetine (CYMBALTA) 60 MG capsule TAKE 1 CAPSULE BY MOUTH TWICE DAILY 02/23/18  Yes Mar Daring, PA-C  ergocalciferol (VITAMIN D2) 1.25 MG (50000 UT) capsule once a week.    Yes [provider]  ezetimibe (ZETIA) 10 MG tablet TAKE 1 TABLET BY MOUTH ONCE DAILY 06/20/18  Yes Fenton Malling M, PA-C  fluticasone [facility]) 50 MCG/ACT nasal spray USE 2 SPRAYS IN EACH NOSTRIL ONCE DAILY 08/07/18  Yes Fenton Malling M, PA-C  gabapentin (NEURONTIN) 300 MG capsule Take 300-1,200 mg by mouth See admin instructions. Take 1 capsule (300MG ) by mouth every morning and 4 capsules (1200MG ) by mouth every night at bedtime   Yes [provider]  glucose blood (CONTOUR NEXT TEST) test strip To check blood sugar once daily 06/09/17  Yes Burnette, Jennifer M, PA-C  HYDROcodone-acetaminophen (NORCO/VICODIN) 5-325 MG tablet Take 1 tablet by mouth every 6 (  six) hours as needed for moderate pain or severe pain. 07/10/18  Yes Tylene Fantasia, PA-C  ipratropium-albuterol (DUONEB) 0.5-2.5 (3) MG/3ML SOLN Take 3 mLs by nebulization every 6 (six) hours as needed. 11/04/15  Yes Burnette, Clearnce Sorrel, PA-C  levothyroxine (SYNTHROID, LEVOTHROID) 75 MCG tablet TAKE 1 TABLET BY MOUTH ONCE DAILY ON AN EMPTY STOMACH. WAIT 30 MINUTES BEFORE TAKING OTHER MEDS. Patient taking differently: Take 75 mcg by mouth daily.  02/23/18  Yes Mar Daring, PA-C  losartan (COZAAR) 50 MG tablet TAKE 1 TABLET BY MOUTH ONCE DAILY  10/26/17  Yes Mar Daring, PA-C  meclizine (ANTIVERT) 25 MG tablet Take 1 tablet (25 mg total) by mouth 3 (three) times daily as needed for dizziness. 05/15/18  Yes Burnette, Clearnce Sorrel, PA-C  meloxicam (MOBIC) 15 MG tablet TAKE 1 TABLET BY MOUTH ONCE DAILY. 03/28/18  Yes Fenton Malling M, PA-C  metFORMIN (GLUCOPHAGE-XR) 500 MG 24 hr tablet TAKE 2 TABLETS BY MOUTH ONCE DAILY WITH SUPPER 03/01/18  Yes Burnette, Jennifer M, PA-C  nicotine (NICODERM CQ - DOSED IN MG/24 HOURS) 21 mg/24hr patch Place 1 patch (21 mg total) onto the skin daily. 06/02/18  Yes Edison Simon R, PA-C  nystatin (NYSTATIN) powder Apply topically 2 (two) times daily. 08/08/18  Yes Davi Kroon, Jacqulyn Bath, MD  ondansetron (ZOFRAN) 4 MG tablet TAKE 1 TABLET BY MOUTH EVERY 8 HOURS AS NEEDED 07/17/18  Yes Burnette, Anderson Malta M, PA-C  polyethylene glycol (MIRALAX / GLYCOLAX) packet Take 17 g by mouth daily. 06/02/18  Yes Edison Simon R, PA-C  pyridOXINE (VITAMIN B-6) 100 MG tablet Take 100 mg by mouth daily.   Yes [provider]  THEO-24 300 MG 24 hr capsule TAKE 1 CAPSULE BY MOUTH ONCE DAILY 06/11/18  Yes Burnette, Anderson Malta M, PA-C  tiZANidine (ZANAFLEX) 4 MG capsule TAKE 1 CAPSULE BY MOUTH 3 TIMES DAILY Patient taking differently: Take 4 mg by mouth at bedtime as needed for muscle spasms.  02/19/18  Yes Mar Daring, PA-C  Vitamin D, Ergocalciferol, (DRISDOL) 50000 units CAPS capsule TAKE 1 CAPSULE BY MOUTH EVERY 7 DAYS Patient taking differently: Take 50,000 Units by mouth every Tuesday.  12/01/17  Yes Mar Daring, PA-C    Allergies: Allergies  Allergen Reactions  . Dexilant [Dexlansoprazole] Nausea Only and Other (See Comments)    Dizziness    Review of Systems: Review of Systems  Constitutional: Negative for chills and fever.  Respiratory: Negative for shortness of breath.   Cardiovascular: Negative for chest pain.  Gastrointestinal: Negative for abdominal pain, diarrhea, nausea and vomiting.   Skin: Positive for rash (rash at the drain insertion site).    Physical Exam BP (!) 158/84   Pulse 78   Temp 98 F (36.7 C) (Skin)   Ht 5\' 9"  (1.753 m)   Wt 208 lb (94.3 kg)   SpO2 98%   BMI 30.72 kg/m  CONSTITUTIONAL: No acute distress HEENT:  Normocephalic, atraumatic, extraocular motion intact. RESPIRATORY:  Lungs are clear, and breath sounds are equal bilaterally. Normal respiratory effort without pathologic use of accessory muscles. CARDIOVASCULAR: Heart is regular without murmurs, gallops, or rubs. GI: The abdomen is soft, non-distended, non-tender to palpation. Percutaneous drain in place, with no drainage in the bulb. Suture in place without any bleeding. There were no palpable masses. There was no hepatosplenomegaly. NEUROLOGIC:  Motor and sensation is grossly normal.  Cranial nerves are grossly intact. PSYCH:  Alert and oriented to person, place and time. Affect is normal.  Labs/Imaging: Drain  study 08/13/18: FINDINGS: Left lower quadrant drain is in stable position. Distal portion of the pigtail drain is occluded. Some sideholes remain open in the proximal portion of the pigtail with persistent thin fistula demonstrated to the adjacent sigmoid colon. Contrast enters the sigmoid colon lumen. No residual abscess cavity is identified.  IMPRESSION: Persistent thin fistula demonstrated with contrast injection from the drainage catheter site to the adjacent sigmoid colonic lumen.  Assessment and Plan: This is a 63 y.o. female with diverticulitis with abscess, s/p percutaneous drain, now with persistent fistula.  Discussed with the patient that given the persistence, we should proceed with surgery.  Discussed with her that at least clinically and on her CT scan, she does not have any further inflammation and the abscess cavity has resolved.  With this in mind, there is a better chance to be able to create a primary colorectal anastomosis without need for colostomy or loop  ileostomy (compared to if she were still actively having diverticulitis) but we cannot guarantee it.  Discussed with her that the goal would be to do a laparoscopic sigmoidectomy with anastomosis, but that we may need an open procedure, and we may need an ostomy.  Discussed with her that I would like her to get ureteral stents in the OR with Urology as well to help [place] identify the ureters better intraoperatively.  Also discussed with her that we would want her to get clearance with her PCP for surgery.  She does smoke and has HTN and diabetes.  Her last A1c was 7.2.  She smokes about a pack per day, but she wants to quit and it's her goal to quit by surgery date. She has chantix already and a patch.  Discussed with her the miralax bowel prep as well as erythromycin and neomycin preps before surgery.  Currently we'll plan on 09/03/18 for date of surgery as long as all teams involved are available.  All questions were answered.  Face-to-face time spent with the patient and care providers was 25 minutes, with more than 50% of the time spent counseling, educating, and coordinating care of the patient.     Melvyn Neth, [facility]

## 2018-08-13 NOTE — Progress Notes (Signed)
08/13/2018  History of Present Illness: Linda Bradford is a 63 y.o. female with a history of diverticulitis with abscess.  She had a percutaneous drain placed on 07/06/18.  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  The abscess itself has resolved and on CT scan from 12/30, there was no further significant inflammation of the sigmoid.  She just had a drain study today and presents to discuss the results.  She reports that the drain is not having much output and denies any abdominal pain.  She did have burning sensation during the contrast injection and was told that she could stop flushing her drain.  Denies any fevers, chills, chest pain, shortness of breath.  She did have a cold recently but has recovered.  She does report a yeast rash at the drain insertion site and we prescribed her Nystatin powder last week.  Past Medical History: Past Medical History:  Diagnosis Date  . Anxiety   . COPD (chronic obstructive pulmonary disease) ([place])   . Depression   . Diabetes mellitus without complication ([place])   . Fatigue   . Headache   . Hypertension   . Thyroid disease      Past Surgical History: Past Surgical History:  Procedure Laterality Date  . APPENDECTOMY    . TUBAL LIGATION      Home Medications: Prior to Admission medications   Medication Sig Start Date End Date Taking? Authorizing Provider  acetaminophen (TYLENOL) 500 MG tablet Take 1,000 mg by mouth every 6 (six) hours as needed for mild pain or moderate pain.    Yes [provider]  albuterol (PROVENTIL HFA;VENTOLIN HFA) 108 (90 Base) MCG/ACT inhaler Inhale 1 puff into the lungs every 4 (four) hours as needed. 08/04/17  Yes Mar Daring, PA-C  benzonatate (TESSALON) 200 MG capsule Take 1 capsule (200 mg total) by mouth 3 (three) times daily as needed for cough. 04/17/18  Yes Burnette, Jennifer M, PA-C  BREO ELLIPTA 200-25 MCG/INH AEPB INHALE 1 PUFF  BY MOUTH ONCE DAILY Patient taking differently: Inhale 1 puff into the lungs daily.  01/25/18  Yes Burnette, Clearnce Sorrel, PA-C  clonazePAM (KLONOPIN) 0.5 MG tablet TAKE 1/2 TABLET BY MOUTH TWICE DAILY AS NEEDED 02/23/18  Yes Fenton Malling M, PA-C  Cyanocobalamin (RA VITAMIN B-12 TR) 1000 MCG TBCR Take 1,000 mcg by mouth daily.    Yes [provider]  diclofenac sodium (VOLTAREN) 1 % GEL Apply 4 g topically 4 (four) times daily as needed. 03/01/18  Yes Mar Daring, PA-C  DULoxetine (CYMBALTA) 60 MG capsule TAKE 1 CAPSULE BY MOUTH TWICE DAILY 02/23/18  Yes Mar Daring, PA-C  ergocalciferol (VITAMIN D2) 1.25 MG (50000 UT) capsule once a week.    Yes [provider]  ezetimibe (ZETIA) 10 MG tablet TAKE 1 TABLET BY MOUTH ONCE DAILY 06/20/18  Yes Fenton Malling M, PA-C  fluticasone [facility]) 50 MCG/ACT nasal spray USE 2 SPRAYS IN EACH NOSTRIL ONCE DAILY 08/07/18  Yes Fenton Malling M, PA-C  gabapentin (NEURONTIN) 300 MG capsule Take 300-1,200 mg by mouth See admin instructions. Take 1 capsule (300MG ) by mouth every morning and 4 capsules (1200MG ) by mouth every night at bedtime   Yes [provider]  glucose blood (CONTOUR NEXT TEST) test strip To check blood sugar once daily 06/09/17  Yes Burnette, Jennifer M, PA-C  HYDROcodone-acetaminophen (NORCO/VICODIN) 5-325 MG tablet Take 1 tablet by mouth every 6 (  six) hours as needed for moderate pain or severe pain. 07/10/18  Yes Tylene Fantasia, PA-C  ipratropium-albuterol (DUONEB) 0.5-2.5 (3) MG/3ML SOLN Take 3 mLs by nebulization every 6 (six) hours as needed. 11/04/15  Yes Burnette, Clearnce Sorrel, PA-C  levothyroxine (SYNTHROID, LEVOTHROID) 75 MCG tablet TAKE 1 TABLET BY MOUTH ONCE DAILY ON AN EMPTY STOMACH. WAIT 30 MINUTES BEFORE TAKING OTHER MEDS. Patient taking differently: Take 75 mcg by mouth daily.  02/23/18  Yes Mar Daring, PA-C  losartan (COZAAR) 50 MG tablet TAKE 1 TABLET BY MOUTH ONCE DAILY  10/26/17  Yes Mar Daring, PA-C  meclizine (ANTIVERT) 25 MG tablet Take 1 tablet (25 mg total) by mouth 3 (three) times daily as needed for dizziness. 05/15/18  Yes Burnette, Clearnce Sorrel, PA-C  meloxicam (MOBIC) 15 MG tablet TAKE 1 TABLET BY MOUTH ONCE DAILY. 03/28/18  Yes Fenton Malling M, PA-C  metFORMIN (GLUCOPHAGE-XR) 500 MG 24 hr tablet TAKE 2 TABLETS BY MOUTH ONCE DAILY WITH SUPPER 03/01/18  Yes Burnette, Jennifer M, PA-C  nicotine (NICODERM CQ - DOSED IN MG/24 HOURS) 21 mg/24hr patch Place 1 patch (21 mg total) onto the skin daily. 06/02/18  Yes Edison Simon R, PA-C  nystatin (NYSTATIN) powder Apply topically 2 (two) times daily. 08/08/18  Yes Dreydon Cardenas, Jacqulyn Bath, MD  ondansetron (ZOFRAN) 4 MG tablet TAKE 1 TABLET BY MOUTH EVERY 8 HOURS AS NEEDED 07/17/18  Yes Burnette, Anderson Malta M, PA-C  polyethylene glycol (MIRALAX / GLYCOLAX) packet Take 17 g by mouth daily. 06/02/18  Yes Edison Simon R, PA-C  pyridOXINE (VITAMIN B-6) 100 MG tablet Take 100 mg by mouth daily.   Yes [provider]  THEO-24 300 MG 24 hr capsule TAKE 1 CAPSULE BY MOUTH ONCE DAILY 06/11/18  Yes Burnette, Anderson Malta M, PA-C  tiZANidine (ZANAFLEX) 4 MG capsule TAKE 1 CAPSULE BY MOUTH 3 TIMES DAILY Patient taking differently: Take 4 mg by mouth at bedtime as needed for muscle spasms.  02/19/18  Yes Mar Daring, PA-C  Vitamin D, Ergocalciferol, (DRISDOL) 50000 units CAPS capsule TAKE 1 CAPSULE BY MOUTH EVERY 7 DAYS Patient taking differently: Take 50,000 Units by mouth every Tuesday.  12/01/17  Yes Mar Daring, PA-C    Allergies: Allergies  Allergen Reactions  . Dexilant [Dexlansoprazole] Nausea Only and Other (See Comments)    Dizziness    Review of Systems: Review of Systems  Constitutional: Negative for chills and fever.  Respiratory: Negative for shortness of breath.   Cardiovascular: Negative for chest pain.  Gastrointestinal: Negative for abdominal pain, diarrhea, nausea and vomiting.   Skin: Positive for rash (rash at the drain insertion site).    Physical Exam BP (!) 158/84   Pulse 78   Temp 98 F (36.7 C) (Skin)   Ht 5\' 9"  (1.753 m)   Wt 208 lb (94.3 kg)   SpO2 98%   BMI 30.72 kg/m  CONSTITUTIONAL: No acute distress HEENT:  Normocephalic, atraumatic, extraocular motion intact. RESPIRATORY:  Lungs are clear, and breath sounds are equal bilaterally. Normal respiratory effort without pathologic use of accessory muscles. CARDIOVASCULAR: Heart is regular without murmurs, gallops, or rubs. GI: The abdomen is soft, non-distended, non-tender to palpation. Percutaneous drain in place, with no drainage in the bulb. Suture in place without any bleeding. There were no palpable masses. There was no hepatosplenomegaly. NEUROLOGIC:  Motor and sensation is grossly normal.  Cranial nerves are grossly intact. PSYCH:  Alert and oriented to person, place and time. Affect is normal.  Labs/Imaging: Drain  study 08/13/18: FINDINGS: Left lower quadrant drain is in stable position. Distal portion of the pigtail drain is occluded. Some sideholes remain open in the proximal portion of the pigtail with persistent thin fistula demonstrated to the adjacent sigmoid colon. Contrast enters the sigmoid colon lumen. No residual abscess cavity is identified.  IMPRESSION: Persistent thin fistula demonstrated with contrast injection from the drainage catheter site to the adjacent sigmoid colonic lumen.  Assessment and Plan: This is a 63 y.o. female with diverticulitis with abscess, s/p percutaneous drain, now with persistent fistula.  Discussed with the patient that given the persistence, we should proceed with surgery.  Discussed with her that at least clinically and on her CT scan, she does not have any further inflammation and the abscess cavity has resolved.  With this in mind, there is a better chance to be able to create a primary colorectal anastomosis without need for colostomy or loop  ileostomy (compared to if she were still actively having diverticulitis) but we cannot guarantee it.  Discussed with her that the goal would be to do a laparoscopic sigmoidectomy with anastomosis, but that we may need an open procedure, and we may need an ostomy.  Discussed with her that I would like her to get ureteral stents in the OR with Urology as well to help [place] identify the ureters better intraoperatively.  Also discussed with her that we would want her to get clearance with her PCP for surgery.  She does smoke and has HTN and diabetes.  Her last A1c was 7.2.  She smokes about a pack per day, but she wants to quit and it's her goal to quit by surgery date. She has chantix already and a patch.  Discussed with her the miralax bowel prep as well as erythromycin and neomycin preps before surgery.  Currently we'll plan on 09/03/18 for date of surgery as long as all teams involved are available.  All questions were answered.  Face-to-face time spent with the patient and care providers was 25 minutes, with more than 50% of the time spent counseling, educating, and coordinating care of the patient.     Melvyn Neth, [facility]

## 2018-08-14 ENCOUNTER — Telehealth: Payer: Self-pay | Admitting: *Deleted

## 2018-08-14 ENCOUNTER — Ambulatory Visit: Payer: BLUE CROSS/BLUE SHIELD | Admitting: Surgery

## 2018-08-14 NOTE — Telephone Encounter (Signed)
Patient called and needs a work note stating that it was okay for her to return back to work on 08/06/18

## 2018-08-14 NOTE — Telephone Encounter (Signed)
A work note was in the system from 07-20-18, reprinted. Can we fax or is she picking it up?

## 2018-08-15 ENCOUNTER — Other Ambulatory Visit: Payer: Self-pay | Admitting: Radiology

## 2018-08-15 ENCOUNTER — Encounter: Payer: Self-pay | Admitting: *Deleted

## 2018-08-15 ENCOUNTER — Telehealth: Payer: Self-pay

## 2018-08-15 NOTE — Telephone Encounter (Signed)
I did. We are contacting patient to come for pre op clearance. She needs EKG and CXR

## 2018-08-15 NOTE — Progress Notes (Addendum)
Patient stopped by the office this morning.   We reviewed plans for surgery on 09-03-18 with Dr. Hampton Abbot. Dr. Nestor Lewandowsky to assist with this case. *Dr. Erlene Quan from [facility] to place ureteral lighted stents.   *Patient is also aware we will need medical clearance from her PCP prior to surgery. A request for medical clearance was faxed on Monday to Fenton Malling, PA at [facility] Elena at [facility] to check and see if this was received.   Patient aware bowel prep medications were called in to [facility] as requested. Bowel prep instructions previuosly reviewed with the patient at her office visit on 08-13-18.   The patient is aware to Pre-Admit on 08-28-18 at 1 pm . Patient will check in at the [place], [address] (first floor). Caryl Pina from Pre-admit is aware to arrange to have [place] nurse come by.   The patient is aware to call the office should she have further questions.

## 2018-08-15 NOTE — Telephone Encounter (Signed)
Sharyn Lull from [facility] called asking if you got the surgical clearance request from them Call back number is [phone]

## 2018-08-15 NOTE — Telephone Encounter (Signed)
Patient has been scheduled for pre-op appt on 08/17/2018.

## 2018-08-17 ENCOUNTER — Ambulatory Visit (INDEPENDENT_AMBULATORY_CARE_PROVIDER_SITE_OTHER): Payer: BLUE CROSS/BLUE SHIELD | Admitting: Physician Assistant

## 2018-08-17 ENCOUNTER — Encounter: Payer: Self-pay | Admitting: Physician Assistant

## 2018-08-17 VITALS — BP 124/70 | HR 96 | Temp 97.5°F | Resp 16 | Ht 69.0 in | Wt 209.0 lb

## 2018-08-17 DIAGNOSIS — E119 Type 2 diabetes mellitus without complications: Secondary | ICD-10-CM | POA: Diagnosis not present

## 2018-08-17 DIAGNOSIS — Z716 Tobacco abuse counseling: Secondary | ICD-10-CM | POA: Diagnosis not present

## 2018-08-17 DIAGNOSIS — Z01818 Encounter for other preprocedural examination: Secondary | ICD-10-CM

## 2018-08-17 DIAGNOSIS — J418 Mixed simple and mucopurulent chronic bronchitis: Secondary | ICD-10-CM | POA: Diagnosis not present

## 2018-08-17 DIAGNOSIS — R110 Nausea: Secondary | ICD-10-CM

## 2018-08-17 LAB — POCT GLYCOSYLATED HEMOGLOBIN (HGB A1C): Hemoglobin A1C: 6.5 % — AB (ref 4.0–5.6)

## 2018-08-17 MED ORDER — ONDANSETRON HCL 4 MG PO TABS: 4.0000 mg | ORAL_TABLET | Freq: Three times a day (TID) | ORAL | 1 refills | Status: DC | PRN

## 2018-08-17 MED ORDER — NICOTINE 21 MG/24HR TD PT24: 21.0000 mg | MEDICATED_PATCH | Freq: Every day | TRANSDERMAL | 0 refills | Status: DC

## 2018-08-17 NOTE — Progress Notes (Signed)
Patient: Linda Bradford Female    DOB: 08-Feb-1956   63 y.o.   MRN: [id number] Visit Date: 08/17/2018  Today's Provider: Mar Daring, PA-C   Chief Complaint  Patient presents with  . Pre-op Exam   Subjective:     HPI   Patient is here for medical clearance. Patient is having a laparoscopic sigmoidectomy on 09/03/2018 for diverticulitis with Dr. Hampton Abbot. She does have known COPD but has not had issues with anesthesia, however, it has been a while since her last surgery. She reports as of today she has not smoked in last 2 days. Patient has been compliant with inhalers and nebulizer treatments to optimize her lung function.   Allergies  Allergen Reactions  . Dexilant [Dexlansoprazole] Nausea Only and Other (See Comments)    Dizziness     Current Outpatient Medications:  .  acetaminophen (TYLENOL) 500 MG tablet, Take 1,000 mg by mouth every 6 (six) hours as needed for mild pain or moderate pain. , Disp: , Rfl:  .  albuterol (PROVENTIL HFA;VENTOLIN HFA) 108 (90 Base) MCG/ACT inhaler, Inhale 1 puff into the lungs every 4 (four) hours as needed., Disp: 1 Inhaler, Rfl: 11 .  benzonatate (TESSALON) 200 MG capsule, Take 1 capsule (200 mg total) by mouth 3 (three) times daily as needed for cough., Disp: 90 capsule, Rfl: 3 .  BREO ELLIPTA 200-25 MCG/INH AEPB, INHALE 1 PUFF BY MOUTH ONCE DAILY (Patient taking differently: Inhale 1 puff into the lungs daily. ), Disp: 60 each, Rfl: 5 .  clonazePAM (KLONOPIN) 0.5 MG tablet, TAKE 1/2 TABLET BY MOUTH TWICE DAILY AS NEEDED, Disp: 180 tablet, Rfl: 1 .  Cyanocobalamin (RA VITAMIN B-12 TR) 1000 MCG TBCR, Take 1,000 mcg by mouth daily. , Disp: , Rfl:  .  diclofenac sodium (VOLTAREN) 1 % GEL, Apply 4 g topically 4 (four) times daily as needed., Disp: 100 g, Rfl: 1 .  DULoxetine (CYMBALTA) 60 MG capsule, TAKE 1 CAPSULE BY MOUTH TWICE DAILY, Disp: 180 capsule, Rfl: 1 .  ergocalciferol (VITAMIN D2) 1.25 MG (50000 UT) capsule, once a week. , Disp: ,  Rfl:  .  ezetimibe (ZETIA) 10 MG tablet, TAKE 1 TABLET BY MOUTH ONCE DAILY, Disp: 90 tablet, Rfl: 1 .  fluticasone (FLONASE) 50 MCG/ACT nasal spray, USE 2 SPRAYS IN EACH NOSTRIL ONCE DAILY, Disp: 48 g, Rfl: 1 .  gabapentin (NEURONTIN) 300 MG capsule, Take 300-1,200 mg by mouth See admin instructions. Take 1 capsule (300MG ) by mouth every morning and 4 capsules (1200MG ) by mouth every night at bedtime, Disp: , Rfl:  .  glucose blood (CONTOUR NEXT TEST) test strip, To check blood sugar once daily, Disp: 100 each, Rfl: 12 .  HYDROcodone-acetaminophen (NORCO/VICODIN) 5-325 MG tablet, Take 1 tablet by mouth every 6 (six) hours as needed for moderate pain or severe pain., Disp: 30 tablet, Rfl: 0 .  ipratropium-albuterol (DUONEB) 0.5-2.5 (3) MG/3ML SOLN, Take 3 mLs by nebulization every 6 (six) hours as needed., Disp: 360 mL, Rfl: 11 .  levothyroxine (SYNTHROID, LEVOTHROID) 75 MCG tablet, TAKE 1 TABLET BY MOUTH ONCE DAILY ON AN EMPTY STOMACH. WAIT 30 MINUTES BEFORE TAKING OTHER MEDS. (Patient taking differently: Take 75 mcg by mouth daily. ), Disp: 90 tablet, Rfl: 1 .  losartan (COZAAR) 50 MG tablet, TAKE 1 TABLET BY MOUTH ONCE DAILY, Disp: 90 tablet, Rfl: 2 .  meclizine (ANTIVERT) 25 MG tablet, Take 1 tablet (25 mg total) by mouth 3 (three) times daily as needed for  dizziness., Disp: 30 tablet, Rfl: 0 .  meloxicam (MOBIC) 15 MG tablet, TAKE 1 TABLET BY MOUTH ONCE DAILY., Disp: 90 tablet, Rfl: 1 .  metFORMIN (GLUCOPHAGE-XR) 500 MG 24 hr tablet, TAKE 2 TABLETS BY MOUTH ONCE DAILY WITH SUPPER, Disp: 180 tablet, Rfl: 1 .  nicotine (NICODERM CQ - DOSED IN MG/24 HOURS) 21 mg/24hr patch, Place 1 patch (21 mg total) onto the skin daily., Disp: 28 patch, Rfl: 0 .  nystatin (NYSTATIN) powder, Apply topically 2 (two) times daily., Disp: 15 g, Rfl: 0 .  ondansetron (ZOFRAN) 4 MG tablet, TAKE 1 TABLET BY MOUTH EVERY 8 HOURS AS NEEDED, Disp: 20 tablet, Rfl: 0 .  polyethylene glycol (MIRALAX / GLYCOLAX) packet, Take 17 g  by mouth daily., Disp: 14 each, Rfl: 1 .  pyridOXINE (VITAMIN B-6) 100 MG tablet, Take 100 mg by mouth daily., Disp: , Rfl:  .  THEO-24 300 MG 24 hr capsule, TAKE 1 CAPSULE BY MOUTH ONCE DAILY, Disp: 90 capsule, Rfl: 1 .  tiZANidine (ZANAFLEX) 4 MG capsule, TAKE 1 CAPSULE BY MOUTH 3 TIMES DAILY (Patient taking differently: Take 4 mg by mouth at bedtime as needed for muscle spasms. ), Disp: 90 capsule, Rfl: 6 .  Vitamin D, Ergocalciferol, (DRISDOL) 50000 units CAPS capsule, TAKE 1 CAPSULE BY MOUTH EVERY 7 DAYS (Patient taking differently: Take 50,000 Units by mouth every Tuesday. ), Disp: 12 capsule, Rfl: 3  Review of Systems  Constitutional: Negative for appetite change, chills, fatigue and fever.  Respiratory: Negative for chest tightness and shortness of breath.   Cardiovascular: Negative for chest pain and palpitations.  Gastrointestinal: Negative for abdominal pain, nausea and vomiting.  Neurological: Negative for dizziness and weakness.    Social History   Tobacco Use  . Smoking status: Current Every Day Smoker    Packs/day: 1.00    Years: 45.00    Pack years: 45.00    Types: Cigarettes    Start date: 03/02/1980  . Smokeless tobacco: Never Used  . Tobacco comment: every other day  Substance Use Topics  . Alcohol use: No    Alcohol/week: 0.0 standard drinks      Objective:   There were no vitals taken for this visit. There were no vitals filed for this visit.   Physical Exam Vitals signs reviewed.  Constitutional:      General: She is not in acute distress.    Appearance: She is well-developed. She is not diaphoretic.  Neck:     Musculoskeletal: Normal range of motion and neck supple.     Thyroid: No thyromegaly.     Vascular: No JVD.     Trachea: No tracheal deviation.  Cardiovascular:     Rate and Rhythm: Normal rate and regular rhythm.     Heart sounds: Normal heart sounds. No murmur. No friction rub. No gallop.   Pulmonary:     Effort: Pulmonary effort is  normal. No respiratory distress.     Breath sounds: Normal breath sounds. No wheezing or rales.  Musculoskeletal:     Right lower leg: No edema.     Left lower leg: No edema.  Lymphadenopathy:     Cervical: No cervical adenopathy.        Assessment & Plan    1. Pre-op exam EKG shows NSR rate of 82. No St changes noted. Chest CT from 05/2018 reviewed and printed to go with pre op form. Has had general anesthesia previously without complications. Does have severe COPD but is trying to quit  smoking. COPD does increase her surgical risk slightly. Her [place] Safe surgical risk was 0.2% and she was a class 3 on the revised cardiac risk index calculators. She is optimized to the best of our abilities and will continue to improve if we can continue smoking cessation.  - EKG 12-Lead  2. Mixed simple and mucopurulent chronic bronchitis ([place]) See above medical treatment plan.  3. Diabetes mellitus without complication (HCC) L8V down to 6.5 from 7.5.  - POCT HgB A1C  4. Encounter for smoking cessation counseling Nicotine patches refilled. Has not smoked for 2 days.  - nicotine (NICODERM CQ - DOSED IN MG/24 HOURS) 21 mg/24hr patch; Place 1 patch (21 mg total) onto the skin daily.  Dispense: 56 patch; Refill: 0  5. Nausea Stable. Diagnosis pulled for medication refill. Continue current medical treatment plan. - ondansetron (ZOFRAN) 4 MG tablet; Take 1 tablet (4 mg total) by mouth every 8 (eight) hours as needed.  Dispense: 20 tablet; Refill: [place], PA-C  [facility]

## 2018-08-20 ENCOUNTER — Telehealth: Payer: Self-pay | Admitting: *Deleted

## 2018-08-20 NOTE — Telephone Encounter (Signed)
Patient called the office back and was notified that we received medical clearance.   The patient was instructed to call the office should she have further questions.

## 2018-08-20 NOTE — Telephone Encounter (Signed)
Message left for patient to call the office.   Just need to inform patient that we did received medical clearance from [facility], PA-C. She has cleared patient at medium risk for surgery.

## 2018-08-21 ENCOUNTER — Other Ambulatory Visit: Payer: Self-pay | Admitting: Physician Assistant

## 2018-08-21 DIAGNOSIS — E039 Hypothyroidism, unspecified: Secondary | ICD-10-CM

## 2018-08-25 ENCOUNTER — Other Ambulatory Visit: Payer: Self-pay | Admitting: Physician Assistant

## 2018-08-25 DIAGNOSIS — G629 Polyneuropathy, unspecified: Secondary | ICD-10-CM

## 2018-08-25 DIAGNOSIS — F329 Major depressive disorder, single episode, unspecified: Secondary | ICD-10-CM

## 2018-08-25 DIAGNOSIS — F32A Depression, unspecified: Secondary | ICD-10-CM

## 2018-08-28 ENCOUNTER — Encounter
Admission: RE | Admit: 2018-08-28 | Discharge: 2018-08-28 | Disposition: A | Discharge status: Home / Self Care | Payer: BLUE CROSS/BLUE SHIELD | Source: Ambulatory Visit | Attending: Surgery | Admitting: Surgery

## 2018-08-28 ENCOUNTER — Other Ambulatory Visit: Payer: Self-pay

## 2018-08-28 DIAGNOSIS — Z01812 Encounter for preprocedural laboratory examination: Secondary | ICD-10-CM | POA: Insufficient documentation

## 2018-08-28 HISTORY — DX: Cardiac arrhythmia, unspecified: I49.9

## 2018-08-28 HISTORY — DX: Malignant (primary) neoplasm, unspecified: C80.1

## 2018-08-28 HISTORY — DX: Unspecified osteoarthritis, unspecified site: M19.90

## 2018-08-28 HISTORY — DX: Gastro-esophageal reflux disease without esophagitis: K21.9

## 2018-08-28 HISTORY — DX: Hypothyroidism, unspecified: E03.9

## 2018-08-28 LAB — CBC WITH DIFFERENTIAL/PLATELET
Abs Immature Granulocytes: 0.01 10*3/uL (ref 0.00–0.07)
Basophils Absolute: 0.1 10*3/uL (ref 0.0–0.1)
Basophils Relative: 1 %
Eosinophils Absolute: 0.2 10*3/uL (ref 0.0–0.5)
Eosinophils Relative: 4 %
HCT: 41.9 % (ref 36.0–46.0)
Hemoglobin: 13.5 g/dL (ref 12.0–15.0)
Immature Granulocytes: 0 %
Lymphocytes Relative: 39 %
Lymphs Abs: 2.3 10*3/uL (ref 0.7–4.0)
MCH: 29.5 pg (ref 26.0–34.0)
MCHC: 32.2 g/dL (ref 30.0–36.0)
MCV: 91.5 fL (ref 80.0–100.0)
Monocytes Absolute: 0.6 10*3/uL (ref 0.1–1.0)
Monocytes Relative: 10 %
Neutro Abs: 2.7 10*3/uL (ref 1.7–7.7)
Neutrophils Relative %: 46 %
Platelets: 338 10*3/uL (ref 150–400)
RBC: 4.58 MIL/uL (ref 3.87–5.11)
RDW: 13.5 % (ref 11.5–15.5)
WBC: 5.8 10*3/uL (ref 4.0–10.5)
nRBC: 0 % (ref 0.0–0.2)

## 2018-08-28 LAB — COMPREHENSIVE METABOLIC PANEL
ALT: 16 U/L (ref 0–44)
AST: 18 U/L (ref 15–41)
Albumin: 3.8 g/dL (ref 3.5–5.0)
Alkaline Phosphatase: 93 U/L (ref 38–126)
Anion gap: 7 (ref 5–15)
BUN: 14 mg/dL (ref 8–23)
CO2: 28 mmol/L (ref 22–32)
Calcium: 9.6 mg/dL (ref 8.9–10.3)
Chloride: 100 mmol/L (ref 98–111)
Creatinine, Ser: 0.63 mg/dL (ref 0.44–1.00)
GFR calc Af Amer: >60 mL/min (ref >60)
GFR calc non Af Amer: >60 mL/min (ref >60)
Glucose, Bld: 112 mg/dL — ABNORMAL HIGH (ref 70–99)
Potassium: 4.2 mmol/L (ref 3.5–5.1)
Sodium: 135 mmol/L (ref 135–145)
Total Bilirubin: 0.5 mg/dL (ref 0.3–1.2)
Total Protein: 7.8 g/dL (ref 6.5–8.1)

## 2018-08-28 LAB — TYPE AND SCREEN
ABO/RH(D): O POS
Antibody Screen: NEGATIVE

## 2018-08-28 NOTE — Patient Instructions (Addendum)
Your procedure is scheduled on: Monday, September 03, 2018  Report to [facility]     DO NOT STOP ON THE FIRST FLOOR TO REGISTER  To find out your arrival time please call [phone] between 1PM - 3PM on Friday, August 31, 2018  Remember: Instructions that are not followed completely may result in serious medical risk,  up to and including death, or upon the discretion of your surgeon and anesthesiologist your  surgery may need to be rescheduled.     _X__ 1. Do not eat food after midnight the night before your procedure.                 No gum chewing or hard candies.                   ABSOLUTELY NOTHING SOLID IN YOUR MOUTH AFTER MIDNIGHT                PERFORM THE BOWEL PREP AS PER DR. PISCOYA                 You may drink clear liquids up to 2 hours before you are scheduled to arrive for your surgery-                   DO not drink clear liquids within 2 hours of the start of your surgery.                  Clear Liquids include:  water, apple juice without pulp, clear carbohydrate                 drink such as Clearfast of Gatorade, Black Coffee or Tea (Do not add                 anything to coffee or tea).  __X__2.  On the morning of surgery brush your teeth with toothpaste and water,                   You may rinse your mouth with mouthwash if you wish.                       Do not swallow any toothpaste of mouthwash.     _X__ 3.  No Alcohol for 24 hours before or after surgery.   _X__ 4.  Do Not Smoke or use e-cigarettes For 24 Hours Prior to Your Surgery.                 Do not use any chewable tobacco products for at least 6 hours prior to                 surgery.  ____  5.  Bring all medications with you on the day of surgery if instructed.   ____  6.  Notify your doctor if there is any change in your medical condition      (cold, fever, infections).     Do not wear jewelry, make-up, hairpins, clips or nail polish. Do  not wear lotions, powders, or perfumes. You may NOT wear deodorant. Do not shave 48 hours prior to surgery. Men may shave face and neck. Do not bring valuables to the hospital.    [facility] is not responsible for any belongings or valuables.  Contacts, dentures or bridgework may not be worn into surgery. Leave your suitcase in the car. After surgery it may be  brought to your room. For patients admitted to the hospital, discharge time is determined by your treatment team.   Patients discharged the day of surgery will not be allowed to drive home.   Please read over the following fact sheets that you were given:   PREPARING FOR SURGERY   ___X Take these medicines the morning of surgery with A SIP OF WATER:    1.BREO, PLEASE BRING WITH YOU             2. KLONOPIN     3. CYMBALTA  4. ZETIA  5. GABAPENTIN  6. SYNTHROID             7. FLONASE, IF NEEDED             8. BRING NICODERM PATCH WITH YOU  ____ Fleet Enema (as directed)   _X___ Use CHG Soap as directed  _X___ Use inhalers on the day of surgery  __X__ Stop metformin 2 days prior to surgery                LAST DOSE ON 08/29/2018   __X__ Stop ALL ASPIRIN PRODUCTS               THIS INCLUDES BC POWDERS / GOODIES POWDERS   _X__ Stop Anti-inflammatories AS OF TODAY              THIS INCLUDES MOBIC AND VOLTAREN GEL                    TYLENOL IS OKAY AT ANY TIME   _X___ Stop supplements until after surgery.              THIS INCLUDES:  VITAMIN B6  YOU MAY CONTINUE TAKING VITAMIN B12 AND USING NYSTATIN POWDER     BUT DO NOT USE EITHER ON THE DAY OF SURGERY. ALSO, YOU MAY USE TESSALON, BUT NOT ON THE DAY OF SURGERY  WEAR LOOSE AND COMFORTABLE CLOTHING TO [place].    BRING SAFE AND STURDY SLIPPERS/ SHOES  KEEP CLEANING YOUR DRAINAGE TUBE AS USUAL BUT DO NOT ADD    NYSTATIN POWDER ON THE DAY OF SURGERY.  IF YOU FIND YOUR MEDICAL DIRECTIVES, BRING A COPY WITH YOU.    WE WILL MAKE A COPY FOR YOUR CHART  ____  Bring C-Pap to the hospital.

## 2018-08-28 NOTE — Consult Note (Signed)
[place] Nurse requested for preoperative stoma site marking  Discussed surgical procedure and stoma creation with patient and family.  Explained role of the [place] nurse team.  Provided the patient with educational booklet and provided samples of pouching options.  Answered patient and family questions.   Examined patient sitting, and standing in order to place the marking in the patient's visual field, away from any creases or abdominal contour issues and within the rectus muscle.  Attempted to mark below the patient's belt line.   Marked for colostomy in the LLQ  __8__ cm to the left of the umbilicus and __0.1__ID below the umbilicus.  Patient's abdomen cleansed with CHG wipes at site markings, allowed to air dry prior to marking.Covered mark with thin film transparent dressing to preserve mark until date of surgery.   [place] Nurse team will follow up with patient after surgery for continue ostomy care and teaching.  Val Riles, RN, MSN, CWOCN, CNS-BC, pager [phone]

## 2018-08-29 ENCOUNTER — Telehealth: Payer: Self-pay | Admitting: *Deleted

## 2018-08-29 NOTE — Telephone Encounter (Signed)
I talked with the patient and she states the bloody drainage does still have some yellow to clear in it as well. She states it is only about 5-10 ml each time she empty's it and she is emptying it 2-3 times a day. Surgery is for Monday, Dr Hampton Abbot aware and no action at this time. The patient is aware to call back for any questions or concerns or changes in drainage amount.

## 2018-08-29 NOTE — Telephone Encounter (Signed)
Patient called and stated that she still has drain and there is nothing but blood coming out of it. Is this normal?

## 2018-09-02 MED ORDER — SODIUM CHLORIDE 0.9 % IV SOLN
2.0000 g | INTRAVENOUS | Status: AC
  Administered 2018-09-03: 2 g via INTRAVENOUS
  Filled 2018-09-02: qty 2

## 2018-09-03 ENCOUNTER — Inpatient Hospital Stay: Payer: BLUE CROSS/BLUE SHIELD | Admitting: Certified Registered Nurse Anesthetist

## 2018-09-03 ENCOUNTER — Encounter: Payer: Self-pay | Admitting: *Deleted

## 2018-09-03 ENCOUNTER — Inpatient Hospital Stay
Admission: RE | Admit: 2018-09-03 | Discharge: 2018-09-06 | DRG: 331 | Disposition: A | Discharge status: Home / Self Care | Payer: BLUE CROSS/BLUE SHIELD | Attending: Surgery | Admitting: Surgery

## 2018-09-03 ENCOUNTER — Inpatient Hospital Stay: Payer: BLUE CROSS/BLUE SHIELD

## 2018-09-03 ENCOUNTER — Encounter
Admission: RE | Disposition: A | Discharge status: Home / Self Care | Payer: Self-pay | Source: Home / Self Care | Attending: Surgery

## 2018-09-03 ENCOUNTER — Other Ambulatory Visit: Payer: Self-pay

## 2018-09-03 DIAGNOSIS — M797 Fibromyalgia: Secondary | ICD-10-CM | POA: Diagnosis not present

## 2018-09-03 DIAGNOSIS — E119 Type 2 diabetes mellitus without complications: Secondary | ICD-10-CM | POA: Diagnosis not present

## 2018-09-03 DIAGNOSIS — F419 Anxiety disorder, unspecified: Secondary | ICD-10-CM | POA: Diagnosis present

## 2018-09-03 DIAGNOSIS — Z79899 Other long term (current) drug therapy: Secondary | ICD-10-CM

## 2018-09-03 DIAGNOSIS — R21 Rash and other nonspecific skin eruption: Secondary | ICD-10-CM | POA: Diagnosis not present

## 2018-09-03 DIAGNOSIS — Z79890 Hormone replacement therapy (postmenopausal): Secondary | ICD-10-CM

## 2018-09-03 DIAGNOSIS — E039 Hypothyroidism, unspecified: Secondary | ICD-10-CM | POA: Diagnosis not present

## 2018-09-03 DIAGNOSIS — J449 Chronic obstructive pulmonary disease, unspecified: Secondary | ICD-10-CM | POA: Diagnosis not present

## 2018-09-03 DIAGNOSIS — K5780 Diverticulitis of intestine, part unspecified, with perforation and abscess without bleeding: Secondary | ICD-10-CM | POA: Diagnosis not present

## 2018-09-03 DIAGNOSIS — K219 Gastro-esophageal reflux disease without esophagitis: Secondary | ICD-10-CM | POA: Diagnosis not present

## 2018-09-03 DIAGNOSIS — Z9049 Acquired absence of other specified parts of digestive tract: Secondary | ICD-10-CM | POA: Diagnosis not present

## 2018-09-03 DIAGNOSIS — F17210 Nicotine dependence, cigarettes, uncomplicated: Secondary | ICD-10-CM | POA: Diagnosis present

## 2018-09-03 DIAGNOSIS — Z888 Allergy status to other drugs, medicaments and biological substances status: Secondary | ICD-10-CM

## 2018-09-03 DIAGNOSIS — E669 Obesity, unspecified: Secondary | ICD-10-CM | POA: Diagnosis present

## 2018-09-03 DIAGNOSIS — Z79891 Long term (current) use of opiate analgesic: Secondary | ICD-10-CM | POA: Diagnosis not present

## 2018-09-03 DIAGNOSIS — Z9851 Tubal ligation status: Secondary | ICD-10-CM

## 2018-09-03 DIAGNOSIS — Z7984 Long term (current) use of oral hypoglycemic drugs: Secondary | ICD-10-CM

## 2018-09-03 DIAGNOSIS — Z791 Long term (current) use of non-steroidal anti-inflammatories (NSAID): Secondary | ICD-10-CM | POA: Diagnosis not present

## 2018-09-03 DIAGNOSIS — K5720 Diverticulitis of large intestine with perforation and abscess without bleeding: Secondary | ICD-10-CM | POA: Diagnosis not present

## 2018-09-03 DIAGNOSIS — I10 Essential (primary) hypertension: Secondary | ICD-10-CM | POA: Diagnosis not present

## 2018-09-03 DIAGNOSIS — F329 Major depressive disorder, single episode, unspecified: Secondary | ICD-10-CM | POA: Diagnosis not present

## 2018-09-03 DIAGNOSIS — Z408 Encounter for other prophylactic surgery: Secondary | ICD-10-CM | POA: Diagnosis not present

## 2018-09-03 DIAGNOSIS — Z6830 Body mass index (BMI) 30.0-30.9, adult: Secondary | ICD-10-CM

## 2018-09-03 HISTORY — PX: LAPAROSCOPIC SIGMOID COLECTOMY: SHX5928

## 2018-09-03 HISTORY — PX: CYSTOSCOPY WITH STENT PLACEMENT: SHX5790

## 2018-09-03 HISTORY — PX: COLECTOMY WITH COLOSTOMY CREATION/HARTMANN PROCEDURE: SHX6598

## 2018-09-03 LAB — GLUCOSE, CAPILLARY
Glucose-Capillary: 153 mg/dL — ABNORMAL HIGH (ref 70–99)
Glucose-Capillary: 179 mg/dL — ABNORMAL HIGH (ref 70–99)
Glucose-Capillary: 164 mg/dL — ABNORMAL HIGH (ref 70–99)
Glucose-Capillary: 168 mg/dL — ABNORMAL HIGH (ref 70–99)

## 2018-09-03 LAB — SURGICAL PCR SCREEN
MRSA, PCR: NEGATIVE
Staphylococcus aureus: NEGATIVE

## 2018-09-03 LAB — ABO/RH: ABO/RH(D): O POS

## 2018-09-03 SURGERY — COLECTOMY, SIGMOID, LAPAROSCOPIC
Anesthesia: General | Wound class: "Clean Contaminated "

## 2018-09-03 MED ORDER — PHENYLEPHRINE HCL 10 MG/ML IJ SOLN
INTRAMUSCULAR | Status: DC | PRN
  Administered 2018-09-03 (×4): 100 ug via INTRAVENOUS

## 2018-09-03 MED ORDER — BUPIVACAINE-EPINEPHRINE (PF) 0.25% -1:200000 IJ SOLN
INTRAMUSCULAR | Status: DC | PRN
  Administered 2018-09-03: 30 mL via PERINEURAL

## 2018-09-03 MED ORDER — KETOROLAC TROMETHAMINE 30 MG/ML IJ SOLN
30.0000 mg | Freq: Four times a day (QID) | INTRAMUSCULAR | Status: DC
  Administered 2018-09-03 – 2018-09-06 (×12): 30 mg via INTRAVENOUS
  Filled 2018-09-03 (×12): qty 1

## 2018-09-03 MED ORDER — SODIUM CHLORIDE 0.9 % IV SOLN
INTRAVENOUS | Status: DC
  Administered 2018-09-03 (×2): via INTRAVENOUS

## 2018-09-03 MED ORDER — ALBUTEROL SULFATE HFA 108 (90 BASE) MCG/ACT IN AERS: 1.0000 | INHALATION_SPRAY | RESPIRATORY_TRACT | Status: DC | PRN

## 2018-09-03 MED ORDER — OXYCODONE HCL 5 MG PO TABS
5.0000 mg | ORAL_TABLET | ORAL | Status: DC | PRN
  Administered 2018-09-03 – 2018-09-06 (×9): 10 mg via ORAL
  Filled 2018-09-03 (×9): qty 2

## 2018-09-03 MED ORDER — ALVIMOPAN 12 MG PO CAPS
12.0000 mg | ORAL_CAPSULE | ORAL | Status: AC
  Administered 2018-09-03: 12 mg via ORAL

## 2018-09-03 MED ORDER — NICOTINE 21 MG/24HR TD PT24
21.0000 mg | MEDICATED_PATCH | Freq: Every day | TRANSDERMAL | Status: DC
  Administered 2018-09-04 – 2018-09-06 (×3): 21 mg via TRANSDERMAL
  Filled 2018-09-03 (×3): qty 1

## 2018-09-03 MED ORDER — FLUTICASONE FUROATE-VILANTEROL 200-25 MCG/INH IN AEPB
1.0000 | INHALATION_SPRAY | Freq: Every day | RESPIRATORY_TRACT | Status: DC
  Administered 2018-09-04 – 2018-09-06 (×3): 1 via RESPIRATORY_TRACT
  Filled 2018-09-03: qty 28

## 2018-09-03 MED ORDER — IPRATROPIUM-ALBUTEROL 0.5-2.5 (3) MG/3ML IN SOLN: 3.0000 mL | Freq: Four times a day (QID) | RESPIRATORY_TRACT | Status: DC | PRN

## 2018-09-03 MED ORDER — OXYCODONE HCL 5 MG/5ML PO SOLN: 5.0000 mg | Freq: Once | ORAL | Status: DC | PRN

## 2018-09-03 MED ORDER — SODIUM CHLORIDE (PF) 0.9 % IJ SOLN
INTRAMUSCULAR | Status: AC
  Filled 2018-09-03: qty 50

## 2018-09-03 MED ORDER — ROCURONIUM BROMIDE 100 MG/10ML IV SOLN
INTRAVENOUS | Status: DC | PRN
  Administered 2018-09-03: 10 mg via INTRAVENOUS
  Administered 2018-09-03: 20 mg via INTRAVENOUS
  Administered 2018-09-03: 50 mg via INTRAVENOUS
  Administered 2018-09-03 (×2): 10 mg via INTRAVENOUS
  Administered 2018-09-03: 30 mg via INTRAVENOUS

## 2018-09-03 MED ORDER — LOSARTAN POTASSIUM 50 MG PO TABS
50.0000 mg | ORAL_TABLET | Freq: Every day | ORAL | Status: DC
  Administered 2018-09-04 – 2018-09-06 (×3): 50 mg via ORAL
  Filled 2018-09-03 (×3): qty 1

## 2018-09-03 MED ORDER — FAMOTIDINE 20 MG PO TABS
20.0000 mg | ORAL_TABLET | Freq: Once | ORAL | Status: AC
  Administered 2018-09-03: 20 mg via ORAL

## 2018-09-03 MED ORDER — SUGAMMADEX SODIUM 200 MG/2ML IV SOLN
INTRAVENOUS | Status: DC | PRN
  Administered 2018-09-03: 200 mg via INTRAVENOUS

## 2018-09-03 MED ORDER — ACETAMINOPHEN 500 MG PO TABS
1000.0000 mg | ORAL_TABLET | Freq: Four times a day (QID) | ORAL | Status: DC
  Administered 2018-09-03 – 2018-09-06 (×12): 1000 mg via ORAL
  Filled 2018-09-03 (×12): qty 2

## 2018-09-03 MED ORDER — PROPOFOL 10 MG/ML IV BOLUS
INTRAVENOUS | Status: DC | PRN
  Administered 2018-09-03: 130 mg via INTRAVENOUS
  Administered 2018-09-03: 10 mg via INTRAVENOUS

## 2018-09-03 MED ORDER — MEPERIDINE HCL 50 MG/ML IJ SOLN: 6.2500 mg | INTRAMUSCULAR | Status: DC | PRN

## 2018-09-03 MED ORDER — GABAPENTIN 400 MG PO CAPS
1200.0000 mg | ORAL_CAPSULE | Freq: Every day | ORAL | Status: DC
  Administered 2018-09-03 – 2018-09-05 (×3): 1200 mg via ORAL
  Filled 2018-09-03 (×3): qty 3

## 2018-09-03 MED ORDER — DEXAMETHASONE SODIUM PHOSPHATE 10 MG/ML IJ SOLN
INTRAMUSCULAR | Status: DC | PRN
  Administered 2018-09-03: 5 mg via INTRAVENOUS

## 2018-09-03 MED ORDER — INSULIN ASPART 100 UNIT/ML ~~LOC~~ SOLN: 0.0000 [IU] | Freq: Every day | SUBCUTANEOUS | Status: DC

## 2018-09-03 MED ORDER — ALVIMOPAN 12 MG PO CAPS
ORAL_CAPSULE | ORAL | Status: AC
  Filled 2018-09-03: qty 1

## 2018-09-03 MED ORDER — ALBUTEROL SULFATE (2.5 MG/3ML) 0.083% IN NEBU: 2.5000 mg | INHALATION_SOLUTION | RESPIRATORY_TRACT | Status: DC | PRN

## 2018-09-03 MED ORDER — MIDAZOLAM HCL 2 MG/2ML IJ SOLN
INTRAMUSCULAR | Status: AC
  Filled 2018-09-03: qty 2

## 2018-09-03 MED ORDER — SODIUM CHLORIDE 0.9 % IV SOLN
INTRAVENOUS | Status: DC | PRN
  Administered 2018-09-03: 25 ug/min via INTRAVENOUS

## 2018-09-03 MED ORDER — ACETAMINOPHEN 500 MG PO TABS
1000.0000 mg | ORAL_TABLET | ORAL | Status: AC
  Administered 2018-09-03: 1000 mg via ORAL

## 2018-09-03 MED ORDER — PROPOFOL 10 MG/ML IV BOLUS
INTRAVENOUS | Status: AC
  Filled 2018-09-03: qty 20

## 2018-09-03 MED ORDER — IOTHALAMATE MEGLUMINE 43 % IV SOLN
INTRAVENOUS | Status: DC | PRN
  Administered 2018-09-03: 5 mL

## 2018-09-03 MED ORDER — PROMETHAZINE HCL 25 MG/ML IJ SOLN: 6.2500 mg | INTRAMUSCULAR | Status: DC | PRN

## 2018-09-03 MED ORDER — BUPIVACAINE LIPOSOME 1.3 % IJ SUSP
INTRAMUSCULAR | Status: DC | PRN
  Administered 2018-09-03: 20 mL

## 2018-09-03 MED ORDER — FLUTICASONE PROPIONATE 50 MCG/ACT NA SUSP
2.0000 | Freq: Every day | NASAL | Status: DC
  Administered 2018-09-04 – 2018-09-06 (×3): 2 via NASAL
  Filled 2018-09-03: qty 16

## 2018-09-03 MED ORDER — ACETAMINOPHEN 500 MG PO TABS
ORAL_TABLET | ORAL | Status: AC
  Filled 2018-09-03: qty 2

## 2018-09-03 MED ORDER — LACTATED RINGERS IV SOLN
INTRAVENOUS | Status: DC
  Administered 2018-09-03 – 2018-09-05 (×4): via INTRAVENOUS

## 2018-09-03 MED ORDER — FENTANYL CITRATE (PF) 100 MCG/2ML IJ SOLN
INTRAMUSCULAR | Status: DC | PRN
  Administered 2018-09-03 (×4): 50 ug via INTRAVENOUS

## 2018-09-03 MED ORDER — GABAPENTIN 300 MG PO CAPS: 300.0000 mg | ORAL_CAPSULE | ORAL | Status: DC

## 2018-09-03 MED ORDER — ONDANSETRON HCL 4 MG/2ML IJ SOLN: 4.0000 mg | Freq: Four times a day (QID) | INTRAMUSCULAR | Status: DC | PRN

## 2018-09-03 MED ORDER — FENTANYL CITRATE (PF) 100 MCG/2ML IJ SOLN
INTRAMUSCULAR | Status: AC
  Filled 2018-09-03: qty 2

## 2018-09-03 MED ORDER — MINERAL OIL LIGHT 100 % EX OIL
TOPICAL_OIL | CUTANEOUS | Status: AC
  Filled 2018-09-03: qty 25

## 2018-09-03 MED ORDER — FENTANYL CITRATE (PF) 100 MCG/2ML IJ SOLN
25.0000 ug | INTRAMUSCULAR | Status: DC | PRN
  Administered 2018-09-03: 25 ug via INTRAVENOUS

## 2018-09-03 MED ORDER — HYDROMORPHONE HCL 1 MG/ML IJ SOLN
0.5000 mg | INTRAMUSCULAR | Status: DC | PRN
  Administered 2018-09-03: 0.5 mg via INTRAVENOUS
  Filled 2018-09-03 (×2): qty 0.5

## 2018-09-03 MED ORDER — BUPIVACAINE-EPINEPHRINE (PF) 0.5% -1:200000 IJ SOLN
INTRAMUSCULAR | Status: AC
  Filled 2018-09-03: qty 90

## 2018-09-03 MED ORDER — LIDOCAINE HCL (CARDIAC) PF 100 MG/5ML IV SOSY
PREFILLED_SYRINGE | INTRAVENOUS | Status: DC | PRN
  Administered 2018-09-03: 100 mg via INTRAVENOUS

## 2018-09-03 MED ORDER — INSULIN ASPART 100 UNIT/ML ~~LOC~~ SOLN
0.0000 [IU] | Freq: Three times a day (TID) | SUBCUTANEOUS | Status: DC
  Administered 2018-09-04 (×2): 2 [IU] via SUBCUTANEOUS
  Administered 2018-09-06: 5 [IU] via SUBCUTANEOUS
  Filled 2018-09-03 (×3): qty 1

## 2018-09-03 MED ORDER — GABAPENTIN 300 MG PO CAPS
900.0000 mg | ORAL_CAPSULE | Freq: Every day | ORAL | Status: DC
  Administered 2018-09-04 – 2018-09-06 (×3): 900 mg via ORAL
  Filled 2018-09-03 (×3): qty 3

## 2018-09-03 MED ORDER — FENTANYL CITRATE (PF) 100 MCG/2ML IJ SOLN
INTRAMUSCULAR | Status: AC
  Administered 2018-09-03: 25 ug via INTRAVENOUS
  Filled 2018-09-03: qty 2

## 2018-09-03 MED ORDER — FAMOTIDINE 20 MG PO TABS
ORAL_TABLET | ORAL | Status: AC
  Filled 2018-09-03: qty 1

## 2018-09-03 MED ORDER — CHLORHEXIDINE GLUCONATE CLOTH 2 % EX PADS: 6.0000 | MEDICATED_PAD | Freq: Once | CUTANEOUS | Status: DC

## 2018-09-03 MED ORDER — ALVIMOPAN 12 MG PO CAPS
12.0000 mg | ORAL_CAPSULE | Freq: Two times a day (BID) | ORAL | Status: DC
  Administered 2018-09-04 – 2018-09-05 (×3): 12 mg via ORAL
  Filled 2018-09-03 (×4): qty 1

## 2018-09-03 MED ORDER — SODIUM CHLORIDE 0.9 % IV SOLN
2.0000 g | Freq: Three times a day (TID) | INTRAVENOUS | Status: AC
  Administered 2018-09-03 – 2018-09-04 (×3): 2 g via INTRAVENOUS
  Filled 2018-09-03 (×3): qty 2

## 2018-09-03 MED ORDER — ROCURONIUM BROMIDE 50 MG/5ML IV SOLN
INTRAVENOUS | Status: AC
  Filled 2018-09-03: qty 1

## 2018-09-03 MED ORDER — LEVOTHYROXINE SODIUM 50 MCG PO TABS
75.0000 ug | ORAL_TABLET | Freq: Every day | ORAL | Status: DC
  Administered 2018-09-04 – 2018-09-06 (×3): 75 ug via ORAL
  Filled 2018-09-03 (×3): qty 2

## 2018-09-03 MED ORDER — LACTATED RINGERS IV SOLN
INTRAVENOUS | Status: DC | PRN
  Administered 2018-09-03: 10:00:00 via INTRAVENOUS

## 2018-09-03 MED ORDER — THEOPHYLLINE ER 300 MG PO CP24
300.0000 mg | ORAL_CAPSULE | Freq: Every day | ORAL | Status: DC
  Administered 2018-09-03 – 2018-09-06 (×4): 300 mg via ORAL
  Filled 2018-09-03: qty 1
  Filled 2018-09-03 (×5): qty 3

## 2018-09-03 MED ORDER — ONDANSETRON 4 MG PO TBDP: 4.0000 mg | ORAL_TABLET | Freq: Four times a day (QID) | ORAL | Status: DC | PRN

## 2018-09-03 MED ORDER — THEOPHYLLINE ER 200 MG PO CP24
300.0000 mg | ORAL_CAPSULE | Freq: Every day | ORAL | Status: DC
  Filled 2018-09-03: qty 1

## 2018-09-03 MED ORDER — BUPIVACAINE LIPOSOME 1.3 % IJ SUSP
INTRAMUSCULAR | Status: AC
  Filled 2018-09-03: qty 20

## 2018-09-03 MED ORDER — BUPIVACAINE LIPOSOME 1.3 % IJ SUSP: 20.0000 mL | Freq: Once | INTRAMUSCULAR | Status: DC

## 2018-09-03 MED ORDER — ENOXAPARIN SODIUM 40 MG/0.4ML ~~LOC~~ SOLN
40.0000 mg | SUBCUTANEOUS | Status: DC
  Administered 2018-09-04 – 2018-09-06 (×3): 40 mg via SUBCUTANEOUS
  Filled 2018-09-03 (×3): qty 0.4

## 2018-09-03 MED ORDER — ONDANSETRON HCL 4 MG/2ML IJ SOLN
INTRAMUSCULAR | Status: DC | PRN
  Administered 2018-09-03: 4 mg via INTRAVENOUS

## 2018-09-03 MED ORDER — OXYCODONE HCL 5 MG PO TABS: 5.0000 mg | ORAL_TABLET | Freq: Once | ORAL | Status: DC | PRN

## 2018-09-03 MED ORDER — SUGAMMADEX SODIUM 200 MG/2ML IV SOLN
INTRAVENOUS | Status: AC
  Filled 2018-09-03: qty 2

## 2018-09-03 MED ORDER — BUPIVACAINE-EPINEPHRINE (PF) 0.25% -1:200000 IJ SOLN
INTRAMUSCULAR | Status: AC
  Filled 2018-09-03: qty 30

## 2018-09-03 MED ORDER — DULOXETINE HCL 30 MG PO CPEP
60.0000 mg | ORAL_CAPSULE | Freq: Two times a day (BID) | ORAL | Status: DC
  Administered 2018-09-03 – 2018-09-06 (×6): 60 mg via ORAL
  Filled 2018-09-03 (×6): qty 2

## 2018-09-03 MED ORDER — PANTOPRAZOLE SODIUM 40 MG IV SOLR
40.0000 mg | Freq: Every day | INTRAVENOUS | Status: DC
  Administered 2018-09-03 – 2018-09-05 (×3): 40 mg via INTRAVENOUS
  Filled 2018-09-03 (×3): qty 40

## 2018-09-03 SURGICAL SUPPLY — 82 items
"PENCIL ELECTRO HAND CTR " (MISCELLANEOUS) ×2 IMPLANT
ADAPTER GOLDBERG URETERAL (ADAPTER) ×2 IMPLANT
BAG DRAIN CYSTO-URO LG1000N (MISCELLANEOUS) ×4 IMPLANT
BLADE SURG SZ10 CARB STEEL (BLADE) ×4 IMPLANT
BULB RESERV EVAC DRAIN JP 100C (MISCELLANEOUS) ×4 IMPLANT
CANISTER SUCT 1200ML W/VALVE (MISCELLANEOUS) ×4 IMPLANT
CATH URETL 5X70 OPEN END (CATHETERS) ×4 IMPLANT
CHLORAPREP W/TINT 26ML (MISCELLANEOUS) ×4 IMPLANT
CONRAY 43 FOR UROLOGY 50M (MISCELLANEOUS) ×4 IMPLANT
COVER WAND RF STERILE (DRAPES) ×8 IMPLANT
DRAIN CHANNEL JP 19F (MISCELLANEOUS) ×4 IMPLANT
DRAPE LEGGINS SURG 28X43 STRL (DRAPES) ×4 IMPLANT
DRAPE UNDER BUTTOCK W/FLU (DRAPES) ×4 IMPLANT
DRSG OPSITE POSTOP 3X4 (GAUZE/BANDAGES/DRESSINGS) ×6 IMPLANT
DRSG OPSITE POSTOP 4X8 (GAUZE/BANDAGES/DRESSINGS) ×2 IMPLANT
ELECT REM PT RETURN 9FT ADLT (ELECTROSURGICAL) ×4
ELECTRODE REM PT RTRN 9FT ADLT (ELECTROSURGICAL) ×2 IMPLANT
GLOVE BIO SURGEON STRL SZ 6.5 (GLOVE) ×5 IMPLANT
GLOVE BIO SURGEONS STRL SZ 6.5 (GLOVE) ×3
GLOVE SURG SYN 7.0 (GLOVE) ×12 IMPLANT
GLOVE SURG SYN 7.0 PF PI (GLOVE) ×6 IMPLANT
GLOVE SURG SYN 7.5  E (GLOVE) ×6
GLOVE SURG SYN 7.5 E (GLOVE) ×6 IMPLANT
GLOVE SURG SYN 7.5 PF PI (GLOVE) ×6 IMPLANT
GOWN STRL REUS W/ TWL LRG LVL3 (GOWN DISPOSABLE) ×4 IMPLANT
GOWN STRL REUS W/TWL LRG LVL3 (GOWN DISPOSABLE) ×4
GUIDEWIRE STR DUAL SENSOR (WIRE) ×4 IMPLANT
HANDLE YANKAUER SUCT BULB TIP (MISCELLANEOUS) ×4 IMPLANT
HOLDER FOLEY CATH W/STRAP (MISCELLANEOUS) ×4 IMPLANT
IRRIGATION STRYKERFLOW (MISCELLANEOUS) ×2 IMPLANT
IRRIGATOR STRYKERFLOW (MISCELLANEOUS) ×4
IV NS 1000ML (IV SOLUTION) ×2
IV NS 1000ML BAXH (IV SOLUTION) ×2 IMPLANT
KIT TURNOVER CYSTO (KITS) ×4 IMPLANT
KIT TURNOVER KIT A (KITS) ×4 IMPLANT
KIT URETTERAL LIGHTED STENTS (STENTS) ×2 IMPLANT
LABEL OR SOLS (LABEL) ×4 IMPLANT
LIGASURE LAP MARYLAND 5MM 37CM (ELECTROSURGICAL) ×4 IMPLANT
LIGASURE MARYLAND LAP STAND (ELECTROSURGICAL) ×2 IMPLANT
NEEDLE HYPO 22GX1.5 SAFETY (NEEDLE) ×4 IMPLANT
NS IRRIG 1000ML POUR BTL (IV SOLUTION) ×4 IMPLANT
PACK COLON CLEAN CLOSURE (MISCELLANEOUS) ×4 IMPLANT
PACK CYSTO AR (MISCELLANEOUS) ×4 IMPLANT
PACK LAP CHOLECYSTECTOMY (MISCELLANEOUS) ×4 IMPLANT
PENCIL ELECTRO HAND CTR (MISCELLANEOUS) ×4 IMPLANT
RELOAD STAPLE 60 3.6 BLU REG (STAPLE) IMPLANT
RELOAD STAPLER BLUE 60MM (STAPLE) ×2 IMPLANT
RETRACTOR WND ALEXIS-O 25 LRG (MISCELLANEOUS) ×2 IMPLANT
RETRACTOR WOUND ALXS 18CM SML (MISCELLANEOUS) IMPLANT
RTRCTR WOUND ALEXIS O 18CM SML (MISCELLANEOUS) ×4
RTRCTR WOUND ALEXIS O 25CM LRG (MISCELLANEOUS) ×4
SET CYSTO W/LG BORE CLAMP LF (SET/KITS/TRAYS/PACK) ×4 IMPLANT
SIDE-ARM FITTING (MISCELLANEOUS) ×4 IMPLANT
SLEEVE ADV FIXATION 5X100MM (TROCAR) ×4 IMPLANT
SOL .9 NS 3000ML IRR  AL (IV SOLUTION) ×2
SOL .9 NS 3000ML IRR UROMATIC (IV SOLUTION) ×2 IMPLANT
SPONGE LAP 18X18 RF (DISPOSABLE) ×4 IMPLANT
STAPLE ECHEON FLEX 60 POW ENDO (STAPLE) ×2 IMPLANT
STAPLER PROX 25M (MISCELLANEOUS) ×2 IMPLANT
STAPLER RELOAD BLUE 60MM (STAPLE) ×4
STAPLER SKIN PROX 35W (STAPLE) ×4 IMPLANT
SURGILUBE 2OZ TUBE FLIPTOP (MISCELLANEOUS) ×4 IMPLANT
SUT ETHILON 3-0 (SUTURE) ×4 IMPLANT
SUT PDS AB 1 CT1 27 (SUTURE) ×8 IMPLANT
SUT PROLENE 2-0 (SUTURE) ×4
SUT PROLENE 2-0 TS 14X2 ARM (SUTURE) ×4
SUT SILK 3-0 (SUTURE) ×4 IMPLANT
SUT VIC AB 3-0 SH 27 (SUTURE) ×2
SUT VIC AB 3-0 SH 27X BRD (SUTURE) ×2 IMPLANT
SUT VICRYL 0 AB UR-6 (SUTURE) ×4 IMPLANT
SUTURE PROLEN 2-0 TS 14X2 ARM (SUTURE) IMPLANT
SYR 30ML LL (SYRINGE) ×8 IMPLANT
SYRINGE IRR TOOMEY STRL 70CC (SYRINGE) ×4 IMPLANT
TOWEL OR 17X26 4PK STRL BLUE (TOWEL DISPOSABLE) ×4 IMPLANT
TRAY FOLEY MTR SLVR 16FR STAT (SET/KITS/TRAYS/PACK) ×4 IMPLANT
TROCAR 130MM GELPORT  DAV (MISCELLANEOUS) ×3 IMPLANT
TROCAR ADV FIXATION 12X100MM (TROCAR) ×2 IMPLANT
TROCAR ADV FIXATION 5X100MM (TROCAR) ×4 IMPLANT
TROCAR BALLN GELPORT 12X130M (ENDOMECHANICALS) ×2 IMPLANT
TROCAR XCEL 12X100 BLDLESS (ENDOMECHANICALS) ×4 IMPLANT
TUBING EVAC SMOKE HEATED PNEUM (TUBING) ×4 IMPLANT
WATER STERILE IRR 1000ML POUR (IV SOLUTION) ×4 IMPLANT

## 2018-09-03 NOTE — Anesthesia Postprocedure Evaluation (Signed)
Anesthesia Post Note  Patient: Linda Bradford  Procedure(s) Performed: LAPAROSCOPIC SIGMOID COLECTOMY (N/A ) POSSIBLE COLOSTOMY (N/A ) CYSTOSCOPY WITH STENT PLACEMENT-LIGHTED STENTS (Bilateral )  Patient location during evaluation: PACU Anesthesia Type: General Level of consciousness: awake and alert Pain management: pain level controlled Vital Signs Assessment: post-procedure vital signs reviewed and stable Respiratory status: spontaneous breathing, nonlabored ventilation, respiratory function stable and patient connected to nasal cannula oxygen Cardiovascular status: blood pressure returned to baseline and stable Postop Assessment: no apparent nausea or vomiting Anesthetic complications: no     Last Vitals:  Vitals:   09/03/18 1728 09/03/18 1801  BP: (!) 152/67 (!) 159/77  Pulse: 85 84  Resp: 11 16  Temp: 36.6 C (!) 36.4 C  SpO2: 95% 99%    Last Pain:  Vitals:   09/03/18 1801  TempSrc: Oral  PainSc:                  Precious Haws Piscitello

## 2018-09-03 NOTE — Anesthesia Post-op Follow-up Note (Signed)
Anesthesia QCDR form completed.        

## 2018-09-03 NOTE — Interval H&P Note (Signed)
History and Physical Interval Note:  09/03/2018 8:18 AM  Linda Bradford  has presented today for surgery, with the diagnosis of Diverticulitis with abscess  The various methods of treatment have been discussed with the patient and family. After consideration of risks, benefits and other options for treatment, the patient has consented to  Procedure(s) with comments: LAPAROSCOPIC SIGMOID COLECTOMY (N/A) - Will need bilateral lighted ureteral stents to be placed by Urology at the beginning of case.  Will also need Dr. Genevive Bi to assist. POSSIBLE COLOSTOMY (N/A) CYSTOSCOPY WITH STENT PLACEMENT-LIGHTED STENTS (Bilateral) as a surgical intervention .  The patient's history has been reviewed, patient examined, no change in status, stable for surgery.  I have reviewed the patient's chart and labs.  Questions were answered to the patient's satisfaction.     Mattia Osterman

## 2018-09-03 NOTE — Interval H&P Note (Signed)
History and Physical Interval Note:  09/03/2018 8:40 AM  Linda Bradford  has presented today for surgery, with the diagnosis of Diverticulitis with abscess  The various methods of treatment have been discussed with the patient and family. After consideration of risks, benefits and other options for treatment, the patient has consented to  Procedure(s) with comments: LAPAROSCOPIC SIGMOID COLECTOMY (N/A) - Will need bilateral lighted ureteral stents to be placed by Urology at the beginning of case.  Will also need Dr. Genevive Bi to assist. POSSIBLE COLOSTOMY (N/A) CYSTOSCOPY WITH STENT PLACEMENT-LIGHTED STENTS (Bilateral) as a surgical intervention .  The patient's history has been reviewed, patient examined, no change in status, stable for surgery.  I have reviewed the patient's chart and labs.  Questions were answered to the patient's satisfaction.    I have been asked by Dr. Hampton Abbot to place preoperative lighted ureteral stents.  I met the patient in the preoperative holding area this morning explained my portion of the procedure which includes retrograde pyelogram and placement of bilateral ureteral stent.  We discussed the risk of the procedure including risk of bleeding, infection, damage to surrounding structures, ureteral edema amongst others.  We discussed that the intent of stents is to aid the surgeon and ureteral identification and potential injuries.  She is agreeable with this plan.  All questions answered.  RRR CTAB  Hollice Espy

## 2018-09-03 NOTE — Op Note (Signed)
Procedure Date:  09/03/2018  Pre-operative Diagnosis:  Sigmoid Diverticulitis with Abscess  Post-operative Diagnosis:  Sigmoid Diverticulitis with Abscess  Procedure:  Laparoscopic Sigmoidectomy, Laparoscopic takedown of splenix flexure, colorectal anastomosis.  Surgeon:  Melvyn Neth, MD  Assistant:  Nestor Lewandowsky, MD.  His assistance was critical due to the complexity of the case, the need for two surgeons to help with mobilization and trans-rectal anastomosis.  Anesthesia:  General endotracheal  Estimated Blood Loss:  100 ml  Specimens:  Sigmoid colon  Complications:  None  Indications for Procedure:  This is a 63 y.o. female with diverticulitis with abscess, not improving with percutaneous drainage resulting in fistula, and now presents for sigmoidectomy. The risks of bleeding, infection, bowel injury, need for diverting ostomy, and need for further procedures were all discussed with the patient and she was willing to proceed.   Description of Procedure: The patient was correctly identified in the preoperative area and brought into the operating room.  The patient was placed supine with VTE prophylaxis in place.  Appropriate time-outs were performed.  Anesthesia was induced and the patient was intubated.  Appropriate antibiotics were infused.  The patient was then placed in lithotomy position.   Dr. Erlene Quan then proceeded with a cystoscopy and placement of bilateral lighted ureteral stents.  Please see Dr. Cherrie Gauze op note for further details.   The patient's abdomen was prepped and draped in usual sterile fashion.  A supraumbilical incision was made and cut down technique was used to place Hasson trocar and obtain pneumoperitoneum with appropriate pressures.  A 5 mm right upper quadrant port and a 12 mm right lower quadrant port were placed under direct visualization.  The sigmoid colon was evaluated and the area of concern was identified.  The round ligament and falopian tube were  adhered to the sigmoid colon.  Careful dissection then took place to mobilize the sigmoid from descending portion to the rectum.  The left ureter was identified and visualized at all times with the use of the lighted stents.  At the rectosigmoid junction, a window was created in the mesentery and a 65 mm blue load stapler was used to divide the colon.  A 5 mm left abdominal port was placed and mobilization of the descending colon to the splenic flexure was performed, with careful attention not to injure the spleen, and mobilizing the omentum off the splenic flexure as well.  We had appropriate reach of the colon.  An infraumbilical 6 cm incision was made and pneumoperitoneum was released.  A wound protector was placed and the distal sigmoid was taken out.  A healthy portion of bowel proximal to the sigmoid colon was identified and a purse string clamp was applied with 2-0 prolene for the purse string.  The specimen was cut and removed.  The dilators were used and 25 mm was the appropriate size.  The anvil was placed and ligated with the purse string, but we noted that there was a few diverticula included at the suture line.  Thus, the purse string clamp and suture were redone after excluding an additional 3 cm segment of sigmoid colon.  Once the anvil was secured, the bowel was placed back into the abdomen and pneumoperitoneum was obtained again.  The EEA stapler was placed trans-rectally and the spike deployed along the stump staple line.  The anvil and spike were connected and clicked in place.  The stapler was closed and fired without excess tissue getting into the staple line.  The stapler  was removed and the staple line was tested using rigid sigmoidoscope air insufflation, which did not reveal any bubbles.  At that point the abdomen was irrigated and a 19 Fr. Blake drain was introduced via the left lateral port.  The right lower quadrant port was removed and the fascia was closed with 0 vicryl.    At that  point we unscrubbed and rescrubbed for clean closure.  60 ml of Exparel solution was infiltrated into the fascia and subcutaneous tissues.  The infraumbilical incision was closed with two #1 PDS sutures, the supraumbilical incision was closed with 0 Vicryl suture, and the skin incisions were closed with staples.  The drain was secured with 3-0 Nylon.  The wounds were all cleaned and dressed with Honeycomb dressing and 4x4 gauze and Tegaderm for the drain.    The right ureteral stent was removed and the left ureteral stent was kept in place along with the Foley catheter.   The patient was emerged from anesthesia and extubated and brought to the recovery room for further management.   The patient tolerated the procedure well and all counts were correct at the end of the case.    Melvyn Neth, MD

## 2018-09-03 NOTE — Op Note (Signed)
Date of procedure: 09/03/18  Preoperative diagnosis:  1. History of diverticular abscess/diverticulitis  Postoperative diagnosis:  1. Same as above  Procedure: 1. Cystoscopy 2. Bilateral retrograde pyelogram 3. Bilateral ureteral stent placement 4. Foley catheter placement  Surgeon: Hollice Espy, MD  Anesthesia: General  Complications: None  Intraoperative findings: Normal bilateral retrograde pyelograms.  Lighted ureteral stents placed without difficulty.  EBL: Minimal  Specimens: None  Drains: Lighted ureteral stents, 16 French Foley catheter  Indication: Linda Bradford is a 63 y.o. patient with diverticular abscess undergoing surgery Dr. Hampton Abbot who is requested preoperative lighted ureteral stents.  After reviewing the management options for treatment, she elected to proceed with the above surgical procedure(s). We have discussed the potential benefits and risks of the procedure, side effects of the proposed treatment, the likelihood of the patient achieving the goals of the procedure, and any potential problems that might occur during the procedure or recuperation. Informed consent has been obtained.  Description of procedure:  The patient was taken to the operating room and general anesthesia was induced.  The patient was placed in the dorsal lithotomy position, prepped and draped in the usual sterile fashion, and preoperative antibiotics were administered. A preoperative time-out was performed.   A 21 French scope was advanced per urethra into the bladder.  Attention was turned to the left ureteral orifice which was cannulated using a 5 [place] open-ended ureteral catheter.  Gentle retrograde pyelogram was performed which showed no ureteral deviation, delicate ureter without hydroureteronephrosis.  A wire was then placed up to level of the kidney without difficulty under fluoroscopic guidance.  The open-ended ureteral catheter was removed and the lighted ureteral stent sheath  was advanced to the second double line as per the instructions.  The wires removed as well as the scope and the stent was left in place.  Attention was turned to the right side the same exact procedure was performed.  This also revealed no filling defects a delicate ureter without deviation or hydronephrosis.  A wire was placed easily and ultimately the lighted ureteral stent cannula without difficulty.  Finally a 44 French Foley was placed.  The filaments for the lighted ureteral stents were advanced through the open-ended sheath up to the level of the kidney until it could be placed no further.  They were secured in place.  Using a Y connector, the Foley bag and lighted ureteral stents were attached to drainage.  Finally, each of the stents were secured using 0 silk ties.  The patient was then cleaned and dried and the remainder of the procedure was performed by general surgery.  Plan: Plan for removal of 1 of the stents today followed by serial move all of the subsequent stents tomorrow.  Hollice Espy, M.D.

## 2018-09-03 NOTE — Anesthesia Preprocedure Evaluation (Signed)
Anesthesia Evaluation  Patient identified by MRN, date of birth, ID band Patient awake    Reviewed: Allergy & Precautions, NPO status , Patient's Chart, lab work & pertinent test results  History of Anesthesia Complications Negative for: history of anesthetic complications  Airway Mallampati: II  TM Distance: >3 FB Neck ROM: Full    Dental  (+) Partial Lower, Partial Upper   Pulmonary neg sleep apnea, COPD,  COPD inhaler, Current Smoker,    breath sounds clear to auscultation- rhonchi (-) wheezing      Cardiovascular hypertension, Pt. on medications (-) CAD, (-) Past MI, (-) Cardiac Stents and (-) CABG  Rhythm:Regular Rate:Normal - Systolic murmurs and - Diastolic murmurs    Neuro/Psych  Headaches, neg Seizures PSYCHIATRIC DISORDERS Anxiety Depression    GI/Hepatic Neg liver ROS, GERD  ,  Endo/Other  diabetes, Oral Hypoglycemic AgentsHypothyroidism   Renal/GU negative Renal ROS     Musculoskeletal  (+) Arthritis , Fibromyalgia -  Abdominal (+) + obese,   Peds  Hematology negative hematology ROS (+)   Anesthesia Other Findings Past Medical History: No date: Anxiety No date: Arthritis No date: Cancer ([facility])     Comment:  skin cancer, removed No date: COPD (chronic obstructive pulmonary disease) (HCC) No date: Depression No date: Diabetes mellitus without complication (HCC) No date: Dysrhythmia     Comment:  occasional pvc's. not being treated. No date: Fatigue No date: GERD (gastroesophageal reflux disease) No date: Headache No date: Hypertension No date: Hypothyroidism No date: Thyroid disease   Reproductive/Obstetrics                             Anesthesia Physical Anesthesia Plan  ASA: III  Anesthesia Plan: General   Post-op Pain Management:    Induction: Intravenous  PONV Risk Score and Plan: 1 and Ondansetron and Dexamethasone  Airway Management Planned: Oral  ETT  Additional Equipment:   Intra-op Plan:   Post-operative Plan: Extubation in OR  Informed Consent: I have reviewed the patients History and Physical, chart, labs and discussed the procedure including the risks, benefits and alternatives for the proposed anesthesia with the patient or authorized representative who has indicated his/her understanding and acceptance.     Dental advisory given  Plan Discussed with: CRNA and Anesthesiologist  Anesthesia Plan Comments:         Anesthesia Quick Evaluation

## 2018-09-03 NOTE — Anesthesia Procedure Notes (Signed)
Procedure Name: Intubation Date/Time: 09/03/2018 10:11 AM Performed by: Lowry Bowl, CRNA Pre-anesthesia Checklist: Patient identified, Emergency Drugs available, Suction available, Patient being monitored and Timeout performed Patient Re-evaluated:Patient Re-evaluated prior to induction Oxygen Delivery Method: Circle system utilized Preoxygenation: Pre-oxygenation with 100% oxygen Induction Type: IV induction and Cricoid Pressure applied Ventilation: Mask ventilation without difficulty Laryngoscope Size: Mac and 3 Grade View: Grade II Tube type: Oral Tube size: 7.0 mm Number of attempts: 1 Airway Equipment and Method: Stylet Placement Confirmation: ETT inserted through vocal cords under direct vision,  positive ETCO2 and breath sounds checked- equal and bilateral Secured at: 22 cm Tube secured with: Tape Dental Injury: Teeth and Oropharynx as per pre-operative assessment

## 2018-09-03 NOTE — Transfer of Care (Signed)
Immediate Anesthesia Transfer of Care Note  Patient: Linda Bradford  Procedure(s) Performed: Procedure(s) with comments: LAPAROSCOPIC SIGMOID COLECTOMY (N/A) - Will need bilateral lighted ureteral stents to be placed by Urology at the beginning of case.  Will also need Dr. Genevive Bi to assist. POSSIBLE COLOSTOMY (N/A) CYSTOSCOPY WITH STENT PLACEMENT-LIGHTED STENTS (Bilateral)  Patient Location: PACU  Anesthesia Type:General  Level of Consciousness: sedated  Airway & Oxygen Therapy: Patient Spontanous Breathing and Patient connected to face mask oxygen  Post-op Assessment: Report given to RN and Post -op Vital signs reviewed and stable  Post vital signs: Reviewed and stable  Last Vitals:  Vitals:   09/03/18 0758 09/03/18 1601  BP: (!) 121/58 (!) 147/61  Pulse: 95 87  Resp: 15 17  Temp: (!) 36.1 C 37.1 C  SpO2: 24% 82%    Complications: No apparent anesthesia complications

## 2018-09-04 ENCOUNTER — Encounter: Payer: Self-pay | Admitting: Surgery

## 2018-09-04 LAB — CBC WITH DIFFERENTIAL/PLATELET
Abs Immature Granulocytes: 0.03 10*3/uL (ref 0.00–0.07)
Basophils Absolute: 0 10*3/uL (ref 0.0–0.1)
Basophils Relative: 0 %
Eosinophils Absolute: 0 10*3/uL (ref 0.0–0.5)
Eosinophils Relative: 0 %
HCT: 40 % (ref 36.0–46.0)
Hemoglobin: 12.8 g/dL (ref 12.0–15.0)
Immature Granulocytes: 0 %
Lymphocytes Relative: 19 %
Lymphs Abs: 1.9 10*3/uL (ref 0.7–4.0)
MCH: 29.6 pg (ref 26.0–34.0)
MCHC: 32 g/dL (ref 30.0–36.0)
MCV: 92.4 fL (ref 80.0–100.0)
Monocytes Absolute: 0.9 10*3/uL (ref 0.1–1.0)
Monocytes Relative: 9 %
Neutro Abs: 7.2 10*3/uL (ref 1.7–7.7)
Neutrophils Relative %: 72 %
Platelets: 321 10*3/uL (ref 150–400)
RBC: 4.33 MIL/uL (ref 3.87–5.11)
RDW: 13.3 % (ref 11.5–15.5)
WBC: 10 10*3/uL (ref 4.0–10.5)
nRBC: 0 % (ref 0.0–0.2)

## 2018-09-04 LAB — GLUCOSE, CAPILLARY
Glucose-Capillary: 130 mg/dL — ABNORMAL HIGH (ref 70–99)
Glucose-Capillary: 145 mg/dL — ABNORMAL HIGH (ref 70–99)
Glucose-Capillary: 104 mg/dL — ABNORMAL HIGH (ref 70–99)
Glucose-Capillary: 99 mg/dL (ref 70–99)

## 2018-09-04 LAB — BASIC METABOLIC PANEL
Anion gap: 6 (ref 5–15)
BUN: 20 mg/dL (ref 8–23)
CO2: 25 mmol/L (ref 22–32)
Calcium: 8.8 mg/dL — ABNORMAL LOW (ref 8.9–10.3)
Chloride: 107 mmol/L (ref 98–111)
Creatinine, Ser: 0.7 mg/dL (ref 0.44–1.00)
GFR calc Af Amer: >60 mL/min (ref >60)
GFR calc non Af Amer: >60 mL/min (ref >60)
Glucose, Bld: 139 mg/dL — ABNORMAL HIGH (ref 70–99)
Potassium: 4.1 mmol/L (ref 3.5–5.1)
Sodium: 138 mmol/L (ref 135–145)

## 2018-09-04 LAB — MAGNESIUM: Magnesium: 2 mg/dL (ref 1.7–2.4)

## 2018-09-04 NOTE — Progress Notes (Signed)
[facility] Hospital Day(s): 1.   Post op day(s): 1 Day Post-Op.   Interval History: Patient seen and examined, no acute events or new complaints overnight. Patient reports that she is having right > left abdominal soreness. No complaints of fever, chills, nausea, or emesis. She has been NPO until this point. Foley has remained in place. No other complaints this morning.   Review of Systems:  Constitutional: denies fever, chills  Respiratory: denies any shortness of breath  Cardiovascular: denies chest pain or palpitations  Gastrointestinal: + abdominal pain, denied N/V, or diarrhea/and bowel function as per interval history Integumentary: denies any other rashes or skin discolorations except surgical incisions   Vital signs in last 24 hours: [min-max] current  Temp:  [97.5 F (36.4 C)-98.7 F (37.1 C)] 98 F (36.7 C) (02/04 0443) Pulse Rate:  [72-87] 72 (02/04 0443) Resp:  [11-21] 20 (02/04 0443) BP: (138-168)/(59-77) 138/64 (02/04 0443) SpO2:  [94 %-99 %] 97 % (02/04 0443) Weight:  [95.9 kg] 95.9 kg (02/03 1904)     Height: 5' 9.5" (176.5 cm) Weight: 95.9 kg BMI (Calculated): 30.79   Intake/Output this shift:  Total I/O In: -  Out: 20 [Drains:20]   Intake/Output last 2 shifts:  @IOLAST2SHIFTS @   Physical Exam:  Constitutional: alert, cooperative and no distress  Respiratory: breathing non-labored at rest   Gastrointestinal: soft, incisional tenderness, and non-distended. JP in LUQ with serosanguinous drainage in bulb Integumentary: Laparoscopic and lower midline incisions are all CDI, no erythema, no drainage.    Labs:  CBC Latest Ref Rng & Units 09/04/2018 08/28/2018 07/10/2018  WBC 4.0 - 10.5 K/uL 10.0 5.8 5.2  Hemoglobin 12.0 - 15.0 g/dL 12.8 13.5 11.2(L)  Hematocrit 36.0 - 46.0 % 40.0 41.9 34.0(L)  Platelets 150 - 400 K/uL 321 338 406(H)   CMP Latest Ref Rng & Units 09/04/2018 08/28/2018 07/06/2018  Glucose 70 - 99 mg/dL 139(H)  112(H) 136(H)  BUN 8 - 23 mg/dL 20 14 9   Creatinine 0.44 - 1.00 mg/dL 0.70 0.63 0.62  Sodium 135 - 145 mmol/L 138 135 139  Potassium 3.5 - 5.1 mmol/L 4.1 4.2 3.5  Chloride 98 - 111 mmol/L 107 100 106  CO2 22 - 32 mmol/L 25 28 26   Calcium 8.9 - 10.3 mg/dL 8.8(L) 9.6 8.1(L)  Total Protein 6.5 - 8.1 g/dL - 7.8 -  Total Bilirubin 0.3 - 1.2 mg/dL - 0.5 -  Alkaline Phos 38 - 126 U/L - 93 -  AST 15 - 41 U/L - 18 -  ALT 0 - 44 U/L - 16 -     Imaging studies: No new pertinent imaging studies   Assessment/Plan:  63 y.o. female with mild incisional soreness but otherwise doing well 1 Day Post-Op s/p laparoscopic sigmopidcolectomy for recurrent diverticulitis with abscesses, complicated by pertinent comorbidities including anxiety, DM, HTN, hypothyroidism, COPD, obesity, and current tobacco abuse (smoking). .   - Advance to clear liquids, wean IVF  - Pain control PRN (minimize narcotics), antiemetics PRN  - Continue to monitor daily labs, abdominal exam, and on going bowel function  - Will d/c left ureteral stent today, continue JP, continue foley    - Medical management of comorbidities   - Mobilization encouraged   - DVT Prophylaxis   All of the above findings and recommendations were discussed with the patient, and the medical team, and all of patient's questions were answered to their expressed satisfaction.  -- Edison Simon, PA-C [facility] 09/04/2018, 10:01  AM [phone] M-F: 7am - 4pm

## 2018-09-04 NOTE — Care Management Note (Signed)
Case Management Note  Patient Details  Name: Linda Bradford MRN: [id number] Date of Birth: 08/17/1955  Subjective/Objective:                 Presented with elective surgery for diverticular abscess.  Laparoscopic sigmoidectomy and urology placed preoperative lighted ureteral stents which have been removed.  Prior to this admission patient idependent in all her adls .  No issues accessing medical care. POD 1- pain controlled. Start clear liquids today.  Has foley and jp drain  Action/Plan:  Discussed mobilizing patient during progression   Expected Discharge Date:                  Expected Discharge Plan:     In-House Referral:     Discharge planning Services     Post Acute Care Choice:    Choice offered to:     DME Arranged:    DME Agency:     HH Arranged:    HH Agency:     Status of Service:     If discussed at [employer], dates discussed:    Additional Comments:  Katrina Stack, RN 09/04/2018, 3:06 PM

## 2018-09-05 ENCOUNTER — Encounter: Payer: Self-pay | Admitting: Surgery

## 2018-09-05 LAB — GLUCOSE, CAPILLARY
Glucose-Capillary: 115 mg/dL — ABNORMAL HIGH (ref 70–99)
Glucose-Capillary: 87 mg/dL (ref 70–99)
Glucose-Capillary: 94 mg/dL (ref 70–99)
Glucose-Capillary: 113 mg/dL — ABNORMAL HIGH (ref 70–99)
Glucose-Capillary: 108 mg/dL — ABNORMAL HIGH (ref 70–99)

## 2018-09-05 LAB — SURGICAL PATHOLOGY

## 2018-09-05 NOTE — Progress Notes (Signed)
[facility] Hospital Day(s): 2.   Post op day(s): 2 Days Post-Op.   Interval History: Patient seen and examined, no acute events or new complaints overnight. Patient reports continue abdominal soreness around her incisions but this has somewhat improved. No complaints of fevers, chills, CP, SOB, nausea, or emesis. She does endorse passing flatus but no BM to this point. She was able to tolerate clear liquids yesterday.   Review of Systems:  Constitutional: denies fever, chills  Respiratory: denies any shortness of breath  Cardiovascular: denies chest pain or palpitations  Gastrointestinal: + abdominal soreness, denied N/V, or diarrhea/and bowel function as per interval history Integumentary: denies any other rashes or skin discolorations except surgical incisions  Vital signs in last 24 hours: [min-max] current  Temp:  [97.5 F (36.4 C)-98.2 F (36.8 C)] 97.5 F (36.4 C) (02/05 0532) Pulse Rate:  [77-84] 84 (02/05 0800) Resp:  [18-20] 18 (02/05 0800) BP: (115-155)/(58-110) 147/73 (02/05 0800) SpO2:  [94 %-96 %] 96 % (02/05 0800)     Height: 5' 9.5" (176.5 cm) Weight: 95.9 kg BMI (Calculated): 30.79   Intake/Output this shift:  No intake/output data recorded.   Intake/Output last 2 shifts:  @IOLAST2SHIFTS @   Physical Exam:  Constitutional: alert, cooperative and no distress  Respiratory: breathing non-labored at rest   Gastrointestinal: soft, incisional tenderness, and non-distended. JP in LUQ with serosanguinous drainage in bulb Integumentary: Laparoscopic and lower midline incisions are all CDI, no erythema, no drainage.    Labs:  CBC Latest Ref Rng & Units 09/04/2018 08/28/2018 07/10/2018  WBC 4.0 - 10.5 K/uL 10.0 5.8 5.2  Hemoglobin 12.0 - 15.0 g/dL 12.8 13.5 11.2(L)  Hematocrit 36.0 - 46.0 % 40.0 41.9 34.0(L)  Platelets 150 - 400 K/uL 321 338 406(H)   CMP Latest Ref Rng & Units 09/04/2018 08/28/2018 07/06/2018  Glucose 70 - 99  mg/dL 139(H) 112(H) 136(H)  BUN 8 - 23 mg/dL 20 14 9   Creatinine 0.44 - 1.00 mg/dL 0.70 0.63 0.62  Sodium 135 - 145 mmol/L 138 135 139  Potassium 3.5 - 5.1 mmol/L 4.1 4.2 3.5  Chloride 98 - 111 mmol/L 107 100 106  CO2 22 - 32 mmol/L 25 28 26   Calcium 8.9 - 10.3 mg/dL 8.8(L) 9.6 8.1(L)  Total Protein 6.5 - 8.1 g/dL - 7.8 -  Total Bilirubin 0.3 - 1.2 mg/dL - 0.5 -  Alkaline Phos 38 - 126 U/L - 93 -  AST 15 - 41 U/L - 18 -  ALT 0 - 44 U/L - 16 -     Imaging studies: No new pertinent imaging studies   Assessment/Plan: 63 y.o. female with mild incisional soreness but otherwise doing well 2 Days Post-Op s/p laparoscopic sigmopidcolectomy for recurrent diverticulitis with abscesses, complicated by pertinent comorbidities including anxiety, DM, HTN, hypothyroidism, COPD, obesity, and current tobacco abuse (smoking). .   - Advance to full liquids, discontinue IVF as diet advances   - Discontinue foley catheter   - Pain control PRN (minimize narcotics), antiemetics PRN             - Continue to monitor daily labs, abdominal exam, on going bowel function, JP output (~60 cc serosanguinous per chert)             - Medical management of comorbidities              - Mobilization encouraged              - DVT Prophylaxis  All of the above findings and recommendations were discussed with the patient, and the medical team, and all of patient's questions were answered to her expressed satisfaction.  -- Edison Simon, PA-C [facility] 09/05/2018, 9:39 AM [phone] M-F: 7am - 4pm

## 2018-09-06 LAB — GLUCOSE, CAPILLARY
Glucose-Capillary: 100 mg/dL — ABNORMAL HIGH (ref 70–99)
Glucose-Capillary: 218 mg/dL — ABNORMAL HIGH (ref 70–99)

## 2018-09-06 MED ORDER — OXYCODONE HCL 5 MG PO TABS: 5.0000 mg | ORAL_TABLET | ORAL | 0 refills | Status: DC | PRN

## 2018-09-06 NOTE — Discharge Instructions (Signed)
In addition to included general post-operative instructions for laparoscopic sigmoid colectomy,  Diet: Resume home diet.   Activity: No heavy lifting >20 pounds (children, pets, laundry, garbage) or strenuous activity until follow-up in 2 weeks, but light activity and walking are encouraged. Do not drive or drink alcohol if taking narcotic pain medications.  Wound care: You may shower/get incision wet with soapy water and pat dry (do not rub incisions), but no baths or submerging incision underwater until follow-up.   Medications: Resume all home medications. For mild to moderate pain: acetaminophen (Tylenol) or ibuprofen/naproxen (if no kidney disease). Combining Tylenol with alcohol can substantially increase your risk of causing liver disease. Narcotic pain medications, if prescribed, can be used for severe pain, though may cause nausea, constipation, and drowsiness. Do not combine Tylenol and Percocet (or similar) within a 6 hour period as Percocet (and similar) contain(s) Tylenol. If you do not need the narcotic pain medication, you do not need to fill the prescription.  Call office [phone] / [phone]) at any time if any questions, worsening pain, fevers/chills, bleeding, drainage from incision site, or other concerns.

## 2018-09-06 NOTE — Discharge Summary (Signed)
[facility] SURGICAL DISCHARGE SUMMARY  Patient ID: Linda Bradford MRN: [id number] DOB/AGE: 1956-01-28 63 y.o.  Admit date: 09/03/2018 Discharge date: 09/06/2018  Discharge Diagnoses Recurrent diverticulitis of the large intestines with abscess  Consultants None  Procedures 09/03/2018: Laparoscopic Sigmoidectomy, Laparoscopic takedown of splenix flexure, colorectal anastomosis.   HPI: This is a 63 y.o. femalewith diverticulitis with abscess, not improving with percutaneous drainage resulting in fistula, and now presents for sigmoidectomy  Hospital Course: Informed consent was obtained and documented, and patient underwent uneventful Laparoscopic Sigmoidectomy, Laparoscopic takedown of splenix flexure, colorectal anastomosis. (Dr Hampton Abbot, 09/03/2018).  Post-operatively, patient's pain improved/resolved and advancement of patient's diet and ambulation were well-tolerated. The remainder of patient's hospital course was essentially unremarkable, and discharge planning was initiated accordingly with patient safely able to be discharged home with appropriate discharge instructions, pain control, and outpatient follow-up after all of her questions were answered to her expressed satisfaction.  Discharge Condition: Good   Physical Examination:  Constitutional: alert, cooperative and no distress  Respiratory: breathing non-labored at rest  Gastrointestinal: soft,incisional tenderness, and non-distended. JP in LUQ with serosanguinous drainage in bulb Integumentary:Laparoscopic and lower midline incisions are all CDI, no erythema, no drainage.    Allergies as of 09/06/2018      Reactions   Dexilant [dexlansoprazole] Nausea Only, Other (See Comments)   Dizziness      Medication List    TAKE these medications   acetaminophen 500 MG tablet Commonly known as:  TYLENOL Take 1,000 mg by mouth every 6 (six) hours as needed for mild pain or moderate pain.   albuterol 108 (90  Base) MCG/ACT inhaler Commonly known as:  PROVENTIL HFA;VENTOLIN HFA Inhale 1 puff into the lungs every 4 (four) hours as needed.   benzonatate 200 MG capsule Commonly known as:  TESSALON Take 1 capsule (200 mg total) by mouth 3 (three) times daily as needed for cough.   BREO ELLIPTA 200-25 MCG/INH Aepb Generic drug:  fluticasone furoate-vilanterol INHALE 1 PUFF BY MOUTH ONCE DAILY What changed:  See the new instructions.   clonazePAM 0.5 MG tablet Commonly known as:  KLONOPIN TAKE 1/2 TABLET BY MOUTH TWICE DAILY AS NEEDED   diclofenac sodium 1 % Gel Commonly known as:  VOLTAREN Apply 4 g topically 4 (four) times daily as needed.   DULoxetine 60 MG capsule Commonly known as:  CYMBALTA TAKE 1 CAPSULE BY MOUTH TWICE DAILY   ezetimibe 10 MG tablet Commonly known as:  ZETIA TAKE 1 TABLET BY MOUTH ONCE DAILY   fluticasone 50 MCG/ACT nasal spray Commonly known as:  FLONASE USE 2 SPRAYS IN EACH NOSTRIL ONCE DAILY   gabapentin 300 MG capsule Commonly known as:  NEURONTIN TAKE 3 CAPSULES BY MOUTH ONCE EVERY MORNING AND 4 CAPSULES AT BEDTIME   glucose blood test strip Commonly known as:  CONTOUR NEXT TEST To check blood sugar once daily   ipratropium-albuterol 0.5-2.5 (3) MG/3ML Soln Commonly known as:  DUONEB Take 3 mLs by nebulization every 6 (six) hours as needed.   levothyroxine 75 MCG tablet Commonly known as:  SYNTHROID, LEVOTHROID TAKE 1 TABLET BY MOUTH ONCE DAILY ON AN EMPTY STOMACH. WAIT 30 MINUTES BEFORE TAKING OTHER MEDS.   losartan 50 MG tablet Commonly known as:  COZAAR TAKE 1 TABLET BY MOUTH ONCE DAILY   meclizine 25 MG tablet Commonly known as:  ANTIVERT Take 1 tablet (25 mg total) by mouth 3 (three) times daily as needed for dizziness.   meloxicam 15 MG tablet Commonly known as:  MOBIC TAKE 1 TABLET BY MOUTH ONCE DAILY.   metFORMIN 500 MG 24 hr tablet Commonly known as:  GLUCOPHAGE-XR TAKE 2 TABLETS BY MOUTH ONCE DAILY WITH SUPPER   nicotine 21  mg/24hr patch Commonly known as:  NICODERM CQ - dosed in mg/24 hours Place 1 patch (21 mg total) onto the skin daily.   ondansetron 4 MG tablet Commonly known as:  ZOFRAN Take 1 tablet (4 mg total) by mouth every 8 (eight) hours as needed.   oxyCODONE 5 MG immediate release tablet Commonly known as:  Oxy IR/ROXICODONE Take 1-2 tablets (5-10 mg total) by mouth every 4 (four) hours as needed for moderate pain.   polyethylene glycol packet Commonly known as:  MIRALAX / GLYCOLAX Take 17 g by mouth daily.   pyridOXINE 100 MG tablet Commonly known as:  VITAMIN B-6 Take 100 mg by mouth daily.   RA VITAMIN B-12 TR 1000 MCG Tbcr Generic drug:  Cyanocobalamin Take 1,000 mcg by mouth daily.   THEO-24 300 MG 24 hr capsule Generic drug:  theophylline TAKE 1 CAPSULE BY MOUTH ONCE DAILY   tiZANidine 4 MG capsule Commonly known as:  ZANAFLEX TAKE 1 CAPSULE BY MOUTH 3 TIMES DAILY What changed:    when to take this  reasons to take this   ergocalciferol 1.25 MG (50000 UT) capsule Commonly known as:  VITAMIN D2 50,000 Units once a week. Once a week on Wednesday What changed:  Another medication with the same name was changed. Make sure you understand how and when to take each.   Vitamin D (Ergocalciferol) 1.25 MG (50000 UT) Caps capsule Commonly known as:  DRISDOL TAKE 1 CAPSULE BY MOUTH EVERY 7 DAYS What changed:  See the new instructions.        Follow-up Information    Piscoya, Jacqulyn Bath, MD. Schedule an appointment as soon as possible for a visit in 1 week(s).   Specialty:  General Surgery Why:  s/p lap sigmoid colectomy....wednesday at [facility] information: [address] [place] [address] [place] , PA-C [facility]  09/06/2018, 2:57 PM [phone] M-F: 7am - 4pm

## 2018-09-07 ENCOUNTER — Other Ambulatory Visit: Payer: Self-pay | Admitting: Physician Assistant

## 2018-09-07 DIAGNOSIS — F419 Anxiety disorder, unspecified: Secondary | ICD-10-CM

## 2018-09-12 ENCOUNTER — Encounter: Payer: Self-pay | Admitting: Surgery

## 2018-09-12 ENCOUNTER — Other Ambulatory Visit: Payer: Self-pay

## 2018-09-12 ENCOUNTER — Ambulatory Visit (INDEPENDENT_AMBULATORY_CARE_PROVIDER_SITE_OTHER): Payer: BLUE CROSS/BLUE SHIELD | Admitting: Surgery

## 2018-09-12 VITALS — BP 144/81 | HR 105 | Temp 96.8°F | Ht 69.5 in | Wt 205.2 lb

## 2018-09-12 DIAGNOSIS — Z09 Encounter for follow-up examination after completed treatment for conditions other than malignant neoplasm: Secondary | ICD-10-CM

## 2018-09-12 DIAGNOSIS — K5720 Diverticulitis of large intestine with perforation and abscess without bleeding: Secondary | ICD-10-CM

## 2018-09-12 NOTE — Progress Notes (Signed)
09/12/2018  HPI: Linda Bradford is a 63 y.o. female s/p laparoscopic sigmoidectomy on 09/03/18 for diverticulitis with abscess and fistula.  She presents today for follow up.  She reports doing well, but tired and run down at times.  She also reports having diarrhea every other day with some mild perirectal discomfort with bowel movements. She tried Imodium to help with her diarrhea. Reports her drain output has become more serous and is emptying about 40 ml per day.  Vital signs: BP (!) 144/81   Pulse (!) 105   Temp (!) 96.8 F (36 C) (Temporal)   Ht 5' 9.5" (1.765 m)   Wt 205 lb 3.2 oz (93.1 kg)   SpO2 92%   BMI 29.87 kg/m    Physical Exam: Constitutional: No acute distress Abdomen:  Soft, obese, non-distended, appropriately tender to palpation over incisions and drain site.  Incisions are clean, dry, intact with staples in place.  Drain with serosanguinous fluid only.  Assessment/Plan: This is a 63 y.o. female s/p laparoscopic sigmoidectomy.  --Discussed with the patient that currently her symptoms are not uncommon after major surgery and removal of part of her colon.  Her body is adjusting and is not uncommon to have diarrhea.  This should continue to improve.  It is ok to try imodium or metamucil to help with her diarrhea.  Her drain is serosanguinous so I'm not concerned about a leak as her discomfort is only with bowel movement and not constant. --Staples removed in office and changed to steri strips. --Given that drain is still emptying 40 ml per day, will wait until next week to remove it.   --Follow up on 2/18 for drain removal.   Melvyn Neth, MD  [facility]

## 2018-09-12 NOTE — Patient Instructions (Addendum)
Patient is to return to the office on Tuesday to have drained removed in the [facility] office with Dr.Piscoya.   Call the office with any questions or concerns.

## 2018-09-13 ENCOUNTER — Ambulatory Visit (INDEPENDENT_AMBULATORY_CARE_PROVIDER_SITE_OTHER): Payer: BLUE CROSS/BLUE SHIELD

## 2018-09-13 ENCOUNTER — Telehealth: Payer: Self-pay | Admitting: *Deleted

## 2018-09-13 DIAGNOSIS — K5720 Diverticulitis of large intestine with perforation and abscess without bleeding: Secondary | ICD-10-CM

## 2018-09-13 NOTE — Telephone Encounter (Signed)
Patient called and stated that her drain is not draining, she started noticing this yesterday afternoon, she thinks that it is leaking on to the bandage. Please call and advise

## 2018-09-13 NOTE — Progress Notes (Signed)
Patient stated drain tube wasn't draining since yesterday.  Today in office drain tube was clogged with tissue.  Drain tube was stripped and drain begin working. Patient instructed to record output and bring to appointment next week. Denies fever, chills,nausea vomiting or pain at this time.

## 2018-09-13 NOTE — Telephone Encounter (Signed)
Patient states that she was seen in office yesterday and that her tube was stripped by the provider. She states that since then she has had no return drainage. He bulb is compressed as it is supposed to be and he has no fluid coming out. She states that she woke up today and her dressing around the drain is saturated. Patient instructed to come into the office today for a nurse check to see what may be wrong. She states that she can come in after lunch.

## 2018-09-18 ENCOUNTER — Encounter: Payer: Self-pay | Admitting: Surgery

## 2018-09-18 ENCOUNTER — Other Ambulatory Visit: Payer: Self-pay

## 2018-09-18 ENCOUNTER — Ambulatory Visit (INDEPENDENT_AMBULATORY_CARE_PROVIDER_SITE_OTHER): Payer: BLUE CROSS/BLUE SHIELD | Admitting: Surgery

## 2018-09-18 VITALS — BP 141/79 | HR 94 | Temp 96.7°F | Resp 18 | Ht 69.0 in | Wt 204.0 lb

## 2018-09-18 DIAGNOSIS — Z09 Encounter for follow-up examination after completed treatment for conditions other than malignant neoplasm: Secondary | ICD-10-CM

## 2018-09-18 DIAGNOSIS — K5720 Diverticulitis of large intestine with perforation and abscess without bleeding: Secondary | ICD-10-CM

## 2018-09-18 NOTE — Progress Notes (Signed)
09/18/2018  HPI: Linda Bradford is a 63 y.o. female s/p laparoscopic sigmoidectomy on 09/03/18.  She was seen last week but her drain was having still high volume output.  She was also having diarrhea with each bowel movement.  Today she reports feeling better with formed but soft stools daily, and drain output about 35 ml/day.  She reports having rectal discomfort with bowel movement, but no abdominal pain.  Vital signs: BP (!) 141/79   Pulse 94   Temp (!) 96.7 F (35.9 C) (Oral)   Resp 18   Ht 5\' 9"  (1.753 m)   Wt 204 lb (92.5 kg)   SpO2 96%   BMI 30.13 kg/m    Physical Exam: Constitutional: No acute distress Abdomen:  Soft, non-distended, non-tender to palpation.  Incisions healing well, clean, dry, intact.  Drain in place with serosanguinous fluid, no purulence or foul odor. Rectal:  External exam reveals no thrombosed hemorrhoids, and only mildly enlarged right posterior internal column on digital exam.  Good rectal tone without any fissures noted or blood on glove.  Assessment/Plan: This is a 63 y.o. female s/p laparoscopic sigmoidectomy.  --Drain removed without complications.  Dry gauze in place.  Change daily until wound closed. --On further exam, no evidence of hemorrhoid issues that could be contributing to her rectal pain.  I believe her pain is too low to be concerned with her colorectal anastomosis from her surgery and her drain does not show any evidence for concern either.  This may be related to scar tissue as the stool is passing through her rectum.  If so, this should improve with time. --Patient will need a colonoscopy in a few months as this was never done prior to her surgery.  We'll set up referral to GI for colonoscopy in May and follow up with me afterwards.   Melvyn Neth, [facility]

## 2018-09-18 NOTE — Patient Instructions (Addendum)
   We have sent the referral to [facility] and someone from their office will call to schedule an appointment.   Dr.Piscoya would like to follow up with you after you have your Colonoscopy so please call our office to schedule an appointment after your Colonoscopy.

## 2018-09-27 ENCOUNTER — Encounter: Payer: Self-pay | Admitting: *Deleted

## 2018-09-27 NOTE — Addendum Note (Signed)
Addended by: Lesly Rubenstein on: 09/27/2018 10:19 AM   Modules accepted: Orders

## 2018-10-01 ENCOUNTER — Ambulatory Visit (INDEPENDENT_AMBULATORY_CARE_PROVIDER_SITE_OTHER): Payer: BLUE CROSS/BLUE SHIELD | Admitting: Physician Assistant

## 2018-10-01 ENCOUNTER — Encounter: Payer: Self-pay | Admitting: Physician Assistant

## 2018-10-01 VITALS — BP 145/81 | HR 103 | Temp 97.9°F | Resp 16 | Wt 205.8 lb

## 2018-10-01 DIAGNOSIS — M79662 Pain in left lower leg: Secondary | ICD-10-CM | POA: Diagnosis not present

## 2018-10-01 DIAGNOSIS — S86899A Other injury of other muscle(s) and tendon(s) at lower leg level, unspecified leg, initial encounter: Secondary | ICD-10-CM | POA: Diagnosis not present

## 2018-10-01 DIAGNOSIS — M79661 Pain in right lower leg: Secondary | ICD-10-CM | POA: Diagnosis not present

## 2018-10-01 NOTE — Patient Instructions (Signed)
Shin Splints    Shin splints is a painful condition that is felt either on the bone that is located in the front of the lower leg (tibia or shin bone) or in the muscles on either side of the bone. This condition happens when physical activities lead to inflammation of the muscles, tendons, and the thin layer of tissue that covers the shin bone. It may result from participating in sports or other intense exercise.  What are the causes?  This condition may be caused by:   Overuse of muscles.   Repetitive activities.   Flat feet or rigid arches.  Activities that could contribute to shin splints include:   Having a sudden increase in exercise time.   Starting a new, intense activity.   Running up hills or long distances.   Playing sports that involve sudden starts and stops.   Not warming up before activity.   Wearing old or worn-out shoes.  What are the signs or symptoms?  The main symptom of this condition is pain that occurs:   On the front of the lower leg.   In the muscles on either side of the shin bone.   While exercising or at rest.  How is this diagnosed?  This condition may be diagnosed based on:   A physical exam.   Your symptoms.   An observation of you while you are walking or running.   X-rays or other imaging tests. These may be done to rule out other problems.  How is this treated?  Treatment for this condition depends on your age, history, overall health, and how bad the pain is. Most cases of shin splints can be managed by doing one or more of the following:   Resting.   Reducing the length and intensity of your exercise.   Stopping the activity that causes shin pain.   Taking medicines to control the inflammation.   Icing, massaging, stretching, and strengthening the affected area.   Wearing shoes that have rigid heels, shock absorption, and a good arch support.  For severe shin pain, your health care provider may recommend that you use crutches to avoid putting weight on your  legs.  Follow these instructions at home:  Medicines   Take over-the-counter and prescription medicines only as told by your health care provider.   Do not drive or use heavy machinery while taking prescription pain medicine.  Managing pain, stiffness, and swelling          If directed, apply ice to the painful area. Icing can help to relieve pain and swelling.  ? Put ice in a plastic bag.  ? Place a towel between your skin and the bag.  ? Leave the ice on for 20 minutes, 2-3 times a day.   If directed, apply heat to the painful area before stretching exercises, or as told by your health care provider. Heat can help to relax your muscles. Use the heat source that your health care provider recommends, such as a moist heat pack or a heating pad.  ? Place a towel between your skin and the heat source.  ? Leave the heat on for 20-30 minutes.  ? Remove the heat if your skin turns bright red. This is especially important if you are unable to feel pain, heat, or cold. You may have a greater risk of getting burned.   Massage, stretch, and strengthen the affected area as directed by your health care provider.   Wear compression sleeves or   socks as told by your health care provider.   Raise (elevate) your legs above the level of your heart while you are sitting or lying down.  Activity   Rest as needed. Return to activity gradually as told by your health care provider.   When you start exercising again, begin with non-weight-bearing exercises, such as cycling or swimming.   Stop running if the pain returns.   Warm up properly before exercising.   Run on a surface that is level and fairly firm.   Gradually change the intensity of an exercise.   If you increase your running distance, add only 5-10% to your distance each week. This means that if you are running 5 miles this week, you should only increase your run by - mile for next week.  General instructions   Wear shoes that have rigid heels, shock absorption,  and a good arch support.   Change your athletic shoes every 6 months, or every 350-450 miles.   Keep all follow-up visits as told by your health care provider. This is important.  Contact a health care provider if:   Your symptoms continue, or they worsen even after treatment.   The location, intensity, or type of pain changes over time.   You have swelling in your lower leg that gets worse.   Your shin becomes red and feels warm.  Get help right away if:   You have severe pain.   You have trouble walking.  Summary   Shin splints happens when physical activities lead to inflammation of the muscles, tendons, and the thin layer of tissue that covers the shin bone.   Treatments may include medicines, resting, and icing.   Return to activity gradually as directed by your health care provider.   Make sure you know what symptoms should cause you to contact your health care provider.  This information is not intended to replace advice given to you by your health care provider. Make sure you discuss any questions you have with your health care provider.  Document Released: 07/15/2000 Document Revised: 08/07/2017 Document Reviewed: 08/07/2017  Elsevier Interactive Patient Education  2019 [employer]      Shin Splints Rehab  Ask your health care provider which exercises are safe for you. Do exercises exactly as told by your health care provider and adjust them as directed. It is normal to feel mild stretching, pulling, tightness, or discomfort as you do these exercises, but you should stop right away if you feel sudden pain or your pain gets worse.Do not begin these exercises until told by your health care provider.  Stretching and range of motion exercise  This exercise warms up your muscles and joints and improves the movement and flexibility of your lower leg. This exercise also helps to relieve pain, numbness, and tingling.  Exercise A: Calf stretch, standing    1. Stand with the ball of your left / right  foot on a step. The ball of your foot is on the walking surface, right under your toes.  2. Keep your other foot firmly on the same step.  3. Hold onto the wall, a railing, or a chair for balance.  4. Slowly lift your other foot, allowing your body weight to press your left / right heel down over the edge of the step. You should feel a stretch in your left / right calf.  5. Hold this position for __________ seconds.  6. Repeat this exercise with a slight bend in your   left / right knee.  Repeat __________ times with your left / right knee straight and __________ times with your left / right knee bent. Complete this exercise __________ times a day.  Strengthening exercises  These exercises build strength and endurance in your lower leg. Endurance is the ability to use your muscles for a long time, even after they get tired.  Exercise B: Dorsiflexion    1. Secure a rubber exercise band or tubing to a fixed object, such as a table leg or a pole.  2. Secure the other end of the band around your left / right foot.  3. Sit on the floor, facing the fixed object. The band should be slightly tense when your foot is relaxed.  4. Slowly use your ankle muscles to pull your foot toward you.  5. Hold this position for __________ seconds.  6. Slowly release the tension in the band and return your foot to the starting position.  Repeat __________ times. Complete this exercise __________ times a day.  Exercise C: Ankle eversion with band  1. Secure one end of a rubber exercise band or tubing to a fixed object, such as a table leg or a pole, that will stay in place when the band is pulled.  2. Loop the other end of the band around the middle of your left / right foot.  3. Sit on the floor, facing the fixed object. The band should be slightly tense when your foot is relaxed.  4. Make fists with your hands and put them between your knees. This will focus your strengthening at your ankle.  5. Leading with your little toe, slowly push  your banded foot outward, away from your other leg. Make sure the band is positioned to resist the entire motion.  6. Hold this position for __________ seconds.  7. Control the tension in the band as you slowly return your foot to the starting position.  Repeat __________ times. Complete this exercise __________ times a day.  Exercise D: Ankle inversion with band  1. Secure one end of a rubber exercise band or tubing to a fixed object, such as a table leg or a pole, that will stay still when the band is pulled.  2. Loop the other end of the band around your left / right foot, just below your toes.  3. Sit on the floor, facing the fixed object. The band should be slightly tense when your foot is relaxed.  4. Make fists with your hands and put them between your knees. This will focus your strengthening at your ankle.  5. Leading with your big toe, slowly pull your banded foot inward, toward your other leg. Make sure the band is positioned to resist the entire motion.  6. Hold this position for __________ seconds.  7. Control the tension in the band as you slowly return your foot to the starting position.  Repeat __________ times. Complete this exercise __________ times a day.  Exercise E: Lateral walking with band  1. Stand in a long hallway.  2. Wrap a loop of exercise band around your legs, just above your knees.  3. Bend your knees gently and drop your hips down and back so your weight is over your heels.  4. Step to the side to move down the length of the hallway, keeping your toes pointed forward and keeping tension in the band.  5. Repeat, leading with your other leg.  Repeat __________ times. Complete this exercise __________ times   a day.  Balance exercise  This exercise will help improve your control of your foot and ankle when you are standing or walking.  Exercise F: Single leg stand  1. Without wearing shoes, stand near a railing or in a doorway. You may hold onto the railing or door frame as  needed.  2. Stand on your left / right foot. Keep your big toe down on the floor and try to keep your arch lifted.  3. If this exercise is too easy, you can try doing it with your eyes closed or while standing on a pillow.  4. Hold this position for __________ seconds.  Repeat __________ times. Complete this exercise __________ times a day.  This information is not intended to replace advice given to you by your health care provider. Make sure you discuss any questions you have with your health care provider.  Document Released: 07/18/2005 Document Revised: 03/22/2016 Document Reviewed: 04/16/2015  Elsevier Interactive Patient Education  2019 [employer]

## 2018-10-01 NOTE — Progress Notes (Signed)
Patient: Linda Bradford Female    DOB: August 06, 1955   63 y.o.   MRN: [id number] Visit Date: 10/01/2018  Today's Provider: Mar Daring, PA-C   Chief Complaint  Patient presents with  . Leg Pain   Subjective:    I,Linda Bradford,acting as a scribe for [employer], PA-C.,have documented all relevant documentation on the behalf of Mar Daring, PA-C,as directed by  Mar Daring, PA-C   HPI Patient here today c/o bilateral leg pain, x's 3 days. Patient reports pain wakes her up and has been taking gabapentin and tylenol. Feels symptoms in both legs. Recently had surgery for diverticulitis and worried it may be a blood clot. No swelling or redness. Patient has increased walking recently. Notices pain in the anterior legs with occasional numbness to the feet only with exercise. Symptoms improve following rest, but anterior lower extremity remain tender to touch.    Allergies  Allergen Reactions  . Dexilant [Dexlansoprazole] Nausea Only and Other (See Comments)    Dizziness     Current Outpatient Medications:  .  acetaminophen (TYLENOL) 500 MG tablet, Take 1,000 mg by mouth every 6 (six) hours as needed for mild pain or moderate pain. , Disp: , Rfl:  .  albuterol (PROVENTIL HFA;VENTOLIN HFA) 108 (90 Base) MCG/ACT inhaler, Inhale 1 puff into the lungs every 4 (four) hours as needed., Disp: 1 Inhaler, Rfl: 11 .  BD POSIFLUSH 0.9 % SOLN injection, , Disp: , Rfl:  .  BREO ELLIPTA 200-25 MCG/INH AEPB, INHALE 1 PUFF BY MOUTH ONCE DAILY (Patient taking differently: Inhale 1 puff into the lungs daily. ), Disp: 60 each, Rfl: 5 .  clonazePAM (KLONOPIN) 0.5 MG tablet, TAKE 1/2 TABLET BY MOUTH TWICE DAILY AS NEEDED, Disp: 180 tablet, Rfl: 1 .  Cyanocobalamin (RA VITAMIN B-12 TR) 1000 MCG TBCR, Take 1,000 mcg by mouth daily. , Disp: , Rfl:  .  diclofenac sodium (VOLTAREN) 1 % GEL, Apply 4 g topically 4 (four) times daily as needed., Disp: 100 g, Rfl: 1 .  DULoxetine  (CYMBALTA) 60 MG capsule, TAKE 1 CAPSULE BY MOUTH TWICE DAILY, Disp: 180 capsule, Rfl: 1 .  ergocalciferol (VITAMIN D2) 1.25 MG (50000 UT) capsule, 50,000 Units once a week. Once a week on Wednesday, Disp: , Rfl:  .  ezetimibe (ZETIA) 10 MG tablet, TAKE 1 TABLET BY MOUTH ONCE DAILY, Disp: 90 tablet, Rfl: 1 .  fluconazole (DIFLUCAN) 150 MG tablet, , Disp: , Rfl:  .  fluticasone (FLONASE) 50 MCG/ACT nasal spray, USE 2 SPRAYS IN EACH NOSTRIL ONCE DAILY, Disp: 48 g, Rfl: 1 .  gabapentin (NEURONTIN) 300 MG capsule, TAKE 3 CAPSULES BY MOUTH ONCE EVERY MORNING AND 4 CAPSULES AT BEDTIME, Disp: 450 capsule, Rfl: 1 .  glucose blood (CONTOUR NEXT TEST) test strip, To check blood sugar once daily, Disp: 100 each, Rfl: 12 .  ipratropium-albuterol (DUONEB) 0.5-2.5 (3) MG/3ML SOLN, Take 3 mLs by nebulization every 6 (six) hours as needed., Disp: 360 mL, Rfl: 11 .  levothyroxine (SYNTHROID, LEVOTHROID) 75 MCG tablet, TAKE 1 TABLET BY MOUTH ONCE DAILY ON AN EMPTY STOMACH. WAIT 30 MINUTES BEFORE TAKING OTHER MEDS., Disp: 90 tablet, Rfl: 1 .  losartan (COZAAR) 50 MG tablet, TAKE 1 TABLET BY MOUTH ONCE DAILY, Disp: 90 tablet, Rfl: 2 .  meclizine (ANTIVERT) 25 MG tablet, Take 1 tablet (25 mg total) by mouth 3 (three) times daily as needed for dizziness., Disp: 30 tablet, Rfl: 0 .  meloxicam (  MOBIC) 15 MG tablet, TAKE 1 TABLET BY MOUTH ONCE DAILY., Disp: 90 tablet, Rfl: 1 .  metFORMIN (GLUCOPHAGE-XR) 500 MG 24 hr tablet, TAKE 2 TABLETS BY MOUTH ONCE DAILY WITH SUPPER, Disp: 180 tablet, Rfl: 1 .  neomycin (MYCIFRADIN) 500 MG tablet, , Disp: , Rfl:  .  ondansetron (ZOFRAN) 4 MG tablet, Take 1 tablet (4 mg total) by mouth every 8 (eight) hours as needed., Disp: 20 tablet, Rfl: 1 .  pyridOXINE (VITAMIN B-6) 100 MG tablet, Take 100 mg by mouth daily., Disp: , Rfl:  .  THEO-24 300 MG 24 hr capsule, TAKE 1 CAPSULE BY MOUTH ONCE DAILY, Disp: 90 capsule, Rfl: 1 .  tiZANidine (ZANAFLEX) 4 MG capsule, TAKE 1 CAPSULE BY MOUTH 3  TIMES DAILY (Patient taking differently: Take 4 mg by mouth at bedtime as needed for muscle spasms. ), Disp: 90 capsule, Rfl: 6 .  Vitamin D, Ergocalciferol, (DRISDOL) 50000 units CAPS capsule, TAKE 1 CAPSULE BY MOUTH EVERY 7 DAYS (Patient taking differently: Take 50,000 Units by mouth every Tuesday. ), Disp: 12 capsule, Rfl: 3 .  nicotine (NICODERM CQ - DOSED IN MG/24 HOURS) 21 mg/24hr patch, Place 1 patch (21 mg total) onto the skin daily. (Patient not taking: Reported on 10/01/2018), Disp: 56 patch, Rfl: 0 .  polyethylene glycol (MIRALAX / GLYCOLAX) packet, Take 17 g by mouth daily. (Patient not taking: Reported on 10/01/2018), Disp: 14 each, Rfl: 1  Review of Systems  Constitutional: Positive for fatigue.  Respiratory: Negative.   Cardiovascular: Negative.  Negative for leg swelling.  Musculoskeletal: Positive for myalgias.  Skin: Negative for color change and rash.  Neurological: Negative.     Social History   Tobacco Use  . Smoking status: Current Every Day Smoker    Packs/day: 1.00    Years: 45.00    Pack years: 45.00    Types: Cigarettes    Start date: 03/02/1980  . Smokeless tobacco: Never Used  . Tobacco comment: now at 5 cigs qd!! d/t chantix and patch  Substance Use Topics  . Alcohol use: No    Alcohol/week: 0.0 standard drinks      Objective:   BP (!) 145/81 (BP Location: Left Arm, Patient Position: Sitting, Cuff Size: Normal)   Pulse (!) 103   Temp 97.9 F (36.6 C) (Oral)   Resp 16   Wt 205 lb 12.8 oz (93.4 kg)   BMI 30.39 kg/m  Vitals:   10/01/18 1704  BP: (!) 145/81  Pulse: (!) 103  Resp: 16  Temp: 97.9 F (36.6 C)  TempSrc: Oral  Weight: 205 lb 12.8 oz (93.4 kg)     Physical Exam Vitals signs reviewed.  Constitutional:      General: She is not in acute distress.    Appearance: Normal appearance. She is well-developed. She is not ill-appearing or diaphoretic.  Neck:     Musculoskeletal: Normal range of motion and neck supple.     Thyroid: No  thyromegaly.     Vascular: No JVD.     Trachea: No tracheal deviation.  Cardiovascular:     Rate and Rhythm: Normal rate and regular rhythm.     Pulses:          Dorsalis pedis pulses are 2+ on the right side and 2+ on the left side.       Posterior tibial pulses are 2+ on the right side and 2+ on the left side.     Heart sounds: Normal heart sounds. No murmur. No friction  rub. No gallop.   Pulmonary:     Effort: Pulmonary effort is normal. No respiratory distress.     Breath sounds: Normal breath sounds. No wheezing or rales.  Musculoskeletal:        General: No swelling.     Right lower leg: She exhibits tenderness (over anterior compartment). She exhibits no bony tenderness and no swelling. No edema.     Left lower leg: She exhibits tenderness (over anterior compartment). She exhibits no bony tenderness and no swelling. No edema.  Lymphadenopathy:     Cervical: No cervical adenopathy.  Skin:    Capillary Refill: Capillary refill takes less than 2 seconds.     Findings: No erythema.  Neurological:     Mental Status: She is alert.         Assessment & Plan    1. Medial tibial stress syndrome, unspecified laterality, initial encounter Neurovascularly intact. Discussed rest, ice, stretches and using tylenol and NSAIDs for inflammation. Call if worsening.   2. Pain in both lower legs Will get [place] to R/O DVT, but lower suspicion due to clinical symptoms even though patient does have risk factors. I will f/u pending results.  - US Venous Img Lower Bilateral; Future     Mar Daring, PA-C  [facility]

## 2018-10-05 ENCOUNTER — Ambulatory Visit
Admission: RE | Admit: 2018-10-05 | Discharge: 2018-10-05 | Disposition: A | Discharge status: Home / Self Care | Payer: BLUE CROSS/BLUE SHIELD | Source: Ambulatory Visit | Attending: Physician Assistant | Admitting: Physician Assistant

## 2018-10-05 ENCOUNTER — Telehealth: Payer: Self-pay

## 2018-10-05 ENCOUNTER — Other Ambulatory Visit: Payer: Self-pay

## 2018-10-05 DIAGNOSIS — M79662 Pain in left lower leg: Secondary | ICD-10-CM | POA: Diagnosis not present

## 2018-10-05 DIAGNOSIS — M79661 Pain in right lower leg: Secondary | ICD-10-CM | POA: Insufficient documentation

## 2018-10-05 NOTE — Telephone Encounter (Signed)
-----   Message from Mar Daring, [place] sent at 10/05/2018  4:04 PM EST ----- No DVT noted bilaterally

## 2018-10-05 NOTE — Telephone Encounter (Signed)
Patient's husband advised as below.

## 2018-10-12 DIAGNOSIS — M13851 Other specified arthritis, right hip: Secondary | ICD-10-CM | POA: Diagnosis not present

## 2018-10-19 ENCOUNTER — Telehealth: Payer: Self-pay | Admitting: Physician Assistant

## 2018-10-19 NOTE — Telephone Encounter (Signed)
Please advise. KW 

## 2018-10-19 NOTE — Telephone Encounter (Signed)
Pt advised.   Thanks,   -Laura  

## 2018-10-19 NOTE — Telephone Encounter (Signed)
Glucerna, Boost glucose control

## 2018-10-19 NOTE — Telephone Encounter (Signed)
Pt had ten teeth pulled yesterday and wants to know what she can take as a supplement since she is diabetic and she can not eat solid foods right now.  CB#  [phone]  Thanks Con Memos

## 2018-10-20 ENCOUNTER — Encounter: Payer: Self-pay | Admitting: *Deleted

## 2018-10-22 ENCOUNTER — Other Ambulatory Visit: Payer: Self-pay | Admitting: Physician Assistant

## 2018-10-22 DIAGNOSIS — J418 Mixed simple and mucopurulent chronic bronchitis: Secondary | ICD-10-CM

## 2018-10-22 DIAGNOSIS — M2550 Pain in unspecified joint: Secondary | ICD-10-CM

## 2018-10-24 ENCOUNTER — Other Ambulatory Visit: Payer: Self-pay

## 2018-10-24 ENCOUNTER — Encounter: Payer: Self-pay | Admitting: Physician Assistant

## 2018-10-24 ENCOUNTER — Ambulatory Visit (INDEPENDENT_AMBULATORY_CARE_PROVIDER_SITE_OTHER): Payer: BLUE CROSS/BLUE SHIELD | Admitting: Physician Assistant

## 2018-10-24 VITALS — BP 111/74 | HR 105 | Temp 97.7°F | Resp 16 | Wt 214.0 lb

## 2018-10-24 DIAGNOSIS — M273 Alveolitis of jaws: Secondary | ICD-10-CM

## 2018-10-24 MED ORDER — TRAMADOL HCL 50 MG PO TABS: 50.0000 mg | ORAL_TABLET | ORAL | 0 refills | Status: DC | PRN

## 2018-10-24 NOTE — Patient Instructions (Signed)
Dental Dry Socket  Dry socket is a condition that can occur after a tooth is pulled (extracted). After a tooth is extracted, blood fills in the hole (socket) where the tooth had been. Normally, this blood thickens (clots). The blood clot protects the bone and nerves underneath until the gums grow over the open socket. Dry socket occurs when a blood clot does not form or is lost. The blood clot may become dislodged or break up into parts for some reason. Without the blood clot, the bone and nerves are exposed to air, food, liquid, or anything else that enters the mouth. This can be very painful. What are the causes? This condition may be caused by:  Blood not filling up the socket properly. This can occur if the extraction was difficult. Forceful pushing against the walls of the socket when the tooth is extracted can crush the walls. This keeps blood from flowing into the socket because the blood vessels have been crushed closed.  Anything that can dislodge a blood clot that is forming. For example, forceful spitting or sucking through a straw can pull a blood clot out of the socket. What increases the risk? The following factors may make you more likely to develop this condition:  Having lower teeth extracted.  Smoking.  Drinking alcohol.  Being female.  Being older in age.  Having poor oral hygiene.  Gum disease or an oral bacterial infection.  Taking birth control pills.  Having your wisdom teeth extracted. What are the signs or symptoms? Symptoms of this condition include:  Severe, constant pain that is dull and throbbing. The pain often begins 2-3 days after your tooth was extracted. The pain may last about a week.  Bad-smelling breath and a bad taste in your mouth.  Ear pain.  Exposed white bone in the tooth socket. How is this diagnosed? This condition is diagnosed based on your symptoms and an examination of the inside of your mouth. Your dentist will check to see if  you have a blood clot in your tooth socket or if your bone is visible.  You may also have imaging tests, including X-rays of your mouth and teeth. These may be done to rule out other health issues, such as a bone infection (osteomyelitis) or a problem with nearby teeth. How is this treated? This condition may clear up on its own after 1-2 weeks. You may get treatment for pain, which may include:  Soaking a gauze pad in a topical pain medicine and then placing it inside your tooth socket. This pad (dressing) may need to be replaced every 1-3 days until your pain eases.  Taking NSAIDs such as ibuprofen.  Taking a prescription pain medicine, if NSAIDs do not relieve your pain. Treatment may also include antibiotic medicine. Follow these instructions at home: Medicines  Take over-the-counter and prescription medicines only as told by your health care provider or dentist.  If you were prescribed an antibiotic medicine, take it as told by your health care provider or dentist. Do not stop taking the antibiotic even if you start to feel better.  Ask your health care provider or dentist if the medicine prescribed to you can cause constipation. You may need to take steps to prevent or treat constipation, such as: ? Drink enough fluid to keep your urine pale yellow. ? Take over-the-counter or prescription medicines. ? Eat foods that are high in fiber, such as beans, whole grains, and fresh fruits and vegetables. ? Limit foods that are high  in fat and processed sugars, such as fried or sweet foods. Eating and drinking  Do not drink through a straw until your health care provider or dentist says it is okay.  Eat cool, soft-textured foods as told by your health care provider or dentist.  Avoid hot drinks and spicy foods until your mouth has healed.  Do not drink carbonated beverages or alcohol. General instructions      Brush and floss your teeth every morning and night. Good oral hygiene is  important.  Do not use any products that contain nicotine or tobacco, such as cigarettes, e-cigarettes, and chewing tobacco. If you need help quitting, ask your health care provider.  Keep all follow-up visits as told by your health care provider or dentist. This is important. Contact a health care provider if:  You have pain that is not helped by medicine.  You have a fever.  Your mouth becomes tender and swollen. This could be a sign of infection. Get help right away if:  You have uncontrolled bleeding, very noticeable swelling, or severe pain.  You have difficulty swallowing.  You cannot open your mouth. Summary  Dry socket is a condition that can occur after a tooth is pulled (extracted). The bone and nerves become exposed to air, food, liquid, or anything else that enters the mouth. This can be very painful.  This condition may clear up on its own after 1-2 weeks. You may get treatment for pain. You may also get antibiotic medicine.  Take over-the-counter and prescription medicines only as told by your health care provider or dentist.  If you were prescribed an antibiotic medicine, take it as told by your health care provider or dentist. Do not stop taking the antibiotic even if you start to feel better.  Get help right away if you have uncontrolled bleeding, very noticeable swelling, or severe pain. This information is not intended to replace advice given to you by your health care provider. Make sure you discuss any questions you have with your health care provider. Document Released: 01/22/2003 Document Revised: 01/04/2018 Document Reviewed: 01/04/2018 Elsevier Interactive Patient Education  2019 [employer].

## 2018-10-24 NOTE — Progress Notes (Signed)
Patient: Linda Bradford Female    DOB: 1955/08/22   63 y.o.   MRN: [id number] Visit Date: 10/24/2018  Today's Provider: Mar Daring, PA-C   Chief Complaint  Patient presents with  . Dental Pain   Subjective:     HPI Patient here today c/o intense mouth pain. Pt reports she has some teeth pulled Wednesday of last week. Pain has been worsening since Friday. Mostly noted on the left side of her face. Reports pain is 8/10. Having trouble eating due to pain.  Patient reports her dentist office is closed. Patient reports she had ten teeth pulled. Patient reports that she has been taking tylenol and naproxen and BC Powders. She is a recovering alcoholic that has been clean for many years but she saw a crown royal commercial and stated "that was my drink of choice and this hurts so bad it made me want it."  Allergies  Allergen Reactions  . Dexilant [Dexlansoprazole] Nausea Only and Other (See Comments)    Dizziness     Current Outpatient Medications:  .  acetaminophen (TYLENOL) 500 MG tablet, Take 1,000 mg by mouth every 6 (six) hours as needed for mild pain or moderate pain. , Disp: , Rfl:  .  albuterol (PROVENTIL HFA;VENTOLIN HFA) 108 (90 Base) MCG/ACT inhaler, Inhale 1 puff into the lungs every 4 (four) hours as needed., Disp: 1 Inhaler, Rfl: 11 .  BD POSIFLUSH 0.9 % SOLN injection, , Disp: , Rfl:  .  clonazePAM (KLONOPIN) 0.5 MG tablet, TAKE 1/2 TABLET BY MOUTH TWICE DAILY AS NEEDED, Disp: 180 tablet, Rfl: 1 .  Cyanocobalamin (RA VITAMIN B-12 TR) 1000 MCG TBCR, Take 1,000 mcg by mouth daily. , Disp: , Rfl:  .  diclofenac sodium (VOLTAREN) 1 % GEL, Apply 4 g topically 4 (four) times daily as needed., Disp: 100 g, Rfl: 1 .  DULoxetine (CYMBALTA) 60 MG capsule, TAKE 1 CAPSULE BY MOUTH TWICE DAILY, Disp: 180 capsule, Rfl: 1 .  ergocalciferol (VITAMIN D2) 1.25 MG (50000 UT) capsule, 50,000 Units once a week. Once a week on Wednesday, Disp: , Rfl:  .  ezetimibe (ZETIA) 10 MG tablet,  TAKE 1 TABLET BY MOUTH ONCE DAILY, Disp: 90 tablet, Rfl: 1 .  fluconazole (DIFLUCAN) 150 MG tablet, , Disp: , Rfl:  .  fluticasone (FLONASE) 50 MCG/ACT nasal spray, USE 2 SPRAYS IN EACH NOSTRIL ONCE DAILY, Disp: 48 g, Rfl: 1 .  fluticasone furoate-vilanterol (BREO ELLIPTA) 200-25 MCG/INH AEPB, Inhale 1 puff into the lungs daily., Disp: 60 each, Rfl: 5 .  gabapentin (NEURONTIN) 300 MG capsule, TAKE 3 CAPSULES BY MOUTH ONCE EVERY MORNING AND 4 CAPSULES AT BEDTIME, Disp: 450 capsule, Rfl: 1 .  glucose blood (CONTOUR NEXT TEST) test strip, To check blood sugar once daily, Disp: 100 each, Rfl: 12 .  ipratropium-albuterol (DUONEB) 0.5-2.5 (3) MG/3ML SOLN, Take 3 mLs by nebulization every 6 (six) hours as needed., Disp: 360 mL, Rfl: 11 .  levothyroxine (SYNTHROID, LEVOTHROID) 75 MCG tablet, TAKE 1 TABLET BY MOUTH ONCE DAILY ON AN EMPTY STOMACH. WAIT 30 MINUTES BEFORE TAKING OTHER MEDS., Disp: 90 tablet, Rfl: 1 .  losartan (COZAAR) 50 MG tablet, TAKE 1 TABLET BY MOUTH ONCE DAILY, Disp: 90 tablet, Rfl: 2 .  meclizine (ANTIVERT) 25 MG tablet, Take 1 tablet (25 mg total) by mouth 3 (three) times daily as needed for dizziness., Disp: 30 tablet, Rfl: 0 .  meloxicam (MOBIC) 15 MG tablet, TAKE 1 TABLET BY MOUTH ONCE DAILY,  Disp: 90 tablet, Rfl: 1 .  metFORMIN (GLUCOPHAGE-XR) 500 MG 24 hr tablet, TAKE 2 TABLETS BY MOUTH ONCE DAILY WITH SUPPER, Disp: 180 tablet, Rfl: 1 .  neomycin (MYCIFRADIN) 500 MG tablet, , Disp: , Rfl:  .  nicotine (NICODERM CQ - DOSED IN MG/24 HOURS) 21 mg/24hr patch, Place 1 patch (21 mg total) onto the skin daily., Disp: 56 patch, Rfl: 0 .  ondansetron (ZOFRAN) 4 MG tablet, Take 1 tablet (4 mg total) by mouth every 8 (eight) hours as needed., Disp: 20 tablet, Rfl: 1 .  polyethylene glycol (MIRALAX / GLYCOLAX) packet, Take 17 g by mouth daily., Disp: 14 each, Rfl: 1 .  pyridOXINE (VITAMIN B-6) 100 MG tablet, Take 100 mg by mouth daily., Disp: , Rfl:  .  THEO-24 300 MG 24 hr capsule, TAKE 1  CAPSULE BY MOUTH ONCE DAILY, Disp: 90 capsule, Rfl: 1 .  tiZANidine (ZANAFLEX) 4 MG capsule, TAKE 1 CAPSULE BY MOUTH 3 TIMES DAILY (Patient taking differently: Take 4 mg by mouth at bedtime as needed for muscle spasms. ), Disp: 90 capsule, Rfl: 6 .  Vitamin D, Ergocalciferol, (DRISDOL) 50000 units CAPS capsule, TAKE 1 CAPSULE BY MOUTH EVERY 7 DAYS (Patient taking differently: Take 50,000 Units by mouth every Tuesday. ), Disp: 12 capsule, Rfl: 3  Review of Systems  Constitutional: Positive for appetite change.  HENT: Positive for dental problem.   Respiratory: Negative.   Cardiovascular: Negative.   Gastrointestinal: Negative.   Neurological: Negative.     Social History   Tobacco Use  . Smoking status: Current Every Day Smoker    Packs/day: 1.00    Years: 45.00    Pack years: 45.00    Types: Cigarettes    Start date: 03/02/1980  . Smokeless tobacco: Never Used  . Tobacco comment: now at 5 cigs qd!! d/t chantix and patch  Substance Use Topics  . Alcohol use: No    Alcohol/week: 0.0 standard drinks      Objective:   BP 111/74 (BP Location: Left Arm, Patient Position: Sitting, Cuff Size: Large)   Pulse (!) 105   Temp 97.7 F (36.5 C) (Oral)   Resp 16   Wt 214 lb (97.1 kg)   BMI 31.60 kg/m  Vitals:   10/24/18 1407  BP: 111/74  Pulse: (!) 105  Resp: 16  Temp: 97.7 F (36.5 C)  TempSrc: Oral  Weight: 214 lb (97.1 kg)     Physical Exam Vitals signs reviewed.  Constitutional:      General: She is not in acute distress.    Appearance: Normal appearance. She is well-developed. She is ill-appearing. She is not diaphoretic.  HENT:     Head: Normocephalic and atraumatic.     Right Ear: Hearing, tympanic membrane, ear canal and external ear normal.     Left Ear: Hearing, tympanic membrane, ear canal and external ear normal.     Nose: Nose normal.     Mouth/Throat:     Mouth: Mucous membranes are moist. Oral lesions present.     Dentition: Abnormal dentition. Has  dentures. Dental tenderness present. No dental caries or dental abscesses.     Pharynx: Uvula midline. No oropharyngeal exudate.   Eyes:     General: No scleral icterus.       Right eye: No discharge.        Left eye: No discharge.     Conjunctiva/sclera: Conjunctivae normal.     Pupils: Pupils are equal, round, and reactive to light.  Neck:  Musculoskeletal: Normal range of motion and neck supple.     Thyroid: No thyromegaly.     Trachea: No tracheal deviation.  Cardiovascular:     Rate and Rhythm: Normal rate and regular rhythm.     Heart sounds: Normal heart sounds. No murmur. No friction rub. No gallop.   Pulmonary:     Effort: Pulmonary effort is normal. No respiratory distress.     Breath sounds: Normal breath sounds. No stridor. No wheezing or rales.  Lymphadenopathy:     Cervical: No cervical adenopathy.  Skin:    General: Skin is warm and dry.  Neurological:     Mental Status: She is alert.         Assessment & Plan    1. Dry socket Suspect dry socket. Advised to use [place] specifically in the areas that remain open. Continue antiseptic mouthwash. Tramadol given for pain. Use with naproxen or IBU intermittently. I will do an evisit with her on Monday to make sure she is getting better. Call if tramadol does not control pain.  - traMADol (ULTRAM) 50 MG tablet; Take 1 tablet (50 mg total) by mouth every 4 (four) hours as needed for up to 5 days.  Dispense: 30 tablet; Refill: 0     Mar Daring, PA-C  [facility]

## 2018-10-29 ENCOUNTER — Telehealth: Payer: BLUE CROSS/BLUE SHIELD | Admitting: Family

## 2018-10-29 ENCOUNTER — Ambulatory Visit (INDEPENDENT_AMBULATORY_CARE_PROVIDER_SITE_OTHER): Payer: BLUE CROSS/BLUE SHIELD | Admitting: Physician Assistant

## 2018-10-29 ENCOUNTER — Encounter: Payer: Self-pay | Admitting: *Deleted

## 2018-10-29 ENCOUNTER — Telehealth: Payer: Self-pay

## 2018-10-29 VITALS — Wt 201.0 lb

## 2018-10-29 DIAGNOSIS — M273 Alveolitis of jaws: Secondary | ICD-10-CM | POA: Diagnosis not present

## 2018-10-29 DIAGNOSIS — K1379 Other lesions of oral mucosa: Secondary | ICD-10-CM

## 2018-10-29 MED ORDER — TRAMADOL HCL 50 MG PO TABS: 50.0000 mg | ORAL_TABLET | Freq: Four times a day (QID) | ORAL | 0 refills | Status: DC | PRN

## 2018-10-29 NOTE — Telephone Encounter (Signed)
Spoke with patient.

## 2018-10-29 NOTE — Progress Notes (Signed)
Based on what you shared with me, I feel your condition warrants further evaluation and I recommend that you be seen for a face to face office visit.     NOTE: If you entered your credit card information for this eVisit, you will not be charged. You may see a "hold" on your card for the $35 but that hold will drop off and you will not have a charge processed.  If you are having a true medical emergency please call 911.  If you need an urgent face to face visit, [place] has four urgent care centers for your convenience.    PLEASE NOTE: THE [facility] LOCATIONS AND URGENT CARE CLINICS DO NOT HAVE THE TESTING FOR CORONAVIRUS COVID19 AVAILABLE.  IF YOU FEEL YOU NEED THIS TEST YOU MUST GO TO A TRIAGE LOCATION AT ONE OF THE HOSPITAL EMERGENCY DEPARTMENTS   [email or web address] to reserve your spot online an avoid wait times  [facility] [address], [address] [place], [place] [address] 8 am to 8 pm Monday-Friday 10 am to 4 pm Saturday-Sunday *Across the street from [employer]  [facility]  [address] [place], [address] 8 am to 5 pm Monday-Friday * In the [facility]   The following sites will take your insurance:  . [facility]  [phone] Get Driving Directions Find a Provider at this Location  [address] [place], [place] [address] . 10 am to 8 pm Monday-Friday . 12 pm to 8 pm Saturday-Sunday   . [facility]  [phone] Get Driving Directions Find a Provider at this Location  [address], [address] [place], [place] [address] . 8 am to 8 pm Monday-Friday . 9 am to 6 pm Saturday . 11 am to 6 pm Sunday   . [facility] [place]  [phone] Get Driving Directions  [address] [place], [place] [address] . 8 am to 8 pm Monday-Friday . 8 am to 4 pm Saturday-Sunday   Your e-visit answers were  reviewed by a board certified advanced clinical practitioner to complete your personal care plan.  Thank you for using e-Visits. 

## 2018-10-29 NOTE — Telephone Encounter (Signed)
Patient is requesting that we re send the link for her Evisit today. Please call once it was resent. Contact info is correct. Thanks!

## 2018-10-29 NOTE — Progress Notes (Signed)
Patient: Linda Bradford Female    DOB: April 12, 1956   63 y.o.   MRN: [id number] Visit Date: 10/29/2018  Today's Provider: Mar Daring, PA-C    Virtual Visit via Video Note  I connected with Eulla Kochanowski on 10/29/18 at  2:00 PM EDT by a video enabled telemedicine application and verified that I am speaking with the correct person using two identifiers.   I discussed the limitations of evaluation and management by telemedicine and the availability of in person appointments. The patient expressed understanding and agreed to proceed.  Mar Daring, PA-C   Subjective:     HPI  Patient doing a virtual visit to follow-up dry socket. Patient had some teeth pulled out 03/18 and was seen for pain here on 03/25. Patient was advised to use Oragel specifically in the areas that remain open. Tramadol was given for pain. Use with naproxen or IBU. Patient reports that she still has some pain in the left lower jaw. This is the area where the tooth broke off during extraction and part of the tooth remains and keeps the nerve root exposed. She was to have returned last Thursday for complete removal but the office closed and she has not heard from them since.  Reports that she is having a sore throat that started this morning. Had a cinnamon candy and that helped. Sore throat no longer present at this time.    Allergies  Allergen Reactions  . Dexilant [Dexlansoprazole] Nausea Only and Other (See Comments)    Dizziness     Current Outpatient Medications:  .  acetaminophen (TYLENOL) 500 MG tablet, Take 1,000 mg by mouth every 6 (six) hours as needed for mild pain or moderate pain. , Disp: , Rfl:  .  albuterol (PROVENTIL HFA;VENTOLIN HFA) 108 (90 Base) MCG/ACT inhaler, Inhale 1 puff into the lungs every 4 (four) hours as needed., Disp: 1 Inhaler, Rfl: 11 .  BD POSIFLUSH 0.9 % SOLN injection, , Disp: , Rfl:  .  clonazePAM (KLONOPIN) 0.5 MG tablet, TAKE 1/2 TABLET BY MOUTH TWICE DAILY AS  NEEDED, Disp: 180 tablet, Rfl: 1 .  Cyanocobalamin (RA VITAMIN B-12 TR) 1000 MCG TBCR, Take 1,000 mcg by mouth daily. , Disp: , Rfl:  .  diclofenac sodium (VOLTAREN) 1 % GEL, Apply 4 g topically 4 (four) times daily as needed., Disp: 100 g, Rfl: 1 .  DULoxetine (CYMBALTA) 60 MG capsule, TAKE 1 CAPSULE BY MOUTH TWICE DAILY, Disp: 180 capsule, Rfl: 1 .  ergocalciferol (VITAMIN D2) 1.25 MG (50000 UT) capsule, 50,000 Units once a week. Once a week on Wednesday, Disp: , Rfl:  .  ezetimibe (ZETIA) 10 MG tablet, TAKE 1 TABLET BY MOUTH ONCE DAILY, Disp: 90 tablet, Rfl: 1 .  fluconazole (DIFLUCAN) 150 MG tablet, , Disp: , Rfl:  .  fluticasone (FLONASE) 50 MCG/ACT nasal spray, USE 2 SPRAYS IN EACH NOSTRIL ONCE DAILY, Disp: 48 g, Rfl: 1 .  fluticasone furoate-vilanterol (BREO ELLIPTA) 200-25 MCG/INH AEPB, Inhale 1 puff into the lungs daily., Disp: 60 each, Rfl: 5 .  gabapentin (NEURONTIN) 300 MG capsule, TAKE 3 CAPSULES BY MOUTH ONCE EVERY MORNING AND 4 CAPSULES AT BEDTIME, Disp: 450 capsule, Rfl: 1 .  glucose blood (CONTOUR NEXT TEST) test strip, To check blood sugar once daily, Disp: 100 each, Rfl: 12 .  ipratropium-albuterol (DUONEB) 0.5-2.5 (3) MG/3ML SOLN, Take 3 mLs by nebulization every 6 (six) hours as needed., Disp: 360 mL, Rfl: 11 .  levothyroxine (SYNTHROID,  LEVOTHROID) 75 MCG tablet, TAKE 1 TABLET BY MOUTH ONCE DAILY ON AN EMPTY STOMACH. WAIT 30 MINUTES BEFORE TAKING OTHER MEDS., Disp: 90 tablet, Rfl: 1 .  losartan (COZAAR) 50 MG tablet, TAKE 1 TABLET BY MOUTH ONCE DAILY, Disp: 90 tablet, Rfl: 2 .  meclizine (ANTIVERT) 25 MG tablet, Take 1 tablet (25 mg total) by mouth 3 (three) times daily as needed for dizziness., Disp: 30 tablet, Rfl: 0 .  meloxicam (MOBIC) 15 MG tablet, TAKE 1 TABLET BY MOUTH ONCE DAILY, Disp: 90 tablet, Rfl: 1 .  metFORMIN (GLUCOPHAGE-XR) 500 MG 24 hr tablet, TAKE 2 TABLETS BY MOUTH ONCE DAILY WITH SUPPER, Disp: 180 tablet, Rfl: 1 .  neomycin (MYCIFRADIN) 500 MG tablet, ,  Disp: , Rfl:  .  nicotine (NICODERM CQ - DOSED IN MG/24 HOURS) 21 mg/24hr patch, Place 1 patch (21 mg total) onto the skin daily., Disp: 56 patch, Rfl: 0 .  ondansetron (ZOFRAN) 4 MG tablet, Take 1 tablet (4 mg total) by mouth every 8 (eight) hours as needed., Disp: 20 tablet, Rfl: 1 .  polyethylene glycol (MIRALAX / GLYCOLAX) packet, Take 17 g by mouth daily., Disp: 14 each, Rfl: 1 .  pyridOXINE (VITAMIN B-6) 100 MG tablet, Take 100 mg by mouth daily., Disp: , Rfl:  .  THEO-24 300 MG 24 hr capsule, TAKE 1 CAPSULE BY MOUTH ONCE DAILY, Disp: 90 capsule, Rfl: 1 .  tiZANidine (ZANAFLEX) 4 MG capsule, TAKE 1 CAPSULE BY MOUTH 3 TIMES DAILY (Patient taking differently: Take 4 mg by mouth at bedtime as needed for muscle spasms. ), Disp: 90 capsule, Rfl: 6 .  traMADol (ULTRAM) 50 MG tablet, Take 1 tablet (50 mg total) by mouth every 4 (four) hours as needed for up to 5 days., Disp: 30 tablet, Rfl: 0 .  Vitamin D, Ergocalciferol, (DRISDOL) 50000 units CAPS capsule, TAKE 1 CAPSULE BY MOUTH EVERY 7 DAYS (Patient taking differently: Take 50,000 Units by mouth every Tuesday. ), Disp: 12 capsule, Rfl: 3  Review of Systems  Constitutional: Negative for fatigue and fever.  HENT: Positive for dental problem and mouth sores. Negative for trouble swallowing.   Respiratory: Negative.   Cardiovascular: Negative.   Gastrointestinal: Negative.   Neurological: Negative.     Social History   Tobacco Use  . Smoking status: Current Every Day Smoker    Packs/day: 1.00    Years: 45.00    Pack years: 45.00    Types: Cigarettes    Start date: 03/02/1980  . Smokeless tobacco: Never Used  . Tobacco comment: now at 5 cigs qd!! d/t chantix and patch  Substance Use Topics  . Alcohol use: No    Alcohol/week: 0.0 standard drinks      Objective:   There were no vitals taken for this visit. There were no vitals filed for this visit.   Physical Exam Vitals signs reviewed.  Constitutional:      General: She is not  in acute distress.    Appearance: Normal appearance. She is well-developed. She is not ill-appearing.  HENT:     Head: Normocephalic and atraumatic.     Mouth/Throat:     Mouth: Mucous membranes are moist.     Pharynx: Oropharynx is clear. No posterior oropharyngeal erythema.  Neck:     Musculoskeletal: Normal range of motion and neck supple.  Pulmonary:     Effort: Pulmonary effort is normal. No respiratory distress.  Neurological:     Mental Status: She is alert.  Psychiatric:  Behavior: Behavior normal.        Thought Content: Thought content normal.        Judgment: Judgment normal.        Assessment & Plan    1. Dry socket Will continue pain management with tramadol for severe pain. Use IBU in the meantime. Continue epsom salt gargles and oragel in the socket for pain. Advised to watch for signs of infection and call if they develop.  - traMADol (ULTRAM) 50 MG tablet; Take 1 tablet (50 mg total) by mouth every 6 (six) hours as needed.  Dispense: 90 tablet; Refill: 0    I discussed the assessment and treatment plan with the patient. The patient was provided an opportunity to ask questions and all were answered. The patient agreed with the plan and demonstrated an understanding of the instructions.   The patient was advised to call back or seek an in-person evaluation if the symptoms worsen or if the condition fails to improve as anticipated.  I provided 17 minutes of non-face-to-face time during this encounter. Mar Daring, PA-C  [facility]

## 2018-11-05 ENCOUNTER — Telehealth: Payer: Self-pay | Admitting: *Deleted

## 2018-11-05 DIAGNOSIS — Z87891 Personal history of nicotine dependence: Secondary | ICD-10-CM

## 2018-11-05 DIAGNOSIS — Z122 Encounter for screening for malignant neoplasm of respiratory organs: Secondary | ICD-10-CM

## 2018-11-05 DIAGNOSIS — R918 Other nonspecific abnormal finding of lung field: Secondary | ICD-10-CM

## 2018-11-05 NOTE — Telephone Encounter (Signed)
Contacted patient who is agreeable to scheduling her LCS nodule follow up scan that will be due soon.

## 2018-11-19 ENCOUNTER — Ambulatory Visit
Admission: RE | Admit: 2018-11-19 | Discharge: 2018-11-19 | Disposition: A | Discharge status: Home / Self Care | Payer: BLUE CROSS/BLUE SHIELD | Source: Ambulatory Visit | Attending: Oncology | Admitting: Oncology

## 2018-11-19 ENCOUNTER — Other Ambulatory Visit: Payer: Self-pay

## 2018-11-19 DIAGNOSIS — Z87891 Personal history of nicotine dependence: Secondary | ICD-10-CM | POA: Diagnosis not present

## 2018-11-19 DIAGNOSIS — R918 Other nonspecific abnormal finding of lung field: Secondary | ICD-10-CM | POA: Diagnosis not present

## 2018-11-19 DIAGNOSIS — Z122 Encounter for screening for malignant neoplasm of respiratory organs: Secondary | ICD-10-CM | POA: Diagnosis not present

## 2018-11-19 DIAGNOSIS — J439 Emphysema, unspecified: Secondary | ICD-10-CM | POA: Diagnosis not present

## 2018-11-21 ENCOUNTER — Other Ambulatory Visit: Payer: Self-pay | Admitting: Physician Assistant

## 2018-11-21 DIAGNOSIS — I10 Essential (primary) hypertension: Secondary | ICD-10-CM

## 2018-11-22 ENCOUNTER — Telehealth: Payer: Self-pay | Admitting: *Deleted

## 2018-11-22 ENCOUNTER — Encounter: Payer: Self-pay | Admitting: *Deleted

## 2018-11-22 NOTE — Telephone Encounter (Signed)
Notified patient of LDCT lung cancer screening program results with recommendation for 12 month follow up imaging. Also notified of incidental findings noted below and is encouraged to discuss further with PCP who will receive a copy of this note and/or the CT report. Patient verbalizes understanding.  IMPRESSION: 1. Lung-RADS 2, benign appearance or behavior. Continue annual screening with low-dose chest CT without contrast in 12 months. 2. Age advanced coronary artery atherosclerosis. Recommend assessment of coronary risk factors and consideration of medical therapy. 3. Aortic atherosclerosis (ICD10-I70.0) and emphysema (ICD10-J43.9).

## 2018-11-30 ENCOUNTER — Encounter: Payer: Self-pay | Admitting: Physician Assistant

## 2018-11-30 ENCOUNTER — Ambulatory Visit (INDEPENDENT_AMBULATORY_CARE_PROVIDER_SITE_OTHER): Payer: BLUE CROSS/BLUE SHIELD | Admitting: Physician Assistant

## 2018-11-30 DIAGNOSIS — J441 Chronic obstructive pulmonary disease with (acute) exacerbation: Secondary | ICD-10-CM | POA: Diagnosis not present

## 2018-11-30 DIAGNOSIS — B379 Candidiasis, unspecified: Secondary | ICD-10-CM | POA: Diagnosis not present

## 2018-11-30 DIAGNOSIS — T3695XA Adverse effect of unspecified systemic antibiotic, initial encounter: Secondary | ICD-10-CM | POA: Diagnosis not present

## 2018-11-30 MED ORDER — FLUCONAZOLE 150 MG PO TABS: 150.0000 mg | ORAL_TABLET | Freq: Once | ORAL | 0 refills | Status: AC

## 2018-11-30 MED ORDER — IPRATROPIUM-ALBUTEROL 0.5-2.5 (3) MG/3ML IN SOLN: 3.0000 mL | Freq: Four times a day (QID) | RESPIRATORY_TRACT | 11 refills | Status: DC | PRN

## 2018-11-30 MED ORDER — AMOXICILLIN-POT CLAVULANATE 875-125 MG PO TABS: 1.0000 | ORAL_TABLET | Freq: Two times a day (BID) | ORAL | 0 refills | Status: DC

## 2018-11-30 MED ORDER — BENZONATATE 200 MG PO CAPS: 200.0000 mg | ORAL_CAPSULE | Freq: Three times a day (TID) | ORAL | 0 refills | Status: DC | PRN

## 2018-11-30 NOTE — Patient Instructions (Signed)
Chronic Obstructive Pulmonary Disease Chronic obstructive pulmonary disease (COPD) is a long-term (chronic) lung problem. When you have COPD, it is hard for air to get in and out of your lungs. Usually the condition gets worse over time, and your lungs will never return to normal. There are things you can do to keep yourself as healthy as possible.  Your doctor may treat your condition with: ? Medicines. ? Oxygen. ? Lung surgery.  Your doctor may also recommend: ? Rehabilitation. This includes steps to make your body work better. It may involve a team of specialists. ? Quitting smoking, if you smoke. ? Exercise and changes to your diet. ? Comfort measures (palliative care). Follow these instructions at home: Medicines  Take over-the-counter and prescription medicines only as told by your doctor.  Talk to your doctor before taking any cough or allergy medicines. You may need to avoid medicines that cause your lungs to be dry. Lifestyle  If you smoke, stop. Smoking makes the problem worse. If you need help quitting, ask your doctor.  Avoid being around things that make your breathing worse. This may include smoke, chemicals, and fumes.  Stay active, but remember to rest as well.  Learn and use tips on how to relax.  Make sure you get enough sleep. Most adults need at least 7 hours of sleep every night.  Eat healthy foods. Eat smaller meals more often. Rest before meals. Controlled breathing Learn and use tips on how to control your breathing as told by your doctor. Try:  Breathing in (inhaling) through your nose for 1 second. Then, pucker your lips and breath out (exhale) through your lips for 2 seconds.  Putting one hand on your belly (abdomen). Breathe in slowly through your nose for 1 second. Your hand on your belly should move out. Pucker your lips and breathe out slowly through your lips. Your hand on your belly should move in as you breathe out.  Controlled coughing Learn  and use controlled coughing to clear mucus from your lungs. Follow these steps: 1. Lean your head a little forward. 2. Breathe in deeply. 3. Try to hold your breath for 3 seconds. 4. Keep your mouth slightly open while coughing 2 times. 5. Spit any mucus out into a tissue. 6. Rest and do the steps again 1 or 2 times as needed. General instructions  Make sure you get all the shots (vaccines) that your doctor recommends. Ask your doctor about a flu shot and a pneumonia shot.  Use oxygen therapy and pulmonary rehabilitation if told by your doctor. If you need home oxygen therapy, ask your doctor if you should buy a tool to measure your oxygen level (oximeter).  Make a COPD action plan with your doctor. This helps you to know what to do if you feel worse than usual.  Manage any other conditions you have as told by your doctor.  Avoid going outside when it is very hot, cold, or humid.  Avoid people who have a sickness you can catch (contagious).  Keep all follow-up visits as told by your doctor. This is important. Contact a doctor if:  You cough up more mucus than usual.  There is a change in the color or thickness of the mucus.  It is harder to breathe than usual.  Your breathing is faster than usual.  You have trouble sleeping.  You need to use your medicines more often than usual.  You have trouble doing your normal activities such as getting dressed   or walking around the house. Get help right away if:  You have shortness of breath while resting.  You have shortness of breath that stops you from: ? Being able to talk. ? Doing normal activities.  Your chest hurts for longer than 5 minutes.  Your skin color is more blue than usual.  Your pulse oximeter shows that you have low oxygen for longer than 5 minutes.  You have a fever.  You feel too tired to breathe normally. Summary  Chronic obstructive pulmonary disease (COPD) is a long-term lung problem.  The way your  lungs work will never return to normal. Usually the condition gets worse over time. There are things you can do to keep yourself as healthy as possible.  Take over-the-counter and prescription medicines only as told by your doctor.  If you smoke, stop. Smoking makes the problem worse. This information is not intended to replace advice given to you by your health care provider. Make sure you discuss any questions you have with your health care provider. Document Released: 01/04/2008 Document Revised: 08/22/2016 Document Reviewed: 08/22/2016 Elsevier Interactive Patient Education  2019 [employer]  

## 2018-11-30 NOTE — Progress Notes (Signed)
Virtual Visit via Video Note  I connected with Linda Bradford on 11/30/18 at 11:20 AM EDT by a video enabled telemedicine application and verified that I am speaking with the correct person using two identifiers.  Location: Patient: home Provider: [facility]   I discussed the limitations of evaluation and management by telemedicine and the availability of in person appointments. The patient expressed understanding and agreed to proceed.   Mar Daring, PA-C   Patient: Linda Bradford Female    DOB: 1955-09-13   63 y.o.   MRN: [id number] Visit Date: 11/30/2018  Today's Provider: Mar Daring, PA-C   No chief complaint on file.  Subjective:     HPI  Linda Bradford is a 63 yr old female with COPD, T2DM. She presents today via video visit through webex for worsening SOB, chest tightness, cough. Reports this feels similar to her previous bronchitis exacerbations. She has been compliant with her inhalers and nebulizer treatments. She has been sleeping with a humidifier. Denies fevers, chills, nausea, vomiting rash. No known covid exposure. Working from home.   Allergies  Allergen Reactions  . Dexilant [Dexlansoprazole] Nausea Only and Other (See Comments)    Dizziness     Current Outpatient Medications:  .  acetaminophen (TYLENOL) 500 MG tablet, Take 1,000 mg by mouth every 6 (six) hours as needed for mild pain or moderate pain. , Disp: , Rfl:  .  albuterol (PROVENTIL HFA;VENTOLIN HFA) 108 (90 Base) MCG/ACT inhaler, Inhale 1 puff into the lungs every 4 (four) hours as needed., Disp: 1 Inhaler, Rfl: 11 .  clonazePAM (KLONOPIN) 0.5 MG tablet, TAKE 1/2 TABLET BY MOUTH TWICE DAILY AS NEEDED, Disp: 180 tablet, Rfl: 1 .  Cyanocobalamin (RA VITAMIN B-12 TR) 1000 MCG TBCR, Take 1,000 mcg by mouth daily. , Disp: , Rfl:  .  diclofenac sodium (VOLTAREN) 1 % GEL, Apply 4 g topically 4 (four) times daily as needed., Disp: 100 g, Rfl: 1 .  DULoxetine (CYMBALTA) 60 MG  capsule, TAKE 1 CAPSULE BY MOUTH TWICE DAILY, Disp: 180 capsule, Rfl: 1 .  ergocalciferol (VITAMIN D2) 1.25 MG (50000 UT) capsule, 50,000 Units once a week. Once a week on Wednesday, Disp: , Rfl:  .  ezetimibe (ZETIA) 10 MG tablet, TAKE 1 TABLET BY MOUTH ONCE DAILY, Disp: 90 tablet, Rfl: 1 .  fluconazole (DIFLUCAN) 150 MG tablet, , Disp: , Rfl:  .  fluticasone (FLONASE) 50 MCG/ACT nasal spray, USE 2 SPRAYS IN EACH NOSTRIL ONCE DAILY, Disp: 48 g, Rfl: 1 .  fluticasone furoate-vilanterol (BREO ELLIPTA) 200-25 MCG/INH AEPB, Inhale 1 puff into the lungs daily., Disp: 60 each, Rfl: 5 .  gabapentin (NEURONTIN) 300 MG capsule, TAKE 3 CAPSULES BY MOUTH ONCE EVERY MORNING AND 4 CAPSULES AT BEDTIME, Disp: 450 capsule, Rfl: 1 .  levothyroxine (SYNTHROID, LEVOTHROID) 75 MCG tablet, TAKE 1 TABLET BY MOUTH ONCE DAILY ON AN EMPTY STOMACH. WAIT 30 MINUTES BEFORE TAKING OTHER MEDS., Disp: 90 tablet, Rfl: 1 .  losartan (COZAAR) 50 MG tablet, TAKE 1 TABLET BY MOUTH ONCE DAILY, Disp: 90 tablet, Rfl: 2 .  meloxicam (MOBIC) 15 MG tablet, TAKE 1 TABLET BY MOUTH ONCE DAILY, Disp: 90 tablet, Rfl: 1 .  metFORMIN (GLUCOPHAGE-XR) 500 MG 24 hr tablet, TAKE 2 TABLETS BY MOUTH ONCE DAILY WITH SUPPER, Disp: 180 tablet, Rfl: 1 .  ondansetron (ZOFRAN) 4 MG tablet, Take 1 tablet (4 mg total) by mouth every 8 (eight) hours as needed., Disp: 20 tablet, Rfl: 1 .  pyridOXINE (VITAMIN B-6) 100 MG tablet, Take 100 mg by mouth daily., Disp: , Rfl:  .  THEO-24 300 MG 24 hr capsule, TAKE 1 CAPSULE BY MOUTH ONCE DAILY, Disp: 90 capsule, Rfl: 1 .  tiZANidine (ZANAFLEX) 4 MG capsule, TAKE 1 CAPSULE BY MOUTH 3 TIMES DAILY (Patient taking differently: Take 4 mg by mouth at bedtime as needed for muscle spasms. ), Disp: 90 capsule, Rfl: 6 .  Vitamin D, Ergocalciferol, (DRISDOL) 50000 units CAPS capsule, TAKE 1 CAPSULE BY MOUTH EVERY 7 DAYS (Patient taking differently: Take 50,000 Units by mouth every Tuesday. ), Disp: 12 capsule, Rfl: 3 .  BD  POSIFLUSH 0.9 % SOLN injection, , Disp: , Rfl:  .  glucose blood (CONTOUR NEXT TEST) test strip, To check blood sugar once daily, Disp: 100 each, Rfl: 12 .  ipratropium-albuterol (DUONEB) 0.5-2.5 (3) MG/3ML SOLN, Take 3 mLs by nebulization every 6 (six) hours as needed., Disp: 360 mL, Rfl: 11 .  meclizine (ANTIVERT) 25 MG tablet, Take 1 tablet (25 mg total) by mouth 3 (three) times daily as needed for dizziness., Disp: 30 tablet, Rfl: 0 .  neomycin (MYCIFRADIN) 500 MG tablet, , Disp: , Rfl:  .  nicotine (NICODERM CQ - DOSED IN MG/24 HOURS) 21 mg/24hr patch, Place 1 patch (21 mg total) onto the skin daily. (Patient not taking: Reported on 11/30/2018), Disp: 56 patch, Rfl: 0 .  polyethylene glycol (MIRALAX / GLYCOLAX) packet, Take 17 g by mouth daily. (Patient not taking: Reported on 11/30/2018), Disp: 14 each, Rfl: 1 .  traMADol (ULTRAM) 50 MG tablet, Take 1 tablet (50 mg total) by mouth every 6 (six) hours as needed. (Patient not taking: Reported on 11/30/2018), Disp: 90 tablet, Rfl: 0  Review of Systems  Constitutional: Positive for fatigue. Negative for chills and fever.  HENT: Positive for congestion, postnasal drip and sinus pressure. Negative for ear discharge, ear pain, mouth sores, nosebleeds, rhinorrhea, sinus pain, sneezing and sore throat.   Respiratory: Positive for cough, chest tightness, shortness of breath and wheezing.   Cardiovascular: Negative for chest pain, palpitations and leg swelling.  Gastrointestinal: Negative for abdominal pain and nausea.  Neurological: Positive for headaches. Negative for dizziness.    Social History   Tobacco Use  . Smoking status: Current Every Day Smoker    Packs/day: 1.00    Years: 45.00    Pack years: 45.00    Types: Cigarettes    Start date: 03/02/1980  . Smokeless tobacco: Never Used  . Tobacco comment: now at 5 cigs qd!! d/t chantix and patch  Substance Use Topics  . Alcohol use: No    Alcohol/week: 0.0 standard drinks      Objective:    There were no vitals taken for this visit. There were no vitals filed for this visit.   Physical Exam Vitals signs reviewed.  Constitutional:      General: She is not in acute distress.    Appearance: Normal appearance. She is well-developed. She is obese. She is not ill-appearing.  HENT:     Head: Normocephalic and atraumatic.  Eyes:     General: No scleral icterus.    Extraocular Movements: Extraocular movements intact.  Neck:     Musculoskeletal: Normal range of motion and neck supple.  Pulmonary:     Effort: Pulmonary effort is normal. No respiratory distress.  Neurological:     Mental Status: She is alert.  Psychiatric:        Mood and Affect: Mood normal.  Behavior: Behavior normal.        Thought Content: Thought content normal.        Judgment: Judgment normal.         Assessment & Plan    1. Chronic obstructive pulmonary disease with acute exacerbation (HCC) Worsening symptoms that have not responded to OTC medications. Will give augmentin as below. Continue allergy medications and COPD medications. Duoneb refilled. Stay well hydrated and get plenty of rest. Call if no symptom improvement or if symptoms worsen. - ipratropium-albuterol (DUONEB) 0.5-2.5 (3) MG/3ML SOLN; Take 3 mLs by nebulization every 6 (six) hours as needed.  Dispense: 360 mL; Refill: 11 - amoxicillin-clavulanate (AUGMENTIN) 875-125 MG tablet; Take 1 tablet by mouth 2 (two) times daily.  Dispense: 20 tablet; Refill: 0 - benzonatate (TESSALON) 200 MG capsule; Take 1 capsule (200 mg total) by mouth 3 (three) times daily as needed.  Dispense: 30 capsule; Refill: 0  2. Antibiotic-induced yeast infection Gets yeast infections with antibiotics. Diflucan given as below.  - fluconazole (DIFLUCAN) 150 MG tablet; Take 1 tablet (150 mg total) by mouth once for 1 dose. May repeat in 48-72 hrs if needed  Dispense: 2 tablet; Refill: 0    I discussed the assessment and treatment plan with the patient. The  patient was provided an opportunity to ask questions and all were answered. The patient agreed with the plan and demonstrated an understanding of the instructions.   The patient was advised to call back or seek an in-person evaluation if the symptoms worsen or if the condition fails to improve as anticipated.  I provided 15 minutes of non-face-to-face time during this encounter.   Mar Daring, PA-C  [facility]

## 2018-12-04 ENCOUNTER — Other Ambulatory Visit: Payer: Self-pay | Admitting: Physician Assistant

## 2018-12-05 ENCOUNTER — Other Ambulatory Visit: Payer: Self-pay | Admitting: Physician Assistant

## 2018-12-05 DIAGNOSIS — I10 Essential (primary) hypertension: Secondary | ICD-10-CM

## 2018-12-05 DIAGNOSIS — M62838 Other muscle spasm: Secondary | ICD-10-CM

## 2018-12-05 DIAGNOSIS — E7800 Pure hypercholesterolemia, unspecified: Secondary | ICD-10-CM

## 2018-12-05 DIAGNOSIS — E039 Hypothyroidism, unspecified: Secondary | ICD-10-CM

## 2018-12-05 MED ORDER — TIZANIDINE HCL 4 MG PO TABS: 4.0000 mg | ORAL_TABLET | Freq: Every evening | ORAL | 1 refills | Status: DC | PRN

## 2018-12-05 MED ORDER — LOSARTAN POTASSIUM 50 MG PO TABS: 50.0000 mg | ORAL_TABLET | Freq: Every day | ORAL | 1 refills | Status: DC

## 2018-12-05 MED ORDER — LEVOTHYROXINE SODIUM 75 MCG PO TABS: ORAL_TABLET | ORAL | 1 refills | Status: DC

## 2018-12-05 MED ORDER — EZETIMIBE 10 MG PO TABS: 10.0000 mg | ORAL_TABLET | Freq: Every day | ORAL | 1 refills | Status: DC

## 2018-12-05 NOTE — Telephone Encounter (Signed)
[facility] faxed refill request for the following medications:  1. ezetimibe (ZETIA) 10 MG tablet 2. tiZANidine (ZANAFLEX) 4 MG tablet 3. losartan (COZAAR) 50 MG tablet 4. levothyroxine (SYNTHROID, LEVOTHROID) 75 MCG tablet  90 day supply   Please advise. Thanks TNP

## 2018-12-10 ENCOUNTER — Encounter: Payer: Self-pay | Admitting: Physician Assistant

## 2018-12-10 ENCOUNTER — Ambulatory Visit (INDEPENDENT_AMBULATORY_CARE_PROVIDER_SITE_OTHER): Payer: BLUE CROSS/BLUE SHIELD | Admitting: Physician Assistant

## 2018-12-10 DIAGNOSIS — Z20828 Contact with and (suspected) exposure to other viral communicable diseases: Secondary | ICD-10-CM

## 2018-12-10 DIAGNOSIS — Z20822 Contact with and (suspected) exposure to covid-19: Secondary | ICD-10-CM

## 2018-12-10 NOTE — Progress Notes (Signed)
Virtual Visit via Video Note  I connected with Linda Bradford on 12/10/18 at  2:20 PM EDT by a video enabled telemedicine application and verified that I am speaking with the correct person using two identifiers.  Location: Patient: Home Provider: [facility]   I discussed the limitations of evaluation and management by telemedicine and the availability of in person appointments. The patient expressed understanding and agreed to proceed.  Mar Daring, PA-C   Patient: Linda Bradford Female    DOB: 12-19-1955   63 y.o.   MRN: [id number] Visit Date: 12/10/2018  Today's Provider: Mar Daring, PA-C   No chief complaint on file.  Subjective:     HPI Linda Bradford is a 63 yr old female with multiple comorbidities including COPD and T2DM. She reports that she may have possibly been exposed to someone that was ill. She is unsure if she has Covid-19. She states it could have been the stomach flu, flu or even sinus infection. The person had no symptoms when she was around her. The patient has not exhibited any symptoms yet. It has been about 4-5 days since she was in contact with the person. The individual in concern lives in the same condo building as her (a neighbor).   Allergies  Allergen Reactions  . Dexilant [Dexlansoprazole] Nausea Only and Other (See Comments)    Dizziness     Current Outpatient Medications:  .  acetaminophen (TYLENOL) 500 MG tablet, Take 1,000 mg by mouth every 6 (six) hours as needed for mild pain or moderate pain. , Disp: , Rfl:  .  albuterol (PROVENTIL HFA;VENTOLIN HFA) 108 (90 Base) MCG/ACT inhaler, Inhale 1 puff into the lungs every 4 (four) hours as needed., Disp: 1 Inhaler, Rfl: 11 .  benzonatate (TESSALON) 200 MG capsule, Take 1 capsule (200 mg total) by mouth 3 (three) times daily as needed., Disp: 30 capsule, Rfl: 0 .  clonazePAM (KLONOPIN) 0.5 MG tablet, TAKE 1/2 TABLET BY MOUTH TWICE DAILY AS NEEDED, Disp: 180 tablet, Rfl: 1 .   Cyanocobalamin (RA VITAMIN B-12 TR) 1000 MCG TBCR, Take 1,000 mcg by mouth daily. , Disp: , Rfl:  .  diclofenac sodium (VOLTAREN) 1 % GEL, Apply 4 g topically 4 (four) times daily as needed., Disp: 100 g, Rfl: 1 .  DULoxetine (CYMBALTA) 60 MG capsule, TAKE 1 CAPSULE BY MOUTH TWICE DAILY, Disp: 180 capsule, Rfl: 1 .  ergocalciferol (VITAMIN D2) 1.25 MG (50000 UT) capsule, 50,000 Units once a week. Once a week on Wednesday, Disp: , Rfl:  .  ezetimibe (ZETIA) 10 MG tablet, Take 1 tablet (10 mg total) by mouth daily., Disp: 90 tablet, Rfl: 1 .  fluticasone (FLONASE) 50 MCG/ACT nasal spray, USE 2 SPRAYS IN EACH NOSTRIL ONCE DAILY, Disp: 48 g, Rfl: 1 .  fluticasone furoate-vilanterol (BREO ELLIPTA) 200-25 MCG/INH AEPB, Inhale 1 puff into the lungs daily., Disp: 60 each, Rfl: 5 .  gabapentin (NEURONTIN) 300 MG capsule, TAKE 3 CAPSULES BY MOUTH ONCE EVERY MORNING AND 4 CAPSULES AT BEDTIME, Disp: 450 capsule, Rfl: 1 .  glucose blood (CONTOUR NEXT TEST) test strip, To check blood sugar once daily, Disp: 100 each, Rfl: 12 .  ipratropium-albuterol (DUONEB) 0.5-2.5 (3) MG/3ML SOLN, Take 3 mLs by nebulization every 6 (six) hours as needed., Disp: 360 mL, Rfl: 11 .  levothyroxine (SYNTHROID) 75 MCG tablet, TAKE 1 TABLET BY MOUTH ONCE DAILY ON AN EMPTY STOMACH. WAIT 30 MINUTES BEFORE TAKING OTHER MEDS., Disp: 90 tablet, Rfl: 1 .  losartan (COZAAR) 50 MG tablet, Take 1 tablet (50 mg total) by mouth daily., Disp: 90 tablet, Rfl: 1 .  meclizine (ANTIVERT) 25 MG tablet, Take 1 tablet (25 mg total) by mouth 3 (three) times daily as needed for dizziness., Disp: 30 tablet, Rfl: 0 .  meloxicam (MOBIC) 15 MG tablet, TAKE 1 TABLET BY MOUTH ONCE DAILY, Disp: 90 tablet, Rfl: 1 .  metFORMIN (GLUCOPHAGE-XR) 500 MG 24 hr tablet, TAKE 2 TABLETS BY MOUTH ONCE DAILY WITH SUPPER, Disp: 180 tablet, Rfl: 1 .  ondansetron (ZOFRAN) 4 MG tablet, Take 1 tablet (4 mg total) by mouth every 8 (eight) hours as needed., Disp: 20 tablet, Rfl: 1 .   pyridOXINE (VITAMIN B-6) 100 MG tablet, Take 100 mg by mouth daily., Disp: , Rfl:  .  THEO-24 300 MG 24 hr capsule, TAKE 1 CAPSULE BY MOUTH ONCE DAILY, Disp: 90 capsule, Rfl: 1 .  tiZANidine (ZANAFLEX) 4 MG tablet, Take 1 tablet (4 mg total) by mouth at bedtime as needed for muscle spasms., Disp: 90 tablet, Rfl: 1 .  Vitamin D, Ergocalciferol, (DRISDOL) 50000 units CAPS capsule, TAKE 1 CAPSULE BY MOUTH EVERY 7 DAYS (Patient taking differently: Take 50,000 Units by mouth every Tuesday. ), Disp: 12 capsule, Rfl: 3 .  amoxicillin-clavulanate (AUGMENTIN) 875-125 MG tablet, Take 1 tablet by mouth 2 (two) times daily. (Patient not taking: Reported on 12/10/2018), Disp: 20 tablet, Rfl: 0 .  BD POSIFLUSH 0.9 % SOLN injection, , Disp: , Rfl:  .  neomycin (MYCIFRADIN) 500 MG tablet, , Disp: , Rfl:  .  nicotine (NICODERM CQ - DOSED IN MG/24 HOURS) 21 mg/24hr patch, Place 1 patch (21 mg total) onto the skin daily. (Patient not taking: Reported on 12/10/2018), Disp: 56 patch, Rfl: 0 .  polyethylene glycol (MIRALAX / GLYCOLAX) packet, Take 17 g by mouth daily. (Patient not taking: Reported on 12/10/2018), Disp: 14 each, Rfl: 1 .  traMADol (ULTRAM) 50 MG tablet, Take 1 tablet (50 mg total) by mouth every 6 (six) hours as needed. (Patient not taking: Reported on 12/10/2018), Disp: 90 tablet, Rfl: 0  Review of Systems  Constitutional: Negative for appetite change, chills, fatigue and fever.  Respiratory: Negative for chest tightness and shortness of breath.   Cardiovascular: Negative for chest pain and palpitations.  Gastrointestinal: Negative for abdominal pain, nausea and vomiting.  Neurological: Negative for dizziness and weakness.    Social History   Tobacco Use  . Smoking status: Current Every Day Smoker    Packs/day: 1.00    Years: 45.00    Pack years: 45.00    Types: Cigarettes    Start date: 03/02/1980  . Smokeless tobacco: Never Used  . Tobacco comment: now at 5 cigs qd!! d/t chantix and patch   Substance Use Topics  . Alcohol use: No    Alcohol/week: 0.0 standard drinks      Objective:   There were no vitals taken for this visit. There were no vitals filed for this visit.   Physical Exam Vitals signs reviewed.  Constitutional:      Appearance: Normal appearance. She is well-developed. She is not ill-appearing.  HENT:     Head: Normocephalic and atraumatic.  Neck:     Musculoskeletal: Normal range of motion and neck supple.  Pulmonary:     Effort: Pulmonary effort is normal. No respiratory distress.  Neurological:     Mental Status: She is alert.  Psychiatric:        Mood and Affect: Mood normal.  Behavior: Behavior normal.        Thought Content: Thought content normal.        Judgment: Judgment normal.         Assessment & Plan    1. Exposure to Covid-19 Virus Patient exposed to person that became ill after the meeting. She is unsure of what illness the patient has. Due to her not knowing and possibility of Covid exposure we will start the home monitoring for patient. She currently is asymptomatic and working from home so she is able to quarantine but still work. Call if symptoms develop.  - MyChart COVID-19 home monitoring program; Future - Temperature monitoring; Future    I discussed the assessment and treatment plan with the patient. The patient was provided an opportunity to ask questions and all were answered. The patient agreed with the plan and demonstrated an understanding of the instructions.   The patient was advised to call back or seek an in-person evaluation if the symptoms worsen or if the condition fails to improve as anticipated.  I provided 15 minutes of non-face-to-face time during this encounter.  Mar Daring, PA-C  [facility]

## 2018-12-10 NOTE — Patient Instructions (Signed)
This information is directly available on the CDC website: RunningShows.co.za.html    Source:CDC Reference to specific commercial products, manufacturers, companies, or trademarks does not constitute its endorsement or recommendation by the [place], [place], or Centers for [employer] and Prevention.    Person Under Monitoring Name: Linda Bradford  Location: [address] [place] [address]   Infection Prevention Recommendations for Individuals Confirmed to have, or Being Evaluated for, 2019 Novel Coronavirus (COVID-19) Infection Who Receive Care at Home  Individuals who are confirmed to have, or are being evaluated for, COVID-19 should follow the prevention steps below until a healthcare provider or local or state health department says they can return to normal activities.  Stay home except to get medical care You should restrict activities outside your home, except for getting medical care. Do not go to work, school, or public areas, and do not use public transportation or taxis.  Call ahead before visiting your doctor Before your medical appointment, call the healthcare provider and tell them that you have, or are being evaluated for, COVID-19 infection. This will help the healthcare provider's office take steps to keep other people from getting infected. Ask your healthcare provider to call the local or state health department.  Monitor your symptoms Seek prompt medical attention if your illness is worsening (e.g., difficulty breathing). Before going to your medical appointment, call the healthcare provider and tell them that you have, or are being evaluated for, COVID-19 infection. Ask your healthcare provider to call the local or state health department.  Wear a facemask You should wear a facemask that covers your nose and mouth when you are in the same room with  other people and when you visit a healthcare provider. People who live with or visit you should also wear a facemask while they are in the same room with you.  Separate yourself from other people in your home As much as possible, you should stay in a different room from other people in your home. Also, you should use a separate bathroom, if available.  Avoid sharing household items You should not share dishes, drinking glasses, cups, eating utensils, towels, bedding, or other items with other people in your home. After using these items, you should wash them thoroughly with soap and water.  Cover your coughs and sneezes Cover your mouth and nose with a tissue when you cough or sneeze, or you can cough or sneeze into your sleeve. Throw used tissues in a lined trash can, and immediately wash your hands with soap and water for at least 20 seconds or use an alcohol-based hand rub.  Wash your [employer] your hands often and thoroughly with soap and water for at least 20 seconds. You can use an alcohol-based hand sanitizer if soap and water are not available and if your hands are not visibly dirty. Avoid touching your eyes, nose, and mouth with unwashed hands.   Prevention Steps for Caregivers and Household Members of Individuals Confirmed to have, or Being Evaluated for, COVID-19 Infection Being Cared for in the Home  If you live with, or provide care at home for, a person confirmed to have, or being evaluated for, COVID-19 infection please follow these guidelines to prevent infection:  Follow healthcare provider's instructions Make sure that you understand and can help the patient follow any healthcare provider instructions for all care.  Provide for the patient's basic needs You should help the patient with basic needs in the  home and provide support for getting groceries, prescriptions, and other personal needs.  Monitor the patient's symptoms If they are getting sicker, call his  or her medical provider and tell them that the patient has, or is being evaluated for, COVID-19 infection. This will help the healthcare provider's office take steps to keep other people from getting infected. Ask the healthcare provider to call the local or state health department.  Limit the number of people who have contact with the patient  If possible, have only one caregiver for the patient.  Other household members should stay in another home or place of residence. If this is not possible, they should stay  in another room, or be separated from the patient as much as possible. Use a separate bathroom, if available.  Restrict visitors who do not have an essential need to be in the home.  Keep older adults, very young children, and other sick people away from the patient Keep older adults, very young children, and those who have compromised immune systems or chronic health conditions away from the patient. This includes people with chronic heart, lung, or kidney conditions, diabetes, and cancer.  Ensure good ventilation Make sure that shared spaces in the home have good air flow, such as from an air conditioner or an opened window, weather permitting.  Wash your hands often  Wash your hands often and thoroughly with soap and water for at least 20 seconds. You can use an alcohol based hand sanitizer if soap and water are not available and if your hands are not visibly dirty.  Avoid touching your eyes, nose, and mouth with unwashed hands.  Use disposable paper towels to dry your hands. If not available, use dedicated cloth towels and replace them when they become wet.  Wear a facemask and gloves  Wear a disposable facemask at all times in the room and gloves when you touch or have contact with the patient's blood, body fluids, and/or secretions or excretions, such as sweat, saliva, sputum, nasal mucus, vomit, urine, or feces.  Ensure the mask fits over your nose and mouth tightly,  and do not touch it during use.  Throw out disposable facemasks and gloves after using them. Do not reuse.  Wash your hands immediately after removing your facemask and gloves.  If your personal clothing becomes contaminated, carefully remove clothing and launder. Wash your hands after handling contaminated clothing.  Place all used disposable facemasks, gloves, and other waste in a lined container before disposing them with other household waste.  Remove gloves and wash your hands immediately after handling these items.  Do not share dishes, glasses, or other household items with the patient  Avoid sharing household items. You should not share dishes, drinking glasses, cups, eating utensils, towels, bedding, or other items with a patient who is confirmed to have, or being evaluated for, COVID-19 infection.  After the person uses these items, you should wash them thoroughly with soap and water.  Wash laundry thoroughly  Immediately remove and wash clothes or bedding that have blood, body fluids, and/or secretions or excretions, such as sweat, saliva, sputum, nasal mucus, vomit, urine, or feces, on them.  Wear gloves when handling laundry from the patient.  Read and follow directions on labels of laundry or clothing items and detergent. In general, wash and dry with the warmest temperatures recommended on the label.  Clean all areas the individual has used often  Clean all touchable surfaces, such as counters, tabletops, doorknobs, bathroom fixtures, toilets,  phones, keyboards, tablets, and bedside tables, every day. Also, clean any surfaces that may have blood, body fluids, and/or secretions or excretions on them.  Wear gloves when cleaning surfaces the patient has come in contact with.  Use a diluted bleach solution (e.g., dilute bleach with 1 part bleach and 10 parts water) or a household disinfectant with a label that says EPA-registered for coronaviruses. To make a bleach solution  at home, add 1 tablespoon of bleach to 1 quart (4 cups) of water. For a larger supply, add  cup of bleach to 1 gallon (16 cups) of water.  Read labels of cleaning products and follow recommendations provided on product labels. Labels contain instructions for safe and effective use of the cleaning product including precautions you should take when applying the product, such as wearing gloves or eye protection and making sure you have good ventilation during use of the product.  Remove gloves and wash hands immediately after cleaning.  Monitor yourself for signs and symptoms of illness Caregivers and household members are considered close contacts, should monitor their health, and will be asked to limit movement outside of the home to the extent possible. Follow the monitoring steps for close contacts listed on the symptom monitoring form.   ? If you have additional questions, contact your local health department or call the epidemiologist on call at [phone] (available 24/7). ? This guidance is subject to change. For the most up-to-date guidance from [facility], please refer to their website: [email or web address].pl

## 2018-12-20 ENCOUNTER — Other Ambulatory Visit: Payer: Self-pay | Admitting: Physician Assistant

## 2018-12-20 DIAGNOSIS — J449 Chronic obstructive pulmonary disease, unspecified: Secondary | ICD-10-CM

## 2019-01-02 ENCOUNTER — Other Ambulatory Visit: Payer: Self-pay | Admitting: Physician Assistant

## 2019-01-02 DIAGNOSIS — E7800 Pure hypercholesterolemia, unspecified: Secondary | ICD-10-CM

## 2019-01-18 ENCOUNTER — Other Ambulatory Visit: Payer: Self-pay

## 2019-01-18 DIAGNOSIS — Z1211 Encounter for screening for malignant neoplasm of colon: Secondary | ICD-10-CM

## 2019-01-21 ENCOUNTER — Telehealth (INDEPENDENT_AMBULATORY_CARE_PROVIDER_SITE_OTHER): Payer: BLUE CROSS/BLUE SHIELD | Admitting: Physician Assistant

## 2019-01-21 DIAGNOSIS — N3000 Acute cystitis without hematuria: Secondary | ICD-10-CM

## 2019-01-21 DIAGNOSIS — R300 Dysuria: Secondary | ICD-10-CM

## 2019-01-21 LAB — POCT URINALYSIS DIPSTICK
Bilirubin, UA: NEGATIVE
Glucose, UA: NEGATIVE
Ketones, UA: NEGATIVE
Nitrite, UA: NEGATIVE
Odor: POSITIVE
Protein, UA: NEGATIVE
Spec Grav, UA: 1.015 (ref 1.010–1.025)
Urobilinogen, UA: 0.2 E.U./dL
pH, UA: 6.5 (ref 5.0–8.0)

## 2019-01-21 MED ORDER — CEPHALEXIN 500 MG PO CAPS: 500.0000 mg | ORAL_CAPSULE | Freq: Two times a day (BID) | ORAL | 0 refills | Status: DC

## 2019-01-21 NOTE — Telephone Encounter (Signed)
UA seems to show UTI. Sent in cephalexin to [facility]. I will adjust treatment if needed once we receive culture results.

## 2019-01-21 NOTE — Addendum Note (Signed)
Addended by: Mar Daring on: 01/21/2019 01:50 PM   Modules accepted: Orders

## 2019-01-21 NOTE — Telephone Encounter (Signed)
Patient advised as below. Patient verbalizes understanding and is in agreement with treatment plan.  

## 2019-01-21 NOTE — Telephone Encounter (Signed)
Patient called in wanting an appt for a UTI.  She is having frequency, burning upon urination, back pain and abdominal pain. I offered her an appt. With another provider because you are slammed and she didn't want to do that. She wanted something called in.    Please advise.

## 2019-01-21 NOTE — Telephone Encounter (Signed)
She has not had a UTI in a while. Can she bring [place] a urine specimen and drop off for [place] to test for her and send for culture.

## 2019-01-21 NOTE — Addendum Note (Signed)
Addended by: Doristine Devoid on: 01/21/2019 01:45 PM   Modules accepted: Orders

## 2019-01-22 DIAGNOSIS — M65341 Trigger finger, right ring finger: Secondary | ICD-10-CM | POA: Diagnosis not present

## 2019-01-23 ENCOUNTER — Telehealth: Payer: Self-pay | Admitting: *Deleted

## 2019-01-23 LAB — URINE CULTURE

## 2019-01-23 NOTE — Telephone Encounter (Signed)
-----   Message from Mar Daring, [place] sent at 01/23/2019  1:19 PM EDT ----- Urine culture was positive for klebsiella bacteria. It is sensitive to the antibiotic you were placed on. Continue until completed and call the office if symptoms do not completely resolve.

## 2019-01-23 NOTE — Telephone Encounter (Signed)
Patient advised.

## 2019-01-23 NOTE — Telephone Encounter (Signed)
LMOVM for pt to return call 

## 2019-01-28 ENCOUNTER — Other Ambulatory Visit: Payer: Self-pay | Admitting: Physician Assistant

## 2019-01-28 DIAGNOSIS — G629 Polyneuropathy, unspecified: Secondary | ICD-10-CM

## 2019-01-30 ENCOUNTER — Telehealth: Payer: Self-pay

## 2019-01-30 DIAGNOSIS — N3000 Acute cystitis without hematuria: Secondary | ICD-10-CM

## 2019-01-30 MED ORDER — SULFAMETHOXAZOLE-TRIMETHOPRIM 800-160 MG PO TABS: 1.0000 | ORAL_TABLET | Freq: Two times a day (BID) | ORAL | 0 refills | Status: DC

## 2019-01-30 NOTE — Telephone Encounter (Signed)
Patient was advised.  

## 2019-01-30 NOTE — Telephone Encounter (Signed)
Sent in Bactrim.

## 2019-01-30 NOTE — Telephone Encounter (Signed)
Please advise 

## 2019-01-30 NOTE — Telephone Encounter (Signed)
Patient was seen last week and treated for UTI.  Got better while on abx.  Then 1 day after finishing them the symptoms came back. Patient would like to know what she should do now.  [facility]  CB# [phone]

## 2019-02-05 ENCOUNTER — Telehealth: Payer: Self-pay | Admitting: Gastroenterology

## 2019-02-05 NOTE — Telephone Encounter (Signed)
LVM returning patients call to schedule colonoscopy.  Thanks [employer]

## 2019-02-05 NOTE — Telephone Encounter (Signed)
Patient returning call to [facility] to schedule a colonoscopy. She is in the referral work Q

## 2019-02-06 ENCOUNTER — Other Ambulatory Visit: Payer: Self-pay

## 2019-02-06 ENCOUNTER — Telehealth: Payer: Self-pay

## 2019-02-06 DIAGNOSIS — Z1211 Encounter for screening for malignant neoplasm of colon: Secondary | ICD-10-CM

## 2019-02-06 NOTE — Telephone Encounter (Signed)
Gastroenterology Pre-Procedure Review  Request Date: 03/01/19 Requesting Physician: Dr. Bonna Gains  PATIENT REVIEW QUESTIONS: The patient responded to the following health history questions as indicated:    1. Are you having any GI issues? no 2. Do you have a personal history of Polyps? no 3. Do you have a family history of Colon Cancer or Polyps? yes (aunt colon cancer) 4. Diabetes Mellitus? yes (oral meds) 5. Joint replacements in the past 12 months?no 6. Major health problems in the past 3 months?no 7. Any artificial heart valves, MVP, or defibrillator?no    MEDICATIONS & ALLERGIES:    Patient reports the following regarding taking any anticoagulation/antiplatelet therapy:   Plavix, Coumadin, Eliquis, Xarelto, Lovenox, Pradaxa, Brilinta, or Effient? no Aspirin? no  Patient confirms/reports the following medications:  Current Outpatient Medications  Medication Sig Dispense Refill  . acetaminophen (TYLENOL) 500 MG tablet Take 1,000 mg by mouth every 6 (six) hours as needed for mild pain or moderate pain.     Marland Kitchen albuterol (PROVENTIL HFA;VENTOLIN HFA) 108 (90 Base) MCG/ACT inhaler Inhale 1 puff into the lungs every 4 (four) hours as needed. 1 Inhaler 11  . BD POSIFLUSH 0.9 % SOLN injection     . benzonatate (TESSALON) 200 MG capsule Take 1 capsule (200 mg total) by mouth 3 (three) times daily as needed. 30 capsule 0  . clonazePAM (KLONOPIN) 0.5 MG tablet TAKE 1/2 TABLET BY MOUTH TWICE DAILY AS NEEDED 180 tablet 1  . Cyanocobalamin (RA VITAMIN B-12 TR) 1000 MCG TBCR Take 1,000 mcg by mouth daily.     . diclofenac sodium (VOLTAREN) 1 % GEL Apply 4 g topically 4 (four) times daily as needed. 100 g 1  . DULoxetine (CYMBALTA) 60 MG capsule TAKE 1 CAPSULE BY MOUTH TWICE DAILY 180 capsule 1  . ergocalciferol (VITAMIN D2) 1.25 MG (50000 UT) capsule 50,000 Units once a week. Once a week on Wednesday    . ezetimibe (ZETIA) 10 MG tablet TAKE 1 TABLET BY MOUTH ONCE DAILY 90 tablet 1  . fluticasone  (FLONASE) 50 MCG/ACT nasal spray USE 2 SPRAYS IN EACH NOSTRIL ONCE DAILY 48 g 1  . fluticasone furoate-vilanterol (BREO ELLIPTA) 200-25 MCG/INH AEPB Inhale 1 puff into the lungs daily. 60 each 5  . gabapentin (NEURONTIN) 300 MG capsule TAKE 3 CAPSULES BY MOUTH ONCE EVERY MORNING AND 4 CAPSULES AT BEDTIME 450 capsule 1  . glucose blood (CONTOUR NEXT TEST) test strip To check blood sugar once daily 100 each 12  . ipratropium-albuterol (DUONEB) 0.5-2.5 (3) MG/3ML SOLN Take 3 mLs by nebulization every 6 (six) hours as needed. 360 mL 11  . levothyroxine (SYNTHROID) 75 MCG tablet TAKE 1 TABLET BY MOUTH ONCE DAILY ON AN EMPTY STOMACH. WAIT 30 MINUTES BEFORE TAKING OTHER MEDS. 90 tablet 1  . losartan (COZAAR) 50 MG tablet Take 1 tablet (50 mg total) by mouth daily. 90 tablet 1  . meclizine (ANTIVERT) 25 MG tablet Take 1 tablet (25 mg total) by mouth 3 (three) times daily as needed for dizziness. 30 tablet 0  . meloxicam (MOBIC) 15 MG tablet TAKE 1 TABLET BY MOUTH ONCE DAILY 90 tablet 1  . metFORMIN (GLUCOPHAGE-XR) 500 MG 24 hr tablet TAKE 2 TABLETS BY MOUTH ONCE DAILY WITH SUPPER 180 tablet 1  . neomycin (MYCIFRADIN) 500 MG tablet     . nicotine (NICODERM CQ - DOSED IN MG/24 HOURS) 21 mg/24hr patch Place 1 patch (21 mg total) onto the skin daily. (Patient not taking: Reported on 12/10/2018) 56 patch 0  .  ondansetron (ZOFRAN) 4 MG tablet Take 1 tablet (4 mg total) by mouth every 8 (eight) hours as needed. 20 tablet 1  . polyethylene glycol (MIRALAX / GLYCOLAX) packet Take 17 g by mouth daily. (Patient not taking: Reported on 12/10/2018) 14 each 1  . pyridOXINE (VITAMIN B-6) 100 MG tablet Take 100 mg by mouth daily.    Marland Kitchen sulfamethoxazole-trimethoprim (BACTRIM DS) 800-160 MG tablet Take 1 tablet by mouth 2 (two) times daily. 14 tablet 0  . THEO-24 300 MG 24 hr capsule TAKE 1 CAPSULE BY MOUTH ONCE DAILY 90 capsule 1  . tiZANidine (ZANAFLEX) 4 MG tablet Take 1 tablet (4 mg total) by mouth at bedtime as needed for  muscle spasms. 90 tablet 1  . traMADol (ULTRAM) 50 MG tablet Take 1 tablet (50 mg total) by mouth every 6 (six) hours as needed. (Patient not taking: Reported on 12/10/2018) 90 tablet 0  . Vitamin D, Ergocalciferol, (DRISDOL) 50000 units CAPS capsule TAKE 1 CAPSULE BY MOUTH EVERY 7 DAYS (Patient taking differently: Take 50,000 Units by mouth every Tuesday. ) 12 capsule 3   No current facility-administered medications for this visit.     Patient confirms/reports the following allergies:  Allergies  Allergen Reactions  . Dexilant [Dexlansoprazole] Nausea Only and Other (See Comments)    Dizziness    No orders of the defined types were placed in this encounter.   AUTHORIZATION INFORMATION Primary Insurance: 1D#: Group #:  Secondary Insurance: 1D#: Group #:  SCHEDULE INFORMATION: Date: 03/01/19 Time: Location:[facility]

## 2019-02-07 ENCOUNTER — Telehealth: Payer: Self-pay

## 2019-02-07 NOTE — Telephone Encounter (Signed)
LVM with patient to call me back.  We just talked and rescheduled her colonoscopy to 02/22/19 with Dr. Bonna Gains, however Dr. Bonna Gains is full on this day.  We have no Friday's available in July other than the original 07/31 date.  We do have Wednesday's available.  Thanks [employer]

## 2019-02-08 ENCOUNTER — Other Ambulatory Visit: Payer: Self-pay

## 2019-02-08 ENCOUNTER — Ambulatory Visit (INDEPENDENT_AMBULATORY_CARE_PROVIDER_SITE_OTHER): Payer: BC Managed Care – PPO | Admitting: Physician Assistant

## 2019-02-08 ENCOUNTER — Encounter: Payer: Self-pay | Admitting: Physician Assistant

## 2019-02-08 VITALS — BP 122/73 | HR 92 | Temp 98.1°F | Resp 16 | Ht 69.0 in | Wt 216.0 lb

## 2019-02-08 DIAGNOSIS — N3000 Acute cystitis without hematuria: Secondary | ICD-10-CM | POA: Diagnosis not present

## 2019-02-08 DIAGNOSIS — R350 Frequency of micturition: Secondary | ICD-10-CM

## 2019-02-08 LAB — POCT URINALYSIS DIPSTICK
Appearance: NORMAL
Bilirubin, UA: NEGATIVE
Blood, UA: NEGATIVE
Glucose, UA: NEGATIVE
Nitrite, UA: NEGATIVE
Odor: NORMAL
Protein, UA: NEGATIVE
Spec Grav, UA: 1.01 (ref 1.010–1.025)
Urobilinogen, UA: 0.2 E.U./dL
pH, UA: 6 (ref 5.0–8.0)

## 2019-02-08 MED ORDER — NITROFURANTOIN MONOHYD MACRO 100 MG PO CAPS: 100.0000 mg | ORAL_CAPSULE | Freq: Two times a day (BID) | ORAL | 0 refills | Status: DC

## 2019-02-08 NOTE — Progress Notes (Signed)
N Patient: Linda Bradford Female    DOB: 1956-04-26   63 y.o.   MRN: [id number] Visit Date: 02/08/2019  Today's Provider: Mar Daring, PA-C   No chief complaint on file.  Subjective:     HPI   Patient has been treated twice for UTI symptoms since 01/21/2019 (culture grew out klebsiella and has been treated with keflex and bactrim, respectively). However she states symptoms have not cleared up. Patient is still having pelvic pressure, low back pain, and frequency.   Allergies  Allergen Reactions  . Dexilant [Dexlansoprazole] Nausea Only and Other (See Comments)    Dizziness     Current Outpatient Medications:  .  acetaminophen (TYLENOL) 500 MG tablet, Take 1,000 mg by mouth every 6 (six) hours as needed for mild pain or moderate pain. , Disp: , Rfl:  .  albuterol (PROVENTIL HFA;VENTOLIN HFA) 108 (90 Base) MCG/ACT inhaler, Inhale 1 puff into the lungs every 4 (four) hours as needed., Disp: 1 Inhaler, Rfl: 11 .  BD POSIFLUSH 0.9 % SOLN injection, , Disp: , Rfl:  .  benzonatate (TESSALON) 200 MG capsule, Take 1 capsule (200 mg total) by mouth 3 (three) times daily as needed., Disp: 30 capsule, Rfl: 0 .  clonazePAM (KLONOPIN) 0.5 MG tablet, TAKE 1/2 TABLET BY MOUTH TWICE DAILY AS NEEDED, Disp: 180 tablet, Rfl: 1 .  Cyanocobalamin (RA VITAMIN B-12 TR) 1000 MCG TBCR, Take 1,000 mcg by mouth daily. , Disp: , Rfl:  .  diclofenac sodium (VOLTAREN) 1 % GEL, Apply 4 g topically 4 (four) times daily as needed., Disp: 100 g, Rfl: 1 .  DULoxetine (CYMBALTA) 60 MG capsule, TAKE 1 CAPSULE BY MOUTH TWICE DAILY, Disp: 180 capsule, Rfl: 1 .  ergocalciferol (VITAMIN D2) 1.25 MG (50000 UT) capsule, 50,000 Units once a week. Once a week on Wednesday, Disp: , Rfl:  .  ezetimibe (ZETIA) 10 MG tablet, TAKE 1 TABLET BY MOUTH ONCE DAILY, Disp: 90 tablet, Rfl: 1 .  fluticasone (FLONASE) 50 MCG/ACT nasal spray, USE 2 SPRAYS IN EACH NOSTRIL ONCE DAILY, Disp: 48 g, Rfl: 1 .  fluticasone furoate-vilanterol (BREO  ELLIPTA) 200-25 MCG/INH AEPB, Inhale 1 puff into the lungs daily., Disp: 60 each, Rfl: 5 .  gabapentin (NEURONTIN) 300 MG capsule, TAKE 3 CAPSULES BY MOUTH ONCE EVERY MORNING AND 4 CAPSULES AT BEDTIME, Disp: 450 capsule, Rfl: 1 .  glucose blood (CONTOUR NEXT TEST) test strip, To check blood sugar once daily, Disp: 100 each, Rfl: 12 .  ipratropium-albuterol (DUONEB) 0.5-2.5 (3) MG/3ML SOLN, Take 3 mLs by nebulization every 6 (six) hours as needed., Disp: 360 mL, Rfl: 11 .  levothyroxine (SYNTHROID) 75 MCG tablet, TAKE 1 TABLET BY MOUTH ONCE DAILY ON AN EMPTY STOMACH. WAIT 30 MINUTES BEFORE TAKING OTHER MEDS., Disp: 90 tablet, Rfl: 1 .  losartan (COZAAR) 50 MG tablet, Take 1 tablet (50 mg total) by mouth daily., Disp: 90 tablet, Rfl: 1 .  meclizine (ANTIVERT) 25 MG tablet, Take 1 tablet (25 mg total) by mouth 3 (three) times daily as needed for dizziness., Disp: 30 tablet, Rfl: 0 .  meloxicam (MOBIC) 15 MG tablet, TAKE 1 TABLET BY MOUTH ONCE DAILY, Disp: 90 tablet, Rfl: 1 .  metFORMIN (GLUCOPHAGE-XR) 500 MG 24 hr tablet, TAKE 2 TABLETS BY MOUTH ONCE DAILY WITH SUPPER, Disp: 180 tablet, Rfl: 1 .  neomycin (MYCIFRADIN) 500 MG tablet, , Disp: , Rfl:  .  nicotine (NICODERM CQ - DOSED IN MG/24 HOURS) 21 mg/24hr patch, Place 1 patch (21 mg  total) onto the skin daily. (Patient not taking: Reported on 12/10/2018), Disp: 56 patch, Rfl: 0 .  ondansetron (ZOFRAN) 4 MG tablet, Take 1 tablet (4 mg total) by mouth every 8 (eight) hours as needed., Disp: 20 tablet, Rfl: 1 .  polyethylene glycol (MIRALAX / GLYCOLAX) packet, Take 17 g by mouth daily. (Patient not taking: Reported on 12/10/2018), Disp: 14 each, Rfl: 1 .  pyridOXINE (VITAMIN B-6) 100 MG tablet, Take 100 mg by mouth daily., Disp: , Rfl:  .  sulfamethoxazole-trimethoprim (BACTRIM DS) 800-160 MG tablet, Take 1 tablet by mouth 2 (two) times daily., Disp: 14 tablet, Rfl: 0 .  THEO-24 300 MG 24 hr capsule, TAKE 1 CAPSULE BY MOUTH ONCE DAILY, Disp: 90 capsule,  Rfl: 1 .  tiZANidine (ZANAFLEX) 4 MG tablet, Take 1 tablet (4 mg total) by mouth at bedtime as needed for muscle spasms., Disp: 90 tablet, Rfl: 1 .  traMADol (ULTRAM) 50 MG tablet, Take 1 tablet (50 mg total) by mouth every 6 (six) hours as needed. (Patient not taking: Reported on 12/10/2018), Disp: 90 tablet, Rfl: 0 .  Vitamin D, Ergocalciferol, (DRISDOL) 50000 units CAPS capsule, TAKE 1 CAPSULE BY MOUTH EVERY 7 DAYS (Patient taking differently: Take 50,000 Units by mouth every Tuesday. ), Disp: 12 capsule, Rfl: 3  Review of Systems  Constitutional: Negative for appetite change, chills, fatigue and fever.  Respiratory: Negative for chest tightness and shortness of breath.   Cardiovascular: Negative for chest pain and palpitations.  Gastrointestinal: Negative for abdominal pain, nausea and vomiting.  Genitourinary: Positive for dysuria and urgency. Negative for flank pain and hematuria.  Neurological: Negative for dizziness and weakness.    Social History   Tobacco Use  . Smoking status: Current Every Day Smoker    Packs/day: 1.00    Years: 45.00    Pack years: 45.00    Types: Cigarettes    Start date: 03/02/1980  . Smokeless tobacco: Never Used  . Tobacco comment: now at 5 cigs qd!! d/t chantix and patch  Substance Use Topics  . Alcohol use: No    Alcohol/week: 0.0 standard drinks      Objective:   There were no vitals taken for this visit. There were no vitals filed for this visit.   Physical Exam Vitals signs reviewed.  Constitutional:      General: She is not in acute distress.    Appearance: Normal appearance. She is well-developed. She is not ill-appearing or diaphoretic.  Cardiovascular:     Rate and Rhythm: Normal rate and regular rhythm.     Heart sounds: Normal heart sounds. No murmur. No friction rub. No gallop.   Pulmonary:     Effort: Pulmonary effort is normal. No respiratory distress.     Breath sounds: Normal breath sounds. No wheezing or rales.  Abdominal:      General: Bowel sounds are normal. There is no distension.     Palpations: Abdomen is soft. There is no mass.     Tenderness: There is no abdominal tenderness. There is no guarding or rebound.  Skin:    General: Skin is warm and dry.  Neurological:     Mental Status: She is alert and oriented to person, place, and time.     No results found for any visits on 02/08/19.     Assessment & Plan    1. Frequency of urination UA still seems to show infection. Will send for repeat culture and f/u pending results. Macrobid given as below. Will adjust  therapy prn. Call if worsening.  - POCT urinalysis dipstick - CULTURE, URINE COMPREHENSIVE  2. Acute cystitis without hematuria See above medical treatment plan. - nitrofurantoin, macrocrystal-monohydrate, (MACROBID) 100 MG capsule; Take 1 capsule (100 mg total) by mouth 2 (two) times daily.  Dispense: 28 capsule; Refill: 0     Mar Daring, PA-C  [facility]

## 2019-02-08 NOTE — Patient Instructions (Signed)
Urinary Tract Infection, Adult A urinary tract infection (UTI) is an infection of any part of the urinary tract. The urinary tract includes:  The kidneys.  The ureters.  The bladder.  The urethra. These organs make, store, and get rid of pee (urine) in the body. What are the causes? This is caused by germs (bacteria) in your genital area. These germs grow and cause swelling (inflammation) of your urinary tract. What increases the risk? You are more likely to develop this condition if:  You have a small, thin tube (catheter) to drain pee.  You cannot control when you pee or poop (incontinence).  You are female, and: ? You use these methods to prevent pregnancy: ? A medicine that kills sperm (spermicide). ? A device that blocks sperm (diaphragm). ? You have low levels of a female hormone (estrogen). ? You are pregnant.  You have genes that add to your risk.  You are sexually active.  You take antibiotic medicines.  You have trouble peeing because of: ? A prostate that is bigger than normal, if you are female. ? A blockage in the part of your body that drains pee from the bladder (urethra). ? A kidney stone. ? A nerve condition that affects your bladder (neurogenic bladder). ? Not getting enough to drink. ? Not peeing often enough.  You have other conditions, such as: ? Diabetes. ? A weak disease-fighting system (immune system). ? Sickle cell disease. ? Gout. ? Injury of the spine. What are the signs or symptoms? Symptoms of this condition include:  Needing to pee right away (urgently).  Peeing often.  Peeing small amounts often.  Pain or burning when peeing.  Blood in the pee.  Pee that smells bad or not like normal.  Trouble peeing.  Pee that is cloudy.  Fluid coming from the vagina, if you are female.  Pain in the belly or lower back. Other symptoms include:  Throwing up (vomiting).  No urge to eat.  Feeling mixed up (confused).  Being tired  and grouchy (irritable).  A fever.  Watery poop (diarrhea). How is this treated? This condition may be treated with:  Antibiotic medicine.  Other medicines.  Drinking enough water. Follow these instructions at home:  Medicines  Take over-the-counter and prescription medicines only as told by your doctor.  If you were prescribed an antibiotic medicine, take it as told by your doctor. Do not stop taking it even if you start to feel better. General instructions  Make sure you: ? Pee until your bladder is empty. ? Do not hold pee for a long time. ? Empty your bladder after sex. ? Wipe from front to back after pooping if you are a female. Use each tissue one time when you wipe.  Drink enough fluid to keep your pee pale yellow.  Keep all follow-up visits as told by your doctor. This is important. Contact a doctor if:  You do not get better after 1-2 days.  Your symptoms go away and then come back. Get help right away if:  You have very bad back pain.  You have very bad pain in your lower belly.  You have a fever.  You are sick to your stomach (nauseous).  You are throwing up. Summary  A urinary tract infection (UTI) is an infection of any part of the urinary tract.  This condition is caused by germs in your genital area.  There are many risk factors for a UTI. These include having a small, thin   tube to drain pee and not being able to control when you pee or poop.  Treatment includes antibiotic medicines for germs.  Drink enough fluid to keep your pee pale yellow. This information is not intended to replace advice given to you by your health care provider. Make sure you discuss any questions you have with your health care provider. Document Released: 01/04/2008 Document Revised: 07/05/2018 Document Reviewed: 01/25/2018 Elsevier Patient Education  2020 [employer]  

## 2019-02-11 ENCOUNTER — Telehealth: Payer: Self-pay

## 2019-02-11 ENCOUNTER — Other Ambulatory Visit: Payer: Self-pay

## 2019-02-11 DIAGNOSIS — B379 Candidiasis, unspecified: Secondary | ICD-10-CM

## 2019-02-11 DIAGNOSIS — T3695XA Adverse effect of unspecified systemic antibiotic, initial encounter: Secondary | ICD-10-CM

## 2019-02-11 LAB — CULTURE, URINE COMPREHENSIVE

## 2019-02-11 MED ORDER — FLUCONAZOLE 150 MG PO TABS: 150.0000 mg | ORAL_TABLET | Freq: Once | ORAL | 0 refills | Status: AC

## 2019-02-11 NOTE — Telephone Encounter (Signed)
Patient advised as directed. We got this connected. If patient calls back please advise

## 2019-02-11 NOTE — Telephone Encounter (Signed)
Pt requesting medication for yeast infection due to all the ABX she has been taking. Call into [facility].

## 2019-02-11 NOTE — Telephone Encounter (Signed)
Patient said Linda Bradford.

## 2019-02-11 NOTE — Telephone Encounter (Signed)
I think she uses [facility]? Can we verify pharmacy as [facility]?  Thanks

## 2019-02-11 NOTE — Telephone Encounter (Signed)
-----   Message from Mar Daring, PA-C sent at 02/11/2019  9:31 AM EDT ----- The culture this time was positive for ecoli. It was resistant to Bactrim, which was the second antibiotic I used and explains why it did not get better. It is sensitive to the macrobid I have put you on this time. Take until completed.

## 2019-02-26 ENCOUNTER — Other Ambulatory Visit: Payer: Self-pay

## 2019-02-26 ENCOUNTER — Encounter
Admission: RE | Admit: 2019-02-26 | Discharge: 2019-02-26 | Disposition: A | Discharge status: Home / Self Care | Payer: BC Managed Care – PPO | Source: Ambulatory Visit | Attending: Gastroenterology | Admitting: Gastroenterology

## 2019-02-26 MED ORDER — NA SULFATE-K SULFATE-MG SULF 17.5-3.13-1.6 GM/177ML PO SOLN: 1.0000 | Freq: Once | ORAL | 0 refills | Status: AC

## 2019-02-28 ENCOUNTER — Other Ambulatory Visit: Payer: Self-pay | Admitting: Physician Assistant

## 2019-02-28 DIAGNOSIS — E119 Type 2 diabetes mellitus without complications: Secondary | ICD-10-CM

## 2019-03-05 ENCOUNTER — Other Ambulatory Visit: Payer: Self-pay

## 2019-03-05 ENCOUNTER — Other Ambulatory Visit
Admission: RE | Admit: 2019-03-05 | Discharge: 2019-03-05 | Disposition: A | Discharge status: Home / Self Care | Payer: BC Managed Care – PPO | Source: Ambulatory Visit | Attending: Gastroenterology | Admitting: Gastroenterology

## 2019-03-05 DIAGNOSIS — Z20828 Contact with and (suspected) exposure to other viral communicable diseases: Secondary | ICD-10-CM | POA: Diagnosis not present

## 2019-03-05 DIAGNOSIS — Z01812 Encounter for preprocedural laboratory examination: Secondary | ICD-10-CM | POA: Diagnosis not present

## 2019-03-05 LAB — SARS CORONAVIRUS 2 (TAT 6-24 HRS): SARS Coronavirus 2: NEGATIVE

## 2019-03-08 ENCOUNTER — Ambulatory Visit: Payer: BC Managed Care – PPO | Admitting: Certified Registered"

## 2019-03-08 ENCOUNTER — Encounter
Admission: RE | Disposition: A | Discharge status: Home / Self Care | Payer: Self-pay | Source: Home / Self Care | Attending: Gastroenterology

## 2019-03-08 ENCOUNTER — Other Ambulatory Visit: Payer: Self-pay

## 2019-03-08 ENCOUNTER — Ambulatory Visit
Admission: RE | Admit: 2019-03-08 | Discharge: 2019-03-08 | Disposition: A | Discharge status: Home / Self Care | Payer: BC Managed Care – PPO | Attending: Gastroenterology | Admitting: Gastroenterology

## 2019-03-08 DIAGNOSIS — E119 Type 2 diabetes mellitus without complications: Secondary | ICD-10-CM | POA: Insufficient documentation

## 2019-03-08 DIAGNOSIS — D120 Benign neoplasm of cecum: Secondary | ICD-10-CM | POA: Insufficient documentation

## 2019-03-08 DIAGNOSIS — M797 Fibromyalgia: Secondary | ICD-10-CM | POA: Insufficient documentation

## 2019-03-08 DIAGNOSIS — Z801 Family history of malignant neoplasm of trachea, bronchus and lung: Secondary | ICD-10-CM | POA: Diagnosis not present

## 2019-03-08 DIAGNOSIS — F329 Major depressive disorder, single episode, unspecified: Secondary | ICD-10-CM | POA: Insufficient documentation

## 2019-03-08 DIAGNOSIS — K5730 Diverticulosis of large intestine without perforation or abscess without bleeding: Secondary | ICD-10-CM | POA: Diagnosis not present

## 2019-03-08 DIAGNOSIS — Z7951 Long term (current) use of inhaled steroids: Secondary | ICD-10-CM | POA: Insufficient documentation

## 2019-03-08 DIAGNOSIS — Z811 Family history of alcohol abuse and dependence: Secondary | ICD-10-CM | POA: Insufficient documentation

## 2019-03-08 DIAGNOSIS — Z888 Allergy status to other drugs, medicaments and biological substances status: Secondary | ICD-10-CM | POA: Insufficient documentation

## 2019-03-08 DIAGNOSIS — Z800 Family history of malignant neoplasm of digestive organs: Secondary | ICD-10-CM | POA: Insufficient documentation

## 2019-03-08 DIAGNOSIS — Z8261 Family history of arthritis: Secondary | ICD-10-CM | POA: Insufficient documentation

## 2019-03-08 DIAGNOSIS — Z833 Family history of diabetes mellitus: Secondary | ICD-10-CM | POA: Insufficient documentation

## 2019-03-08 DIAGNOSIS — Z85828 Personal history of other malignant neoplasm of skin: Secondary | ICD-10-CM | POA: Insufficient documentation

## 2019-03-08 DIAGNOSIS — E039 Hypothyroidism, unspecified: Secondary | ICD-10-CM | POA: Insufficient documentation

## 2019-03-08 DIAGNOSIS — D125 Benign neoplasm of sigmoid colon: Secondary | ICD-10-CM | POA: Insufficient documentation

## 2019-03-08 DIAGNOSIS — J449 Chronic obstructive pulmonary disease, unspecified: Secondary | ICD-10-CM | POA: Insufficient documentation

## 2019-03-08 DIAGNOSIS — I499 Cardiac arrhythmia, unspecified: Secondary | ICD-10-CM | POA: Insufficient documentation

## 2019-03-08 DIAGNOSIS — M1990 Unspecified osteoarthritis, unspecified site: Secondary | ICD-10-CM | POA: Diagnosis not present

## 2019-03-08 DIAGNOSIS — Z803 Family history of malignant neoplasm of breast: Secondary | ICD-10-CM | POA: Diagnosis not present

## 2019-03-08 DIAGNOSIS — K6389 Other specified diseases of intestine: Secondary | ICD-10-CM

## 2019-03-08 DIAGNOSIS — Z791 Long term (current) use of non-steroidal anti-inflammatories (NSAID): Secondary | ICD-10-CM | POA: Insufficient documentation

## 2019-03-08 DIAGNOSIS — K635 Polyp of colon: Secondary | ICD-10-CM

## 2019-03-08 DIAGNOSIS — F17210 Nicotine dependence, cigarettes, uncomplicated: Secondary | ICD-10-CM | POA: Insufficient documentation

## 2019-03-08 DIAGNOSIS — Z79899 Other long term (current) drug therapy: Secondary | ICD-10-CM | POA: Diagnosis not present

## 2019-03-08 DIAGNOSIS — Z7984 Long term (current) use of oral hypoglycemic drugs: Secondary | ICD-10-CM | POA: Diagnosis not present

## 2019-03-08 DIAGNOSIS — Z1211 Encounter for screening for malignant neoplasm of colon: Secondary | ICD-10-CM

## 2019-03-08 DIAGNOSIS — F419 Anxiety disorder, unspecified: Secondary | ICD-10-CM | POA: Diagnosis not present

## 2019-03-08 DIAGNOSIS — I10 Essential (primary) hypertension: Secondary | ICD-10-CM | POA: Diagnosis not present

## 2019-03-08 DIAGNOSIS — K5790 Diverticulosis of intestine, part unspecified, without perforation or abscess without bleeding: Secondary | ICD-10-CM | POA: Diagnosis not present

## 2019-03-08 DIAGNOSIS — D123 Benign neoplasm of transverse colon: Secondary | ICD-10-CM | POA: Insufficient documentation

## 2019-03-08 DIAGNOSIS — Z980 Intestinal bypass and anastomosis status: Secondary | ICD-10-CM | POA: Diagnosis not present

## 2019-03-08 DIAGNOSIS — K219 Gastro-esophageal reflux disease without esophagitis: Secondary | ICD-10-CM | POA: Diagnosis not present

## 2019-03-08 DIAGNOSIS — Z8489 Family history of other specified conditions: Secondary | ICD-10-CM | POA: Insufficient documentation

## 2019-03-08 DIAGNOSIS — Z8249 Family history of ischemic heart disease and other diseases of the circulatory system: Secondary | ICD-10-CM | POA: Insufficient documentation

## 2019-03-08 HISTORY — PX: COLONOSCOPY WITH PROPOFOL: SHX5780

## 2019-03-08 LAB — GLUCOSE, CAPILLARY: Glucose-Capillary: 117 mg/dL — ABNORMAL HIGH (ref 70–99)

## 2019-03-08 SURGERY — COLONOSCOPY WITH PROPOFOL
Anesthesia: General

## 2019-03-08 MED ORDER — PROPOFOL 500 MG/50ML IV EMUL
INTRAVENOUS | Status: DC | PRN
  Administered 2019-03-08: 175 ug/kg/min via INTRAVENOUS

## 2019-03-08 MED ORDER — SODIUM CHLORIDE 0.9 % IV SOLN
INTRAVENOUS | Status: DC
  Administered 2019-03-08: 10:00:00 via INTRAVENOUS

## 2019-03-08 MED ORDER — PROPOFOL 10 MG/ML IV BOLUS
INTRAVENOUS | Status: DC | PRN
  Administered 2019-03-08 (×6): 50 mg via INTRAVENOUS

## 2019-03-08 NOTE — Transfer of Care (Signed)
Immediate Anesthesia Transfer of Care Note  Patient: Linda Bradford  Procedure(s) Performed: COLONOSCOPY WITH PROPOFOL (N/A )  Patient Location: Endoscopy Unit  Anesthesia Type:General  Level of Consciousness: awake, alert  and oriented  Airway & Oxygen Therapy: Patient Spontanous Breathing and Patient connected to face mask oxygen  Post-op Assessment: Report given to RN and Post -op Vital signs reviewed and stable  Post vital signs: Reviewed and stable  Last Vitals:  Vitals Value Taken Time  BP 123/78 03/08/19 1138  Temp    Pulse 77 03/08/19 1138  Resp 13 03/08/19 1138  SpO2 96 % 03/08/19 1138    Last Pain:  Vitals:   03/08/19 1130  TempSrc: (P) Tympanic  PainSc:          Complications: No apparent anesthesia complications

## 2019-03-08 NOTE — Op Note (Signed)
[facility] Gastroenterology Patient Name: Linda Bradford Procedure Date: 03/08/2019 10:35 AM MRN: [id number] Account #: [id number] Date of Birth: 1956-01-12 Admit Type: Outpatient Age: 63 Room: [facility] ENDO ROOM 1 Gender: Female Note Status: Finalized Procedure:            Colonoscopy Indications:          Screening for colorectal malignant neoplasm Providers:            Teona Vargus B. Bonna Gains MD, MD Referring MD:         Mar Daring (Referring MD) Medicines:            Monitored Anesthesia Care Complications:        No immediate complications. Procedure:            Pre-Anesthesia Assessment:                       - ASA Grade Assessment: II - A patient with mild                        systemic disease.                       - Prior to the procedure, a History and Physical was                        performed, and patient medications, allergies and                        sensitivities were reviewed. The patient's tolerance of                        previous anesthesia was reviewed.                       - The risks and benefits of the procedure and the                        sedation options and risks were discussed with the                        patient. All questions were answered and informed                        consent was obtained.                       - Patient identification and proposed procedure were                        verified prior to the procedure by the physician, the                        nurse, the anesthesiologist, the anesthetist and the                        technician. The procedure was verified in the procedure                        room.  After obtaining informed consent, the colonoscope was                        passed under direct vision. Throughout the procedure,                        the patient's blood pressure, pulse, and oxygen                        saturations were monitored continuously. The                   Colonoscope was introduced through the anus and                        advanced to the the cecum, identified by appendiceal                        orifice and ileocecal valve. The colonoscopy was                        performed with ease. The patient tolerated the                        procedure well. The quality of the bowel preparation                        was good. Findings:      The perianal and digital rectal examinations were normal.      Two flat polyps were found in the cecum. The polyps were 3 to 4 mm in       size. These polyps were removed with a jumbo cold forceps. Resection and       retrieval were complete.      A localized area of mildly thickened folds of the mucosa was found in       the ascending colon. Biopsies were taken with a cold forceps for       histology.      Four flat polyps were found in the sigmoid colon and transverse colon.       The polyps were 3 to 4 mm in size. These polyps were removed with a       jumbo cold forceps. Resection and retrieval were complete.      There was evidence of a prior end-to-end colo-colonic anastomosis in the       sigmoid colon. This was patent and was characterized by an intact       appearance, mild narrowing. The anastomosis was traversed.      Multiple diverticula were found in the sigmoid colon.      The exam was otherwise without abnormality.      The rectum, sigmoid colon, descending colon, transverse colon, ascending       colon and cecum appeared normal.      The retroflexed view of the distal rectum and anal verge was normal and       showed no anal or rectal abnormalities. Impression:           - Two 3 to 4 mm polyps in the cecum, removed with a                        jumbo cold forceps. Resected and  retrieved.                       - Thickened folds of the mucosa in the ascending colon.                        Biopsied.                       - Four 3 to 4 mm polyps in the sigmoid colon and in the                         transverse colon, removed with a jumbo cold forceps.                        Resected and retrieved.                       - Patent end-to-end colo-colonic anastomosis,                        characterized by an intact appearance, mild narrowing.                       - Diverticulosis in the sigmoid colon.                       - The examination was otherwise normal.                       - The rectum, sigmoid colon, descending colon,                        transverse colon, ascending colon and cecum are normal.                       - The distal rectum and anal verge are normal on                        retroflexion view. Recommendation:       - Refer to Dr. Hampton Abbot to discuss area of mild                        narrowing at anastomosis site and if this needs further                        evaluation with Barium Enema study or CT scan. This is                        clinically and endoscopically non-obstructive at this                        time.                       - Discharge patient to home (with escort).                       - High fiber diet.                       - Advance diet as tolerated.                       -  Continue present medications.                       - Await pathology results.                       - Repeat colonoscopy in 3 years.                       - The findings and recommendations were discussed with                        the patient.                       - The findings and recommendations were discussed with                        the patient's family.                       - Return to primary care physician as previously                        scheduled. Procedure Code(s):    --- Professional ---                       [phone], Colonoscopy, flexible; with biopsy, single or                        multiple Diagnosis Code(s):    --- Professional ---                       Z12.11, Encounter for screening for malignant neoplasm                         of colon                       K63.5, Polyp of colon                       K63.89, Other specified diseases of intestine                       K57.30, Diverticulosis of large intestine without                        perforation or abscess without bleeding CPT copyright 2019 American Medical Association. All rights reserved. The codes documented in this report are preliminary and upon coder review may  be revised to meet current compliance requirements.  Vonda Antigua, MD Margretta Sidle B. Bonna Gains MD, MD 03/08/2019 11:55:36 AM This report has been signed electronically. Number of Addenda: 0 Note Initiated On: 03/08/2019 10:35 AM Scope Withdrawal Time: 0 hours 38 minutes 12 seconds  Total Procedure Duration: 0 hours 43 minutes 26 seconds  Estimated Blood Loss: Estimated blood loss: none.      [facility]

## 2019-03-08 NOTE — Anesthesia Postprocedure Evaluation (Signed)
Anesthesia Post Note  Patient: Linda Bradford  Procedure(s) Performed: COLONOSCOPY WITH PROPOFOL (N/A )  Patient location during evaluation: Endoscopy Anesthesia Type: General Level of consciousness: awake and alert and oriented Pain management: pain level controlled Vital Signs Assessment: post-procedure vital signs reviewed and stable Respiratory status: spontaneous breathing, nonlabored ventilation and respiratory function stable Cardiovascular status: blood pressure returned to baseline and stable Postop Assessment: no signs of nausea or vomiting Anesthetic complications: no     Last Vitals:  Vitals:   03/08/19 1148 03/08/19 1149  BP:  137/69  Pulse: 77 73  Resp: 17 13  Temp:    SpO2: 92% 90%    Last Pain:  Vitals:   03/08/19 1130  TempSrc: Tympanic  PainSc:                  Loretha Ure

## 2019-03-08 NOTE — Anesthesia Preprocedure Evaluation (Signed)
Anesthesia Evaluation  Patient identified by MRN, date of birth, ID band Patient awake    Reviewed: Allergy & Precautions, NPO status , Patient's Chart, lab work & pertinent test results  History of Anesthesia Complications Negative for: history of anesthetic complications  Airway Mallampati: III  TM Distance: >3 FB Neck ROM: Full    Dental  (+) Upper Dentures, Lower Dentures   Pulmonary asthma , COPD,  COPD inhaler, Current SmokerPatient did not abstain from smoking.,    breath sounds clear to auscultation- rhonchi (-) wheezing      Cardiovascular hypertension, Pt. on medications (-) CAD, (-) Past MI, (-) Cardiac Stents and (-) CABG  Rhythm:Regular Rate:Normal - Systolic murmurs and - Diastolic murmurs    Neuro/Psych  Headaches, PSYCHIATRIC DISORDERS Anxiety Depression    GI/Hepatic Neg liver ROS, GERD  ,  Endo/Other  diabetes, Oral Hypoglycemic AgentsHypothyroidism   Renal/GU negative Renal ROS     Musculoskeletal  (+) Arthritis , Fibromyalgia -  Abdominal (+) - obese,   Peds  Hematology negative hematology ROS (+)   Anesthesia Other Findings Past Medical History: No date: Anxiety No date: Arthritis No date: Cancer ([place])     Comment:  skin cancer, removed No date: COPD (chronic obstructive pulmonary disease) (HCC) No date: Depression No date: Diabetes mellitus without complication (HCC) No date: Dysrhythmia     Comment:  occasional pvc's. not being treated. No date: Fatigue No date: GERD (gastroesophageal reflux disease) No date: Headache No date: Hypertension No date: Hypothyroidism No date: Thyroid disease   Reproductive/Obstetrics                             Anesthesia Physical Anesthesia Plan  ASA: III  Anesthesia Plan: General   Post-op Pain Management:    Induction: Intravenous  PONV Risk Score and Plan: 1 and Propofol infusion  Airway Management Planned:  Natural Airway  Additional Equipment:   Intra-op Plan:   Post-operative Plan:   Informed Consent: I have reviewed the patients History and Physical, chart, labs and discussed the procedure including the risks, benefits and alternatives for the proposed anesthesia with the patient or authorized representative who has indicated his/her understanding and acceptance.     Dental advisory given  Plan Discussed with: CRNA and Anesthesiologist  Anesthesia Plan Comments:         Anesthesia Quick Evaluation

## 2019-03-08 NOTE — Anesthesia Post-op Follow-up Note (Signed)
Anesthesia QCDR form completed.        

## 2019-03-08 NOTE — H&P (Signed)
Linda Antigua, MD [address] [place], [place], [place], [address] [address], [place], [place], [place], [address] Phone: [phone]  Fax: [phone]  Primary Care Physician:  Mar Daring, PA-C   Pre-Procedure History & Physical: HPI:  Linda Bradford is a 62 y.o. female is here for a colonoscopy.   Past Medical History:  Diagnosis Date  . Anxiety   . Arthritis   . Cancer ([place])    skin cancer, removed  . COPD (chronic obstructive pulmonary disease) ([place])   . Depression   . Diabetes mellitus without complication ([place])   . Dysrhythmia    occasional pvc's. not being treated.  . Fatigue   . GERD (gastroesophageal reflux disease)   . Headache   . Hypertension   . Hypothyroidism   . Thyroid disease     Past Surgical History:  Procedure Laterality Date  . APPENDECTOMY    . COLECTOMY    . COLECTOMY WITH COLOSTOMY CREATION/HARTMANN PROCEDURE N/A 09/03/2018   Procedure: POSSIBLE COLOSTOMY;  Surgeon: Olean Ree, MD;  Location: [facility];  Service: General;  Laterality: N/A;  . CYSTOSCOPY WITH STENT PLACEMENT Bilateral 09/03/2018   Procedure: CYSTOSCOPY WITH STENT PLACEMENT-LIGHTED STENTS;  Surgeon: Hollice Espy, MD;  Location: [facility];  Service: Urology;  Laterality: Bilateral;  . LAPAROSCOPIC SIGMOID COLECTOMY N/A 09/03/2018   Procedure: LAPAROSCOPIC SIGMOID COLECTOMY;  Surgeon: Olean Ree, MD;  Location: [facility];  Service: General;  Laterality: N/A;  Will need bilateral lighted ureteral stents to be placed by Urology at the beginning of case.  Will also need Dr. Genevive Bi to assist.  . percutaneous drainage tube  07/2018   2nd tube placed. not healing in colon, causing a fistula  . TUBAL LIGATION      Prior to Admission medications   Medication Sig Start Date End Date Taking? Authorizing Provider  acetaminophen (TYLENOL) 500 MG tablet Take 1,000 mg by mouth every 6 (six) hours as needed for mild pain or moderate pain.    Yes [provider]   albuterol (PROVENTIL HFA;VENTOLIN HFA) 108 (90 Base) MCG/ACT inhaler Inhale 1 puff into the lungs every 4 (four) hours as needed. 08/04/17  Yes Mar Daring, PA-C  benzonatate (TESSALON) 200 MG capsule Take 1 capsule (200 mg total) by mouth 3 (three) times daily as needed. 11/30/18  Yes Fenton Malling M, PA-C  clonazePAM (KLONOPIN) 0.5 MG tablet TAKE 1/2 TABLET BY MOUTH TWICE DAILY AS NEEDED 09/07/18  Yes Fenton Malling M, PA-C  Cyanocobalamin (RA VITAMIN B-12 TR) 1000 MCG TBCR Take 1,000 mcg by mouth daily.    Yes [provider]  DULoxetine (CYMBALTA) 60 MG capsule TAKE 1 CAPSULE BY MOUTH TWICE DAILY 08/27/18  Yes Fenton Malling M, PA-C  ezetimibe (ZETIA) 10 MG tablet TAKE 1 TABLET BY MOUTH ONCE DAILY 01/02/19  Yes Fenton Malling M, PA-C  fluticasone [facility]) 50 MCG/ACT nasal spray USE 2 SPRAYS IN EACH NOSTRIL ONCE DAILY 08/07/18  Yes Fenton Malling M, PA-C  fluticasone furoate-vilanterol (BREO ELLIPTA) 200-25 MCG/INH AEPB Inhale 1 puff into the lungs daily. 10/22/18  Yes Mar Daring, PA-C  gabapentin (NEURONTIN) 300 MG capsule TAKE 3 CAPSULES BY MOUTH ONCE EVERY MORNING AND 4 CAPSULES AT BEDTIME 01/28/19  Yes Burnette, Jennifer M, PA-C  levothyroxine (SYNTHROID) 75 MCG tablet TAKE 1 TABLET BY MOUTH ONCE DAILY ON AN EMPTY STOMACH. WAIT 30 MINUTES BEFORE TAKING OTHER MEDS. 12/05/18  Yes Mar Daring, PA-C  losartan (COZAAR) 50 MG tablet Take 1 tablet (50 mg total)  by mouth daily. 12/05/18  Yes Mar Daring, PA-C  meclizine (ANTIVERT) 25 MG tablet Take 1 tablet (25 mg total) by mouth 3 (three) times daily as needed for dizziness. 05/15/18  Yes Mar Daring, PA-C  meloxicam (MOBIC) 15 MG tablet TAKE 1 TABLET BY MOUTH ONCE DAILY 10/22/18  Yes Fenton Malling M, PA-C  metFORMIN (GLUCOPHAGE-XR) 500 MG 24 hr tablet TAKE 2 TABLETS BY MOUTH ONCE DAILY WITH SUPPER 02/28/19  Yes Fenton Malling M, PA-C  tiZANidine (ZANAFLEX) 4 MG tablet Take 1 tablet (4  mg total) by mouth at bedtime as needed for muscle spasms. 12/05/18  Yes Mar Daring, PA-C  BD POSIFLUSH 0.9 % SOLN injection  07/30/18   [provider]  diclofenac sodium (VOLTAREN) 1 % GEL Apply 4 g topically 4 (four) times daily as needed. 03/01/18   Mar Daring, PA-C  ergocalciferol (VITAMIN D2) 1.25 MG (50000 UT) capsule 50,000 Units once a week. Once a week on Wednesday    [provider]  glucose blood (CONTOUR NEXT TEST) test strip To check blood sugar once daily 06/09/17   Mar Daring, PA-C  ipratropium-albuterol (DUONEB) 0.5-2.5 (3) MG/3ML SOLN Take 3 mLs by nebulization every 6 (six) hours as needed. 11/30/18   Mar Daring, PA-C  neomycin (MYCIFRADIN) 500 MG tablet  08/14/18   [provider]  nicotine (NICODERM CQ - DOSED IN MG/24 HOURS) 21 mg/24hr patch Place 1 patch (21 mg total) onto the skin daily. Patient not taking: Reported on 12/10/2018 08/17/18   Mar Daring, PA-C  nitrofurantoin, macrocrystal-monohydrate, (MACROBID) 100 MG capsule Take 1 capsule (100 mg total) by mouth 2 (two) times daily. Patient not taking: Reported on 03/08/2019 02/08/19   Mar Daring, PA-C  ondansetron (ZOFRAN) 4 MG tablet Take 1 tablet (4 mg total) by mouth every 8 (eight) hours as needed. 08/17/18   Mar Daring, PA-C  polyethylene glycol (MIRALAX / GLYCOLAX) packet Take 17 g by mouth daily. Patient not taking: Reported on 12/10/2018 06/02/18   Tylene Fantasia, PA-C  pyridOXINE (VITAMIN B-6) 100 MG tablet Take 100 mg by mouth daily.    [provider]  sulfamethoxazole-trimethoprim (BACTRIM DS) 800-160 MG tablet Take 1 tablet by mouth 2 (two) times daily. Patient not taking: Reported on 02/08/2019 01/30/19   Mar Daring, PA-C  THEO-24 300 MG 24 hr capsule TAKE 1 CAPSULE BY MOUTH ONCE DAILY 12/21/18   Mar Daring, PA-C  traMADol (ULTRAM) 50 MG tablet Take 1 tablet (50 mg total) by mouth every 6 (six) hours  as needed. Patient not taking: Reported on 12/10/2018 10/29/18   Mar Daring, PA-C  Vitamin D, Ergocalciferol, (DRISDOL) 50000 units CAPS capsule TAKE 1 CAPSULE BY MOUTH EVERY 7 DAYS Patient taking differently: Take 50,000 Units by mouth every Tuesday.  12/01/17   Mar Daring, PA-C    Allergies as of 02/06/2019 - Review Complete 12/10/2018  Allergen Reaction Noted  . Dexilant [dexlansoprazole] Nausea Only and Other (See Comments) 07/28/2016    Family History  Problem Relation Age of Onset  . Lung cancer Mother   . Alcohol abuse Sister   . Arthritis Sister   . Alcohol abuse Brother   . Heart disease Brother 32  . Heart attack Brother   . Pancreatic cancer Maternal Aunt   . Diabetes Maternal Aunt   . Alcohol abuse Son   . Drug abuse Son   . Drug abuse Son   . Heart disease Father  56  . Diabetes Sister   . Breast cancer Brother   . Alcohol abuse Maternal Uncle   . Alcohol abuse Maternal Grandmother   . Diabetes Maternal Grandmother     Social History   Socioeconomic History  . Marital status: Married    Spouse name: Clair Gulling  . Number of children: 2  . Years of education: Not on file  . Highest education level: Not on file  Occupational History  . Occupation: Careers information officer    Comment: currently employed  Social Needs  . Financial resource strain: Not on file  . Food insecurity    Worry: Not on file    Inability: Not on file  . Transportation needs    Medical: Not on file    Non-medical: Not on file  Tobacco Use  . Smoking status: Current Every Day Smoker    Packs/day: 1.00    Years: 45.00    Pack years: 45.00    Types: Cigarettes    Start date: 03/02/1980  . Smokeless tobacco: Never Used  . Tobacco comment: now at 5 cigs qd!! d/t chantix and patch  Substance and Sexual Activity  . Alcohol use: No    Alcohol/week: 0.0 standard drinks  . Drug use: No  . Sexual activity: Yes  Lifestyle  . Physical activity    Days per week: Not on file    Minutes  per session: Not on file  . Stress: Not on file  Relationships  . Social Herbalist on phone: Not on file    Gets together: Not on file    Attends religious service: Not on file    Active member of club or organization: Not on file    Attends meetings of clubs or organizations: Not on file    Relationship status: Not on file  . Intimate partner violence    Fear of current or ex partner: Not on file    Emotionally abused: Not on file    Physically abused: Not on file    Forced sexual activity: Not on file  Other Topics Concern  . Not on file  Social History Narrative  . Not on file    Review of Systems: See HPI, otherwise negative ROS  Physical Exam: There were no vitals taken for this visit. General:   Alert,  pleasant and cooperative in NAD Head:  Normocephalic and atraumatic. Neck:  Supple; no masses or thyromegaly. Lungs:  Clear throughout to auscultation, normal respiratory effort.    Heart:  +S1, +S2, Regular rate and rhythm, No edema. Abdomen:  Soft, nontender and nondistended. Normal bowel sounds, without guarding, and without rebound.   Neurologic:  Alert and  oriented x4;  grossly normal neurologically.  Impression/Plan: Linda Bradford is here for a colonoscopy to be performed for average risk screening.  Risks, benefits, limitations, and alternatives regarding  colonoscopy have been reviewed with the patient.  Questions have been answered.  All parties agreeable.   Virgel Manifold, MD  03/08/2019, 9:22 AM

## 2019-03-11 ENCOUNTER — Encounter: Payer: Self-pay | Admitting: Gastroenterology

## 2019-03-11 LAB — SURGICAL PATHOLOGY

## 2019-03-12 ENCOUNTER — Encounter: Payer: Self-pay | Admitting: Gastroenterology

## 2019-03-14 ENCOUNTER — Other Ambulatory Visit: Payer: Self-pay

## 2019-03-14 ENCOUNTER — Telehealth: Payer: Self-pay

## 2019-03-14 ENCOUNTER — Encounter: Payer: Self-pay | Admitting: Physician Assistant

## 2019-03-14 ENCOUNTER — Ambulatory Visit (INDEPENDENT_AMBULATORY_CARE_PROVIDER_SITE_OTHER): Payer: BC Managed Care – PPO | Admitting: Physician Assistant

## 2019-03-14 VITALS — BP 130/80 | HR 84 | Temp 97.9°F | Resp 16 | Wt 221.2 lb

## 2019-03-14 DIAGNOSIS — R300 Dysuria: Secondary | ICD-10-CM

## 2019-03-14 DIAGNOSIS — E559 Vitamin D deficiency, unspecified: Secondary | ICD-10-CM

## 2019-03-14 DIAGNOSIS — N3000 Acute cystitis without hematuria: Secondary | ICD-10-CM

## 2019-03-14 DIAGNOSIS — B379 Candidiasis, unspecified: Secondary | ICD-10-CM

## 2019-03-14 DIAGNOSIS — T3695XA Adverse effect of unspecified systemic antibiotic, initial encounter: Secondary | ICD-10-CM

## 2019-03-14 LAB — POCT URINALYSIS DIPSTICK
Bilirubin, UA: NEGATIVE
Blood, UA: NEGATIVE
Glucose, UA: NEGATIVE
Ketones, UA: NEGATIVE
Nitrite, UA: POSITIVE
Protein, UA: NEGATIVE
Spec Grav, UA: 1.01 (ref 1.010–1.025)
Urobilinogen, UA: 0.2 E.U./dL
pH, UA: 6.5 (ref 5.0–8.0)

## 2019-03-14 MED ORDER — FLUCONAZOLE 150 MG PO TABS: 150.0000 mg | ORAL_TABLET | Freq: Every day | ORAL | 0 refills | Status: DC

## 2019-03-14 MED ORDER — SULFAMETHOXAZOLE-TRIMETHOPRIM 800-160 MG PO TABS: 1.0000 | ORAL_TABLET | Freq: Two times a day (BID) | ORAL | 0 refills | Status: DC

## 2019-03-14 MED ORDER — ERGOCALCIFEROL 1.25 MG (50000 UT) PO CAPS: 50000.0000 [IU] | ORAL_CAPSULE | ORAL | 1 refills | Status: DC

## 2019-03-14 MED ORDER — NITROFURANTOIN MONOHYD MACRO 100 MG PO CAPS: 100.0000 mg | ORAL_CAPSULE | Freq: Two times a day (BID) | ORAL | 0 refills | Status: DC

## 2019-03-14 NOTE — Patient Instructions (Signed)

## 2019-03-14 NOTE — Progress Notes (Signed)
Patient: Linda Bradford Female    DOB: 05-Feb-1956   63 y.o.   MRN: [id number] Visit Date: 03/14/2019  Today's Provider: Margaretann Loveless, PA-C   Chief Complaint  Patient presents with  . Urinary Tract Infection   Subjective:     Urinary Tract Infection  This is a new problem. The current episode started today. The problem occurs every urination. The quality of the pain is described as aching and burning. The pain is moderate. There has been no fever. Associated symptoms include frequency and urgency. Pertinent negatives include no chills, discharge, flank pain, hematuria, nausea or vomiting. She has tried nothing for the symptoms.   Infection most likely occurred during bowel prep for colonoscopy as she had an accident the night of prep and woke up and had messed her sheets.    Allergies  Allergen Reactions  . Dexilant [Dexlansoprazole] Nausea Only and Other (See Comments)    Dizziness     Current Outpatient Medications:  .  acetaminophen (TYLENOL) 500 MG tablet, Take 1,000 mg by mouth every 6 (six) hours as needed for mild pain or moderate pain. , Disp: , Rfl:  .  albuterol (PROVENTIL HFA;VENTOLIN HFA) 108 (90 Base) MCG/ACT inhaler, Inhale 1 puff into the lungs every 4 (four) hours as needed., Disp: 1 Inhaler, Rfl: 11 .  clonazePAM (KLONOPIN) 0.5 MG tablet, TAKE 1/2 TABLET BY MOUTH TWICE DAILY AS NEEDED, Disp: 180 tablet, Rfl: 1 .  Cyanocobalamin (RA VITAMIN B-12 TR) 1000 MCG TBCR, Take 1,000 mcg by mouth daily. , Disp: , Rfl:  .  diclofenac sodium (VOLTAREN) 1 % GEL, Apply 4 g topically 4 (four) times daily as needed., Disp: 100 g, Rfl: 1 .  DULoxetine (CYMBALTA) 60 MG capsule, TAKE 1 CAPSULE BY MOUTH TWICE DAILY, Disp: 180 capsule, Rfl: 1 .  ezetimibe (ZETIA) 10 MG tablet, TAKE 1 TABLET BY MOUTH ONCE DAILY, Disp: 90 tablet, Rfl: 1 .  fluticasone (FLONASE) 50 MCG/ACT nasal spray, USE 2 SPRAYS IN EACH NOSTRIL ONCE DAILY, Disp: 48 g, Rfl: 1 .  fluticasone furoate-vilanterol  (BREO ELLIPTA) 200-25 MCG/INH AEPB, Inhale 1 puff into the lungs daily., Disp: 60 each, Rfl: 5 .  gabapentin (NEURONTIN) 300 MG capsule, TAKE 3 CAPSULES BY MOUTH ONCE EVERY MORNING AND 4 CAPSULES AT BEDTIME, Disp: 450 capsule, Rfl: 1 .  glucose blood (CONTOUR NEXT TEST) test strip, To check blood sugar once daily, Disp: 100 each, Rfl: 12 .  ipratropium-albuterol (DUONEB) 0.5-2.5 (3) MG/3ML SOLN, Take 3 mLs by nebulization every 6 (six) hours as needed., Disp: 360 mL, Rfl: 11 .  levothyroxine (SYNTHROID) 75 MCG tablet, TAKE 1 TABLET BY MOUTH ONCE DAILY ON AN EMPTY STOMACH. WAIT 30 MINUTES BEFORE TAKING OTHER MEDS., Disp: 90 tablet, Rfl: 1 .  losartan (COZAAR) 50 MG tablet, Take 1 tablet (50 mg total) by mouth daily., Disp: 90 tablet, Rfl: 1 .  meclizine (ANTIVERT) 25 MG tablet, Take 1 tablet (25 mg total) by mouth 3 (three) times daily as needed for dizziness., Disp: 30 tablet, Rfl: 0 .  meloxicam (MOBIC) 15 MG tablet, TAKE 1 TABLET BY MOUTH ONCE DAILY, Disp: 90 tablet, Rfl: 1 .  metFORMIN (GLUCOPHAGE-XR) 500 MG 24 hr tablet, TAKE 2 TABLETS BY MOUTH ONCE DAILY WITH SUPPER, Disp: 180 tablet, Rfl: 1 .  ondansetron (ZOFRAN) 4 MG tablet, Take 1 tablet (4 mg total) by mouth every 8 (eight) hours as needed., Disp: 20 tablet, Rfl: 1 .  pyridOXINE (VITAMIN B-6) 100 MG tablet,  Take 100 mg by mouth daily., Disp: , Rfl:  .  THEO-24 300 MG 24 hr capsule, TAKE 1 CAPSULE BY MOUTH ONCE DAILY, Disp: 90 capsule, Rfl: 1 .  tiZANidine (ZANAFLEX) 4 MG tablet, Take 1 tablet (4 mg total) by mouth at bedtime as needed for muscle spasms., Disp: 90 tablet, Rfl: 1 .  BD POSIFLUSH 0.9 % SOLN injection, , Disp: , Rfl:  .  ergocalciferol (VITAMIN D2) 1.25 MG (50000 UT) capsule, 50,000 Units once a week. Once a week on Wednesday, Disp: , Rfl:  .  neomycin (MYCIFRADIN) 500 MG tablet, , Disp: , Rfl:  .  nicotine (NICODERM CQ - DOSED IN MG/24 HOURS) 21 mg/24hr patch, Place 1 patch (21 mg total) onto the skin daily. (Patient not  taking: Reported on 12/10/2018), Disp: 56 patch, Rfl: 0 .  polyethylene glycol (MIRALAX / GLYCOLAX) packet, Take 17 g by mouth daily. (Patient not taking: Reported on 12/10/2018), Disp: 14 each, Rfl: 1 .  traMADol (ULTRAM) 50 MG tablet, Take 1 tablet (50 mg total) by mouth every 6 (six) hours as needed. (Patient not taking: Reported on 12/10/2018), Disp: 90 tablet, Rfl: 0 .  Vitamin D, Ergocalciferol, (DRISDOL) 50000 units CAPS capsule, TAKE 1 CAPSULE BY MOUTH EVERY 7 DAYS (Patient not taking: No sig reported), Disp: 12 capsule, Rfl: 3  Review of Systems  Constitutional: Negative for appetite change, chills, fatigue and fever.  Respiratory: Negative for chest tightness and shortness of breath.   Cardiovascular: Negative for chest pain and palpitations.  Gastrointestinal: Negative for abdominal pain, nausea and vomiting.  Genitourinary: Positive for dysuria, enuresis, frequency and urgency. Negative for flank pain and hematuria.  Musculoskeletal: Positive for back pain.  Neurological: Negative for dizziness and weakness.    Social History   Tobacco Use  . Smoking status: Current Every Day Smoker    Packs/day: 1.00    Years: 45.00    Pack years: 45.00    Types: Cigarettes    Start date: 03/02/1980  . Smokeless tobacco: Never Used  . Tobacco comment: now at 5 cigs qd!! d/t chantix and patch  Substance Use Topics  . Alcohol use: No    Alcohol/week: 0.0 standard drinks      Objective:   BP 130/80 (BP Location: Left Arm, Patient Position: Sitting, Cuff Size: Normal)   Pulse 84   Temp 97.9 F (36.6 C) (Oral)   Resp 16   Wt 221 lb 3.2 oz (100.3 kg)   SpO2 94%   BMI 31.74 kg/m  Vitals:   03/14/19 1348  BP: 130/80  Pulse: 84  Resp: 16  Temp: 97.9 F (36.6 C)  TempSrc: Oral  SpO2: 94%  Weight: 221 lb 3.2 oz (100.3 kg)     Physical Exam Vitals signs reviewed.  Constitutional:      General: She is not in acute distress.    Appearance: Normal appearance. She is well-developed.  She is obese. She is not diaphoretic.  Cardiovascular:     Rate and Rhythm: Normal rate and regular rhythm.     Heart sounds: Normal heart sounds. No murmur. No friction rub. No gallop.   Pulmonary:     Effort: Pulmonary effort is normal. No respiratory distress.     Breath sounds: Normal breath sounds. No wheezing or rales.  Abdominal:     General: Bowel sounds are normal. There is no distension.     Palpations: Abdomen is soft. There is no mass.     Tenderness: There is abdominal tenderness in  the suprapubic area. There is no guarding or rebound.  Skin:    General: Skin is warm and dry.  Neurological:     Mental Status: She is alert and oriented to person, place, and time.      No results found for any visits on 03/14/19.     Assessment & Plan    1. Dysuria UA positive. - POCT urinalysis dipstick - Urine Culture  2. Acute cystitis without hematuria Worsening symptoms. UA positive. Will treat empirically with Macrobid as below. Continue to push fluids. Urine sent for culture. Will follow up pending C&S results. She is to call if symptoms do not improve or if they worsen.  - nitrofurantoin, macrocrystal-monohydrate, (MACROBID) 100 MG capsule; Take 1 capsule (100 mg total) by mouth 2 (two) times daily.  Dispense: 20 capsule; Refill: 0  3. Antibiotic-induced yeast infection Gets yeast infections with antibiotics. Diflucan given as below.  - fluconazole (DIFLUCAN) 150 MG tablet; Take 1 tablet (150 mg total) by mouth daily.  Dispense: 3 tablet; Refill: 0  4. Vitamin D deficiency Stable. Diagnosis pulled for medication refill. Continue current medical treatment plan. - ergocalciferol (VITAMIN D2) 1.25 MG (50000 UT) capsule; Take 1 capsule (50,000 Units total) by mouth once a week. Once a week on Wednesday  Dispense: 21 capsule; Refill: 1     Margaretann Loveless, PA-C  [facility]

## 2019-03-14 NOTE — Telephone Encounter (Signed)
Scheduled patient for an appt. Today at 1:20

## 2019-03-14 NOTE — Telephone Encounter (Signed)
Patient would like to bring in a urine sample to be tested for possible UTI. She is having urgency to urinate that started this morning.

## 2019-03-16 LAB — URINE CULTURE

## 2019-03-18 ENCOUNTER — Telehealth: Payer: Self-pay

## 2019-03-18 NOTE — Telephone Encounter (Signed)
-----   Message from Mar Daring, PA-C sent at 03/18/2019  8:38 AM EDT ----- Urine culture was positive for e. Coli but is susceptible to antibiotic you were placed on. Continue until completed.

## 2019-03-18 NOTE — Telephone Encounter (Signed)
Patient advised as directed below. 

## 2019-03-19 ENCOUNTER — Other Ambulatory Visit: Payer: Self-pay | Admitting: Physician Assistant

## 2019-03-19 DIAGNOSIS — F419 Anxiety disorder, unspecified: Secondary | ICD-10-CM

## 2019-04-01 ENCOUNTER — Other Ambulatory Visit: Payer: Self-pay | Admitting: Physician Assistant

## 2019-04-01 DIAGNOSIS — F32A Depression, unspecified: Secondary | ICD-10-CM

## 2019-04-01 DIAGNOSIS — F329 Major depressive disorder, single episode, unspecified: Secondary | ICD-10-CM

## 2019-04-09 ENCOUNTER — Ambulatory Visit (INDEPENDENT_AMBULATORY_CARE_PROVIDER_SITE_OTHER): Payer: BC Managed Care – PPO | Admitting: Surgery

## 2019-04-09 ENCOUNTER — Encounter: Payer: Self-pay | Admitting: Surgery

## 2019-04-09 ENCOUNTER — Other Ambulatory Visit: Payer: Self-pay

## 2019-04-09 VITALS — BP 150/84 | HR 87 | Temp 97.9°F | Ht 69.0 in | Wt 219.0 lb

## 2019-04-09 DIAGNOSIS — K913 Postprocedural intestinal obstruction, unspecified as to partial versus complete: Secondary | ICD-10-CM

## 2019-04-09 NOTE — Patient Instructions (Signed)
Use Fiber supplement daily. May continue Miralax as needed.  Follow-up with our office as needed.  Please call and ask to speak with a nurse if you develop questions or concerns.

## 2019-04-09 NOTE — Progress Notes (Signed)
04/09/2019  History of Present Illness: Linda Bradford is a 63 y.o. female status post laparoscopic sigmoidectomy with end-to-end anastomosis on 09/03/2018 for diverticulitis.  She had a colonoscopy with Dr. Bonna Gains on 03/08/2019 which revealed mild narrowing at the anastomosis site.  She was referred to [place] for further evaluation.  Patient reports that she is not having any abdominal pain or distention.  She is having bowel movements which sometimes she does have constipation and takes MiraLAX for this.  She does not have to strain hard on a regular basis.  She does not take fiber.  Past Medical History: Past Medical History:  Diagnosis Date  . Anxiety   . Arthritis   . Cancer ([place])    skin cancer, removed  . COPD (chronic obstructive pulmonary disease) ([place])   . Depression   . Diabetes mellitus without complication ([place])   . Dysrhythmia    occasional pvc's. not being treated.  . Fatigue   . GERD (gastroesophageal reflux disease)   . Headache   . Hypertension   . Hypothyroidism   . Thyroid disease      Past Surgical History: Past Surgical History:  Procedure Laterality Date  . APPENDECTOMY    . COLECTOMY    . COLECTOMY WITH COLOSTOMY CREATION/HARTMANN PROCEDURE N/A 09/03/2018   Procedure: POSSIBLE COLOSTOMY;  Surgeon: Olean Ree, MD;  Location: [facility] ORS;  Service: General;  Laterality: N/A;  . COLONOSCOPY WITH PROPOFOL N/A 03/08/2019   Procedure: COLONOSCOPY WITH PROPOFOL;  Surgeon: Virgel Manifold, MD;  Location: [facility] END[facility];  Service: Endoscopy;  Laterality: N/A;  . CYSTOSCOPY WITH STENT PLACEMENT Bilateral 09/03/2018   Procedure: CYSTOSCOPY WITH STENT PLACEMENT-LIGHTED STENTS;  Surgeon: Hollice Espy, MD;  Location: [facility] ORS;  Service: Urology;  Laterality: Bilateral;  . LAPAROSCOPIC SIGMOID COLECTOMY N/A 09/03/2018   Procedure: LAPAROSCOPIC SIGMOID COLECTOMY;  Surgeon: Olean Ree, MD;  Location: [facility] ORS;  Service: General;  Laterality: N/A;  Will need bilateral lighted  ureteral stents to be placed by Urology at the beginning of case.  Will also need Dr. Genevive Bi to assist.  . percutaneous drainage tube  07/2018   2nd tube placed. not healing in colon, causing a fistula  . TUBAL LIGATION      Home Medications: Prior to Admission medications   Medication Sig Start Date End Date Taking? Authorizing Provider  acetaminophen (TYLENOL) 500 MG tablet Take 1,000 mg by mouth every 6 (six) hours as needed for mild pain or moderate pain.    Yes [provider]  albuterol (PROVENTIL HFA;VENTOLIN HFA) 108 (90 Base) MCG/ACT inhaler Inhale 1 puff into the lungs every 4 (four) hours as needed. 08/04/17  Yes Mar Daring, PA-C  BD POSIFLUSH 0.9 % SOLN injection  07/30/18  Yes [provider]  clonazePAM (KLONOPIN) 0.5 MG tablet TAKE 1/2 TABLET BY MOUTH TWICE DAILY AS NEEDED 03/19/19  Yes Burnette, Anderson Malta M, PA-C  Cyanocobalamin (RA VITAMIN B-12 TR) 1000 MCG TBCR Take 1,000 mcg by mouth daily.    Yes [provider]  diclofenac sodium (VOLTAREN) 1 % GEL Apply 4 g topically 4 (four) times daily as needed. 03/01/18  Yes Mar Daring, PA-C  DULoxetine (CYMBALTA) 60 MG capsule TAKE 1 CAPSULE BY MOUTH TWICE DAILY 04/01/19  Yes Mar Daring, PA-C  ergocalciferol (VITAMIN D2) 1.25 MG (50000 UT) capsule Take 1 capsule (50,000 Units total) by mouth once a week. Once a week on Wednesday 03/14/19  Yes Burnette, Clearnce Sorrel, PA-C  ezetimibe (ZETIA) 10 MG tablet TAKE 1  TABLET BY MOUTH ONCE DAILY 01/02/19  Yes Fenton Malling M, PA-C  fluticasone [facility]) 50 MCG/ACT nasal spray USE 2 SPRAYS IN EACH NOSTRIL ONCE DAILY 08/07/18  Yes Fenton Malling M, PA-C  fluticasone furoate-vilanterol (BREO ELLIPTA) 200-25 MCG/INH AEPB Inhale 1 puff into the lungs daily. 10/22/18  Yes Mar Daring, PA-C  gabapentin (NEURONTIN) 300 MG capsule TAKE 3 CAPSULES BY MOUTH ONCE EVERY MORNING AND 4 CAPSULES AT BEDTIME 01/28/19  Yes Mar Daring, PA-C  glucose  blood (CONTOUR NEXT TEST) test strip To check blood sugar once daily 06/09/17  Yes Burnette, Jennifer M, PA-C  ipratropium-albuterol (DUONEB) 0.5-2.5 (3) MG/3ML SOLN Take 3 mLs by nebulization every 6 (six) hours as needed. 11/30/18  Yes Mar Daring, PA-C  levothyroxine (SYNTHROID) 75 MCG tablet TAKE 1 TABLET BY MOUTH ONCE DAILY ON AN EMPTY STOMACH. WAIT 30 MINUTES BEFORE TAKING OTHER MEDS. 12/05/18  Yes Mar Daring, PA-C  losartan (COZAAR) 50 MG tablet Take 1 tablet (50 mg total) by mouth daily. 12/05/18  Yes Mar Daring, PA-C  meclizine (ANTIVERT) 25 MG tablet Take 1 tablet (25 mg total) by mouth 3 (three) times daily as needed for dizziness. 05/15/18  Yes Mar Daring, PA-C  meloxicam (MOBIC) 15 MG tablet TAKE 1 TABLET BY MOUTH ONCE DAILY 10/22/18  Yes Fenton Malling M, PA-C  metFORMIN (GLUCOPHAGE-XR) 500 MG 24 hr tablet TAKE 2 TABLETS BY MOUTH ONCE DAILY WITH SUPPER 02/28/19  Yes Burnette, Jennifer M, PA-C  nicotine (NICODERM CQ - DOSED IN MG/24 HOURS) 21 mg/24hr patch Place 1 patch (21 mg total) onto the skin daily. 08/17/18  Yes Mar Daring, PA-C  ondansetron (ZOFRAN) 4 MG tablet Take 1 tablet (4 mg total) by mouth every 8 (eight) hours as needed. 08/17/18  Yes Fenton Malling M, PA-C  polyethylene glycol (MIRALAX / GLYCOLAX) packet Take 17 g by mouth daily. 06/02/18  Yes Edison Simon R, PA-C  pyridOXINE (VITAMIN B-6) 100 MG tablet Take 100 mg by mouth daily.   Yes [provider]  THEO-24 300 MG 24 hr capsule TAKE 1 CAPSULE BY MOUTH ONCE DAILY 12/21/18  Yes Fenton Malling M, PA-C  tiZANidine (ZANAFLEX) 4 MG tablet Take 1 tablet (4 mg total) by mouth at bedtime as needed for muscle spasms. 12/05/18  Yes Mar Daring, PA-C    Allergies: Allergies  Allergen Reactions  . Dexilant [Dexlansoprazole] Nausea Only and Other (See Comments)    Dizziness    Review of Systems: Review of Systems  Constitutional: Negative for chills and  fever.  Respiratory: Negative for shortness of breath.   Cardiovascular: Negative for chest pain.  Gastrointestinal: Positive for constipation. Negative for abdominal pain, nausea and vomiting.    Physical Exam BP (!) 150/84   Pulse 87   Temp 97.9 F (36.6 C)   Ht 5\' 9"  (1.753 m)   Wt 219 lb (99.3 kg)   SpO2 95%   BMI 32.34 kg/m  CONSTITUTIONAL: No acute distress HEENT:  Normocephalic, atraumatic, extraocular motion intact. RESPIRATORY:  Lungs are clear, and breath sounds are equal bilaterally. Normal respiratory effort without pathologic use of accessory muscles. CARDIOVASCULAR: Heart is regular without murmurs, gallops, or rubs. GI: The abdomen is soft, non-distended, non-tender to palpation.  Incisions are well healed, without evidence of hernia. NEUROLOGIC:  Motor and sensation is grossly normal.  Cranial nerves are grossly intact. PSYCH:  Alert and oriented to person, place and time. Affect is normal.  Labs/Imaging: Colonoscopy 03/08/19: Impression:           -  Two 3 to 4 mm polyps in the cecum, removed with a  jumbo cold forceps. Resected and retrieved. - Thickened folds of the mucosa in the ascending colon.  Biopsied. - Four 3 to 4 mm polyps in the sigmoid colon and in the transverse colon, removed with a jumbo cold forceps. Resected and retrieved. - Patent end-to-end colo-colonic anastomosis, characterized by an intact appearance, mild narrowing. - Diverticulosis in the sigmoid colon. - The examination was otherwise normal. - The rectum, sigmoid colon, descending colon, transverse colon, ascending colon and cecum are normal. - The distal rectum and anal verge are normal on retroflexion view.  Assessment and Plan: This is a 63 y.o. female s/p laparoscopic sigmoidectomy, with mild narrowing of the anastomosis.  -Discussed with the patient that the mild narrowing could be due to surgical scarring vs some mild ischemia at the anastomosis.  At this point, she has some mild  constipation but no other signs of complications and is non-obstructive.  No acute surgical need at this point. -Recommended that she start taking fiber such as metamucil or benefiber once daily to help with constipation.  May continue MiraLax as needed as well. -Follow up as needed, particularly if any worsening symptoms, distention, abdominal pain.  Face-to-face time spent with the patient and care providers was 15 minutes, with more than 50% of the time spent counseling, educating, and coordinating care of the patient.     Melvyn Neth, [facility]

## 2019-04-10 DIAGNOSIS — M170 Bilateral primary osteoarthritis of knee: Secondary | ICD-10-CM | POA: Diagnosis not present

## 2019-04-16 ENCOUNTER — Telehealth: Payer: Self-pay | Admitting: Physician Assistant

## 2019-04-16 DIAGNOSIS — R3989 Other symptoms and signs involving the genitourinary system: Secondary | ICD-10-CM

## 2019-04-16 MED ORDER — SULFAMETHOXAZOLE-TRIMETHOPRIM 800-160 MG PO TABS: 1.0000 | ORAL_TABLET | Freq: Two times a day (BID) | ORAL | 0 refills | Status: DC

## 2019-04-16 MED ORDER — PHENAZOPYRIDINE HCL 100 MG PO TABS: 100.0000 mg | ORAL_TABLET | Freq: Three times a day (TID) | ORAL | 0 refills | Status: DC | PRN

## 2019-04-16 NOTE — Telephone Encounter (Signed)
Pt called saying she thinks she has a UTI but this time her lower back is hurting.  There is no appts this afternoon  Please advise  CB#  [phone]

## 2019-04-16 NOTE — Addendum Note (Signed)
Addended by: Mar Daring on: 04/16/2019 06:06 PM   Modules accepted: Orders

## 2019-04-16 NOTE — Telephone Encounter (Signed)
Bactrim sent to [place] for possible UTI

## 2019-04-16 NOTE — Telephone Encounter (Signed)
Pyridium sent for bladder spasm

## 2019-04-16 NOTE — Telephone Encounter (Signed)
Please advise? All schedules are full today.

## 2019-04-16 NOTE — Telephone Encounter (Signed)
Patient was advised. Patient states she is still in slot of pain.

## 2019-04-17 NOTE — Progress Notes (Signed)
Patient: Linda Bradford Female    DOB: Oct 07, 1955   64 y.o.   MRN: [id number] Visit Date: 04/18/2019  Today's Provider: Mar Daring, PA-C   Chief Complaint  Patient presents with  . Referral    Urology   Subjective:    I,[name],RMA am acting as a Education administrator for [employer], PA-C.  HPI  Patient here today because she wants to be refer to Urology because of continuous UTI's. She has had 4 recurrent UTIs over the last 4-5 months. She is on her 3 day on the Bactrim. She reports that she is feeling better. She is feeling tired, still with some back pain and abdominal pain. These have become more frequent since she had diverticulitis recurrent and had to have a partial colectomy for it.   She also complains of worsening left hand tremor. She has it bilaterally but left hand is worse. Has had for many years but noticing it progressing. Does not occur at rest, only with intention.    Allergies  Allergen Reactions  . Dexilant [Dexlansoprazole] Nausea Only and Other (See Comments)    Dizziness     Current Outpatient Medications:  .  acetaminophen (TYLENOL) 500 MG tablet, Take 1,000 mg by mouth every 6 (six) hours as needed for mild pain or moderate pain. , Disp: , Rfl:  .  albuterol (PROVENTIL HFA;VENTOLIN HFA) 108 (90 Base) MCG/ACT inhaler, Inhale 1 puff into the lungs every 4 (four) hours as needed., Disp: 1 Inhaler, Rfl: 11 .  BD POSIFLUSH 0.9 % SOLN injection, , Disp: , Rfl:  .  clonazePAM (KLONOPIN) 0.5 MG tablet, TAKE 1/2 TABLET BY MOUTH TWICE DAILY AS NEEDED, Disp: 180 tablet, Rfl: 1 .  Cyanocobalamin (RA VITAMIN B-12 TR) 1000 MCG TBCR, Take 1,000 mcg by mouth daily. , Disp: , Rfl:  .  diclofenac sodium (VOLTAREN) 1 % GEL, Apply 4 g topically 4 (four) times daily as needed., Disp: 100 g, Rfl: 1 .  DULoxetine (CYMBALTA) 60 MG capsule, TAKE 1 CAPSULE BY MOUTH TWICE DAILY, Disp: 180 capsule, Rfl: 1 .  ergocalciferol (VITAMIN D2) 1.25 MG (50000 UT) capsule,  Take 1 capsule (50,000 Units total) by mouth once a week. Once a week on Wednesday, Disp: 21 capsule, Rfl: 1 .  ezetimibe (ZETIA) 10 MG tablet, TAKE 1 TABLET BY MOUTH ONCE DAILY, Disp: 90 tablet, Rfl: 1 .  fluticasone (FLONASE) 50 MCG/ACT nasal spray, USE 2 SPRAYS IN EACH NOSTRIL ONCE DAILY, Disp: 48 g, Rfl: 1 .  fluticasone furoate-vilanterol (BREO ELLIPTA) 200-25 MCG/INH AEPB, Inhale 1 puff into the lungs daily., Disp: 60 each, Rfl: 5 .  gabapentin (NEURONTIN) 300 MG capsule, TAKE 3 CAPSULES BY MOUTH ONCE EVERY MORNING AND 4 CAPSULES AT BEDTIME, Disp: 450 capsule, Rfl: 1 .  glucose blood (CONTOUR NEXT TEST) test strip, To check blood sugar once daily, Disp: 100 each, Rfl: 12 .  ipratropium-albuterol (DUONEB) 0.5-2.5 (3) MG/3ML SOLN, Take 3 mLs by nebulization every 6 (six) hours as needed., Disp: 360 mL, Rfl: 11 .  levothyroxine (SYNTHROID) 75 MCG tablet, TAKE 1 TABLET BY MOUTH ONCE DAILY ON AN EMPTY STOMACH. WAIT 30 MINUTES BEFORE TAKING OTHER MEDS., Disp: 90 tablet, Rfl: 1 .  losartan (COZAAR) 50 MG tablet, Take 1 tablet (50 mg total) by mouth daily., Disp: 90 tablet, Rfl: 1 .  meclizine (ANTIVERT) 25 MG tablet, Take 1 tablet (25 mg total) by mouth 3 (three) times daily as needed for dizziness., Disp: 30 tablet,  Rfl: 0 .  meloxicam (MOBIC) 15 MG tablet, TAKE 1 TABLET BY MOUTH ONCE DAILY, Disp: 90 tablet, Rfl: 1 .  metFORMIN (GLUCOPHAGE-XR) 500 MG 24 hr tablet, TAKE 2 TABLETS BY MOUTH ONCE DAILY WITH SUPPER, Disp: 180 tablet, Rfl: 1 .  nicotine (NICODERM CQ - DOSED IN MG/24 HOURS) 21 mg/24hr patch, Place 1 patch (21 mg total) onto the skin daily., Disp: 56 patch, Rfl: 0 .  ondansetron (ZOFRAN) 4 MG tablet, Take 1 tablet (4 mg total) by mouth every 8 (eight) hours as needed., Disp: 20 tablet, Rfl: 1 .  phenazopyridine (PYRIDIUM) 100 MG tablet, Take 1 tablet (100 mg total) by mouth 3 (three) times daily as needed for pain., Disp: 10 tablet, Rfl: 0 .  polyethylene glycol (MIRALAX / GLYCOLAX) packet,  Take 17 g by mouth daily., Disp: 14 each, Rfl: 1 .  pyridOXINE (VITAMIN B-6) 100 MG tablet, Take 100 mg by mouth daily., Disp: , Rfl:  .  sulfamethoxazole-trimethoprim (BACTRIM DS) 800-160 MG tablet, Take 1 tablet by mouth 2 (two) times daily., Disp: 20 tablet, Rfl: 0 .  THEO-24 300 MG 24 hr capsule, TAKE 1 CAPSULE BY MOUTH ONCE DAILY, Disp: 90 capsule, Rfl: 1 .  tiZANidine (ZANAFLEX) 4 MG tablet, Take 1 tablet (4 mg total) by mouth at bedtime as needed for muscle spasms., Disp: 90 tablet, Rfl: 1  Review of Systems  Constitutional: Positive for fatigue. Negative for fever.  Respiratory: Positive for wheezing (chronic). Negative for chest tightness and shortness of breath.   Cardiovascular: Negative for chest pain.  Gastrointestinal: Positive for abdominal pain. Negative for nausea.  Genitourinary: Positive for dysuria, frequency and pelvic pain. Negative for flank pain, hematuria, urgency, vaginal bleeding, vaginal discharge and vaginal pain.  Musculoskeletal: Positive for back pain.  Neurological: Positive for tremors.    Social History   Tobacco Use  . Smoking status: Current Every Day Smoker    Packs/day: 1.00    Years: 45.00    Pack years: 45.00    Types: Cigarettes    Start date: 03/02/1980  . Smokeless tobacco: Never Used  . Tobacco comment: now at 5 cigs qd!! d/t chantix and patch  Substance Use Topics  . Alcohol use: No    Alcohol/week: 0.0 standard drinks      Objective:   BP 122/71 (BP Location: Left Arm, Patient Position: Sitting, Cuff Size: Large)   Pulse 93   Temp (!) 97.3 F (36.3 C) (Other (Comment)) Comment (Src): forehead  Resp 16   Wt 219 lb (99.3 kg)   SpO2 95%   BMI 32.34 kg/m  Vitals:   04/18/19 1457  BP: 122/71  Pulse: 93  Resp: 16  Temp: (!) 97.3 F (36.3 C)  TempSrc: Other (Comment)  SpO2: 95%  Weight: 219 lb (99.3 kg)  Body mass index is 32.34 kg/m.   Physical Exam Vitals signs reviewed.  Constitutional:      General: She is not in  acute distress.    Appearance: Normal appearance. She is well-developed. She is obese. She is not diaphoretic.  Neck:     Musculoskeletal: Normal range of motion and neck supple.     Thyroid: No thyromegaly.     Vascular: No JVD.     Trachea: No tracheal deviation.  Cardiovascular:     Rate and Rhythm: Normal rate and regular rhythm.     Heart sounds: Normal heart sounds. No murmur. No friction rub. No gallop.   Pulmonary:     Effort: Pulmonary effort is  normal. No respiratory distress.     Breath sounds: Wheezing present. No rales.  Abdominal:     General: Abdomen is flat. There is no distension.     Tenderness: There is no abdominal tenderness. There is no right CVA tenderness or left CVA tenderness.  Lymphadenopathy:     Cervical: No cervical adenopathy.  Neurological:     Mental Status: She is alert.     Comments: Tremor noted in hands bilaterally (L>R) only with reaching out or using. No tremors at rest      No results found for any visits on 04/18/19.     Assessment & Plan    1. Recurrent UTI Improving with Bactrim and Pyridium currently. Referral placed for urological evaluation as below.  - Ambulatory referral to Urology  2. Essential hypertension Stop losartan and will start Metorpolol as below to see if this helps the tremor more. If not helping tremors, or if not tolerated, will change back to losartan and use another medication for the tremors. (Unable to use propranolol due to non-selective nature and patient with moderate COPD).  - metoprolol succinate (TOPROL-XL) 50 MG 24 hr tablet; Take 1 tablet (50 mg total) by mouth daily. Take with or immediately following a meal.  Dispense: 90 tablet; Refill: 3  3. Essential tremor See above medical treatment plan. - metoprolol succinate (TOPROL-XL) 50 MG 24 hr tablet; Take 1 tablet (50 mg total) by mouth daily. Take with or immediately following a meal.  Dispense: 90 tablet; Refill: [place], PA-C   [facility]

## 2019-04-17 NOTE — Telephone Encounter (Signed)
Patient was advised. Patient scheduled appt for tomorrow.

## 2019-04-18 ENCOUNTER — Ambulatory Visit (INDEPENDENT_AMBULATORY_CARE_PROVIDER_SITE_OTHER): Payer: BC Managed Care – PPO | Admitting: Physician Assistant

## 2019-04-18 ENCOUNTER — Other Ambulatory Visit: Payer: Self-pay

## 2019-04-18 ENCOUNTER — Encounter: Payer: Self-pay | Admitting: Physician Assistant

## 2019-04-18 VITALS — BP 122/71 | HR 93 | Temp 97.3°F | Resp 16 | Wt 219.0 lb

## 2019-04-18 DIAGNOSIS — N390 Urinary tract infection, site not specified: Secondary | ICD-10-CM | POA: Diagnosis not present

## 2019-04-18 DIAGNOSIS — I10 Essential (primary) hypertension: Secondary | ICD-10-CM

## 2019-04-18 DIAGNOSIS — G250 Essential tremor: Secondary | ICD-10-CM

## 2019-04-18 MED ORDER — METOPROLOL SUCCINATE ER 50 MG PO TB24: 50.0000 mg | ORAL_TABLET | Freq: Every day | ORAL | 3 refills | Status: DC

## 2019-04-18 NOTE — Patient Instructions (Signed)

## 2019-04-19 ENCOUNTER — Other Ambulatory Visit: Payer: Self-pay

## 2019-04-19 ENCOUNTER — Encounter: Payer: Self-pay | Admitting: Emergency Medicine

## 2019-04-19 ENCOUNTER — Emergency Department
Admission: EM | Admit: 2019-04-19 | Discharge: 2019-04-19 | Disposition: A | Discharge status: Home / Self Care | Payer: BC Managed Care – PPO | Attending: Student in an Organized Health Care Education/Training Program | Admitting: Student in an Organized Health Care Education/Training Program

## 2019-04-19 ENCOUNTER — Emergency Department: Payer: BC Managed Care – PPO

## 2019-04-19 DIAGNOSIS — F17210 Nicotine dependence, cigarettes, uncomplicated: Secondary | ICD-10-CM | POA: Diagnosis not present

## 2019-04-19 DIAGNOSIS — E119 Type 2 diabetes mellitus without complications: Secondary | ICD-10-CM | POA: Insufficient documentation

## 2019-04-19 DIAGNOSIS — I10 Essential (primary) hypertension: Secondary | ICD-10-CM | POA: Insufficient documentation

## 2019-04-19 DIAGNOSIS — K5730 Diverticulosis of large intestine without perforation or abscess without bleeding: Secondary | ICD-10-CM | POA: Diagnosis not present

## 2019-04-19 DIAGNOSIS — Z7984 Long term (current) use of oral hypoglycemic drugs: Secondary | ICD-10-CM | POA: Diagnosis not present

## 2019-04-19 DIAGNOSIS — M545 Low back pain, unspecified: Secondary | ICD-10-CM

## 2019-04-19 DIAGNOSIS — J449 Chronic obstructive pulmonary disease, unspecified: Secondary | ICD-10-CM | POA: Diagnosis not present

## 2019-04-19 DIAGNOSIS — E039 Hypothyroidism, unspecified: Secondary | ICD-10-CM | POA: Diagnosis not present

## 2019-04-19 DIAGNOSIS — M549 Dorsalgia, unspecified: Secondary | ICD-10-CM | POA: Diagnosis not present

## 2019-04-19 DIAGNOSIS — R109 Unspecified abdominal pain: Secondary | ICD-10-CM | POA: Insufficient documentation

## 2019-04-19 DIAGNOSIS — Z79899 Other long term (current) drug therapy: Secondary | ICD-10-CM | POA: Diagnosis not present

## 2019-04-19 DIAGNOSIS — R300 Dysuria: Secondary | ICD-10-CM | POA: Insufficient documentation

## 2019-04-19 LAB — CBC WITH DIFFERENTIAL/PLATELET
Abs Immature Granulocytes: 0.03 10*3/uL (ref 0.00–0.07)
Basophils Absolute: 0 10*3/uL (ref 0.0–0.1)
Basophils Relative: 1 %
Eosinophils Absolute: 0.3 10*3/uL (ref 0.0–0.5)
Eosinophils Relative: 4 %
HCT: 45.2 % (ref 36.0–46.0)
Hemoglobin: 15 g/dL (ref 12.0–15.0)
Immature Granulocytes: 0 %
Lymphocytes Relative: 32 %
Lymphs Abs: 2.2 10*3/uL (ref 0.7–4.0)
MCH: 31.4 pg (ref 26.0–34.0)
MCHC: 33.2 g/dL (ref 30.0–36.0)
MCV: 94.6 fL (ref 80.0–100.0)
Monocytes Absolute: 0.7 10*3/uL (ref 0.1–1.0)
Monocytes Relative: 9 %
Neutro Abs: 3.8 10*3/uL (ref 1.7–7.7)
Neutrophils Relative %: 54 %
Platelets: 269 10*3/uL (ref 150–400)
RBC: 4.78 MIL/uL (ref 3.87–5.11)
RDW: 13.2 % (ref 11.5–15.5)
WBC: 7.1 10*3/uL (ref 4.0–10.5)
nRBC: 0 % (ref 0.0–0.2)

## 2019-04-19 LAB — URINALYSIS, COMPLETE (UACMP) WITH MICROSCOPIC
Bacteria, UA: NONE SEEN
Bilirubin Urine: NEGATIVE
Glucose, UA: NEGATIVE mg/dL
Hgb urine dipstick: NEGATIVE
Ketones, ur: NEGATIVE mg/dL
Leukocytes,Ua: NEGATIVE
Nitrite: POSITIVE — AB
Protein, ur: NEGATIVE mg/dL
Specific Gravity, Urine: 1.009 (ref 1.005–1.030)
pH: 6 (ref 5.0–8.0)

## 2019-04-19 LAB — COMPREHENSIVE METABOLIC PANEL
ALT: 30 U/L (ref 0–44)
AST: 25 U/L (ref 15–41)
Albumin: 4.2 g/dL (ref 3.5–5.0)
Alkaline Phosphatase: 102 U/L (ref 38–126)
Anion gap: 7 (ref 5–15)
BUN: 16 mg/dL (ref 8–23)
CO2: 25 mmol/L (ref 22–32)
Calcium: 9.5 mg/dL (ref 8.9–10.3)
Chloride: 105 mmol/L (ref 98–111)
Creatinine, Ser: 0.71 mg/dL (ref 0.44–1.00)
GFR calc Af Amer: >60 mL/min (ref >60)
GFR calc non Af Amer: >60 mL/min (ref >60)
Glucose, Bld: 155 mg/dL — ABNORMAL HIGH (ref 70–99)
Potassium: 4.3 mmol/L (ref 3.5–5.1)
Sodium: 137 mmol/L (ref 135–145)
Total Bilirubin: 0.6 mg/dL (ref 0.3–1.2)
Total Protein: 7.2 g/dL (ref 6.5–8.1)

## 2019-04-19 MED ORDER — IOHEXOL 300 MG/ML  SOLN
100.0000 mL | Freq: Once | INTRAMUSCULAR | Status: AC | PRN
  Administered 2019-04-19: 100 mL via INTRAVENOUS

## 2019-04-19 MED ORDER — ONDANSETRON HCL 4 MG/2ML IJ SOLN
4.0000 mg | Freq: Once | INTRAMUSCULAR | Status: AC
  Administered 2019-04-19: 4 mg via INTRAVENOUS
  Filled 2019-04-19: qty 2

## 2019-04-19 MED ORDER — MORPHINE SULFATE (PF) 4 MG/ML IV SOLN
4.0000 mg | INTRAVENOUS | Status: DC | PRN
  Administered 2019-04-19: 4 mg via INTRAVENOUS
  Filled 2019-04-19: qty 1

## 2019-04-19 MED ORDER — HYDROCODONE-ACETAMINOPHEN 5-325 MG PO TABS: 1.0000 | ORAL_TABLET | Freq: Four times a day (QID) | ORAL | 0 refills | Status: AC | PRN

## 2019-04-19 NOTE — ED Provider Notes (Signed)
[facility] Emergency Department Provider Note    First MD Initiated Contact with Patient 04/19/19 [phone]     (approximate)  I have reviewed the triage vital signs and the nursing notes.   HISTORY  Chief Complaint Back Pain    HPI Linda Bradford is a 63 y.o. female with the below listed past medical history frequent UTIs recently started on Bactrim and Pyridium presents the ER for less than 24 hours of left low back pain.  States is difficult for her to find a comfortable position.  Denies any history of kidney stones.  States that she is having some dysuria and so has taken the Pyridium.  Denies any new numbness or tingling.  No measured fevers.  Does have a history of complicated diverticulitis status post colectomy due to multiple abscesses with residual diverticuli.  Did take Tylenol Motrin this morning without improvement in symptoms.  No nausea or vomiting.    Past Medical History:  Diagnosis Date  . Anxiety   . Arthritis   . Cancer (HCC)    skin cancer, removed  . COPD (chronic obstructive pulmonary disease) (HCC)   . Depression   . Diabetes mellitus without complication (HCC)   . Dysrhythmia    occasional pvc's. not being treated.  . Fatigue   . GERD (gastroesophageal reflux disease)   . Headache   . Hypertension   . Hypothyroidism   . Thyroid disease    Family History  Problem Relation Age of Onset  . Lung cancer Mother   . Alcohol abuse Sister   . Arthritis Sister   . Alcohol abuse Brother   . Heart disease Brother 73  . Heart attack Brother   . Pancreatic cancer Maternal Aunt   . Diabetes Maternal Aunt   . Alcohol abuse Son   . Drug abuse Son   . Drug abuse Son   . Heart disease Father 28  . Diabetes Sister   . Breast cancer Brother   . Alcohol abuse Maternal Uncle   . Alcohol abuse Maternal Grandmother   . Diabetes Maternal Grandmother    Past Surgical History:  Procedure Laterality Date  . APPENDECTOMY    . COLECTOMY    .  COLECTOMY WITH COLOSTOMY CREATION/HARTMANN PROCEDURE N/A 09/03/2018   Procedure: POSSIBLE COLOSTOMY;  Surgeon: Henrene Dodge, MD;  Location: [facility] ORS;  Service: General;  Laterality: N/A;  . COLONOSCOPY WITH PROPOFOL N/A 03/08/2019   Procedure: COLONOSCOPY WITH PROPOFOL;  Surgeon: Pasty Spillers, MD;  Location: [facility] ENDOSCOPY;  Service: Endoscopy;  Laterality: N/A;  . CYSTOSCOPY WITH STENT PLACEMENT Bilateral 09/03/2018   Procedure: CYSTOSCOPY WITH STENT PLACEMENT-LIGHTED STENTS;  Surgeon: Vanna Scotland, MD;  Location: [facility] ORS;  Service: Urology;  Laterality: Bilateral;  . LAPAROSCOPIC SIGMOID COLECTOMY N/A 09/03/2018   Procedure: LAPAROSCOPIC SIGMOID COLECTOMY;  Surgeon: Henrene Dodge, MD;  Location: [facility] ORS;  Service: General;  Laterality: N/A;  Will need bilateral lighted ureteral stents to be placed by Urology at the beginning of case.  Will also need Dr. Thelma Barge to assist.  . percutaneous drainage tube  07/2018   2nd tube placed. not healing in colon, causing a fistula  . TUBAL LIGATION     Patient Active Problem List   Diagnosis Date Noted  . Diverticulitis of large intestine with abscess 05/27/2018  . Diabetes mellitus without complication (HCC) 10/13/2017  . Lateral epicondylitis 07/07/2017  . Personal history of tobacco use, presenting hazards to health 05/03/2016  . Bronchitis, chronic (HCC) 03/03/2015  .  Depression, major, recurrent, moderate (HCC) 11/05/2014  . Anxiety, generalized 11/05/2014  . Alcohol abuse, in remission 11/05/2014  . Nicotine addiction 11/05/2014  . H/O: osteoarthritis 11/05/2014  . Fibromyalgia 11/05/2014  . Restless leg syndrome 11/05/2014  . Arthralgia of hip 01/13/2014  . Adiposity 01/13/2014  . Disordered sleep 01/13/2014  . Airway hyperreactivity 12/17/2013  . Adult hypothyroidism 12/17/2013  . Compulsive tobacco user syndrome 12/17/2013  . Current tobacco use 12/17/2013  . Basal cell carcinoma 11/04/2013  . Arthropathia 02/08/2012  . Arthritis  of knee, left 02/08/2012      Prior to Admission medications   Medication Sig Start Date End Date Taking? Authorizing Provider  acetaminophen (TYLENOL) 500 MG tablet Take 1,000 mg by mouth every 6 (six) hours as needed for mild pain or moderate pain.    Yes [provider]  albuterol (PROVENTIL HFA;VENTOLIN HFA) 108 (90 Base) MCG/ACT inhaler Inhale 1 puff into the lungs every 4 (four) hours as needed. Patient taking differently: Inhale 1 puff into the lungs every 4 (four) hours as needed for wheezing or shortness of breath.  08/04/17  Yes Burnette, Alessandra Bevels, PA-C  clonazePAM (KLONOPIN) 0.5 MG tablet TAKE 1/2 TABLET BY MOUTH TWICE DAILY AS NEEDED Patient taking differently: Take 0.25 mg by mouth 2 (two) times daily as needed for anxiety.  03/19/19  Yes Margaretann Loveless, PA-C  diclofenac sodium (VOLTAREN) 1 % GEL Apply 4 g topically 4 (four) times daily as needed. Patient taking differently: Apply 4 g topically 4 (four) times daily as needed (pain). Apply to knees 03/01/18  Yes Burnette, Alessandra Bevels, PA-C  DULoxetine (CYMBALTA) 60 MG capsule TAKE 1 CAPSULE BY MOUTH TWICE DAILY Patient taking differently: Take 60 mg by mouth 2 (two) times daily.  04/01/19  Yes Margaretann Loveless, PA-C  ergocalciferol (VITAMIN D2) 1.25 MG (50000 UT) capsule Take 1 capsule (50,000 Units total) by mouth once a week. Once a week on Wednesday Patient taking differently: Take 50,000 Units by mouth every Wednesday.  03/14/19  Yes Joycelyn Man M, PA-C  ezetimibe (ZETIA) 10 MG tablet TAKE 1 TABLET BY MOUTH ONCE DAILY Patient taking differently: Take 10 mg by mouth daily.  01/02/19  Yes Burnette, Victorino Dike M, PA-C  fluticasone (FLONASE) 50 MCG/ACT nasal spray USE 2 SPRAYS IN EACH NOSTRIL ONCE DAILY Patient taking differently: Place 2 sprays into both nostrils daily as needed for allergies or rhinitis.  08/07/18  Yes Joycelyn Man M, PA-C  fluticasone furoate-vilanterol (BREO ELLIPTA) 200-25 MCG/INH AEPB  Inhale 1 puff into the lungs daily. 10/22/18  Yes Margaretann Loveless, PA-C  gabapentin (NEURONTIN) 300 MG capsule TAKE 3 CAPSULES BY MOUTH ONCE EVERY MORNING AND 4 CAPSULES AT BEDTIME Patient taking differently: Take 900-1,200 mg by mouth See admin instructions. 900 mg every morning and 1200 mg at bedtime 01/28/19  Yes Burnette, Jennifer M, PA-C  ipratropium-albuterol (DUONEB) 0.5-2.5 (3) MG/3ML SOLN Take 3 mLs by nebulization every 6 (six) hours as needed. Patient taking differently: Take 3 mLs by nebulization every 6 (six) hours as needed (wheezing).  11/30/18  Yes Margaretann Loveless, PA-C  levothyroxine (SYNTHROID) 75 MCG tablet TAKE 1 TABLET BY MOUTH ONCE DAILY ON AN EMPTY STOMACH. WAIT 30 MINUTES BEFORE TAKING OTHER MEDS. Patient taking differently: Take 75 mcg by mouth daily before breakfast.  12/05/18  Yes Burnette, Alessandra Bevels, PA-C  meclizine (ANTIVERT) 25 MG tablet Take 1 tablet (25 mg total) by mouth 3 (three) times daily as needed for dizziness. 05/15/18  Yes Joycelyn Man  M, PA-C  meloxicam (MOBIC) 15 MG tablet TAKE 1 TABLET BY MOUTH ONCE DAILY Patient taking differently: Take 15 mg by mouth daily.  10/22/18  Yes Joycelyn Man M, PA-C  metFORMIN (GLUCOPHAGE-XR) 500 MG 24 hr tablet TAKE 2 TABLETS BY MOUTH ONCE DAILY WITH SUPPER Patient taking differently: Take 1,000 mg by mouth daily with supper.  02/28/19  Yes Margaretann Loveless, PA-C  ondansetron (ZOFRAN) 4 MG tablet Take 1 tablet (4 mg total) by mouth every 8 (eight) hours as needed. Patient taking differently: Take 4 mg by mouth every 8 (eight) hours as needed for nausea.  08/17/18  Yes Margaretann Loveless, PA-C  phenazopyridine (PYRIDIUM) 100 MG tablet Take 1 tablet (100 mg total) by mouth 3 (three) times daily as needed for pain. 04/16/19  Yes Joycelyn Man M, PA-C  polyethylene glycol (MIRALAX / GLYCOLAX) packet Take 17 g by mouth daily. 06/02/18  Yes Lynden Oxford R, PA-C  pyridOXINE (VITAMIN B-6) 100 MG tablet Take  100 mg by mouth daily.   Yes [provider]  sulfamethoxazole-trimethoprim (BACTRIM DS) 800-160 MG tablet Take 1 tablet by mouth 2 (two) times daily. 04/16/19  Yes Burnette, Jennifer M, PA-C  THEO-24 300 MG 24 hr capsule TAKE 1 CAPSULE BY MOUTH ONCE DAILY Patient taking differently: Take 300 mg by mouth daily.  12/21/18  Yes Margaretann Loveless, PA-C  tiZANidine (ZANAFLEX) 4 MG tablet Take 1 tablet (4 mg total) by mouth at bedtime as needed for muscle spasms. 12/05/18  Yes Margaretann Loveless, PA-C  vitamin B-12 (CYANOCOBALAMIN) 1000 MCG tablet Take 1,000 mcg by mouth daily.   Yes [provider]  glucose blood (CONTOUR NEXT TEST) test strip To check blood sugar once daily 06/09/17   Margaretann Loveless, PA-C  HYDROcodone-acetaminophen (NORCO) 5-325 MG tablet Take 1 tablet by mouth every 6 (six) hours as needed for up to 4 days for moderate pain. 04/19/19 04/23/19  Willy Eddy, MD  metoprolol succinate (TOPROL-XL) 50 MG 24 hr tablet Take 1 tablet (50 mg total) by mouth daily. Take with or immediately following a meal. 04/18/19   Burnette, Alessandra Bevels, PA-C    Allergies Dexilant [dexlansoprazole]    Social History Social History   Tobacco Use  . Smoking status: Current Every Day Smoker    Packs/day: 1.00    Years: 45.00    Pack years: 45.00    Types: Cigarettes    Start date: 03/02/1980  . Smokeless tobacco: Never Used  . Tobacco comment: now at 5 cigs qd!! d/t chantix and patch  Substance Use Topics  . Alcohol use: No    Alcohol/week: 0.0 standard drinks  . Drug use: No    Review of Systems Patient denies headaches, rhinorrhea, blurry vision, numbness, shortness of breath, chest pain, edema, cough, abdominal pain, nausea, vomiting, diarrhea, dysuria, fevers, rashes or hallucinations unless otherwise stated above in HPI. ____________________________________________   PHYSICAL EXAM:  VITAL SIGNS: Vitals:   04/19/19 0524 04/19/19 0808  BP: 135/64 (!) 148/63    Pulse: 95 76  Resp: 18 18  Temp: 98.1 F (36.7 C)   SpO2: 95% 93%    Constitutional: Alert and oriented.  Eyes: Conjunctivae are normal.  Head: Atraumatic. Nose: No congestion/rhinnorhea. Mouth/Throat: Mucous membranes are moist.   Neck: No stridor. Painless ROM.  Cardiovascular: Normal rate, regular rhythm. Grossly normal heart sounds.  Good peripheral circulation. Respiratory: Normal respiratory effort.  No retractions. Lungs CTAB. Gastrointestinal: Soft and nontender. No distention. No abdominal bruits. + Left CVA  tenderness. Genitourinary:  Musculoskeletal: No lower extremity tenderness nor edema.  No joint effusions. Neurologic:  Normal speech and language. No gross focal neurologic deficits are appreciated. No facial droop Skin:  Skin is warm, dry and intact. No rash noted. Psychiatric: Mood and affect are normal. Speech and behavior are normal.  ____________________________________________   LABS (all labs ordered are listed, but only abnormal results are displayed)  Results for orders placed or performed during the hospital encounter of 04/19/19 (from the past 24 hour(s))  Urinalysis, Complete w Microscopic     Status: Abnormal   Collection Time: 04/19/19  5:31 AM  Result Value Ref Range   Color, Urine AMBER (A) YELLOW   APPearance CLEAR (A) CLEAR   Specific Gravity, Urine 1.009 1.005 - 1.030   pH 6.0 5.0 - 8.0   Glucose, UA NEGATIVE NEGATIVE mg/dL   Hgb urine dipstick NEGATIVE NEGATIVE   Bilirubin Urine NEGATIVE NEGATIVE   Ketones, ur NEGATIVE NEGATIVE mg/dL   Protein, ur NEGATIVE NEGATIVE mg/dL   Nitrite POSITIVE (A) NEGATIVE   Leukocytes,Ua NEGATIVE NEGATIVE   RBC / HPF 0-5 0 - 5 RBC/hpf   WBC, UA 0-5 0 - 5 WBC/hpf   Bacteria, UA NONE SEEN NONE SEEN   Squamous Epithelial / LPF 0-5 0 - 5  CBC with Differential     Status: None   Collection Time: 04/19/19  5:32 AM  Result Value Ref Range   WBC 7.1 4.0 - 10.5 K/uL   RBC 4.78 3.87 - 5.11 MIL/uL   Hemoglobin  15.0 12.0 - 15.0 g/dL   HCT 91.4 78.2 - 95.6 %   MCV 94.6 80.0 - 100.0 fL   MCH 31.4 26.0 - 34.0 pg   MCHC 33.2 30.0 - 36.0 g/dL   RDW 21.3 08.6 - 57.8 %   Platelets 269 150 - 400 K/uL   nRBC 0.0 0.0 - 0.2 %   Neutrophils Relative % 54 %   Neutro Abs 3.8 1.7 - 7.7 K/uL   Lymphocytes Relative 32 %   Lymphs Abs 2.2 0.7 - 4.0 K/uL   Monocytes Relative 9 %   Monocytes Absolute 0.7 0.1 - 1.0 K/uL   Eosinophils Relative 4 %   Eosinophils Absolute 0.3 0.0 - 0.5 K/uL   Basophils Relative 1 %   Basophils Absolute 0.0 0.0 - 0.1 K/uL   Immature Granulocytes 0 %   Abs Immature Granulocytes 0.03 0.00 - 0.07 K/uL  Comprehensive metabolic panel     Status: Abnormal   Collection Time: 04/19/19  5:32 AM  Result Value Ref Range   Sodium 137 135 - 145 mmol/L   Potassium 4.3 3.5 - 5.1 mmol/L   Chloride 105 98 - 111 mmol/L   CO2 25 22 - 32 mmol/L   Glucose, Bld 155 (H) 70 - 99 mg/dL   BUN 16 8 - 23 mg/dL   Creatinine, Ser 4.69 0.44 - 1.00 mg/dL   Calcium 9.5 8.9 - 62.9 mg/dL   Total Protein 7.2 6.5 - 8.1 g/dL   Albumin 4.2 3.5 - 5.0 g/dL   AST 25 15 - 41 U/L   ALT 30 0 - 44 U/L   Alkaline Phosphatase 102 38 - 126 U/L   Total Bilirubin 0.6 0.3 - 1.2 mg/dL   GFR calc non Af Amer >60 >60 mL/min   GFR calc Af Amer >60 >60 mL/min   Anion gap 7 5 - 15   ____________________________________________ ____________________________________________  RADIOLOGY  I personally reviewed all radiographic images ordered to  evaluate for the above acute complaints and reviewed radiology reports and findings.  These findings were personally discussed with the patient.  Please see medical record for radiology report.  ____________________________________________   PROCEDURES  Procedure(s) performed:  Procedures    Critical Care performed: no ____________________________________________   INITIAL IMPRESSION / ASSESSMENT AND PLAN / ED COURSE  Pertinent labs & imaging results that were available  during my care of the patient were reviewed by me and considered in my medical decision making (see chart for details).   DDX: stone, pyelo, diverticulitis, colitis, AAA, msk strain, duobt CE or SS  Linda Bradford is a 63 y.o. who presents to the ED with back pain dysuria as described above.  Already on antibiotics.  Urinalysis shows nitrite positive urine but she is on Pyridium and there is no evidence of bacteria leuk esterase or white cells.  Based on her pain age and risk factors will order CT imaging to exclude remainder of the by differential.  She is neuro intact.  Possible musculoskeletal strain.  Will give pain medications and reassess.  Clinical Course as of Apr 18 852  Fri Apr 19, 2019  1610 Discussed the results of her imaging and laboratory work-up.  Work-up thus far is reassuring.  No evidence of acute intra-abdominal process.  Given her pain may be small ureterolithiasis but no evidence of hydro-no evidence of infection.  She has good lower extremity strength sensation is intact.  No signs or symptoms of cauda equina.  No saddle anesthesia.  No midline tenderness.  No recent surgeries.  Based on presentation do not feel that further diagnostic imaging indicated on an emergent basis and I believe she is appropriate for outpatient follow-up.  We discussed signs and symptoms for which she should return to the ER.  Have discussed with the patient and available family all diagnostics and treatments performed thus far and all questions were answered to the best of my ability. The patient demonstrates understanding and agreement with plan.    [PR]    Clinical Course User Index [PR] Willy Eddy, MD    The patient was evaluated in Emergency Department today for the symptoms described in the history of present illness. He/she was evaluated in the context of the global COVID-19 pandemic, which necessitated consideration that the patient might be at risk for infection with the SARS-CoV-2 virus  that causes COVID-19. Institutional protocols and algorithms that pertain to the evaluation of patients at risk for COVID-19 are in a state of rapid change based on information released by regulatory bodies including the CDC and federal and state organizations. These policies and algorithms were followed during the patient's care in the ED.  As part of my medical decision making, I reviewed the following data within the electronic MEDICAL RECORD NUMBER Nursing notes reviewed and incorporated, Labs reviewed, notes from prior ED visits and [place] Controlled Substance Database   ____________________________________________   FINAL CLINICAL IMPRESSION(S) / ED DIAGNOSES  Final diagnoses:  Acute midline low back pain without sciatica      NEW MEDICATIONS STARTED DURING THIS VISIT:  New Prescriptions   HYDROCODONE-ACETAMINOPHEN (NORCO) 5-325 MG TABLET    Take 1 tablet by mouth every 6 (six) hours as needed for up to 4 days for moderate pain.     Note:  This document was prepared using Dragon voice recognition software and may include unintentional dictation errors.    Willy Eddy, MD 04/19/19 [phone]

## 2019-04-19 NOTE — Discharge Instructions (Addendum)

## 2019-04-19 NOTE — ED Notes (Signed)
Patient transported to CT 

## 2019-04-19 NOTE — ED Triage Notes (Addendum)
Patient ambulatory to triage with steady gait, without difficulty or distress noted, mask in place; pt reports began having lower back pain on Monday radiating into hips & thighs with urinary hesitancy; pt rx bactrim on Monday by PCP but has not had a UA performed; st increased back pain this am unrelieved by tylenol and pyridium; pt does reports hx of frequent UTI's and has a referral to urology

## 2019-04-22 ENCOUNTER — Ambulatory Visit: Payer: BC Managed Care – PPO | Admitting: Physician Assistant

## 2019-04-30 ENCOUNTER — Ambulatory Visit: Payer: BC Managed Care – PPO

## 2019-05-01 ENCOUNTER — Other Ambulatory Visit: Payer: Self-pay

## 2019-05-01 ENCOUNTER — Other Ambulatory Visit: Payer: Self-pay | Admitting: Physician Assistant

## 2019-05-01 ENCOUNTER — Encounter: Payer: Self-pay | Admitting: Physician Assistant

## 2019-05-01 ENCOUNTER — Ambulatory Visit (INDEPENDENT_AMBULATORY_CARE_PROVIDER_SITE_OTHER): Payer: BC Managed Care – PPO | Admitting: Physician Assistant

## 2019-05-01 VITALS — BP 116/70 | HR 74 | Temp 96.9°F | Wt 222.0 lb

## 2019-05-01 DIAGNOSIS — N952 Postmenopausal atrophic vaginitis: Secondary | ICD-10-CM

## 2019-05-01 DIAGNOSIS — B370 Candidal stomatitis: Secondary | ICD-10-CM | POA: Diagnosis not present

## 2019-05-01 DIAGNOSIS — M2550 Pain in unspecified joint: Secondary | ICD-10-CM

## 2019-05-01 DIAGNOSIS — R300 Dysuria: Secondary | ICD-10-CM | POA: Diagnosis not present

## 2019-05-01 DIAGNOSIS — N390 Urinary tract infection, site not specified: Secondary | ICD-10-CM | POA: Diagnosis not present

## 2019-05-01 DIAGNOSIS — T3695XA Adverse effect of unspecified systemic antibiotic, initial encounter: Secondary | ICD-10-CM

## 2019-05-01 DIAGNOSIS — B379 Candidiasis, unspecified: Secondary | ICD-10-CM | POA: Diagnosis not present

## 2019-05-01 LAB — POCT URINALYSIS DIPSTICK
Bilirubin, UA: NEGATIVE
Glucose, UA: NEGATIVE
Ketones, UA: NEGATIVE
Nitrite, UA: POSITIVE
Odor: POSITIVE
Protein, UA: NEGATIVE
Spec Grav, UA: 1.01 (ref 1.010–1.025)
Urobilinogen, UA: 0.2 E.U./dL
pH, UA: 6.5 (ref 5.0–8.0)

## 2019-05-01 MED ORDER — NYSTATIN 100000 UNIT/ML MT SUSP: 5.0000 mL | Freq: Four times a day (QID) | OROMUCOSAL | 0 refills | Status: DC

## 2019-05-01 MED ORDER — NITROFURANTOIN MONOHYD MACRO 100 MG PO CAPS: 100.0000 mg | ORAL_CAPSULE | Freq: Every day | ORAL | 1 refills | Status: DC

## 2019-05-01 MED ORDER — FLUCONAZOLE 150 MG PO TABS: 150.0000 mg | ORAL_TABLET | Freq: Once | ORAL | 0 refills | Status: AC

## 2019-05-01 MED ORDER — AMOXICILLIN-POT CLAVULANATE 875-125 MG PO TABS: 1.0000 | ORAL_TABLET | Freq: Two times a day (BID) | ORAL | 0 refills | Status: DC

## 2019-05-01 NOTE — Progress Notes (Signed)
Patient: Linda Bradford Female    DOB: Dec 19, 1955   63 y.o.   MRN: [id number] Visit Date: 05/01/2019  Today's Provider: Mar Daring, PA-C   Chief Complaint  Patient presents with  . Urinary Tract Infection   Subjective:     Urinary Tract Infection  This is a recurrent problem. The problem has been gradually worsening (Especially since yesterday (04/30/2019)). There has been no fever. Associated symptoms include frequency, hesitancy and urgency. Pertinent negatives include no chills, discharge, flank pain, hematuria, nausea, sweats or vomiting. She has tried increased fluids for the symptoms. The treatment provided no relief. Her past medical history is significant for recurrent UTIs.  Has had recurrent UTIs since June of this year. Possibly from constipation and menopause estrogen deficiency/vaginal atrophy.   Allergies  Allergen Reactions  . Dexilant [Dexlansoprazole] Nausea Only and Other (See Comments)    Dizziness     Current Outpatient Medications:  .  acetaminophen (TYLENOL) 500 MG tablet, Take 1,000 mg by mouth every 6 (six) hours as needed for mild pain or moderate pain. , Disp: , Rfl:  .  albuterol (PROVENTIL HFA;VENTOLIN HFA) 108 (90 Base) MCG/ACT inhaler, Inhale 1 puff into the lungs every 4 (four) hours as needed. (Patient taking differently: Inhale 1 puff into the lungs every 4 (four) hours as needed for wheezing or shortness of breath. ), Disp: 1 Inhaler, Rfl: 11 .  clonazePAM (KLONOPIN) 0.5 MG tablet, TAKE 1/2 TABLET BY MOUTH TWICE DAILY AS NEEDED (Patient taking differently: Take 0.25 mg by mouth 2 (two) times daily as needed for anxiety. ), Disp: 180 tablet, Rfl: 1 .  diclofenac sodium (VOLTAREN) 1 % GEL, Apply 4 g topically 4 (four) times daily as needed. (Patient taking differently: Apply 4 g topically 4 (four) times daily as needed (pain). Apply to knees), Disp: 100 g, Rfl: 1 .  DULoxetine (CYMBALTA) 60 MG capsule, TAKE 1 CAPSULE BY MOUTH TWICE DAILY  (Patient taking differently: Take 60 mg by mouth 2 (two) times daily. ), Disp: 180 capsule, Rfl: 1 .  ergocalciferol (VITAMIN D2) 1.25 MG (50000 UT) capsule, Take 1 capsule (50,000 Units total) by mouth once a week. Once a week on Wednesday (Patient taking differently: Take 50,000 Units by mouth every Wednesday. ), Disp: 21 capsule, Rfl: 1 .  ezetimibe (ZETIA) 10 MG tablet, TAKE 1 TABLET BY MOUTH ONCE DAILY (Patient taking differently: Take 10 mg by mouth daily. ), Disp: 90 tablet, Rfl: 1 .  fluticasone (FLONASE) 50 MCG/ACT nasal spray, USE 2 SPRAYS IN EACH NOSTRIL ONCE DAILY (Patient taking differently: Place 2 sprays into both nostrils daily as needed for allergies or rhinitis. ), Disp: 48 g, Rfl: 1 .  fluticasone furoate-vilanterol (BREO ELLIPTA) 200-25 MCG/INH AEPB, Inhale 1 puff into the lungs daily., Disp: 60 each, Rfl: 5 .  gabapentin (NEURONTIN) 300 MG capsule, TAKE 3 CAPSULES BY MOUTH ONCE EVERY MORNING AND 4 CAPSULES AT BEDTIME (Patient taking differently: Take 900-1,200 mg by mouth See admin instructions. 900 mg every morning and 1200 mg at bedtime), Disp: 450 capsule, Rfl: 1 .  glucose blood (CONTOUR NEXT TEST) test strip, To check blood sugar once daily, Disp: 100 each, Rfl: 12 .  ipratropium-albuterol (DUONEB) 0.5-2.5 (3) MG/3ML SOLN, Take 3 mLs by nebulization every 6 (six) hours as needed. (Patient taking differently: Take 3 mLs by nebulization every 6 (six) hours as needed (wheezing). ), Disp: 360 mL, Rfl: 11 .  levothyroxine (SYNTHROID) 75 MCG tablet, TAKE 1 TABLET BY  MOUTH ONCE DAILY ON AN EMPTY STOMACH. WAIT 30 MINUTES BEFORE TAKING OTHER MEDS. (Patient taking differently: Take 75 mcg by mouth daily before breakfast. ), Disp: 90 tablet, Rfl: 1 .  meclizine (ANTIVERT) 25 MG tablet, Take 1 tablet (25 mg total) by mouth 3 (three) times daily as needed for dizziness., Disp: 30 tablet, Rfl: 0 .  metFORMIN (GLUCOPHAGE-XR) 500 MG 24 hr tablet, TAKE 2 TABLETS BY MOUTH ONCE DAILY WITH SUPPER  (Patient taking differently: Take 1,000 mg by mouth daily with supper. ), Disp: 180 tablet, Rfl: 1 .  metoprolol succinate (TOPROL-XL) 50 MG 24 hr tablet, Take 1 tablet (50 mg total) by mouth daily. Take with or immediately following a meal., Disp: 90 tablet, Rfl: 3 .  ondansetron (ZOFRAN) 4 MG tablet, Take 1 tablet (4 mg total) by mouth every 8 (eight) hours as needed. (Patient taking differently: Take 4 mg by mouth every 8 (eight) hours as needed for nausea. ), Disp: 20 tablet, Rfl: 1 .  polyethylene glycol (MIRALAX / GLYCOLAX) packet, Take 17 g by mouth daily., Disp: 14 each, Rfl: 1 .  pyridOXINE (VITAMIN B-6) 100 MG tablet, Take 100 mg by mouth daily., Disp: , Rfl:  .  THEO-24 300 MG 24 hr capsule, TAKE 1 CAPSULE BY MOUTH ONCE DAILY (Patient taking differently: Take 300 mg by mouth daily. ), Disp: 90 capsule, Rfl: 1 .  tiZANidine (ZANAFLEX) 4 MG tablet, Take 1 tablet (4 mg total) by mouth at bedtime as needed for muscle spasms., Disp: 90 tablet, Rfl: 1 .  vitamin B-12 (CYANOCOBALAMIN) 1000 MCG tablet, Take 1,000 mcg by mouth daily., Disp: , Rfl:  .  meloxicam (MOBIC) 15 MG tablet, TAKE 1 TABLET BY MOUTH ONCE DAILY (Patient taking differently: Take 15 mg by mouth daily. ), Disp: 90 tablet, Rfl: 1 .  phenazopyridine (PYRIDIUM) 100 MG tablet, Take 1 tablet (100 mg total) by mouth 3 (three) times daily as needed for pain. (Patient not taking: Reported on 05/01/2019), Disp: 10 tablet, Rfl: 0 .  sulfamethoxazole-trimethoprim (BACTRIM DS) 800-160 MG tablet, Take 1 tablet by mouth 2 (two) times daily. (Patient not taking: Reported on 05/01/2019), Disp: 20 tablet, Rfl: 0  Review of Systems  Constitutional: Positive for fatigue. Negative for activity change, appetite change, chills, diaphoresis, fever and unexpected weight change.  Gastrointestinal: Positive for abdominal pain and constipation. Negative for abdominal distention, anal bleeding, blood in stool, diarrhea, nausea, rectal pain and vomiting.   Genitourinary: Positive for difficulty urinating, dysuria, frequency, hesitancy and urgency. Negative for decreased urine volume, dyspareunia, enuresis, flank pain, genital sores, hematuria, menstrual problem, pelvic pain, vaginal bleeding, vaginal discharge and vaginal pain.       Pt is complaining of vaginal dryness as well.   Musculoskeletal: Positive for back pain.    Social History   Tobacco Use  . Smoking status: Current Every Day Smoker    Packs/day: 1.00    Years: 45.00    Pack years: 45.00    Types: Cigarettes    Start date: 03/02/1980  . Smokeless tobacco: Never Used  . Tobacco comment: now at 5 cigs qd!! d/t chantix and patch  Substance Use Topics  . Alcohol use: No    Alcohol/week: 0.0 standard drinks      Objective:   BP 116/70 (BP Location: Right Arm, Patient Position: Sitting, Cuff Size: Large)   Pulse 74   Temp (!) 96.9 F (36.1 C) (Temporal)   Wt 222 lb (100.7 kg)   SpO2 96%   BMI 32.78  kg/m  Vitals:   05/01/19 1425  BP: 116/70  Pulse: 74  Temp: (!) 96.9 F (36.1 C)  TempSrc: Temporal  SpO2: 96%  Weight: 222 lb (100.7 kg)  Body mass index is 32.78 kg/m.   Physical Exam Constitutional:      General: She is not in acute distress.    Appearance: Normal appearance. She is well-developed. She is obese. She is not ill-appearing or diaphoretic.  Cardiovascular:     Rate and Rhythm: Normal rate and regular rhythm.     Heart sounds: Normal heart sounds. No murmur. No friction rub. No gallop.   Pulmonary:     Effort: Pulmonary effort is normal. No respiratory distress.     Breath sounds: Normal breath sounds. No wheezing or rales.  Abdominal:     General: Bowel sounds are normal. There is no distension.     Palpations: Abdomen is soft. There is no mass.     Tenderness: There is abdominal tenderness in the suprapubic area. There is no guarding or rebound.  Skin:    General: Skin is warm and dry.  Neurological:     Mental Status: She is alert and  oriented to person, place, and time.      Results for orders placed or performed in visit on 05/01/19  POCT urinalysis dipstick  Result Value Ref Range   Color, UA Yellow    Clarity, UA Cloudy    Glucose, UA Negative Negative   Bilirubin, UA Negative    Ketones, UA Negative    Spec Grav, UA 1.010 1.010 - 1.025   Blood, UA Moderate    pH, UA 6.5 5.0 - 8.0   Protein, UA Negative Negative   Urobilinogen, UA 0.2 0.2 or 1.0 E.U./dL   Nitrite, UA Positive    Leukocytes, UA Large (3+) (A) Negative   Appearance     Odor Positive        Assessment & Plan    1. Recurrent UTI Macrobid given as below for recurrence. She is to start once Augmentin is completed.  - nitrofurantoin, macrocrystal-monohydrate, (MACROBID) 100 MG capsule; Take 1 capsule (100 mg total) by mouth at bedtime.  Dispense: 90 capsule; Refill: 1  2. Dysuria Worsening symptoms. UA positive. Will treat empirically with Augmentin as below. Continue to push fluids. Urine sent for culture. Will follow up pending C&S results. She is to call if symptoms do not improve or if they worsen. Has Urology f/u on 05/20/19. - POCT urinalysis dipstick - Urine Culture - amoxicillin-clavulanate (AUGMENTIN) 875-125 MG tablet; Take 1 tablet by mouth 2 (two) times daily.  Dispense: 20 tablet; Refill: 0  3. Antibiotic-induced yeast infection Gets yeast infections with antibiotics. Diflucan given as below.  - fluconazole (DIFLUCAN) 150 MG tablet; Take 1 tablet (150 mg total) by mouth once for 1 dose. May repeat in 48-72 hrs if needed  Dispense: 2 tablet; Refill: 0  4. Thrush Gets thrush under her temporary denture plate with her inhalers and nebulizer treatments. Will give Nystatin as below.  - nystatin (MYCOSTATIN) 100000 UNIT/ML suspension; Take 5 mLs (500,000 Units total) by mouth 4 (four) times daily.  Dispense: 473 mL; Refill: 0  5. Vaginal atrophy Discussed as possible source of recurrent UTIs. Patient given sample of premarin  0.673mcg cream. She is to [employer] if improving and will send in Rx to pharmacy.      Mar Daring, PA-C  [facility]

## 2019-05-03 ENCOUNTER — Other Ambulatory Visit: Payer: Self-pay | Admitting: Physician Assistant

## 2019-05-03 DIAGNOSIS — J411 Mucopurulent chronic bronchitis: Secondary | ICD-10-CM

## 2019-05-03 LAB — URINE CULTURE

## 2019-05-08 ENCOUNTER — Other Ambulatory Visit: Payer: Self-pay | Admitting: Physician Assistant

## 2019-05-08 DIAGNOSIS — G250 Essential tremor: Secondary | ICD-10-CM

## 2019-05-08 DIAGNOSIS — I10 Essential (primary) hypertension: Secondary | ICD-10-CM

## 2019-05-08 MED ORDER — METOPROLOL SUCCINATE ER 50 MG PO TB24: 50.0000 mg | ORAL_TABLET | Freq: Every day | ORAL | 3 refills | Status: DC

## 2019-05-08 NOTE — Telephone Encounter (Signed)
[facility] faxed refill request for the following medications: ° °metoprolol succinate (TOPROL-XL) 50 MG 24 hr tablet  ° ° ° ° °Please advise. °

## 2019-05-18 ENCOUNTER — Other Ambulatory Visit: Payer: Self-pay | Admitting: Physician Assistant

## 2019-05-18 DIAGNOSIS — E039 Hypothyroidism, unspecified: Secondary | ICD-10-CM

## 2019-05-18 DIAGNOSIS — E7800 Pure hypercholesterolemia, unspecified: Secondary | ICD-10-CM

## 2019-05-20 ENCOUNTER — Ambulatory Visit (INDEPENDENT_AMBULATORY_CARE_PROVIDER_SITE_OTHER): Payer: BC Managed Care – PPO | Admitting: Urology

## 2019-05-20 ENCOUNTER — Encounter: Payer: Self-pay | Admitting: Urology

## 2019-05-20 ENCOUNTER — Other Ambulatory Visit: Payer: Self-pay

## 2019-05-20 ENCOUNTER — Ambulatory Visit: Payer: BC Managed Care – PPO | Admitting: Urology

## 2019-05-20 VITALS — BP 164/80 | HR 84 | Ht 71.0 in | Wt 219.0 lb

## 2019-05-20 DIAGNOSIS — N390 Urinary tract infection, site not specified: Secondary | ICD-10-CM | POA: Diagnosis not present

## 2019-05-20 NOTE — Progress Notes (Signed)
05/20/19 3:25 PM   Linda Bradford 1956-02-22 [id number]  Referring provider: Mar Daring, PA-C [address] [place],  [place] [address]  CC: Recurrent UTIs  HPI: I saw Linda Bradford in urology clinic in consultation for recurrent UTIs from [place], [place].  She is a 63 year old female with past medical history notable for COPD, diabetes, diverticulitis complicated by multiple abscesses, and multiple Klebsiella and E. coli UTIs over the last 4 months.  Her history is notable for multiple abdominal abscesses last fall 2019 from diverticulitis that were managed with drain placement, and she ultimately underwent laparoscopic sigmoid colectomy with Dr. Hampton Abbot in February 2020.  Dr. Hollice Espy performed cystoscopy and placed bilateral ureteral stents at that time which showed no hydronephrosis and cystoscopy was normal.  She also underwent imaging with a CT abdomen pelvis with contrast on 04/19/2019 for abdominal pain that showed no hydronephrosis, diverticulitis, or other abnormalities.  When she gets urinary tract infection she has pelvic and back pain, dysuria, urgency, and frequency.  She denies any history of nephrolithiasis.  She denies any gross hematuria or pneumaturia.  There are no aggravating or alleviating factors.  Severity is moderate.  She has an extensive 75-pack-year smoking history, and continues to smoke 1.5 packs/day.   PMH: Past Medical History:  Diagnosis Date  . Anxiety   . Arthritis   . Cancer ([place])    skin cancer, removed  . COPD (chronic obstructive pulmonary disease) ([place])   . Depression   . Diabetes mellitus without complication ([place])   . Dysrhythmia    occasional pvc's. not being treated.  . Fatigue   . GERD (gastroesophageal reflux disease)   . Headache   . Hypertension   . Hypothyroidism   . Thyroid disease     Surgical History: Past Surgical History:  Procedure Laterality Date  . APPENDECTOMY    . COLECTOMY    . COLECTOMY  WITH COLOSTOMY CREATION/HARTMANN PROCEDURE N/A 09/03/2018   Procedure: POSSIBLE COLOSTOMY;  Surgeon: Olean Ree, MD;  Location: [facility] ORS;  Service: General;  Laterality: N/A;  . COLONOSCOPY WITH PROPOFOL N/A 03/08/2019   Procedure: COLONOSCOPY WITH PROPOFOL;  Surgeon: Virgel Manifold, MD;  Location: [facility] ENDOSCOPY;  Service: Endoscopy;  Laterality: N/A;  . CYSTOSCOPY WITH STENT PLACEMENT Bilateral 09/03/2018   Procedure: CYSTOSCOPY WITH STENT PLACEMENT-LIGHTED STENTS;  Surgeon: Hollice Espy, MD;  Location: [facility] ORS;  Service: Urology;  Laterality: Bilateral;  . LAPAROSCOPIC SIGMOID COLECTOMY N/A 09/03/2018   Procedure: LAPAROSCOPIC SIGMOID COLECTOMY;  Surgeon: Olean Ree, MD;  Location: [facility] ORS;  Service: General;  Laterality: N/A;  Will need bilateral lighted ureteral stents to be placed by Urology at the beginning of case.  Will also need Dr. Genevive Bi to assist.  . percutaneous drainage tube  07/2018   2nd tube placed. not healing in colon, causing a fistula  . TUBAL LIGATION      Allergies:  Allergies  Allergen Reactions  . Dexilant [Dexlansoprazole] Nausea Only and Other (See Comments)    Dizziness    Family History: Family History  Problem Relation Age of Onset  . Lung cancer Mother   . Alcohol abuse Sister   . Arthritis Sister   . Alcohol abuse Brother   . Heart disease Brother 46  . Heart attack Brother   . Pancreatic cancer Maternal Aunt   . Diabetes Maternal Aunt   . Alcohol abuse Son   . Drug abuse Son   . Drug abuse Son   . Heart disease  Father 61  . Diabetes Sister   . Breast cancer Brother   . Alcohol abuse Maternal Uncle   . Alcohol abuse Maternal Grandmother   . Diabetes Maternal Grandmother     Social History:  reports that she has been smoking cigarettes. She started smoking about 39 years ago. She has a 45.00 pack-year smoking history. She has never used smokeless tobacco. She reports that she does not drink alcohol or use drugs.  ROS: Please see  flowsheet from today's date for complete review of systems.  Physical Exam: BP (!) 164/80   Pulse 84   Ht 5\' 11"  (1.803 m)   Wt 219 lb (99.3 kg)   BMI 30.54 kg/m    Constitutional:  Alert and oriented, No acute distress. Cardiovascular: No clubbing, cyanosis, or edema. Respiratory: Normal respiratory effort, no increased work of breathing. GI: Abdomen is soft, nontender, nondistended, no abdominal masses GU: No CVA tenderness, phallus without lesions Lymph: No cervical or inguinal lymphadenopathy. Skin: No rashes, bruises or suspicious lesions. Neurologic: Grossly intact, no focal deficits, moving all 4 extremities. Psychiatric: Normal mood and affect.  Laboratory Data: Urinalysis today benign appearing, 0-5 WBCs, 0 RBCs, few bacteria, nitrate negative  Pertinent Imaging: See HPI  Assessment & Plan:   In summary, the patient is a 63 year old female with extensive history of diverticulitis and abdominal abscess x2, now status post sigmoid colectomy with normal cystoscopy, and normal retrograde pyelograms at that time, as well as an essentially normal CT abdomen pelvis in September 2020 with recurrent UTIs.  We discussed the evaluation and treatment of patients with recurrent UTIs at length.  We specifically discussed the differences between asymptomatic bacteriuria and true urinary tract infection.  We discussed the AUA definition of recurrent UTI of at least 2 culture proven symptomatic acute cystitis episodes in a 44-month period, or 3 within a 1 year period.  We discussed the importance of culture directed antibiotic treatment, and antibiotic stewardship.  First-line therapy includes nitrofurantoin(5 days), Bactrim(3 days), or fosfomycin(3 g single dose).  Possible etiologies of recurrent infection include periurethral tissue atrophy in postmenopausal woman, constipation, sexual activity, incomplete emptying, anatomic abnormalities, and even genetic predisposition.  Finally, we  discussed the role of perineal hygiene, timed voiding, adequate hydration, topical vaginal estrogen, cranberry prophylaxis, and low-dose antibiotic prophylaxis.  We also discussed the role of well-controlled diabetes, and smoking cessation and UTIs.  -Trial of topical estrogen cream, cranberry prophylaxis twice daily, and nitrofurantoin 100 mg daily for prophylaxis -RTC 3 months for symptom check  A total of 60 minutes were spent face-to-face with the patient, greater than 50% was spent in patient education, counseling, and coordination of care regarding recurrent UTIs and history of diverticulitis.   Billey Co, [facility] [address], [place], [place] [address] [phone]

## 2019-05-20 NOTE — Patient Instructions (Signed)

## 2019-05-21 LAB — MICROSCOPIC EXAMINATION: RBC, Urine: NONE SEEN /hpf (ref 0–2)

## 2019-05-21 LAB — URINALYSIS, COMPLETE
Bilirubin, UA: NEGATIVE
Glucose, UA: NEGATIVE
Ketones, UA: NEGATIVE
Leukocytes,UA: NEGATIVE
Nitrite, UA: NEGATIVE
Protein,UA: NEGATIVE
RBC, UA: NEGATIVE
Specific Gravity, UA: 1.015 (ref 1.005–1.030)
Urobilinogen, Ur: 0.2 mg/dL (ref 0.2–1.0)
pH, UA: 5.5 (ref 5.0–7.5)

## 2019-05-22 ENCOUNTER — Other Ambulatory Visit: Payer: Self-pay | Admitting: Physician Assistant

## 2019-05-22 DIAGNOSIS — R110 Nausea: Secondary | ICD-10-CM

## 2019-05-24 ENCOUNTER — Other Ambulatory Visit: Payer: Self-pay | Admitting: Physician Assistant

## 2019-05-24 DIAGNOSIS — M62838 Other muscle spasm: Secondary | ICD-10-CM

## 2019-05-30 DIAGNOSIS — M25511 Pain in right shoulder: Secondary | ICD-10-CM | POA: Diagnosis not present

## 2019-05-30 DIAGNOSIS — M65341 Trigger finger, right ring finger: Secondary | ICD-10-CM | POA: Diagnosis not present

## 2019-05-31 ENCOUNTER — Other Ambulatory Visit: Payer: Self-pay | Admitting: Physician Assistant

## 2019-05-31 ENCOUNTER — Telehealth: Payer: Self-pay

## 2019-05-31 ENCOUNTER — Telehealth: Payer: Self-pay | Admitting: Physician Assistant

## 2019-05-31 DIAGNOSIS — G629 Polyneuropathy, unspecified: Secondary | ICD-10-CM

## 2019-05-31 NOTE — Telephone Encounter (Signed)
Patient called and states that she has pain starting from the middle of her stomach up to the right hand side to just under the diaphragm. She reports gas and bloating. She denies any nausea or vomiting. She has been using Miralax daily and her last bowel movement was yesterday and was a little loose. This episode happened Sunday, 05/26/19 and lasted all day. She states that she is currently having another episode and is concerned about what to do. I will go over this with Dr Hampton Abbot.

## 2019-05-31 NOTE — Telephone Encounter (Signed)
Spoke with Edison Simon PA-C and he said to let the patient know that the symptoms were not too worrisome. If she does not pass gas or have a bowel movement in 24-48 hours, starts to vomit, or has worsening abdominal pain/distension then she will need to call us or go to the ER.  Patient notified of instructions and will call or report to the ER if symptoms worsen or change.  She will follow up on Tuesday with Dr Hampton Abbot.

## 2019-06-04 ENCOUNTER — Encounter: Payer: Self-pay | Admitting: Surgery

## 2019-06-04 ENCOUNTER — Other Ambulatory Visit: Payer: Self-pay

## 2019-06-04 ENCOUNTER — Ambulatory Visit (INDEPENDENT_AMBULATORY_CARE_PROVIDER_SITE_OTHER): Payer: BC Managed Care – PPO | Admitting: Surgery

## 2019-06-04 VITALS — BP 143/82 | HR 82 | Temp 94.6°F | Ht 69.0 in | Wt 223.4 lb

## 2019-06-04 DIAGNOSIS — R1011 Right upper quadrant pain: Secondary | ICD-10-CM | POA: Diagnosis not present

## 2019-06-04 NOTE — Progress Notes (Signed)
06/04/2019  History of Present Illness: Linda Bradford is a 63 y.o. female presenting with newer onset right upper quadrant pain.  Patient status post laparoscopic sigmoidectomy with end-to-end anastomosis on 09/03/2018 and had a colonoscopy done on 03/08/2019 which noted possible stenosis.  She has been doing well taking MiraLAX daily without any constipation.  She did have a CT scan on 9/18 with moderate stool burden but the anastomosis appears intact.  Patient reports that she has had episodes of right upper quadrant pain going from right upper quadrant and epigastric area also radiating to the back associated with nausea but no vomiting.  This is happened a few times for the last couple of weeks after eating.  She denies having any greasy foods but still reports having this pain.  The pain is not constant and has only happened a few times.  Denies any fevers or chills.  Denies any pain in the left abdomen.  Past Medical History: Past Medical History:  Diagnosis Date  . Anxiety   . Arthritis   . Cancer ([place])    skin cancer, removed  . COPD (chronic obstructive pulmonary disease) ([place])   . Depression   . Diabetes mellitus without complication ([place])   . Dysrhythmia    occasional pvc's. not being treated.  . Fatigue   . GERD (gastroesophageal reflux disease)   . Headache   . Hypertension   . Hypothyroidism   . Thyroid disease      Past Surgical History: Past Surgical History:  Procedure Laterality Date  . APPENDECTOMY    . COLECTOMY    . COLECTOMY WITH COLOSTOMY CREATION/HARTMANN PROCEDURE N/A 09/03/2018   Procedure: POSSIBLE COLOSTOMY;  Surgeon: Olean Ree, MD;  Location: [facility] ORS;  Service: General;  Laterality: N/A;  . COLONOSCOPY WITH PROPOFOL N/A 03/08/2019   Procedure: COLONOSCOPY WITH PROPOFOL;  Surgeon: Virgel Manifold, MD;  Location: [facility];  Service: Endoscopy;  Laterality: N/A;  . CYSTOSCOPY WITH STENT PLACEMENT Bilateral 09/03/2018   Procedure: CYSTOSCOPY WITH STENT  PLACEMENT-LIGHTED STENTS;  Surgeon: Hollice Espy, MD;  Location: [facility] ORS;  Service: Urology;  Laterality: Bilateral;  . LAPAROSCOPIC SIGMOID COLECTOMY N/A 09/03/2018   Procedure: LAPAROSCOPIC SIGMOID COLECTOMY;  Surgeon: Olean Ree, MD;  Location: [facility] ORS;  Service: General;  Laterality: N/A;  Will need bilateral lighted ureteral stents to be placed by Urology at the beginning of case.  Will also need Dr. Genevive Bi to assist.  . percutaneous drainage tube  07/2018   2nd tube placed. not healing in colon, causing a fistula  . TUBAL LIGATION      Home Medications: Prior to Admission medications   Medication Sig Start Date End Date Taking? Authorizing Provider  acetaminophen (TYLENOL) 500 MG tablet Take 1,000 mg by mouth every 6 (six) hours as needed for mild pain or moderate pain.    Yes [provider]  clonazePAM (KLONOPIN) 0.5 MG tablet TAKE 1/2 TABLET BY MOUTH TWICE DAILY AS NEEDED Patient taking differently: Take 0.25 mg by mouth 2 (two) times daily as needed for anxiety.  03/19/19  Yes Mar Daring, PA-C  diclofenac sodium (VOLTAREN) 1 % GEL Apply 4 g topically 4 (four) times daily as needed. Patient taking differently: Apply 4 g topically 4 (four) times daily as needed (pain). Apply to knees 03/01/18  Yes Burnette, Clearnce Sorrel, PA-C  DULoxetine (CYMBALTA) 60 MG capsule TAKE 1 CAPSULE BY MOUTH TWICE DAILY Patient taking differently: Take 60 mg by mouth 2 (two) times daily.  04/01/19  Yes Burnette,  Clearnce Sorrel, PA-C  ergocalciferol (VITAMIN D2) 1.25 MG (50000 UT) capsule Take 1 capsule (50,000 Units total) by mouth once a week. Once a week on Wednesday Patient taking differently: Take 50,000 Units by mouth every Wednesday.  03/14/19  Yes Fenton Malling M, PA-C  ezetimibe (ZETIA) 10 MG tablet TAKE 1 TABLET DAILY 05/20/19  Yes Fenton Malling M, PA-C  fluticasone (FLONASE) 50 MCG/ACT nasal spray USE 2 SPRAYS IN EACH NOSTRIL ONCE DAILY Patient taking differently: Place 2  sprays into both nostrils daily as needed for allergies or rhinitis.  08/07/18  Yes Fenton Malling M, PA-C  fluticasone furoate-vilanterol (BREO ELLIPTA) 200-25 MCG/INH AEPB Inhale 1 puff into the lungs daily. 10/22/18  Yes Mar Daring, PA-C  gabapentin (NEURONTIN) 300 MG capsule TAKE 3 CAPSULES BY MOUTH ONCE EVERY MORNING AND 4 CAPSULES AT BEDTIME 06/04/19  Yes Mar Daring, PA-C  glucose blood (CONTOUR NEXT TEST) test strip To check blood sugar once daily 06/09/17  Yes Burnette, Jennifer M, PA-C  ipratropium-albuterol (DUONEB) 0.5-2.5 (3) MG/3ML SOLN Take 3 mLs by nebulization every 6 (six) hours as needed. Patient taking differently: Take 3 mLs by nebulization every 6 (six) hours as needed (wheezing).  11/30/18  Yes Mar Daring, PA-C  levothyroxine (SYNTHROID) 75 MCG tablet TAKE 1 TABLET DAILY ON AN EMPTY STOMACH, WAIT 30 MINUTES BEFORE TAKING OTHER MEDICATIONS 05/20/19  Yes Mar Daring, PA-C  meclizine (ANTIVERT) 25 MG tablet Take 1 tablet (25 mg total) by mouth 3 (three) times daily as needed for dizziness. 05/15/18  Yes Mar Daring, PA-C  meloxicam (MOBIC) 15 MG tablet TAKE 1 TABLET BY MOUTH ONCE DAILY 05/01/19  Yes Fenton Malling M, PA-C  metFORMIN (GLUCOPHAGE-XR) 500 MG 24 hr tablet TAKE 2 TABLETS BY MOUTH ONCE DAILY WITH SUPPER Patient taking differently: Take 1,000 mg by mouth daily with supper.  02/28/19  Yes Mar Daring, PA-C  metoprolol succinate (TOPROL-XL) 50 MG 24 hr tablet Take 1 tablet (50 mg total) by mouth daily. Take with or immediately following a meal. 05/08/19  Yes Burnette, Clearnce Sorrel, PA-C  nitrofurantoin, macrocrystal-monohydrate, (MACROBID) 100 MG capsule Take 1 capsule (100 mg total) by mouth at bedtime. 05/01/19  Yes Fenton Malling M, PA-C  nystatin (MYCOSTATIN) 100000 UNIT/ML suspension Take 5 mLs (500,000 Units total) by mouth 4 (four) times daily. 05/01/19  Yes Mar Daring, PA-C  ondansetron (ZOFRAN) 4 MG  tablet TAKE 1 TABLET BY MOUTH EVERY 8 HOURS AS NEEDED 05/22/19  Yes Mar Daring, PA-C  phenazopyridine (PYRIDIUM) 100 MG tablet Take 1 tablet (100 mg total) by mouth 3 (three) times daily as needed for pain. 04/16/19  Yes Fenton Malling M, PA-C  polyethylene glycol (MIRALAX / GLYCOLAX) packet Take 17 g by mouth daily. 06/02/18  Yes Tylene Fantasia, PA-C  PROAIR HFA 108 [phone] Base) MCG/ACT inhaler INHALE 1 PUFF BY MOUTH EVERY 4 HOURS AS NEEDED 05/03/19  Yes Fenton Malling M, PA-C  pyridOXINE (VITAMIN B-6) 100 MG tablet Take 100 mg by mouth daily.   Yes [provider]  THEO-24 300 MG 24 hr capsule TAKE 1 CAPSULE BY MOUTH ONCE DAILY Patient taking differently: Take 300 mg by mouth daily.  12/21/18  Yes Mar Daring, PA-C  tiZANidine (ZANAFLEX) 4 MG tablet TAKE 1 TABLET AT BEDTIME AS NEEDED FOR MUSCLE SPASMS 05/24/19  Yes Mar Daring, PA-C  vitamin B-12 (CYANOCOBALAMIN) 1000 MCG tablet Take 1,000 mcg by mouth daily.   Yes [provider]  gabapentin (NEURONTIN)  300 MG capsule TAKE 3 CAPSULES BY MOUTH ONCE EVERY MORNING AND 4 CAPSULES AT BEDTIME Patient taking differently: Take 900-1,200 mg by mouth See admin instructions. 900 mg every morning and 1200 mg at bedtime 01/28/19   Mar Daring, PA-C    Allergies: Allergies  Allergen Reactions  . Dexilant [Dexlansoprazole] Nausea Only and Other (See Comments)    Dizziness    Review of Systems: Review of Systems  Constitutional: Negative for chills and fever.  Respiratory: Negative for shortness of breath.   Cardiovascular: Negative for chest pain.  Gastrointestinal: Positive for abdominal pain and nausea. Negative for constipation, diarrhea and vomiting.    Physical Exam BP (!) 143/82   Pulse 82   Temp (!) 94.6 F (34.8 C) (Temporal)   Ht 5\' 9"  (1.753 m)   Wt 223 lb 6.4 oz (101.3 kg)   SpO2 96%   BMI 32.99 kg/m  CONSTITUTIONAL: No acute distress HEENT:  Normocephalic, atraumatic,  extraocular motion intact. RESPIRATORY:  Lungs are clear, and breath sounds are equal bilaterally. Normal respiratory effort without pathologic use of accessory muscles. CARDIOVASCULAR: Heart is regular without murmurs, gallops, or rubs. GI: The abdomen is soft, obese, nondistended, with mild tenderness to palpation in the epigastric and right upper quadrant areas.  Negative Murphy's sign.  Prior surgical scars are well-healed. NEUROLOGIC:  Motor and sensation is grossly normal.  Cranial nerves are grossly intact. PSYCH:  Alert and oriented to person, place and time. Affect is normal.  Labs/Imaging: None recently  Assessment and Plan: This is a 63 y.o. female with right upper quadrant pain associated with nausea.  -Discussed with her that given her symptoms and description, she could be having issues with biliary colic.  We will order an ultrasound of the right upper quadrant to evaluate further.  If the ultrasound is negative for cholelithiasis, then we will order a HIDA scan to evaluate for possible biliary dyskinesia.  However this sounds more like symptomatic cholelithiasis instead.  We will have her follow-up with [place] after the ultrasound is done so we can discuss possible surgical plans for cholecystectomy.  Face-to-face time spent with the patient and care providers was 25 minutes, with more than 50% of the time spent counseling, educating, and coordinating care of the patient.     Melvyn Neth, [facility]

## 2019-06-04 NOTE — Patient Instructions (Addendum)
Patient is to try and keep a low fat diet.  Patient is scheduled for ultrasound on 06/10/19 at 8:30am at [facility] at the [place] arrival time is 8:15am. Patient is not to have anything to eat or drink after midnight.   Patient will come in for a follow up appointment with Dr.Piscoya on 06/11/19 at 1:45pm.

## 2019-06-10 ENCOUNTER — Ambulatory Visit
Admission: RE | Admit: 2019-06-10 | Discharge: 2019-06-10 | Disposition: A | Discharge status: Home / Self Care | Payer: BC Managed Care – PPO | Source: Ambulatory Visit | Attending: Surgery | Admitting: Surgery

## 2019-06-10 ENCOUNTER — Telehealth: Payer: Self-pay

## 2019-06-10 ENCOUNTER — Other Ambulatory Visit: Payer: Self-pay

## 2019-06-10 DIAGNOSIS — R1011 Right upper quadrant pain: Secondary | ICD-10-CM

## 2019-06-10 DIAGNOSIS — M25511 Pain in right shoulder: Secondary | ICD-10-CM | POA: Diagnosis not present

## 2019-06-10 DIAGNOSIS — K760 Fatty (change of) liver, not elsewhere classified: Secondary | ICD-10-CM | POA: Diagnosis not present

## 2019-06-10 NOTE — Telephone Encounter (Signed)
-----   Message from Olean Ree, MD sent at 06/10/2019  8:56 AM EST ----- Regarding: reschedule appointment, new orders Hi Linda Bradford,  We saw this patient together last week for RUQ abdominal pain.  U/S was just done this morning and it's negative for gallstones.  Can you please order a HIDA scan with EF for her?  Can you also please let her know the results and the current plan to order HIDA, and that we would postpone her appointment tomorrow until after the HIDA is done so we can talk more about all the results together.  Thanks!  [employer]

## 2019-06-10 NOTE — Telephone Encounter (Signed)
Per Dr.Piscoya advised to notify patient of recent negative results for gallstones on U/S and recommended patient to be scheduled for HIDA scan with EF and rescheduled for follow up appointment. Patient is scheduled for HIDA scan at [facility] at the [place] arrival time is 8:00a and scan at 8:30am. Instructed nothing to eat or drink after midnight. Patient is scheduled for follow up appointment with Dr.Piscoya on 06/19/19 at 10:45a. Patient verbalized understanding.

## 2019-06-11 ENCOUNTER — Ambulatory Visit: Payer: Self-pay | Admitting: Surgery

## 2019-06-17 DIAGNOSIS — M25511 Pain in right shoulder: Secondary | ICD-10-CM | POA: Diagnosis not present

## 2019-06-18 ENCOUNTER — Other Ambulatory Visit: Payer: Self-pay

## 2019-06-18 ENCOUNTER — Ambulatory Visit
Admission: RE | Admit: 2019-06-18 | Discharge: 2019-06-18 | Disposition: A | Discharge status: Home / Self Care | Payer: BC Managed Care – PPO | Source: Ambulatory Visit | Attending: Surgery | Admitting: Surgery

## 2019-06-18 DIAGNOSIS — R1011 Right upper quadrant pain: Secondary | ICD-10-CM | POA: Diagnosis not present

## 2019-06-18 MED ORDER — TECHNETIUM TC 99M MEBROFENIN IV KIT
5.0000 | PACK | Freq: Once | INTRAVENOUS | Status: AC | PRN
  Administered 2019-06-18: 5.51 via INTRAVENOUS

## 2019-06-19 ENCOUNTER — Other Ambulatory Visit: Payer: Self-pay

## 2019-06-19 ENCOUNTER — Ambulatory Visit (INDEPENDENT_AMBULATORY_CARE_PROVIDER_SITE_OTHER): Payer: BC Managed Care – PPO | Admitting: Surgery

## 2019-06-19 ENCOUNTER — Encounter: Payer: Self-pay | Admitting: Surgery

## 2019-06-19 VITALS — BP 153/85 | HR 88 | Temp 97.9°F | Ht 71.0 in | Wt 223.0 lb

## 2019-06-19 DIAGNOSIS — K828 Other specified diseases of gallbladder: Secondary | ICD-10-CM | POA: Diagnosis not present

## 2019-06-19 DIAGNOSIS — R1011 Right upper quadrant pain: Secondary | ICD-10-CM | POA: Diagnosis not present

## 2019-06-19 NOTE — Patient Instructions (Signed)

## 2019-06-19 NOTE — H&P (View-Only) (Signed)
06/19/2019  History of Present Illness: Linda Bradford is a 63 y.o. female presenting today for follow up on her abdominal RUQ pain.  She had an ultrasound on 11/9 which did not show any cholelithiasis or evidence of cholecystitis.  I ordered a HIDA scan to follow this and this was done on 11/17.  This showed a patent CBD and cystic duct, but showed EF of 29%.  The patient reports that she felt bloated and nauseous with administration of Ensure during the HIDA scan.  She reports she continues having RUQ pain, but mostly her symptoms are nausea and bloatedness / belching.  She also has intermittent issues with heartburn, but reports these symptoms are newer since about a month or so ago, and her heartburn has never been associated with these symptoms in the past.  Past Medical History: Past Medical History:  Diagnosis Date  . Anxiety   . Arthritis   . Cancer ([place])    skin cancer, removed  . COPD (chronic obstructive pulmonary disease) ([place])   . Depression   . Diabetes mellitus without complication ([place])   . Dysrhythmia    occasional pvc's. not being treated.  . Fatigue   . GERD (gastroesophageal reflux disease)   . Headache   . Hypertension   . Hypothyroidism   . Thyroid disease      Past Surgical History: Past Surgical History:  Procedure Laterality Date  . APPENDECTOMY    . COLECTOMY WITH COLOSTOMY CREATION/HARTMANN PROCEDURE N/A 09/03/2018   Error, this was not done.  . COLONOSCOPY WITH PROPOFOL N/A 03/08/2019   Procedure: COLONOSCOPY WITH PROPOFOL;  Surgeon: Virgel Manifold, MD;  Location: [facility] END[facility];  Service: Endoscopy;  Laterality: N/A;  . CYSTOSCOPY WITH STENT PLACEMENT Bilateral 09/03/2018   Procedure: CYSTOSCOPY WITH STENT PLACEMENT-LIGHTED STENTS;  Surgeon: Hollice Espy, MD;  Location: [facility] ORS;  Service: Urology;  Laterality: Bilateral;  . LAPAROSCOPIC SIGMOID COLECTOMY N/A 09/03/2018   Procedure: LAPAROSCOPIC SIGMOID COLECTOMY;  Surgeon: Olean Ree, MD;   Location: [facility] ORS;  Service: General;  Laterality: N/A;  . percutaneous drainage tube  07/2018   2nd tube placed. not healing in colon, causing a fistula  . TUBAL LIGATION      Home Medications: Prior to Admission medications   Medication Sig Start Date End Date Taking? Authorizing Provider  acetaminophen (TYLENOL) 500 MG tablet Take 1,000 mg by mouth every 6 (six) hours as needed for mild pain or moderate pain.    Yes [provider]  clonazePAM (KLONOPIN) 0.5 MG tablet TAKE 1/2 TABLET BY MOUTH TWICE DAILY AS NEEDED Patient taking differently: Take 0.25 mg by mouth 2 (two) times daily as needed for anxiety.  03/19/19  Yes Mar Daring, PA-C  diclofenac sodium (VOLTAREN) 1 % GEL Apply 4 g topically 4 (four) times daily as needed. Patient taking differently: Apply 4 g topically 4 (four) times daily as needed (pain). Apply to knees 03/01/18  Yes Burnette, Clearnce Sorrel, PA-C  DULoxetine (CYMBALTA) 60 MG capsule TAKE 1 CAPSULE BY MOUTH TWICE DAILY Patient taking differently: Take 60 mg by mouth 2 (two) times daily.  04/01/19  Yes Mar Daring, PA-C  ergocalciferol (VITAMIN D2) 1.25 MG (50000 UT) capsule Take 1 capsule (50,000 Units total) by mouth once a week. Once a week on Wednesday Patient taking differently: Take 50,000 Units by mouth every Wednesday.  03/14/19  Yes Mar Daring, PA-C  ezetimibe (ZETIA) 10 MG tablet TAKE 1 TABLET DAILY 05/20/19  Yes Mar Daring, PA-C  fluticasone (FLONASE) 50 MCG/ACT nasal spray USE 2 SPRAYS IN EACH NOSTRIL ONCE DAILY Patient taking differently: Place 2 sprays into both nostrils daily as needed for allergies or rhinitis.  08/07/18  Yes Fenton Malling M, PA-C  fluticasone furoate-vilanterol (BREO ELLIPTA) 200-25 MCG/INH AEPB Inhale 1 puff into the lungs daily. 10/22/18  Yes Mar Daring, PA-C  gabapentin (NEURONTIN) 300 MG capsule TAKE 3 CAPSULES BY MOUTH ONCE EVERY MORNING AND 4 CAPSULES AT BEDTIME 06/04/19  Yes  Mar Daring, PA-C  glucose blood (CONTOUR NEXT TEST) test strip To check blood sugar once daily 06/09/17  Yes Burnette, Jennifer M, PA-C  ipratropium-albuterol (DUONEB) 0.5-2.5 (3) MG/3ML SOLN Take 3 mLs by nebulization every 6 (six) hours as needed. Patient taking differently: Take 3 mLs by nebulization every 6 (six) hours as needed (wheezing).  11/30/18  Yes Mar Daring, PA-C  levothyroxine (SYNTHROID) 75 MCG tablet TAKE 1 TABLET DAILY ON AN EMPTY STOMACH, WAIT 30 MINUTES BEFORE TAKING OTHER MEDICATIONS 05/20/19  Yes Mar Daring, PA-C  meclizine (ANTIVERT) 25 MG tablet Take 1 tablet (25 mg total) by mouth 3 (three) times daily as needed for dizziness. 05/15/18  Yes Mar Daring, PA-C  meloxicam (MOBIC) 15 MG tablet TAKE 1 TABLET BY MOUTH ONCE DAILY 05/01/19  Yes Fenton Malling M, PA-C  metFORMIN (GLUCOPHAGE-XR) 500 MG 24 hr tablet TAKE 2 TABLETS BY MOUTH ONCE DAILY WITH SUPPER Patient taking differently: Take 1,000 mg by mouth daily with supper.  02/28/19  Yes Mar Daring, PA-C  metoprolol succinate (TOPROL-XL) 50 MG 24 hr tablet Take 1 tablet (50 mg total) by mouth daily. Take with or immediately following a meal. 05/08/19  Yes Burnette, Clearnce Sorrel, PA-C  nitrofurantoin, macrocrystal-monohydrate, (MACROBID) 100 MG capsule Take 1 capsule (100 mg total) by mouth at bedtime. 05/01/19  Yes Fenton Malling M, PA-C  nystatin (MYCOSTATIN) 100000 UNIT/ML suspension Take 5 mLs (500,000 Units total) by mouth 4 (four) times daily. 05/01/19  Yes Mar Daring, PA-C  ondansetron (ZOFRAN) 4 MG tablet TAKE 1 TABLET BY MOUTH EVERY 8 HOURS AS NEEDED 05/22/19  Yes Mar Daring, PA-C  phenazopyridine (PYRIDIUM) 100 MG tablet Take 1 tablet (100 mg total) by mouth 3 (three) times daily as needed for pain. 04/16/19  Yes Fenton Malling M, PA-C  polyethylene glycol (MIRALAX / GLYCOLAX) packet Take 17 g by mouth daily. 06/02/18  Yes Tylene Fantasia, PA-C  PROAIR  HFA 108 [phone] Base) MCG/ACT inhaler INHALE 1 PUFF BY MOUTH EVERY 4 HOURS AS NEEDED 05/03/19  Yes Fenton Malling M, PA-C  pyridOXINE (VITAMIN B-6) 100 MG tablet Take 100 mg by mouth daily.   Yes [provider]  THEO-24 300 MG 24 hr capsule TAKE 1 CAPSULE BY MOUTH ONCE DAILY Patient taking differently: Take 300 mg by mouth daily.  12/21/18  Yes Mar Daring, PA-C  tiZANidine (ZANAFLEX) 4 MG tablet TAKE 1 TABLET AT BEDTIME AS NEEDED FOR MUSCLE SPASMS 05/24/19  Yes Mar Daring, PA-C  vitamin B-12 (CYANOCOBALAMIN) 1000 MCG tablet Take 1,000 mcg by mouth daily.   Yes [provider]  gabapentin (NEURONTIN) 300 MG capsule TAKE 3 CAPSULES BY MOUTH ONCE EVERY MORNING AND 4 CAPSULES AT BEDTIME Patient taking differently: Take 900-1,200 mg by mouth See admin instructions. 900 mg every morning and 1200 mg at bedtime 01/28/19   Mar Daring, PA-C    Allergies: Allergies  Allergen Reactions  . Dexilant [Dexlansoprazole] Nausea Only and Other (See Comments)  Dizziness    Review of Systems: Review of Systems  Constitutional: Negative for chills and fever.  Respiratory: Negative for shortness of breath.   Cardiovascular: Negative for chest pain.  Gastrointestinal: Positive for abdominal pain, heartburn and nausea. Negative for vomiting.  Skin: Negative for rash.    Physical Exam BP (!) 153/85   Pulse 88   Temp 97.9 F (36.6 C) (Skin)   Ht 5\' 11"  (1.803 m)   Wt 223 lb (101.2 kg)   BMI 31.10 kg/m  CONSTITUTIONAL: No acute distress HEENT:  Normocephalic, atraumatic, extraocular motion intact. RESPIRATORY:  Lungs are clear, and breath sounds are equal bilaterally. Normal respiratory effort without pathologic use of accessory muscles. CARDIOVASCULAR: Heart is regular without murmurs, gallops, or rubs. GI: The abdomen is soft, obese, non-distended, with some discomfort to palpation in the epigastric and right upper quadrant areas.  Negative Murphy's sign.  There were no palpable masses.  Prior surgical scars well healed. NEUROLOGIC:  Motor and sensation is grossly normal.  Cranial nerves are grossly intact. PSYCH:  Alert and oriented to person, place and time. Affect is normal.  Labs/Imaging: U/S 06/10/19 IMPRESSION: Fatty liver, otherwise unremarkable right upper quadrant ultrasound.  HIDA Scan 06/18/19 IMPRESSION: Patent biliary tree.  Slightly decreased gallbladder ejection fraction of 29% following fatty meal stimulation.   Assessment and Plan: This is a 63 y.o. female with RUQ pain and biliary dyskinesia on HIDA scan.  --Discussed with the patient the results of her studies.  I have independently viewed the images and agree with the findings.  After asking further, her symptoms do not seem to be related at all to heartburn and these are new symptoms to her rather than similar to her heartburn symptoms in the past.  At this point, I think it's reasonable to offer cholecystectomy for her. --Discussed with her the role for laparoscopic cholecystectomy, including risks of bleeding, infection, and injury to surrounding structures.  She's willing to proceed.  Discussed with her that this would be an outpatient surgery.  She will be schedule for 07/01/19.  She understands she would need testing for COVID prior to surgery.  Face-to-face time spent with the patient and care providers was 25 minutes, with more than 50% of the time spent counseling, educating, and coordinating care of the patient.     Melvyn Neth, [facility]

## 2019-06-19 NOTE — Progress Notes (Signed)
06/19/2019  History of Present Illness: Linda Bradford is a 63 y.o. female presenting today for follow up on her abdominal RUQ pain.  She had an ultrasound on 11/9 which did not show any cholelithiasis or evidence of cholecystitis.  I ordered a HIDA scan to follow this and this was done on 11/17.  This showed a patent CBD and cystic duct, but showed EF of 29%.  The patient reports that she felt bloated and nauseous with administration of Ensure during the HIDA scan.  She reports she continues having RUQ pain, but mostly her symptoms are nausea and bloatedness / belching.  She also has intermittent issues with heartburn, but reports these symptoms are newer since about a month or so ago, and her heartburn has never been associated with these symptoms in the past.  Past Medical History: Past Medical History:  Diagnosis Date  . Anxiety   . Arthritis   . Cancer ([place])    skin cancer, removed  . COPD (chronic obstructive pulmonary disease) ([place])   . Depression   . Diabetes mellitus without complication ([place])   . Dysrhythmia    occasional pvc's. not being treated.  . Fatigue   . GERD (gastroesophageal reflux disease)   . Headache   . Hypertension   . Hypothyroidism   . Thyroid disease      Past Surgical History: Past Surgical History:  Procedure Laterality Date  . APPENDECTOMY    . COLECTOMY WITH COLOSTOMY CREATION/HARTMANN PROCEDURE N/A 09/03/2018   Error, this was not done.  . COLONOSCOPY WITH PROPOFOL N/A 03/08/2019   Procedure: COLONOSCOPY WITH PROPOFOL;  Surgeon: Virgel Manifold, MD;  Location: [facility] ENDOSCOPY;  Service: Endoscopy;  Laterality: N/A;  . CYSTOSCOPY WITH STENT PLACEMENT Bilateral 09/03/2018   Procedure: CYSTOSCOPY WITH STENT PLACEMENT-LIGHTED STENTS;  Surgeon: Hollice Espy, MD;  Location: [facility] ORS;  Service: Urology;  Laterality: Bilateral;  . LAPAROSCOPIC SIGMOID COLECTOMY N/A 09/03/2018   Procedure: LAPAROSCOPIC SIGMOID COLECTOMY;  Surgeon: Olean Ree, MD;   Location: [facility] ORS;  Service: General;  Laterality: N/A;  . percutaneous drainage tube  07/2018   2nd tube placed. not healing in colon, causing a fistula  . TUBAL LIGATION      Home Medications: Prior to Admission medications   Medication Sig Start Date End Date Taking? Authorizing Provider  acetaminophen (TYLENOL) 500 MG tablet Take 1,000 mg by mouth every 6 (six) hours as needed for mild pain or moderate pain.    Yes [provider]  clonazePAM (KLONOPIN) 0.5 MG tablet TAKE 1/2 TABLET BY MOUTH TWICE DAILY AS NEEDED Patient taking differently: Take 0.25 mg by mouth 2 (two) times daily as needed for anxiety.  03/19/19  Yes Mar Daring, PA-C  diclofenac sodium (VOLTAREN) 1 % GEL Apply 4 g topically 4 (four) times daily as needed. Patient taking differently: Apply 4 g topically 4 (four) times daily as needed (pain). Apply to knees 03/01/18  Yes Burnette, Clearnce Sorrel, PA-C  DULoxetine (CYMBALTA) 60 MG capsule TAKE 1 CAPSULE BY MOUTH TWICE DAILY Patient taking differently: Take 60 mg by mouth 2 (two) times daily.  04/01/19  Yes Mar Daring, PA-C  ergocalciferol (VITAMIN D2) 1.25 MG (50000 UT) capsule Take 1 capsule (50,000 Units total) by mouth once a week. Once a week on Wednesday Patient taking differently: Take 50,000 Units by mouth every Wednesday.  03/14/19  Yes Mar Daring, PA-C  ezetimibe (ZETIA) 10 MG tablet TAKE 1 TABLET DAILY 05/20/19  Yes Mar Daring, PA-C  fluticasone (FLONASE) 50 MCG/ACT nasal spray USE 2 SPRAYS IN EACH NOSTRIL ONCE DAILY Patient taking differently: Place 2 sprays into both nostrils daily as needed for allergies or rhinitis.  08/07/18  Yes Fenton Malling M, PA-C  fluticasone furoate-vilanterol (BREO ELLIPTA) 200-25 MCG/INH AEPB Inhale 1 puff into the lungs daily. 10/22/18  Yes Mar Daring, PA-C  gabapentin (NEURONTIN) 300 MG capsule TAKE 3 CAPSULES BY MOUTH ONCE EVERY MORNING AND 4 CAPSULES AT BEDTIME 06/04/19  Yes  Mar Daring, PA-C  glucose blood (CONTOUR NEXT TEST) test strip To check blood sugar once daily 06/09/17  Yes Burnette, Jennifer M, PA-C  ipratropium-albuterol (DUONEB) 0.5-2.5 (3) MG/3ML SOLN Take 3 mLs by nebulization every 6 (six) hours as needed. Patient taking differently: Take 3 mLs by nebulization every 6 (six) hours as needed (wheezing).  11/30/18  Yes Mar Daring, PA-C  levothyroxine (SYNTHROID) 75 MCG tablet TAKE 1 TABLET DAILY ON AN EMPTY STOMACH, WAIT 30 MINUTES BEFORE TAKING OTHER MEDICATIONS 05/20/19  Yes Mar Daring, PA-C  meclizine (ANTIVERT) 25 MG tablet Take 1 tablet (25 mg total) by mouth 3 (three) times daily as needed for dizziness. 05/15/18  Yes Mar Daring, PA-C  meloxicam (MOBIC) 15 MG tablet TAKE 1 TABLET BY MOUTH ONCE DAILY 05/01/19  Yes Fenton Malling M, PA-C  metFORMIN (GLUCOPHAGE-XR) 500 MG 24 hr tablet TAKE 2 TABLETS BY MOUTH ONCE DAILY WITH SUPPER Patient taking differently: Take 1,000 mg by mouth daily with supper.  02/28/19  Yes Mar Daring, PA-C  metoprolol succinate (TOPROL-XL) 50 MG 24 hr tablet Take 1 tablet (50 mg total) by mouth daily. Take with or immediately following a meal. 05/08/19  Yes Burnette, Clearnce Sorrel, PA-C  nitrofurantoin, macrocrystal-monohydrate, (MACROBID) 100 MG capsule Take 1 capsule (100 mg total) by mouth at bedtime. 05/01/19  Yes Fenton Malling M, PA-C  nystatin (MYCOSTATIN) 100000 UNIT/ML suspension Take 5 mLs (500,000 Units total) by mouth 4 (four) times daily. 05/01/19  Yes Mar Daring, PA-C  ondansetron (ZOFRAN) 4 MG tablet TAKE 1 TABLET BY MOUTH EVERY 8 HOURS AS NEEDED 05/22/19  Yes Mar Daring, PA-C  phenazopyridine (PYRIDIUM) 100 MG tablet Take 1 tablet (100 mg total) by mouth 3 (three) times daily as needed for pain. 04/16/19  Yes Fenton Malling M, PA-C  polyethylene glycol (MIRALAX / GLYCOLAX) packet Take 17 g by mouth daily. 06/02/18  Yes Tylene Fantasia, PA-C  PROAIR  HFA 108 [phone] Base) MCG/ACT inhaler INHALE 1 PUFF BY MOUTH EVERY 4 HOURS AS NEEDED 05/03/19  Yes Fenton Malling M, PA-C  pyridOXINE (VITAMIN B-6) 100 MG tablet Take 100 mg by mouth daily.   Yes [provider]  THEO-24 300 MG 24 hr capsule TAKE 1 CAPSULE BY MOUTH ONCE DAILY Patient taking differently: Take 300 mg by mouth daily.  12/21/18  Yes Mar Daring, PA-C  tiZANidine (ZANAFLEX) 4 MG tablet TAKE 1 TABLET AT BEDTIME AS NEEDED FOR MUSCLE SPASMS 05/24/19  Yes Mar Daring, PA-C  vitamin B-12 (CYANOCOBALAMIN) 1000 MCG tablet Take 1,000 mcg by mouth daily.   Yes [provider]  gabapentin (NEURONTIN) 300 MG capsule TAKE 3 CAPSULES BY MOUTH ONCE EVERY MORNING AND 4 CAPSULES AT BEDTIME Patient taking differently: Take 900-1,200 mg by mouth See admin instructions. 900 mg every morning and 1200 mg at bedtime 01/28/19   Mar Daring, PA-C    Allergies: Allergies  Allergen Reactions  . Dexilant [Dexlansoprazole] Nausea Only and Other (See Comments)  Dizziness    Review of Systems: Review of Systems  Constitutional: Negative for chills and fever.  Respiratory: Negative for shortness of breath.   Cardiovascular: Negative for chest pain.  Gastrointestinal: Positive for abdominal pain, heartburn and nausea. Negative for vomiting.  Skin: Negative for rash.    Physical Exam BP (!) 153/85   Pulse 88   Temp 97.9 F (36.6 C) (Skin)   Ht 5\' 11"  (1.803 m)   Wt 223 lb (101.2 kg)   BMI 31.10 kg/m  CONSTITUTIONAL: No acute distress HEENT:  Normocephalic, atraumatic, extraocular motion intact. RESPIRATORY:  Lungs are clear, and breath sounds are equal bilaterally. Normal respiratory effort without pathologic use of accessory muscles. CARDIOVASCULAR: Heart is regular without murmurs, gallops, or rubs. GI: The abdomen is soft, obese, non-distended, with some discomfort to palpation in the epigastric and right upper quadrant areas.  Negative Murphy's sign.  There were no palpable masses.  Prior surgical scars well healed. NEUROLOGIC:  Motor and sensation is grossly normal.  Cranial nerves are grossly intact. PSYCH:  Alert and oriented to person, place and time. Affect is normal.  Labs/Imaging: U/S 06/10/19 IMPRESSION: Fatty liver, otherwise unremarkable right upper quadrant ultrasound.  HIDA Scan 06/18/19 IMPRESSION: Patent biliary tree.  Slightly decreased gallbladder ejection fraction of 29% following fatty meal stimulation.   Assessment and Plan: This is a 63 y.o. female with RUQ pain and biliary dyskinesia on HIDA scan.  --Discussed with the patient the results of her studies.  I have independently viewed the images and agree with the findings.  After asking further, her symptoms do not seem to be related at all to heartburn and these are new symptoms to her rather than similar to her heartburn symptoms in the past.  At this point, I think it's reasonable to offer cholecystectomy for her. --Discussed with her the role for laparoscopic cholecystectomy, including risks of bleeding, infection, and injury to surrounding structures.  She's willing to proceed.  Discussed with her that this would be an outpatient surgery.  She will be schedule for 07/01/19.  She understands she would need testing for COVID prior to surgery.  Face-to-face time spent with the patient and care providers was 25 minutes, with more than 50% of the time spent counseling, educating, and coordinating care of the patient.     Melvyn Neth, [facility]

## 2019-06-20 ENCOUNTER — Telehealth: Payer: Self-pay | Admitting: Surgery

## 2019-06-20 NOTE — Telephone Encounter (Signed)
Surgery information provided. Patient verbalized understanding.

## 2019-06-20 NOTE — Telephone Encounter (Signed)
Please call patient and advise her of the information below.   Surgery Date: 07/01/19 with Dr Audry Riles cholecystectomy. Preadmission Testing Date: 06/26/19 @ 2:30pm-please advise patient to arrive at the [facility] and she will be escorted to the preadmission office.  Covid Testing Date: 06/28/19 between 8-12:00pm - patient advised to go to the [place] [facility]  [employer] Video sent via [employer] [facility] and [employer].  Please make patient aware to call [phone], between 1-3:00pm the day before surgery, to find out what time to arrive.

## 2019-06-24 DIAGNOSIS — M25511 Pain in right shoulder: Secondary | ICD-10-CM | POA: Diagnosis not present

## 2019-06-25 ENCOUNTER — Other Ambulatory Visit: Payer: Self-pay

## 2019-06-25 ENCOUNTER — Encounter
Admission: RE | Admit: 2019-06-25 | Discharge: 2019-06-25 | Disposition: A | Discharge status: Home / Self Care | Payer: BC Managed Care – PPO | Source: Ambulatory Visit | Attending: Surgery | Admitting: Surgery

## 2019-06-25 DIAGNOSIS — K828 Other specified diseases of gallbladder: Secondary | ICD-10-CM | POA: Diagnosis not present

## 2019-06-25 DIAGNOSIS — Z01810 Encounter for preprocedural cardiovascular examination: Secondary | ICD-10-CM | POA: Insufficient documentation

## 2019-06-25 DIAGNOSIS — I10 Essential (primary) hypertension: Secondary | ICD-10-CM | POA: Insufficient documentation

## 2019-06-25 NOTE — Patient Instructions (Signed)
Your procedure is scheduled on: July 01, 2019 Monday  Report to Day Surgery on the 2nd floor of the [facility]. To find out your arrival time, please call [phone] between 1PM - 3PM on: FRIDAY June 28, 2019  REMEMBER: Instructions that are not followed completely may result in serious medical risk, up to and including death; or upon the discretion of your surgeon and anesthesiologist your surgery may need to be rescheduled.  Do not eat food after midnight the night before surgery.  No gum chewing, lozengers or hard candies.  You may however, drink CLEAR liquids up to 2 hours before you are scheduled to arrive for your surgery. Do not drink anything within 2 hours of the start of your surgery.  Clear liquids include: - water   Do NOT drink anything that is not on this list.  Type 1 and Type 2 diabetics should only drink water.   No Alcohol for 24 hours before or after surgery.  No Smoking including e-cigarettes for 24 hours prior to surgery.  No chewable tobacco products for at least 6 hours prior to surgery.  No nicotine patches on the day of surgery.  On the morning of surgery brush your teeth with toothpaste and water, you may rinse your mouth with mouthwash if you wish. Do not swallow any toothpaste or mouthwash.  Notify your doctor if there is any change in your medical condition (cold, fever, infection).  Do not wear jewelry, make-up, hairpins, clips or nail polish.  Do not wear lotions, powders, or perfumes OR DEODORANT   Do not shave 48 hours prior to surgery.   Contacts and dentures may not be worn into surgery.  Do not bring valuables to the hospital, including drivers license, insurance or credit cards.  [facility] is not responsible for any belongings or valuables.   TAKE THESE MEDICATIONS THE MORNING OF SURGERY: THEO SYNTHROID TOPROL CYMBALTA ZETIA  Use CHG Soap  as directed on instruction sheet.  Fleets enema or Magnesium Citrate as  directed.  Use inhalers on the day of surgery.   Stop Metformin  2 days prior to surgery. LAST DOSE 2 DAYS.  Stop Anti-inflammatories (NSAIDS) such as Advil, Aleve, Ibuprofen, Motrin, Naproxen, Naprosyn and Aspirin based products such as Excedrin, Goodys Powder, BC Powder.MELOXICAM AS OF TODAY. (May take Tylenol or Acetaminophen if needed.)  Stop ANY OVER THE COUNTER supplements until after surgery. (May continue Vitamin D, Vitamin B, and multivitamin.)  Wear comfortable clothing (specific to your surgery type) to the hospital.  Plan for stool softeners for home use.  If you are being discharged the day of surgery, you will not be allowed to drive home. You will need a responsible adult to drive you home and stay with you that night.   If you are taking public transportation, you will need to have a responsible adult with you. Please confirm with your physician that it is acceptable to use public transportation.   Please call [phone] if you have any questions about these instructions.

## 2019-06-26 DIAGNOSIS — M25511 Pain in right shoulder: Secondary | ICD-10-CM | POA: Diagnosis not present

## 2019-06-28 ENCOUNTER — Other Ambulatory Visit: Payer: Self-pay

## 2019-06-28 ENCOUNTER — Other Ambulatory Visit
Admission: RE | Admit: 2019-06-28 | Discharge: 2019-06-28 | Disposition: A | Discharge status: Home / Self Care | Payer: BC Managed Care – PPO | Source: Ambulatory Visit | Attending: Surgery | Admitting: Surgery

## 2019-06-28 DIAGNOSIS — Z20828 Contact with and (suspected) exposure to other viral communicable diseases: Secondary | ICD-10-CM | POA: Diagnosis not present

## 2019-06-28 DIAGNOSIS — Z01812 Encounter for preprocedural laboratory examination: Secondary | ICD-10-CM | POA: Insufficient documentation

## 2019-06-29 LAB — SARS CORONAVIRUS 2 (TAT 6-24 HRS): SARS Coronavirus 2: NEGATIVE

## 2019-07-01 ENCOUNTER — Encounter
Admission: RE | Disposition: A | Discharge status: Home / Self Care | Payer: Self-pay | Source: Home / Self Care | Attending: Surgery

## 2019-07-01 ENCOUNTER — Encounter: Payer: Self-pay | Admitting: *Deleted

## 2019-07-01 ENCOUNTER — Ambulatory Visit: Payer: BC Managed Care – PPO | Admitting: Certified Registered"

## 2019-07-01 ENCOUNTER — Other Ambulatory Visit: Payer: Self-pay

## 2019-07-01 ENCOUNTER — Ambulatory Visit
Admission: RE | Admit: 2019-07-01 | Discharge: 2019-07-01 | Disposition: A | Discharge status: Home / Self Care | Payer: BC Managed Care – PPO | Attending: Surgery | Admitting: Surgery

## 2019-07-01 DIAGNOSIS — Z888 Allergy status to other drugs, medicaments and biological substances status: Secondary | ICD-10-CM | POA: Diagnosis not present

## 2019-07-01 DIAGNOSIS — F329 Major depressive disorder, single episode, unspecified: Secondary | ICD-10-CM | POA: Insufficient documentation

## 2019-07-01 DIAGNOSIS — I10 Essential (primary) hypertension: Secondary | ICD-10-CM | POA: Insufficient documentation

## 2019-07-01 DIAGNOSIS — M1990 Unspecified osteoarthritis, unspecified site: Secondary | ICD-10-CM | POA: Insufficient documentation

## 2019-07-01 DIAGNOSIS — E039 Hypothyroidism, unspecified: Secondary | ICD-10-CM | POA: Insufficient documentation

## 2019-07-01 DIAGNOSIS — K828 Other specified diseases of gallbladder: Secondary | ICD-10-CM

## 2019-07-01 DIAGNOSIS — E119 Type 2 diabetes mellitus without complications: Secondary | ICD-10-CM | POA: Insufficient documentation

## 2019-07-01 DIAGNOSIS — K811 Chronic cholecystitis: Secondary | ICD-10-CM | POA: Diagnosis not present

## 2019-07-01 DIAGNOSIS — Z7984 Long term (current) use of oral hypoglycemic drugs: Secondary | ICD-10-CM | POA: Diagnosis not present

## 2019-07-01 DIAGNOSIS — K219 Gastro-esophageal reflux disease without esophagitis: Secondary | ICD-10-CM | POA: Insufficient documentation

## 2019-07-01 DIAGNOSIS — Z85828 Personal history of other malignant neoplasm of skin: Secondary | ICD-10-CM | POA: Diagnosis not present

## 2019-07-01 DIAGNOSIS — F17200 Nicotine dependence, unspecified, uncomplicated: Secondary | ICD-10-CM | POA: Diagnosis not present

## 2019-07-01 DIAGNOSIS — Z7951 Long term (current) use of inhaled steroids: Secondary | ICD-10-CM | POA: Insufficient documentation

## 2019-07-01 DIAGNOSIS — Z9049 Acquired absence of other specified parts of digestive tract: Secondary | ICD-10-CM | POA: Insufficient documentation

## 2019-07-01 DIAGNOSIS — Z79899 Other long term (current) drug therapy: Secondary | ICD-10-CM | POA: Diagnosis not present

## 2019-07-01 DIAGNOSIS — F419 Anxiety disorder, unspecified: Secondary | ICD-10-CM | POA: Diagnosis not present

## 2019-07-01 DIAGNOSIS — J449 Chronic obstructive pulmonary disease, unspecified: Secondary | ICD-10-CM | POA: Insufficient documentation

## 2019-07-01 DIAGNOSIS — Z791 Long term (current) use of non-steroidal anti-inflammatories (NSAID): Secondary | ICD-10-CM | POA: Insufficient documentation

## 2019-07-01 DIAGNOSIS — K760 Fatty (change of) liver, not elsewhere classified: Secondary | ICD-10-CM | POA: Insufficient documentation

## 2019-07-01 HISTORY — PX: CHOLECYSTECTOMY: SHX55

## 2019-07-01 LAB — GLUCOSE, CAPILLARY
Glucose-Capillary: 134 mg/dL — ABNORMAL HIGH (ref 70–99)
Glucose-Capillary: 189 mg/dL — ABNORMAL HIGH (ref 70–99)

## 2019-07-01 SURGERY — LAPAROSCOPIC CHOLECYSTECTOMY
Anesthesia: General

## 2019-07-01 MED ORDER — SODIUM CHLORIDE 0.9 % IV SOLN
INTRAVENOUS | Status: DC
  Administered 2019-07-01: 14:00:00 via INTRAVENOUS

## 2019-07-01 MED ORDER — MIDAZOLAM HCL 2 MG/2ML IJ SOLN
INTRAMUSCULAR | Status: AC
  Filled 2019-07-01: qty 2

## 2019-07-01 MED ORDER — ROCURONIUM BROMIDE 100 MG/10ML IV SOLN
INTRAVENOUS | Status: DC | PRN
  Administered 2019-07-01: 50 mg via INTRAVENOUS

## 2019-07-01 MED ORDER — CHLORHEXIDINE GLUCONATE CLOTH 2 % EX PADS: 6.0000 | MEDICATED_PAD | Freq: Once | CUTANEOUS | Status: DC

## 2019-07-01 MED ORDER — PHENYLEPHRINE HCL (PRESSORS) 10 MG/ML IV SOLN
INTRAVENOUS | Status: DC | PRN
  Administered 2019-07-01: 200 ug via INTRAVENOUS

## 2019-07-01 MED ORDER — GABAPENTIN 300 MG PO CAPS
ORAL_CAPSULE | ORAL | Status: AC
  Administered 2019-07-01: 300 mg via ORAL
  Filled 2019-07-01: qty 1

## 2019-07-01 MED ORDER — FAMOTIDINE 20 MG PO TABS
20.0000 mg | ORAL_TABLET | Freq: Once | ORAL | Status: AC
  Administered 2019-07-01: 13:00:00 20 mg via ORAL

## 2019-07-01 MED ORDER — FENTANYL CITRATE (PF) 100 MCG/2ML IJ SOLN
INTRAMUSCULAR | Status: AC
  Administered 2019-07-01: 25 ug via INTRAVENOUS
  Filled 2019-07-01: qty 2

## 2019-07-01 MED ORDER — FENTANYL CITRATE (PF) 100 MCG/2ML IJ SOLN
INTRAMUSCULAR | Status: AC
  Filled 2019-07-01: qty 2

## 2019-07-01 MED ORDER — FAMOTIDINE 20 MG PO TABS
ORAL_TABLET | ORAL | Status: AC
  Administered 2019-07-01: 20 mg via ORAL
  Filled 2019-07-01: qty 1

## 2019-07-01 MED ORDER — GABAPENTIN 300 MG PO CAPS
300.0000 mg | ORAL_CAPSULE | ORAL | Status: AC
  Administered 2019-07-01: 13:00:00 300 mg via ORAL

## 2019-07-01 MED ORDER — ACETAMINOPHEN 500 MG PO TABS
ORAL_TABLET | ORAL | Status: AC
  Administered 2019-07-01: 1000 mg via ORAL
  Filled 2019-07-01: qty 2

## 2019-07-01 MED ORDER — KETOROLAC TROMETHAMINE 30 MG/ML IJ SOLN
INTRAMUSCULAR | Status: DC | PRN
  Administered 2019-07-01: 30 mg via INTRAVENOUS

## 2019-07-01 MED ORDER — BUPIVACAINE-EPINEPHRINE (PF) 0.25% -1:200000 IJ SOLN
INTRAMUSCULAR | Status: DC | PRN
  Administered 2019-07-01: 30 mL

## 2019-07-01 MED ORDER — PROPOFOL 10 MG/ML IV BOLUS
INTRAVENOUS | Status: DC | PRN
  Administered 2019-07-01: 200 mg via INTRAVENOUS
  Administered 2019-07-01: 20 mg via INTRAVENOUS

## 2019-07-01 MED ORDER — SUGAMMADEX SODIUM 500 MG/5ML IV SOLN
INTRAVENOUS | Status: DC | PRN
  Administered 2019-07-01: 210 mg via INTRAVENOUS

## 2019-07-01 MED ORDER — CEFAZOLIN SODIUM-DEXTROSE 2-4 GM/100ML-% IV SOLN
INTRAVENOUS | Status: AC
  Filled 2019-07-01: qty 100

## 2019-07-01 MED ORDER — IBUPROFEN 600 MG PO TABS: 600.0000 mg | ORAL_TABLET | Freq: Three times a day (TID) | ORAL | 0 refills | Status: DC | PRN

## 2019-07-01 MED ORDER — ONDANSETRON HCL 4 MG/2ML IJ SOLN
INTRAMUSCULAR | Status: DC | PRN
  Administered 2019-07-01 (×2): 4 mg via INTRAVENOUS

## 2019-07-01 MED ORDER — FENTANYL CITRATE (PF) 100 MCG/2ML IJ SOLN
INTRAMUSCULAR | Status: DC | PRN
  Administered 2019-07-01 (×2): 50 ug via INTRAVENOUS

## 2019-07-01 MED ORDER — ONDANSETRON HCL 4 MG/2ML IJ SOLN
4.0000 mg | Freq: Once | INTRAMUSCULAR | Status: AC | PRN
  Administered 2019-07-01: 18:00:00 4 mg via INTRAVENOUS

## 2019-07-01 MED ORDER — DEXAMETHASONE SODIUM PHOSPHATE 10 MG/ML IJ SOLN
INTRAMUSCULAR | Status: DC | PRN
  Administered 2019-07-01: 10 mg via INTRAVENOUS

## 2019-07-01 MED ORDER — CEFAZOLIN SODIUM-DEXTROSE 2-4 GM/100ML-% IV SOLN
2.0000 g | INTRAVENOUS | Status: AC
  Administered 2019-07-01: 2 g via INTRAVENOUS

## 2019-07-01 MED ORDER — OXYCODONE HCL 5 MG PO TABS: 5.0000 mg | ORAL_TABLET | ORAL | 0 refills | Status: DC | PRN

## 2019-07-01 MED ORDER — FENTANYL CITRATE (PF) 100 MCG/2ML IJ SOLN
25.0000 ug | INTRAMUSCULAR | Status: DC | PRN
  Administered 2019-07-01 (×5): 25 ug via INTRAVENOUS

## 2019-07-01 MED ORDER — ONDANSETRON HCL 4 MG/2ML IJ SOLN
INTRAMUSCULAR | Status: AC
  Administered 2019-07-01: 4 mg via INTRAVENOUS
  Filled 2019-07-01: qty 2

## 2019-07-01 MED ORDER — GLYCOPYRROLATE 0.2 MG/ML IJ SOLN
INTRAMUSCULAR | Status: DC | PRN
  Administered 2019-07-01: 0.2 mg via INTRAVENOUS

## 2019-07-01 MED ORDER — IPRATROPIUM-ALBUTEROL 0.5-2.5 (3) MG/3ML IN SOLN
3.0000 mL | Freq: Once | RESPIRATORY_TRACT | Status: AC
  Administered 2019-07-01: 18:00:00 3 mL via RESPIRATORY_TRACT

## 2019-07-01 MED ORDER — LACTATED RINGERS IV SOLN
INTRAVENOUS | Status: DC | PRN
  Administered 2019-07-01 (×2): via INTRAVENOUS

## 2019-07-01 MED ORDER — EPHEDRINE SULFATE 50 MG/ML IJ SOLN
INTRAMUSCULAR | Status: DC | PRN
  Administered 2019-07-01 (×2): 5 mg via INTRAVENOUS

## 2019-07-01 MED ORDER — IPRATROPIUM-ALBUTEROL 0.5-2.5 (3) MG/3ML IN SOLN
RESPIRATORY_TRACT | Status: AC
  Administered 2019-07-01: 3 mL via RESPIRATORY_TRACT
  Filled 2019-07-01: qty 3

## 2019-07-01 MED ORDER — LIDOCAINE HCL (CARDIAC) PF 100 MG/5ML IV SOSY
PREFILLED_SYRINGE | INTRAVENOUS | Status: DC | PRN
  Administered 2019-07-01: 100 mg via INTRAVENOUS

## 2019-07-01 MED ORDER — MIDAZOLAM HCL 2 MG/2ML IJ SOLN
INTRAMUSCULAR | Status: DC | PRN
  Administered 2019-07-01: 2 mg via INTRAVENOUS

## 2019-07-01 MED ORDER — SODIUM CHLORIDE 0.9 % IV SOLN
INTRAVENOUS | Status: DC | PRN
  Administered 2019-07-01: 16:00:00 15 ug/min via INTRAVENOUS

## 2019-07-01 MED ORDER — ACETAMINOPHEN 500 MG PO TABS
1000.0000 mg | ORAL_TABLET | ORAL | Status: AC
  Administered 2019-07-01: 13:00:00 1000 mg via ORAL

## 2019-07-01 MED ORDER — DEXMEDETOMIDINE HCL IN NACL 200 MCG/50ML IV SOLN
INTRAVENOUS | Status: DC | PRN
  Administered 2019-07-01: 8 ug via INTRAVENOUS
  Administered 2019-07-01: 4 ug via INTRAVENOUS

## 2019-07-01 SURGICAL SUPPLY — 46 items
APPLIER CLIP 5 13 M/L LIGAMAX5 (MISCELLANEOUS) ×3
BLADE SURG 15 STRL LF DISP TIS (BLADE) ×1 IMPLANT
BLADE SURG 15 STRL SS (BLADE) ×2
CANISTER SUCT 1200ML W/VALVE (MISCELLANEOUS) ×3 IMPLANT
CATH CHOLANGI 4FR 420404F (CATHETERS) IMPLANT
CHLORAPREP W/TINT 26 (MISCELLANEOUS) ×3 IMPLANT
CLIP APPLIE 5 13 M/L LIGAMAX5 (MISCELLANEOUS) ×1 IMPLANT
CONRAY 60ML FOR OR (MISCELLANEOUS) IMPLANT
COVER WAND RF STERILE (DRAPES) ×3 IMPLANT
DERMABOND ADVANCED (GAUZE/BANDAGES/DRESSINGS) ×2
DERMABOND ADVANCED .7 DNX12 (GAUZE/BANDAGES/DRESSINGS) ×1 IMPLANT
DRAPE C-ARM XRAY 36X54 (DRAPES) IMPLANT
ELECT CAUTERY BLADE TIP 2.5 (TIP) ×3
ELECT REM PT RETURN 9FT ADLT (ELECTROSURGICAL) ×3
ELECTRODE CAUTERY BLDE TIP 2.5 (TIP) ×1 IMPLANT
ELECTRODE REM PT RTRN 9FT ADLT (ELECTROSURGICAL) ×1 IMPLANT
GLOVE SURG SYN 7.0 (GLOVE) ×3 IMPLANT
GLOVE SURG SYN 7.5  E (GLOVE) ×2
GLOVE SURG SYN 7.5 E (GLOVE) ×1 IMPLANT
GOWN STRL REUS W/ TWL LRG LVL3 (GOWN DISPOSABLE) ×3 IMPLANT
GOWN STRL REUS W/TWL LRG LVL3 (GOWN DISPOSABLE) ×6
IRRIGATION STRYKERFLOW (MISCELLANEOUS) IMPLANT
IRRIGATOR STRYKERFLOW (MISCELLANEOUS)
IV CATH ANGIO 12GX3 LT BLUE (NEEDLE) ×3 IMPLANT
IV NS 1000ML (IV SOLUTION)
IV NS 1000ML BAXH (IV SOLUTION) IMPLANT
JACKSON PRATT 10 (INSTRUMENTS) IMPLANT
L-HOOK LAP DISP 36CM (ELECTROSURGICAL) ×3
LABEL OR SOLS (LABEL) ×3 IMPLANT
LHOOK LAP DISP 36CM (ELECTROSURGICAL) ×1 IMPLANT
NEEDLE HYPO 22GX1.5 SAFETY (NEEDLE) ×6 IMPLANT
PACK LAP CHOLECYSTECTOMY (MISCELLANEOUS) ×3 IMPLANT
PENCIL ELECTRO HAND CTR (MISCELLANEOUS) ×3 IMPLANT
POUCH SPECIMEN RETRIEVAL 10MM (ENDOMECHANICALS) ×3 IMPLANT
SCISSORS METZENBAUM CVD 33 (INSTRUMENTS) ×3 IMPLANT
SET TUBE SMOKE EVAC HIGH FLOW (TUBING) ×3 IMPLANT
SLEEVE ADV FIXATION 5X100MM (TROCAR) ×9 IMPLANT
SPONGE VERSALON 4X4 4PLY (MISCELLANEOUS) IMPLANT
SUT MNCRL 4-0 (SUTURE) ×2
SUT MNCRL 4-0 27XMFL (SUTURE) ×1
SUT VIC AB 3-0 SH 27 (SUTURE) ×2
SUT VIC AB 3-0 SH 27X BRD (SUTURE) ×1 IMPLANT
SUT VICRYL 0 AB UR-6 (SUTURE) ×3 IMPLANT
SUTURE MNCRL 4-0 27XMF (SUTURE) ×1 IMPLANT
TROCAR BALLN GELPORT 12X130M (ENDOMECHANICALS) ×3 IMPLANT
TROCAR Z-THREAD OPTICAL 5X100M (TROCAR) ×3 IMPLANT

## 2019-07-01 NOTE — Anesthesia Procedure Notes (Signed)
Procedure Name: Intubation Performed by: Kelton Pillar, CRNA Pre-anesthesia Checklist: Patient identified, Emergency Drugs available, Suction available and Patient being monitored Patient Re-evaluated:Patient Re-evaluated prior to induction Oxygen Delivery Method: Circle system utilized Preoxygenation: Pre-oxygenation with 100% oxygen Induction Type: IV induction Laryngoscope Size: McGraph and 3 Grade View: Grade I Tube size: 7.0 mm Number of attempts: 1 Airway Equipment and Method: Stylet Placement Confirmation: ETT inserted through vocal cords under direct vision,  positive ETCO2,  CO2 detector and breath sounds checked- equal and bilateral Secured at: 21 cm Tube secured with: Tape Dental Injury: Teeth and Oropharynx as per pre-operative assessment

## 2019-07-01 NOTE — Interval H&P Note (Signed)
History and Physical Interval Note:  07/01/2019 1:37 PM  Linda Bradford  has presented today for surgery, with the diagnosis of biliary dyskinesia.  The various methods of treatment have been discussed with the patient and family. After consideration of risks, benefits and other options for treatment, the patient has consented to  Procedure(s): LAPAROSCOPIC CHOLECYSTECTOMY (N/A) as a surgical intervention.  The patient's history has been reviewed, patient examined, no change in status, stable for surgery.  I have reviewed the patient's chart and labs.  Questions were answered to the patient's satisfaction.     Chantea Surace

## 2019-07-01 NOTE — Anesthesia Preprocedure Evaluation (Signed)
Anesthesia Evaluation  Patient identified by MRN, date of birth, ID band Patient awake    Reviewed: Allergy & Precautions, NPO status , Patient's Chart, lab work & pertinent test results  History of Anesthesia Complications Negative for: history of anesthetic complications  Airway Mallampati: II       Dental  (+) Partial Lower, Upper Dentures   Pulmonary neg sleep apnea, COPD,  COPD inhaler, Current Smoker,           Cardiovascular hypertension, Pt. on medications and Pt. on home beta blockers (-) Past MI and (-) CHF + dysrhythmias (Palpitations, PVCs)      Neuro/Psych neg Seizures Anxiety Depression    GI/Hepatic Neg liver ROS, GERD  Medicated and Controlled,  Endo/Other  diabetes, Type 2, Oral Hypoglycemic AgentsHypothyroidism   Renal/GU negative Renal ROS     Musculoskeletal   Abdominal   Peds  Hematology   Anesthesia Other Findings   Reproductive/Obstetrics                             Anesthesia Physical Anesthesia Plan  ASA: III  Anesthesia Plan: General   Post-op Pain Management:    Induction: Intravenous  PONV Risk Score and Plan: 2 and Ondansetron and Dexamethasone  Airway Management Planned: Oral ETT  Additional Equipment:   Intra-op Plan:   Post-operative Plan:   Informed Consent: I have reviewed the patients History and Physical, chart, labs and discussed the procedure including the risks, benefits and alternatives for the proposed anesthesia with the patient or authorized representative who has indicated his/her understanding and acceptance.       Plan Discussed with:   Anesthesia Plan Comments:         Anesthesia Quick Evaluation

## 2019-07-01 NOTE — Op Note (Signed)
  Procedure Date:  07/01/2019  Pre-operative Diagnosis:  Biliary Dyskinesia  Post-operative Diagnosis:  Biliary Dyskinesia  Procedure:  Laparoscopic cholecystectomy  Surgeon:  Melvyn Neth, MD  Anesthesia:  General endotracheal  Estimated Blood Loss:  10 ml  Specimens:  gallbladder  Complications:  None  Indications for Procedure:  This is a 63 y.o. female who presents with abdominal pain and workup revealing biliary dyskinesia.  The benefits, complications, treatment options, and expected outcomes were discussed with the patient. The risks of bleeding, infection, recurrence of symptoms, failure to resolve symptoms, bile duct damage, bile duct leak, retained common bile duct stone, bowel injury, and need for further procedures were all discussed with the patient and she was willing to proceed.  Description of Procedure: The patient was correctly identified in the preoperative area and brought into the operating room.  The patient was placed supine with VTE prophylaxis in place.  Appropriate time-outs were performed.  Anesthesia was induced and the patient was intubated.  Appropriate antibiotics were infused.  The abdomen was prepped and draped in a sterile fashion. An infraumbilical incision was made. A cutdown technique was used to enter the abdominal cavity without injury, and a Hasson trocar was inserted.  Pneumoperitoneum was obtained with appropriate opening pressures.  A 5-mm port was placed in the subxiphoid area and two 5-mm ports were placed in the right upper quadrant under direct visualization.  The gallbladder was identified.  The fundus was grasped and retracted cephalad.  Adhesions were lysed bluntly and with electrocautery. The infundibulum was grasped and retracted laterally, exposing the peritoneum overlying the gallbladder.  This was incised with electrocautery and extended on either side of the gallbladder.  The cystic duct and cystic artery were clearly identified  and bluntly dissected.  Both were clipped twice proximally and once distally, cutting in between.  The gallbladder was taken from the gallbladder fossa in a retrograde fashion with electrocautery. The gallbladder was placed in an Endocatch bag. The liver bed was inspected and any bleeding was controlled with electrocautery. The right upper quadrant was then inspected again revealing intact clips, no bleeding, and no ductal injury.  The area was thoroughly irrigated.  The 5 mm ports were removed under direct visualization and the Hasson trocar was removed.  The Endocatch bag was brought out via the umbilical incision. The fascial opening was closed using 0 vicryl suture.  Local anesthetic was infused in all incisions and the incisions were closed with 4-0 Monocryl.  The wounds were cleaned and sealed with DermaBond.  The patient was emerged from anesthesia and extubated and brought to the recovery room for further management.  The patient tolerated the procedure well and all counts were correct at the end of the case.   Melvyn Neth, MD

## 2019-07-01 NOTE — Transfer of Care (Signed)
Immediate Anesthesia Transfer of Care Note  Patient: Linda Bradford  Procedure(s) Performed: LAPAROSCOPIC CHOLECYSTECTOMY (N/A )  Patient Location: PACU  Anesthesia Type:General  Level of Consciousness: awake  Airway & Oxygen Therapy: Patient Spontanous Breathing and Patient connected to face mask oxygen  Post-op Assessment: Post -op Vital signs reviewed and stable  Post vital signs: stable  Last Vitals:  Vitals Value Taken Time  BP 133/77 07/01/19 1724  Temp    Pulse 86 07/01/19 1724  Resp 14 07/01/19 1724  SpO2 96 % 07/01/19 1724  Vitals shown include unvalidated device data.  Last Pain:  Vitals:   07/01/19 1306  TempSrc: Temporal  PainSc: 3          Complications: No apparent anesthesia complications

## 2019-07-01 NOTE — Anesthesia Post-op Follow-up Note (Signed)
Anesthesia QCDR form completed.        

## 2019-07-01 NOTE — Anesthesia Postprocedure Evaluation (Signed)
Anesthesia Post Note  Patient: Lakyia Furmanski  Procedure(s) Performed: LAPAROSCOPIC CHOLECYSTECTOMY (N/A )  Patient location during evaluation: PACU Anesthesia Type: General Level of consciousness: awake and alert Pain management: pain level controlled Vital Signs Assessment: post-procedure vital signs reviewed and stable Respiratory status: spontaneous breathing, nonlabored ventilation, respiratory function stable and patient connected to nasal cannula oxygen Cardiovascular status: blood pressure returned to baseline and stable Postop Assessment: no apparent nausea or vomiting Anesthetic complications: no     Last Vitals:  Vitals:   07/01/19 1922 07/01/19 1937  BP: (!) 144/66 136/62  Pulse: 81 77  Resp: 12 20  Temp: (!) 36.1 C   SpO2: 93% 92%    Last Pain:  Vitals:   07/01/19 1922  TempSrc:   PainSc: 5                  Martha Clan

## 2019-07-01 NOTE — Discharge Instructions (Signed)

## 2019-07-02 ENCOUNTER — Other Ambulatory Visit: Payer: Self-pay | Admitting: Physician Assistant

## 2019-07-02 ENCOUNTER — Encounter: Payer: Self-pay | Admitting: Surgery

## 2019-07-02 DIAGNOSIS — Z9109 Other allergy status, other than to drugs and biological substances: Secondary | ICD-10-CM

## 2019-07-02 DIAGNOSIS — J418 Mixed simple and mucopurulent chronic bronchitis: Secondary | ICD-10-CM

## 2019-07-02 DIAGNOSIS — J449 Chronic obstructive pulmonary disease, unspecified: Secondary | ICD-10-CM

## 2019-07-03 LAB — SURGICAL PATHOLOGY

## 2019-07-06 ENCOUNTER — Emergency Department: Payer: BC Managed Care – PPO

## 2019-07-06 ENCOUNTER — Other Ambulatory Visit: Payer: Self-pay

## 2019-07-06 ENCOUNTER — Emergency Department
Admission: EM | Admit: 2019-07-06 | Discharge: 2019-07-06 | Disposition: A | Discharge status: Home / Self Care | Payer: BC Managed Care – PPO | Attending: Emergency Medicine | Admitting: Emergency Medicine

## 2019-07-06 DIAGNOSIS — J449 Chronic obstructive pulmonary disease, unspecified: Secondary | ICD-10-CM | POA: Insufficient documentation

## 2019-07-06 DIAGNOSIS — R0789 Other chest pain: Secondary | ICD-10-CM | POA: Diagnosis not present

## 2019-07-06 DIAGNOSIS — Z791 Long term (current) use of non-steroidal anti-inflammatories (NSAID): Secondary | ICD-10-CM | POA: Insufficient documentation

## 2019-07-06 DIAGNOSIS — Z7984 Long term (current) use of oral hypoglycemic drugs: Secondary | ICD-10-CM | POA: Insufficient documentation

## 2019-07-06 DIAGNOSIS — R079 Chest pain, unspecified: Secondary | ICD-10-CM | POA: Diagnosis not present

## 2019-07-06 DIAGNOSIS — E039 Hypothyroidism, unspecified: Secondary | ICD-10-CM | POA: Insufficient documentation

## 2019-07-06 DIAGNOSIS — E119 Type 2 diabetes mellitus without complications: Secondary | ICD-10-CM | POA: Diagnosis not present

## 2019-07-06 DIAGNOSIS — Z79899 Other long term (current) drug therapy: Secondary | ICD-10-CM | POA: Insufficient documentation

## 2019-07-06 DIAGNOSIS — I10 Essential (primary) hypertension: Secondary | ICD-10-CM | POA: Insufficient documentation

## 2019-07-06 DIAGNOSIS — F17210 Nicotine dependence, cigarettes, uncomplicated: Secondary | ICD-10-CM | POA: Diagnosis not present

## 2019-07-06 LAB — COMPREHENSIVE METABOLIC PANEL
ALT: 20 U/L (ref 0–44)
AST: 20 U/L (ref 15–41)
Albumin: 3.9 g/dL (ref 3.5–5.0)
Alkaline Phosphatase: 95 U/L (ref 38–126)
Anion gap: 10 (ref 5–15)
BUN: 14 mg/dL (ref 8–23)
CO2: 28 mmol/L (ref 22–32)
Calcium: 9.2 mg/dL (ref 8.9–10.3)
Chloride: 100 mmol/L (ref 98–111)
Creatinine, Ser: 0.53 mg/dL (ref 0.44–1.00)
GFR calc Af Amer: >60 mL/min (ref >60)
GFR calc non Af Amer: >60 mL/min (ref >60)
Glucose, Bld: 143 mg/dL — ABNORMAL HIGH (ref 70–99)
Potassium: 3.8 mmol/L (ref 3.5–5.1)
Sodium: 138 mmol/L (ref 135–145)
Total Bilirubin: 0.5 mg/dL (ref 0.3–1.2)
Total Protein: 7.5 g/dL (ref 6.5–8.1)

## 2019-07-06 LAB — CBC WITH DIFFERENTIAL/PLATELET
Abs Immature Granulocytes: 0.02 10*3/uL (ref 0.00–0.07)
Basophils Absolute: 0 10*3/uL (ref 0.0–0.1)
Basophils Relative: 1 %
Eosinophils Absolute: 0.2 10*3/uL (ref 0.0–0.5)
Eosinophils Relative: 3 %
HCT: 43.5 % (ref 36.0–46.0)
Hemoglobin: 14.4 g/dL (ref 12.0–15.0)
Immature Granulocytes: 0 %
Lymphocytes Relative: 31 %
Lymphs Abs: 2.2 10*3/uL (ref 0.7–4.0)
MCH: 31.2 pg (ref 26.0–34.0)
MCHC: 33.1 g/dL (ref 30.0–36.0)
MCV: 94.4 fL (ref 80.0–100.0)
Monocytes Absolute: 0.7 10*3/uL (ref 0.1–1.0)
Monocytes Relative: 9 %
Neutro Abs: 3.9 10*3/uL (ref 1.7–7.7)
Neutrophils Relative %: 56 %
Platelets: 257 10*3/uL (ref 150–400)
RBC: 4.61 MIL/uL (ref 3.87–5.11)
RDW: 12.5 % (ref 11.5–15.5)
WBC: 7 10*3/uL (ref 4.0–10.5)
nRBC: 0 % (ref 0.0–0.2)

## 2019-07-06 LAB — LIPASE, BLOOD: Lipase: 25 U/L (ref 11–51)

## 2019-07-06 LAB — TROPONIN I (HIGH SENSITIVITY)
Troponin I (High Sensitivity): 4 ng/L (ref <18)
Troponin I (High Sensitivity): 4 ng/L (ref <18)

## 2019-07-06 MED ORDER — IOHEXOL 350 MG/ML SOLN
75.0000 mL | Freq: Once | INTRAVENOUS | Status: AC | PRN
  Administered 2019-07-06: 75 mL via INTRAVENOUS

## 2019-07-06 NOTE — Discharge Instructions (Addendum)

## 2019-07-06 NOTE — ED Triage Notes (Signed)
Patient presents to Emergency Department [facility] with complaints of CP center chest that started about 1 hour prior.  Pt gallbladder removed last week.  EMS gave 324 ASA  smoker

## 2019-07-06 NOTE — ED Provider Notes (Signed)
[facility] Emergency Department Provider Note  ____________________________________________   First MD Initiated Contact with Patient 07/06/19 0301     (approximate)  I have reviewed the triage vital signs and the nursing notes.   HISTORY  Chief Complaint Chest Pain    HPI Linda Bradford is a 63 y.o. female with medical history as listed below who notably had a cholecystectomy earlier in the week.  She presents tonight for evaluation of episodic chest pain.  She reports that yesterday she had a couple of episodes of pain that she describes as sharp and stabbing in the middle of her chest.  However they were mild and brief and she did not think too much of it.  Tonight she woke up from sleep at 2 AM with severe sharp stabbing chest pain that was persistent for longer.  It did seem to go away but then came back with another pain at which point she called 911.  She reported that the pain was still severe when they arrived and they gave her a full dose aspirin and then the pain resolved and it is completely gone now.  She denies shortness of breath and cough.  She denies COVID-19 contacts.  She denies nausea, vomiting, and abdominal pain and said she is recovering well from her cholecystectomy.  She has a history of diabetes and hyperlipidemia and she smokes  heavily.  No known history of cardiac disease.  No history of blood clots in the legs of the lungs.        Past Medical History:  Diagnosis Date  . Anxiety   . Arthritis   . Cancer ([place])    skin cancer, removed  . COPD (chronic obstructive pulmonary disease) ([place])   . Depression   . Diabetes mellitus without complication ([place])   . Dysrhythmia    occasional pvc's. not being treated.  . Fatigue   . GERD (gastroesophageal reflux disease)   . Headache   . Hypertension   . Hypothyroidism   . Thyroid disease     Patient Active Problem List   Diagnosis Date Noted  . Biliary dyskinesia   .  Diverticulitis of large intestine with abscess 05/27/2018  . Diabetes mellitus without complication ([place]) 123456  . Lateral epicondylitis 07/07/2017  . Personal history of tobacco use, presenting hazards to health 05/03/2016  . Bronchitis, chronic ([place]) 03/03/2015  . Depression, major, recurrent, moderate ([place]) 11/05/2014  . Anxiety, generalized 11/05/2014  . Alcohol abuse, in remission 11/05/2014  . Nicotine addiction 11/05/2014  . H/O: osteoarthritis 11/05/2014  . Fibromyalgia 11/05/2014  . Restless leg syndrome 11/05/2014  . Arthralgia of hip 01/13/2014  . Adiposity 01/13/2014  . Disordered sleep 01/13/2014  . Airway hyperreactivity 12/17/2013  . Adult hypothyroidism 12/17/2013  . Compulsive tobacco user syndrome 12/17/2013  . Current tobacco use 12/17/2013  . Basal cell carcinoma 11/04/2013  . Arthropathia 02/08/2012  . Arthritis of knee, left 02/08/2012    Past Surgical History:  Procedure Laterality Date  . APPENDECTOMY    . CHOLECYSTECTOMY N/A 07/01/2019   Procedure: LAPAROSCOPIC CHOLECYSTECTOMY;  Surgeon: Olean Ree, MD;  Location: [facility] ORS;  Service: General;  Laterality: N/A;  . COLECTOMY WITH COLOSTOMY CREATION/HARTMANN PROCEDURE N/A 09/03/2018   Error, this was not done.  . COLONOSCOPY WITH PROPOFOL N/A 03/08/2019   Procedure: COLONOSCOPY WITH PROPOFOL;  Surgeon: Virgel Manifold, MD;  Location: [facility] ENDOSCOPY;  Service: Endoscopy;  Laterality: N/A;  . CYSTOSCOPY WITH STENT PLACEMENT Bilateral 09/03/2018   Procedure: CYSTOSCOPY  WITH STENT PLACEMENT-LIGHTED STENTS;  Surgeon: Hollice Espy, MD;  Location: [facility] ORS;  Service: Urology;  Laterality: Bilateral;  . LAPAROSCOPIC SIGMOID COLECTOMY N/A 09/03/2018   Procedure: LAPAROSCOPIC SIGMOID COLECTOMY;  Surgeon: Olean Ree, MD;  Location: [facility] ORS;  Service: General;  Laterality: N/A;  . percutaneous drainage tube  07/2018   2nd tube placed. not healing in colon, causing a fistula  . TONSILLECTOMY    . TUBAL  LIGATION      Prior to Admission medications   Medication Sig Start Date End Date Taking? Authorizing Provider  acetaminophen (TYLENOL) 500 MG tablet Take 1,000 mg by mouth every 6 (six) hours as needed for mild pain or moderate pain.     [provider]  Adair Patter [id number] MCG/INH AEPB INHALE 1 PUFF BY MOUTH ONCE DAILY 07/02/19   Mar Daring, PA-C  clonazePAM (KLONOPIN) 0.5 MG tablet TAKE 1/2 TABLET BY MOUTH TWICE DAILY AS NEEDED Patient taking differently: Take 0.25 mg by mouth 2 (two) times daily as needed for anxiety.  03/19/19   Mar Daring, PA-C  diclofenac sodium (VOLTAREN) 1 % GEL Apply 4 g topically 4 (four) times daily as needed. Patient taking differently: Apply 4 g topically 4 (four) times daily as needed. Apply to knees 03/01/18   Burnette, Clearnce Sorrel, PA-C  DULoxetine (CYMBALTA) 60 MG capsule TAKE 1 CAPSULE BY MOUTH TWICE DAILY Patient taking differently: Take 60 mg by mouth 2 (two) times daily.  04/01/19   Mar Daring, PA-C  ergocalciferol (VITAMIN D2) 1.25 MG (50000 UT) capsule Take 1 capsule (50,000 Units total) by mouth once a week. Once a week on Wednesday 03/14/19   Mar Daring, PA-C  ezetimibe (ZETIA) 10 MG tablet TAKE 1 TABLET DAILY Patient taking differently: Take 10 mg by mouth daily.  05/20/19   Mar Daring, PA-C  fluticasone (FLONASE) 50 MCG/ACT nasal spray USE 2 SPRAYS IN EACH NOSTRIL ONCE DAILY 07/02/19   Mar Daring, PA-C  gabapentin (NEURONTIN) 300 MG capsule TAKE 3 CAPSULES BY MOUTH ONCE EVERY MORNING AND 4 CAPSULES AT BEDTIME Patient taking differently: Take 1,200 mg by mouth at bedtime.  06/04/19   Mar Daring, PA-C  glucose blood (CONTOUR NEXT TEST) test strip To check blood sugar once daily 06/09/17   Mar Daring, PA-C  ibuprofen (ADVIL) 600 MG tablet Take 1 tablet (600 mg total) by mouth every 8 (eight) hours as needed for mild pain or moderate pain. 07/01/19   Piscoya, Jacqulyn Bath, MD   ipratropium-albuterol (DUONEB) 0.5-2.5 (3) MG/3ML SOLN Take 3 mLs by nebulization every 6 (six) hours as needed. Patient not taking: Reported on 07/01/2019 11/30/18   Mar Daring, PA-C  levothyroxine (SYNTHROID) 75 MCG tablet TAKE 1 TABLET DAILY ON AN EMPTY STOMACH, WAIT 30 MINUTES BEFORE TAKING OTHER MEDICATIONS Patient taking differently: Take 75 mcg by mouth daily before breakfast.  05/20/19   Burnette, Clearnce Sorrel, PA-C  meclizine (ANTIVERT) 25 MG tablet Take 1 tablet (25 mg total) by mouth 3 (three) times daily as needed for dizziness. Patient not taking: Reported on 07/01/2019 05/15/18   Mar Daring, PA-C  meloxicam (MOBIC) 15 MG tablet TAKE 1 TABLET BY MOUTH ONCE DAILY Patient taking differently: Take 15 mg by mouth daily.  05/01/19   Mar Daring, PA-C  metFORMIN (GLUCOPHAGE-XR) 500 MG 24 hr tablet TAKE 2 TABLETS BY MOUTH ONCE DAILY WITH SUPPER Patient taking differently: Take 1,000 mg by mouth daily with supper.  02/28/19   Fenton Malling  M, PA-C  metoprolol succinate (TOPROL-XL) 50 MG 24 hr tablet Take 1 tablet (50 mg total) by mouth daily. Take with or immediately following a meal. 05/08/19   Burnette, Clearnce Sorrel, PA-C  nitrofurantoin, macrocrystal-monohydrate, (MACROBID) 100 MG capsule Take 1 capsule (100 mg total) by mouth at bedtime. 05/01/19   Mar Daring, PA-C  nystatin (MYCOSTATIN) 100000 UNIT/ML suspension Take 5 mLs (500,000 Units total) by mouth 4 (four) times daily. Patient taking differently: Take 5 mLs by mouth 4 (four) times daily as needed (thrush).  05/01/19   Mar Daring, PA-C  ondansetron (ZOFRAN) 4 MG tablet TAKE 1 TABLET BY MOUTH EVERY 8 HOURS AS NEEDED Patient taking differently: Take 4 mg by mouth every 8 (eight) hours as needed for nausea or vomiting.  05/22/19   Mar Daring, PA-C  oxyCODONE (OXY IR/ROXICODONE) 5 MG immediate release tablet Take 1 tablet (5 mg total) by mouth every 4 (four) hours as needed for severe  pain. 07/01/19   Olean Ree, MD  phenazopyridine (PYRIDIUM) 100 MG tablet Take 1 tablet (100 mg total) by mouth 3 (three) times daily as needed for pain. Patient not taking: Reported on 07/01/2019 04/16/19   Mar Daring, PA-C  polyethylene glycol [facility] / GLYCOLAX) packet Take 17 g by mouth daily. 06/02/18   Tylene Fantasia, PA-C  PROAIR HFA 108 (90 Base) MCG/ACT inhaler INHALE 1 PUFF BY MOUTH EVERY 4 HOURS AS NEEDED Patient taking differently: Inhale 1 puff into the lungs every 4 (four) hours as needed for wheezing or shortness of breath.  05/03/19   Mar Daring, PA-C  pyridOXINE (VITAMIN B-6) 100 MG tablet Take 100 mg by mouth daily.    [provider]  THEO-24 300 MG 24 hr capsule TAKE 1 CAPSULE BY MOUTH ONCE DAILY 07/02/19   Mar Daring, PA-C  tiZANidine (ZANAFLEX) 4 MG tablet TAKE 1 TABLET AT BEDTIME AS NEEDED FOR MUSCLE SPASMS Patient taking differently: Take 4 mg by mouth at bedtime.  05/24/19   Mar Daring, PA-C  vitamin B-12 (CYANOCOBALAMIN) 1000 MCG tablet Take 1,000 mcg by mouth daily.    [provider]  gabapentin (NEURONTIN) 300 MG capsule TAKE 3 CAPSULES BY MOUTH ONCE EVERY MORNING AND 4 CAPSULES AT BEDTIME Patient taking differently: Take 900-1,200 mg by mouth See admin instructions. 900 mg every morning and 1200 mg at bedtime 01/28/19   Mar Daring, PA-C    Allergies Dexilant [dexlansoprazole]  Family History  Problem Relation Age of Onset  . Lung cancer Mother   . Alcohol abuse Sister   . Arthritis Sister   . Alcohol abuse Brother   . Heart disease Brother 23  . Heart attack Brother   . Pancreatic cancer Maternal Aunt   . Diabetes Maternal Aunt   . Alcohol abuse Son   . Drug abuse Son   . Drug abuse Son   . Heart disease Father 19  . Diabetes Sister   . Breast cancer Brother   . Alcohol abuse Maternal Uncle   . Alcohol abuse Maternal Grandmother   . Diabetes Maternal Grandmother     Social  History Social History   Tobacco Use  . Smoking status: Current Every Day Smoker    Packs/day: 1.00    Years: 45.00    Pack years: 45.00    Types: Cigarettes    Start date: 03/02/1980  . Smokeless tobacco: Never Used  . Tobacco comment: now at 5 cigs qd!! d/t chantix and patch  Substance Use Topics  . Alcohol use: No    Alcohol/week: 0.0 standard drinks  . Drug use: No    Review of Systems Constitutional: No fever/chills Eyes: No visual changes. ENT: No sore throat. Cardiovascular: +chest pain. Respiratory: Denies shortness of breath. Gastrointestinal: No abdominal pain.  No nausea, no vomiting.  No diarrhea.  No constipation. Genitourinary: Negative for dysuria. Musculoskeletal: Negative for neck pain.  Negative for back pain. Integumentary: Negative for rash. Neurological: Negative for headaches, focal weakness or numbness.   ____________________________________________   PHYSICAL EXAM:  VITAL SIGNS: ED Triage Vitals [07/06/19 0318]  Enc Vitals Group     BP (!) 156/76     Pulse Rate 71     Resp 18     Temp 97.9 F (36.6 C)     Temp Source Oral     SpO2 92 %     Weight      Height      Head Circumference      Peak Flow      Pain Score      Pain Loc      Pain Edu?      Excl. in [place]?     Constitutional: Alert and oriented.  Well-appearing and currently not in distress although apparently she was hurting quite a bit when EMS arrived. Eyes: Conjunctivae are normal.  Head: Atraumatic. Nose: No congestion/rhinnorhea. Mouth/Throat: Patient is wearing a mask. Neck: No stridor.  No meningeal signs.   Cardiovascular: Normal rate, regular rhythm. Good peripheral circulation. Grossly normal heart sounds. Respiratory: Normal respiratory effort.  No retractions. Gastrointestinal: Soft and nondistended.  Minimal appropriate postop tenderness to palpation throughout with no focal peritonitis.  Well-appearing cholecystectomy wounds with no dehiscence.  Minimal  Musculoskeletal: No lower extremity tenderness nor edema. No gross deformities of extremities. Neurologic:  Normal speech and language. No gross focal neurologic deficits are appreciated.  Skin:  Skin is warm, dry and intact. Psychiatric: Mood and affect are normal. Speech and behavior are normal.  ____________________________________________   LABS (all labs ordered are listed, but only abnormal results are displayed)  Labs Reviewed  COMPREHENSIVE METABOLIC PANEL - Abnormal; Notable for the following components:      Result Value   Glucose, Bld 143 (*)    All other components within normal limits  CBC WITH DIFFERENTIAL/PLATELET  LIPASE, BLOOD  TROPONIN I (HIGH SENSITIVITY)  TROPONIN I (HIGH SENSITIVITY)   ____________________________________________  EKG  ED ECG REPORT I, Hinda Kehr, the attending physician, personally viewed and interpreted this ECG.  Date: 07/06/2019 EKG Time: 3:09 AM Rate: 72 Rhythm: normal sinus rhythm QRS Axis: normal Intervals: normal ST/T Wave abnormalities: normal Narrative Interpretation: no evidence of acute ischemia  ____________________________________________  RADIOLOGY I, Hinda Kehr, personally viewed and evaluated these images (plain radiographs) as part of my medical decision making, as well as reviewing the written report by the radiologist.  ED MD interpretation: No indication of acute abnormality on chest x-ray.  Official radiology report(s): Ct Angio Chest Pe W/cm &/or Wo Cm  Result Date: 07/06/2019 CLINICAL DATA:  Chest pain for 1 hour. Gallbladder surgery 1 week prior. EXAM: CT ANGIOGRAPHY CHEST WITH CONTRAST TECHNIQUE: Multidetector CT imaging of the chest was performed using the standard protocol during bolus administration of intravenous contrast. Multiplanar CT image reconstructions and MIPs were obtained to evaluate the vascular anatomy. CONTRAST:  82mL OMNIPAQUE IOHEXOL 350 MG/ML SOLN COMPARISON:  CT abdomen 04/19/2019  FINDINGS: Cardiovascular: The timing of the contrast bolus is suboptimal in that the  pulmonary veins are more dense than the pulmonary arteries; however there is no evidence of acute pulmonary embolism identified. Mediastinum/Nodes: No axillary or supraclavicular adenopathy. No mediastinal hilar adenopathy. No pericardial effusion. Esophagus normal. Lungs/Pleura: No pulmonary infarction. No pneumonia. No pulmonary edema. Band of atelectasis in the RIGHT lower lobe is similar. RIGHT middle lobe nodule measuring 4 mm unchanged from comparison exam (11/19/2018). Nodule stable compared to more remote CT scans additionally. Upper Abdomen: Limited view of the liver, kidneys, pancreas are unremarkable. Normal adrenal glands. Musculoskeletal: No aggressive osseous lesion. Review of the MIP images confirms the above findings. IMPRESSION: 1. No evidence acute pulmonary embolism. 2. No acute pulmonary parenchymal findings. 3. Stable small RIGHT middle lobe pulmonary nodule is consistent benign etiology per Fleischner criteria. 4. Stable band of atelectasis in the RIGHT middle lobe. Electronically Signed   By: Suzy Bouchard M.D.   On: 07/06/2019 06:02   Dg Chest Portable 1 View  Result Date: 07/06/2019 CLINICAL DATA:  Chest pain EXAM: PORTABLE CHEST 1 VIEW COMPARISON:  November 19, 2018 FINDINGS: The heart size and mediastinal contours are within normal limits. Aortic knob calcifications. Both lungs are clear. The visualized skeletal structures are unremarkable. IMPRESSION: No active disease. Electronically Signed   By: Prudencio Pair M.D.   On: 07/06/2019 03:43    ____________________________________________   PROCEDURES   Procedure(s) performed (including Critical Care):  Procedures   ____________________________________________   INITIAL IMPRESSION / MDM / [place] / ED COURSE  As part of my medical decision making, I reviewed the following data within the [place]  notes reviewed and incorporated, Labs reviewed , EKG interpreted , Old chart reviewed, Radiograph reviewed  and Notes from prior ED visits   Differential diagnosis includes, but is not limited to, ACS, PE, musculoskeletal pain, referred pain from recent abdominal surgery, pneumothorax, pneumonia, less likely COVID-19.  Patient is low risk for ACS based on HEAR score.  I will check a first troponin and if it is normal I will proceed with CTA chest given her recent surgery and immobilization without a history of prior chest pain.  I think that post surgery and at her age a D-dimer would not be helpful.  She is comfortable with this plan.  She is currently chest pain-free.  If she remains chest pain-free throughout her visit in the emergency department and she has to normal high-sensitivity troponins and a negative CTA chest, anticipate discharge with outpatient follow-up.  She understands and agrees with the plan.      Clinical Course as of Jul 06 623  Sat Jul 06, 2019  0433 no active disease  DG Chest Portable 1 View [CF]  [phone] Troponin I (High Sensitivity) [CF]  [phone] CTA chest is reassuring with no evidence of acute abnormalities including pulmonary emboli.  Patient has been pain-free since coming to the ED.  She is comfortable with the plan for discharge and outpatient follow-up with her regular doctor.  I gave my usual and customary return precautions.  CT Angio Chest PE W/Cm &/Or Wo Cm [CF]    Clinical Course User Index [CF] Hinda Kehr, MD     ____________________________________________  FINAL CLINICAL IMPRESSION(S) / ED DIAGNOSES  Final diagnoses:  Chest pain, unspecified type     MEDICATIONS GIVEN DURING THIS VISIT:  Medications  iohexol (OMNIPAQUE) 350 MG/ML injection 75 mL (75 mLs Intravenous Contrast Given 07/06/19 0510)     ED Discharge Orders    None      *  Please note:  Alsha Chmelik was evaluated in Emergency Department on 07/06/2019 for the symptoms described in  the history of present illness. She was evaluated in the context of the global COVID-19 pandemic, which necessitated consideration that the patient might be at risk for infection with the SARS-CoV-2 virus that causes COVID-19. Institutional protocols and algorithms that pertain to the evaluation of patients at risk for COVID-19 are in a state of rapid change based on information released by regulatory bodies including the CDC and federal and state organizations. These policies and algorithms were followed during the patient's care in the ED.  Some ED evaluations and interventions may be delayed as a result of limited staffing during the pandemic.*  Note:  This document was prepared using Dragon voice recognition software and may include unintentional dictation errors.   Hinda Kehr, MD 07/06/19 [phone]

## 2019-07-06 NOTE — ED Notes (Signed)
Patient transported to CT 

## 2019-07-08 ENCOUNTER — Telehealth: Payer: Self-pay | Admitting: *Deleted

## 2019-07-08 NOTE — Telephone Encounter (Signed)
Patient wants a return back to work note for today, she will come pick it up

## 2019-07-08 NOTE — Telephone Encounter (Signed)
Patient notified that she may return to work but she will have lifting restrictions for 6 weeks after surgery. She will pick up her letter later today.

## 2019-07-16 ENCOUNTER — Encounter: Payer: Self-pay | Admitting: Surgery

## 2019-07-16 ENCOUNTER — Ambulatory Visit (INDEPENDENT_AMBULATORY_CARE_PROVIDER_SITE_OTHER): Payer: BC Managed Care – PPO | Admitting: Surgery

## 2019-07-16 ENCOUNTER — Other Ambulatory Visit: Payer: Self-pay

## 2019-07-16 VITALS — BP 145/81 | HR 70 | Temp 97.9°F | Ht 70.0 in | Wt 225.0 lb

## 2019-07-16 DIAGNOSIS — K828 Other specified diseases of gallbladder: Secondary | ICD-10-CM

## 2019-07-16 DIAGNOSIS — Z09 Encounter for follow-up examination after completed treatment for conditions other than malignant neoplasm: Secondary | ICD-10-CM

## 2019-07-16 NOTE — Progress Notes (Signed)
07/16/2019  HPI: Linda Bradford is a 63 y.o. female s/p laparoscopic cholecystectomy on 11/30.  She presents today for follow up.  Reports that she's been doing well overall, with some minor issues.  She's been having some loose stools about every three days.  With that, she gets some right lower quadrant discomfort.  She is also having some discomfort in the epigastric area, between the subxyphoid incision and the umbilical incision.  Vital signs: BP (!) 145/81   Pulse 70   Temp 97.9 F (36.6 C)   Ht 5\' 10"  (1.778 m)   Wt 225 lb (102.1 kg)   SpO2 95%   BMI 32.28 kg/m    Physical Exam: Constitutional:  No acute distress Abdomen:  Soft, non-distended, with some soreness to palpation in the epigastric area, between the subxyphoid incision and the umbilical incision.  The incisions are healing well, clean, dry, intact without any evidence of infection.  There is no RLQ tenderness at this point.  Assessment/Plan: This is a 63 y.o. female s/p laparoscopic cholecystectomy.  --Discussed pathology results -- chronic cholecystitis. --Discussed with her that loose stools may be normal after cholecystectomy.  This will improve as her body adjusts after surgery. --The discomfort in the epigastric area may be due to sutures pulling but at this point there is no evidence of new hernia or other issues.   --Discussed with her that in the surgery, we did notice a very small incisional hernia at the midline incision, inferior to the umbilicus.  However, this was not at our access ports, and since we did not know about it before, and we did not have any consent for it, it was not repaired.  She is asymptomatic from that standpoint and has not had any issues.  At his point, will do watchful waiting. --Follow up prn.   Melvyn Neth, [facility]

## 2019-07-16 NOTE — Patient Instructions (Addendum)
Follow-up with our office as needed.  Please call and ask to speak with a nurse if you develop questions or concerns.   GENERAL POST-OPERATIVE PATIENT INSTRUCTIONS   WOUND CARE INSTRUCTIONS:  Keep a dry clean dressing on the wound if there is drainage. The initial bandage may be removed after 24 hours.  Once the wound has quit draining you may leave it open to air.  If clothing rubs against the wound or causes irritation and the wound is not draining you may cover it with a dry dressing during the daytime.  Try to keep the wound dry and avoid ointments on the wound unless directed to do so.  If the wound becomes bright red and painful or starts to drain infected material that is not clear, please contact your physician immediately.  If the wound is mildly pink and has a thick firm ridge underneath it, this is normal, and is referred to as a healing ridge.  This will resolve over the next 4-6 weeks.  BATHING: You may shower if you have been informed of this by your surgeon. However, Please do not submerge in a tub, hot tub, or pool until incisions are completely sealed or have been told by your surgeon that you may do so.  DIET:  You may eat any foods that you can tolerate.  It is a good idea to eat a high fiber diet and take in plenty of fluids to prevent constipation.  If you do become constipated you may want to take a mild laxative or take ducolax tablets on a daily basis until your bowel habits are regular.  Constipation can be very uncomfortable, along with straining, after recent surgery.  ACTIVITY:  You are encouraged to cough and deep breath or use your incentive spirometer if you were given one, every 15-30 minutes when awake.  This will help prevent respiratory complications and low grade fevers post-operatively if you had a general anesthetic.  You may want to hug a pillow when coughing and sneezing to add additional support to the surgical area, if you had abdominal or chest surgery, which  will decrease pain during these times.  You are encouraged to walk and engage in light activity for the next two weeks.  You should not lift more than 20 pounds for 6 weeks after surgery as it could put you at increased risk for complications.  Twenty pounds is roughly equivalent to a plastic bag of groceries. At that time- Listen to your body when lifting, if you have pain when lifting, stop and then try again in a few days. Soreness after doing exercises or activities of daily living is normal as you get back in to your normal routine.  MEDICATIONS:  Try to take narcotic medications and anti-inflammatory medications, such as tylenol, ibuprofen, naprosyn, etc., with food.  This will minimize stomach upset from the medication.  Should you develop nausea and vomiting from the pain medication, or develop a rash, please discontinue the medication and contact your physician.  You should not drive, make important decisions, or operate machinery when taking narcotic pain medication.  SUNBLOCK Use sun block to incision area over the next year if this area will be exposed to sun. This helps decrease scarring and will allow you avoid a permanent darkened area over your incision.  QUESTIONS:  Please feel free to call our office if you have any questions, and we will be glad to assist you.    

## 2019-07-25 DIAGNOSIS — M6530 Trigger finger, unspecified finger: Secondary | ICD-10-CM | POA: Diagnosis not present

## 2019-07-25 DIAGNOSIS — M65341 Trigger finger, right ring finger: Secondary | ICD-10-CM | POA: Diagnosis not present

## 2019-08-08 DIAGNOSIS — M65341 Trigger finger, right ring finger: Secondary | ICD-10-CM | POA: Diagnosis not present

## 2019-08-14 ENCOUNTER — Ambulatory Visit: Payer: BC Managed Care – PPO | Admitting: Urology

## 2019-08-15 ENCOUNTER — Other Ambulatory Visit: Payer: Self-pay | Admitting: Physician Assistant

## 2019-08-15 DIAGNOSIS — E119 Type 2 diabetes mellitus without complications: Secondary | ICD-10-CM

## 2019-08-15 DIAGNOSIS — R110 Nausea: Secondary | ICD-10-CM

## 2019-08-16 ENCOUNTER — Other Ambulatory Visit: Payer: Self-pay | Admitting: Physician Assistant

## 2019-08-16 DIAGNOSIS — J441 Chronic obstructive pulmonary disease with (acute) exacerbation: Secondary | ICD-10-CM

## 2019-08-16 NOTE — Telephone Encounter (Signed)
Requested medication (s) are due for refill today: no  Requested medication (s) are on the active medication list: no  Last refill:  11/30/2018  Future visit scheduled: no  Notes to clinic:  Patient requesting refill  Requested Prescriptions  Pending Prescriptions Disp Refills   benzonatate (TESSALON) 200 MG capsule [Pharmacy Med Name: BENZONATATE 200 MG CAP] 30 capsule 0    Sig: TAKE 1 CAPSULE BY MOUTH 3 TIMES DAILY ASNEEDED      Ear, Nose, and Throat:  Antitussives/Expectorants Passed - 08/16/2019  6:15 PM      Passed - Valid encounter within last 12 months    Recent Outpatient Visits           3 months ago Recurrent UTI   [place], [place]   4 months ago Recurrent UTI   [place], Clearnce Sorrel, [place]   5 months ago [place], [place], [place]   6 months ago Frequency of urination   [place], [place]   8 months ago Exposure to [place], Clearnce Sorrel, PA-C       Future Appointments             In 1 month Diamantina [place], Herbert Seta, [facility]

## 2019-08-22 ENCOUNTER — Ambulatory Visit: Payer: BC Managed Care – PPO | Admitting: Urology

## 2019-08-26 DIAGNOSIS — M25511 Pain in right shoulder: Secondary | ICD-10-CM | POA: Diagnosis not present

## 2019-08-30 DIAGNOSIS — M25611 Stiffness of right shoulder, not elsewhere classified: Secondary | ICD-10-CM | POA: Diagnosis not present

## 2019-08-30 DIAGNOSIS — M25511 Pain in right shoulder: Secondary | ICD-10-CM | POA: Diagnosis not present

## 2019-09-04 DIAGNOSIS — M25511 Pain in right shoulder: Secondary | ICD-10-CM | POA: Diagnosis not present

## 2019-09-04 DIAGNOSIS — M25611 Stiffness of right shoulder, not elsewhere classified: Secondary | ICD-10-CM | POA: Diagnosis not present

## 2019-09-05 ENCOUNTER — Other Ambulatory Visit: Payer: Self-pay | Admitting: Physician Assistant

## 2019-09-05 DIAGNOSIS — B370 Candidal stomatitis: Secondary | ICD-10-CM

## 2019-09-05 NOTE — Telephone Encounter (Signed)
Requested medication (s) are due for refill today: yes  Requested medication (s) are on the active medication list: yes  Last refill:  05/01/2019  Future visit scheduled: no  Notes to clinic:  no assigned protocol    Requested Prescriptions  Pending Prescriptions Disp Refills   nystatin (MYCOSTATIN) 100000 UNIT/ML suspension [Pharmacy Med Name: NYSTATIN 100000 UNIT/ML MOUTH SUSP] 473 mL 0    Sig: TAKE 1 TEASPOON BY MOUTH 4 TIMES DAILY      Off-Protocol Failed - 09/05/2019  9:19 AM      Failed - Medication not assigned to a protocol, review manually.      Passed - Valid encounter within last 12 months    Recent Outpatient Visits           4 months ago Recurrent UTI   [place], [place]   4 months ago Recurrent UTI   [place], Clearnce Sorrel, [place]   5 months ago [place], [place]   6 months ago Frequency of urination   [place], [place]   8 months ago Exposure to [place], Clearnce Sorrel, PA-C       Future Appointments             In 6 days Diamantina [place], Herbert Seta, [facility]

## 2019-09-11 ENCOUNTER — Encounter: Payer: Self-pay | Admitting: Urology

## 2019-09-11 ENCOUNTER — Ambulatory Visit: Payer: BC Managed Care – PPO | Admitting: Urology

## 2019-09-11 DIAGNOSIS — M25511 Pain in right shoulder: Secondary | ICD-10-CM | POA: Diagnosis not present

## 2019-09-11 DIAGNOSIS — M25611 Stiffness of right shoulder, not elsewhere classified: Secondary | ICD-10-CM | POA: Diagnosis not present

## 2019-09-13 DIAGNOSIS — M25611 Stiffness of right shoulder, not elsewhere classified: Secondary | ICD-10-CM | POA: Diagnosis not present

## 2019-09-13 DIAGNOSIS — M25511 Pain in right shoulder: Secondary | ICD-10-CM | POA: Diagnosis not present

## 2019-09-16 DIAGNOSIS — M25511 Pain in right shoulder: Secondary | ICD-10-CM | POA: Diagnosis not present

## 2019-09-16 DIAGNOSIS — M25611 Stiffness of right shoulder, not elsewhere classified: Secondary | ICD-10-CM | POA: Diagnosis not present

## 2019-10-02 ENCOUNTER — Ambulatory Visit: Payer: BC Managed Care – PPO | Admitting: Urology

## 2019-10-07 ENCOUNTER — Other Ambulatory Visit: Payer: Self-pay | Admitting: Physician Assistant

## 2019-10-07 DIAGNOSIS — F419 Anxiety disorder, unspecified: Secondary | ICD-10-CM

## 2019-10-07 NOTE — Telephone Encounter (Signed)
Requested medication (s) are due for refill today: yes  Requested medication (s) are on the active medication list: yes  Last refill:  09/05/19  Future visit scheduled: no  Notes to clinic:  not delegated   Requested Prescriptions  Pending Prescriptions Disp Refills   clonazePAM (KLONOPIN) 0.5 MG tablet [Pharmacy Med Name: CLONAZEPAM 0.5 MG TAB] 180 tablet     Sig: TAKE 1/2 TABLET BY MOUTH TWICE DAILY AS NEEDED      Not Delegated - Psychiatry:  Anxiolytics/Hypnotics Failed - 10/07/2019  2:07 PM      Failed - This refill cannot be delegated      Failed - Urine Drug Screen completed in last 360 days.      Passed - Valid encounter within last 6 months    Recent Outpatient Visits           5 months ago Recurrent UTI   [place], [place]   5 months ago Recurrent UTI   [place], Clearnce Sorrel, [place]   6 months ago [place], [place]   8 months ago Frequency of urination   [place], [place]   10 months ago Exposure to [place], Clearnce Sorrel, [place]

## 2019-10-12 ENCOUNTER — Other Ambulatory Visit: Payer: Self-pay | Admitting: Physician Assistant

## 2019-10-12 DIAGNOSIS — F329 Major depressive disorder, single episode, unspecified: Secondary | ICD-10-CM

## 2019-10-12 DIAGNOSIS — F32A Depression, unspecified: Secondary | ICD-10-CM

## 2019-10-12 DIAGNOSIS — N390 Urinary tract infection, site not specified: Secondary | ICD-10-CM

## 2019-10-12 NOTE — Telephone Encounter (Signed)
Requested Prescriptions  Pending Prescriptions Disp Refills  . nitrofurantoin, macrocrystal-monohydrate, (MACROBID) 100 MG capsule [Pharmacy Med Name: NITROFURANTOIN MONOHYD MACRO 100 MG] 90 capsule 0    Sig: TAKE 1 CAPSULE BY MOUTH AT BEDTIME     Off-Protocol Failed - 10/12/2019  1:00 PM      Failed - Medication not assigned to a protocol, review manually.      Passed - Valid encounter within last 12 months    Recent Outpatient Visits          5 months ago Recurrent UTI   [place], [place]   5 months ago Recurrent UTI   [place], Clearnce Sorrel, [place]   7 months ago [place], [place]   8 months ago Frequency of urination   [place], [place]   10 months ago Exposure to [place], [place], [place]             . DULoxetine (CYMBALTA) 60 MG capsule [Pharmacy Med Name: DULOXETINE HCL 60 MG CAP] 180 capsule 0    Sig: TAKE 1 CAPSULE BY MOUTH TWICE DAILY     Psychiatry: Antidepressants - SNRI Failed - 10/12/2019  1:00 PM      Failed - Last BP in normal range    BP Readings from Last 1 Encounters:  07/16/19 (!) 145/81         Passed - Completed PHQ-2 or PHQ-9 in the last 360 days.      Passed - Valid encounter within last 6 months    Recent Outpatient Visits          5 months ago Recurrent UTI   [place], [place]   5 months ago Recurrent UTI   [place], Clearnce Sorrel, [place]   7 months ago [place], [place]   8 months ago Frequency of urination   [place], [place]   10 months ago Exposure to [place], Clearnce Sorrel, [place]

## 2019-12-02 ENCOUNTER — Ambulatory Visit (INDEPENDENT_AMBULATORY_CARE_PROVIDER_SITE_OTHER): Payer: BC Managed Care – PPO | Admitting: Physician Assistant

## 2019-12-02 ENCOUNTER — Encounter: Payer: Self-pay | Admitting: Physician Assistant

## 2019-12-02 ENCOUNTER — Other Ambulatory Visit: Payer: Self-pay

## 2019-12-02 VITALS — BP 140/82 | HR 78 | Temp 96.9°F | Resp 16 | Ht 70.0 in | Wt 226.8 lb

## 2019-12-02 DIAGNOSIS — S39012A Strain of muscle, fascia and tendon of lower back, initial encounter: Secondary | ICD-10-CM | POA: Diagnosis not present

## 2019-12-02 DIAGNOSIS — Z0000 Encounter for general adult medical examination without abnormal findings: Secondary | ICD-10-CM

## 2019-12-02 DIAGNOSIS — Z1239 Encounter for other screening for malignant neoplasm of breast: Secondary | ICD-10-CM

## 2019-12-02 DIAGNOSIS — E039 Hypothyroidism, unspecified: Secondary | ICD-10-CM

## 2019-12-02 DIAGNOSIS — F17200 Nicotine dependence, unspecified, uncomplicated: Secondary | ICD-10-CM

## 2019-12-02 DIAGNOSIS — F331 Major depressive disorder, recurrent, moderate: Secondary | ICD-10-CM | POA: Diagnosis not present

## 2019-12-02 DIAGNOSIS — J441 Chronic obstructive pulmonary disease with (acute) exacerbation: Secondary | ICD-10-CM | POA: Diagnosis not present

## 2019-12-02 DIAGNOSIS — E119 Type 2 diabetes mellitus without complications: Secondary | ICD-10-CM

## 2019-12-02 LAB — POCT URINALYSIS DIPSTICK
Bilirubin, UA: NEGATIVE
Blood, UA: NEGATIVE
Glucose, UA: NEGATIVE
Ketones, UA: NEGATIVE
Leukocytes, UA: NEGATIVE
Nitrite, UA: NEGATIVE
Protein, UA: NEGATIVE
Spec Grav, UA: 1.015 (ref 1.010–1.025)
Urobilinogen, UA: 0.2 E.U./dL
pH, UA: 7 (ref 5.0–8.0)

## 2019-12-02 LAB — POCT UA - MICROALBUMIN: Microalbumin Ur, POC: NEGATIVE mg/L

## 2019-12-02 MED ORDER — KETOROLAC TROMETHAMINE 60 MG/2ML IM SOLN
60.0000 mg | Freq: Once | INTRAMUSCULAR | Status: AC
  Administered 2019-12-02: 60 mg via INTRAMUSCULAR

## 2019-12-02 NOTE — Progress Notes (Signed)
Complete physical exam   Patient: Linda Bradford   DOB: 23-Apr-1956   64 y.o. Female  MRN: [id number] Visit Date: 12/02/2019  Today's healthcare provider: Mar Daring, PA-C   Chief Complaint  Patient presents with  . Annual Exam   Subjective    Linda Bradford is a 64 y.o. female who presents today for a complete physical exam.  She reports consuming a general diet. Home exercise routine includes walk. She generally feels fairly well. She reports sleeping fairly well. She does have additional problems to discuss today.  HPI  Back Pain: Patient reports that as the day went on yesterday her lower back started to hurt. She reports that she woke up feeling worse this morning and she took a muscle relaxer. Reports it hurts to walk. Reports that no injury. Reports that she has been painting and scrubbing things around the house. Her back gets aggravated with sitting for too long and with movement. Heating pad makes it feel better. She has also taken Tylenol for the pain.  Past Medical History:  Diagnosis Date  . Anxiety   . Arthritis   . Cancer ([place])    skin cancer, removed  . COPD (chronic obstructive pulmonary disease) ([place])   . Depression   . Diabetes mellitus without complication ([place])   . Dysrhythmia    occasional pvc's. not being treated.  . Fatigue   . GERD (gastroesophageal reflux disease)   . Headache   . Hypertension   . Hypothyroidism   . Thyroid disease    Past Surgical History:  Procedure Laterality Date  . APPENDECTOMY    . CHOLECYSTECTOMY N/A 07/01/2019   Procedure: LAPAROSCOPIC CHOLECYSTECTOMY;  Surgeon: Olean Ree, MD;  Location: [facility] ORS;  Service: General;  Laterality: N/A;  . COLECTOMY WITH COLOSTOMY CREATION/HARTMANN PROCEDURE N/A 09/03/2018   Error, this was not done.  . COLONOSCOPY WITH PROPOFOL N/A 03/08/2019   Procedure: COLONOSCOPY WITH PROPOFOL;  Surgeon: Virgel Manifold, MD;  Location: [facility] ENDOSCOPY;  Service: Endoscopy;  Laterality: N/A;    . CYSTOSCOPY WITH STENT PLACEMENT Bilateral 09/03/2018   Procedure: CYSTOSCOPY WITH STENT PLACEMENT-LIGHTED STENTS;  Surgeon: Hollice Espy, MD;  Location: [facility] ORS;  Service: Urology;  Laterality: Bilateral;  . LAPAROSCOPIC SIGMOID COLECTOMY N/A 09/03/2018   Procedure: LAPAROSCOPIC SIGMOID COLECTOMY;  Surgeon: Olean Ree, MD;  Location: [facility] ORS;  Service: General;  Laterality: N/A;  . percutaneous drainage tube  07/2018   2nd tube placed. not healing in colon, causing a fistula  . TONSILLECTOMY    . TUBAL LIGATION     Social History   Socioeconomic History  . Marital status: Married    Spouse name: Clair Gulling  . Number of children: 2  . Years of education: Not on file  . Highest education level: Not on file  Occupational History  . Occupation: Careers information officer    Comment: currently employed  Tobacco Use  . Smoking status: Current Every Day Smoker    Packs/day: 1.50    Years: 45.00    Pack years: 67.50    Types: Cigarettes    Start date: 03/02/1980  . Smokeless tobacco: Never Used  Substance and Sexual Activity  . Alcohol use: No    Alcohol/week: 0.0 standard drinks  . Drug use: No  . Sexual activity: Yes  Other Topics Concern  . Not on file  Social History Narrative  . Not on file   Social Determinants of Health   Financial Resource Strain:   . Difficulty of  Paying Living Expenses:   Food Insecurity:   . Worried About Charity fundraiser in the Last Year:   . Arboriculturist in the Last Year:   Transportation Needs:   . Film/video editor (Medical):   Marland Kitchen Lack of Transportation (Non-Medical):   Physical Activity:   . Days of Exercise per Week:   . Minutes of Exercise per Session:   Stress:   . Feeling of Stress :   Social Connections:   . Frequency of Communication with Friends and Family:   . Frequency of Social Gatherings with Friends and Family:   . Attends Religious Services:   . Active Member of Clubs or Organizations:   . Attends Archivist  Meetings:   Marland Kitchen Marital Status:   Intimate Partner Violence:   . Fear of Current or Ex-Partner:   . Emotionally Abused:   Marland Kitchen Physically Abused:   . Sexually Abused:    Family Status  Relation Name Status  . Mother  Deceased  . Sister  Alive  . Brother  Deceased at age 28  . Mat Aunt  Deceased  . Son  Alive  . Son  Alive  . Father  Deceased at age 88  . Sister  Alive  . Brother  Deceased  . Mat Uncle  (Not Specified)  . MGM  Alive   Family History  Problem Relation Age of Onset  . Lung cancer Mother   . Alcohol abuse Sister   . Arthritis Sister   . Alcohol abuse Brother   . Heart disease Brother 30  . Heart attack Brother   . Pancreatic cancer Maternal Aunt   . Diabetes Maternal Aunt   . Alcohol abuse Son   . Drug abuse Son   . Drug abuse Son   . Heart disease Father 38  . Diabetes Sister   . Breast cancer Brother   . Alcohol abuse Maternal Uncle   . Alcohol abuse Maternal Grandmother   . Diabetes Maternal Grandmother    Allergies  Allergen Reactions  . Dexilant [Dexlansoprazole] Nausea Only and Other (See Comments)    Dizziness    Patient Care Team: Mar Daring, PA-C as PCP - General (Family Medicine)   Medications: Outpatient Medications Prior to Visit  Medication Sig  . acetaminophen (TYLENOL) 500 MG tablet Take 1,000 mg by mouth every 6 (six) hours as needed for mild pain or moderate pain.   . benzonatate (TESSALON) 200 MG capsule TAKE 1 CAPSULE BY MOUTH 3 TIMES DAILY ASNEEDED  . BREO ELLIPTA 200-25 MCG/INH AEPB INHALE 1 PUFF BY MOUTH ONCE DAILY  . clonazePAM (KLONOPIN) 0.5 MG tablet TAKE 1/2 TABLET BY MOUTH TWICE DAILY AS NEEDED  . diclofenac sodium (VOLTAREN) 1 % GEL Apply 4 g topically 4 (four) times daily as needed. (Patient taking differently: Apply 4 g topically 4 (four) times daily as needed. Apply to knees)  . DULoxetine (CYMBALTA) 60 MG capsule TAKE 1 CAPSULE BY MOUTH TWICE DAILY  . ergocalciferol (VITAMIN D2) 1.25 MG (50000 UT) capsule  Take 1 capsule (50,000 Units total) by mouth once a week. Once a week on Wednesday  . ezetimibe (ZETIA) 10 MG tablet TAKE 1 TABLET DAILY (Patient taking differently: Take 10 mg by mouth daily. )  . fluticasone (FLONASE) 50 MCG/ACT nasal spray USE 2 SPRAYS IN EACH NOSTRIL ONCE DAILY  . gabapentin (NEURONTIN) 300 MG capsule TAKE 3 CAPSULES BY MOUTH ONCE EVERY MORNING AND 4 CAPSULES AT BEDTIME (Patient taking differently:  Take 1,200 mg by mouth at bedtime. )  . glucose blood (CONTOUR NEXT TEST) test strip To check blood sugar once daily  . ibuprofen (ADVIL) 600 MG tablet Take 1 tablet (600 mg total) by mouth every 8 (eight) hours as needed for mild pain or moderate pain.  Marland Kitchen ipratropium-albuterol (DUONEB) 0.5-2.5 (3) MG/3ML SOLN Take 3 mLs by nebulization every 6 (six) hours as needed.  Marland Kitchen levothyroxine (SYNTHROID) 75 MCG tablet TAKE 1 TABLET DAILY ON AN EMPTY STOMACH, WAIT 30 MINUTES BEFORE TAKING OTHER MEDICATIONS (Patient taking differently: Take 75 mcg by mouth daily before breakfast. )  . meclizine (ANTIVERT) 25 MG tablet Take 1 tablet (25 mg total) by mouth 3 (three) times daily as needed for dizziness.  . meloxicam (MOBIC) 15 MG tablet TAKE 1 TABLET BY MOUTH ONCE DAILY (Patient taking differently: Take 15 mg by mouth daily. )  . metFORMIN (GLUCOPHAGE-XR) 500 MG 24 hr tablet TAKE 2 TABLETS BY MOUTH ONCE DAILY WITH SUPPER  . metoprolol succinate (TOPROL-XL) 50 MG 24 hr tablet Take 1 tablet (50 mg total) by mouth daily. Take with or immediately following a meal.  . nitrofurantoin, macrocrystal-monohydrate, (MACROBID) 100 MG capsule TAKE 1 CAPSULE BY MOUTH AT BEDTIME  . nystatin (MYCOSTATIN) 100000 UNIT/ML suspension TAKE 1 TEASPOON BY MOUTH 4 TIMES DAILY  . ondansetron (ZOFRAN) 4 MG tablet TAKE 1 TABLET BY MOUTH EVERY 8 HOURS AS NEEDED  . phenazopyridine (PYRIDIUM) 100 MG tablet Take 1 tablet (100 mg total) by mouth 3 (three) times daily as needed for pain.  . polyethylene glycol (MIRALAX / GLYCOLAX)  packet Take 17 g by mouth daily.  Marland Kitchen PROAIR HFA 108 (90 Base) MCG/ACT inhaler INHALE 1 PUFF BY MOUTH EVERY 4 HOURS AS NEEDED (Patient taking differently: Inhale 1 puff into the lungs every 4 (four) hours as needed for wheezing or shortness of breath. )  . pyridOXINE (VITAMIN B-6) 100 MG tablet Take 100 mg by mouth daily.  Marland Kitchen THEO-24 300 MG 24 hr capsule TAKE 1 CAPSULE BY MOUTH ONCE DAILY  . tiZANidine (ZANAFLEX) 4 MG tablet TAKE 1 TABLET AT BEDTIME AS NEEDED FOR MUSCLE SPASMS (Patient taking differently: Take 4 mg by mouth at bedtime. )  . vitamin B-12 (CYANOCOBALAMIN) 1000 MCG tablet Take 1,000 mcg by mouth daily.   No facility-administered medications prior to visit.    Review of Systems  Constitutional: Negative.   HENT: Negative.   Eyes: Negative.   Respiratory: Positive for cough.   Cardiovascular: Negative.   Gastrointestinal: Negative.   Endocrine: Negative.   Genitourinary: Negative.   Musculoskeletal: Positive for back pain and myalgias.  Skin: Negative.   Allergic/Immunologic: Negative.   Neurological: Negative.  Negative for weakness and numbness.  Hematological: Negative.   Psychiatric/Behavioral: Negative.        Objective    BP 140/82 (BP Location: Left Arm, Patient Position: Sitting, Cuff Size: Large)   Pulse 78   Temp (!) 96.9 F (36.1 C) (Temporal)   Resp 16   Ht 5\' 10"  (1.778 m)   Wt 226 lb 12.8 oz (102.9 kg)   BMI 32.54 kg/m  BP Readings from Last 3 Encounters:  12/03/19 (!) 156/66  12/02/19 140/82  07/16/19 (!) 145/81   Wt Readings from Last 3 Encounters:  12/02/19 226 lb 12.8 oz (102.9 kg)  07/16/19 225 lb (102.1 kg)  07/06/19 224 lb 13.9 oz (102 kg)      Physical Exam Vitals reviewed.  Constitutional:      General: She is not  in acute distress.    Appearance: Normal appearance. She is well-developed. She is obese. She is not ill-appearing or diaphoretic.  HENT:     Head: Normocephalic and atraumatic.     Right Ear: Tympanic membrane, ear  canal and external ear normal.     Left Ear: Tympanic membrane, ear canal and external ear normal.  Eyes:     General: No scleral icterus.       Right eye: No discharge.        Left eye: No discharge.     Extraocular Movements: Extraocular movements intact.     Conjunctiva/sclera: Conjunctivae normal.     Pupils: Pupils are equal, round, and reactive to light.  Neck:     Thyroid: No thyromegaly.     Vascular: No carotid bruit or JVD.     Trachea: No tracheal deviation.  Cardiovascular:     Rate and Rhythm: Normal rate and regular rhythm.     Pulses: Normal pulses.     Heart sounds: Murmur present. No friction rub. No gallop.   Pulmonary:     Effort: Pulmonary effort is normal. No respiratory distress.     Breath sounds: Normal breath sounds. No wheezing or rales.  Chest:     Chest wall: No tenderness.  Abdominal:     General: Abdomen is flat. Bowel sounds are normal. There is no distension.     Palpations: Abdomen is soft. There is no mass.     Tenderness: There is no abdominal tenderness. There is no guarding or rebound.  Musculoskeletal:     Cervical back: Normal, normal range of motion and neck supple.     Thoracic back: No spasms, tenderness or bony tenderness. Decreased range of motion.     Lumbar back: Tenderness present. No spasms or bony tenderness. Decreased range of motion. Negative right straight leg raise test and negative left straight leg raise test.     Right lower leg: No edema.     Left lower leg: No edema.  Lymphadenopathy:     Cervical: No cervical adenopathy.  Skin:    General: Skin is warm and dry.     Capillary Refill: Capillary refill takes less than 2 seconds.     Findings: No rash.  Neurological:     General: No focal deficit present.     Mental Status: She is alert and oriented to person, place, and time. Mental status is at baseline.  Psychiatric:        Mood and Affect: Mood normal.        Behavior: Behavior normal.        Thought Content:  Thought content normal.        Judgment: Judgment normal.       Depression Screen  PHQ 2/9 Scores 12/02/2019 04/18/2019 06/01/2017  PHQ - 2 Score 0 0 0  PHQ- 9 Score 0 2 -    No results found for any visits on 12/02/19.  Assessment & Plan    Routine Health Maintenance and Physical Exam  Exercise Activities and Dietary recommendations Goals    . Quit smoking / using tobacco       Immunization History  Administered Date(s) Administered  . Influenza,inj,Quad PF,6+ Mos 05/10/2017  . PFIZER SARS-COV-2 Vaccination 10/19/2019, 11/09/2019  . Pneumococcal Conjugate-13 05/10/2016  . Tdap 08/24/2017  . Zoster 05/10/2016    Health Maintenance  Topic Date Due  . PNEUMOCOCCAL POLYSACCHARIDE VACCINE AGE 40-64 HIGH RISK  Never done  . FOOT EXAM  Never  done  . OPHTHALMOLOGY EXAM  Never done  . URINE MICROALBUMIN  Never done  . MAMMOGRAM  10/12/2016  . HEMOGLOBIN A1C  02/15/2019  . INFLUENZA VACCINE  03/01/2020  . PAP SMEAR-Modifier  05/10/2020  . COLONOSCOPY  03/07/2022  . TETANUS/TDAP  08/25/2027  . COVID-19 Vaccine  Completed  . Hepatitis C Screening  Completed  . HIV Screening  Completed    Discussed health benefits of physical activity, and encouraged her to engage in regular exercise appropriate for her age and condition.  1. Annual physical exam Normal physical exam today. Will check labs as below and f/u pending lab results. If labs are stable and WNL she will not need to have these rechecked for one year at her next annual physical exam. She is to call the office in the meantime if she has any acute issue, questions or concerns. - CBC with Differential/Platelet - Comprehensive metabolic panel - Hemoglobin A1c - Lipid panel - TSH  2. Encounter for breast cancer screening using non-mammogram modality Breast exam today was normal. There is no family history of breast cancer. She does perform regular self breast exams. Mammogram was ordered as below. Information for [facility]  Breast clinic was given to patient so she may schedule her mammogram at her convenience. - MM 3D SCREEN BREAST BILATERAL; Future  3. Chronic obstructive pulmonary disease with acute exacerbation (HCC) Stable. Continue Breo, tessalon perles prn, duoneb nebulizer prn, proair prn, theophylline XR 300mg . Will check labs as below and f/u pending results. - CBC with Differential/Platelet - Comprehensive metabolic panel  4. Depression, major, recurrent, moderate (HCC) Stable. Continue Duloxetine 60mg  BID. Will check labs as below and f/u pending results. - CBC with Differential/Platelet - Comprehensive metabolic panel  5. Diabetes mellitus without complication (HCC) Microalbumin normal. Continue Metformin XR 500mg . Will check labs as below and f/u pending results. - CBC with Differential/Platelet - Comprehensive metabolic panel - Hemoglobin A1c - Lipid panel - POCT UA - Microalbumin  6. Adult hypothyroidism Stable. Continue Levothyroxine 72mcg. Will check labs as below and f/u pending results. - CBC with Differential/Platelet - Comprehensive metabolic panel - TSH  7. Compulsive tobacco user syndrome Continues to smoke. Does not desire to quit yet. Will check labs as below and f/u pending results. - CBC with Differential/Platelet - Comprehensive metabolic panel - Lipid panel  8. Low back strain, initial encounter New. Suspect from cleaning walls and baseboards. Toradol IM given as below. UA normal. Continue heating pad. Call if worsening.  - ketorolac (TORADOL) injection 60 mg - POCT Urinalysis Dipstick  No follow-ups on file.     Reynolds Bowl, PA-C, have reviewed all documentation for this visit. The documentation on 12/04/19 for the exam, diagnosis, procedures, and orders are all accurate and complete.   Rubye Beach  [facility] [phone] (phone) [phone] (fax)  [place]

## 2019-12-02 NOTE — Patient Instructions (Signed)
[facility] [address] [place],  [place]  [address] Get Driving Directions Main: [phone]   Health Maintenance for Postmenopausal Women Menopause is a normal process in which your ability to get pregnant comes to an end. This process happens slowly over many months or years, usually between the ages of 48 and 55. Menopause is complete when you have missed your menstrual periods for 12 months. It is important to talk with your health care provider about some of the most common conditions that affect women after menopause (postmenopausal women). These include heart disease, cancer, and bone loss (osteoporosis). Adopting a healthy lifestyle and getting preventive care can help to promote your health and wellness. The actions you take can also lower your chances of developing some of these common conditions. What should I know about menopause? During menopause, you may get a number of symptoms, such as:  Hot flashes. These can be moderate or severe.  Night sweats.  Decrease in sex drive.  Mood swings.  Headaches.  Tiredness.  Irritability.  Memory problems.  Insomnia. Choosing to treat or not to treat these symptoms is a decision that you make with your health care provider. Do I need hormone replacement therapy?  Hormone replacement therapy is effective in treating symptoms that are caused by menopause, such as hot flashes and night sweats.  Hormone replacement carries certain risks, especially as you become older. If you are thinking about using estrogen or estrogen with progestin, discuss the benefits and risks with your health care provider. What is my risk for heart disease and stroke? The risk of heart disease, heart attack, and stroke increases as you age. One of the causes may be a change in the body's hormones during menopause. This can affect how your body uses dietary fats, triglycerides, and cholesterol. Heart attack and stroke  are medical emergencies. There are many things that you can do to help prevent heart disease and stroke. Watch your blood pressure  High blood pressure causes heart disease and increases the risk of stroke. This is more likely to develop in people who have high blood pressure readings, are of African descent, or are overweight.  Have your blood pressure checked: ? Every 3-5 years if you are 18-39 years of age. ? Every year if you are 40 years old or older. Eat a healthy diet   Eat a diet that includes plenty of vegetables, fruits, low-fat dairy products, and lean protein.  Do not eat a lot of foods that are high in solid fats, added sugars, or sodium. Get regular exercise Get regular exercise. This is one of the most important things you can do for your health. Most adults should:  Try to exercise for at least 150 minutes each week. The exercise should increase your heart rate and make you sweat (moderate-intensity exercise).  Try to do strengthening exercises at least twice each week. Do these in addition to the moderate-intensity exercise.  Spend less time sitting. Even light physical activity can be beneficial. Other tips  Work with your health care provider to achieve or maintain a healthy weight.  Do not use any products that contain nicotine or tobacco, such as cigarettes, e-cigarettes, and chewing tobacco. If you need help quitting, ask your health care provider.  Know your numbers. Ask your health care provider to check your cholesterol and your blood sugar (glucose). Continue to have your blood tested as directed by your health care provider. Do I need screening for   cancer? Depending on your health history and family history, you may need to have cancer screening at different stages of your life. This may include screening for:  Breast cancer.  Cervical cancer.  Lung cancer.  Colorectal cancer. What is my risk for osteoporosis? After menopause, you may be at increased  risk for osteoporosis. Osteoporosis is a condition in which bone destruction happens more quickly than new bone creation. To help prevent osteoporosis or the bone fractures that can happen because of osteoporosis, you may take the following actions:  If you are 19-50 years old, get at least 1,000 mg of calcium and at least 600 mg of vitamin D per day.  If you are older than age 50 but younger than age 70, get at least 1,200 mg of calcium and at least 600 mg of vitamin D per day.  If you are older than age 70, get at least 1,200 mg of calcium and at least 800 mg of vitamin D per day. Smoking and drinking excessive alcohol increase the risk of osteoporosis. Eat foods that are rich in calcium and vitamin D, and do weight-bearing exercises several times each week as directed by your health care provider. How does menopause affect my mental health? Depression may occur at any age, but it is more common as you become older. Common symptoms of depression include:  Low or sad mood.  Changes in sleep patterns.  Changes in appetite or eating patterns.  Feeling an overall lack of motivation or enjoyment of activities that you previously enjoyed.  Frequent crying spells. Talk with your health care provider if you think that you are experiencing depression. General instructions See your health care provider for regular wellness exams and vaccines. This may include:  Scheduling regular health, dental, and eye exams.  Getting and maintaining your vaccines. These include: ? Influenza vaccine. Get this vaccine each year before the flu season begins. ? Pneumonia vaccine. ? Shingles vaccine. ? Tetanus, diphtheria, and pertussis (Tdap) booster vaccine. Your health care provider may also recommend other immunizations. Tell your health care provider if you have ever been abused or do not feel safe at home. Summary  Menopause is a normal process in which your ability to get pregnant comes to an  end.  This condition causes hot flashes, night sweats, decreased interest in sex, mood swings, headaches, or lack of sleep.  Treatment for this condition may include hormone replacement therapy.  Take actions to keep yourself healthy, including exercising regularly, eating a healthy diet, watching your weight, and checking your blood pressure and blood sugar levels.  Get screened for cancer and depression. Make sure that you are up to date with all your vaccines. This information is not intended to replace advice given to you by your health care provider. Make sure you discuss any questions you have with your health care provider. Document Revised: 07/11/2018 Document Reviewed: 07/11/2018 Elsevier Patient Education  2020 [employer]vier Inc.  

## 2019-12-03 ENCOUNTER — Other Ambulatory Visit: Payer: Self-pay

## 2019-12-03 ENCOUNTER — Telehealth: Payer: Self-pay | Admitting: Physician Assistant

## 2019-12-03 ENCOUNTER — Emergency Department
Admission: EM | Admit: 2019-12-03 | Discharge: 2019-12-03 | Disposition: A | Discharge status: Home / Self Care | Payer: BC Managed Care – PPO | Attending: Emergency Medicine | Admitting: Emergency Medicine

## 2019-12-03 DIAGNOSIS — Z7984 Long term (current) use of oral hypoglycemic drugs: Secondary | ICD-10-CM | POA: Diagnosis not present

## 2019-12-03 DIAGNOSIS — S39012A Strain of muscle, fascia and tendon of lower back, initial encounter: Secondary | ICD-10-CM

## 2019-12-03 DIAGNOSIS — M1990 Unspecified osteoarthritis, unspecified site: Secondary | ICD-10-CM | POA: Diagnosis not present

## 2019-12-03 DIAGNOSIS — F17210 Nicotine dependence, cigarettes, uncomplicated: Secondary | ICD-10-CM | POA: Insufficient documentation

## 2019-12-03 DIAGNOSIS — M545 Low back pain, unspecified: Secondary | ICD-10-CM

## 2019-12-03 DIAGNOSIS — E119 Type 2 diabetes mellitus without complications: Secondary | ICD-10-CM | POA: Diagnosis not present

## 2019-12-03 MED ORDER — OXYCODONE-ACETAMINOPHEN 5-325 MG PO TABS: 1.0000 | ORAL_TABLET | ORAL | 0 refills | Status: DC | PRN

## 2019-12-03 MED ORDER — PREDNISONE 20 MG PO TABS: 40.0000 mg | ORAL_TABLET | Freq: Every day | ORAL | 0 refills | Status: DC

## 2019-12-03 MED ORDER — OXYCODONE-ACETAMINOPHEN 5-325 MG PO TABS
2.0000 | ORAL_TABLET | Freq: Once | ORAL | Status: AC
  Administered 2019-12-03: 19:00:00 2 via ORAL
  Filled 2019-12-03: qty 2

## 2019-12-03 NOTE — ED Notes (Signed)
Pt signed physical copy of paperwork and paperwork sent to records.

## 2019-12-03 NOTE — ED Triage Notes (Signed)
Pt comes via POV from home with c/o lower back pain. Pt states it is the muscle. Pt states she went to PCP and had a Toradol shot injection. Pt states nothing is helping.  Pt denies any recent injuries.

## 2019-12-03 NOTE — Telephone Encounter (Signed)
Pt stated that the injection for her back pain did not work well and she would like to try the next step that was discussed at her appt on yesterday. Please advise.

## 2019-12-03 NOTE — Telephone Encounter (Signed)
Patient was told to call back if the pain was not any better.  She stated that the doctor would call in some prednisone.  Please advise and call patient to confirm at [phone]

## 2019-12-03 NOTE — ED Provider Notes (Signed)
[facility] Emergency Department Provider Note  Time seen: 7:12 PM  I have reviewed the triage vital signs and the nursing notes.   HISTORY  Chief Complaint Back Pain   HPI Linda Bradford is a 64 y.o. female with a past medical history anxiety, arthritis, depression, diabetes, gastric reflux, hypertension, presents emergency department for low back pain.  According to the patient she has a history of low back pain which occurs intermittently.  Patient states it flared up 2 nights ago after painting around the house.  Patient denies any weakness or numbness of either leg denies any urinary or bowel incontinence.  Patient states she saw her doctor yesterday for routine physical and mention the pain at that time received a Toradol injection and muscle relaxers which she states have not helped.   Past Medical History:  Diagnosis Date  . Anxiety   . Arthritis   . Cancer ([place])    skin cancer, removed  . COPD (chronic obstructive pulmonary disease) ([place])   . Depression   . Diabetes mellitus without complication ([place])   . Dysrhythmia    occasional pvc's. not being treated.  . Fatigue   . GERD (gastroesophageal reflux disease)   . Headache   . Hypertension   . Hypothyroidism   . Thyroid disease     Patient Active Problem List   Diagnosis Date Noted  . Biliary dyskinesia   . Diverticulitis of large intestine with abscess 05/27/2018  . Diabetes mellitus without complication ([place]) 123456  . Lateral epicondylitis 07/07/2017  . Personal history of tobacco use, presenting hazards to health 05/03/2016  . Bronchitis, chronic ([place]) 03/03/2015  . Depression, major, recurrent, moderate ([place]) 11/05/2014  . Anxiety, generalized 11/05/2014  . Alcohol abuse, in remission 11/05/2014  . Nicotine addiction 11/05/2014  . H/O: osteoarthritis 11/05/2014  . Fibromyalgia 11/05/2014  . Restless leg syndrome 11/05/2014  . Arthralgia of hip 01/13/2014  . Adiposity 01/13/2014   . Disordered sleep 01/13/2014  . Airway hyperreactivity 12/17/2013  . Adult hypothyroidism 12/17/2013  . Compulsive tobacco user syndrome 12/17/2013  . Current tobacco use 12/17/2013  . Basal cell carcinoma 11/04/2013  . Arthropathia 02/08/2012  . Arthritis of knee, left 02/08/2012    Past Surgical History:  Procedure Laterality Date  . APPENDECTOMY    . CHOLECYSTECTOMY N/A 07/01/2019   Procedure: LAPAROSCOPIC CHOLECYSTECTOMY;  Surgeon: Olean Ree, MD;  Location: [facility] ORS;  Service: General;  Laterality: N/A;  . COLECTOMY WITH COLOSTOMY CREATION/HARTMANN PROCEDURE N/A 09/03/2018   Error, this was not done.  . COLONOSCOPY WITH PROPOFOL N/A 03/08/2019   Procedure: COLONOSCOPY WITH PROPOFOL;  Surgeon: Virgel Manifold, MD;  Location: [facility] ENDOSCOPY;  Service: Endoscopy;  Laterality: N/A;  . CYSTOSCOPY WITH STENT PLACEMENT Bilateral 09/03/2018   Procedure: CYSTOSCOPY WITH STENT PLACEMENT-LIGHTED STENTS;  Surgeon: Hollice Espy, MD;  Location: [facility] ORS;  Service: Urology;  Laterality: Bilateral;  . LAPAROSCOPIC SIGMOID COLECTOMY N/A 09/03/2018   Procedure: LAPAROSCOPIC SIGMOID COLECTOMY;  Surgeon: Olean Ree, MD;  Location: [facility] ORS;  Service: General;  Laterality: N/A;  . percutaneous drainage tube  07/2018   2nd tube placed. not healing in colon, causing a fistula  . TONSILLECTOMY    . TUBAL LIGATION      Prior to Admission medications   Medication Sig Start Date End Date Taking? Authorizing Provider  acetaminophen (TYLENOL) 500 MG tablet Take 1,000 mg by mouth every 6 (six) hours as needed for mild pain or moderate pain.     [provider]  benzonatate (TESSALON) 200 MG capsule TAKE 1 CAPSULE BY MOUTH 3 TIMES DAILY ASNEEDED 08/19/19   Mar Daring, PA-C  BREO ELLIPTA 200-25 MCG/INH AEPB INHALE 1 PUFF BY MOUTH ONCE DAILY 07/02/19   Mar Daring, PA-C  clonazePAM (KLONOPIN) 0.5 MG tablet TAKE 1/2 TABLET BY MOUTH TWICE DAILY AS NEEDED 10/08/19   Mar Daring, PA-C  diclofenac sodium (VOLTAREN) 1 % GEL Apply 4 g topically 4 (four) times daily as needed. Patient taking differently: Apply 4 g topically 4 (four) times daily as needed. Apply to knees 03/01/18   Mar Daring, PA-C  DULoxetine (CYMBALTA) 60 MG capsule TAKE 1 CAPSULE BY MOUTH TWICE DAILY 10/12/19   Mar Daring, PA-C  ergocalciferol (VITAMIN D2) 1.25 MG (50000 UT) capsule Take 1 capsule (50,000 Units total) by mouth once a week. Once a week on Wednesday 03/14/19   Mar Daring, PA-C  ezetimibe (ZETIA) 10 MG tablet TAKE 1 TABLET DAILY Patient taking differently: Take 10 mg by mouth daily.  05/20/19   Mar Daring, PA-C  fluticasone (FLONASE) 50 MCG/ACT nasal spray USE 2 SPRAYS IN EACH NOSTRIL ONCE DAILY 07/02/19   Mar Daring, PA-C  gabapentin (NEURONTIN) 300 MG capsule TAKE 3 CAPSULES BY MOUTH ONCE EVERY MORNING AND 4 CAPSULES AT BEDTIME Patient taking differently: Take 1,200 mg by mouth at bedtime.  06/04/19   Mar Daring, PA-C  glucose blood (CONTOUR NEXT TEST) test strip To check blood sugar once daily 06/09/17   Mar Daring, PA-C  ibuprofen (ADVIL) 600 MG tablet Take 1 tablet (600 mg total) by mouth every 8 (eight) hours as needed for mild pain or moderate pain. 07/01/19   Piscoya, Jacqulyn Bath, MD  ipratropium-albuterol (DUONEB) 0.5-2.5 (3) MG/3ML SOLN Take 3 mLs by nebulization every 6 (six) hours as needed. 11/30/18   Mar Daring, PA-C  levothyroxine (SYNTHROID) 75 MCG tablet TAKE 1 TABLET DAILY ON AN EMPTY STOMACH, WAIT 30 MINUTES BEFORE TAKING OTHER MEDICATIONS Patient taking differently: Take 75 mcg by mouth daily before breakfast.  05/20/19   Burnette, Clearnce Sorrel, PA-C  meclizine (ANTIVERT) 25 MG tablet Take 1 tablet (25 mg total) by mouth 3 (three) times daily as needed for dizziness. 05/15/18   Mar Daring, PA-C  meloxicam (MOBIC) 15 MG tablet TAKE 1 TABLET BY MOUTH ONCE DAILY Patient taking differently: Take 15  mg by mouth daily.  05/01/19   Mar Daring, PA-C  metFORMIN (GLUCOPHAGE-XR) 500 MG 24 hr tablet TAKE 2 TABLETS BY MOUTH ONCE DAILY WITH SUPPER 08/15/19   Mar Daring, PA-C  metoprolol succinate (TOPROL-XL) 50 MG 24 hr tablet Take 1 tablet (50 mg total) by mouth daily. Take with or immediately following a meal. 05/08/19   Burnette, Clearnce Sorrel, PA-C  nitrofurantoin, macrocrystal-monohydrate, (MACROBID) 100 MG capsule TAKE 1 CAPSULE BY MOUTH AT BEDTIME 10/12/19   Mar Daring, PA-C  nystatin (MYCOSTATIN) 100000 UNIT/ML suspension TAKE 1 TEASPOON BY MOUTH 4 TIMES DAILY 09/05/19   Mar Daring, PA-C  ondansetron (ZOFRAN) 4 MG tablet TAKE 1 TABLET BY MOUTH EVERY 8 HOURS AS NEEDED 08/15/19   Mar Daring, PA-C  phenazopyridine (PYRIDIUM) 100 MG tablet Take 1 tablet (100 mg total) by mouth 3 (three) times daily as needed for pain. 04/16/19   Mar Daring, PA-C  polyethylene glycol (MIRALAX / GLYCOLAX) packet Take 17 g by mouth daily. 06/02/18   Tylene Fantasia, PA-C  PROAIR HFA 108 [phone] Base) MCG/ACT inhaler INHALE 1  PUFF BY MOUTH EVERY 4 HOURS AS NEEDED Patient taking differently: Inhale 1 puff into the lungs every 4 (four) hours as needed for wheezing or shortness of breath.  05/03/19   Mar Daring, PA-C  pyridOXINE (VITAMIN B-6) 100 MG tablet Take 100 mg by mouth daily.    [provider]  THEO-24 300 MG 24 hr capsule TAKE 1 CAPSULE BY MOUTH ONCE DAILY 07/02/19   Mar Daring, PA-C  tiZANidine (ZANAFLEX) 4 MG tablet TAKE 1 TABLET AT BEDTIME AS NEEDED FOR MUSCLE SPASMS Patient taking differently: Take 4 mg by mouth at bedtime.  05/24/19   Mar Daring, PA-C  vitamin B-12 (CYANOCOBALAMIN) 1000 MCG tablet Take 1,000 mcg by mouth daily.    [provider]  gabapentin (NEURONTIN) 300 MG capsule TAKE 3 CAPSULES BY MOUTH ONCE EVERY MORNING AND 4 CAPSULES AT BEDTIME Patient taking differently: Take 900-1,200 mg by mouth See admin  instructions. 900 mg every morning and 1200 mg at bedtime 01/28/19   Mar Daring, PA-C    Allergies  Allergen Reactions  . Dexilant [Dexlansoprazole] Nausea Only and Other (See Comments)    Dizziness    Family History  Problem Relation Age of Onset  . Lung cancer Mother   . Alcohol abuse Sister   . Arthritis Sister   . Alcohol abuse Brother   . Heart disease Brother 66  . Heart attack Brother   . Pancreatic cancer Maternal Aunt   . Diabetes Maternal Aunt   . Alcohol abuse Son   . Drug abuse Son   . Drug abuse Son   . Heart disease Father 49  . Diabetes Sister   . Breast cancer Brother   . Alcohol abuse Maternal Uncle   . Alcohol abuse Maternal Grandmother   . Diabetes Maternal Grandmother     Social History Social History   Tobacco Use  . Smoking status: Current Every Day Smoker    Packs/day: 1.50    Years: 45.00    Pack years: 67.50    Types: Cigarettes    Start date: 03/02/1980  . Smokeless tobacco: Never Used  Substance Use Topics  . Alcohol use: No    Alcohol/week: 0.0 standard drinks  . Drug use: No    Review of Systems Constitutional: Negative for fever. Cardiovascular: Negative for chest pain. Respiratory: Negative for shortness of breath. Gastrointestinal: Negative for abdominal pain Musculoskeletal: Low back pain. Neurological: Negative for headache All other ROS negative  ____________________________________________   PHYSICAL EXAM:  VITAL SIGNS: ED Triage Vitals  Enc Vitals Group     BP 12/03/19 1822 (!) 160/57     Pulse Rate 12/03/19 1822 77     Resp 12/03/19 1822 17     Temp 12/03/19 1822 98.5 F (36.9 C)     Temp src --      SpO2 12/03/19 1822 95 %     Weight --      Height --      Head Circumference --      Peak Flow --      Pain Score 12/03/19 1821 8     Pain Loc --      Pain Edu? --      Excl. in [place]? --     Constitutional: Alert and oriented. Well appearing and in no distress. Eyes: Normal exam ENT      Head:  Normocephalic and atraumatic.      Mouth/Throat: Mucous membranes are moist. Cardiovascular: Normal rate, regular rhythm. Respiratory:  Normal respiratory effort without tachypnea nor retractions. Breath sounds are clear  Gastrointestinal: Soft and nontender. No distention.   Musculoskeletal: Mild to moderate lumbar paraspinal tenderness to palpation without significant midline tenderness. Neurologic:  Normal speech and language. No gross focal neurologic deficits.  Denies any sensory deficits to the bilateral legs. Skin:  Skin is warm, dry and intact.  Psychiatric: Mood and affect are normal.     INITIAL IMPRESSION / ASSESSMENT AND PLAN / ED COURSE  Pertinent labs & imaging results that were available during my care of the patient were reviewed by me and considered in my medical decision making (see chart for details).   Patient presents to the emergency department for low back pain.  Overall the patient appears well, no distress.  No weakness numbness or incontinence issues.  Patient states a history of similar events in the past, states she was painting over the weekend which she believes is what flared the back up.  Discussed return precautions.  Will discharge with short course of pain medication.  Patient agreeable to plan of care.  Linda Bradford was evaluated in Emergency Department on 12/03/2019 for the symptoms described in the history of present illness. She was evaluated in the context of the global COVID-19 pandemic, which necessitated consideration that the patient might be at risk for infection with the SARS-CoV-2 virus that causes COVID-19. Institutional protocols and algorithms that pertain to the evaluation of patients at risk for COVID-19 are in a state of rapid change based on information released by regulatory bodies including the CDC and federal and state organizations. These policies and algorithms were followed during the patient's care in the  ED.  ____________________________________________   FINAL CLINICAL IMPRESSION(S) / ED DIAGNOSES  Low back pain   Harvest Dark, MD 12/03/19 1914

## 2019-12-04 MED ORDER — METHYLPREDNISOLONE 4 MG PO TBPK: ORAL_TABLET | ORAL | 0 refills | Status: DC

## 2019-12-04 NOTE — Telephone Encounter (Signed)
Medrol (methylprednisolone) sent in to [facility]

## 2019-12-04 NOTE — Telephone Encounter (Signed)
Pt advised.   Thanks,   -Darice Vicario  

## 2019-12-11 IMAGING — CT CT CHEST LUNG CANCER SCREENING LOW DOSE W/O CM
2 of 5 series · 15 of 40 positions shown, 18 images · non-contrast
Comparison: Low-dose lung cancer screening chest CT 05/16/2017.

CLINICAL DATA: 62-year-old female current smoker with 47 pack year
history of smoking. Lung cancer screening examination.

EXAM:
CT CHEST WITHOUT CONTRAST LOW-DOSE FOR LUNG CANCER SCREENING
TECHNIQUE: Multidetector CT imaging of the chest was performed following the
standard protocol without IV contrast.

[Series 3: lung · axial · 0.68mm/px · z∈[-1295,-1000]mm · 12 of 329 slices shown, 15 images (1 of 2)]
[im 17/329  mediastinal]
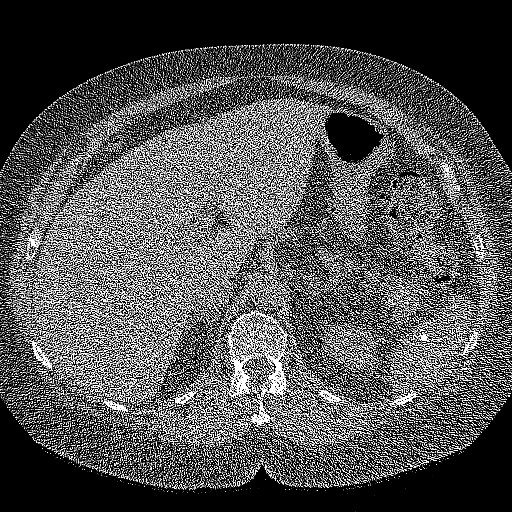
[im 17/329  lung]
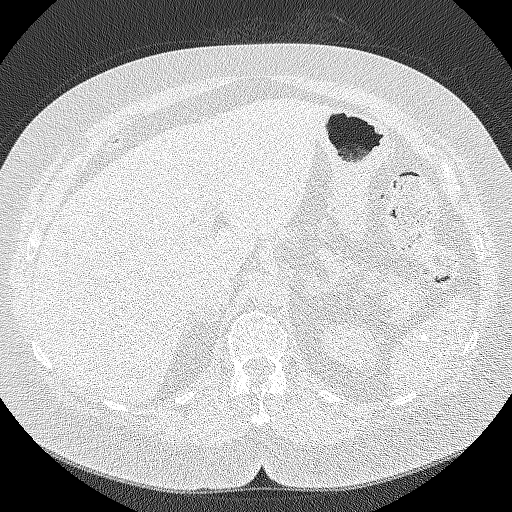
[im 50/329  lung]
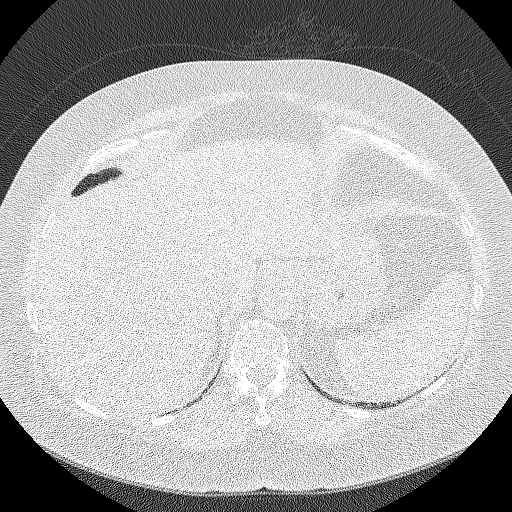
[im 66/329  lung]
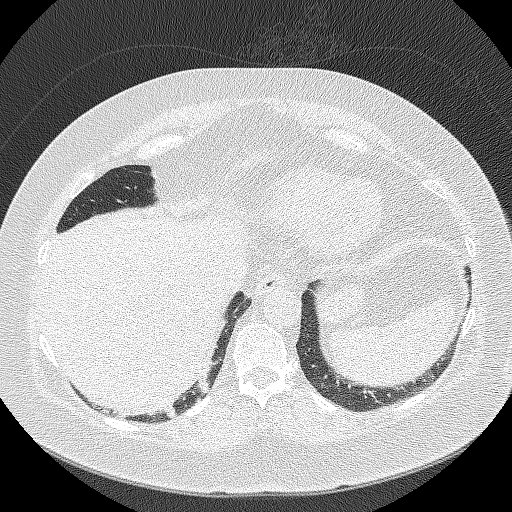
[im 99/329  lung]
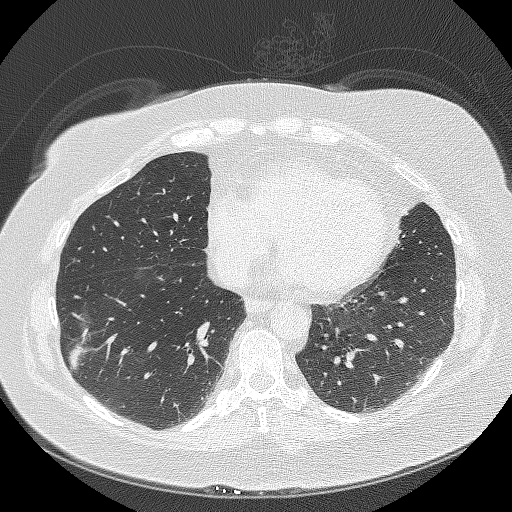
[im 132/329  mediastinal]
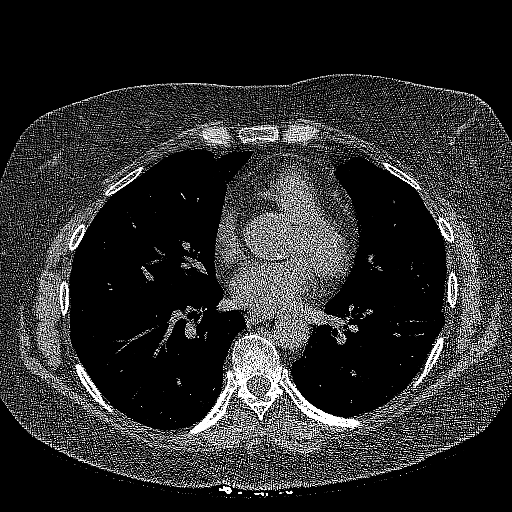
[im 132/329  lung]
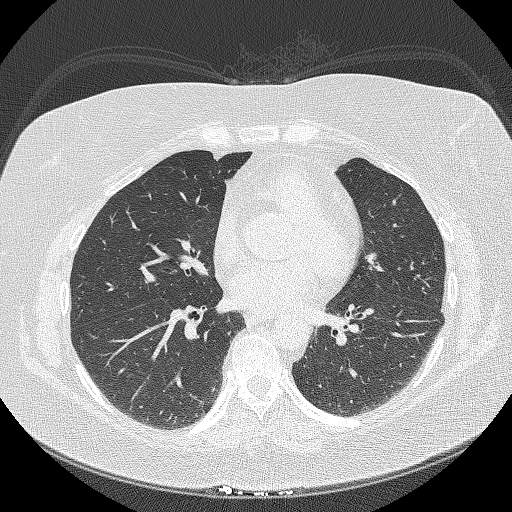
[im 148/329  lung]
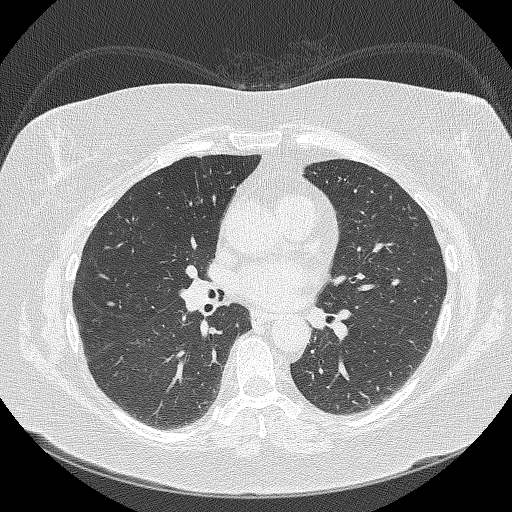
[im 181/329  lung]
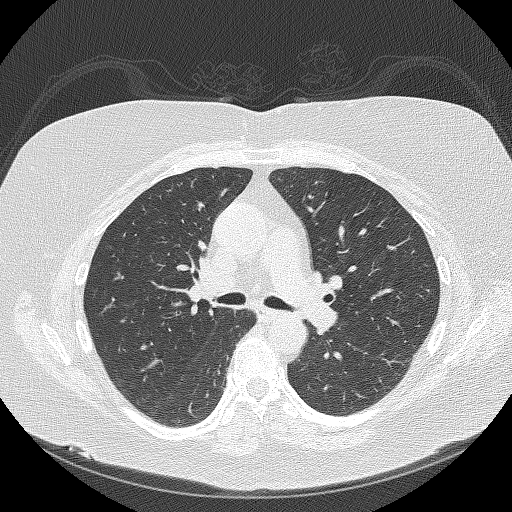
[im 197/329  lung]
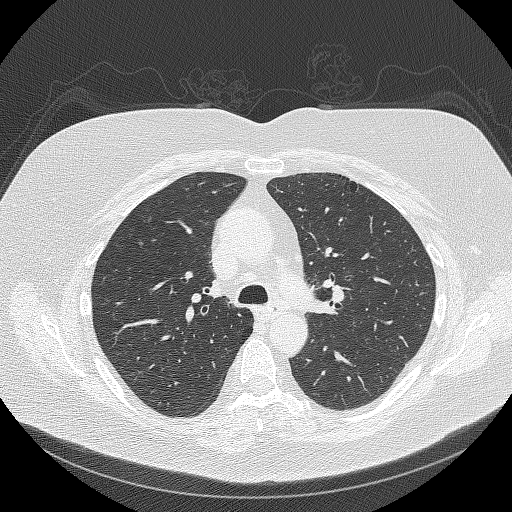
[im 230/329  mediastinal]
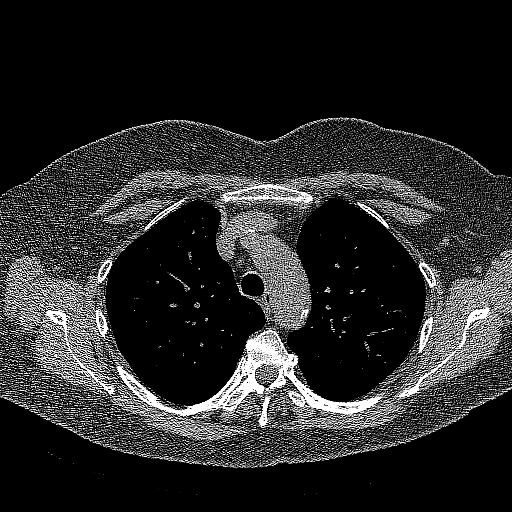
[im 230/329  lung]
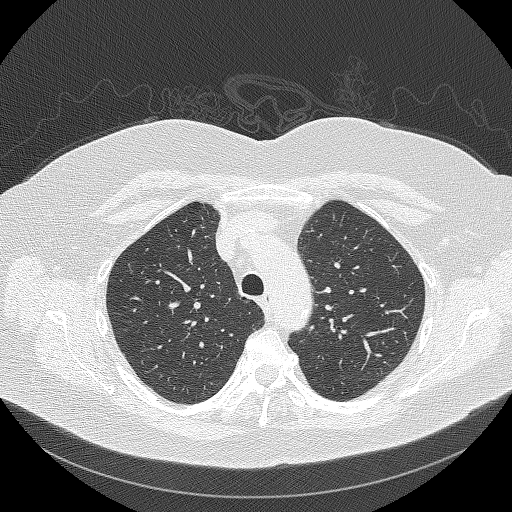
[im 263/329  lung]
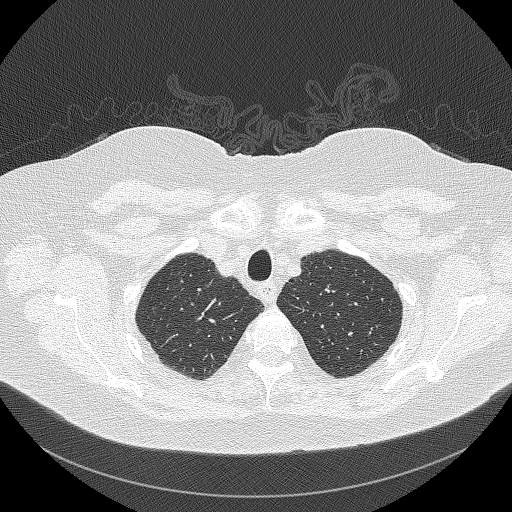
[im 279/329  lung]
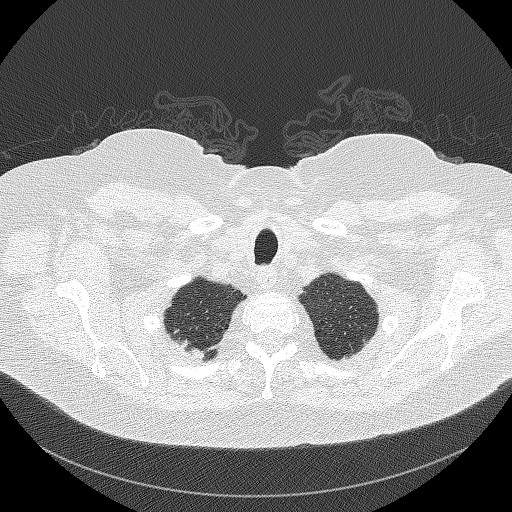
[im 312/329  lung]
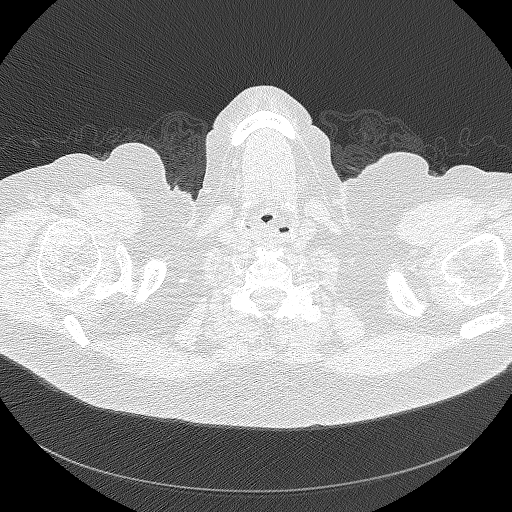

[Series 4: lung · coronal · 0.64mm/px · 3 of 348 slices shown (2 of 2)]
[im 70/348  lung]
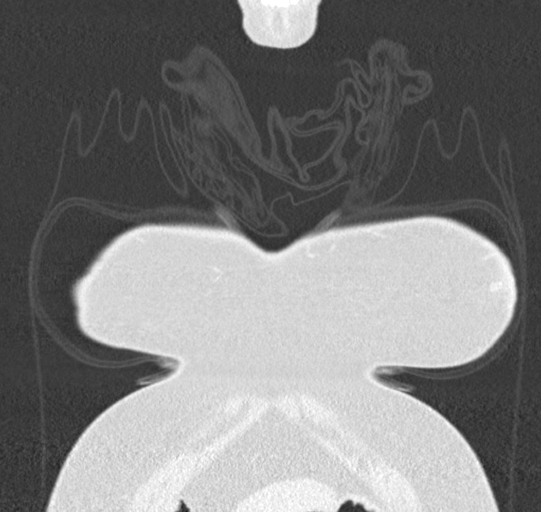
[im 139/348  lung]
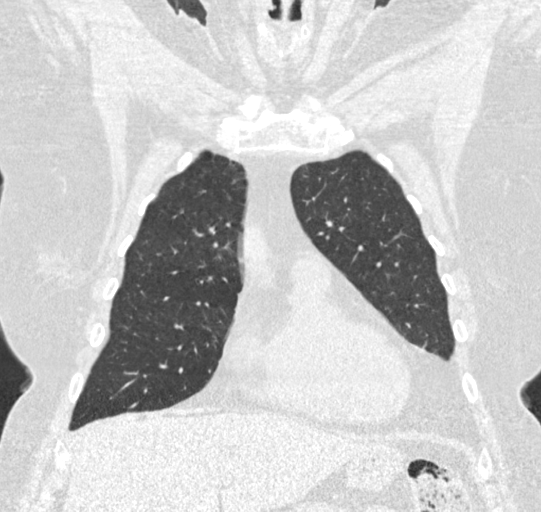
[im 209/348  lung]
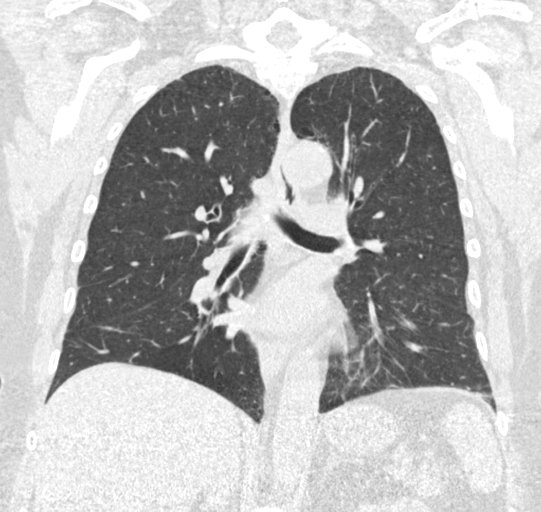

[15 of 40 positions shown; findings below may reference images not displayed]

FINDINGS: Cardiovascular: Heart size is normal. There is no significant
pericardial fluid, thickening or pericardial calcification. There is
aortic atherosclerosis, as well as atherosclerosis of the great
vessels of the mediastinum and the coronary arteries, including
calcified atherosclerotic plaque in the right coronary artery.

Mediastinum/Nodes: No pathologically enlarged mediastinal or hilar
lymph nodes. Please note that accurate exclusion of hilar adenopathy
is limited on noncontrast CT scans. Esophagus is unremarkable in
appearance. No axillary lymphadenopathy.

Lungs/Pleura: Multiple pulmonary nodules are noted throughout the
lungs, the largest of which is a new nodule in the right upper lobe
(axial image 88 of series 3), with a volume derived mean diameter of
5.0 mm. No other larger more suspicious appearing pulmonary nodules
or masses are noted. No acute consolidative airspace disease. No
pleural effusions. Mild diffuse bronchial wall thickening with mild
centrilobular and paraseptal emphysema.

Upper Abdomen: Aortic atherosclerosis.

Musculoskeletal: There are no aggressive appearing lytic or blastic
lesions noted in the visualized portions of the skeleton.
IMPRESSION: 1. Lung-RADS 3S, probably benign findings. Short-term follow-up in 6
months is recommended with repeat low-dose chest CT without contrast
(please use the following order, "CT CHEST LCS NODULE FOLLOW-UP W/O
CM").
2. The "S" modifier above refers to potentially clinically
significant non lung cancer related findings. Specifically, there is
aortic atherosclerosis, in addition to right coronary artery
disease. Please note that although the presence of coronary artery
calcium documents the presence of coronary artery disease, the
severity of this disease and any potential stenosis cannot be
assessed on this non-gated CT examination. Assessment for potential
risk factor modification, dietary therapy or pharmacologic therapy
may be warranted, if clinically indicated.
3. Mild diffuse bronchial wall thickening with mild centrilobular
and paraseptal emphysema; imaging findings suggestive of underlying
COPD.

Aortic Atherosclerosis (XHKRZ-I97.7) and Emphysema ([id number].5).

## 2019-12-31 ENCOUNTER — Ambulatory Visit: Payer: Self-pay

## 2019-12-31 ENCOUNTER — Encounter: Payer: Self-pay | Admitting: Adult Health

## 2019-12-31 ENCOUNTER — Telehealth: Payer: Self-pay

## 2019-12-31 ENCOUNTER — Other Ambulatory Visit: Payer: Self-pay | Admitting: Adult Health

## 2019-12-31 ENCOUNTER — Telehealth (INDEPENDENT_AMBULATORY_CARE_PROVIDER_SITE_OTHER): Payer: BC Managed Care – PPO | Admitting: Adult Health

## 2019-12-31 VITALS — Temp 99.5°F

## 2019-12-31 DIAGNOSIS — R059 Cough, unspecified: Secondary | ICD-10-CM

## 2019-12-31 DIAGNOSIS — B379 Candidiasis, unspecified: Secondary | ICD-10-CM | POA: Insufficient documentation

## 2019-12-31 DIAGNOSIS — J441 Chronic obstructive pulmonary disease with (acute) exacerbation: Secondary | ICD-10-CM | POA: Diagnosis not present

## 2019-12-31 DIAGNOSIS — Z20822 Contact with and (suspected) exposure to covid-19: Secondary | ICD-10-CM | POA: Insufficient documentation

## 2019-12-31 DIAGNOSIS — T3695XA Adverse effect of unspecified systemic antibiotic, initial encounter: Secondary | ICD-10-CM

## 2019-12-31 DIAGNOSIS — R05 Cough: Secondary | ICD-10-CM

## 2019-12-31 MED ORDER — HYDROCOD POLST-CPM POLST ER 10-8 MG/5ML PO SUER: 5.0000 mL | Freq: Every evening | ORAL | 0 refills | Status: DC | PRN

## 2019-12-31 MED ORDER — AMOXICILLIN-POT CLAVULANATE 875-125 MG PO TABS: 1.0000 | ORAL_TABLET | Freq: Two times a day (BID) | ORAL | 0 refills | Status: DC

## 2019-12-31 MED ORDER — FLUCONAZOLE 150 MG PO TABS: 150.0000 mg | ORAL_TABLET | Freq: Once | ORAL | 0 refills | Status: AC

## 2019-12-31 MED ORDER — PREDNISONE 10 MG PO TABS: ORAL_TABLET | ORAL | 0 refills | Status: DC

## 2019-12-31 NOTE — Progress Notes (Signed)
MyChart Video Visit    Virtual Visit via Video Note   This visit type was conducted due to national recommendations for restrictions regarding the COVID-19 Pandemic (e.g. social distancing) in an effort to limit this patient's exposure and mitigate transmission in our community. This patient is at least at moderate risk for complications without adequate follow up. This format is felt to be most appropriate for this patient at this time. Physical exam was limited by quality of the video and audio technology used for the visit.   Patient location: at  Home  Provider location: Provider: Provider's office at  [facility], [place].      Patient: Linda Bradford   DOB: 1955-12-09   64 y.o. Female  MRN: [id number] Visit Date: 12/31/2019  Today's healthcare provider: Marcille Buffy, FNP   Chief Complaint  Patient presents with  . Cough    Virtual Visit   Subjective    Cough This is a recurrent problem. The current episode started in the past 7 days. The problem has been gradually worsening. The cough is productive of purulent sputum. Associated symptoms include chills, headaches, heartburn, myalgias, nasal congestion, postnasal drip, rhinorrhea, shortness of breath, sweats and wheezing. Pertinent negatives include no chest pain, ear congestion, ear pain, fever, hemoptysis, rash, sore throat or weight loss. Nothing aggravates the symptoms. She has tried steroid inhaler (mucinex) for the symptoms. Her past medical history is significant for bronchitis, COPD and pneumonia. There is no history of asthma, bronchiectasis, emphysema or environmental allergies.    She says she is feeling better with body aches after ibuprofen just as needed. Temperature 99 is the highest she has gotten and this is without fever reducers. Ibuprofen hurts her stomach usually so she is just taking as needed. She has had the occasional chill.   Productive sputum - yellow color intermittent  and is clear at times.  She has wheezing - Breo inhaler is helping.her. She has been using Pro air as needed and not excessive. She reports she is able to ambulate normal distances in her home. Inhalers have improved wheezing she has chest tightness with wheezing and chest congestion.   Onset  was 12/28/19 no known exposures. She reports she did have diarrhea last Thursday and other than that she has not had any other illness. She still has had occasional diarrhea with this cold as well.   No rectal bleeding or dark stools. Denies any mucous in stools.  Tessalon pearles are not helping her cough. She is not sleeping good.   She had Covid vaccine 2nd one was in April and she denies any side effects with that. She did not receive Wynetta Emery and [employer]. She will bring card when she returns to the office.   She denies and leg pain or recent surgeries or flights. She denies any one in her home being sick currently.  She has sinus pain and pressure that has worsened over the weekend. She has had sinus congestion prior.   She reports history of sinusitis and she usually gets this each year around this time with her COPD, she reports azithromycin works but she reports it [place] takes two packs, she has to take two of them but does best with Augmentin. Denies any hemoptysis.    Patient  denies any fever, body aches,chills, rash, chest pain, shortness of breath, nausea, vomiting, or diarrhea.   Denies dizziness, lightheadedness, pre syncopal or syncopal episodes.   Patient Active Problem List   Diagnosis  Date Noted  . Chronic obstructive pulmonary disease with acute exacerbation ([place]) 12/31/2019  . Cough 12/31/2019  . Suspected COVID-19 virus infection 12/31/2019  . Antibiotic-induced yeast infection- history of  12/31/2019  . Biliary dyskinesia   . Diverticulitis of large intestine with abscess 05/27/2018  . Diabetes mellitus without complication ([place]) 123456  . Lateral  epicondylitis 07/07/2017  . Personal history of tobacco use, presenting hazards to health 05/03/2016  . Bronchitis, chronic ([place]) 03/03/2015  . Depression, major, recurrent, moderate ([place]) 11/05/2014  . Anxiety, generalized 11/05/2014  . Alcohol abuse, in remission 11/05/2014  . Nicotine addiction 11/05/2014  . H/O: osteoarthritis 11/05/2014  . Fibromyalgia 11/05/2014  . Restless leg syndrome 11/05/2014  . Arthralgia of hip 01/13/2014  . Adiposity 01/13/2014  . Disordered sleep 01/13/2014  . Airway hyperreactivity 12/17/2013  . Adult hypothyroidism 12/17/2013  . Compulsive tobacco user syndrome 12/17/2013  . Current tobacco use 12/17/2013  . Basal cell carcinoma 11/04/2013  . Arthropathia 02/08/2012  . Arthritis of knee, left 02/08/2012   Past Medical History:  Diagnosis Date  . Anxiety   . Arthritis   . Cancer ([place])    skin cancer, removed  . COPD (chronic obstructive pulmonary disease) ([place])   . Depression   . Diabetes mellitus without complication ([place])   . Dysrhythmia    occasional pvc's. not being treated.  . Fatigue   . GERD (gastroesophageal reflux disease)   . Headache   . Hypertension   . Hypothyroidism   . Thyroid disease    Social History   Tobacco Use  . Smoking status: Current Every Day Smoker    Packs/day: 1.50    Years: 45.00    Pack years: 67.50    Types: Cigarettes    Start date: 03/02/1980  . Smokeless tobacco: Never Used  Substance Use Topics  . Alcohol use: No    Alcohol/week: 0.0 standard drinks  . Drug use: No   Allergies  Allergen Reactions  . Dexilant [Dexlansoprazole] Nausea Only and Other (See Comments)    Dizziness      Medications: Outpatient Medications Prior to Visit  Medication Sig  . acetaminophen (TYLENOL) 500 MG tablet Take 1,000 mg by mouth every 6 (six) hours as needed for mild pain or moderate pain.   Marland Kitchen BREO ELLIPTA 200-25 MCG/INH AEPB INHALE 1 PUFF BY MOUTH ONCE DAILY  . clonazePAM (KLONOPIN) 0.5 MG tablet TAKE 1/2  TABLET BY MOUTH TWICE DAILY AS NEEDED  . diclofenac sodium (VOLTAREN) 1 % GEL Apply 4 g topically 4 (four) times daily as needed. (Patient taking differently: Apply 4 g topically 4 (four) times daily as needed. Apply to knees)  . DULoxetine (CYMBALTA) 60 MG capsule TAKE 1 CAPSULE BY MOUTH TWICE DAILY  . ergocalciferol (VITAMIN D2) 1.25 MG (50000 UT) capsule Take 1 capsule (50,000 Units total) by mouth once a week. Once a week on Wednesday  . ezetimibe (ZETIA) 10 MG tablet TAKE 1 TABLET DAILY (Patient taking differently: Take 10 mg by mouth daily. )  . fluticasone (FLONASE) 50 MCG/ACT nasal spray USE 2 SPRAYS IN EACH NOSTRIL ONCE DAILY  . gabapentin (NEURONTIN) 300 MG capsule TAKE 3 CAPSULES BY MOUTH ONCE EVERY MORNING AND 4 CAPSULES AT BEDTIME (Patient taking differently: Take 1,200 mg by mouth at bedtime. )  . glucose blood (CONTOUR NEXT TEST) test strip To check blood sugar once daily  . ibuprofen (ADVIL) 600 MG tablet Take 1 tablet (600 mg total) by mouth every 8 (eight) hours as needed for mild  pain or moderate pain.  Marland Kitchen ipratropium-albuterol (DUONEB) 0.5-2.5 (3) MG/3ML SOLN Take 3 mLs by nebulization every 6 (six) hours as needed.  Marland Kitchen levothyroxine (SYNTHROID) 75 MCG tablet TAKE 1 TABLET DAILY ON AN EMPTY STOMACH, WAIT 30 MINUTES BEFORE TAKING OTHER MEDICATIONS (Patient taking differently: Take 75 mcg by mouth daily before breakfast. )  . meclizine (ANTIVERT) 25 MG tablet Take 1 tablet (25 mg total) by mouth 3 (three) times daily as needed for dizziness.  . meloxicam (MOBIC) 15 MG tablet TAKE 1 TABLET BY MOUTH ONCE DAILY (Patient taking differently: Take 15 mg by mouth daily. )  . metFORMIN (GLUCOPHAGE-XR) 500 MG 24 hr tablet TAKE 2 TABLETS BY MOUTH ONCE DAILY WITH SUPPER  . metoprolol succinate (TOPROL-XL) 50 MG 24 hr tablet Take 1 tablet (50 mg total) by mouth daily. Take with or immediately following a meal.  . nitrofurantoin, macrocrystal-monohydrate, (MACROBID) 100 MG capsule TAKE 1 CAPSULE BY  MOUTH AT BEDTIME  . nystatin (MYCOSTATIN) 100000 UNIT/ML suspension TAKE 1 TEASPOON BY MOUTH 4 TIMES DAILY  . ondansetron (ZOFRAN) 4 MG tablet TAKE 1 TABLET BY MOUTH EVERY 8 HOURS AS NEEDED  . phenazopyridine (PYRIDIUM) 100 MG tablet Take 1 tablet (100 mg total) by mouth 3 (three) times daily as needed for pain.  . polyethylene glycol (MIRALAX / GLYCOLAX) packet Take 17 g by mouth daily.  Marland Kitchen PROAIR HFA 108 (90 Base) MCG/ACT inhaler INHALE 1 PUFF BY MOUTH EVERY 4 HOURS AS NEEDED (Patient taking differently: Inhale 1 puff into the lungs every 4 (four) hours as needed for wheezing or shortness of breath. )  . pyridOXINE (VITAMIN B-6) 100 MG tablet Take 100 mg by mouth daily.  Marland Kitchen THEO-24 300 MG 24 hr capsule TAKE 1 CAPSULE BY MOUTH ONCE DAILY  . tiZANidine (ZANAFLEX) 4 MG tablet TAKE 1 TABLET AT BEDTIME AS NEEDED FOR MUSCLE SPASMS (Patient taking differently: Take 4 mg by mouth at bedtime. )  . vitamin B-12 (CYANOCOBALAMIN) 1000 MCG tablet Take 1,000 mcg by mouth daily.  . [DISCONTINUED] benzonatate (TESSALON) 200 MG capsule TAKE 1 CAPSULE BY MOUTH 3 TIMES DAILY ASNEEDED  . [DISCONTINUED] methylPREDNISolone (MEDROL) 4 MG TBPK tablet 6 day taper; take as directed on package instructions  . [DISCONTINUED] oxyCODONE-acetaminophen (PERCOCET) 5-325 MG tablet Take 1 tablet by mouth every 4 (four) hours as needed for severe pain.  . [DISCONTINUED] predniSONE (DELTASONE) 20 MG tablet Take 2 tablets (40 mg total) by mouth daily.   No facility-administered medications prior to visit.    Review of Systems  Constitutional: Positive for chills. Negative for activity change, appetite change, diaphoresis, fatigue, fever and weight loss.  HENT: Positive for postnasal drip and rhinorrhea. Negative for congestion, dental problem, ear pain, sore throat, tinnitus and trouble swallowing.   Respiratory: Positive for cough, chest tightness, shortness of breath and wheezing. Negative for apnea, hemoptysis, choking and  stridor.   Cardiovascular: Negative for chest pain, palpitations and leg swelling.  Gastrointestinal: Positive for heartburn.  Genitourinary: Negative.   Musculoskeletal: Positive for myalgias.  Skin: Negative.  Negative for rash.  Allergic/Immunologic: Negative for environmental allergies.  Neurological: Positive for headaches.  Psychiatric/Behavioral: Negative.     Last CBC Lab Results  Component Value Date   WBC 7.0 07/06/2019   HGB 14.4 07/06/2019   HCT 43.5 07/06/2019   MCV 94.4 07/06/2019   MCH 31.2 07/06/2019   RDW 12.5 07/06/2019   PLT 257 [id number]   Last metabolic panel Lab Results  Component Value Date   GLUCOSE 143 (  H) 07/06/2019   NA 138 07/06/2019   K 3.8 07/06/2019   CL 100 07/06/2019   CO2 28 07/06/2019   BUN 14 07/06/2019   CREATININE 0.53 07/06/2019   GFRNONAA >60 07/06/2019   GFRAA >60 07/06/2019   CALCIUM 9.2 07/06/2019   PROT 7.5 07/06/2019   ALBUMIN 3.9 07/06/2019   BILITOT 0.5 07/06/2019   ALKPHOS 95 07/06/2019   AST 20 07/06/2019   ALT 20 07/06/2019   ANIONGAP 10 07/06/2019      Objective    Temp 99.5 F (37.5 C) (Oral)  SpO2 Readings from Last 3 Encounters:  12/03/19 95%  07/16/19 95%  07/06/19 93%      Physical Exam  No vital signs available.  Patient is alert and oriented and responsive to questions Engages in conversation with provider. Speaks in full sentences without any pauses without any shortness of breath or distress.     Assessment & Plan     COPD with acute exacerbation ([place]) - Plan: amoxicillin-clavulanate (AUGMENTIN) 875-125 MG tablet, predniSONE (DELTASONE) 10 MG tablet  Chronic obstructive pulmonary disease with acute exacerbation ([facility]) - Plan: chlorpheniramine-HYDROcodone (TUSSIONEX PENNKINETIC ER) 10-8 MG/5ML SUER  Cough - Plan: amoxicillin-clavulanate (AUGMENTIN) 875-125 MG tablet, predniSONE (DELTASONE) 10 MG tablet, chlorpheniramine-HYDROcodone (TUSSIONEX PENNKINETIC ER) 10-8 MG/5ML SUER   Antibiotic-induced yeast infection- history of  - Plan: fluconazole (DIFLUCAN) 150 MG tablet  Suspected COVID-19 virus infection  Meds ordered this encounter  Medications  . amoxicillin-clavulanate (AUGMENTIN) 875-125 MG tablet    Sig: Take 1 tablet by mouth 2 (two) times daily.    Dispense:  20 tablet    Refill:  0  . predniSONE (DELTASONE) 10 MG tablet    Sig: PO: Take 6 tablets on day 1:Take 5 tablets day 2:Take 4 tablets day 3: Take 3 tablets day 4:Take 2 tablets day five: 5 Take 1 tablet day 6    Dispense:  21 tablet    Refill:  0  . fluconazole (DIFLUCAN) 150 MG tablet    Sig: Take 1 tablet (150 mg total) by mouth once for 1 dose. If needed for antibiotic induced yeast infection.    Dispense:  1 tablet    Refill:  0  . chlorpheniramine-HYDROcodone (TUSSIONEX PENNKINETIC ER) 10-8 MG/5ML SUER    Sig: Take 5 mLs by mouth at bedtime as needed for cough.    Dispense:  140 mL    Refill:  0  She requests Tussionex  -reports she will only take it at night as needed for cough. She says benzonatate is not helping. She is aware will cause drowsiness.,  Mucinex over the counter per package instructions is advised.   Diflucan given if antibiotic induced yeast infection occurs.   Recommend obtaining a pulse oximetry and monitoring and parameters given.  Given Covid 19 pandemic, do recommend her having covid  test and instructions for testing and available options discussed. Patient verbalized understanding.  If any symptoms worsen, persist or change at anytime, recommend being seen in person for in person evaluation at anytime in medical facility seeing in person respiratory symptoms.  Return in about 3 days (around 01/03/2020), or if symptoms worsen or fail to improve, for at any time for any worsening symptoms, Go to Emergency room/ urgent care if worse. Advised patient call the office or your primary care doctor for an appointment if no improvement within 72 hours or if any symptoms change  or worsen at any time  Advised ER or urgent Care if after hours or  on weekend. Call 911 for emergency symptoms at any time.Patinet verbalized understanding of all instructions given/reviewed and treatment plan and has no further questions or concerns at this time.     I discussed the assessment and treatment plan with the patient. The patient was provided an opportunity to ask questions and all were answered. The patient agreed with the plan and demonstrated an understanding of the instructions.   The patient was advised to call back or seek an in-person evaluation if the symptoms worsen or if the condition fails to improve as anticipated.  I provided 23 minutes of non-face-to-face time during this encounter.  The entirety of the information documented in the History of Present Illness, Review of Systems and Physical Exam were personally obtained by me. Portions of this information were initially documented by the CMA and reviewed by me for thoroughness and accuracy.   Wellington [place] Colbe Viviano, FNP   Marcille Buffy, [place] [phone] (phone) [phone] (fax)  [place]

## 2019-12-31 NOTE — Telephone Encounter (Signed)
Pt. Reports she started feeling bad this past Friday. Productive cough, runny nose, shortness of breath(has COPD),body aches, loss of taste, diarrhea. No fever. Has had both COVID 19 vaccines. Virtual appointment made.  Reason for Disposition . [1] Continuous (nonstop) coughing interferes with work or school AND [2] no improvement using cough treatment per Care Advice  Answer Assessment - Initial Assessment Questions 1. ONSET: "When did the cough begin?"      Friday 2. SEVERITY: "How bad is the cough today?"      Moderate - severe 3. RESPIRATORY DISTRESS: "Describe your breathing."      Shortness of breath 4. FEVER: "Do you have a fever?" If so, ask: "What is your temperature, how was it measured, and when did it start?"     No 5. SPUTUM: "Describe the color of your sputum" (clear, white, yellow, green)     Clear to greenish 6. HEMOPTYSIS: "Are you coughing up any blood?" If so ask: "How much?" (flecks, streaks, tablespoons, etc.)     No 7. CARDIAC HISTORY: "Do you have any history of heart disease?" (e.g., heart attack, congestive heart failure)      Palpitations in the past 8. LUNG HISTORY: "Do you have any history of lung disease?"  (e.g., pulmonary embolus, asthma, emphysema)     COPD 9. PE RISK FACTORS: "Do you have a history of blood clots?" (or: recent major surgery, recent prolonged travel, bedridden)     No 10. OTHER SYMPTOMS: "Do you have any other symptoms?" (e.g., runny nose, wheezing, chest pain)       Runny nose , wheezing, loss of taste 11. PREGNANCY: "Is there any chance you are pregnant?" "When was your last menstrual period?"       No 12. TRAVEL: "Have you traveled out of the country in the last month?" (e.g., travel history, exposures)       No  Protocols used: [place]

## 2019-12-31 NOTE — Telephone Encounter (Signed)
Patient called office stating that there was no prescription for Tussionex, I see in chart that this prescription was printed, I cannot call this into pharmacy will you please resend. Thanks. KW

## 2019-12-31 NOTE — Telephone Encounter (Signed)
Meds ordered this encounter  Medications   chlorpheniramine-HYDROcodone (TUSSIONEX PENNKINETIC ER) 10-8 MG/5ML SUER    Sig: Take 5 mLs by mouth at bedtime as needed for cough.    Dispense:  140 mL    Refill:  0   My apologies it did print, sent electronically. Please inform patient.

## 2019-12-31 NOTE — Progress Notes (Signed)
Script printed for cough syrup was shredded and new E script sent.  Meds ordered this encounter  Medications  . chlorpheniramine-HYDROcodone (TUSSIONEX PENNKINETIC ER) 10-8 MG/5ML SUER    Sig: Take 5 mLs by mouth at bedtime as needed for cough.    Dispense:  140 mL    Refill:  0

## 2019-12-31 NOTE — Telephone Encounter (Signed)
Visit in progress. KW

## 2019-12-31 NOTE — Patient Instructions (Signed)
Covid Testing Instructions for [facility]:  If you/your practice has a patient that needs an appointment, please have the patient text "COVID" to [phone], OR they can log on to [email or web address] to easily make an on-line appointment. Please note:  If you have a patient who does not have access to a smart phone or PC, you or the patient can call [phone] to get assistance.  If you have a patient that needs a test and appointments are full, please send a message to the [facility] or call [phone]. A provider may request that we overbook additional tests per day.  Chronic Obstructive Pulmonary Disease Exacerbation Chronic obstructive pulmonary disease (COPD) is a long-term (chronic) lung problem. In COPD, the flow of air from the lungs is limited. COPD exacerbations are times that breathing gets worse and you need more than your normal treatment. Without treatment, they can be life threatening. If they happen often, your lungs can become more damaged. If your COPD gets worse, your doctor may treat you with:  Medicines.  Oxygen.  Different ways to clear your airway, such as using a mask. Follow these instructions at home: Medicines  Take over-the-counter and prescription medicines only as told by your doctor.  If you take an antibiotic or steroid medicine, do not stop taking the medicine even if you start to feel better.  Keep up with shots (vaccinations) as told by your doctor. Be sure to get a yearly (annual) flu shot. Lifestyle  Do not smoke. If you need help quitting, ask your doctor.  Eat healthy foods.  Exercise regularly.  Get plenty of sleep.  Avoid tobacco smoke and other things that can bother your lungs.  Wash your hands often with soap and water. This will help keep you from getting an infection. If you cannot use soap and water, use hand sanitizer.  During flu season, avoid areas that are crowded with people. General  instructions  Drink enough fluid to keep your pee (urine) clear or pale yellow. Do not do this if your doctor has told you not to.  Use a cool mist machine (vaporizer).  If you use oxygen or a machine that turns medicine into a mist (nebulizer), continue to use it as told.  Follow all instructions for rehabilitation. These are steps you can take to make your body work better.  Keep all follow-up visits as told by your doctor. This is important. Contact a doctor if:  Your COPD symptoms get worse than normal. Get help right away if:  You are short of breath and it gets worse.  You have trouble talking.  You have chest pain.  You cough up blood.  You have a fever.  You keep throwing up (vomiting).  You feel weak or you pass out (faint).  You feel confused.  You are not able to sleep because of your symptoms.  You are not able to do daily activities. Summary  COPD exacerbations are times that breathing gets worse and you need more treatment than normal.  COPD exacerbations can be very serious and may cause your lungs to become more damaged.  Do not smoke. If you need help quitting, ask your doctor.  Stay up-to-date on your shots. Get a flu shot every year. This information is not intended to replace advice given to you by your health care provider. Make sure you discuss any questions you have with your health care provider. Document Revised: 06/30/2017 Document Reviewed: 08/22/2016 Elsevier Patient Education  [phone]  [employer].

## 2020-01-01 ENCOUNTER — Other Ambulatory Visit: Payer: Self-pay | Admitting: Physician Assistant

## 2020-01-01 DIAGNOSIS — G629 Polyneuropathy, unspecified: Secondary | ICD-10-CM

## 2020-01-14 ENCOUNTER — Other Ambulatory Visit: Payer: Self-pay | Admitting: Physician Assistant

## 2020-01-14 DIAGNOSIS — J411 Mucopurulent chronic bronchitis: Secondary | ICD-10-CM

## 2020-01-14 MED ORDER — ALBUTEROL SULFATE HFA 108 (90 BASE) MCG/ACT IN AERS: INHALATION_SPRAY | RESPIRATORY_TRACT | 3 refills | Status: DC

## 2020-01-14 NOTE — Telephone Encounter (Signed)
[facility] faxed refill request for the following medications:  PROAIR HFA 108 (90 Base) MCG/ACT inhaler  Last Rx: 05/03/2019 LOV: 12/31/2019 w/Michelle NOV: 06/04/2020 Please advise. Thanks TNP

## 2020-01-15 ENCOUNTER — Other Ambulatory Visit: Payer: Self-pay | Admitting: Physician Assistant

## 2020-01-15 DIAGNOSIS — J441 Chronic obstructive pulmonary disease with (acute) exacerbation: Secondary | ICD-10-CM

## 2020-01-15 MED ORDER — IPRATROPIUM-ALBUTEROL 0.5-2.5 (3) MG/3ML IN SOLN: 3.0000 mL | Freq: Four times a day (QID) | RESPIRATORY_TRACT | 11 refills | Status: DC | PRN

## 2020-01-15 NOTE — Telephone Encounter (Signed)
[facility] faxed refill request for the following medications:  ipratropium-albuterol (DUONEB) 0.5-2.5 (3) MG/3ML SOLN   Last Rx: 11/30/2018 with 11 refills LOV: 12/31/2019 with Sharyn Lull Please advise. Thanks TNP

## 2020-01-21 ENCOUNTER — Other Ambulatory Visit: Payer: Self-pay | Admitting: Physician Assistant

## 2020-01-21 DIAGNOSIS — E039 Hypothyroidism, unspecified: Secondary | ICD-10-CM

## 2020-01-21 DIAGNOSIS — G250 Essential tremor: Secondary | ICD-10-CM

## 2020-01-21 DIAGNOSIS — G629 Polyneuropathy, unspecified: Secondary | ICD-10-CM

## 2020-01-21 DIAGNOSIS — I10 Essential (primary) hypertension: Secondary | ICD-10-CM

## 2020-01-21 DIAGNOSIS — E119 Type 2 diabetes mellitus without complications: Secondary | ICD-10-CM

## 2020-01-21 DIAGNOSIS — F32A Depression, unspecified: Secondary | ICD-10-CM

## 2020-01-21 MED ORDER — METOPROLOL SUCCINATE ER 50 MG PO TB24: 50.0000 mg | ORAL_TABLET | Freq: Every day | ORAL | 0 refills | Status: DC

## 2020-01-21 MED ORDER — GABAPENTIN 300 MG PO CAPS: ORAL_CAPSULE | ORAL | 0 refills | Status: DC

## 2020-01-21 MED ORDER — LEVOTHYROXINE SODIUM 75 MCG PO TABS: ORAL_TABLET | ORAL | 0 refills | Status: DC

## 2020-01-21 MED ORDER — METFORMIN HCL ER 500 MG PO TB24: ORAL_TABLET | ORAL | 0 refills | Status: DC

## 2020-01-21 MED ORDER — DULOXETINE HCL 60 MG PO CPEP: 60.0000 mg | ORAL_CAPSULE | Freq: Two times a day (BID) | ORAL | 0 refills | Status: DC

## 2020-01-21 NOTE — Telephone Encounter (Signed)
Patient is requesting a weeks worth of medication as she is on her way to [place] for vacation and forgot medications.    metFORMIN (GLUCOPHAGE-XR) 500 MG 24 hr tablet DULoxetine (CYMBALTA) 60 MG capsule gabapentin (NEURONTIN) 300 MG capsule levothyroxine (SYNTHROID) 75 MCG tablet  And her blood pressure medication, which she does not know the name of.    Pharmacy:  [place]), [place] - [phone] Phone:  [phone]  Fax:  [phone]

## 2020-01-21 NOTE — Telephone Encounter (Signed)
Pt also needs the metoprolol succinate (TOPROL-XL) 50 MG 24 hr tablet  7 days worth/7 tabs  [place]), [place] - [phone] Phone:  [phone]  Fax:  [phone]

## 2020-01-21 NOTE — Telephone Encounter (Signed)
Patient is requesting 1 week of medications she is on vacation and forgot them- Courtesy RF sent for patient

## 2020-02-04 ENCOUNTER — Other Ambulatory Visit: Payer: Self-pay | Admitting: Physician Assistant

## 2020-02-04 DIAGNOSIS — F32A Depression, unspecified: Secondary | ICD-10-CM

## 2020-02-12 ENCOUNTER — Other Ambulatory Visit: Payer: Self-pay | Admitting: Physician Assistant

## 2020-02-12 DIAGNOSIS — N390 Urinary tract infection, site not specified: Secondary | ICD-10-CM

## 2020-02-12 NOTE — Telephone Encounter (Signed)
Requested medication (s) are due for refill today: no  Requested medication (s) are on the active medication list: yes  Last refill:  12/03/2019  Future visit scheduled: yes  Notes to clinic:   Medication not assigned to a protocol, review manually  Requested Prescriptions  Pending Prescriptions Disp Refills   nitrofurantoin, macrocrystal-monohydrate, (MACROBID) 100 MG capsule [Pharmacy Med Name: NITROFURANTOIN MONOHYD MACRO 100 MG] 90 capsule 0    Sig: TAKE 1 CAPSULE BY MOUTH AT BEDTIME      Off-Protocol Failed - 02/12/2020  1:11 PM      Failed - Medication not assigned to a protocol, review manually.      Passed - Valid encounter within last 12 months    Recent Outpatient Visits           1 month ago COPD with acute exacerbation [facility])   [place], Kelby Aline, FNP   2 months ago Annual physical exam   [facility] Fenton Malling M, [place]   9 months ago Recurrent UTI   [facility] Fenton Malling M, [place]   10 months ago Recurrent UTI   [facility] Fenton Malling M, [place]   11 months ago [place], Clearnce Sorrel, [place]       Future Appointments             In 3 months [place], Clearnce Sorrel, PA-C [employer], [place]

## 2020-02-19 LAB — HM DIABETES EYE EXAM

## 2020-02-20 ENCOUNTER — Encounter: Payer: Self-pay | Admitting: Physician Assistant

## 2020-02-24 ENCOUNTER — Telehealth: Payer: Self-pay | Admitting: *Deleted

## 2020-02-24 DIAGNOSIS — Z87891 Personal history of nicotine dependence: Secondary | ICD-10-CM

## 2020-02-24 DIAGNOSIS — Z122 Encounter for screening for malignant neoplasm of respiratory organs: Secondary | ICD-10-CM

## 2020-02-24 NOTE — Telephone Encounter (Signed)
Patient has been notified that annual lung cancer screening low dose CT scan is due currently or will be in near future. Confirmed that patient is within the age range of 55-77, and asymptomatic, (no signs or symptoms of lung cancer). Patient denies illness that would prevent curative treatment for lung cancer if found. Verified smoking history, (current, 48 pack year). The shared decision making visit was done 05/03/16. Patient is agreeable for CT scan being scheduled.

## 2020-03-04 ENCOUNTER — Ambulatory Visit
Admission: RE | Admit: 2020-03-04 | Discharge: 2020-03-04 | Disposition: A | Discharge status: Home / Self Care | Payer: BC Managed Care – PPO | Source: Ambulatory Visit | Attending: Nurse Practitioner | Admitting: Nurse Practitioner

## 2020-03-04 ENCOUNTER — Other Ambulatory Visit: Payer: Self-pay

## 2020-03-04 DIAGNOSIS — Z87891 Personal history of nicotine dependence: Secondary | ICD-10-CM | POA: Diagnosis not present

## 2020-03-04 DIAGNOSIS — Z122 Encounter for screening for malignant neoplasm of respiratory organs: Secondary | ICD-10-CM | POA: Insufficient documentation

## 2020-03-09 ENCOUNTER — Encounter: Payer: Self-pay | Admitting: *Deleted

## 2020-03-16 ENCOUNTER — Other Ambulatory Visit: Payer: Self-pay | Admitting: Physician Assistant

## 2020-03-16 DIAGNOSIS — J418 Mixed simple and mucopurulent chronic bronchitis: Secondary | ICD-10-CM

## 2020-03-16 NOTE — Telephone Encounter (Signed)
Requested Prescriptions  Pending Prescriptions Disp Refills  . BREO ELLIPTA 200-25 MCG/INH AEPB [Pharmacy Med Name: BREO ELLIPTA 200-25 MCG/INH INH AEP] 60 each 5    Sig: INHALE 1 PUFF BY MOUTH ONCE DAILY     Pulmonology:  [employer] Passed - 03/16/2020 11:55 AM      Passed - Valid encounter within last 12 months    Recent Outpatient Visits          2 months ago COPD with acute exacerbation [facility])   [place], Kelby Aline, FNP   3 months ago Annual physical exam   [facility] Fenton Malling M, [place]   10 months ago Recurrent UTI   [facility] Fenton Malling M, [place]   11 months ago Recurrent UTI   [facility], Clearnce Sorrel, [place]   1 year ago [place], Clearnce Sorrel, [place]      Future Appointments            In 3 days [place], Clearnce Sorrel, PA-C [employer], [place]   In 2 months Marlyn Corporal, Clearnce Sorrel, PA-C [employer], [place]

## 2020-03-17 NOTE — Progress Notes (Signed)
Established patient visit   Patient: Linda Bradford   DOB: 10-08-1955   64 y.o. Female  MRN: [id number] Visit Date: 03/19/2020  Today's healthcare provider: Mar Daring, PA-C   Chief Complaint  Patient presents with  . Anxiety   Subjective    HPI  Depression and Anxiety, Follow-up  She  was last seen for this 3 months ago. Changes made at last visit include none condition was stable on Duloxetine 60mg .   She reports excellent compliance with treatment. She is not having side effects.   Current symptoms include: depressed mood, fatigue, hopelessness, insomnia and crying, agitation, irritability She feels she is Worse since last visit. She reports that she has been taking more of the Clonazepam.   Depression screen [facility] 2/9 03/19/2020 12/02/2019 04/18/2019  Decreased Interest 3 0 0  Down, Depressed, Hopeless 3 0 0  PHQ - 2 Score 6 0 0  Altered sleeping 3 0 0  Tired, decreased energy 3 0 1  Change in appetite 3 0 1  Feeling bad or failure about yourself  2 0 0  Trouble concentrating 0 0 0  Moving slowly or fidgety/restless 0 0 0  Suicidal thoughts 0 0 0  PHQ-9 Score 17 0 2  Difficult doing work/chores Somewhat difficult Not difficult at all Not difficult at all    ----------------------------------------------------------------------------------------- GAD 7 : Generalized Anxiety Score 03/19/2020 10/10/2016  Nervous, Anxious, on Edge 2 2  Control/stop worrying 3 0  Worry too much - different things 3 0  Trouble relaxing 2 2  Restless 1 0  Easily annoyed or irritable 3 2  Afraid - awful might happen 3 1  Total GAD 7 Score 17 7  Anxiety Difficulty Somewhat difficult Not difficult at all   Diabetes Mellitus Type II, Follow-up  Lab Results  Component Value Date   HGBA1C 7.0 (A) 03/19/2020   HGBA1C 6.5 (A) 08/17/2018   HGBA1C 7.2 (A) 06/13/2018   Wt Readings from Last 3 Encounters:  03/19/20 227 lb 9.6 oz (103.2 kg)  03/04/20 225 lb (102.1 kg)  12/02/19 226  lb 12.8 oz (102.9 kg)   Last seen for diabetes 11 months ago.  Management since then includes continue Metformin XR 500mg . She reports excellent compliance with treatment. She is not having side effects.  Symptoms: Yes fatigue No foot ulcerations  No appetite changes No nausea  No paresthesia of the feet  No polydipsia  No polyuria No visual disturbances   No vomiting     Home blood sugar records: not being checked  Episodes of hypoglycemia? No not being checked.   Current insulin regiment: none Eye exam:See scanned report  Pertinent Labs: Lab Results  Component Value Date   CHOL 265 (H) 05/19/2017   HDL 62 05/19/2017   LDLCALC 167 (H) 05/19/2017   TRIG 204 (H) 05/19/2017   CHOLHDL 4.3 05/19/2017   Lab Results  Component Value Date   NA 138 07/06/2019   K 3.8 07/06/2019   CREATININE 0.53 07/06/2019   GFRNONAA >60 07/06/2019   GFRAA >60 07/06/2019   GLUCOSE 143 (H) 07/06/2019     --------------------------------------------------------------------------------------------------- Patient Active Problem List   Diagnosis Date Noted  . Chronic obstructive pulmonary disease with acute exacerbation ([place]) 12/31/2019  . Cough 12/31/2019  . Suspected COVID-19 virus infection 12/31/2019  . Antibiotic-induced yeast infection- history of  12/31/2019  . Biliary dyskinesia   . Diverticulitis of large intestine with abscess 05/27/2018  . Diabetes mellitus without complication ([place]) 67/67/2094  .  Lateral epicondylitis 07/07/2017  . Personal history of tobacco use, presenting hazards to health 05/03/2016  . Bronchitis, chronic ([place]) 03/03/2015  . Depression, major, recurrent, moderate ([place]) 11/05/2014  . Anxiety, generalized 11/05/2014  . Alcohol abuse, in remission 11/05/2014  . Nicotine addiction 11/05/2014  . H/O: osteoarthritis 11/05/2014  . Fibromyalgia 11/05/2014  . Restless leg syndrome 11/05/2014  . Arthralgia of hip 01/13/2014  . Adiposity 01/13/2014  . Disordered  sleep 01/13/2014  . Airway hyperreactivity 12/17/2013  . Adult hypothyroidism 12/17/2013  . Compulsive tobacco user syndrome 12/17/2013  . Current tobacco use 12/17/2013  . Basal cell carcinoma 11/04/2013  . Arthropathia 02/08/2012  . Arthritis of knee, left 02/08/2012   Past Medical History:  Diagnosis Date  . Anxiety   . Arthritis   . Cancer ([place])    skin cancer, removed  . COPD (chronic obstructive pulmonary disease) ([place])   . Depression   . Diabetes mellitus without complication ([place])   . Dysrhythmia    occasional pvc's. not being treated.  . Fatigue   . GERD (gastroesophageal reflux disease)   . Headache   . Hypertension   . Hypothyroidism   . Thyroid disease        Medications: Outpatient Medications Prior to Visit  Medication Sig  . acetaminophen (TYLENOL) 500 MG tablet Take 1,000 mg by mouth every 6 (six) hours as needed for mild pain or moderate pain.   Marland Kitchen BREO ELLIPTA 200-25 MCG/INH AEPB INHALE 1 PUFF BY MOUTH ONCE DAILY  . clonazePAM (KLONOPIN) 0.5 MG tablet TAKE 1/2 TABLET BY MOUTH TWICE DAILY AS NEEDED  . diclofenac sodium (VOLTAREN) 1 % GEL Apply 4 g topically 4 (four) times daily as needed. (Patient taking differently: Apply 4 g topically 4 (four) times daily as needed. Apply to knees)  . DULoxetine (CYMBALTA) 60 MG capsule TAKE 1 CAPSULE BY MOUTH TWICE DAILY  . ergocalciferol (VITAMIN D2) 1.25 MG (50000 UT) capsule Take 1 capsule (50,000 Units total) by mouth once a week. Once a week on Wednesday  . ezetimibe (ZETIA) 10 MG tablet TAKE 1 TABLET DAILY (Patient taking differently: Take 10 mg by mouth daily. )  . fluticasone (FLONASE) 50 MCG/ACT nasal spray USE 2 SPRAYS IN EACH NOSTRIL ONCE DAILY  . glucose blood (CONTOUR NEXT TEST) test strip To check blood sugar once daily  . ibuprofen (ADVIL) 600 MG tablet Take 1 tablet (600 mg total) by mouth every 8 (eight) hours as needed for mild pain or moderate pain.  Marland Kitchen ipratropium-albuterol (DUONEB) 0.5-2.5 (3) MG/3ML  SOLN Take 3 mLs by nebulization every 6 (six) hours as needed.  Marland Kitchen levothyroxine (SYNTHROID) 75 MCG tablet TAKE 1 TABLET DAILY ON AN EMPTY STOMACH, WAIT 30 MINUTES BEFORE TAKING OTHER MEDICATIONS  . meclizine (ANTIVERT) 25 MG tablet Take 1 tablet (25 mg total) by mouth 3 (three) times daily as needed for dizziness.  . meloxicam (MOBIC) 15 MG tablet TAKE 1 TABLET BY MOUTH ONCE DAILY (Patient taking differently: Take 15 mg by mouth daily. )  . metoprolol succinate (TOPROL-XL) 50 MG 24 hr tablet Take 1 tablet (50 mg total) by mouth daily. Take with or immediately following a meal.  . nitrofurantoin, macrocrystal-monohydrate, (MACROBID) 100 MG capsule TAKE 1 CAPSULE BY MOUTH AT BEDTIME  . nystatin (MYCOSTATIN) 100000 UNIT/ML suspension TAKE 1 TEASPOON BY MOUTH 4 TIMES DAILY  . ondansetron (ZOFRAN) 4 MG tablet TAKE 1 TABLET BY MOUTH EVERY 8 HOURS AS NEEDED  . polyethylene glycol (MIRALAX / GLYCOLAX) packet Take 17 g by  mouth daily.  Marland Kitchen pyridOXINE (VITAMIN B-6) 100 MG tablet Take 100 mg by mouth daily.  Marland Kitchen THEO-24 300 MG 24 hr capsule TAKE 1 CAPSULE BY MOUTH ONCE DAILY  . tiZANidine (ZANAFLEX) 4 MG tablet TAKE 1 TABLET AT BEDTIME AS NEEDED FOR MUSCLE SPASMS (Patient taking differently: Take 4 mg by mouth at bedtime. )  . vitamin B-12 (CYANOCOBALAMIN) 1000 MCG tablet Take 1,000 mcg by mouth daily.  . [DISCONTINUED] chlorpheniramine-HYDROcodone (TUSSIONEX PENNKINETIC ER) 10-8 MG/5ML SUER Take 5 mLs by mouth at bedtime as needed for cough.  . [DISCONTINUED] gabapentin (NEURONTIN) 300 MG capsule TAKE 3 CAPSULES BY MOUTH ONCE EVERY MORNING & 4 CAPSULES AT BEDTIME  . [DISCONTINUED] metFORMIN (GLUCOPHAGE-XR) 500 MG 24 hr tablet TAKE 2 TABLETS BY MOUTH ONCE DAILY WITH SUPPER  . [DISCONTINUED] albuterol (PROAIR HFA) 108 (90 Base) MCG/ACT inhaler INHALE 1 PUFF BY MOUTH EVERY 4 HOURS AS NEEDED  . [DISCONTINUED] amoxicillin-clavulanate (AUGMENTIN) 875-125 MG tablet Take 1 tablet by mouth 2 (two) times daily.  .  [DISCONTINUED] phenazopyridine (PYRIDIUM) 100 MG tablet Take 1 tablet (100 mg total) by mouth 3 (three) times daily as needed for pain.  . [DISCONTINUED] predniSONE (DELTASONE) 10 MG tablet PO: Take 6 tablets on day 1:Take 5 tablets day 2:Take 4 tablets day 3: Take 3 tablets day 4:Take 2 tablets day five: 5 Take 1 tablet day 6   No facility-administered medications prior to visit.    Review of Systems  Constitutional: Positive for fatigue.  Respiratory: Negative.   Cardiovascular: Negative.   Endocrine: Negative.   Psychiatric/Behavioral: Positive for decreased concentration, dysphoric mood and sleep disturbance. The patient is nervous/anxious.     Last CBC Lab Results  Component Value Date   WBC 7.0 07/06/2019   HGB 14.4 07/06/2019   HCT 43.5 07/06/2019   MCV 94.4 07/06/2019   MCH 31.2 07/06/2019   RDW 12.5 07/06/2019   PLT 257 76/16/0737   Last metabolic panel Lab Results  Component Value Date   GLUCOSE 143 (H) 07/06/2019   NA 138 07/06/2019   K 3.8 07/06/2019   CL 100 07/06/2019   CO2 28 07/06/2019   BUN 14 07/06/2019   CREATININE 0.53 07/06/2019   GFRNONAA >60 07/06/2019   GFRAA >60 07/06/2019   CALCIUM 9.2 07/06/2019   PROT 7.5 07/06/2019   ALBUMIN 3.9 07/06/2019   BILITOT 0.5 07/06/2019   ALKPHOS 95 07/06/2019   AST 20 07/06/2019   ALT 20 07/06/2019   ANIONGAP 10 07/06/2019      Objective    BP 129/82 (BP Location: Left Arm, Patient Position: Sitting, Cuff Size: Large)   Pulse 79   Temp 98 F (36.7 C) (Oral)   Resp 16   Wt 227 lb 9.6 oz (103.2 kg)   BMI 31.74 kg/m  BP Readings from Last 3 Encounters:  03/19/20 129/82  12/03/19 (!) 156/66  12/02/19 140/82   Wt Readings from Last 3 Encounters:  03/19/20 227 lb 9.6 oz (103.2 kg)  03/04/20 225 lb (102.1 kg)  12/02/19 226 lb 12.8 oz (102.9 kg)      Physical Exam Vitals reviewed.  Constitutional:      General: She is not in acute distress.    Appearance: Normal appearance. She is  well-developed. She is obese. She is not ill-appearing or diaphoretic.  Cardiovascular:     Rate and Rhythm: Normal rate and regular rhythm.     Pulses: Normal pulses.     Heart sounds: Normal heart sounds. No murmur heard.  No  friction rub. No gallop.   Pulmonary:     Effort: Pulmonary effort is normal. No respiratory distress.     Breath sounds: Normal breath sounds. No wheezing or rales.  Musculoskeletal:     Cervical back: Normal range of motion and neck supple.     Right lower leg: No edema.     Left lower leg: No edema.  Neurological:     Mental Status: She is alert.  Psychiatric:        Attention and Perception: Attention and perception normal.        Mood and Affect: Mood is anxious and depressed. Affect is tearful.        Speech: Speech normal.        Behavior: Behavior normal. Behavior is cooperative.        Thought Content: Thought content normal.      Results for orders placed or performed in visit on 03/19/20  POCT glycosylated hemoglobin (Hb A1C)  Result Value Ref Range   Hemoglobin A1C 7.0 (A) 4.0 - 5.6 %   Est. average glucose Bld gHb Est-mCnc 154     Assessment & Plan     1. Diabetes mellitus without complication (HCC) Stable. A1c still at goal at 7.0. Diagnosis pulled for medication refill. Continue current medical treatment plan. - metFORMIN (GLUCOPHAGE-XR) 500 MG 24 hr tablet; TAKE 2 TABLETS BY MOUTH ONCE DAILY WITH SUPPER  Dispense: 90 tablet; Refill: 1  2. Neuropathy Stable. Diagnosis pulled for medication refill. Continue current medical treatment plan. - gabapentin (NEURONTIN) 300 MG capsule; TAKE 3 CAPSULES BY MOUTH ONCE EVERY MORNING & 5 CAPSULES AT BEDTIME  Dispense: 240 capsule; Refill: 5  3. Encounter for smoking cessation counseling Wants to try to quit smoking again. Will give Nicoderm patches as below. [place] quit line.  - nicotine (NICODERM CQ - DOSED IN MG/24 HOURS) 21 mg/24hr patch; Place 1 patch (21 mg total) onto the skin daily.  Dispense:  28 patch; Refill: 0  4. Primary insomnia Worsening sleep issues. Will start trazodone as below. May titrate dose up to 100mg  nightly if needed. F/U in 4-6 weeks.  - traZODone (DESYREL) 50 MG tablet; Take 0.5-2 tablets (25-100 mg total) by mouth at bedtime as needed for sleep.  Dispense: 60 tablet; Refill: 1  5. Depression, major, recurrent, moderate ([place]) Will start wellbutrin as below. Continue Duloxetine 60mg . F/U in 4-6 weeks. Call if adverse effects occur.  - buPROPion (WELLBUTRIN XL) 150 MG 24 hr tablet; Take 1 tablet (150 mg total) by mouth daily.  Dispense: 30 tablet; Refill: 1  6. Anxiety, generalized See above medical treatment plan. - buPROPion (WELLBUTRIN XL) 150 MG 24 hr tablet; Take 1 tablet (150 mg total) by mouth daily.  Dispense: 30 tablet; Refill: 1  7. Tobacco use disorder, continuous Starting nicotine patches.    Return in about 4 weeks (around 04/16/2020) for smoking, depression/anxiety, sleep.      Reynolds Bowl, PA-C, have reviewed all documentation for this visit. The documentation on 03/24/20 for the exam, diagnosis, procedures, and orders are all accurate and complete.   Rubye Beach  [facility] [phone] (phone) [phone] (fax)  [place]

## 2020-03-19 ENCOUNTER — Other Ambulatory Visit: Payer: Self-pay

## 2020-03-19 ENCOUNTER — Ambulatory Visit (INDEPENDENT_AMBULATORY_CARE_PROVIDER_SITE_OTHER): Payer: BC Managed Care – PPO | Admitting: Physician Assistant

## 2020-03-19 ENCOUNTER — Encounter: Payer: Self-pay | Admitting: Physician Assistant

## 2020-03-19 VITALS — BP 129/82 | HR 79 | Temp 98.0°F | Resp 16 | Wt 227.6 lb

## 2020-03-19 DIAGNOSIS — F5101 Primary insomnia: Secondary | ICD-10-CM

## 2020-03-19 DIAGNOSIS — E119 Type 2 diabetes mellitus without complications: Secondary | ICD-10-CM

## 2020-03-19 DIAGNOSIS — G629 Polyneuropathy, unspecified: Secondary | ICD-10-CM | POA: Diagnosis not present

## 2020-03-19 DIAGNOSIS — F331 Major depressive disorder, recurrent, moderate: Secondary | ICD-10-CM

## 2020-03-19 DIAGNOSIS — F17209 Nicotine dependence, unspecified, with unspecified nicotine-induced disorders: Secondary | ICD-10-CM

## 2020-03-19 DIAGNOSIS — F411 Generalized anxiety disorder: Secondary | ICD-10-CM

## 2020-03-19 DIAGNOSIS — Z716 Tobacco abuse counseling: Secondary | ICD-10-CM | POA: Diagnosis not present

## 2020-03-19 LAB — POCT GLYCOSYLATED HEMOGLOBIN (HGB A1C)
Est. average glucose Bld gHb Est-mCnc: 154
Hemoglobin A1C: 7 % — AB (ref 4.0–5.6)

## 2020-03-19 MED ORDER — BUPROPION HCL ER (XL) 150 MG PO TB24: 150.0000 mg | ORAL_TABLET | Freq: Every day | ORAL | 1 refills | Status: DC

## 2020-03-19 MED ORDER — CHANTIX STARTING MONTH PAK 0.5 MG X 11 & 1 MG X 42 PO TABS: ORAL_TABLET | ORAL | 0 refills | Status: DC

## 2020-03-19 MED ORDER — METFORMIN HCL ER 500 MG PO TB24: ORAL_TABLET | ORAL | 1 refills | Status: DC

## 2020-03-19 MED ORDER — GABAPENTIN 300 MG PO CAPS: ORAL_CAPSULE | ORAL | 5 refills | Status: DC

## 2020-03-19 MED ORDER — NICOTINE 21 MG/24HR TD PT24: 21.0000 mg | MEDICATED_PATCH | Freq: Every day | TRANSDERMAL | 0 refills | Status: DC

## 2020-03-19 MED ORDER — TRAZODONE HCL 50 MG PO TABS: 25.0000 mg | ORAL_TABLET | Freq: Every evening | ORAL | 1 refills | Status: DC | PRN

## 2020-03-19 NOTE — Patient Instructions (Signed)

## 2020-03-20 ENCOUNTER — Telehealth: Payer: Self-pay

## 2020-03-20 MED ORDER — VARENICLINE TARTRATE 0.5 MG PO TABS
ORAL_TABLET | ORAL | 0 refills | Status: DC
Start: 2020-03-20 — End: 2021-04-23

## 2020-03-20 NOTE — Telephone Encounter (Signed)
Copied from [place] [id number]. Topic: General - Call Back - No Documentation >> Mar 20, 2020 11:43 AM Erick Blinks wrote: [place], [place] [address] Phone: [phone] Fax: [phone]  Pharmacy called to report the following:  The Chantex Rx that was requested is not available. They have Chantex 0.5 or the chantex 1 in the generic, these bottles are not breakable. Please advise

## 2020-03-20 NOTE — Telephone Encounter (Signed)
Pharmacist advised as directed below.

## 2020-03-20 NOTE — Telephone Encounter (Signed)
Changed to 0.5mg  starting for 1st month. After completing if doing well will change to 1mg  dose

## 2020-04-01 ENCOUNTER — Other Ambulatory Visit: Payer: Self-pay

## 2020-04-01 ENCOUNTER — Ambulatory Visit (INDEPENDENT_AMBULATORY_CARE_PROVIDER_SITE_OTHER): Payer: BC Managed Care – PPO | Admitting: Surgery

## 2020-04-01 ENCOUNTER — Encounter: Payer: Self-pay | Admitting: Surgery

## 2020-04-01 VITALS — BP 119/71 | HR 75 | Temp 98.1°F | Ht 70.0 in | Wt 222.0 lb

## 2020-04-01 DIAGNOSIS — K913 Postprocedural intestinal obstruction, unspecified as to partial versus complete: Secondary | ICD-10-CM

## 2020-04-01 DIAGNOSIS — K5720 Diverticulitis of large intestine with perforation and abscess without bleeding: Secondary | ICD-10-CM

## 2020-04-01 MED ORDER — BISACODYL 5 MG PO TBEC
DELAYED_RELEASE_TABLET | ORAL | 0 refills | Status: DC
Start: 2020-04-01 — End: 2021-04-23

## 2020-04-01 MED ORDER — POLYETHYLENE GLYCOL 3350 17 GM/SCOOP PO POWD
ORAL | 0 refills | Status: DC
Start: 2020-04-01 — End: 2021-04-23

## 2020-04-01 NOTE — Patient Instructions (Addendum)
We will get you scheduled for a Barium enema. This will be done at [facility].  You will need to do a bowel prep for this the day prior. We have sent in a prescription for this.   You are scheduled for a Barium enema at [facility] on 04/09/20 at 8:00 am. You will need to arrive there by 7:30 am and go in through the [place] entrance. Please follow the bowel prep instructions provided.  We will call you with the results.

## 2020-04-01 NOTE — Progress Notes (Signed)
04/01/2020  History of Present Illness: Linda Bradford is a 64 y.o. female s/p laparoscopic sigmoidectomy for diverticulitis with fistula on 09/03/18.  She's also s/p laparoscopic cholecystectomy on 07/01/19 for chronic cholecystitis.  She had a colonoscopy with Dr. Bonna Gains on 03/08/19 which showed a patent anastomosis but with mild narrowing.  The patient presents because recently she had a bout of diarrhea that lasted about a week, and ended earlier this week.  She had perianal pain with this.  She's also been feeling that she has something prolapsing or bulging out of the anal canal, and feels rectal urgency as if she has not fully evacuated with bowel movements.  She has been feeling intermittent low abdominal pain as well, but she says its all over the place and hard to really characterize.  Denies any nausea, vomiting.  Reports eating well and not having any issues from gallbladder standpoint.  She does mention that on her last childbirth, she did have a tear in the vaginal wall and it was repaired.  Past Medical History: Past Medical History:  Diagnosis Date  . Anxiety   . Arthritis   . Cancer ([place])    skin cancer, removed  . COPD (chronic obstructive pulmonary disease) ([place])   . Depression   . Diabetes mellitus without complication ([place])   . Dysrhythmia    occasional pvc's. not being treated.  . Fatigue   . GERD (gastroesophageal reflux disease)   . Headache   . Hypertension   . Hypothyroidism   . Thyroid disease      Past Surgical History: Past Surgical History:  Procedure Laterality Date  . APPENDECTOMY    . CHOLECYSTECTOMY N/A 07/01/2019   Procedure: LAPAROSCOPIC CHOLECYSTECTOMY;  Surgeon: Olean Ree, MD;  Location: [facility] ORS;  Service: General;  Laterality: N/A;  . COLONOSCOPY WITH PROPOFOL N/A 03/08/2019   Procedure: COLONOSCOPY WITH PROPOFOL;  Surgeon: Virgel Manifold, MD;  Location: [facility] ENDOSCOPY;  Service: Endoscopy;  Laterality: N/A;  . CYSTOSCOPY WITH STENT  PLACEMENT Bilateral 09/03/2018   Procedure: CYSTOSCOPY WITH STENT PLACEMENT-LIGHTED STENTS;  Surgeon: Hollice Espy, MD;  Location: [facility] ORS;  Service: Urology;  Laterality: Bilateral;  . LAPAROSCOPIC SIGMOID COLECTOMY N/A 09/03/2018   Procedure: LAPAROSCOPIC SIGMOID COLECTOMY;  Surgeon: Olean Ree, MD;  Location: [facility] ORS;  Service: General;  Laterality: N/A;  . percutaneous drainage tube  07/2018   2nd tube placed. not healing in colon, causing a fistula  . TONSILLECTOMY    . TUBAL LIGATION      Home Medications: Prior to Admission medications   Medication Sig Start Date End Date Taking? Authorizing Provider  acetaminophen (TYLENOL) 500 MG tablet Take 1,000 mg by mouth every 6 (six) hours as needed for mild pain or moderate pain.    Yes [provider]  albuterol (VENTOLIN HFA) 108 (90 Base) MCG/ACT inhaler albuterol sulfate HFA 90 mcg/actuation aerosol inhaler   Yes [provider]  BREO ELLIPTA 200-25 MCG/INH AEPB INHALE 1 PUFF BY MOUTH ONCE DAILY 03/16/20  Yes Fenton Malling M, PA-C  buPROPion (WELLBUTRIN XL) 150 MG 24 hr tablet Take 1 tablet (150 mg total) by mouth daily. 03/19/20  Yes Burnette, Clearnce Sorrel, PA-C  clonazePAM (KLONOPIN) 0.5 MG tablet TAKE 1/2 TABLET BY MOUTH TWICE DAILY AS NEEDED 10/08/19  Yes Mar Daring, PA-C  diclofenac sodium (VOLTAREN) 1 % GEL Apply 4 g topically 4 (four) times daily as needed. Patient taking differently: Apply 4 g topically 4 (four) times daily as needed. Apply to knees 03/01/18  Yes Mar Daring, PA-C  DULoxetine (CYMBALTA) 60 MG capsule TAKE 1 CAPSULE BY MOUTH TWICE DAILY 02/04/20  Yes Mar Daring, PA-C  ergocalciferol (VITAMIN D2) 1.25 MG (50000 UT) capsule Take 1 capsule (50,000 Units total) by mouth once a week. Once a week on Wednesday 03/14/19  Yes Burnette, Clearnce Sorrel, PA-C  ezetimibe (ZETIA) 10 MG tablet TAKE 1 TABLET DAILY Patient taking differently: Take 10 mg by mouth daily.  05/20/19  Yes Fenton Malling M, PA-C  fluticasone [facility]) 50 MCG/ACT nasal spray USE 2 SPRAYS IN EACH NOSTRIL ONCE DAILY 07/02/19  Yes Mar Daring, PA-C  gabapentin (NEURONTIN) 300 MG capsule TAKE 3 CAPSULES BY MOUTH ONCE EVERY MORNING & 5 CAPSULES AT BEDTIME 03/19/20  Yes Fenton Malling M, PA-C  glucose blood (CONTOUR NEXT TEST) test strip To check blood sugar once daily 06/09/17  Yes Burnette, Jennifer M, PA-C  ibuprofen (ADVIL) 600 MG tablet Take 1 tablet (600 mg total) by mouth every 8 (eight) hours as needed for mild pain or moderate pain. 07/01/19  Yes Katalyna Socarras, MD  ipratropium-albuterol (DUONEB) 0.5-2.5 (3) MG/3ML SOLN Take 3 mLs by nebulization every 6 (six) hours as needed. 01/15/20  Yes Mar Daring, PA-C  levothyroxine (SYNTHROID) 75 MCG tablet TAKE 1 TABLET DAILY ON AN EMPTY STOMACH, WAIT 30 MINUTES BEFORE TAKING OTHER MEDICATIONS 01/21/20  Yes Fenton Malling M, PA-C  meclizine (ANTIVERT) 25 MG tablet Take 1 tablet (25 mg total) by mouth 3 (three) times daily as needed for dizziness. 05/15/18  Yes Mar Daring, PA-C  meloxicam (MOBIC) 15 MG tablet TAKE 1 TABLET BY MOUTH ONCE DAILY Patient taking differently: Take 15 mg by mouth daily.  05/01/19  Yes Fenton Malling M, PA-C  metFORMIN (GLUCOPHAGE-XR) 500 MG 24 hr tablet TAKE 2 TABLETS BY MOUTH ONCE DAILY WITH SUPPER 03/19/20  Yes Mar Daring, PA-C  metoprolol succinate (TOPROL-XL) 50 MG 24 hr tablet Take 1 tablet (50 mg total) by mouth daily. Take with or immediately following a meal. 01/21/20  Yes Burnette, Clearnce Sorrel, PA-C  nicotine (NICODERM CQ - DOSED IN MG/24 HOURS) 21 mg/24hr patch Place 1 patch (21 mg total) onto the skin daily. 03/19/20  Yes Burnette, Clearnce Sorrel, PA-C  nitrofurantoin, macrocrystal-monohydrate, (MACROBID) 100 MG capsule TAKE 1 CAPSULE BY MOUTH AT BEDTIME 02/12/20  Yes Mar Daring, PA-C  nystatin (MYCOSTATIN) 100000 UNIT/ML suspension TAKE 1 TEASPOON BY MOUTH 4 TIMES DAILY 09/05/19  Yes  Mar Daring, PA-C  ondansetron (ZOFRAN) 4 MG tablet TAKE 1 TABLET BY MOUTH EVERY 8 HOURS AS NEEDED 08/15/19  Yes Fenton Malling M, PA-C  polyethylene glycol (MIRALAX / GLYCOLAX) packet Take 17 g by mouth daily. 06/02/18  Yes Edison Simon R, PA-C  pyridOXINE (VITAMIN B-6) 100 MG tablet Take 100 mg by mouth daily.   Yes [provider]  THEO-24 300 MG 24 hr capsule TAKE 1 CAPSULE BY MOUTH ONCE DAILY 07/02/19  Yes Burnette, Anderson Malta M, PA-C  tiZANidine (ZANAFLEX) 4 MG tablet TAKE 1 TABLET AT BEDTIME AS NEEDED FOR MUSCLE SPASMS Patient taking differently: Take 4 mg by mouth at bedtime.  05/24/19  Yes Mar Daring, PA-C  traZODone (DESYREL) 50 MG tablet Take 0.5-2 tablets (25-100 mg total) by mouth at bedtime as needed for sleep. 03/19/20  Yes Mar Daring, PA-C  varenicline (CHANTIX) 0.5 MG tablet Take one 0.5 mg tablet by mouth once daily for 3 days, then increase to one 0.5 mg tablet twice daily for 4 days,  then increase to one 1 mg tablet twice daily. 03/20/20  Yes Mar Daring, PA-C  vitamin B-12 (CYANOCOBALAMIN) 1000 MCG tablet Take 1,000 mcg by mouth daily.   Yes [provider]  bisacodyl (DULCOLAX) 5 MG EC tablet Take as directed 04/01/20   Mallory Enriques, Jacqulyn Bath, MD  polyethylene glycol powder (MIRALAX) 17 GM/SCOOP powder Mix full container in 64 ounces of Gatorade or other clear liquid. NO RED 04/01/20   Willena Jeancharles, Jacqulyn Bath, MD  gabapentin (NEURONTIN) 300 MG capsule TAKE 3 CAPSULES BY MOUTH ONCE EVERY MORNING AND 4 CAPSULES AT BEDTIME Patient taking differently: Take 900-1,200 mg by mouth See admin instructions. 900 mg every morning and 1200 mg at bedtime 01/28/19   Mar Daring, PA-C    Allergies: Allergies  Allergen Reactions  . Dexilant [Dexlansoprazole] Nausea Only and Other (See Comments)    Dizziness    Review of Systems: Review of Systems  Constitutional: Negative for chills and fever.  Respiratory: Negative for shortness of breath.    Cardiovascular: Negative for chest pain.  Gastrointestinal: Positive for abdominal pain and diarrhea. Negative for constipation, nausea and vomiting.  Genitourinary: Negative for dysuria.  Musculoskeletal: Negative for myalgias.  Skin: Negative for rash.    Physical Exam BP 119/71   Pulse 75   Temp 98.1 F (36.7 C)   Ht 5\' 10"  (1.778 m)   Wt 222 lb (100.7 kg)   SpO2 94%   BMI 31.85 kg/m  CONSTITUTIONAL: No acute distress HEENT:  Normocephalic, atraumatic, extraocular motion intact. RESPIRATORY:  Lungs are clear, and breath sounds are equal bilaterally. Normal respiratory effort without pathologic use of accessory muscles. CARDIOVASCULAR: Heart is regular without murmurs, gallops, or rubs. GI: The abdomen is soft, non-distended, currently non-tender to palpation.  Prior incsions are well healed without any hernia evidence.  No inguinal hernias palpable. RECTAL:  External exam does not reveal any enlarged external hemorrhoid tissue.  Scar tissue from likely episiotomy repair is well healed, and there's no evidence of any prolapse.  Digital rectal exam reveals no enlarged internal hemorrhoids, but there is stool in the rectal vault.  No gross blood on glove. NEUROLOGIC:  Motor and sensation is grossly normal.  Cranial nerves are grossly intact. PSYCH:  Alert and oriented to person, place and time. Affect is normal.  Labs/Imaging: A1c 7.0 on 03/19/20  Assessment and Plan: This is a 64 y.o. female s/p laparoscopic sigmoidectomy, with mild narrowing noted on post-op colonoscopy last year.  --Discussed with the patient that the diarrhea and perianal pain could have been due to a bout of gastroenteritis.  However, on exam, there's no explanation for the sensation of something bulging or prolapsing.  No internal or external hemorrhoids. --No palpable hernias or other finding to explain her pain on exam.  As a precaution, since she did have some narrowing of her anastomosis during her  colonoscopy a year ago, will order a barium enema to evaluate the anastomosis again.  If there is worse narrowing, then may need to discuss surgical options. --Will call patient with results and to determine further follow up or other imaging needed.  Face-to-face time spent with the patient and care providers was 25 minutes, with more than 50% of the time spent counseling, educating, and coordinating care of the patient.     Melvyn Neth, [facility]

## 2020-04-09 ENCOUNTER — Ambulatory Visit
Admission: RE | Admit: 2020-04-09 | Discharge: 2020-04-09 | Disposition: A | Discharge status: Home / Self Care | Payer: BC Managed Care – PPO | Source: Ambulatory Visit | Attending: Surgery | Admitting: Surgery

## 2020-04-09 ENCOUNTER — Other Ambulatory Visit: Payer: Self-pay

## 2020-04-09 DIAGNOSIS — K5720 Diverticulitis of large intestine with perforation and abscess without bleeding: Secondary | ICD-10-CM | POA: Diagnosis not present

## 2020-04-09 DIAGNOSIS — K913 Postprocedural intestinal obstruction, unspecified as to partial versus complete: Secondary | ICD-10-CM | POA: Diagnosis not present

## 2020-04-10 NOTE — Progress Notes (Signed)
04/10/20  Called patient and discussed results from barium enema.  Do not see any areas of stricture and contrast flows well throughout the rectum, anastomosis, and colon.  Follow up as needed.  Olean Ree, MD

## 2020-04-11 ENCOUNTER — Other Ambulatory Visit: Payer: Self-pay | Admitting: Physician Assistant

## 2020-04-11 DIAGNOSIS — F32A Depression, unspecified: Secondary | ICD-10-CM

## 2020-04-11 NOTE — Telephone Encounter (Signed)
Requested Prescriptions  Pending Prescriptions Disp Refills   DULoxetine (CYMBALTA) 60 MG capsule [Pharmacy Med Name: DULOXETINE HCL 60 MG CAP] 180 capsule 0    Sig: TAKE 1 CAPSULE BY MOUTH TWICE DAILY     Psychiatry: Antidepressants - SNRI Passed - 04/11/2020 10:17 AM      Passed - Completed PHQ-2 or PHQ-9 in the last 360 days.      Passed - Last BP in normal range    BP Readings from Last 1 Encounters:  04/01/20 119/71         Passed - Valid encounter within last 6 months    Recent Outpatient Visits          3 weeks ago Diabetes mellitus without complication [facility])   [place], [place]   3 months ago COPD with acute exacerbation [facility])   [facility] Flinchum, Kelby Aline, FNP   4 months ago Annual physical exam   [facility] Fenton Malling M, [place]   11 months ago Recurrent UTI   [facility] Fenton Malling M, [place]   11 months ago Recurrent UTI   [facility], Clearnce Sorrel, [place]      Future Appointments            In 2 weeks Marlyn Corporal, Clearnce Sorrel, PA-C [facility], [place]   In 1 month Burnette, Clearnce Sorrel, PA-C [facility], [facility]

## 2020-04-18 ENCOUNTER — Other Ambulatory Visit: Payer: Self-pay | Admitting: Physician Assistant

## 2020-04-18 DIAGNOSIS — F5101 Primary insomnia: Secondary | ICD-10-CM

## 2020-04-18 DIAGNOSIS — F331 Major depressive disorder, recurrent, moderate: Secondary | ICD-10-CM

## 2020-04-18 DIAGNOSIS — F411 Generalized anxiety disorder: Secondary | ICD-10-CM

## 2020-04-18 NOTE — Telephone Encounter (Signed)
Requested Prescriptions  Pending Prescriptions Disp Refills  . traZODone (DESYREL) 50 MG tablet [Pharmacy Med Name: TRAZODONE HCL 50 MG TAB] 60 tablet 1    Sig: TAKE 1/2 TO 2 TABLETS BY MOUTH AT BEDTIME AS NEEDED SLEEP     Psychiatry: Antidepressants - Serotonin Modulator Passed - 04/18/2020  1:33 PM      Passed - Completed PHQ-2 or PHQ-9 in the last 360 days.      Passed - Valid encounter within last 6 months    Recent Outpatient Visits          1 month ago Diabetes mellitus without complication [facility])   [place], PA-C   3 months ago COPD with acute exacerbation [facility])   [place] Flinchum, Kelby Aline, FNP   4 months ago Annual physical exam   [facility] Fenton Malling M, [place]   11 months ago Recurrent UTI   [facility], Clearnce Sorrel, [place]   1 year ago Recurrent UTI   [facility], Clearnce Sorrel, PA-C      Future Appointments            In 1 week Marlyn Corporal, Clearnce Sorrel, PA-C [facility], [place]   In 1 month Burnette, Clearnce Sorrel, PA-C [facility], [facility]           . buPROPion (WELLBUTRIN XL) 150 MG 24 hr tablet [Pharmacy Med Name: BUPROPION HCL ER (XL) 150 MG TAB] 90 tablet 1    Sig: TAKE 1 TABLET BY MOUTH ONCE DAILY     Psychiatry: Antidepressants - bupropion Passed - 04/18/2020  1:33 PM      Passed - Completed PHQ-2 or PHQ-9 in the last 360 days.      Passed - Last BP in normal range    BP Readings from Last 1 Encounters:  04/01/20 119/71         Passed - Valid encounter within last 6 months    Recent Outpatient Visits          1 month ago Diabetes mellitus without complication [facility])   [place], [place]   3 months ago COPD with acute exacerbation [facility])   [place] Flinchum, Kelby Aline, FNP   4 months ago Annual physical exam   [facility] Fenton Malling M,  [place]   11 months ago Recurrent UTI   [facility], Clearnce Sorrel, [place]   1 year ago Recurrent UTI   [facility], Clearnce Sorrel, [place]      Future Appointments            In 1 week Marlyn Corporal, Clearnce Sorrel, PA-C [facility], [place]   In 1 month Burnette, Clearnce Sorrel, PA-C [facility], [facility]

## 2020-04-19 ENCOUNTER — Other Ambulatory Visit: Payer: Self-pay | Admitting: Physician Assistant

## 2020-04-19 DIAGNOSIS — I10 Essential (primary) hypertension: Secondary | ICD-10-CM

## 2020-04-19 DIAGNOSIS — G250 Essential tremor: Secondary | ICD-10-CM

## 2020-04-21 DIAGNOSIS — M170 Bilateral primary osteoarthritis of knee: Secondary | ICD-10-CM | POA: Diagnosis not present

## 2020-04-22 DIAGNOSIS — M170 Bilateral primary osteoarthritis of knee: Secondary | ICD-10-CM | POA: Insufficient documentation

## 2020-04-29 ENCOUNTER — Encounter: Payer: Self-pay | Admitting: Physician Assistant

## 2020-04-29 ENCOUNTER — Other Ambulatory Visit: Payer: Self-pay

## 2020-04-29 ENCOUNTER — Ambulatory Visit (INDEPENDENT_AMBULATORY_CARE_PROVIDER_SITE_OTHER): Payer: BC Managed Care – PPO | Admitting: Physician Assistant

## 2020-04-29 ENCOUNTER — Other Ambulatory Visit: Payer: Self-pay | Admitting: Physician Assistant

## 2020-04-29 VITALS — BP 117/60 | HR 71 | Temp 98.4°F | Resp 16 | Wt 216.4 lb

## 2020-04-29 DIAGNOSIS — F411 Generalized anxiety disorder: Secondary | ICD-10-CM | POA: Diagnosis not present

## 2020-04-29 DIAGNOSIS — Z23 Encounter for immunization: Secondary | ICD-10-CM | POA: Diagnosis not present

## 2020-04-29 DIAGNOSIS — F331 Major depressive disorder, recurrent, moderate: Secondary | ICD-10-CM | POA: Diagnosis not present

## 2020-04-29 DIAGNOSIS — F5101 Primary insomnia: Secondary | ICD-10-CM | POA: Diagnosis not present

## 2020-04-29 DIAGNOSIS — F17200 Nicotine dependence, unspecified, uncomplicated: Secondary | ICD-10-CM

## 2020-04-29 MED ORDER — TRAZODONE HCL 100 MG PO TABS: 100.0000 mg | ORAL_TABLET | Freq: Every day | ORAL | 1 refills | Status: DC

## 2020-04-29 NOTE — Progress Notes (Signed)
Established patient visit   Patient: Linda Bradford   DOB: 1955/12/09   64 y.o. Female  MRN: [id number] Visit Date: 04/29/2020  Today's healthcare provider: Mar Daring, PA-C   Chief Complaint  Patient presents with  . Follow-up   Subjective    HPI  Follow up for primary Insomnia  The patient was last seen for this 4-6 weeks ago. Changes made at last visit include start Trazodone and to titrate as needed. Patient reports that she is doing 100 mg.  She reports excellent compliance with treatment. She feels that condition is Improved. She is not having side effects.  ----------------------------------------------------------------------------------------- Depression and Anxiety: Reports that the Wellbutrin is helping a lot. She is doing the Duloxetine 60 mg. Reports that she had diarrhea for a week with anything she ate. Reports that it got better but now she is having constipation for 2 days sometimes 3 then she start back with the loose stool on and off. She is not sure if this is coming from the Wellbutrin or the Trazodone.   Tobacco she is smoking a pack a day when she was smoking a pack in half a day. Feels the Wellbutrin helps with that. She is not using the nicotine patches.    Tobacco Use: High Risk  . Smoking Tobacco Use: Current Every Day Smoker  . Smokeless Tobacco Use: Never Used   Wt Readings from Last 3 Encounters:  04/29/20 216 lb 6.4 oz (98.2 kg)  04/01/20 222 lb (100.7 kg)  03/19/20 227 lb 9.6 oz (103.2 kg)    Patient Active Problem List   Diagnosis Date Noted  . Chronic obstructive pulmonary disease with acute exacerbation ([place]) 12/31/2019  . Cough 12/31/2019  . Suspected COVID-19 virus infection 12/31/2019  . Antibiotic-induced yeast infection- history of  12/31/2019  . Biliary dyskinesia   . Diverticulitis of large intestine with abscess 05/27/2018  . Diabetes mellitus without complication ([place]) 82/80/0349  . Lateral epicondylitis  07/07/2017  . Personal history of tobacco use, presenting hazards to health 05/03/2016  . Bronchitis, chronic ([place]) 03/03/2015  . Depression, major, recurrent, moderate ([place]) 11/05/2014  . Anxiety, generalized 11/05/2014  . Alcohol abuse, in remission 11/05/2014  . Nicotine addiction 11/05/2014  . H/O: osteoarthritis 11/05/2014  . Fibromyalgia 11/05/2014  . Restless leg syndrome 11/05/2014  . Arthralgia of hip 01/13/2014  . Adiposity 01/13/2014  . Disordered sleep 01/13/2014  . Airway hyperreactivity 12/17/2013  . Adult hypothyroidism 12/17/2013  . Compulsive tobacco user syndrome 12/17/2013  . Current tobacco use 12/17/2013  . Basal cell carcinoma 11/04/2013  . Arthropathia 02/08/2012  . Arthritis of knee, left 02/08/2012   Past Medical History:  Diagnosis Date  . Anxiety   . Arthritis   . Cancer ([place])    skin cancer, removed  . COPD (chronic obstructive pulmonary disease) ([place])   . Depression   . Diabetes mellitus without complication ([place])   . Dysrhythmia    occasional pvc's. not being treated.  . Fatigue   . GERD (gastroesophageal reflux disease)   . Headache   . Hypertension   . Hypothyroidism   . Thyroid disease        Medications: Outpatient Medications Prior to Visit  Medication Sig  . acetaminophen (TYLENOL) 500 MG tablet Take 1,000 mg by mouth every 6 (six) hours as needed for mild pain or moderate pain.   Marland Kitchen albuterol (VENTOLIN HFA) 108 (90 Base) MCG/ACT inhaler albuterol sulfate HFA 90 mcg/actuation aerosol inhaler  . bisacodyl (DULCOLAX) 5  MG EC tablet Take as directed  . BREO ELLIPTA 200-25 MCG/INH AEPB INHALE 1 PUFF BY MOUTH ONCE DAILY  . buPROPion (WELLBUTRIN XL) 150 MG 24 hr tablet TAKE 1 TABLET BY MOUTH ONCE DAILY  . clonazePAM (KLONOPIN) 0.5 MG tablet TAKE 1/2 TABLET BY MOUTH TWICE DAILY AS NEEDED  . diclofenac sodium (VOLTAREN) 1 % GEL Apply 4 g topically 4 (four) times daily as needed. (Patient taking differently: Apply 4 g topically 4 (four)  times daily as needed. Apply to knees)  . DULoxetine (CYMBALTA) 60 MG capsule TAKE 1 CAPSULE BY MOUTH TWICE DAILY  . ergocalciferol (VITAMIN D2) 1.25 MG (50000 UT) capsule Take 1 capsule (50,000 Units total) by mouth once a week. Once a week on Wednesday  . ezetimibe (ZETIA) 10 MG tablet TAKE 1 TABLET DAILY (Patient taking differently: Take 10 mg by mouth daily. )  . fluticasone (FLONASE) 50 MCG/ACT nasal spray USE 2 SPRAYS IN EACH NOSTRIL ONCE DAILY  . gabapentin (NEURONTIN) 300 MG capsule TAKE 3 CAPSULES BY MOUTH ONCE EVERY MORNING & 5 CAPSULES AT BEDTIME  . glucose blood (CONTOUR NEXT TEST) test strip To check blood sugar once daily  . ibuprofen (ADVIL) 600 MG tablet Take 1 tablet (600 mg total) by mouth every 8 (eight) hours as needed for mild pain or moderate pain.  Marland Kitchen ipratropium-albuterol (DUONEB) 0.5-2.5 (3) MG/3ML SOLN Take 3 mLs by nebulization every 6 (six) hours as needed.  Marland Kitchen levothyroxine (SYNTHROID) 75 MCG tablet TAKE 1 TABLET DAILY ON AN EMPTY STOMACH, WAIT 30 MINUTES BEFORE TAKING OTHER MEDICATIONS  . meclizine (ANTIVERT) 25 MG tablet Take 1 tablet (25 mg total) by mouth 3 (three) times daily as needed for dizziness.  . meloxicam (MOBIC) 15 MG tablet TAKE 1 TABLET BY MOUTH ONCE DAILY (Patient taking differently: Take 15 mg by mouth daily. )  . metFORMIN (GLUCOPHAGE-XR) 500 MG 24 hr tablet TAKE 2 TABLETS BY MOUTH ONCE DAILY WITH SUPPER  . metoprolol succinate (TOPROL-XL) 50 MG 24 hr tablet TAKE 1 TABLET DAILY WITH OR IMMEDIATELY FOLLOWING A MEAL  . nicotine (NICODERM CQ - DOSED IN MG/24 HOURS) 21 mg/24hr patch Place 1 patch (21 mg total) onto the skin daily.  . nitrofurantoin, macrocrystal-monohydrate, (MACROBID) 100 MG capsule TAKE 1 CAPSULE BY MOUTH AT BEDTIME  . nystatin (MYCOSTATIN) 100000 UNIT/ML suspension TAKE 1 TEASPOON BY MOUTH 4 TIMES DAILY  . ondansetron (ZOFRAN) 4 MG tablet TAKE 1 TABLET BY MOUTH EVERY 8 HOURS AS NEEDED  . polyethylene glycol (MIRALAX / GLYCOLAX) packet  Take 17 g by mouth daily.  . polyethylene glycol powder (MIRALAX) 17 GM/SCOOP powder Mix full container in 64 ounces of Gatorade or other clear liquid. NO RED  . pyridOXINE (VITAMIN B-6) 100 MG tablet Take 100 mg by mouth daily.  Marland Kitchen THEO-24 300 MG 24 hr capsule TAKE 1 CAPSULE BY MOUTH ONCE DAILY  . tiZANidine (ZANAFLEX) 4 MG tablet TAKE 1 TABLET AT BEDTIME AS NEEDED FOR MUSCLE SPASMS (Patient taking differently: Take 4 mg by mouth at bedtime. )  . traZODone (DESYREL) 50 MG tablet TAKE 1/2 TO 2 TABLETS BY MOUTH AT BEDTIME AS NEEDED SLEEP  . varenicline (CHANTIX) 0.5 MG tablet Take one 0.5 mg tablet by mouth once daily for 3 days, then increase to one 0.5 mg tablet twice daily for 4 days, then increase to one 1 mg tablet twice daily.  . vitamin B-12 (CYANOCOBALAMIN) 1000 MCG tablet Take 1,000 mcg by mouth daily.   No facility-administered medications prior to visit.  Review of Systems  Constitutional: Negative.   Respiratory: Negative.   Cardiovascular: Negative.   Psychiatric/Behavioral: Positive for dysphoric mood and sleep disturbance. The patient is nervous/anxious.     Last CBC Lab Results  Component Value Date   WBC 7.0 07/06/2019   HGB 14.4 07/06/2019   HCT 43.5 07/06/2019   MCV 94.4 07/06/2019   MCH 31.2 07/06/2019   RDW 12.5 07/06/2019   PLT 257 55/73/2202   Last metabolic panel Lab Results  Component Value Date   GLUCOSE 143 (H) 07/06/2019   NA 138 07/06/2019   K 3.8 07/06/2019   CL 100 07/06/2019   CO2 28 07/06/2019   BUN 14 07/06/2019   CREATININE 0.53 07/06/2019   GFRNONAA >60 07/06/2019   GFRAA >60 07/06/2019   CALCIUM 9.2 07/06/2019   PROT 7.5 07/06/2019   ALBUMIN 3.9 07/06/2019   BILITOT 0.5 07/06/2019   ALKPHOS 95 07/06/2019   AST 20 07/06/2019   ALT 20 07/06/2019   ANIONGAP 10 07/06/2019      Objective    BP 117/60 (BP Location: Left Arm, Patient Position: Sitting, Cuff Size: Large)   Pulse 71   Temp 98.4 F (36.9 C) (Oral)   Resp 16   Wt  216 lb 6.4 oz (98.2 kg)   BMI 31.05 kg/m  BP Readings from Last 3 Encounters:  04/29/20 117/60  04/01/20 119/71  03/19/20 129/82   Wt Readings from Last 3 Encounters:  04/29/20 216 lb 6.4 oz (98.2 kg)  04/01/20 222 lb (100.7 kg)  03/19/20 227 lb 9.6 oz (103.2 kg)      Physical Exam Vitals reviewed.  Constitutional:      General: She is not in acute distress.    Appearance: Normal appearance. She is well-developed. She is obese. She is not ill-appearing or diaphoretic.  HENT:     Head: Normocephalic and atraumatic.  Eyes:     General: No scleral icterus.    Extraocular Movements: Extraocular movements intact.     Pupils: Pupils are equal, round, and reactive to light.  Cardiovascular:     Rate and Rhythm: Normal rate and regular rhythm.     Heart sounds: Normal heart sounds. No murmur heard.  No friction rub. No gallop.   Pulmonary:     Effort: Pulmonary effort is normal. No respiratory distress.     Breath sounds: Normal breath sounds. No wheezing or rales.  Musculoskeletal:     Cervical back: Normal range of motion and neck supple.  Skin:    General: Skin is warm and dry.  Neurological:     General: No focal deficit present.     Mental Status: She is alert.  Psychiatric:        Mood and Affect: Mood normal.        Behavior: Behavior normal.        Thought Content: Thought content normal.        Judgment: Judgment normal.       No results found for any visits on 04/29/20.  Assessment & Plan     1. Primary insomnia Improved. Continue Trazodone 100mg  as below.  - traZODone (DESYREL) 100 MG tablet; Take 1 tablet (100 mg total) by mouth at bedtime.  Dispense: 90 tablet; Refill: 1  2. Depression, major, recurrent, moderate ([place]) Improved. Continue Wellbutrin XR 150mg  and Duloxetine 60mg  BID.  3. Compulsive tobacco user syndrome Wellbutrin is helping cut back. Down from 1.5 PPD to 1 PPD.   4. Anxiety, generalized Improved. Continue wellbutrin  and duloxetine.     5. Need for influenza vaccination Flu vaccine given today without complication. Patient sat upright for 15 minutes to check for adverse reaction before being released. - Flu Vaccine QUAD 36+ mos IM   No follow-ups on file.         Rubye Beach  [facility] [phone] (phone) [phone] (fax)  [place]

## 2020-04-30 ENCOUNTER — Ambulatory Visit: Payer: Self-pay | Admitting: Physician Assistant

## 2020-05-01 ENCOUNTER — Other Ambulatory Visit: Payer: Self-pay | Admitting: Physician Assistant

## 2020-05-01 DIAGNOSIS — F419 Anxiety disorder, unspecified: Secondary | ICD-10-CM

## 2020-05-01 NOTE — Telephone Encounter (Signed)
Requested medication (s) are due for refill today: yes  Requested medication (s) are on the active medication list: yes  Last refill:  03/30/20  Future visit scheduled: yes  Notes to clinic:  not delegated    Requested Prescriptions  Pending Prescriptions Disp Refills   clonazePAM (KLONOPIN) 0.5 MG tablet [Pharmacy Med Name: CLONAZEPAM 0.5 MG TAB] 180 tablet     Sig: TAKE 1/2 TABLET BY MOUTH TWICE DAILY AS NEEDED      Not Delegated - Psychiatry:  Anxiolytics/Hypnotics Failed - 05/01/2020 12:50 PM      Failed - This refill cannot be delegated      Failed - Urine Drug Screen completed in last 360 days.      Passed - Valid encounter within last 6 months    Recent Outpatient Visits           2 days ago Need for influenza vaccination   [place], [place]   1 month ago Diabetes mellitus without complication [facility])   [place], PA-C   4 months ago COPD with acute exacerbation [facility])   [place] Flinchum, Kelby Aline, FNP   5 months ago Annual physical exam   [place], Clearnce Sorrel, [place]   1 year ago Recurrent UTI   [facility], Clearnce Sorrel, PA-C       Future Appointments             In 1 month [place], Clearnce Sorrel, PA-C [employer], [place]

## 2020-05-03 ENCOUNTER — Encounter: Payer: Self-pay | Admitting: Physician Assistant

## 2020-05-07 ENCOUNTER — Ambulatory Visit: Payer: BC Managed Care – PPO

## 2020-05-14 ENCOUNTER — Other Ambulatory Visit: Payer: Self-pay | Admitting: Physician Assistant

## 2020-05-14 DIAGNOSIS — E039 Hypothyroidism, unspecified: Secondary | ICD-10-CM

## 2020-05-14 DIAGNOSIS — E7800 Pure hypercholesterolemia, unspecified: Secondary | ICD-10-CM

## 2020-05-14 NOTE — Telephone Encounter (Signed)
Requested medications are due for refill today yes  Requested medications are on the active medication list yes  Last refill 02/16/20  Last visit 11/2019   Notes to clinic Lab work for these protocols were ordered in May but not performed, latest lab work is 2018, therefore, failed protocol, please assess.

## 2020-05-20 ENCOUNTER — Other Ambulatory Visit: Payer: Self-pay | Admitting: Physician Assistant

## 2020-05-20 DIAGNOSIS — E039 Hypothyroidism, unspecified: Secondary | ICD-10-CM

## 2020-05-20 DIAGNOSIS — E7800 Pure hypercholesterolemia, unspecified: Secondary | ICD-10-CM

## 2020-05-20 NOTE — Telephone Encounter (Signed)
Requested Prescriptions  Pending Prescriptions Disp Refills   ezetimibe (ZETIA) 10 MG tablet [Pharmacy Med Name: EZETIMIBE TABS 10MG ] 90 tablet 3    Sig: TAKE 1 TABLET DAILY     Cardiovascular:  Antilipid - Sterol Transport Inhibitors Failed - 05/20/2020  1:53 AM      Failed - Total Cholesterol in normal range and within 360 days    Cholesterol  Date Value Ref Range Status  05/19/2017 265 (H) <200 mg/dL Final         Failed - LDL in normal range and within 360 days    LDL Cholesterol (Calc)  Date Value Ref Range Status  05/19/2017 167 (H) mg/dL (calc) Final    Comment:    Reference range: <100 . Desirable range <100 mg/dL for primary prevention;   <70 mg/dL for patients with CHD or diabetic patients  with > or = 2 CHD risk factors. Marland Kitchen LDL-C is now calculated using the Martin-Hopkins  calculation, which is a validated novel method providing  better accuracy than the Friedewald equation in the  estimation of LDL-C.  Cresenciano Genre et al. Annamaria Helling. 1308;657(84): 2061-2068  (http://education.[facility].com/faq/FAQ164)          Failed - HDL in normal range and within 360 days    HDL  Date Value Ref Range Status  05/19/2017 62 >50 mg/dL Final         Failed - Triglycerides in normal range and within 360 days    Triglycerides  Date Value Ref Range Status  05/19/2017 204 (H) <150 mg/dL Final         Passed - Valid encounter within last 12 months    Recent Outpatient Visits          3 weeks ago Primary insomnia   [place], [place]   2 months ago Diabetes mellitus without complication [facility])   [place], [place], PA-C   4 months ago COPD with acute exacerbation [facility])   [place] Flinchum, Kelby Aline, FNP   5 months ago Annual physical exam   [facility] [place], Clearnce Sorrel, [place]   1 year ago Recurrent UTI   [facility] Marlyn Corporal, Anderson Malta M, [place]               levothyroxine (SYNTHROID) 75 MCG tablet [Pharmacy Med Name: L-THYROXINE (SYNTHROID) TABS 75MCG] 90 tablet 3    Sig: TAKE 1 TABLET DAILY ON AN EMPTY STOMACH, WAIT 45 MINUTES BEFORE TAKING OTHER MEDICATIONS     Endocrinology:  Hypothyroid Agents Failed - 05/20/2020  1:53 AM      Failed - TSH needs to be rechecked within 3 months after an abnormal result. Refill until TSH is due.      Failed - TSH in normal range and within 360 days    TSH  Date Value Ref Range Status  05/19/2017 2.16 0.40 - 4.50 mIU/L Final         Passed - Valid encounter within last 12 months    Recent Outpatient Visits          3 weeks ago Primary insomnia   [place], [place]   2 months ago Diabetes mellitus without complication [facility])   [place], [place]   4 months ago COPD with acute exacerbation [facility])   [place] Flinchum, Kelby Aline, FNP   5 months ago Annual physical exam   [facility] [place] Malling M,  PA-C   1 year ago Recurrent UTI   [place], [place]             These rx refills did not get transmitted through to the pharmacy on 05/15/2020.  Re-submitting

## 2020-05-22 ENCOUNTER — Ambulatory Visit
Admission: RE | Admit: 2020-05-22 | Discharge: 2020-05-22 | Disposition: A | Discharge status: Home / Self Care | Payer: BC Managed Care – PPO | Source: Ambulatory Visit | Attending: Physician Assistant | Admitting: Physician Assistant

## 2020-05-22 ENCOUNTER — Other Ambulatory Visit: Payer: Self-pay

## 2020-05-22 DIAGNOSIS — Z1239 Encounter for other screening for malignant neoplasm of breast: Secondary | ICD-10-CM

## 2020-05-22 DIAGNOSIS — Z1231 Encounter for screening mammogram for malignant neoplasm of breast: Secondary | ICD-10-CM | POA: Diagnosis not present

## 2020-05-25 ENCOUNTER — Other Ambulatory Visit: Payer: Self-pay | Admitting: Physician Assistant

## 2020-05-25 DIAGNOSIS — R928 Other abnormal and inconclusive findings on diagnostic imaging of breast: Secondary | ICD-10-CM

## 2020-05-25 DIAGNOSIS — N6310 Unspecified lump in the right breast, unspecified quadrant: Secondary | ICD-10-CM

## 2020-05-26 ENCOUNTER — Inpatient Hospital Stay
Admission: RE | Admit: 2020-05-26 | Discharge: 2020-05-26 | Disposition: A | Discharge status: Home / Self Care | Payer: Self-pay | Source: Ambulatory Visit | Attending: *Deleted | Admitting: *Deleted

## 2020-05-26 ENCOUNTER — Other Ambulatory Visit: Payer: Self-pay | Admitting: *Deleted

## 2020-05-26 ENCOUNTER — Encounter: Payer: Self-pay | Admitting: Physician Assistant

## 2020-05-26 DIAGNOSIS — Z1231 Encounter for screening mammogram for malignant neoplasm of breast: Secondary | ICD-10-CM

## 2020-05-28 DIAGNOSIS — M25562 Pain in left knee: Secondary | ICD-10-CM | POA: Diagnosis not present

## 2020-06-01 NOTE — Telephone Encounter (Signed)
Patient was advised of results and the only questions she has is what causes that? Does she need to have a biopsy? Reports that before this was a suggestion.

## 2020-06-04 ENCOUNTER — Ambulatory Visit: Payer: Self-pay | Admitting: Physician Assistant

## 2020-06-24 ENCOUNTER — Other Ambulatory Visit: Payer: Self-pay

## 2020-06-24 ENCOUNTER — Encounter: Payer: Self-pay | Admitting: Physician Assistant

## 2020-06-24 ENCOUNTER — Ambulatory Visit (INDEPENDENT_AMBULATORY_CARE_PROVIDER_SITE_OTHER): Payer: BC Managed Care – PPO | Admitting: Physician Assistant

## 2020-06-24 VITALS — BP 128/58 | HR 79 | Temp 98.4°F | Wt 220.7 lb

## 2020-06-24 DIAGNOSIS — R300 Dysuria: Secondary | ICD-10-CM | POA: Diagnosis not present

## 2020-06-24 DIAGNOSIS — N3090 Cystitis, unspecified without hematuria: Secondary | ICD-10-CM

## 2020-06-24 DIAGNOSIS — B373 Candidiasis of vulva and vagina: Secondary | ICD-10-CM

## 2020-06-24 DIAGNOSIS — B3731 Acute candidiasis of vulva and vagina: Secondary | ICD-10-CM

## 2020-06-24 MED ORDER — AMOXICILLIN-POT CLAVULANATE 875-125 MG PO TABS: 1.0000 | ORAL_TABLET | Freq: Two times a day (BID) | ORAL | 0 refills | Status: AC

## 2020-06-24 MED ORDER — FLUCONAZOLE 150 MG PO TABS: ORAL_TABLET | ORAL | 0 refills | Status: DC

## 2020-06-24 NOTE — Progress Notes (Signed)
Established patient visit   Patient: Linda Bradford   DOB: Jan 06, 1956   64 y.o. Female  MRN: [id number] Visit Date: 06/24/2020  Today's healthcare provider: Trinna Post, PA-C   Chief Complaint  Patient presents with  . Dysuria  I,Linda Bradford,acting as a scribe for Trinna Post, PA-C.,have documented all relevant documentation on the behalf of Trinna Post, PA-C,as directed by  Trinna Post, PA-C while in the presence of Trinna Post, PA-C.  Subjective    Dysuria  This is a new problem. The current episode started in the past 7 days. The problem occurs every urination. The problem has been gradually worsening. The quality of the pain is described as burning. The pain is at a severity of 4/10. The pain is mild. There has been no fever. Associated symptoms include frequency and urgency. Pertinent negatives include no discharge, flank pain, hesitancy, nausea or vomiting. Treatments tried: AZO. The treatment provided no relief. Her past medical history is significant for recurrent UTIs.        Medications: Outpatient Medications Prior to Visit  Medication Sig  . acetaminophen (TYLENOL) 500 MG tablet Take 1,000 mg by mouth every 6 (six) hours as needed for mild pain or moderate pain.   Marland Kitchen albuterol (VENTOLIN HFA) 108 (90 Base) MCG/ACT inhaler albuterol sulfate HFA 90 mcg/actuation aerosol inhaler  . bisacodyl (DULCOLAX) 5 MG EC tablet Take as directed  . BREO ELLIPTA 200-25 MCG/INH AEPB INHALE 1 PUFF BY MOUTH ONCE DAILY  . buPROPion (WELLBUTRIN XL) 150 MG 24 hr tablet TAKE 1 TABLET BY MOUTH ONCE DAILY  . clonazePAM (KLONOPIN) 0.5 MG tablet TAKE 1/2 TABLET BY MOUTH TWICE DAILY AS NEEDED  . diclofenac sodium (VOLTAREN) 1 % GEL Apply 4 g topically 4 (four) times daily as needed. (Patient taking differently: Apply 4 g topically 4 (four) times daily as needed. Apply to knees)  . DULoxetine (CYMBALTA) 60 MG capsule TAKE 1 CAPSULE BY MOUTH TWICE DAILY  .  ergocalciferol (VITAMIN D2) 1.25 MG (50000 UT) capsule Take 1 capsule (50,000 Units total) by mouth once a week. Once a week on Wednesday  . ezetimibe (ZETIA) 10 MG tablet TAKE 1 TABLET DAILY  . fluticasone (FLONASE) 50 MCG/ACT nasal spray USE 2 SPRAYS IN EACH NOSTRIL ONCE DAILY  . gabapentin (NEURONTIN) 300 MG capsule TAKE 3 CAPSULES BY MOUTH ONCE EVERY MORNING & 5 CAPSULES AT BEDTIME  . glucose blood (CONTOUR NEXT TEST) test strip To check blood sugar once daily  . ibuprofen (ADVIL) 600 MG tablet Take 1 tablet (600 mg total) by mouth every 8 (eight) hours as needed for mild pain or moderate pain.  Marland Kitchen ipratropium-albuterol (DUONEB) 0.5-2.5 (3) MG/3ML SOLN Take 3 mLs by nebulization every 6 (six) hours as needed.  Marland Kitchen levothyroxine (SYNTHROID) 75 MCG tablet TAKE 1 TABLET DAILY ON AN EMPTY STOMACH, WAIT 30 MINUTES BEFORE TAKING OTHER MEDICATIONS  . meclizine (ANTIVERT) 25 MG tablet Take 1 tablet (25 mg total) by mouth 3 (three) times daily as needed for dizziness.  . meloxicam (MOBIC) 15 MG tablet TAKE 1 TABLET BY MOUTH ONCE DAILY (Patient taking differently: Take 15 mg by mouth daily. )  . metFORMIN (GLUCOPHAGE-XR) 500 MG 24 hr tablet TAKE 2 TABLETS BY MOUTH ONCE DAILY WITH SUPPER  . metoprolol succinate (TOPROL-XL) 50 MG 24 hr tablet TAKE 1 TABLET DAILY WITH OR IMMEDIATELY FOLLOWING A MEAL  . nicotine (NICODERM CQ - DOSED IN MG/24 HOURS) 21 mg/24hr patch Place 1 patch (21  mg total) onto the skin daily.  Marland Kitchen nystatin (MYCOSTATIN) 100000 UNIT/ML suspension TAKE 1 TEASPOON BY MOUTH 4 TIMES DAILY  . ondansetron (ZOFRAN) 4 MG tablet TAKE 1 TABLET BY MOUTH EVERY 8 HOURS AS NEEDED  . polyethylene glycol powder (MIRALAX) 17 GM/SCOOP powder Mix full container in 64 ounces of Gatorade or other clear liquid. NO RED  . pyridOXINE (VITAMIN B-6) 100 MG tablet Take 100 mg by mouth daily.  Marland Kitchen THEO-24 300 MG 24 hr capsule TAKE 1 CAPSULE BY MOUTH ONCE DAILY  . tiZANidine (ZANAFLEX) 4 MG tablet TAKE 1 TABLET AT BEDTIME AS  NEEDED FOR MUSCLE SPASMS (Patient taking differently: Take 4 mg by mouth at bedtime. )  . traZODone (DESYREL) 100 MG tablet Take 1 tablet (100 mg total) by mouth at bedtime.  . varenicline (CHANTIX) 0.5 MG tablet Take one 0.5 mg tablet by mouth once daily for 3 days, then increase to one 0.5 mg tablet twice daily for 4 days, then increase to one 1 mg tablet twice daily.  . vitamin B-12 (CYANOCOBALAMIN) 1000 MCG tablet Take 1,000 mcg by mouth daily.  . nitrofurantoin, macrocrystal-monohydrate, (MACROBID) 100 MG capsule TAKE 1 CAPSULE BY MOUTH AT BEDTIME (Patient not taking: Reported on 06/24/2020)  . polyethylene glycol (MIRALAX / GLYCOLAX) packet Take 17 g by mouth daily. (Patient not taking: Reported on 06/24/2020)   No facility-administered medications prior to visit.    Review of Systems  Gastrointestinal: Negative for nausea and vomiting.  Genitourinary: Positive for dysuria, frequency and urgency. Negative for flank pain and hesitancy.      Objective    BP (!) 128/58 (BP Location: Left Arm, Patient Position: Sitting, Cuff Size: Large)   Pulse 79   Temp 98.4 F (36.9 C) (Oral)   Wt 220 lb 11.2 oz (100.1 kg)   SpO2 98%   BMI 31.67 kg/m    Physical Exam Constitutional:      General: She is not in acute distress.    Appearance: Normal appearance. She is well-developed. She is not diaphoretic.  Cardiovascular:     Rate and Rhythm: Normal rate and regular rhythm.  Pulmonary:     Effort: Pulmonary effort is normal.     Breath sounds: Normal breath sounds.  Abdominal:     General: Bowel sounds are normal. There is no distension.     Palpations: Abdomen is soft.     Tenderness: There is abdominal tenderness in the suprapubic area. There is no guarding or rebound.  Skin:    General: Skin is warm and dry.  Neurological:     General: No focal deficit present.     Mental Status: She is alert and oriented to person, place, and time.  Psychiatric:        Mood and Affect: Mood  normal.        Behavior: Behavior normal.       No results found for any visits on 06/24/20.  Assessment & Plan    1. Dysuria  - Urine Culture  2. Recurrent cystitis  Acute on chronic flare. Treat empirically as below.   - CULTURE, URINE COMPREHENSIVE - amoxicillin-clavulanate (AUGMENTIN) 875-125 MG tablet; Take 1 tablet by mouth 2 (two) times daily for 7 days.  Dispense: 14 tablet; Refill: 0  3. Vaginal candida  - fluconazole (DIFLUCAN) 150 MG tablet; Take one pill on day 1. Take second pill 3 days later if still symptomatic.  Dispense: 2 tablet; Refill: 0   No follow-ups on file.  ITrinna Post, PA-C, have reviewed all documentation for this visit. The documentation on 06/30/20 for the exam, diagnosis, procedures, and orders are all accurate and complete.  The entirety of the information documented in the History of Present Illness, Review of Systems and Physical Exam were personally obtained by me. Portions of this information were initially documented by [facility] and reviewed by me for thoroughness and accuracy.     Paulene Floor  [facility] [phone] (phone) [phone] (fax)  [place]

## 2020-06-24 NOTE — Patient Instructions (Signed)

## 2020-06-29 LAB — CULTURE, URINE COMPREHENSIVE

## 2020-07-10 ENCOUNTER — Other Ambulatory Visit: Payer: Self-pay | Admitting: Physician Assistant

## 2020-07-28 NOTE — Progress Notes (Signed)
Established patient visit   Patient: Linda Bradford   DOB: 11/30/1955   64 y.o. Female  MRN: [id number] Visit Date: 07/29/2020  Today's healthcare provider: Margaretann LovelessJennifer M Dalicia Kisner, PA-C   Chief Complaint  Patient presents with  . Tremors   Subjective    HPI  Tremors: patient coming in with worsening symptoms. She can write but her witting now is not legible. Reports that her left hand is worse than the right hand. Mostly appears to be an intention tremor. Worse with lifting things or writing.  Patient Active Problem List   Diagnosis Date Noted  . Chronic obstructive pulmonary disease with acute exacerbation (HCC) 12/31/2019  . Cough 12/31/2019  . Suspected COVID-19 virus infection 12/31/2019  . Antibiotic-induced yeast infection- history of  12/31/2019  . Biliary dyskinesia   . Diverticulitis of large intestine with abscess 05/27/2018  . Diabetes mellitus without complication (HCC) 10/13/2017  . Lateral epicondylitis 07/07/2017  . Personal history of tobacco use, presenting hazards to health 05/03/2016  . Bronchitis, chronic (HCC) 03/03/2015  . Depression, major, recurrent, moderate (HCC) 11/05/2014  . Anxiety, generalized 11/05/2014  . Alcohol abuse, in remission 11/05/2014  . Nicotine addiction 11/05/2014  . H/O: osteoarthritis 11/05/2014  . Fibromyalgia 11/05/2014  . Restless leg syndrome 11/05/2014  . Arthralgia of hip 01/13/2014  . Adiposity 01/13/2014  . Disordered sleep 01/13/2014  . Airway hyperreactivity 12/17/2013  . Adult hypothyroidism 12/17/2013  . Compulsive tobacco user syndrome 12/17/2013  . Current tobacco use 12/17/2013  . Basal cell carcinoma 11/04/2013  . Arthropathia 02/08/2012  . Arthritis of knee, left 02/08/2012   Past Medical History:  Diagnosis Date  . Anxiety   . Arthritis   . Cancer (HCC)    skin cancer, removed  . COPD (chronic obstructive pulmonary disease) (HCC)   . Depression   . Diabetes mellitus without complication (HCC)   .  Dysrhythmia    occasional pvc's. not being treated.  . Fatigue   . GERD (gastroesophageal reflux disease)   . Headache   . Hypertension   . Hypothyroidism   . Thyroid disease        Medications: Outpatient Medications Prior to Visit  Medication Sig  . acetaminophen (TYLENOL) 500 MG tablet Take 1,000 mg by mouth every 6 (six) hours as needed for mild pain or moderate pain.   Marland Kitchen. albuterol (VENTOLIN HFA) 108 (90 Base) MCG/ACT inhaler albuterol sulfate HFA 90 mcg/actuation aerosol inhaler  . bisacodyl (DULCOLAX) 5 MG EC tablet Take as directed  . BREO ELLIPTA 200-25 MCG/INH AEPB INHALE 1 PUFF BY MOUTH ONCE DAILY  . buPROPion (WELLBUTRIN XL) 150 MG 24 hr tablet TAKE 1 TABLET BY MOUTH ONCE DAILY  . clonazePAM (KLONOPIN) 0.5 MG tablet TAKE 1/2 TABLET BY MOUTH TWICE DAILY AS NEEDED  . diclofenac sodium (VOLTAREN) 1 % GEL Apply 4 g topically 4 (four) times daily as needed. (Patient taking differently: Apply 4 g topically 4 (four) times daily as needed. Apply to knees)  . DULoxetine (CYMBALTA) 60 MG capsule TAKE 1 CAPSULE BY MOUTH TWICE DAILY  . ergocalciferol (VITAMIN D2) 1.25 MG (50000 UT) capsule Take 1 capsule (50,000 Units total) by mouth once a week. Once a week on Wednesday  . ezetimibe (ZETIA) 10 MG tablet TAKE 1 TABLET DAILY  . fluconazole (DIFLUCAN) 150 MG tablet Take one pill on day 1. Take second pill 3 days later if still symptomatic.  . fluticasone (FLONASE) 50 MCG/ACT nasal spray USE 2 SPRAYS IN EACH NOSTRIL ONCE DAILY  .  gabapentin (NEURONTIN) 300 MG capsule TAKE 3 CAPSULES BY MOUTH ONCE EVERY MORNING & 5 CAPSULES AT BEDTIME  . glucose blood (CONTOUR NEXT TEST) test strip To check blood sugar once daily  . ibuprofen (ADVIL) 600 MG tablet Take 1 tablet (600 mg total) by mouth every 8 (eight) hours as needed for mild pain or moderate pain.  Marland Kitchen ipratropium-albuterol (DUONEB) 0.5-2.5 (3) MG/3ML SOLN Take 3 mLs by nebulization every 6 (six) hours as needed.  Marland Kitchen levothyroxine (SYNTHROID)  75 MCG tablet TAKE 1 TABLET DAILY ON AN EMPTY STOMACH, WAIT 30 MINUTES BEFORE TAKING OTHER MEDICATIONS  . meclizine (ANTIVERT) 25 MG tablet Take 1 tablet (25 mg total) by mouth 3 (three) times daily as needed for dizziness.  . meloxicam (MOBIC) 15 MG tablet TAKE 1 TABLET BY MOUTH ONCE DAILY (Patient taking differently: Take 15 mg by mouth daily.)  . metFORMIN (GLUCOPHAGE-XR) 500 MG 24 hr tablet TAKE 2 TABLETS BY MOUTH ONCE DAILY WITH SUPPER  . metoprolol succinate (TOPROL-XL) 50 MG 24 hr tablet TAKE 1 TABLET DAILY WITH OR IMMEDIATELY FOLLOWING A MEAL  . nicotine (NICODERM CQ - DOSED IN MG/24 HOURS) 21 mg/24hr patch Place 1 patch (21 mg total) onto the skin daily.  . nitrofurantoin, macrocrystal-monohydrate, (MACROBID) 100 MG capsule TAKE 1 CAPSULE BY MOUTH AT BEDTIME  . nystatin (MYCOSTATIN) 100000 UNIT/ML suspension TAKE 1 TEASPOON BY MOUTH 4 TIMES DAILY  . ondansetron (ZOFRAN) 4 MG tablet TAKE 1 TABLET BY MOUTH EVERY 8 HOURS AS NEEDED  . polyethylene glycol (MIRALAX / GLYCOLAX) packet Take 17 g by mouth daily.  . polyethylene glycol powder (MIRALAX) 17 GM/SCOOP powder Mix full container in 64 ounces of Gatorade or other clear liquid. NO RED  . pyridOXINE (VITAMIN B-6) 100 MG tablet Take 100 mg by mouth daily.  Marland Kitchen THEO-24 300 MG 24 hr capsule TAKE 1 CAPSULE BY MOUTH ONCE DAILY  . tiZANidine (ZANAFLEX) 4 MG tablet TAKE 1 TABLET AT BEDTIME AS NEEDED FOR MUSCLE SPASMS (Patient taking differently: Take 4 mg by mouth at bedtime.)  . traZODone (DESYREL) 100 MG tablet Take 1 tablet (100 mg total) by mouth at bedtime.  . traZODone (DESYREL) 50 MG tablet TAKE 1/2 TO 2 TABLETS BY MOUTH AT BEDTIME AS NEEDED SLEEP  . varenicline (CHANTIX) 0.5 MG tablet Take one 0.5 mg tablet by mouth once daily for 3 days, then increase to one 0.5 mg tablet twice daily for 4 days, then increase to one 1 mg tablet twice daily.  . vitamin B-12 (CYANOCOBALAMIN) 1000 MCG tablet Take 1,000 mcg by mouth daily.   No  facility-administered medications prior to visit.    Review of Systems  Constitutional: Negative.   Respiratory: Negative.   Cardiovascular: Negative.   Gastrointestinal: Negative.   Neurological: Positive for tremors and weakness.    Last CBC Lab Results  Component Value Date   WBC 7.0 07/06/2019   HGB 14.4 07/06/2019   HCT 43.5 07/06/2019   MCV 94.4 07/06/2019   MCH 31.2 07/06/2019   RDW 12.5 07/06/2019   PLT 257 [id number]   Last metabolic panel Lab Results  Component Value Date   GLUCOSE 143 (H) 07/06/2019   NA 138 07/06/2019   K 3.8 07/06/2019   CL 100 07/06/2019   CO2 28 07/06/2019   BUN 14 07/06/2019   CREATININE 0.53 07/06/2019   GFRNONAA >60 07/06/2019   GFRAA >60 07/06/2019   CALCIUM 9.2 07/06/2019   PROT 7.5 07/06/2019   ALBUMIN 3.9 07/06/2019   BILITOT 0.5 07/06/2019  ALKPHOS 95 07/06/2019   AST 20 07/06/2019   ALT 20 07/06/2019   ANIONGAP 10 07/06/2019      Objective    BP 115/70 (BP Location: Left Arm, Patient Position: Sitting, Cuff Size: Large)   Pulse 79   Temp 97.7 F (36.5 C) (Oral)   Resp 16   Wt 213 lb (96.6 kg)   BMI 30.56 kg/m  BP Readings from Last 3 Encounters:  07/29/20 115/70  06/24/20 (!) 128/58  04/29/20 117/60   Wt Readings from Last 3 Encounters:  07/29/20 213 lb (96.6 kg)  06/24/20 220 lb 11.2 oz (100.1 kg)  04/29/20 216 lb 6.4 oz (98.2 kg)      Physical Exam Vitals reviewed.  Constitutional:      General: She is not in acute distress.    Appearance: Normal appearance. She is well-developed and well-nourished. She is obese. She is not ill-appearing or diaphoretic.  Cardiovascular:     Rate and Rhythm: Normal rate and regular rhythm.     Pulses: Normal pulses.     Heart sounds: Normal heart sounds. No murmur heard. No friction rub. No gallop.   Pulmonary:     Effort: Pulmonary effort is normal. No respiratory distress.     Breath sounds: Normal breath sounds. No wheezing or rales.  Musculoskeletal:      Cervical back: Normal range of motion and neck supple.  Neurological:     Mental Status: She is alert.     Motor: Tremor (significant intention tremor; slight rolling of thumb and index finger at rest of the left hand) present.       No results found for any visits on 07/29/20.  Assessment & Plan     1. Tremor of both hands Worsening. Referral to Neuro for further evaluation and testing. We discussed primidone treatment but decided to hold off until after neuro evaluation.  - Ambulatory referral to Neurology  2. Restless leg syndrome Chronic issue. Has seen Neuro for this in the past, but felt she was more brushed off. New neuro referral placed. Issue more prominent is the hand tremors, but wants to address her legs as well. She is on gabapentin for RLS.  - Ambulatory referral to Neurology    No follow-ups on file.      Delmer Islam, PA-C, have reviewed all documentation for this visit. The documentation on 08/03/20 for the exam, diagnosis, procedures, and orders are all accurate and complete.   Reine Just  [facility] [phone] (phone) [phone] (fax)  [facility]

## 2020-07-29 ENCOUNTER — Encounter: Payer: Self-pay | Admitting: Physician Assistant

## 2020-07-29 ENCOUNTER — Other Ambulatory Visit: Payer: Self-pay

## 2020-07-29 ENCOUNTER — Ambulatory Visit (INDEPENDENT_AMBULATORY_CARE_PROVIDER_SITE_OTHER): Payer: BC Managed Care – PPO | Admitting: Physician Assistant

## 2020-07-29 VITALS — BP 115/70 | HR 79 | Temp 97.7°F | Resp 16 | Wt 213.0 lb

## 2020-07-29 DIAGNOSIS — G2581 Restless legs syndrome: Secondary | ICD-10-CM

## 2020-07-29 DIAGNOSIS — R251 Tremor, unspecified: Secondary | ICD-10-CM

## 2020-08-03 ENCOUNTER — Encounter: Payer: Self-pay | Admitting: Physician Assistant

## 2020-08-24 ENCOUNTER — Other Ambulatory Visit: Payer: Self-pay | Admitting: Physician Assistant

## 2020-08-24 DIAGNOSIS — Z9109 Other allergy status, other than to drugs and biological substances: Secondary | ICD-10-CM

## 2020-08-24 DIAGNOSIS — J449 Chronic obstructive pulmonary disease, unspecified: Secondary | ICD-10-CM

## 2020-08-24 DIAGNOSIS — G629 Polyneuropathy, unspecified: Secondary | ICD-10-CM

## 2020-08-29 ENCOUNTER — Other Ambulatory Visit: Payer: Self-pay | Admitting: Physician Assistant

## 2020-08-29 DIAGNOSIS — F32A Depression, unspecified: Secondary | ICD-10-CM

## 2020-08-29 NOTE — Telephone Encounter (Signed)
Requested Prescriptions  Pending Prescriptions Disp Refills  . DULoxetine (CYMBALTA) 60 MG capsule [Pharmacy Med Name: DULOXETINE HCL 60 MG CAP] 180 capsule 0    Sig: TAKE 1 CAPSULE BY MOUTH TWICE DAILY     Psychiatry: Antidepressants - SNRI Passed - 08/29/2020 10:39 AM      Passed - Completed PHQ-2 or PHQ-9 in the last 360 days      Passed - Last BP in normal range    BP Readings from Last 1 Encounters:  07/29/20 115/70         Passed - Valid encounter within last 6 months    Recent Outpatient Visits          1 month ago Tremor of both hands   [place], Clearnce Sorrel, [place]   2 months ago [place], [place], [place]   4 months ago Primary insomnia   [place], [place]   5 months ago Diabetes mellitus without complication [facility])   [place], [place], PA-C   8 months ago COPD with acute exacerbation [facility])   [place] [place] Fl[place], Kelby Aline, FNP

## 2020-09-15 ENCOUNTER — Ambulatory Visit: Payer: BC Managed Care – PPO | Admitting: Physician Assistant

## 2020-09-15 ENCOUNTER — Other Ambulatory Visit: Payer: Self-pay

## 2020-09-15 VITALS — BP 117/71 | HR 73 | Temp 97.7°F | Wt 211.0 lb

## 2020-09-15 DIAGNOSIS — S39012A Strain of muscle, fascia and tendon of lower back, initial encounter: Secondary | ICD-10-CM

## 2020-09-15 DIAGNOSIS — M2550 Pain in unspecified joint: Secondary | ICD-10-CM | POA: Diagnosis not present

## 2020-09-15 MED ORDER — METAXALONE 800 MG PO TABS: 800.0000 mg | ORAL_TABLET | Freq: Three times a day (TID) | ORAL | 0 refills | Status: DC

## 2020-09-15 MED ORDER — KETOROLAC TROMETHAMINE 60 MG/2ML IM SOLN
60.0000 mg | Freq: Once | INTRAMUSCULAR | Status: AC
Start: 2020-09-15 — End: 2020-09-15
  Administered 2020-09-15: 60 mg via INTRAMUSCULAR

## 2020-09-15 MED ORDER — MELOXICAM 15 MG PO TABS: 15.0000 mg | ORAL_TABLET | Freq: Every day | ORAL | 1 refills | Status: DC

## 2020-09-15 MED ORDER — OXYCODONE-ACETAMINOPHEN 5-325 MG PO TABS
1.0000 | ORAL_TABLET | ORAL | 0 refills | Status: DC | PRN
Start: 2020-09-15 — End: 2020-09-24

## 2020-09-15 NOTE — Progress Notes (Signed)
Established patient visit   Patient: Linda Bradford   DOB: June 23, 1956   65 y.o. Female  MRN: [id number] Visit Date: 09/15/2020  Today's healthcare provider: Mar Daring, PA-C   Chief Complaint  Patient presents with  . Back Pain   Subjective    Back Pain This is a new problem. The current episode started in the past 7 days. The problem occurs daily. The problem has been gradually worsening since onset. The pain is present in the lumbar spine. The quality of the pain is described as shooting and aching. The pain does not radiate. The pain is the same all the time. The symptoms are aggravated by standing and lying down. Associated symptoms include weakness. Pertinent negatives include no abdominal pain, bladder incontinence, bowel incontinence, chest pain, headaches or numbness.     Patient Active Problem List   Diagnosis Date Noted  . Chronic obstructive pulmonary disease with acute exacerbation ([place]) 12/31/2019  . Cough 12/31/2019  . Suspected COVID-19 virus infection 12/31/2019  . Antibiotic-induced yeast infection- history of  12/31/2019  . Biliary dyskinesia   . Diverticulitis of large intestine with abscess 05/27/2018  . Diabetes mellitus without complication ([place]) 93/23/5573  . Lateral epicondylitis 07/07/2017  . Personal history of tobacco use, presenting hazards to health 05/03/2016  . Bronchitis, chronic ([place]) 03/03/2015  . Depression, major, recurrent, moderate ([place]) 11/05/2014  . Anxiety, generalized 11/05/2014  . Alcohol abuse, in remission 11/05/2014  . Nicotine addiction 11/05/2014  . H/O: osteoarthritis 11/05/2014  . Fibromyalgia 11/05/2014  . Restless leg syndrome 11/05/2014  . Arthralgia of hip 01/13/2014  . Adiposity 01/13/2014  . Disordered sleep 01/13/2014  . Airway hyperreactivity 12/17/2013  . Adult hypothyroidism 12/17/2013  . Compulsive tobacco user syndrome 12/17/2013  . Current tobacco use 12/17/2013  . Basal cell carcinoma 11/04/2013  .  Arthropathia 02/08/2012  . Arthritis of knee, left 02/08/2012   Past Medical History:  Diagnosis Date  . Anxiety   . Arthritis   . Cancer ([place])    skin cancer, removed  . COPD (chronic obstructive pulmonary disease) ([place])   . Depression   . Diabetes mellitus without complication ([place])   . Dysrhythmia    occasional pvc's. not being treated.  . Fatigue   . GERD (gastroesophageal reflux disease)   . Headache   . Hypertension   . Hypothyroidism   . Thyroid disease    Social History   Tobacco Use  . Smoking status: Current Every Day Smoker    Packs/day: 1.50    Years: 45.00    Pack years: 67.50    Types: Cigarettes    Start date: 03/02/1980  . Smokeless tobacco: Never Used  Vaping Use  . Vaping Use: Former  . Devices: only for a month. did not like  Substance Use Topics  . Alcohol use: No    Alcohol/week: 0.0 standard drinks  . Drug use: No   Allergies  Allergen Reactions  . Dexilant [Dexlansoprazole] Nausea Only and Other (See Comments)    Dizziness     Medications: Outpatient Medications Prior to Visit  Medication Sig  . acetaminophen (TYLENOL) 500 MG tablet Take 1,000 mg by mouth every 6 (six) hours as needed for mild pain or moderate pain.   Marland Kitchen albuterol (VENTOLIN HFA) 108 (90 Base) MCG/ACT inhaler albuterol sulfate HFA 90 mcg/actuation aerosol inhaler  . benzonatate (TESSALON) 200 MG capsule TAKE 1 CAPSULE BY MOUTH 3 TIMES DAILY ASNEEDED  . bisacodyl (DULCOLAX) 5 MG EC tablet Take as  directed  . buPROPion (WELLBUTRIN XL) 150 MG 24 hr tablet TAKE 1 TABLET BY MOUTH ONCE DAILY  . clonazePAM (KLONOPIN) 0.5 MG tablet TAKE 1/2 TABLET BY MOUTH TWICE DAILY AS NEEDED  . diclofenac sodium (VOLTAREN) 1 % GEL Apply 4 g topically 4 (four) times daily as needed. (Patient taking differently: Apply 4 g topically 4 (four) times daily as needed. Apply to knees)  . DULoxetine (CYMBALTA) 60 MG capsule TAKE 1 CAPSULE BY MOUTH TWICE DAILY  . ergocalciferol (VITAMIN D2) 1.25 MG (50000  UT) capsule Take 1 capsule (50,000 Units total) by mouth once a week. Once a week on Wednesday  . ezetimibe (ZETIA) 10 MG tablet TAKE 1 TABLET DAILY  . fluconazole (DIFLUCAN) 150 MG tablet Take one pill on day 1. Take second pill 3 days later if still symptomatic.  . fluticasone (FLONASE) 50 MCG/ACT nasal spray USE 2 SPRAYS IN EACH NOSTRIL ONCE DAILY  . gabapentin (NEURONTIN) 300 MG capsule TAKE 3 CAPSULES BY MOUTH ONCE EVERY MORNING AND 5 CAPSULES AT BEDTIME  . glucose blood (CONTOUR NEXT TEST) test strip To check blood sugar once daily  . ibuprofen (ADVIL) 600 MG tablet Take 1 tablet (600 mg total) by mouth every 8 (eight) hours as needed for mild pain or moderate pain.  Marland Kitchen ipratropium-albuterol (DUONEB) 0.5-2.5 (3) MG/3ML SOLN Take 3 mLs by nebulization every 6 (six) hours as needed.  Marland Kitchen levothyroxine (SYNTHROID) 75 MCG tablet TAKE 1 TABLET DAILY ON AN EMPTY STOMACH, WAIT 30 MINUTES BEFORE TAKING OTHER MEDICATIONS  . meclizine (ANTIVERT) 25 MG tablet Take 1 tablet (25 mg total) by mouth 3 (three) times daily as needed for dizziness.  . metFORMIN (GLUCOPHAGE-XR) 500 MG 24 hr tablet TAKE 2 TABLETS BY MOUTH ONCE DAILY WITH SUPPER  . metoprolol succinate (TOPROL-XL) 50 MG 24 hr tablet TAKE 1 TABLET DAILY WITH OR IMMEDIATELY FOLLOWING A MEAL  . nicotine (NICODERM CQ - DOSED IN MG/24 HOURS) 21 mg/24hr patch Place 1 patch (21 mg total) onto the skin daily.  . nitrofurantoin, macrocrystal-monohydrate, (MACROBID) 100 MG capsule TAKE 1 CAPSULE BY MOUTH AT BEDTIME  . nystatin (MYCOSTATIN) 100000 UNIT/ML suspension TAKE 1 TEASPOON BY MOUTH 4 TIMES DAILY  . ondansetron (ZOFRAN) 4 MG tablet TAKE 1 TABLET BY MOUTH EVERY 8 HOURS AS NEEDED  . polyethylene glycol (MIRALAX / GLYCOLAX) packet Take 17 g by mouth daily.  . polyethylene glycol powder (MIRALAX) 17 GM/SCOOP powder Mix full container in 64 ounces of Gatorade or other clear liquid. NO RED  . pyridOXINE (VITAMIN B-6) 100 MG tablet Take 100 mg by mouth daily.   Marland Kitchen THEO-24 300 MG 24 hr capsule TAKE 1 CAPSULE BY MOUTH ONCE DAILY  . traZODone (DESYREL) 100 MG tablet Take 1 tablet (100 mg total) by mouth at bedtime.  . varenicline (CHANTIX) 0.5 MG tablet Take one 0.5 mg tablet by mouth once daily for 3 days, then increase to one 0.5 mg tablet twice daily for 4 days, then increase to one 1 mg tablet twice daily.  . vitamin B-12 (CYANOCOBALAMIN) 1000 MCG tablet Take 1,000 mcg by mouth daily.  . [DISCONTINUED] BREO ELLIPTA 200-25 MCG/INH AEPB INHALE 1 PUFF BY MOUTH ONCE DAILY  . [DISCONTINUED] traZODone (DESYREL) 50 MG tablet TAKE 1/2 TO 2 TABLETS BY MOUTH AT BEDTIME AS NEEDED SLEEP  . [DISCONTINUED] meloxicam (MOBIC) 15 MG tablet TAKE 1 TABLET BY MOUTH ONCE DAILY (Patient taking differently: Take 15 mg by mouth daily.)  . [DISCONTINUED] tiZANidine (ZANAFLEX) 4 MG tablet TAKE 1 TABLET  AT BEDTIME AS NEEDED FOR MUSCLE SPASMS (Patient taking differently: Take 4 mg by mouth at bedtime.)   No facility-administered medications prior to visit.    Review of Systems  Constitutional: Negative.   Respiratory: Negative.   Cardiovascular: Negative for chest pain, palpitations and leg swelling.  Gastrointestinal: Negative.  Negative for abdominal pain and bowel incontinence.  Genitourinary: Negative.  Negative for bladder incontinence.  Musculoskeletal: Positive for back pain.  Neurological: Positive for weakness. Negative for dizziness, light-headedness, numbness and headaches.        Objective    BP 117/71 (BP Location: Right Arm, Patient Position: Sitting, Cuff Size: Large)   Pulse 73   Temp 97.7 F (36.5 C) (Oral)   Wt 211 lb (95.7 kg)   BMI 30.28 kg/m    Physical Exam Vitals reviewed.  Constitutional:      General: She is not in acute distress.    Appearance: Normal appearance. She is well-developed and well-nourished. She is not ill-appearing.  HENT:     Head: Normocephalic and atraumatic.  Eyes:     Extraocular Movements: EOM normal.   Pulmonary:     Effort: Pulmonary effort is normal. No respiratory distress.  Musculoskeletal:     Cervical back: Normal range of motion and neck supple. No tenderness.     Lumbar back: Spasms and tenderness present. No bony tenderness. Decreased range of motion. Negative right straight leg raise test and negative left straight leg raise test.     Right lower leg: No edema.     Left lower leg: No edema.  Skin:    General: Skin is warm and dry.     Capillary Refill: Capillary refill takes less than 2 seconds.  Neurological:     Mental Status: She is alert.  Psychiatric:        Mood and Affect: Mood and affect normal.        Behavior: Behavior normal.        Thought Content: Thought content normal.        Judgment: Judgment normal.     No results found for any visits on 09/15/20.  Assessment & Plan     1. Strain of lumbar region, initial encounter Toradol 60mg  IM given as below. Meloxicam refilled. Moist heating pad. Stretches and exercises printed. Call if worsening.  - ketorolac (TORADOL) injection 60 mg  2. Arthralgia, unspecified joint See above medical treatment plan. - meloxicam (MOBIC) 15 MG tablet; Take 1 tablet (15 mg total) by mouth daily.  Dispense: 90 tablet; Refill: 1   No follow-ups on file.      Reynolds Bowl, PA-C, have reviewed all documentation for this visit. The documentation on 09/27/20 for the exam, diagnosis, procedures, and orders are all accurate and complete.   Rubye Beach  [facility] [phone] (phone) [phone] (fax)  [place]

## 2020-09-15 NOTE — Patient Instructions (Signed)
Lumbar Strain A lumbar strain, which is sometimes called a low-back strain, is a stretch or tear in a muscle or the strong cords of tissue that attach muscle to bone (tendons) in the lower back (lumbar spine). This type of injury occurs when muscles or tendons are torn or are stretched beyond their limits. Lumbar strains can range from mild to severe. Mild strains may involve stretching a muscle or tendon without tearing it. These may heal in 1-2 weeks. More severe strains involve tearing of muscle fibers or tendons. These will cause more pain and may take 6-8 weeks to heal. What are the causes? This condition may be caused by:  Trauma, such as a fall or a hit to the body.  Twisting or overstretching the back. This may result from doing activities that need a lot of energy, such as lifting heavy objects. What increases the risk? This injury is more common in:  Athletes.  People with obesity.  People who do repeated lifting, bending, or other movements that involve their back. What are the signs or symptoms? Symptoms of this condition may include:  Sharp or dull pain in the lower back that does not go away. The pain may extend to the buttocks.  Stiffness or limited range of motion.  Sudden muscle tightening (spasms). How is this diagnosed? This condition may be diagnosed based on:  Your symptoms.  Your medical history.  A physical exam.  Imaging tests, such as: ? X-rays. ? MRI. How is this treated? Treatment for this condition may include:  Rest.  Applying heat and cold to the affected area.  Over-the-counter medicines to help relieve pain and inflammation, such as NSAIDs.  Prescription pain medicine and muscle relaxants may be needed for a short time.  Physical therapy. Follow these instructions at home: Managing pain, stiffness, and swelling  If directed, put ice on the injured area during the first 24 hours after your injury. ? Put ice in a plastic bag. ? Place  a towel between your skin and the bag. ? Leave the ice on for 20 minutes, 2-3 times a day.  If directed, apply heat to the affected area as often as told by your health care provider. Use the heat source that your health care provider recommends, such as a moist heat pack or a heating pad. ? Place a towel between your skin and the heat source. ? Leave the heat on for 20-30 minutes. ? Remove the heat if your skin turns bright red. This is especially important if you are unable to feel pain, heat, or cold. You may have a greater risk of getting burned.      Activity  Rest and return to your normal activities as told by your health care provider. Ask your health care provider what activities are safe for you.  Do exercises as told by your health care provider. Medicines  Take over-the-counter and prescription medicines only as told by your health care provider.  Ask your health care provider if the medicine prescribed to you: ? Requires you to avoid driving or using heavy machinery. ? Can cause constipation. You may need to take these actions to prevent or treat constipation:  Drink enough fluid to keep your urine pale yellow.  Take over-the-counter or prescription medicines.  Eat foods that are high in fiber, such as beans, whole grains, and fresh fruits and vegetables.  Limit foods that are high in fat and processed sugars, such as fried or sweet foods. Injury prevention To   prevent a future low-back injury:  Always warm up properly before physical activity or sports.  Cool down and stretch after being active.  Use correct form when playing sports and lifting heavy objects. Bend your knees before you lift heavy objects.  Use good posture when sitting and standing.  Stay physically fit and keep a healthy weight. ? Do at least 150 minutes of moderate-intensity exercise each week, such as brisk walking or water aerobics. ? Do strength exercises at least 2 times each week.    General instructions  Do not use any products that contain nicotine or tobacco, such as cigarettes, e-cigarettes, and chewing tobacco. If you need help quitting, ask your health care provider.  Keep all follow-up visits as told by your health care provider. This is important. Contact a health care provider if:  Your back pain does not improve after 6 weeks of treatment.  Your symptoms get worse. Get help right away if:  Your back pain is severe.  You are unable to stand or walk.  You develop pain in your legs.  You develop weakness in your buttocks or legs.  You have difficulty controlling when you urinate or when you have a bowel movement. ? You have frequent, painful, or bloody urination. ? You have a temperature over 101.0F (38.3C) Summary  A lumbar strain, which is sometimes called a low-back strain, is a stretch or tear in a muscle or the strong cords of tissue that attach muscle to bone (tendons) in the lower back (lumbar spine).  This type of injury occurs when muscles or tendons are torn or are stretched beyond their limits.  Rest and return to your normal activities as told by your health care provider. If directed, apply heat and ice to the affected area as often as told by your health care provider.  Take over-the-counter and prescription medicines only as told by your health care provider.  Contact a health care provider if you have new or worsening symptoms. This information is not intended to replace advice given to you by your health care provider. Make sure you discuss any questions you have with your health care provider. Document Revised: 05/17/2018 Document Reviewed: 05/17/2018 Elsevier Patient Education  2021 Elsevier Inc.  

## 2020-09-21 ENCOUNTER — Other Ambulatory Visit: Payer: Self-pay | Admitting: Physician Assistant

## 2020-09-21 DIAGNOSIS — J418 Mixed simple and mucopurulent chronic bronchitis: Secondary | ICD-10-CM

## 2020-09-24 ENCOUNTER — Other Ambulatory Visit: Payer: Self-pay | Admitting: Physician Assistant

## 2020-09-24 DIAGNOSIS — S39012A Strain of muscle, fascia and tendon of lower back, initial encounter: Secondary | ICD-10-CM

## 2020-09-24 MED ORDER — OXYCODONE-ACETAMINOPHEN 5-325 MG PO TABS: 1.0000 | ORAL_TABLET | ORAL | 0 refills | Status: DC | PRN

## 2020-09-24 NOTE — Telephone Encounter (Signed)
Requested medication (s) are due for refill today: no  Requested medication (s) are on the active medication list: yes   Last refill:  09/15/2020  Future visit scheduled:  no  Notes to clinic: this refill cannot be delegated    Requested Prescriptions  Pending Prescriptions Disp Refills   metaxalone (SKELAXIN) 800 MG tablet [Pharmacy Med Name: METAXALONE 800 MG TAB] 30 tablet 0    Sig: TAKE 1 TABLET BY MOUTH 3 TIMES DAILY      Not Delegated - Analgesics:  Muscle Relaxants Failed - 09/24/2020  1:14 PM      Failed - This refill cannot be delegated      Passed - Valid encounter within last 6 months    Recent Outpatient Visits           1 week ago Strain of lumbar region, initial encounter   [place], PA-C   1 month ago Tremor of both hands   [place], [place], [place]   3 months ago [place], [place], [place]   4 months ago Primary insomnia   [facility] Malling M, [place]   6 months ago Diabetes mellitus without complication [facility])   [place], Anderson Malta M, [place]                  oxyCODONE-acetaminophen (PERCOCET/ROXICET) 5-325 MG tablet [Pharmacy Med Name: OXYCODONE-APAP 5-325 MG TAB] 30 tablet     Sig: TAKE 1 TABLET BY MOUTH EVERY 4 HOURS AS NEEDED SEVERE PAIN      Not Delegated - Analgesics:  Opioid Agonist Combinations Failed - 09/24/2020  1:14 PM      Failed - This refill cannot be delegated      Failed - Urine Drug Screen completed in last 360 days      Passed - Valid encounter within last 6 months    Recent Outpatient Visits           1 week ago Strain of lumbar region, initial encounter   [place], Clearnce Sorrel, [place]   1 month ago Tremor of both hands   [place], [place], [place]   3 months ago [place], [place], [place]   4  months ago Primary insomnia   [place], [place]   6 months ago Diabetes mellitus without complication [facility])   [facility] [place], [place], [place]

## 2020-09-24 NOTE — Telephone Encounter (Signed)
Requested medication (s) are due for refill today: yes  Requested medication (s) are on the active medication list: yes   Future visit scheduled: no   Notes to clinic:  this refill cannot be delegated    Requested Prescriptions  Pending Prescriptions Disp Refills   oxyCODONE-acetaminophen (PERCOCET/ROXICET) 5-325 MG tablet 30 tablet 0    Sig: Take 1 tablet by mouth every 4 (four) hours as needed for severe pain.      There is no refill protocol information for this order

## 2020-09-24 NOTE — Telephone Encounter (Signed)
Copied from [place] [id number]. Topic: Quick Communication - Rx Refill/Question >> Sep 24, 2020  1:17 PM Leward Quan A wrote: Medication: oxyCODONE-acetaminophen (PERCOCET/ROXICET) 5-325 MG tablet  Still having back pain   Has the patient contacted their pharmacy? Yes.   (Agent: If no, request that the patient contact the pharmacy for the refill.) (Agent: If yes, when and what did the pharmacy advise?)  Preferred Pharmacy (with phone number or street name): [facility] [place], [place].  Phone:  [phone] Fax:  [phone]     Agent: Please be advised that RX refills may take up to 3 business days. We ask that you follow-up with your pharmacy.

## 2020-09-27 ENCOUNTER — Encounter: Payer: Self-pay | Admitting: Physician Assistant

## 2020-09-29 ENCOUNTER — Other Ambulatory Visit: Payer: Self-pay | Admitting: Physician Assistant

## 2020-09-29 DIAGNOSIS — E119 Type 2 diabetes mellitus without complications: Secondary | ICD-10-CM

## 2020-09-30 ENCOUNTER — Other Ambulatory Visit: Payer: Self-pay | Admitting: Neurology

## 2020-09-30 DIAGNOSIS — G250 Essential tremor: Secondary | ICD-10-CM

## 2020-10-09 ENCOUNTER — Telehealth: Payer: Self-pay | Admitting: *Deleted

## 2020-10-09 DIAGNOSIS — B370 Candidal stomatitis: Secondary | ICD-10-CM

## 2020-10-09 NOTE — Telephone Encounter (Signed)
Please advise 

## 2020-10-09 NOTE — Telephone Encounter (Signed)
Copied from [place] [id number]. Topic: General - Other >> Oct 09, 2020 11:53 AM [place], Brayton Layman A wrote: Reason for CRM: Pt stated she has yeast infection in her mouth and throat and needs Anderson Malta to call in some prescription mouth wash. Please advise.

## 2020-10-12 MED ORDER — NYSTATIN 100000 UNIT/ML MT SUSP: OROMUCOSAL | 0 refills | Status: DC

## 2020-10-12 NOTE — Telephone Encounter (Signed)
Sent in Nystatin to [facility] for her

## 2020-10-17 ENCOUNTER — Ambulatory Visit: Payer: BC Managed Care – PPO

## 2020-10-19 ENCOUNTER — Other Ambulatory Visit: Payer: Self-pay | Admitting: Physician Assistant

## 2020-10-20 ENCOUNTER — Other Ambulatory Visit: Payer: Self-pay | Admitting: Physician Assistant

## 2020-10-20 DIAGNOSIS — S39012A Strain of muscle, fascia and tendon of lower back, initial encounter: Secondary | ICD-10-CM

## 2020-10-20 NOTE — Telephone Encounter (Signed)
Requested medication (s) are due for refill today: yes  Requested medication (s) are on the active medication list: yes  Last refill:  09/24/2020  Future visit scheduled: No  Notes to clinic: this refill cannot be delegated    Requested Prescriptions  Pending Prescriptions Disp Refills   metaxalone (SKELAXIN) 800 MG tablet [Pharmacy Med Name: METAXALONE 800 MG TAB] 30 tablet 0    Sig: TAKE 1 TABLET BY MOUTH 3 TIMES DAILY      Not Delegated - Analgesics:  Muscle Relaxants Failed - 10/20/2020  8:50 AM      Failed - This refill cannot be delegated      Passed - Valid encounter within last 6 months    Recent Outpatient Visits           1 month ago Strain of lumbar region, initial encounter   [place], [place]   2 months ago Tremor of both hands   [place], [place], [place]   3 months ago [place], [place], [place]   5 months ago Primary insomnia   [place], [place]   7 months ago Diabetes mellitus without complication [facility])   [facility] [place], [place], [place]

## 2020-11-16 ENCOUNTER — Other Ambulatory Visit: Payer: Self-pay | Admitting: Physician Assistant

## 2020-11-16 DIAGNOSIS — F331 Major depressive disorder, recurrent, moderate: Secondary | ICD-10-CM

## 2020-11-16 DIAGNOSIS — F419 Anxiety disorder, unspecified: Secondary | ICD-10-CM

## 2020-11-16 DIAGNOSIS — F411 Generalized anxiety disorder: Secondary | ICD-10-CM

## 2020-11-16 DIAGNOSIS — F5101 Primary insomnia: Secondary | ICD-10-CM

## 2020-11-17 NOTE — Telephone Encounter (Signed)
Requested medication (s) are due for refill today: no  Requested medication (s) are on the active medication list: yes  Last refill: 10/19/2020  Future visit scheduled: no  Notes to clinic: several medication were just filled on 10/19/2020 and not due yet  One refill cannot be delegated    Requested Prescriptions  Pending Prescriptions Disp Refills   traZODone (DESYREL) 100 MG tablet [Pharmacy Med Name: TRAZODONE HCL 100 MG TAB] 90 tablet 1    Sig: TAKE 1 TABLET BY MOUTH AT BEDTIME      Psychiatry: Antidepressants - Serotonin Modulator Passed - 11/16/2020  3:49 PM      Passed - Completed PHQ-2 or PHQ-9 in the last 360 days      Passed - Valid encounter within last 6 months    Recent Outpatient Visits           2 months ago Strain of lumbar region, initial encounter   [place], PA-C   3 months ago Tremor of both hands   [place], Clearnce Sorrel, [place]   4 months ago [place], [place], [place]   6 months ago Primary insomnia   [facility] Fenton Malling M, [place]   8 months ago Diabetes mellitus without complication [facility])   [facility] [place] Corporal, Jennifer M, [place]                  buPROPion (WELLBUTRIN XL) 150 MG 24 hr tablet [Pharmacy Med Name: BUPROPION HCL ER (XL) 150 MG TAB] 90 tablet 1    Sig: TAKE 1 TABLET BY MOUTH ONCE DAILY      Psychiatry: Antidepressants - bupropion Passed - 11/16/2020  3:49 PM      Passed - Completed PHQ-2 or PHQ-9 in the last 360 days      Passed - Last BP in normal range    BP Readings from Last 1 Encounters:  09/15/20 117/71          Passed - Valid encounter within last 6 months    Recent Outpatient Visits           2 months ago Strain of lumbar region, initial encounter   [place], Clearnce Sorrel, [place]   3 months ago Tremor of both hands   [place],  [place], [place]   4 months ago [place], [place], [place]   6 months ago Primary insomnia   [place], [place], [place]   8 months ago Diabetes mellitus without complication [facility])   [place], Anderson Malta M, PA-C                  albuterol (VENTOLIN HFA) 108 (90 Base) MCG/ACT inhaler [Pharmacy Med Name: ALBUTEROL SULFATE HFA 108 (90 BASE)] 8.5 g     Sig: INHALE 2 PUFFS INTO THE LUNGS EVERY 4 HOURS AS NEEDED FOR WHEEZING OR SHORTNESS OF BREATH      Pulmonology:  Beta Agonists Failed - 11/16/2020  3:49 PM      Failed - One inhaler should last at least one month. If the patient is requesting refills earlier, contact the patient to check for uncontrolled symptoms.      Passed - Valid encounter within last 12 months    Recent Outpatient Visits           2 months ago Strain of lumbar region, initial  encounter   [place], [place]   3 months ago Tremor of both hands   [facility] Mar Daring, [place]   4 months ago [place], [place], [place]   6 months ago Primary insomnia   [facility] Fenton Malling M, [place]   8 months ago Diabetes mellitus without complication [facility])   [place], Anderson Malta M, PA-C                  clonazePAM (KLONOPIN) 0.5 MG tablet [Pharmacy Med Name: CLONAZEPAM 0.5 MG TAB] 180 tablet     Sig: TAKE 1/2 TABLET BY MOUTH TWICE DAILY AS NEEDED      Not Delegated - Psychiatry:  Anxiolytics/Hypnotics Failed - 11/16/2020  3:49 PM      Failed - This refill cannot be delegated      Failed - Urine Drug Screen completed in last 360 days      Passed - Valid encounter within last 6 months    Recent Outpatient Visits           2 months ago Strain of lumbar region, initial encounter   [place], [place]   3  months ago Tremor of both hands   [place], [place], [place]   4 months ago [place], [place], [place]   6 months ago Primary insomnia   [place], [place]   8 months ago Diabetes mellitus without complication [facility])   [facility] [place], [place], [place]

## 2020-12-06 ENCOUNTER — Other Ambulatory Visit: Payer: Self-pay | Admitting: Physician Assistant

## 2020-12-06 DIAGNOSIS — F32A Depression, unspecified: Secondary | ICD-10-CM

## 2020-12-16 ENCOUNTER — Telehealth: Payer: Self-pay

## 2020-12-16 DIAGNOSIS — I10 Essential (primary) hypertension: Secondary | ICD-10-CM

## 2020-12-16 DIAGNOSIS — G250 Essential tremor: Secondary | ICD-10-CM

## 2020-12-16 MED ORDER — METOPROLOL SUCCINATE ER 50 MG PO TB24: 50.0000 mg | ORAL_TABLET | Freq: Every day | ORAL | 1 refills | Status: DC

## 2020-12-16 NOTE — Telephone Encounter (Signed)
Copied from [place] [phone]. Topic: Quick Communication - Rx Refill/Question >> Dec 16, 2020 10:07 AM Loma [place] wrote: Medication: metoprolol succinate (TOPROL-XL) 50 MG 24 hr tablet 90 tablet 3 04/19/2020   Sig: TAKE 1 TABLET DAILY WITH OR IMMEDIATELY FOLLOWING A MEAL  Sent to pharmacy as: metoprolol succinate (TOPROL-XL) 50 MG 24 hr tablet  Pt has CPE with Chrismon in June   Has the patient contacted their pharmacy? y (Agent: If no, request that the patient contact the pharmacy for the refill.) (Agent: If yes, when and what did the pharmacy advise?) call dr   Preferred Pharmacy (with phone number or street name): [facility] - [place], [place] [address] Phone: [phone] Fax: [phone] Hours: Not open 24 hours    Agent: Please be advised that RX refills may take up to 3 business days. We ask that you follow-up with your pharmacy.

## 2020-12-16 NOTE — Addendum Note (Signed)
Addended by: Ashley Royalty E on: 12/16/2020 11:19 AM   Modules accepted: Orders

## 2021-01-08 ENCOUNTER — Other Ambulatory Visit: Payer: Self-pay | Admitting: Physician Assistant

## 2021-01-08 ENCOUNTER — Other Ambulatory Visit: Payer: Self-pay | Admitting: Family Medicine

## 2021-01-08 DIAGNOSIS — E119 Type 2 diabetes mellitus without complications: Secondary | ICD-10-CM

## 2021-01-08 DIAGNOSIS — E039 Hypothyroidism, unspecified: Secondary | ICD-10-CM

## 2021-01-08 MED ORDER — LEVOTHYROXINE SODIUM 75 MCG PO TABS
ORAL_TABLET | ORAL | 0 refills | Status: DC
Start: 2021-01-08 — End: 2021-02-12

## 2021-01-08 NOTE — Telephone Encounter (Signed)
Medication Refill - Medication: Levothyroxine 75 mcg #30 with refills Pt has an appt with dennis on 01-29-2021  Has the patient contacted their pharmacy? No. [facility]   Preferred Pharmacy (with phone number or street name): [facility] [address] [address] in [place]: Please be advised that RX refills may take up to 3 business days. We ask that you follow-up with your pharmacy.

## 2021-01-12 NOTE — Telephone Encounter (Signed)
Pt called in stating she is completely out of her medication, and was wanting to know if she can get a temporary refill until her appt on July 1st. Pt requested a call back, please advise.   [place], [place]. Phone:  [phone]  Fax:  [phone]

## 2021-01-29 ENCOUNTER — Encounter: Payer: BC Managed Care – PPO | Admitting: Family Medicine

## 2021-02-03 ENCOUNTER — Other Ambulatory Visit: Payer: Self-pay | Admitting: Student

## 2021-02-03 DIAGNOSIS — M79605 Pain in left leg: Secondary | ICD-10-CM

## 2021-02-12 ENCOUNTER — Other Ambulatory Visit: Payer: Self-pay | Admitting: Family Medicine

## 2021-02-12 DIAGNOSIS — E039 Hypothyroidism, unspecified: Secondary | ICD-10-CM

## 2021-02-12 NOTE — Telephone Encounter (Signed)
Requested medication (s) are due for refill today:  yes  Requested medication (s) are on the active medication list:  yes  Last refill:  01/08/2021  Future visit scheduled: no  Notes to clinic:  Failed protocol: TSH needs to be rechecked within 3 months after an abnormal result. Refill until TSH is due   Requested Prescriptions  Pending Prescriptions Disp Refills   levothyroxine (SYNTHROID) 75 MCG tablet [Pharmacy Med Name: LEVOTHYROXINE SODIUM 75 MCG TAB] 30 tablet 0    Sig: TAKE 1 TABLET BY MOUTH ONCE DAILY ON AN EMPTY STOMACH. WAIT 30 MINUTES BEFORE TAKING OTHER MEDS.      Endocrinology:  Hypothyroid Agents Failed - 02/12/2021 11:34 AM      Failed - TSH needs to be rechecked within 3 months after an abnormal result. Refill until TSH is due.      Failed - TSH in normal range and within 360 days    TSH  Date Value Ref Range Status  05/19/2017 2.16 0.40 - 4.50 mIU/L Final          Passed - Valid encounter within last 12 months    Recent Outpatient Visits           5 months ago Strain of lumbar region, initial encounter   [place], [place]   6 months ago Tremor of both hands   [place], Clearnce Sorrel, [place]   7 months ago [place], [place], [place]   9 months ago Primary insomnia   [place], [place]   11 months ago Diabetes mellitus without complication [facility])   [facility] [place], [place], [place]

## 2021-02-14 ENCOUNTER — Other Ambulatory Visit: Payer: Self-pay | Admitting: Physician Assistant

## 2021-02-14 DIAGNOSIS — S39012A Strain of muscle, fascia and tendon of lower back, initial encounter: Secondary | ICD-10-CM

## 2021-02-15 ENCOUNTER — Ambulatory Visit: Payer: Medicare PPO

## 2021-02-15 ENCOUNTER — Other Ambulatory Visit: Payer: Self-pay | Admitting: Family Medicine

## 2021-02-15 ENCOUNTER — Other Ambulatory Visit: Payer: Self-pay | Admitting: Physician Assistant

## 2021-02-15 DIAGNOSIS — G629 Polyneuropathy, unspecified: Secondary | ICD-10-CM

## 2021-02-15 DIAGNOSIS — F32A Depression, unspecified: Secondary | ICD-10-CM

## 2021-02-15 DIAGNOSIS — E039 Hypothyroidism, unspecified: Secondary | ICD-10-CM

## 2021-02-15 DIAGNOSIS — Z9109 Other allergy status, other than to drugs and biological substances: Secondary | ICD-10-CM

## 2021-02-15 DIAGNOSIS — E119 Type 2 diabetes mellitus without complications: Secondary | ICD-10-CM

## 2021-02-15 DIAGNOSIS — F5101 Primary insomnia: Secondary | ICD-10-CM

## 2021-02-15 NOTE — Telephone Encounter (Signed)
Metformin last RF 01/12/21 #60 overdue lab work Cymbalta: 12/07/20 #180 to soon. Needs office visit. Will notify pt to call office via [place]. Requested Prescriptions  Pending Prescriptions Disp Refills   metFORMIN (GLUCOPHAGE-XR) 500 MG 24 hr tablet [Pharmacy Med Name: METFORMIN HCL ER 500 MG TAB] 60 tablet 0    Sig: TAKE 2 TABLETS BY MOUTH ONCE DAILY WITH SUPPER      Endocrinology:  Diabetes - Biguanides Failed - 02/15/2021 12:13 PM      Failed - Cr in normal range and within 360 days    Creat  Date Value Ref Range Status  05/19/2017 0.58 0.50 - 0.99 mg/dL Final    Comment:    For patients >84 years of age, the reference limit for Creatinine is approximately 13% higher for people identified as African-American. .    Creatinine, Ser  Date Value Ref Range Status  07/06/2019 0.53 0.44 - 1.00 mg/dL Final          Failed - HBA1C is between 0 and 7.9 and within 180 days    Hemoglobin A1C  Date Value Ref Range Status  03/19/2020 7.0 (A) 4.0 - 5.6 % Final   Hgb A1c MFr Bld  Date Value Ref Range Status  05/19/2017 7.5 (H) <5.7 % of total Hgb Final    Comment:    For someone without known diabetes, a hemoglobin A1c value of 6.5% or greater indicates that they may have  diabetes and this should be confirmed with a follow-up  test. . For someone with known diabetes, a value <7% indicates  that their diabetes is well controlled and a value  greater than or equal to 7% indicates suboptimal  control. A1c targets should be individualized based on  duration of diabetes, age, comorbid conditions, and  other considerations. . Currently, no consensus exists regarding use of hemoglobin A1c for diagnosis of diabetes for children. .           Failed - eGFR in normal range and within 360 days    GFR, Est African American  Date Value Ref Range Status  05/19/2017 115 > OR = 60 mL/min/1.61m Final   GFR calc Af Amer  Date Value Ref Range Status  07/06/2019 >60 >60 mL/min Final    GFR, Est Non African American  Date Value Ref Range Status  05/19/2017 99 > OR = 60 mL/min/1.759mFinal   GFR calc non Af Amer  Date Value Ref Range Status  07/06/2019 >60 >60 mL/min Final          Passed - Valid encounter within last 6 months    Recent Outpatient Visits           5 months ago Strain of lumbar region, initial encounter   [facility]   6 months ago Tremor of both hands   [facility] 7 months ago [facility] 9 months ago Primary insomnia   [facility] 11 months [facility] without [facility]  [facility]                DULoxetine (CYMBALTA) 60 MG capsule [Pharmacy Med Name: DULOXETINE HCL 60 MG CAP] 180 capsule 0    Sig: TAKE 1 CAPSULE BY MOUTH TWICE DAILY      Psychiatry: Antidepressants - SNRI  Passed - 02/15/2021 12:13 PM      Passed - Completed PHQ-2 or PHQ-9 in the last 360 days      Passed - Last BP in normal range    BP Readings from Last 1 Encounters:  09/15/20 117/71          Passed - Valid encounter within last 6 months    Recent Outpatient Visits           5 months ago Strain of lumbar region, initial encounter   [facility] [place], Jennifer M, PA-C   6 months ago Tremor of both hands   [facility] Burnette, Jennifer M, PA-C   7 months ago Dysuria   [facility] [place], Adriana M, PA-C   9 months ago Primary insomnia   [facility] Burnette, Jennifer M, PA-C   11 months ago Diabetes mellitus without complication (HCC)   [facility] Burnette, Jennifer M, PA-C                   

## 2021-02-15 NOTE — Telephone Encounter (Signed)
Last RF 11/18/20 90/1

## 2021-03-02 ENCOUNTER — Other Ambulatory Visit: Payer: Self-pay | Admitting: Family Medicine

## 2021-03-02 DIAGNOSIS — F411 Generalized anxiety disorder: Secondary | ICD-10-CM

## 2021-03-02 DIAGNOSIS — F331 Major depressive disorder, recurrent, moderate: Secondary | ICD-10-CM

## 2021-03-02 DIAGNOSIS — F5101 Primary insomnia: Secondary | ICD-10-CM

## 2021-03-02 NOTE — Telephone Encounter (Signed)
Requested medications are due for refill today.  yes  Requested medications are on the active medications list.  yes  Last refill. 11/18/2020 for both  Future visit scheduled.   no  Notes to clinic.  PCP listed as Fenton Malling.  FYI pt also has an RX for trazodone 50 mg.

## 2021-03-03 NOTE — Telephone Encounter (Signed)
Please advise refill? LOV: 09/15/2020 NOV: None  Bupropion 150 mg Last refill: 11/18/2020 #90 with 1 refill  Trazodone 100 mg  Last refill: 11/18/2020 # 90 with 1 refill

## 2021-03-15 DIAGNOSIS — S300XXA Contusion of lower back and pelvis, initial encounter: Secondary | ICD-10-CM | POA: Insufficient documentation

## 2021-03-18 ENCOUNTER — Other Ambulatory Visit: Payer: Self-pay | Admitting: Family Medicine

## 2021-03-18 DIAGNOSIS — E039 Hypothyroidism, unspecified: Secondary | ICD-10-CM

## 2021-03-18 DIAGNOSIS — E119 Type 2 diabetes mellitus without complications: Secondary | ICD-10-CM

## 2021-03-18 DIAGNOSIS — F32A Depression, unspecified: Secondary | ICD-10-CM

## 2021-03-18 NOTE — Telephone Encounter (Signed)
Courtesy refill. appt scheduled for 04/01/21 . Needs updated lab work

## 2021-03-18 NOTE — Telephone Encounter (Signed)
Pt has an appointment with Simona Huh 09/01   Thanks,   -Mickel Baas

## 2021-03-18 NOTE — Telephone Encounter (Signed)
Requested medication (s) are due for refill today:  yes   Requested medication (s) are on the active medication list: yes   Last refill: 02/12/2021  Future visit scheduled: yes  Notes to clinic:  Failed protocol: TSH needs to be rechecked within 3 months after an abnormal result. Refill until TSH is due   Requested Prescriptions  Pending Prescriptions Disp Refills   levothyroxine (SYNTHROID) 75 MCG tablet [Pharmacy Med Name: LEVOTHYROXINE SODIUM 75 MCG TAB] 30 tablet 0    Sig: TAKE 1 TABLET BY MOUTH ONCE DAILY ON AN EMPTY STOMACH. WAIT 30 MINUTES BEFORE TAKING OTHER MEDS.     Endocrinology:  Hypothyroid Agents Failed - 03/18/2021 12:37 PM      Failed - TSH needs to be rechecked within 3 months after an abnormal result. Refill until TSH is due.      Failed - TSH in normal range and within 360 days    TSH  Date Value Ref Range Status  05/19/2017 2.16 0.40 - 4.50 mIU/L Final          Passed - Valid encounter within last 12 months    Recent Outpatient Visits           6 months ago Strain of lumbar region, initial encounter   [place], [place]   7 months ago Tremor of both hands   [facility] [place], [place]   8 months ago [place], [place], [place]   10 months ago Primary insomnia   [place], [place]   12 months ago Diabetes mellitus without complication [facility])   [place], Cle[place], [place]       Future Appointments             In 2 weeks Chrismon, Vickki Muff, PA-C [employer], [facility]

## 2021-03-18 NOTE — Telephone Encounter (Signed)
  Notes to clinic:  Patient has appt on 04/01/2021 Review for refills Overdue for follow up    Requested Prescriptions  Pending Prescriptions Disp Refills   DULoxetine (CYMBALTA) 60 MG capsule [Pharmacy Med Name: DULOXETINE HCL 60 MG CAP] 60 capsule 0    Sig: TAKE 1 CAPSULE BY MOUTH TWICE DAILY     Psychiatry: Antidepressants - SNRI Failed - 03/18/2021 12:35 PM      Failed - Valid encounter within last 6 months    Recent Outpatient Visits           6 months ago Strain of lumbar region, initial encounter   [place], [place]   7 months ago Tremor of both hands   [facility] Mar [place], [place]   8 months ago [place], [place], [place]   10 months ago Primary insomnia   [facility] Fenton Malling M, [place]   12 months ago Diabetes mellitus without complication [facility])   [place], [place]       Future Appointments             In 2 weeks Chrismon, Vickki Muff, PA-C [employer], [place] - Completed PHQ-2 or PHQ-9 in the last 360 days      Passed - Last BP in normal range    BP Readings from Last 1 Encounters:  09/15/20 117/71

## 2021-03-23 ENCOUNTER — Other Ambulatory Visit: Payer: Self-pay | Admitting: Family Medicine

## 2021-03-23 DIAGNOSIS — I10 Essential (primary) hypertension: Secondary | ICD-10-CM

## 2021-03-23 DIAGNOSIS — G250 Essential tremor: Secondary | ICD-10-CM

## 2021-03-23 NOTE — Telephone Encounter (Signed)
   Notes to clinic:  Patient has appt on 04/01/2021 Review for 90 day supply    Requested Prescriptions  Pending Prescriptions Disp Refills   metoprolol succinate (TOPROL-XL) 50 MG 24 hr tablet [Pharmacy Med Name: METOPROLOL SUCCINATE ER 50 MG TAB] 90 tablet 1    Sig: TAKE 1 TABLET BY MOUTH ONCE DAILY. TAKE WITH OR IMMEDIATELY FOLLOWING A MEAL     Cardiovascular:  Beta Blockers Failed - 03/23/2021 10:14 AM      Failed - Valid encounter within last 6 months    Recent Outpatient Visits           6 months ago Strain of lumbar region, initial encounter   [place], [place]   7 months ago Tremor of both hands   [facility] [place], [place]   9 months ago [place], [place], [place]   10 months ago Primary insomnia   [place], [place], [place]   1 year ago Diabetes mellitus without complication [facility])   [place], Clearnce [place], [place]       Future Appointments             In 1 week Chrismon, Vickki Muff, PA-C [employer], [facility]            Passed - Last BP in normal range    BP Readings from Last 1 Encounters:  09/15/20 117/71          Passed - Last Heart Rate in normal range    Pulse Readings from Last 1 Encounters:  09/15/20 73

## 2021-03-26 ENCOUNTER — Other Ambulatory Visit: Payer: Self-pay | Admitting: Family Medicine

## 2021-03-26 DIAGNOSIS — E039 Hypothyroidism, unspecified: Secondary | ICD-10-CM

## 2021-04-01 ENCOUNTER — Ambulatory Visit: Payer: Medicare PPO | Admitting: Family Medicine

## 2021-04-01 ENCOUNTER — Other Ambulatory Visit: Payer: Self-pay

## 2021-04-01 ENCOUNTER — Encounter: Payer: Self-pay | Admitting: Family Medicine

## 2021-04-01 VITALS — BP 142/66 | HR 66 | Temp 98.4°F | Ht 71.0 in | Wt 228.4 lb

## 2021-04-01 DIAGNOSIS — E039 Hypothyroidism, unspecified: Secondary | ICD-10-CM

## 2021-04-01 DIAGNOSIS — E559 Vitamin D deficiency, unspecified: Secondary | ICD-10-CM

## 2021-04-01 DIAGNOSIS — E119 Type 2 diabetes mellitus without complications: Secondary | ICD-10-CM

## 2021-04-01 DIAGNOSIS — G250 Essential tremor: Secondary | ICD-10-CM

## 2021-04-01 DIAGNOSIS — I10 Essential (primary) hypertension: Secondary | ICD-10-CM

## 2021-04-01 DIAGNOSIS — G2581 Restless legs syndrome: Secondary | ICD-10-CM

## 2021-04-01 NOTE — Progress Notes (Signed)
Established patient visit   Patient: Linda Bradford   DOB: 05-25-1956   65 y.o. Female  MRN: [id number] Visit Date: 04/01/2021  Today's healthcare provider: Vernie Murders, PA-C   No chief complaint on file.  Subjective  -------------------------------------------------------------------------------------------------------------------- HPI  Diabetes Mellitus Type II, Follow-up  Lab Results  Component Value Date   HGBA1C 7.0 (A) 03/19/2020   HGBA1C 6.5 (A) 08/17/2018   HGBA1C 7.2 (A) 06/13/2018   Wt Readings from Last 3 Encounters:  09/15/20 211 lb (95.7 kg)  07/29/20 213 lb (96.6 kg)  06/24/20 220 lb 11.2 oz (100.1 kg)   Last seen for diabetes 1 years ago.  Management since then includes continue current medication. She reports good compliance with treatment. She is not having side effects. none Symptoms: No fatigue No foot ulcerations  No appetite changes Yes nausea  No paresthesia of the feet  No polydipsia  No polyuria No visual disturbances   No vomiting     Home blood sugar records:  none  Episodes of hypoglycemia? Yes nausea   Current insulin regiment: no Most Recent Eye Exam: no Current exercise: housecleaning Current diet habits: in general, an "unhealthy" diet sometimes eating just 1x a day  Pertinent Labs: Lab Results  Component Value Date   CHOL 265 (H) 05/19/2017   HDL 62 05/19/2017   LDLCALC 167 (H) 05/19/2017   TRIG 204 (H) 05/19/2017   CHOLHDL 4.3 05/19/2017   Lab Results  Component Value Date   NA 138 07/06/2019   K 3.8 07/06/2019   CREATININE 0.53 07/06/2019   GFRNONAA >60 07/06/2019   GFRAA >60 07/06/2019   GLUCOSE 143 (H) 07/06/2019     ---------------------------------------------------------------------------------------------------   Patient Active Problem List   Diagnosis Date Noted   Chronic obstructive pulmonary disease with acute exacerbation ([place]) 12/31/2019   Cough 12/31/2019   Suspected COVID-19 virus infection  12/31/2019   Antibiotic-induced yeast infection- history of  12/31/2019   Biliary dyskinesia    Diverticulitis of large intestine with abscess 05/27/2018   Diabetes mellitus without complication ([place]) 123456   Lateral epicondylitis 07/07/2017   Personal history of tobacco use, presenting hazards to health 05/03/2016   Bronchitis, chronic ([place]) 03/03/2015   Depression, major, recurrent, moderate ([place]) 11/05/2014   Anxiety, generalized 11/05/2014   Alcohol abuse, in remission 11/05/2014   Nicotine addiction 11/05/2014   H/O: osteoarthritis 11/05/2014   Fibromyalgia 11/05/2014   Restless leg syndrome 11/05/2014   Arthralgia of hip 01/13/2014   Adiposity 01/13/2014   Disordered sleep 01/13/2014   Airway hyperreactivity 12/17/2013   Adult hypothyroidism 12/17/2013   Compulsive tobacco user syndrome 12/17/2013   Current tobacco use 12/17/2013   Basal cell carcinoma 11/04/2013   Arthropathia 02/08/2012   Arthritis of knee, left 02/08/2012   Past Medical History:  Diagnosis Date   Anxiety    Arthritis    Cancer ([place])    skin cancer, removed   COPD (chronic obstructive pulmonary disease) ([place])    Depression    Diabetes mellitus without complication ([place])    Dysrhythmia    occasional pvc's. not being treated.   Fatigue    GERD (gastroesophageal reflux disease)    Headache    Hypertension    Hypothyroidism    Thyroid disease    Past Surgical History:  Procedure Laterality Date   APPENDECTOMY     CHOLECYSTECTOMY N/A 07/01/2019   Procedure: LAPAROSCOPIC CHOLECYSTECTOMY;  Surgeon: Olean Ree, MD;  Location: [facility];  Service: General;  Laterality: N/A;  COLECTOMY WITH COLOSTOMY CREATION/HARTMANN PROCEDURE N/A 09/03/2018   Error, this was not done.   COLONOSCOPY WITH PROPOFOL N/A 03/08/2019   Procedure: COLONOSCOPY WITH PROPOFOL;  Surgeon: Virgel Manifold, MD;  Location: [facility];  Service: Endoscopy;  Laterality: N/A;   CYSTOSCOPY WITH STENT PLACEMENT Bilateral  09/03/2018   Procedure: CYSTOSCOPY WITH STENT PLACEMENT-LIGHTED STENTS;  Surgeon: Hollice Espy, MD;  Location: [facility];  Service: Urology;  Laterality: Bilateral;   LAPAROSCOPIC SIGMOID COLECTOMY N/A 09/03/2018   Procedure: LAPAROSCOPIC SIGMOID COLECTOMY;  Surgeon: Olean Ree, MD;  Location: [facility];  Service: General;  Laterality: N/A;   percutaneous drainage tube  07/2018   2nd tube placed. not healing in colon, causing a fistula   TONSILLECTOMY     TUBAL LIGATION     Allergies  Allergen Reactions   Dexilant [Dexlansoprazole] Nausea Only and Other (See Comments)    Dizziness   Medications: Outpatient Medications Prior to Visit  Medication Sig   acetaminophen (TYLENOL) 500 MG tablet Take 1,000 mg by mouth every 6 (six) hours as needed for mild pain or moderate pain.    albuterol (VENTOLIN HFA) 108 (90 Base) MCG/ACT inhaler INHALE 2 PUFFS INTO THE LUNGS EVERY 4 HOURS AS NEEDED FOR WHEEZING OR SHORTNESS OF BREATH   benzonatate (TESSALON) 200 MG capsule TAKE 1 CAPSULE BY MOUTH 3 TIMES DAILY ASNEEDED   bisacodyl (DULCOLAX) 5 MG EC tablet Take as directed   BREO ELLIPTA 200-25 MCG/INH AEPB INHALE 1 PUFF BY MOUTH ONCE DAILY   buPROPion (WELLBUTRIN XL) 150 MG 24 hr tablet TAKE 1 TABLET BY MOUTH ONCE DAILY   clonazePAM (KLONOPIN) 0.5 MG tablet TAKE 1/2 TABLET BY MOUTH TWICE DAILY AS NEEDED   diclofenac sodium (VOLTAREN) 1 % GEL Apply 4 g topically 4 (four) times daily as needed. (Patient taking differently: Apply 4 g topically 4 (four) times daily as needed. Apply to knees)   DULoxetine (CYMBALTA) 60 MG capsule TAKE 1 CAPSULE BY MOUTH TWICE DAILY   ergocalciferol (VITAMIN D2) 1.25 MG (50000 UT) capsule Take 1 capsule (50,000 Units total) by mouth once a week. Once a week on Wednesday   ezetimibe (ZETIA) 10 MG tablet TAKE 1 TABLET DAILY   fluconazole (DIFLUCAN) 150 MG tablet Take one pill on day 1. Take second pill 3 days later if still symptomatic.   fluticasone (FLONASE) 50 MCG/ACT nasal  spray USE 2 SPRAYS IN EACH NOSTRIL ONCE DAILY   gabapentin (NEURONTIN) 300 MG capsule TAKE 3 CAPSULES BY MOUTH ONCE EVERY MORNING AND 5 CAPSULES AT BEDTIME   glucose blood (CONTOUR NEXT TEST) test strip To check blood sugar once daily   ibuprofen (ADVIL) 600 MG tablet Take 1 tablet (600 mg total) by mouth every 8 (eight) hours as needed for mild pain or moderate pain.   ipratropium-albuterol (DUONEB) 0.5-2.5 (3) MG/3ML SOLN Take 3 mLs by nebulization every 6 (six) hours as needed.   levothyroxine (SYNTHROID) 75 MCG tablet TAKE 1 TABLET BY MOUTH ONCE DAILY ON AN EMPTY STOMACH. WAIT 30 MINUTES BEFORE TAKING OTHER MEDS.   meclizine (ANTIVERT) 25 MG tablet Take 1 tablet (25 mg total) by mouth 3 (three) times daily as needed for dizziness.   meloxicam (MOBIC) 15 MG tablet Take 1 tablet (15 mg total) by mouth daily.   metaxalone (SKELAXIN) 800 MG tablet TAKE 1 TABLET BY MOUTH 3 TIMES DAILY FORMUSCLE SPASMS   metFORMIN (GLUCOPHAGE-XR) 500 MG 24 hr tablet TAKE 2 TABLETS BY MOUTH ONCE DAILY WITH SUPPER   metoprolol succinate (TOPROL-XL) 50  MG 24 hr tablet TAKE 1 TABLET BY MOUTH ONCE DAILY. TAKE WITH OR IMMEDIATELY FOLLOWING A MEAL   nicotine (NICODERM CQ - DOSED IN MG/24 HOURS) 21 mg/24hr patch Place 1 patch (21 mg total) onto the skin daily.   nitrofurantoin, macrocrystal-monohydrate, (MACROBID) 100 MG capsule TAKE 1 CAPSULE BY MOUTH AT BEDTIME   nystatin (MYCOSTATIN) 100000 UNIT/ML suspension TAKE 1 TEASPOON BY MOUTH 4 TIMES DAILY   ondansetron (ZOFRAN) 4 MG tablet TAKE 1 TABLET BY MOUTH EVERY 8 HOURS AS NEEDED   oxyCODONE-acetaminophen (PERCOCET/ROXICET) 5-325 MG tablet Take 1 tablet by mouth every 4 (four) hours as needed for severe pain.   polyethylene glycol (MIRALAX / GLYCOLAX) packet Take 17 g by mouth daily.   polyethylene glycol powder (MIRALAX) 17 GM/SCOOP powder Mix full container in 64 ounces of Gatorade or other clear liquid. NO RED   pyridOXINE (VITAMIN B-6) 100 MG tablet Take 100 mg by mouth  daily.   THEO-24 300 MG 24 hr capsule TAKE 1 CAPSULE BY MOUTH ONCE DAILY   traZODone (DESYREL) 100 MG tablet TAKE 1 TABLET BY MOUTH AT BEDTIME   traZODone (DESYREL) 50 MG tablet TAKE 1/2 TO 2 TABLETS BY MOUTH AT BEDTIME AS NEEDED SLEEP   varenicline (CHANTIX) 0.5 MG tablet Take one 0.5 mg tablet by mouth once daily for 3 days, then increase to one 0.5 mg tablet twice daily for 4 days, then increase to one 1 mg tablet twice daily.   vitamin B-12 (CYANOCOBALAMIN) 1000 MCG tablet Take 1,000 mcg by mouth daily.   No facility-administered medications prior to visit.    Review of Systems  Constitutional: Negative.   HENT: Negative.    Respiratory: Negative.    Cardiovascular: Negative.   Genitourinary: Negative.    Last CBC Lab Results  Component Value Date   WBC 7.0 07/06/2019   HGB 14.4 07/06/2019   HCT 43.5 07/06/2019   MCV 94.4 07/06/2019   MCH 31.2 07/06/2019   RDW 12.5 07/06/2019   PLT 257 123456   Last metabolic panel Lab Results  Component Value Date   GLUCOSE 143 (H) 07/06/2019   NA 138 07/06/2019   K 3.8 07/06/2019   CL 100 07/06/2019   CO2 28 07/06/2019   BUN 14 07/06/2019   CREATININE 0.53 07/06/2019   GFRNONAA >60 07/06/2019   GFRAA >60 07/06/2019   CALCIUM 9.2 07/06/2019   PROT 7.5 07/06/2019   ALBUMIN 3.9 07/06/2019   BILITOT 0.5 07/06/2019   ALKPHOS 95 07/06/2019   AST 20 07/06/2019   ALT 20 07/06/2019   ANIONGAP 10 07/06/2019   Last lipids Lab Results  Component Value Date   CHOL 265 (H) 05/19/2017   HDL 62 05/19/2017   LDLCALC 167 (H) 05/19/2017   TRIG 204 (H) 05/19/2017   CHOLHDL 4.3 05/19/2017   Last hemoglobin A1c Lab Results  Component Value Date   HGBA1C 7.0 (A) 03/19/2020   Last thyroid functions Lab Results  Component Value Date   TSH 2.16 05/19/2017   Last vitamin D Lab Results  Component Value Date   VD25OH 18 (L) 05/19/2017   Last vitamin B12 and Folate Lab Results  Component Value Date   VITAMINB12 444 04/25/2016        Objective  -------------------------------------------------------------------------------------------------------------------- BP (!) 142/66 (BP Location: Right Arm, Patient Position: Sitting, Cuff Size: Large)   Pulse 66   Temp 98.4 F (36.9 C) (Oral)   Ht '5\' 11"'$  (1.803 m)   Wt 228 lb 6.4 oz (103.6 kg)  SpO2 100%   BMI 31.86 kg/m  BP Readings from Last 3 Encounters:  09/15/20 117/71  07/29/20 115/70  06/24/20 (!) 128/58   Wt Readings from Last 3 Encounters:  09/15/20 211 lb (95.7 kg)  07/29/20 213 lb (96.6 kg)  06/24/20 220 lb 11.2 oz (100.1 kg)   Physical Exam Constitutional:      General: She is not in acute distress.    Appearance: She is well-developed.  HENT:     Head: Normocephalic and atraumatic.     Right Ear: Hearing and tympanic membrane normal.     Left Ear: Hearing and tympanic membrane normal.     Nose: Nose normal.     Mouth/Throat:     Pharynx: Oropharynx is clear.  Eyes:     General: Lids are normal. No scleral icterus.       Right eye: No discharge.        Left eye: No discharge.     Conjunctiva/sclera: Conjunctivae normal.  Cardiovascular:     Rate and Rhythm: Normal rate and regular rhythm.     Pulses: Normal pulses.     Heart sounds: Normal heart sounds.  Pulmonary:     Effort: Pulmonary effort is normal. No respiratory distress.     Breath sounds: Normal breath sounds.  Abdominal:     General: Bowel sounds are normal.     Palpations: Abdomen is soft.  Musculoskeletal:        General: Normal range of motion.     Cervical back: Normal range of motion and neck supple.     Comments: Crepitus in knees   Skin:    Findings: No lesion or rash.  Neurological:     Mental Status: She is alert and oriented to person, place, and time.  Psychiatric:        Speech: Speech normal.        Behavior: Behavior normal.        Thought Content: Thought content normal.     Diabetic Foot Form - Detailed   Diabetic Foot Exam - detailed Diabetic  Foot exam was performed with the following findings: Yes 04/01/2021  2:07 PM  Visual Foot Exam completed.: Yes  Can the patient see the bottom of their feet?: Yes Are the shoes appropriate in style and fit?: Yes Is there swelling or and abnormal foot shape?: No Is there a claw toe deformity?: No Is there elevated skin temparature?: No Is there foot or ankle muscle weakness?: No Normal Range of Motion: Yes Pulse Foot Exam completed.: Yes   Right posterior Tibialias: Present Left posterior Tibialias: Present   Right Dorsalis Pedis: Present Left Dorsalis Pedis: Present  Sensory Foot Exam Completed.: Yes Semmes-Weinstein Monofilament Test R Site 1-Great Toe: Pos L Site 1-Great Toe: Pos          No results found for any visits on 04/01/21.  Assessment & Plan  ---------------------------------------------------------------------------------------------------------------------- 1. Diabetes mellitus without complication (HCC) Trying to follow diabetic diet and taking Metformin 1000 mg qd. Hgb A1C was 7.0% in 2021. Feeling well without hypoglycemic episodes. Recommend she get annual eye exam with ophthalmologist. Recheck labs. - CBC with Differential/Platelet - Comprehensive metabolic panel - Lipid panel - Hemoglobin A1c - Urine Microalbumin w/creat. ratio  2. Adult hypothyroidism Taking Levothyroxine 75 mcg qd and has lost 9 lbs in the past year. Recheck follow up labs. - Comprehensive metabolic panel - TSH - T4  3. Essential hypertension Tolerating Metoprolol Succinate 50 mg qd. Systolic BP above [id number] today.  Reminded to restrict salt, caffeine, energy drinks and decongestant use. Recheck labs. - CBC with Differential/Platelet - Comprehensive metabolic panel - Lipid panel - TSH  4. Vitamin D deficiency Was treated with with 50,000 IU q week in 2020. Will recheck level. - CBC with Differential/Platelet - VITAMIN D 25 Hydroxy (Vit-D Deficiency, Fractures)  5. Essential  tremor Followed by Dr. Manuella Ghazi (neurologist). Feels it is primarily in the left hand and not a hindering ADL's. Recheck labs - CBC with Differential/Platelet - Comprehensive metabolic panel - Lipid panel  6. Restless leg syndrome Sleeping well with use of Gabapentin. Followed by neurologist. Recheck labs. - CBC with Differential/Platelet - Comprehensive metabolic panel - Lipid panel   No follow-ups on file.      I, Ahnesty Finfrock, PA-C, have reviewed all documentation for this visit. The documentation on 04/07/21 for the exam, diagnosis, procedures, and orders are all accurate and complete.    Vernie Murders, PA-C  [employer] [phone] (phone) [phone] (fax)  [place]

## 2021-04-07 LAB — CBC WITH DIFFERENTIAL/PLATELET
Basophils Absolute: 0.1 10*3/uL (ref 0.0–0.2)
Basos: 1 %
EOS (ABSOLUTE): 0.2 10*3/uL (ref 0.0–0.4)
Eos: 3 %
Hematocrit: 40.7 % (ref 34.0–46.6)
Hemoglobin: 13.7 g/dL (ref 11.1–15.9)
Immature Grans (Abs): 0 10*3/uL (ref 0.0–0.1)
Immature Granulocytes: 0 %
Lymphocytes Absolute: 1.9 10*3/uL (ref 0.7–3.1)
Lymphs: 31 %
MCH: 31.8 pg (ref 26.6–33.0)
MCHC: 33.7 g/dL (ref 31.5–35.7)
MCV: 94 fL (ref 79–97)
Monocytes Absolute: 0.5 10*3/uL (ref 0.1–0.9)
Monocytes: 9 %
Neutrophils Absolute: 3.3 10*3/uL (ref 1.4–7.0)
Neutrophils: 56 %
Platelets: 264 10*3/uL (ref 150–450)
RBC: 4.31 x10E6/uL (ref 3.77–5.28)
RDW: 13.4 % (ref 11.7–15.4)
WBC: 6 10*3/uL (ref 3.4–10.8)

## 2021-04-07 LAB — LIPID PANEL
Chol/HDL Ratio: 4.1 ratio (ref 0.0–4.4)
Cholesterol, Total: 230 mg/dL — ABNORMAL HIGH (ref 100–199)
HDL: 56 mg/dL (ref >39)
LDL Chol Calc (NIH): 146 mg/dL — ABNORMAL HIGH (ref 0–99)
Triglycerides: 157 mg/dL — ABNORMAL HIGH (ref 0–149)
VLDL Cholesterol Cal: 28 mg/dL (ref 5–40)

## 2021-04-07 LAB — COMPREHENSIVE METABOLIC PANEL
ALT: 17 IU/L (ref 0–32)
AST: 19 IU/L (ref 0–40)
Albumin/Globulin Ratio: 2 (ref 1.2–2.2)
Albumin: 4.2 g/dL (ref 3.8–4.8)
Alkaline Phosphatase: 96 IU/L (ref 44–121)
BUN/Creatinine Ratio: 18 (ref 12–28)
BUN: 12 mg/dL (ref 8–27)
Bilirubin Total: 0.2 mg/dL (ref 0.0–1.2)
CO2: 24 mmol/L (ref 20–29)
Calcium: 9.6 mg/dL (ref 8.7–10.3)
Chloride: 100 mmol/L (ref 96–106)
Creatinine, Ser: 0.67 mg/dL (ref 0.57–1.00)
Globulin, Total: 2.1 g/dL (ref 1.5–4.5)
Glucose: 116 mg/dL — ABNORMAL HIGH (ref 65–99)
Potassium: 4.6 mmol/L (ref 3.5–5.2)
Sodium: 139 mmol/L (ref 134–144)
Total Protein: 6.3 g/dL (ref 6.0–8.5)
eGFR: 97 mL/min/{1.73_m2} (ref >59)

## 2021-04-07 LAB — VITAMIN D 25 HYDROXY (VIT D DEFICIENCY, FRACTURES): Vit D, 25-Hydroxy: 25.4 ng/mL — ABNORMAL LOW (ref 30.0–100.0)

## 2021-04-07 LAB — MICROALBUMIN / CREATININE URINE RATIO
Creatinine, Urine: 118.2 mg/dL
Microalb/Creat Ratio: 30 mg/g creat — ABNORMAL HIGH (ref 0–29)
Microalbumin, Urine: 35.5 ug/mL

## 2021-04-07 LAB — TSH: TSH: 4.79 u[IU]/mL — ABNORMAL HIGH (ref 0.450–4.500)

## 2021-04-07 LAB — T4: T4, Total: 10.3 ug/dL (ref 4.5–12.0)

## 2021-04-07 LAB — HEMOGLOBIN A1C
Est. average glucose Bld gHb Est-mCnc: 137 mg/dL
Hgb A1c MFr Bld: 6.4 % — ABNORMAL HIGH (ref 4.8–5.6)

## 2021-04-21 ENCOUNTER — Other Ambulatory Visit: Payer: Self-pay | Admitting: Orthopedic Surgery

## 2021-04-21 ENCOUNTER — Encounter: Payer: Self-pay | Admitting: Orthopedic Surgery

## 2021-04-21 ENCOUNTER — Other Ambulatory Visit: Payer: Self-pay | Admitting: Physician Assistant

## 2021-04-21 ENCOUNTER — Other Ambulatory Visit: Payer: Self-pay | Admitting: Family Medicine

## 2021-04-21 DIAGNOSIS — F419 Anxiety disorder, unspecified: Secondary | ICD-10-CM

## 2021-04-21 DIAGNOSIS — F32A Depression, unspecified: Secondary | ICD-10-CM

## 2021-04-21 NOTE — Telephone Encounter (Signed)
LOV:  04/01/2021 NOV: 08/10/2021   Last Refill:  11/18/2020  #180  0 Refill   Thanks,   Mickel Baas

## 2021-04-21 NOTE — H&P (Signed)
Linda Bradford MRN:  [id number] DOB/SEX:  [birth year]/female  CHIEF COMPLAINT:  Painful left Knee  HISTORY: Patient is a 65 y.o. female presented with a history of pain in the left knee. Onset of symptoms was gradual starting several years ago with gradually worsening course since that time. Prior procedures on the knee include none. Patient has been treated conservatively with over-the-counter NSAIDs and activity modification. Patient currently rates pain in the knee at 10 out of 10 with activity. There is pain at night.  PAST MEDICAL HISTORY: Patient Active Problem List   Diagnosis Date Noted   Chronic obstructive pulmonary disease with acute exacerbation ([place]) 12/31/2019   Cough 12/31/2019   Suspected COVID-19 virus infection 12/31/2019   Antibiotic-induced yeast infection- history of  12/31/2019   Biliary dyskinesia    Diverticulitis of large intestine with abscess 05/27/2018   Diabetes mellitus without complication ([place]) 40/98/1191   Lateral epicondylitis 07/07/2017   Personal history of tobacco use, presenting hazards to health 05/03/2016   Bronchitis, chronic ([place]) 03/03/2015   Depression, major, recurrent, moderate ([place]) 11/05/2014   Anxiety, generalized 11/05/2014   Alcohol abuse, in remission 11/05/2014   Nicotine addiction 11/05/2014   H/O: osteoarthritis 11/05/2014   Fibromyalgia 11/05/2014   Restless leg syndrome 11/05/2014   Arthralgia of hip 01/13/2014   Adiposity 01/13/2014   Disordered sleep 01/13/2014   Airway hyperreactivity 12/17/2013   Adult hypothyroidism 12/17/2013   Compulsive tobacco user syndrome 12/17/2013   Current tobacco use 12/17/2013   Basal cell carcinoma 11/04/2013   Arthropathia 02/08/2012   Arthritis of knee, left 02/08/2012   Past Medical History:  Diagnosis Date   Anxiety    Arthritis    Cancer ([place])    skin cancer, removed   COPD (chronic obstructive pulmonary disease) ([place])    Depression    Diabetes mellitus without complication ([place])     Dysrhythmia    occasional pvc's. not being treated.   Fatigue    GERD (gastroesophageal reflux disease)    Headache    Hypertension    Hypothyroidism    Thyroid disease    Tremors of nervous system    Past Surgical History:  Procedure Laterality Date   APPENDECTOMY     CHOLECYSTECTOMY N/A 07/01/2019   Procedure: LAPAROSCOPIC CHOLECYSTECTOMY;  Surgeon: Olean Ree, MD;  Location: [facility];  Service: General;  Laterality: N/A;   COLECTOMY WITH COLOSTOMY CREATION/HARTMANN PROCEDURE N/A 09/03/2018   Error, this was not done.   COLONOSCOPY WITH PROPOFOL N/A 03/08/2019   Procedure: COLONOSCOPY WITH PROPOFOL;  Surgeon: Virgel Manifold, MD;  Location: [facility];  Service: Endoscopy;  Laterality: N/A;   CYSTOSCOPY WITH STENT PLACEMENT Bilateral 09/03/2018   Procedure: CYSTOSCOPY WITH STENT PLACEMENT-LIGHTED STENTS;  Surgeon: Hollice Espy, MD;  Location: [facility];  Service: Urology;  Laterality: Bilateral;   LAPAROSCOPIC SIGMOID COLECTOMY N/A 09/03/2018   Procedure: LAPAROSCOPIC SIGMOID COLECTOMY;  Surgeon: Olean Ree, MD;  Location: [facility];  Service: General;  Laterality: N/A;   percutaneous drainage tube  07/2018   2nd tube placed. not healing in colon, causing a fistula   TONSILLECTOMY     TUBAL LIGATION       MEDICATIONS:  (Not in a hospital admission)   ALLERGIES:   Allergies  Allergen Reactions   Dexilant [Dexlansoprazole] Nausea Only and Other (See Comments)    Dizziness    REVIEW OF SYSTEMS:  Pertinent items are noted in HPI.   FAMILY HISTORY:   Family History  Problem Relation Age of Onset  Lung cancer Mother    Alcohol abuse Sister    Arthritis Sister    Alcohol abuse Brother    Heart disease Brother 38   Heart attack Brother    Pancreatic cancer Maternal Aunt    Diabetes Maternal Aunt    Alcohol abuse Son    Drug abuse Son    Drug abuse Son    Heart disease Father 2   Diabetes Sister    Breast cancer Brother    Alcohol abuse Maternal Uncle     Alcohol abuse Maternal Grandmother    Diabetes Maternal Grandmother     SOCIAL HISTORY:   Social History   Tobacco Use   Smoking status: Every Day    Packs/day: 1.50    Years: 45.00    Pack years: 67.50    Types: Cigarettes    Start date: 03/02/1980   Smokeless tobacco: Never  Substance Use Topics   Alcohol use: No    Alcohol/week: 0.0 standard drinks     EXAMINATION:  Vital signs in last 24 hours: @VSRANGES @  General appearance: alert, cooperative, and no distress Neck: no JVD and supple, symmetrical, trachea midline Lungs: clear to auscultation bilaterally Heart: regular rate and rhythm, S1, S2 normal, no murmur, click, rub or gallop Abdomen: soft, non-tender; bowel sounds normal; no masses,  no organomegaly Extremities: extremities normal, atraumatic, no cyanosis or edema and Homans sign is negative, no sign of DVT Pulses: 2+ and symmetric Skin: Skin color, texture, turgor normal. No rashes or lesions Neurologic: Alert and oriented X 3, normal strength and tone. Normal symmetric reflexes. Normal coordination and gait  Musculoskeletal:  ROM 0-100, Ligaments intact,  Imaging Review Plain radiographs demonstrate severe degenerative joint disease of the left knee. The overall alignment is significant varus. The bone quality appears to be good for age and reported activity level.  Assessment/Plan: Primary osteoarthritis, left knee   The patient history, physical examination and imaging studies are consistent with advanced degenerative joint disease of the left knee. The patient has failed conservative treatment.  The clearance notes were reviewed.  After discussion with the patient it was felt that Total Knee Replacement was indicated. The procedure,  risks, and benefits of total knee arthroplasty were presented and reviewed. The risks including but not limited to aseptic loosening, infection, blood clots, vascular injury, stiffness, patella tracking problems complications  among others were discussed. The patient acknowledged the explanation, agreed to proceed with the plan.  Carlynn Spry 04/21/2021, 3:54 PM

## 2021-04-21 NOTE — Telephone Encounter (Signed)
Requested medication (s) are due for refill today: Yes  Requested medication (s) are on the active medication list: Yes  Last refill:  11/18/20  Future visit scheduled: Yes  Notes to clinic:  Unable to refill per protocol, cannot delegate.      Requested Prescriptions  Pending Prescriptions Disp Refills   clonazePAM (KLONOPIN) 0.5 MG tablet [Pharmacy Med Name: CLONAZEPAM 0.5 MG TAB] 180 tablet     Sig: TAKE 1/2 TABLET BY MOUTH TWICE DAILY AS NEEDED     Not Delegated - Psychiatry:  Anxiolytics/Hypnotics Failed - 04/21/2021 12:10 PM      Failed - This refill cannot be delegated      Failed - Urine Drug Screen completed in last 360 days      Passed - Valid encounter within last 6 months    Recent Outpatient Visits           2 weeks ago Diabetes mellitus without complication [facility])   [place], Vickki Muff, PA-C   7 months ago Strain of lumbar region, initial encounter   [employer], [place], PA-C   8 months ago Tremor of both hands   [facility] Fenton Malling M, [place]   10 months ago Martin Carles Collet M, [place]   11 months ago Primary insomnia   [employer], Clearnce Sorrel, [place]       Future Appointments             In 3 months [name], [place], [facility]            Signed Prescriptions Disp Refills   DULoxetine (CYMBALTA) 60 MG capsule 180 capsule 1    Sig: TAKE 1 CAPSULE BY MOUTH TWICE DAILY     Psychiatry: Antidepressants - SNRI Failed - 04/21/2021 12:10 PM      Failed - Last BP in normal range    BP Readings from Last 1 Encounters:  04/01/21 (!) 142/66          Passed - Completed PHQ-2 or PHQ-9 in the last 360 days      Passed - Valid encounter within last 6 months    Recent Outpatient Visits           2 weeks ago Diabetes mellitus without complication [facility])   [place],  PA-C   7 months ago Strain of lumbar region, initial encounter   [place], [place]   8 months ago Tremor of both hands   [facility] Fenton Malling M, [place]   10 months ago [place], [place], [place]   11 months ago Primary insomnia   [facility] [place], Clearnce Sorrel, [place]       Future Appointments             In 3 months Rollene Rotunda, Jaci Standard, [place], [place]

## 2021-04-22 ENCOUNTER — Other Ambulatory Visit: Payer: Self-pay | Admitting: Family Medicine

## 2021-04-22 DIAGNOSIS — E119 Type 2 diabetes mellitus without complications: Secondary | ICD-10-CM

## 2021-04-22 MED ORDER — METFORMIN HCL ER 500 MG PO TB24: ORAL_TABLET | ORAL | 2 refills | Status: DC

## 2021-04-22 NOTE — Telephone Encounter (Signed)
Copied from [place] [id number]. Topic: Quick Communication - Rx Refill/Question >> Apr 22, 2021 12:00 PM Leward Quan A wrote: Medication: metFORMIN (GLUCOPHAGE-XR) 500 MG 24 hr tablet Patient asking for Rx to be sent to the pharmacy she is out as of this morning need Rx sent to pharmacy today. Please call patient when done  Has the patient contacted their pharmacy? Yes.   (Agent: If no, request that the patient contact the pharmacy for the refill.) (Agent: If yes, when and what did the pharmacy advise?)  Preferred Pharmacy (with phone number or street name): [facility] [place], [place].  Phone:  [phone] Fax:  [phone]    Has the patient been seen for an appointment in the last year OR does the patient have an upcoming appointment? Yes.    Agent: Please be advised that RX refills may take up to 3 business days. We ask that you follow-up with your pharmacy.

## 2021-04-29 ENCOUNTER — Other Ambulatory Visit: Payer: Self-pay

## 2021-04-29 ENCOUNTER — Encounter
Admission: RE | Admit: 2021-04-29 | Discharge: 2021-04-29 | Disposition: A | Discharge status: Home / Self Care | Payer: Medicare PPO | Source: Ambulatory Visit | Attending: Orthopedic Surgery | Admitting: Orthopedic Surgery

## 2021-04-29 ENCOUNTER — Other Ambulatory Visit: Payer: Self-pay | Admitting: Family Medicine

## 2021-04-29 DIAGNOSIS — Z01810 Encounter for preprocedural cardiovascular examination: Secondary | ICD-10-CM

## 2021-04-29 DIAGNOSIS — Z01818 Encounter for other preprocedural examination: Secondary | ICD-10-CM | POA: Diagnosis present

## 2021-04-29 DIAGNOSIS — F5101 Primary insomnia: Secondary | ICD-10-CM

## 2021-04-29 LAB — URINALYSIS, ROUTINE W REFLEX MICROSCOPIC
Bilirubin Urine: NEGATIVE
Glucose, UA: NEGATIVE mg/dL
Hgb urine dipstick: NEGATIVE
Ketones, ur: NEGATIVE mg/dL
Nitrite: POSITIVE — AB
Protein, ur: NEGATIVE mg/dL
Specific Gravity, Urine: 1.02 (ref 1.005–1.030)
pH: 6 (ref 5.0–8.0)

## 2021-04-29 LAB — BASIC METABOLIC PANEL
Anion gap: 8 (ref 5–15)
BUN: 9 mg/dL (ref 8–23)
CO2: 28 mmol/L (ref 22–32)
Calcium: 9 mg/dL (ref 8.9–10.3)
Chloride: 102 mmol/L (ref 98–111)
Creatinine, Ser: 0.6 mg/dL (ref 0.44–1.00)
GFR, Estimated: >60 mL/min (ref >60)
Glucose, Bld: 149 mg/dL — ABNORMAL HIGH (ref 70–99)
Potassium: 4.2 mmol/L (ref 3.5–5.1)
Sodium: 138 mmol/L (ref 135–145)

## 2021-04-29 LAB — CBC
HCT: 44.8 % (ref 36.0–46.0)
Hemoglobin: 14.7 g/dL (ref 12.0–15.0)
MCH: 32.2 pg (ref 26.0–34.0)
MCHC: 32.8 g/dL (ref 30.0–36.0)
MCV: 98 fL (ref 80.0–100.0)
Platelets: 258 10*3/uL (ref 150–400)
RBC: 4.57 MIL/uL (ref 3.87–5.11)
RDW: 13.2 % (ref 11.5–15.5)
WBC: 4.9 10*3/uL (ref 4.0–10.5)
nRBC: 0 % (ref 0.0–0.2)

## 2021-04-29 LAB — PROTIME-INR
INR: 0.9 (ref 0.8–1.2)
Prothrombin Time: 12.5 seconds (ref 11.4–15.2)

## 2021-04-29 LAB — SURGICAL PCR SCREEN
MRSA, PCR: NEGATIVE
Staphylococcus aureus: NEGATIVE

## 2021-04-29 LAB — APTT: aPTT: 29 seconds (ref 24–36)

## 2021-04-29 MED ORDER — TRAZODONE HCL 100 MG PO TABS: 200.0000 mg | ORAL_TABLET | Freq: Every evening | ORAL | 3 refills | Status: DC | PRN

## 2021-04-29 NOTE — Patient Instructions (Addendum)
Your procedure is scheduled on: 05/10/2021 Report to the Registration Desk on the 1st floor of the [facility]. To find out your arrival time, please call [phone] between 1PM - 3PM on: May 07, 2021   REMEMBER: Instructions that are not followed completely may result in serious medical risk, up to and including death; or upon the discretion of your surgeon and anesthesiologist your surgery may need to be rescheduled.  Do not eat food after midnight the night before surgery.  No gum chewing, lozengers or hard candies.  You may however, drink water up to 2 hours before you are scheduled to arrive for your surgery. Do not drink anything within 2 hours of your scheduled arrival time.   Type 1 and Type 2 diabetics should only drink water.   TAKE THESE MEDICATIONS THE MORNING OF SURGERY WITH A SIP OF WATER: Wellbutrin Clonopin Cymbalta Zetia levothyroxine  Primidone Metaxalone Metoprolol tramadol   Use breo inhaler and douneb  at home  on the day of surgery and bring albuterol  to the hospital.  **Follow new guidelines for insulin and diabetes medications.**  Hold metformin for two days prior to surgery. Last dose will be May 07, 2021  Follow recommendations from Cardiologist, Pulmonologist or PCP regarding stopping Aspirin.   One week prior to surgery: Stop Anti-inflammatories (NSAIDS) such as Advil, Aleve, Ibuprofen, Motrin, Naproxen, Naprosyn and Aspirin based products such as Excedrin, Goodys Powder, BC Powder and mobic.  Stop ANY OVER THE COUNTER supplements until after surgery like B12 and Vit D You may however, continue to take Tylenol if needed for pain up until the day of surgery.  No Alcohol for 24 hours before or after surgery.  No Smoking including e-cigarettes for 24 hours prior to surgery.  No chewable tobacco products for at least 6 hours prior to surgery.  No nicotine patches on the day of surgery.  Do not use any "recreational" drugs for at least a  week prior to your surgery.  Please be advised that the combination of cocaine and anesthesia may have negative outcomes, up to and including death. If you test positive for cocaine, your surgery will be cancelled.  On the morning of surgery brush your teeth with toothpaste and water, you may rinse your mouth with mouthwash if you wish. Do not swallow any toothpaste or mouthwash.  Use CHG Soap or wipes as directed on instruction sheet.  Do not wear jewelry, make-up, hairpins, clips or nail polish.  Do not wear lotions, powders, or perfumes.   Do not shave body from the neck down 48 hours prior to surgery just in case you cut yourself which could leave a site for infection.  Also, freshly shaved skin may become irritated if using the CHG soap.  Contact lenses, hearing aids and dentures may not be worn into surgery.  Do not bring valuables to the hospital. [facility] is not responsible for any missing/lost belongings or valuables.    Notify your doctor if there is any change in your medical condition (cold, fever, infection).  Wear comfortable clothing (specific to your surgery type) to the hospital.  After surgery, you can help prevent lung complications by doing breathing exercises.  Take deep breaths and cough every 1-2 hours. Your doctor may order a device called an Incentive Spirometer to help you take deep breaths.  If you are being admitted to the hospital overnight, leave your suitcase in the car. After surgery it may be brought to your room.  If  you are being discharged the day of surgery, you will not be allowed to drive home. You will need a responsible adult (18 years or older) to drive you home and stay with you that night.   If you are taking public transportation, you will need to have a responsible adult (18 years or older) with you. Please confirm with your physician that it is acceptable to use public transportation.   Please call the [place] [facility].  at [phone] if you have any questions about these instructions.  Surgery Visitation Policy:  Patients undergoing a surgery or procedure may have one family member or support person with them as long as that person is not COVID-19 positive or experiencing its symptoms.  That person may remain in the waiting area during the procedure and may rotate out with other people.  Inpatient Visitation:    Visiting hours are 7 a.m. to 8 p.m. Up to two visitors ages 16+ are allowed at one time in a patient room. The visitors may rotate out with other people during the day. Visitors must check out when they leave, or other visitors will not be allowed. One designated support person may remain overnight. The visitor must pass COVID-19 screenings, use hand sanitizer when entering and exiting the patient's room and wear a mask at all times, including in the patient's room. Patients must also wear a mask when staff or their visitor are in the room. Masking is required regardless of vaccination status.

## 2021-05-01 ENCOUNTER — Telehealth: Payer: Self-pay | Admitting: Urgent Care

## 2021-05-01 DIAGNOSIS — Z01818 Encounter for other preprocedural examination: Secondary | ICD-10-CM

## 2021-05-01 DIAGNOSIS — B9620 Unspecified Escherichia coli [E. coli] as the cause of diseases classified elsewhere: Secondary | ICD-10-CM

## 2021-05-01 LAB — URINE CULTURE: Culture: >=100000 — AB

## 2021-05-01 MED ORDER — NITROFURANTOIN MONOHYD MACRO 100 MG PO CAPS: 100.0000 mg | ORAL_CAPSULE | Freq: Two times a day (BID) | ORAL | 0 refills | Status: AC

## 2021-05-01 NOTE — Progress Notes (Signed)
  [facility] Perioperative Services: Pre-Admission/Anesthesia Testing  Abnormal Lab Notification and Treatment Plan of Care   Date: 05/01/21  Name: Linda Bradford MRN:   [id number]  Re: Abnormal labs noted during PAT appointment   Provider(s) Notified: No att. providers found Notification mode: Routed and/or faxed via [place] LAB VALUE(S): Lab Results  Component Value Date   COLORURINE YELLOW (A) 04/29/2021   APPEARANCEUR CLEAR (A) 04/29/2021   LABSPEC 1.020 04/29/2021   PHURINE 6.0 04/29/2021   GLUCOSEU NEGATIVE 04/29/2021   HGBUR NEGATIVE 04/29/2021   BILIRUBINUR NEGATIVE 04/29/2021   KETONESUR NEGATIVE 04/29/2021   PROTEINUR NEGATIVE 04/29/2021   UROBILINOGEN 0.2 12/02/2019   NITRITE POSITIVE (A) 04/29/2021   LEUKOCYTESUR SMALL (A) 04/29/2021   EPIU 0-5 04/19/2019   WBCU 0-5 04/19/2019   RBCU Negative 05/20/2019   BACTERIA Few 05/20/2019   Lab Results  Component Value Date   CULT >=100,000 COLONIES/mL ESCHERICHIA COLI (A) 04/29/2021    Clinical Notes:  Patient scheduled for LEFT TOTAL KNEE ARTHROPLASTY on 05/10/2021.    Pre-procedural UA performed in PAT consistent with infection.  No leukocytosis noted on CBC; WBC 4900 Renal function: Estimated Creatinine Clearance: 92.7 mL/min (by C-G formula based on SCr of 0.6 mg/dL). Urine C&S added to assess for pathogenically significant growth.  Impression and Plan:  UA was (+) for infection; reflex culture sent that grew out pathogenically significant isolate (Escherichia coli). Contacted patient to discuss by both phone and MyChart, however I was unable to directly speak with her regarding any potential symptom constellation. I left a detailed MOM as per her signed DPR directives asking her to reply to the MyChart message regarding symptoms. Will update clinical notes if and when patient responds. Patient has an elective orthopedic surgery scheduled soon. In efforts to avoid delaying patient's  procedure, I would like to proceed with culture appropriate treatment for urinary tract infection.  Allergies review; no allergies to ABX. Will treat with a 5 day course of nitrofurantoin. Patient encouraged to complete the entire course of antibiotics even if she begins to feel better. Antimicrobial coverage is sensitive to the isolated pathogen on her urine culture; no change in therapy anticipated unless patient has issues with tolerance.    Meds ordered this encounter  Medications   nitrofurantoin, macrocrystal-monohydrate, (MACROBID) 100 MG capsule    Sig: Take 1 capsule (100 mg total) by mouth 2 (two) times daily for 5 days. Increase water intake while taking this medication.    Dispense:  10 capsule    Refill:  0   Patient encouraged to increase her fluid intake as much as possible. Discussed that water is always best to flush the urinary tract. She was advised to avoid caffeine containing fluids until her infections clears, as caffeine can cause her to experience painful bladder spasms.   May use Tylenol as needed for pain/fever.   Results and treatment plan of care forwarded to primary attending surgeon to make them aware.   Patient advised to monitor symptoms and contact her PCP, surgeon, or PAT office with any questions or concerns that may arise between now and her scheduled surgery.    This is a Community education officer; no formal response is required.  Honor Loh, MSN, APRN, FNP-C, CEN [facility]  Peri-operative Services Nurse Practitioner Phone: [phone] Fax: [phone] 05/01/21 3:13 PM

## 2021-05-03 DIAGNOSIS — Z0000 Encounter for general adult medical examination without abnormal findings: Secondary | ICD-10-CM | POA: Insufficient documentation

## 2021-05-06 ENCOUNTER — Other Ambulatory Visit
Admission: RE | Admit: 2021-05-06 | Discharge: 2021-05-06 | Disposition: A | Discharge status: Home / Self Care | Payer: Medicare PPO | Source: Ambulatory Visit | Attending: Orthopedic Surgery | Admitting: Orthopedic Surgery

## 2021-05-06 ENCOUNTER — Other Ambulatory Visit: Payer: Self-pay

## 2021-05-06 DIAGNOSIS — Z20822 Contact with and (suspected) exposure to covid-19: Secondary | ICD-10-CM | POA: Diagnosis not present

## 2021-05-06 DIAGNOSIS — Z01812 Encounter for preprocedural laboratory examination: Secondary | ICD-10-CM | POA: Diagnosis present

## 2021-05-06 LAB — SARS CORONAVIRUS 2 (TAT 6-24 HRS): SARS Coronavirus 2: NEGATIVE

## 2021-05-10 ENCOUNTER — Observation Stay: Payer: Medicare PPO

## 2021-05-10 ENCOUNTER — Other Ambulatory Visit: Payer: Self-pay

## 2021-05-10 ENCOUNTER — Observation Stay
Admission: RE | Admit: 2021-05-10 | Discharge: 2021-05-11 | Disposition: A | Discharge status: Home / Self Care | Payer: Medicare PPO | Attending: Orthopedic Surgery | Admitting: Orthopedic Surgery

## 2021-05-10 ENCOUNTER — Ambulatory Visit: Payer: Medicare PPO | Admitting: Urgent Care

## 2021-05-10 ENCOUNTER — Encounter
Admission: RE | Disposition: A | Discharge status: Home / Self Care | Payer: Self-pay | Source: Home / Self Care | Attending: Orthopedic Surgery

## 2021-05-10 ENCOUNTER — Encounter: Payer: Self-pay | Admitting: Orthopedic Surgery

## 2021-05-10 DIAGNOSIS — I10 Essential (primary) hypertension: Secondary | ICD-10-CM | POA: Insufficient documentation

## 2021-05-10 DIAGNOSIS — E039 Hypothyroidism, unspecified: Secondary | ICD-10-CM | POA: Insufficient documentation

## 2021-05-10 DIAGNOSIS — M1712 Unilateral primary osteoarthritis, left knee: Secondary | ICD-10-CM | POA: Diagnosis present

## 2021-05-10 DIAGNOSIS — G8918 Other acute postprocedural pain: Secondary | ICD-10-CM

## 2021-05-10 DIAGNOSIS — E119 Type 2 diabetes mellitus without complications: Secondary | ICD-10-CM | POA: Diagnosis not present

## 2021-05-10 DIAGNOSIS — Z79899 Other long term (current) drug therapy: Secondary | ICD-10-CM | POA: Diagnosis not present

## 2021-05-10 DIAGNOSIS — J449 Chronic obstructive pulmonary disease, unspecified: Secondary | ICD-10-CM | POA: Insufficient documentation

## 2021-05-10 DIAGNOSIS — Z85828 Personal history of other malignant neoplasm of skin: Secondary | ICD-10-CM | POA: Insufficient documentation

## 2021-05-10 DIAGNOSIS — Z23 Encounter for immunization: Secondary | ICD-10-CM | POA: Diagnosis not present

## 2021-05-10 DIAGNOSIS — Z96652 Presence of left artificial knee joint: Secondary | ICD-10-CM

## 2021-05-10 HISTORY — PX: TOTAL KNEE ARTHROPLASTY: SHX125

## 2021-05-10 LAB — TYPE AND SCREEN
ABO/RH(D): O POS
Antibody Screen: NEGATIVE

## 2021-05-10 LAB — GLUCOSE, CAPILLARY
Glucose-Capillary: 174 mg/dL — ABNORMAL HIGH (ref 70–99)
Glucose-Capillary: 142 mg/dL — ABNORMAL HIGH (ref 70–99)
Glucose-Capillary: 164 mg/dL — ABNORMAL HIGH (ref 70–99)
Glucose-Capillary: 299 mg/dL — ABNORMAL HIGH (ref 70–99)

## 2021-05-10 SURGERY — ARTHROPLASTY, KNEE, TOTAL
Anesthesia: Spinal | Site: Knee | Laterality: Left

## 2021-05-10 MED ORDER — ACETAMINOPHEN 325 MG PO TABS: 325.0000 mg | ORAL_TABLET | Freq: Four times a day (QID) | ORAL | Status: DC | PRN

## 2021-05-10 MED ORDER — 0.9 % SODIUM CHLORIDE (POUR BTL) OPTIME
TOPICAL | Status: DC | PRN
  Administered 2021-05-10: 100 mL

## 2021-05-10 MED ORDER — BUPIVACAINE-EPINEPHRINE (PF) 0.5% -1:200000 IJ SOLN
INTRAMUSCULAR | Status: AC
  Filled 2021-05-10: qty 30

## 2021-05-10 MED ORDER — POLYETHYLENE GLYCOL 3350 17 G PO PACK
17.0000 g | PACK | Freq: Every day | ORAL | Status: DC
  Administered 2021-05-11: 17 g via ORAL
  Filled 2021-05-10: qty 1

## 2021-05-10 MED ORDER — METOCLOPRAMIDE HCL 5 MG/ML IJ SOLN: 5.0000 mg | Freq: Three times a day (TID) | INTRAMUSCULAR | Status: DC | PRN

## 2021-05-10 MED ORDER — TRANEXAMIC ACID-NACL 1000-0.7 MG/100ML-% IV SOLN
INTRAVENOUS | Status: AC
  Filled 2021-05-10: qty 100

## 2021-05-10 MED ORDER — PNEUMOCOCCAL VAC POLYVALENT 25 MCG/0.5ML IJ INJ
0.5000 mL | INJECTION | INTRAMUSCULAR | Status: AC
  Administered 2021-05-11: 0.5 mL via INTRAMUSCULAR
  Filled 2021-05-10: qty 0.5

## 2021-05-10 MED ORDER — LACTATED RINGERS IV SOLN: INTRAVENOUS | Status: DC | PRN

## 2021-05-10 MED ORDER — FLUTICASONE PROPIONATE 50 MCG/ACT NA SUSP
2.0000 | Freq: Every day | NASAL | Status: DC
  Administered 2021-05-11: 2 via NASAL
  Filled 2021-05-10: qty 16

## 2021-05-10 MED ORDER — METHOCARBAMOL 1000 MG/10ML IJ SOLN
500.0000 mg | Freq: Four times a day (QID) | INTRAVENOUS | Status: DC | PRN
  Filled 2021-05-10: qty 5

## 2021-05-10 MED ORDER — INSULIN ASPART 100 UNIT/ML IJ SOLN
0.0000 [IU] | Freq: Every day | INTRAMUSCULAR | Status: DC
  Administered 2021-05-10: 3 [IU] via SUBCUTANEOUS
  Filled 2021-05-10: qty 1

## 2021-05-10 MED ORDER — CEFAZOLIN SODIUM-DEXTROSE 2-4 GM/100ML-% IV SOLN
INTRAVENOUS | Status: AC
  Filled 2021-05-10: qty 100

## 2021-05-10 MED ORDER — CHLORHEXIDINE GLUCONATE 0.12 % MT SOLN
OROMUCOSAL | Status: AC
  Administered 2021-05-10: 15 mL via OROMUCOSAL
  Filled 2021-05-10: qty 15

## 2021-05-10 MED ORDER — ASPIRIN 81 MG PO CHEW
81.0000 mg | CHEWABLE_TABLET | Freq: Two times a day (BID) | ORAL | Status: DC
  Administered 2021-05-10 – 2021-05-11 (×2): 81 mg via ORAL
  Filled 2021-05-10 (×2): qty 1

## 2021-05-10 MED ORDER — ONDANSETRON HCL 4 MG/2ML IJ SOLN
4.0000 mg | Freq: Once | INTRAMUSCULAR | Status: DC | PRN
Start: 2021-05-10 — End: 2021-05-10

## 2021-05-10 MED ORDER — INSULIN ASPART 100 UNIT/ML IJ SOLN
0.0000 [IU] | Freq: Three times a day (TID) | INTRAMUSCULAR | Status: DC
  Administered 2021-05-11 (×2): 3 [IU] via SUBCUTANEOUS
  Filled 2021-05-10 (×2): qty 1

## 2021-05-10 MED ORDER — LACTATED RINGERS IV SOLN: INTRAVENOUS | Status: DC

## 2021-05-10 MED ORDER — ALUM & MAG HYDROXIDE-SIMETH 200-200-20 MG/5ML PO SUSP: 30.0000 mL | ORAL | Status: DC | PRN

## 2021-05-10 MED ORDER — MIDAZOLAM HCL 5 MG/5ML IJ SOLN
INTRAMUSCULAR | Status: DC | PRN
  Administered 2021-05-10: 2 mg via INTRAVENOUS

## 2021-05-10 MED ORDER — MEPERIDINE HCL 25 MG/ML IJ SOLN: 6.2500 mg | INTRAMUSCULAR | Status: DC | PRN

## 2021-05-10 MED ORDER — EZETIMIBE 10 MG PO TABS
10.0000 mg | ORAL_TABLET | Freq: Every day | ORAL | Status: DC
  Administered 2021-05-11: 10 mg via ORAL
  Filled 2021-05-10: qty 1

## 2021-05-10 MED ORDER — ROPIVACAINE HCL 5 MG/ML IJ SOLN
INTRAMUSCULAR | Status: AC
  Filled 2021-05-10: qty 30

## 2021-05-10 MED ORDER — CEFAZOLIN SODIUM-DEXTROSE 2-4 GM/100ML-% IV SOLN
2.0000 g | Freq: Four times a day (QID) | INTRAVENOUS | Status: AC
  Administered 2021-05-10 (×2): 2 g via INTRAVENOUS
  Filled 2021-05-10 (×2): qty 100

## 2021-05-10 MED ORDER — KETOROLAC TROMETHAMINE 15 MG/ML IJ SOLN
7.5000 mg | Freq: Four times a day (QID) | INTRAMUSCULAR | Status: AC
Start: 2021-05-10 — End: 2021-05-11
  Administered 2021-05-10 – 2021-05-11 (×4): 7.5 mg via INTRAVENOUS
  Filled 2021-05-10 (×4): qty 1

## 2021-05-10 MED ORDER — METHOCARBAMOL 500 MG PO TABS
500.0000 mg | ORAL_TABLET | Freq: Four times a day (QID) | ORAL | Status: DC | PRN
  Administered 2021-05-10 – 2021-05-11 (×2): 500 mg via ORAL
  Filled 2021-05-10 (×3): qty 1

## 2021-05-10 MED ORDER — PHENOL 1.4 % MT LIQD
1.0000 | OROMUCOSAL | Status: DC | PRN
  Filled 2021-05-10: qty 177

## 2021-05-10 MED ORDER — PRIMIDONE 50 MG PO TABS
50.0000 mg | ORAL_TABLET | Freq: Two times a day (BID) | ORAL | Status: DC
  Administered 2021-05-10 – 2021-05-11 (×2): 50 mg via ORAL
  Filled 2021-05-10 (×3): qty 1

## 2021-05-10 MED ORDER — SODIUM CHLORIDE 0.9 % IR SOLN
Status: DC | PRN
  Administered 2021-05-10: 1000 mL

## 2021-05-10 MED ORDER — ONDANSETRON HCL 4 MG PO TABS: 4.0000 mg | ORAL_TABLET | Freq: Four times a day (QID) | ORAL | Status: DC | PRN

## 2021-05-10 MED ORDER — SODIUM CHLORIDE FLUSH 0.9 % IV SOLN
INTRAVENOUS | Status: AC
  Filled 2021-05-10: qty 40

## 2021-05-10 MED ORDER — BISACODYL 10 MG RE SUPP: 10.0000 mg | Freq: Every day | RECTAL | Status: DC | PRN

## 2021-05-10 MED ORDER — TRANEXAMIC ACID-NACL 1000-0.7 MG/100ML-% IV SOLN
1000.0000 mg | INTRAVENOUS | Status: AC
  Administered 2021-05-10: 1000 mg via INTRAVENOUS

## 2021-05-10 MED ORDER — SURGIRINSE WOUND IRRIGATION SYSTEM - OPTIME
TOPICAL | Status: DC | PRN
  Administered 2021-05-10: 900 mL via TOPICAL

## 2021-05-10 MED ORDER — KETAMINE HCL 50 MG/ML IJ SOLN
INTRAMUSCULAR | Status: DC | PRN
  Administered 2021-05-10 (×2): 10 mg via INTRAMUSCULAR

## 2021-05-10 MED ORDER — FAMOTIDINE 20 MG PO TABS
ORAL_TABLET | ORAL | Status: AC
  Administered 2021-05-10: 20 mg via ORAL
  Filled 2021-05-10: qty 1

## 2021-05-10 MED ORDER — ONDANSETRON HCL 4 MG/2ML IJ SOLN: 4.0000 mg | Freq: Four times a day (QID) | INTRAMUSCULAR | Status: DC | PRN

## 2021-05-10 MED ORDER — BUPIVACAINE LIPOSOME 1.3 % IJ SUSP
INTRAMUSCULAR | Status: AC
  Filled 2021-05-10: qty 20

## 2021-05-10 MED ORDER — PROPOFOL 10 MG/ML IV BOLUS
INTRAVENOUS | Status: DC | PRN
  Administered 2021-05-10 (×2): 20 mg via INTRAVENOUS

## 2021-05-10 MED ORDER — TRAZODONE HCL 100 MG PO TABS: 200.0000 mg | ORAL_TABLET | Freq: Every evening | ORAL | Status: DC | PRN

## 2021-05-10 MED ORDER — IPRATROPIUM-ALBUTEROL 0.5-2.5 (3) MG/3ML IN SOLN: 3.0000 mL | Freq: Four times a day (QID) | RESPIRATORY_TRACT | Status: DC | PRN

## 2021-05-10 MED ORDER — LIDOCAINE HCL (PF) 1 % IJ SOLN
INTRAMUSCULAR | Status: AC
  Filled 2021-05-10: qty 5

## 2021-05-10 MED ORDER — MENTHOL 3 MG MT LOZG
1.0000 | LOZENGE | OROMUCOSAL | Status: DC | PRN
  Filled 2021-05-10: qty 9

## 2021-05-10 MED ORDER — VITAMIN B-12 1000 MCG PO TABS
1000.0000 ug | ORAL_TABLET | Freq: Every day | ORAL | Status: DC
  Administered 2021-05-11: 1000 ug via ORAL
  Filled 2021-05-10: qty 1

## 2021-05-10 MED ORDER — FENTANYL CITRATE (PF) 100 MCG/2ML IJ SOLN: 25.0000 ug | INTRAMUSCULAR | Status: DC | PRN

## 2021-05-10 MED ORDER — SODIUM CHLORIDE (PF) 0.9 % IJ SOLN
INTRAMUSCULAR | Status: DC | PRN
  Administered 2021-05-10: 90 mL via PERINEURAL

## 2021-05-10 MED ORDER — DOCUSATE SODIUM 100 MG PO CAPS
100.0000 mg | ORAL_CAPSULE | Freq: Two times a day (BID) | ORAL | Status: DC
  Administered 2021-05-10 – 2021-05-11 (×2): 100 mg via ORAL
  Filled 2021-05-10 (×2): qty 1

## 2021-05-10 MED ORDER — FLUTICASONE FUROATE-VILANTEROL 200-25 MCG/INH IN AEPB
1.0000 | INHALATION_SPRAY | Freq: Every day | RESPIRATORY_TRACT | Status: DC
  Administered 2021-05-11: 1 via RESPIRATORY_TRACT
  Filled 2021-05-10: qty 28

## 2021-05-10 MED ORDER — CHLORHEXIDINE GLUCONATE 0.12 % MT SOLN: 15.0000 mL | Freq: Once | OROMUCOSAL | Status: AC

## 2021-05-10 MED ORDER — SODIUM CHLORIDE (PF) 0.9 % IJ SOLN
INTRAMUSCULAR | Status: AC
  Filled 2021-05-10: qty 20

## 2021-05-10 MED ORDER — MORPHINE SULFATE (PF) 2 MG/ML IV SOLN: 0.5000 mg | INTRAVENOUS | Status: DC | PRN

## 2021-05-10 MED ORDER — CLONAZEPAM 0.5 MG PO TABS
0.5000 mg | ORAL_TABLET | Freq: Two times a day (BID) | ORAL | Status: DC | PRN
  Administered 2021-05-10: 0.5 mg via ORAL
  Filled 2021-05-10: qty 1

## 2021-05-10 MED ORDER — DEXMEDETOMIDINE HCL IN NACL 400 MCG/100ML IV SOLN
INTRAVENOUS | Status: DC | PRN
  Administered 2021-05-10 (×2): 8 ug via INTRAVENOUS

## 2021-05-10 MED ORDER — BUPROPION HCL ER (XL) 150 MG PO TB24
150.0000 mg | ORAL_TABLET | Freq: Every day | ORAL | Status: DC
  Administered 2021-05-11: 150 mg via ORAL
  Filled 2021-05-10: qty 1

## 2021-05-10 MED ORDER — POVIDONE-IODINE 10 % EX SWAB
2.0000 "application " | Freq: Once | CUTANEOUS | Status: AC
  Administered 2021-05-10: 2 via TOPICAL

## 2021-05-10 MED ORDER — DEXAMETHASONE SODIUM PHOSPHATE 10 MG/ML IJ SOLN
INTRAMUSCULAR | Status: DC | PRN
  Administered 2021-05-10: 10 mg via INTRAVENOUS

## 2021-05-10 MED ORDER — TRAMADOL HCL 50 MG PO TABS
50.0000 mg | ORAL_TABLET | Freq: Four times a day (QID) | ORAL | Status: DC
  Administered 2021-05-10 – 2021-05-11 (×2): 50 mg via ORAL
  Filled 2021-05-10 (×2): qty 1

## 2021-05-10 MED ORDER — BUPIVACAINE IN DEXTROSE 0.75-8.25 % IT SOLN
INTRATHECAL | Status: DC | PRN
  Administered 2021-05-10: 1.6 mL via INTRATHECAL

## 2021-05-10 MED ORDER — DULOXETINE HCL 60 MG PO CPEP
60.0000 mg | ORAL_CAPSULE | Freq: Two times a day (BID) | ORAL | Status: DC
  Administered 2021-05-10 – 2021-05-11 (×2): 60 mg via ORAL
  Filled 2021-05-10 (×3): qty 1

## 2021-05-10 MED ORDER — ALBUTEROL SULFATE (2.5 MG/3ML) 0.083% IN NEBU: 3.0000 mL | INHALATION_SOLUTION | RESPIRATORY_TRACT | Status: DC | PRN

## 2021-05-10 MED ORDER — SODIUM CHLORIDE 0.9 % IV SOLN: INTRAVENOUS | Status: DC

## 2021-05-10 MED ORDER — GABAPENTIN 100 MG PO CAPS
200.0000 mg | ORAL_CAPSULE | Freq: Two times a day (BID) | ORAL | Status: DC
  Administered 2021-05-10 – 2021-05-11 (×2): 200 mg via ORAL
  Filled 2021-05-10 (×2): qty 2

## 2021-05-10 MED ORDER — FAMOTIDINE 20 MG PO TABS: 20.0000 mg | ORAL_TABLET | Freq: Once | ORAL | Status: AC

## 2021-05-10 MED ORDER — ROPIVACAINE HCL 5 MG/ML IJ SOLN
INTRAMUSCULAR | Status: DC | PRN
  Administered 2021-05-10: 15 mL via PERINEURAL

## 2021-05-10 MED ORDER — METFORMIN HCL ER 500 MG PO TB24
1000.0000 mg | ORAL_TABLET | Freq: Every day | ORAL | Status: DC
  Filled 2021-05-10: qty 2
  Filled 2021-05-10: qty 1

## 2021-05-10 MED ORDER — METOCLOPRAMIDE HCL 10 MG PO TABS: 5.0000 mg | ORAL_TABLET | Freq: Three times a day (TID) | ORAL | Status: DC | PRN

## 2021-05-10 MED ORDER — DIPHENHYDRAMINE HCL 12.5 MG/5ML PO ELIX: 12.5000 mg | ORAL_SOLUTION | ORAL | Status: DC | PRN

## 2021-05-10 MED ORDER — PROPOFOL 500 MG/50ML IV EMUL
INTRAVENOUS | Status: DC | PRN
  Administered 2021-05-10: 25 ug/kg/min via INTRAVENOUS

## 2021-05-10 MED ORDER — HYDROCODONE-ACETAMINOPHEN 7.5-325 MG PO TABS
1.0000 | ORAL_TABLET | ORAL | Status: DC | PRN
  Administered 2021-05-10 – 2021-05-11 (×4): 1 via ORAL
  Filled 2021-05-10 (×4): qty 1

## 2021-05-10 MED ORDER — ORAL CARE MOUTH RINSE: 15.0000 mL | Freq: Once | OROMUCOSAL | Status: AC

## 2021-05-10 MED ORDER — INFLUENZA VAC A&B SA ADJ QUAD 0.5 ML IM PRSY
0.5000 mL | PREFILLED_SYRINGE | INTRAMUSCULAR | Status: AC
  Administered 2021-05-11: 0.5 mL via INTRAMUSCULAR
  Filled 2021-05-10: qty 0.5

## 2021-05-10 MED ORDER — LEVOTHYROXINE SODIUM 50 MCG PO TABS
75.0000 ug | ORAL_TABLET | Freq: Every day | ORAL | Status: DC
  Administered 2021-05-11: 75 ug via ORAL
  Filled 2021-05-10: qty 1

## 2021-05-10 MED ORDER — CEFAZOLIN SODIUM-DEXTROSE 2-4 GM/100ML-% IV SOLN
2.0000 g | INTRAVENOUS | Status: AC
  Administered 2021-05-10: 2 g via INTRAVENOUS

## 2021-05-10 MED ORDER — METOPROLOL SUCCINATE ER 25 MG PO TB24
25.0000 mg | ORAL_TABLET | Freq: Every day | ORAL | Status: DC
  Administered 2021-05-11: 25 mg via ORAL
  Filled 2021-05-10: qty 1

## 2021-05-10 MED ORDER — HYDROCODONE-ACETAMINOPHEN 5-325 MG PO TABS: 1.0000 | ORAL_TABLET | ORAL | Status: DC | PRN

## 2021-05-10 SURGICAL SUPPLY — 65 items
BASEPLATE TIBIAL LT SZ 5 (Knees) ×1 IMPLANT
BLADE SAGITTAL AGGR TOOTH XLG (BLADE) ×2 IMPLANT
BLADE SAW SAG 25X90X1.19 (BLADE) ×2 IMPLANT
BOWL CEMENT MIX W/ADAPTER (MISCELLANEOUS) ×2 IMPLANT
BRUSH SCRUB EZ  4% CHG (MISCELLANEOUS) ×1
BRUSH SCRUB EZ 4% CHG (MISCELLANEOUS) ×1 IMPLANT
CEMENT BONE 40GM (Cement) ×4 IMPLANT
CHLORAPREP W/TINT 26 (MISCELLANEOUS) ×2 IMPLANT
COMP FEM SZ 5 LT GENESIS (Knees) ×2 IMPLANT
COMP PATELLA FENESIS 32 OVAL (Stem) ×2 IMPLANT
COMPONENT FEM SZ 5 LT GENESIS (Knees) ×1 IMPLANT
COMPONENT PTLLA GENS 32 OVAL (Stem) ×1 IMPLANT
COOLER POLAR GLACIER W/PUMP (MISCELLANEOUS) ×2 IMPLANT
CUFF TOURN SGL QUICK 34 (TOURNIQUET CUFF) ×1
CUFF TRNQT CYL 34X4.125X (TOURNIQUET CUFF) ×1 IMPLANT
DRAPE 3/4 80X56 (DRAPES) ×4 IMPLANT
DRAPE INCISE IOBAN 66X60 STRL (DRAPES) ×2 IMPLANT
ELECT REM PT RETURN 9FT ADLT (ELECTROSURGICAL) ×2
ELECTRODE REM PT RTRN 9FT ADLT (ELECTROSURGICAL) ×1 IMPLANT
GAUZE 4X4 16PLY ~~LOC~~+RFID DBL (SPONGE) ×2 IMPLANT
GAUZE SPONGE 4X4 12PLY STRL (GAUZE/BANDAGES/DRESSINGS) ×2 IMPLANT
GAUZE XEROFORM 1X8 LF (GAUZE/BANDAGES/DRESSINGS) ×2 IMPLANT
GLOVE SURG ENC MOIS LTX SZ8 (GLOVE) ×2 IMPLANT
GLOVE SURG ORTHO LTX SZ8 (GLOVE) ×6 IMPLANT
GLOVE SURG UNDER LTX SZ8 (GLOVE) ×2 IMPLANT
GLOVE SURG UNDER POLY LF SZ8.5 (GLOVE) ×2 IMPLANT
GOWN STRL REUS W/ TWL LRG LVL3 (GOWN DISPOSABLE) ×1 IMPLANT
GOWN STRL REUS W/ TWL XL LVL3 (GOWN DISPOSABLE) ×1 IMPLANT
GOWN STRL REUS W/TWL LRG LVL3 (GOWN DISPOSABLE) ×1
GOWN STRL REUS W/TWL XL LVL3 (GOWN DISPOSABLE) ×1
INSERT TIB XLPE 9 SZ 5-6 (Insert) ×2 IMPLANT
IRRIGATION SURGIPHOR STRL (IV SOLUTION) ×2 IMPLANT
IV NS 1000ML (IV SOLUTION) ×1
IV NS 1000ML BAXH (IV SOLUTION) ×1 IMPLANT
KIT TURNOVER KIT A (KITS) ×2 IMPLANT
MANIFOLD NEPTUNE II (INSTRUMENTS) ×2 IMPLANT
MAT ABSORB  FLUID 56X50 GRAY (MISCELLANEOUS) ×1
MAT ABSORB FLUID 56X50 GRAY (MISCELLANEOUS) ×1 IMPLANT
NDL SAFETY ECLIPSE 18X1.5 (NEEDLE) ×1 IMPLANT
NEEDLE HYPO 18GX1.5 SHARP (NEEDLE) ×1
NEEDLE SPNL 20GX3.5 QUINCKE YW (NEEDLE) ×2 IMPLANT
NS IRRIG 1000ML POUR BTL (IV SOLUTION) ×2 IMPLANT
PACK TOTAL KNEE (MISCELLANEOUS) ×2 IMPLANT
PAD ABD DERMACEA PRESS 5X9 (GAUZE/BANDAGES/DRESSINGS) ×2 IMPLANT
PAD CAST CTTN 4X4 STRL (SOFTGOODS) ×2 IMPLANT
PAD DE MAYO PRESSURE PROTECT (MISCELLANEOUS) ×2 IMPLANT
PAD WRAPON POLAR KNEE (MISCELLANEOUS) ×1 IMPLANT
PADDING CAST COTTON 4X4 STRL (SOFTGOODS) ×2
PULSAVAC PLUS IRRIG FAN TIP (DISPOSABLE) ×2
SPONGE T-LAP 18X18 ~~LOC~~+RFID (SPONGE) ×2 IMPLANT
STAPLER SKIN PROX 35W (STAPLE) ×2 IMPLANT
STOCKINETTE BIAS CUT 6 980064 (GAUZE/BANDAGES/DRESSINGS) ×2 IMPLANT
SUCTION FRAZIER HANDLE 10FR (MISCELLANEOUS) ×1
SUCTION TUBE FRAZIER 10FR DISP (MISCELLANEOUS) ×1 IMPLANT
SUT DVC 2 QUILL PDO  T11 36X36 (SUTURE) ×1
SUT DVC 2 QUILL PDO T11 36X36 (SUTURE) ×1 IMPLANT
SUT VIC AB 2-0 CT1 18 (SUTURE) ×2 IMPLANT
SUT VIC AB 2-0 CT1 27 (SUTURE)
SUT VIC AB 2-0 CT1 TAPERPNT 27 (SUTURE) IMPLANT
SUT VIC AB PLUS 45CM 1-MO-4 (SUTURE) ×2 IMPLANT
SYR 30ML LL (SYRINGE) ×6 IMPLANT
TIBIAL BASEPLATE SZ 5 (Knees) ×2 IMPLANT
TIP FAN IRRIG PULSAVAC PLUS (DISPOSABLE) ×1 IMPLANT
WATER STERILE IRR 500ML POUR (IV SOLUTION) ×2 IMPLANT
WRAPON POLAR PAD KNEE (MISCELLANEOUS) ×2

## 2021-05-10 NOTE — Anesthesia Procedure Notes (Signed)
Anesthesia Regional Block: Adductor canal block   Pre-Anesthetic Checklist: , timeout performed,  Correct Patient, Correct Site, Correct Laterality,  Correct Procedure, Correct Position, site marked,  Risks and benefits discussed,  Surgical consent,  Pre-op evaluation,  At surgeon's request and post-op pain management  Laterality: Left  Prep: chloraprep       Needles:  Injection technique: Single-shot  Needle Type: Echogenic Stimulator Needle     Needle Length: 9cm  Needle Gauge: 21     Additional Needles:   Procedures:,,,, ultrasound used (permanent image in chart),,    Narrative:  Injection made incrementally with aspirations every 5 mL.  Performed by: Personally  Anesthesiologist: Molli Barrows, MD  Additional Notes: Single pass and tolerated welll. Neg. Ivsx and no heme or paresthesia.  ja

## 2021-05-10 NOTE — Anesthesia Preprocedure Evaluation (Addendum)
Anesthesia Evaluation  Patient identified by MRN, date of birth, ID band Patient awake    Reviewed: Allergy & Precautions, NPO status , Patient's Chart, lab work & pertinent test results, reviewed documented beta blocker date and time   History of Anesthesia Complications Negative for: history of anesthetic complications  Airway Mallampati: II       Dental  (+) Partial Lower, Upper Dentures   Pulmonary neg sleep apnea, COPD,  COPD inhaler, Current Smoker and Patient abstained from smoking.,     + decreased breath sounds      Cardiovascular hypertension, Pt. on medications and Pt. on home beta blockers (-) Past MI and (-) CHF Normal cardiovascular exam+ dysrhythmias (Palpitations, PVCs)      Neuro/Psych  Headaches, neg Seizures PSYCHIATRIC DISORDERS Anxiety Depression  Neuromuscular disease    GI/Hepatic Neg liver ROS, GERD  Medicated and Controlled,  Endo/Other  diabetes, Type 2, Oral Hypoglycemic AgentsHypothyroidism   Renal/GU negative Renal ROS     Musculoskeletal  (+) Arthritis , Osteoarthritis,  Fibromyalgia -  Abdominal   Peds  Hematology   Anesthesia Other Findings Anxiety    Arthritis    Cancer ([place])  skin cancer, removed  COPD   Depression    Diabetes mellitus without complication ([place]) Dysrhythmia  occasional pvc's  Fatigue    GERD (gastroesophageal reflux disease)  Headache    Hypertension    Hypothyroidism    Thyroid disease    Tremors of nervous system       Reproductive/Obstetrics                           Anesthesia Physical  Anesthesia Plan  ASA: 3  Anesthesia Plan: Spinal   Post-op Pain Management:  Regional for Post-op pain   Induction: Intravenous  PONV Risk Score and Plan: 2 and Ondansetron and Dexamethasone  Airway Management Planned: Mask  Additional Equipment:   Intra-op Plan:   Post-operative Plan:   Informed Consent: I have reviewed the  patients History and Physical, chart, labs and discussed the procedure including the risks, benefits and alternatives for the proposed anesthesia with the patient or authorized representative who has indicated his/her understanding and acceptance.       Plan Discussed with: CRNA, Anesthesiologist and Surgeon  Anesthesia Plan Comments:        Anesthesia Quick Evaluation

## 2021-05-10 NOTE — H&P (Signed)
The patient has been re-examined, and the chart reviewed, and there have been no interval changes to the documented history and physical.  Plan a left total knee today.  Anesthesia is consulted regarding a peripheral nerve block for post-operative pain.  The risks, benefits, and alternatives have been discussed at length, and the patient is willing to proceed.     

## 2021-05-10 NOTE — Anesthesia Procedure Notes (Signed)
Spinal  Patient location during procedure: OR Start time: 05/10/2021 1:42 PM End time: 05/10/2021 1:45 PM Reason for block: at surgeon's request Staffing Performed: anesthesiologist  Anesthesiologist: Phill Mutter, MD Preanesthetic Checklist Completed: patient identified, IV checked, site marked, risks and benefits discussed, surgical consent, monitors and equipment checked, pre-op evaluation and timeout performed Spinal Block Patient position: sitting Prep: Betadine Patient monitoring: heart rate, cardiac monitor and continuous pulse ox Approach: midline Location: L3-4 Injection technique: single-shot Needle Needle type: Pencan  Needle gauge: 24 G Needle length: 5 cm Needle insertion depth: 3 cm Assessment Sensory level: T6 Events: CSF return

## 2021-05-10 NOTE — Transfer of Care (Signed)
Immediate Anesthesia Transfer of Care Note  Patient: Linda Bradford  Procedure(s) Performed: TOTAL KNEE ARTHROPLASTY (Left: Knee)  Patient Location: PACU  Anesthesia Type:Spinal  Level of Consciousness: awake  Airway & Oxygen Therapy: Patient Spontanous Breathing  Post-op Assessment: Report given to RN  Post vital signs: stable  Last Vitals:  Vitals Value Taken Time  BP 119/102 05/10/21 1537  Temp    Pulse 62 05/10/21 1540  Resp 14 05/10/21 1540  SpO2 98 % 05/10/21 1540  Vitals shown include unvalidated device data.  Last Pain:  Vitals:   05/10/21 1044  TempSrc: Temporal  PainSc: 1          Complications: No notable events documented.

## 2021-05-10 NOTE — Op Note (Signed)
DATE OF SURGERY:  05/10/2021 TIME: 3:27 PM  PATIENT NAME:  Linda Bradford   AGE: 65 y.o.    PRE-OPERATIVE DIAGNOSIS:  M17.12 Unilateral primary osteoarthritis, left knee  POST-OPERATIVE DIAGNOSIS:  Same  PROCEDURE:  Procedure(s): TOTAL KNEE ARTHROPLASTY, LEFT  SURGEON:  Lovell Sheehan, MD   ASSISTANT:  Carlynn Spry,  PA-C  OPERATIVE IMPLANTS: Tamala [employer]hew, Cruciate Retaining Oxinium Femoral component size  5, Fixed Bearing Tray size 5, Patella polyethylene 3-peg oval button size 32 mm, with a 9 mm DISH insert.   PREOPERATIVE INDICATIONS:  Linda Bradford is an 65 y.o. female who has a diagnosis of M17.12 Unilateral primary osteoarthritis, left knee and elected for a total knee arthroplasty after failing nonoperative treatment, including activity modification, pain medication, physical therapy and injections who has significant impairment of their activities of daily living.  Radiographs have demonstrated tricompartmental osteoarthritis joint space narrowing, osteophytes, subchondral sclerosis and cyst formation.  The risks, benefits, and alternatives were discussed at length including but not limited to the risks of infection, bleeding, nerve or blood vessel injury, knee stiffness, fracture, dislocation, loosening or failure of the hardware and the need for further surgery. Medical risks include but not limited to DVT and pulmonary embolism, myocardial infarction, stroke, pneumonia, respiratory failure and death. I discussed these risks with the patient in my office prior to the date of surgery. They understood these risks and were willing to proceed.  OPERATIVE FINDINGS AND UNIQUE ASPECTS OF THE CASE:  All three compartments with advanced and severe degenerative changes, large osteophytes and an abundance of synovial fluid. Significant deformity was also noted. A decision was made to proceed with total knee arthroplasty.   OPERATIVE DESCRIPTION:  The patient was brought to the operative  room and placed in a supine position after undergoing placement of a general anesthetic. IV antibiotics were given. Patient received tranexamic acid. The lower extremity was prepped and draped in the usual sterile fashion.  A time out was performed to verify the patient's name, date of birth, medical record number, correct site of surgery and correct procedure to be performed. The timeout was also used to confirm the patient received antibiotics and that appropriate instruments, implants and radiographs studies were available in the room.  The leg was elevated and exsanguinated with an Esmarch and the tourniquet was inflated to 250 mmHg.  A midline incision was made over the left knee.. A medial parapatellar arthrotomy was then made and the patella subluxed laterally and the knee was brought into 90 of flexion. Hoffa's fat pad along with the anterior cruciate ligament was resected and the medial joint line was exposed.  Attention was then turned to preparation of the patella. The thickness of the patella was measured with a caliper, the diameter measured with the patella templates.  The patella resection was then made with an oscillating saw using the patella cutting guide.  The 32 mm button fit appropriately.  3 peg holes for the patella component were then drilled.  The extramedullary tibial cutting guide was then placed using the anterior tibial crest and second ray of the foot as a reference.  The tibial cutting guide was adjusted to allow for appropriate posterior slope.  The tibial cutting block was pinned into position. The slotted stylus was used to measure the proximal tibial resection of 9 mm off the high lateral side. Care was taken during the tibial resection to protect the medial and collateral ligaments.  The resected tibial bone was removed.  The  distal femur was resected using the intramedullary cutting guide.  Care was taken to protect the collateral ligaments during distal femoral  resection.  The distal femoral resection was performed with an oscillating saw. The femoral cutting guide was then removed. Extension gap was measured with a 9 mm spacer block and alignment and extension was confirmed using a long alignment rod. The femur was sized to be a 5. Rotation of the referencing guide was checked with the epicondylar axis and Whitesides line. Then the 4-in-1 cutting jig was then applied to the distal femur. A stylus was used to confirm that the anterior femur would not be notched.   Then the anterior, posterior and chamfer femoral cuts were then made with an oscillating saw.  The knee was distracted and all posterior osteophytes were removed.  The flexion gap was then measured with a flexion spacer block and long alignment rod and was found to be symmetric with the extension gap and perpendicular to mechanical axis of the tibia.  The proximal tibia plateau was then sized with trial trays. The best coverage was achieved with a size 5. This tibial tray was then pinned into position. The proximal tibia was then prepared with the keel punch.  After tibial preparation was completed, all trial components were inserted with polyethylene trials. The knee achieved full extension and flexed to 120 degrees. Ligament were stable to varus and valgus at full extension as well as 30, 60 and 90 degrees of flexion.   The trials were then placed. Knee was taken through a full range of motion and deemed to be stable with the trial components. All trial components were then removed.  The joint was copiously irrigated with pulse lavage.  The final total knee arthroplasty components were then cemented into place. The knee was held in extension while cement was allowed to cure.The knee was taken through a range of motion and the patella tracked well and the knee was again irrigated copiously.  The knee capsule was then injected with Exparel.  The medial arthrotomy was closed with #1 Vicryl and #2 Quill.  The subcutaneous tissue closed with  2-0 vicryl, and skin approximated with staples.  A dry sterile and compressive dressing was applied.  A Polar Care was applied to the operative knee.  The patient was awakened and brought to the PACU in stable and satisfactory condition.  All sharp, lap and instrument counts were correct at the conclusion the case. I spoke with the patient's family in the postop consultation room to let them know the case had been performed without complication and the patient was stable in recovery room.   Total tourniquet time was 51 minutes.

## 2021-05-10 NOTE — Plan of Care (Signed)

## 2021-05-11 DIAGNOSIS — M1712 Unilateral primary osteoarthritis, left knee: Secondary | ICD-10-CM | POA: Diagnosis not present

## 2021-05-11 LAB — CBC
HCT: 35.5 % — ABNORMAL LOW (ref 36.0–46.0)
Hemoglobin: 12.1 g/dL (ref 12.0–15.0)
MCH: 32.6 pg (ref 26.0–34.0)
MCHC: 34.1 g/dL (ref 30.0–36.0)
MCV: 95.7 fL (ref 80.0–100.0)
Platelets: 230 10*3/uL (ref 150–400)
RBC: 3.71 MIL/uL — ABNORMAL LOW (ref 3.87–5.11)
RDW: 12.7 % (ref 11.5–15.5)
WBC: 10.5 10*3/uL (ref 4.0–10.5)
nRBC: 0 % (ref 0.0–0.2)

## 2021-05-11 LAB — BASIC METABOLIC PANEL WITH GFR
Anion gap: 7 (ref 5–15)
BUN: 14 mg/dL (ref 8–23)
CO2: 25 mmol/L (ref 22–32)
Calcium: 8.6 mg/dL — ABNORMAL LOW (ref 8.9–10.3)
Chloride: 102 mmol/L (ref 98–111)
Creatinine, Ser: 0.66 mg/dL (ref 0.44–1.00)
GFR, Estimated: >60 mL/min
Glucose, Bld: 148 mg/dL — ABNORMAL HIGH (ref 70–99)
Potassium: 4.6 mmol/L (ref 3.5–5.1)
Sodium: 134 mmol/L — ABNORMAL LOW (ref 135–145)

## 2021-05-11 LAB — GLUCOSE, CAPILLARY
Glucose-Capillary: 152 mg/dL — ABNORMAL HIGH (ref 70–99)
Glucose-Capillary: 162 mg/dL — ABNORMAL HIGH (ref 70–99)
Glucose-Capillary: 156 mg/dL — ABNORMAL HIGH (ref 70–99)

## 2021-05-11 MED ORDER — TRAMADOL HCL 50 MG PO TABS: 50.0000 mg | ORAL_TABLET | ORAL | 0 refills | Status: DC | PRN

## 2021-05-11 MED ORDER — METHOCARBAMOL 500 MG PO TABS: 500.0000 mg | ORAL_TABLET | Freq: Four times a day (QID) | ORAL | 0 refills | Status: DC | PRN

## 2021-05-11 MED ORDER — DOCUSATE SODIUM 100 MG PO CAPS: 100.0000 mg | ORAL_CAPSULE | Freq: Two times a day (BID) | ORAL | 0 refills | Status: DC

## 2021-05-11 MED ORDER — HYDROCODONE-ACETAMINOPHEN 7.5-325 MG PO TABS: 1.0000 | ORAL_TABLET | ORAL | 0 refills | Status: DC | PRN

## 2021-05-11 MED ORDER — ASPIRIN 81 MG PO CHEW: 81.0000 mg | CHEWABLE_TABLET | Freq: Two times a day (BID) | ORAL | 0 refills | Status: DC

## 2021-05-11 NOTE — Evaluation (Signed)
Physical Therapy Evaluation Patient Details Name: Linda Bradford MRN: [id number] DOB: 04-May-1956 Today's Date: 05/11/2021  History of Present Illness  Pt is a 65 y.o. female s/p L TKA secondary to unilateral primary OA 05/10/21.  PMH includes COPD, htn, dysrhythmias, HA's, anxiety, DM, fibromyalgia, thyroid disease, and tremors of nervous system.  Clinical Impression  Prior to hospital admission, pt was modified independent ambulating with cane; lives with her husband (who pt reports has physical impairments) and sister in law (who pt reports has Down Syndrome) who are both unable to physically assist pt and both do not drive.  Currently pt is CGA with transfers and ambulation 60 feet with RW.  Pain 8/10 L knee at rest beginning of session and 7/10 at rest end of session.  Pt would benefit from skilled PT to address noted impairments and functional limitations (see below for any additional details).  Upon hospital discharge, pt would benefit from [place].      Recommendations for follow up therapy are one component of a multi-disciplinary discharge planning process, led by the attending physician.  Recommendations may be updated based on patient status, additional functional criteria and insurance authorization.  Follow Up Recommendations Home health PT;Supervision for mobility/OOB    Equipment Recommendations  Rolling walker with 5" wheels;3in1 (PT)    Recommendations for Other Services OT consult     Precautions / Restrictions Precautions Precautions: Fall;Knee Precaution Booklet Issued: Yes (comment) Restrictions Weight Bearing Restrictions: Yes LLE Weight Bearing: Weight bearing as tolerated      Mobility  Bed Mobility               General bed mobility comments: Deferred (pt up in recliner beginning/end of session)    Transfers Overall transfer level: Needs assistance Equipment used: Rolling walker (2 wheeled) Transfers: Sit to/from Stand Sit to Stand: Min guard;Min  assist         General transfer comment: x2 trials standing up to RW from recliner (pt unsteady requiring min assist to balance 2nd trial standing)  Ambulation/Gait Ambulation/Gait assistance: Min guard;Min assist Gait Distance (Feet): 60 Feet Assistive device: Rolling walker (2 wheeled)   Gait velocity: decreased   General Gait Details: antalgic; step to progressing to partial step through gait pattern; vc's for walker use and overall gait technique  Stairs            Wheelchair Mobility    Modified Rankin (Stroke Patients Only)       Balance Overall balance assessment: Needs assistance Sitting-balance support: No upper extremity supported;Feet supported Sitting balance-Leahy Scale: Good Sitting balance - Comments: steady sitting reaching within BOS   Standing balance support: Bilateral upper extremity supported Standing balance-Leahy Scale: Fair Standing balance comment: steady static standing but requiring CGA for safety with more dynamic standing activities                             Pertinent Vitals/Pain Pain Assessment: 0-10 Pain Score: 7  (pt received pain medication prior to PT session) Pain Location: L knee Pain Descriptors / Indicators: Aching;Sore;Discomfort Pain Intervention(s): Limited activity within patient's tolerance;Monitored during session;Premedicated before session;Repositioned;Other (comment) (polar care applied) Vitals (HR and O2 on room air) stable and WFL throughout treatment session.    Home Living Family/patient expects to be discharged to:: Private residence Living Arrangements: Spouse/significant other;Other relatives (sister in law) Available Help at Discharge: Friend(s);Available PRN/intermittently Type of Home: House Home Access: Stairs to enter Entrance Stairs-Rails: Left Entrance Stairs-Number of  Steps: 3 Home Layout: One level Home Equipment: Cane - single point;Grab bars - tub/shower Additional Comments: Pt  reports her husband unable to assist her (d/t physical impairments and is also blind in one eye since this summer); pt reports her sister in law has down syndrome and can't help her; pt and pt's SIL both do NOT drive    Prior Function Level of Independence: Independent with assistive device(s)         Comments: Pt reports being ambulatory with [facility] prior to admission.  2 falls in last 6 months (pt tripped on desk; L knee gave out).     Hand Dominance        Extremity/Trunk Assessment   Upper Extremity Assessment Upper Extremity Assessment: Overall WFL for tasks assessed    Lower Extremity Assessment Lower Extremity Assessment: LLE deficits/detail LLE Deficits / Details: at least 3/5 AROM ankle DF/PF; fair L quad set strength; assist required for L LE SLR LLE: Unable to fully assess due to pain    Cervical / Trunk Assessment Cervical / Trunk Assessment: Normal  Communication   Communication: No difficulties  Cognition Arousal/Alertness: Awake/alert (Mostly alert but was a little drowsy beginning of session) Behavior During Therapy: WFL for tasks assessed/performed Overall Cognitive Status: Within Functional Limits for tasks assessed                                        General Comments General comments (skin integrity, edema, etc.): L LE ace wrap in place.  Nursing cleared pt for participation in physical therapy.  Pt agreeable to PT session.    Exercises Total Joint Exercises Ankle Circles/Pumps: AROM;Strengthening;Both;10 reps;Supine Quad Sets: AROM;Strengthening;Both;10 reps;Supine Short Arc Quad: AROM;Strengthening;Left;10 reps;Supine Heel Slides: AAROM;Strengthening;Left;10 reps;Supine Hip ABduction/ADduction: AAROM;Strengthening;Left;10 reps;Supine Straight Leg Raises: AAROM;Strengthening;Left;10 reps;Supine Goniometric ROM: L knee AROM 7-90 degrees   Assessment/Plan    PT Assessment Patient needs continued PT services  PT Problem List  Decreased strength;Decreased range of motion;Decreased activity tolerance;Decreased balance;Decreased mobility;Decreased knowledge of use of DME;Decreased knowledge of precautions;Pain;Decreased skin integrity       PT Treatment Interventions DME instruction;Gait training;Stair training;Functional mobility training;Therapeutic activities;Therapeutic exercise;Balance training;Patient/family education    PT Goals (Current goals can be found in the Care Plan section)  Acute Rehab PT Goals Patient Stated Goal: to go home PT Goal Formulation: With patient Time For Goal Achievement: 05/25/21 Potential to Achieve Goals: Good    Frequency BID   Barriers to discharge        Co-evaluation               AM-PAC PT "6 Clicks" Mobility  Outcome Measure Help needed turning from your back to your side while in a flat bed without using bedrails?: A Little Help needed moving from lying on your back to sitting on the side of a flat bed without using bedrails?: A Little Help needed moving to and from a bed to a chair (including a wheelchair)?: A Little Help needed standing up from a chair using your arms (e.g., wheelchair or bedside chair)?: A Little Help needed to walk in hospital room?: A Little Help needed climbing 3-5 steps with a railing? : A Little 6 Click Score: 18    End of Session Equipment Utilized During Treatment: Gait belt Activity Tolerance: Patient limited by fatigue Patient left: in chair;with call bell/phone within reach;with chair alarm set;Other (comment) (polar care in place; L  heel floating via towel roll) Nurse Communication: Mobility status;Precautions;Weight bearing status PT Visit Diagnosis: Other abnormalities of gait and mobility (R26.89);History of falling (Z91.81);Pain Pain - Right/Left: Left Pain - part of body: Knee    Time: 0375-4360 PT Time Calculation (min) (ACUTE ONLY): 44 min   Charges:   PT Evaluation $PT Eval Low Complexity: 1 Low PT  Treatments $Gait Training: 8-22 mins $Therapeutic Exercise: 8-22 mins       Leitha Bleak, PT 05/11/21, 11:03 AM

## 2021-05-11 NOTE — Plan of Care (Signed)
No acute events this shift. Pt's pain well-controlled with current regimen. Problem: Education: Goal: Knowledge of the prescribed therapeutic regimen will improve Outcome: Progressing Goal: Individualized Educational Video(s) Outcome: Progressing   Problem: Activity: Goal: Ability to avoid complications of mobility impairment will improve Outcome: Progressing Goal: Range of joint motion will improve Outcome: Progressing   Problem: Clinical Measurements: Goal: Postoperative complications will be avoided or minimized Outcome: Progressing   Problem: Pain Management: Goal: Pain level will decrease with appropriate interventions Outcome: Progressing   Problem: Skin Integrity: Goal: Will show signs of wound healing Outcome: Progressing   Problem: Education: Goal: Knowledge of General Education information will improve Description: Including pain rating scale, medication(s)/side effects and non-pharmacologic comfort measures Outcome: Progressing   Problem: Health Behavior/Discharge Planning: Goal: Ability to manage health-related needs will improve Outcome: Progressing   Problem: Clinical Measurements: Goal: Ability to maintain clinical measurements within normal limits will improve Outcome: Progressing Goal: Will remain free from infection Outcome: Progressing Goal: Diagnostic test results will improve Outcome: Progressing Goal: Respiratory complications will improve Outcome: Progressing Goal: Cardiovascular complication will be avoided Outcome: Progressing   Problem: Activity: Goal: Risk for activity intolerance will decrease Outcome: Progressing   Problem: Nutrition: Goal: Adequate nutrition will be maintained Outcome: Progressing   Problem: Coping: Goal: Level of anxiety will decrease Outcome: Progressing   Problem: Elimination: Goal: Will not experience complications related to bowel motility Outcome: Progressing Goal: Will not experience complications  related to urinary retention Outcome: Progressing   Problem: Pain Managment: Goal: General experience of comfort will improve Outcome: Progressing   Problem: Safety: Goal: Ability to remain free from injury will improve Outcome: Progressing   Problem: Skin Integrity: Goal: Risk for impaired skin integrity will decrease Outcome: Progressing

## 2021-05-11 NOTE — Progress Notes (Signed)
Discharge summary reviewed with patient. Patient verbalized full understanding including medications. Per PT patient cleared to DC. All equipment in room. Patient friend, Morey Hummingbird providing transportation home.

## 2021-05-11 NOTE — Discharge Summary (Signed)
Physician Discharge Summary  Patient ID: Linda Bradford MRN: [id number] DOB/AGE: 02-03-1956 65 y.o.  Admit date: 05/10/2021 Discharge date: 05/11/2021  Admission Diagnoses:  M17.12 Unilateral primary osteoarthritis, left knee <principal problem not specified>  Discharge Diagnoses:  M17.12 Unilateral primary osteoarthritis, left knee Active Problems:   S/P total knee replacement, left   Past Medical History:  Diagnosis Date   Anxiety    Arthritis    Cancer ([place])    skin cancer, removed   COPD (chronic obstructive pulmonary disease) ([place])    Depression    Diabetes mellitus without complication ([place])    Dysrhythmia    occasional pvc's. not being treated.   Fatigue    GERD (gastroesophageal reflux disease)    Headache    Hypertension    Hypothyroidism    Thyroid disease    Tremors of nervous system     Surgeries: Procedure(s): TOTAL KNEE ARTHROPLASTY on 05/10/2021   Consultants (if any):   Discharged Condition: Improved  Hospital Course: Linda Bradford is an 65 y.o. female who was admitted 05/10/2021 with a diagnosis of  M17.12 Unilateral primary osteoarthritis, left knee <principal problem not specified> and went to the operating room on 05/10/2021 and underwent the above named procedures.    She was given perioperative antibiotics:  Anti-infectives (From admission, onward)    Start     Dose/Rate Route Frequency Ordered Stop   05/10/21 1845  ceFAZolin (ANCEF) IVPB 2g/100 mL premix        2 g 200 mL/hr over 30 Minutes Intravenous Every 6 hours 05/10/21 1754 05/11/21 0757   05/10/21 1026  ceFAZolin (ANCEF) 2-4 GM/100ML-% IVPB       Note to Pharmacy: Maryagnes Amos   : cabinet override      05/10/21 1026 05/10/21 1416   05/10/21 0600  ceFAZolin (ANCEF) IVPB 2g/100 mL premix        2 g 200 mL/hr over 30 Minutes Intravenous On call to O.R. 05/10/21 0007 05/10/21 1338     .  She was given sequential compression devices, early ambulation, and aspirin for DVT  prophylaxis.  She benefited maximally from the hospital stay and there were no complications.    Recent vital signs:  Vitals:   05/11/21 0720 05/11/21 1115  BP: 128/61 117/61  Pulse: 69 69  Resp: 16 15  Temp: 98.3 F (36.8 C) 98.3 F (36.8 C)  SpO2: 94% 96%    Recent laboratory studies:  Lab Results  Component Value Date   HGB 12.1 05/11/2021   HGB 14.7 04/29/2021   HGB 13.7 04/06/2021   Lab Results  Component Value Date   WBC 10.5 05/11/2021   PLT 230 05/11/2021   Lab Results  Component Value Date   INR 0.9 04/29/2021   Lab Results  Component Value Date   NA 134 (L) 05/11/2021   K 4.6 05/11/2021   CL 102 05/11/2021   CO2 25 05/11/2021   BUN 14 05/11/2021   CREATININE 0.66 05/11/2021   GLUCOSE 148 (H) 05/11/2021    Discharge Medications:   Allergies as of 05/11/2021       Reactions   Dexilant [dexlansoprazole] Nausea Only, Other (See Comments)   Dizziness        Medication List     STOP taking these medications    aspirin EC 81 MG tablet Replaced by: aspirin 81 MG chewable tablet       TAKE these medications    acetaminophen 500 MG tablet Commonly known as: TYLENOL Take 1,000 mg  by mouth every 6 (six) hours as needed for mild pain or moderate pain.   albuterol 108 (90 Base) MCG/ACT inhaler Commonly known as: VENTOLIN HFA INHALE 2 PUFFS INTO THE LUNGS EVERY 4 HOURS AS NEEDED FOR WHEEZING OR SHORTNESS OF BREATH   aspirin 81 MG chewable tablet Chew 1 tablet (81 mg total) by mouth 2 (two) times daily. Replaces: aspirin EC 81 MG tablet   benzonatate 200 MG capsule Commonly known as: TESSALON TAKE 1 CAPSULE BY MOUTH 3 TIMES DAILY ASNEEDED   Breo Ellipta 200-25 MCG/INH Aepb Generic drug: fluticasone furoate-vilanterol INHALE 1 PUFF BY MOUTH ONCE DAILY   buPROPion 150 MG 24 hr tablet Commonly known as: WELLBUTRIN XL TAKE 1 TABLET BY MOUTH ONCE DAILY   clonazePAM 0.5 MG tablet Commonly known as: KLONOPIN TAKE 1/2 TABLET BY MOUTH TWICE  DAILY AS NEEDED What changed: See the new instructions.   diclofenac sodium 1 % Gel Commonly known as: VOLTAREN Apply 4 g topically 4 (four) times daily as needed. What changed: additional instructions   docusate sodium 100 MG capsule Commonly known as: COLACE Take 1 capsule (100 mg total) by mouth 2 (two) times daily.   DULoxetine 60 MG capsule Commonly known as: CYMBALTA TAKE 1 CAPSULE BY MOUTH TWICE DAILY   ergocalciferol 1.25 MG (50000 UT) capsule Commonly known as: VITAMIN D2 Take 1 capsule (50,000 Units total) by mouth once a week. Once a week on Wednesday   ezetimibe 10 MG tablet Commonly known as: ZETIA TAKE 1 TABLET DAILY   fluconazole 150 MG tablet Commonly known as: DIFLUCAN Take one pill on day 1. Take second pill 3 days later if still symptomatic.   fluticasone 50 MCG/ACT nasal spray Commonly known as: FLONASE USE 2 SPRAYS IN EACH NOSTRIL ONCE DAILY What changed: when to take this   gabapentin 300 MG capsule Commonly known as: NEURONTIN TAKE 3 CAPSULES BY MOUTH ONCE EVERY MORNING AND 5 CAPSULES AT BEDTIME   glucose blood test strip Commonly known as: Contour Next Test To check blood sugar once daily   HYDROcodone-acetaminophen 7.5-325 MG tablet Commonly known as: NORCO Take 1 tablet by mouth every 4 (four) hours as needed for severe pain (pain score 7-10).   ipratropium-albuterol 0.5-2.5 (3) MG/3ML Soln Commonly known as: DUONEB Take 3 mLs by nebulization every 6 (six) hours as needed.   levothyroxine 75 MCG tablet Commonly known as: SYNTHROID TAKE 1 TABLET BY MOUTH ONCE DAILY ON AN EMPTY STOMACH. WAIT 30 MINUTES BEFORE TAKING OTHER MEDS.   loperamide 2 MG tablet Commonly known as: IMODIUM A-D Take 2-4 mg by mouth 4 (four) times daily as needed for diarrhea or loose stools.   meloxicam 15 MG tablet Commonly known as: MOBIC Take 1 tablet (15 mg total) by mouth daily.   metaxalone 800 MG tablet Commonly known as: SKELAXIN TAKE 1 TABLET BY  MOUTH 3 TIMES DAILY FORMUSCLE SPASMS   metFORMIN 500 MG 24 hr tablet Commonly known as: GLUCOPHAGE-XR TAKE 2 TABLETS BY MOUTH ONCE DAILY WITH SUPPER   methocarbamol 500 MG tablet Commonly known as: ROBAXIN Take 1 tablet (500 mg total) by mouth every 6 (six) hours as needed for muscle spasms.   metoprolol succinate 50 MG 24 hr tablet Commonly known as: TOPROL-XL TAKE 1 TABLET BY MOUTH ONCE DAILY. TAKE WITH OR IMMEDIATELY FOLLOWING A MEAL   nystatin 100000 UNIT/ML suspension Commonly known as: MYCOSTATIN TAKE 1 TEASPOON BY MOUTH 4 TIMES DAILY What changed:  how much to take how to take this when  to take this reasons to take this additional instructions   polyethylene glycol 17 g packet Commonly known as: MIRALAX / GLYCOLAX Take 17 g by mouth daily. What changed:  when to take this reasons to take this   primidone 50 MG tablet Commonly known as: MYSOLINE Take 50 mg by mouth 2 (two) times daily.   Theo-24 300 MG 24 hr capsule Generic drug: theophylline TAKE 1 CAPSULE BY MOUTH ONCE DAILY   traMADol 50 MG tablet Commonly known as: ULTRAM Take 1 tablet (50 mg total) by mouth every 4 (four) hours as needed for moderate pain.   traZODone 100 MG tablet Commonly known as: DESYREL Take 2 tablets (200 mg total) by mouth at bedtime as needed for sleep.   vitamin B-12 1000 MCG tablet Commonly known as: CYANOCOBALAMIN Take 1,000 mcg by mouth daily.   Vitamin D 50 MCG (2000 UT) tablet Take 2,000 Units by mouth daily.               Durable Medical Equipment  (From admission, onward)           Start     Ordered   05/11/21 1049  For home use only DME Walker rolling  Once       Question Answer Comment  Walker: With [place] Wheels   Patient needs a walker to treat with the following condition Weakness      05/11/21 1048   Unscheduled  For home use only DME Walker rolling  Once       Question Answer Comment  Walker: With 5 Inch Wheels   Patient needs a walker to  treat with the following condition Osteoarthritis of left knee      05/11/21 1312   Unscheduled  For home use only DME 3 n 1  Once        05/11/21 1312            Diagnostic Studies: DG Knee Left Port  Result Date: 05/10/2021 CLINICAL DATA:  Left knee arthroplasty EXAM: PORTABLE LEFT KNEE - 1-2 VIEW COMPARISON:  None. FINDINGS: Frontal and cross-table lateral views of the left knee demonstrate 3 component left knee arthroplasty in the expected position without signs of acute complication. Postsurgical changes are seen within the overlying soft tissues. IMPRESSION: 1. Unremarkable left knee arthroplasty. Electronically Signed   By: Randa Ngo M.D.   On: 05/10/2021 17:50   [place] OR NERVE BLOCK-IMAGE ONLY [facility])  Result Date: 05/10/2021 There is no interpretation for this exam.  This order is for images obtained during a surgical procedure.  Please See "Surgeries" Tab for more information regarding the procedure.    Disposition: Discharge disposition: 01-Home or Self Care            Signed: Carlynn Spry ,PA-C 05/11/2021, 1:12 PM

## 2021-05-11 NOTE — Evaluation (Signed)
Occupational Therapy Evaluation Patient Details Name: Linda Bradford MRN: [id number] DOB: [birth year] Today's Date: 05/11/2021   History of Present Illness Pt is a 65 y.o. female s/p L TKA secondary to unilateral primary OA 05/10/21.  PMH includes COPD, htn, dysrhythmias, HA's, anxiety, DM, fibromyalgia, thyroid disease, and tremors of nervous system.   Clinical Impression   Pt seen for OT evaluation this date, POD#1 from above surgery. Pt was independent in all ADL prior to surgery and is eager to return to PLOF with less pain and improved safety and independence. Pt currently requires PRN minimal assist for LB dressing and bathing while in seated position due to pain and limited AROM of L knee. Pt required Supv - CGA for ADL transfers from EOB and from [facility] with RW and VC for hand placement to improve transfer. Pt/spouse instructed in polar care mgt, falls prevention strategies, home/routines modifications, DME/AE for LB bathing and dressing tasks, and pet care considerations. Handout provided to support recall and carryover. Pt would benefit from skilled OT services including additional instruction in dressing techniques with or without assistive devices for dressing and bathing skills to support recall and carryover prior to discharge and ultimately to maximize safety, independence, and minimize falls risk and caregiver burden. Do not currently anticipate any OT needs following this hospitalization.     Recommendations for follow up therapy are one component of a multi-disciplinary discharge planning process, led by the attending physician.  Recommendations may be updated based on patient status, additional functional criteria and insurance authorization.   Follow Up Recommendations  No OT follow up    Equipment Recommendations       Recommendations for Other Services       Precautions / Restrictions Precautions Precautions: Fall;Knee Precaution Booklet Issued: Yes  (comment) Restrictions Weight Bearing Restrictions: Yes LLE Weight Bearing: Weight bearing as tolerated      Mobility Bed Mobility Overal bed mobility: Modified Independent Bed Mobility: Sit to Supine      Sit to supine: Modified independent (Device/Increase time)   General bed mobility comments: bed flat; mild increased effort/time to perform on own    Transfers Overall transfer level: Needs assistance Equipment used: Rolling walker (2 wheeled) Transfers: Sit to/from Stand Sit to Stand: Supervision;From elevated surface         General transfer comment: x3 trials from recliner; initial vc's for LE placement    Balance Overall balance assessment: Needs assistance Sitting-balance support: No upper extremity supported;Feet supported Sitting balance-Leahy Scale: Normal Sitting balance - Comments: steady sitting reaching outside BOS   Standing balance support: No upper extremity supported;During functional activity Standing balance-Leahy Scale: Good Standing balance comment: steady standing reaching within BOS while washing hands at the sink                           ADL either performed or assessed with clinical judgement   ADL                                         General ADL Comments: PRN MIN A for LB ADL tasks, Supervision for ADL transfers + RW     Vision         Perception     Praxis      Pertinent Vitals/Pain Pain Assessment: 0-10 Pain Score: 7  Pain Location: L knee Pain Descriptors / Indicators: Aching;Sore;Discomfort  Pain Intervention(s): Limited activity within patient's tolerance;Monitored during session;Premedicated before session;Repositioned;Ice applied     Hand Dominance     Extremity/Trunk Assessment Upper Extremity Assessment Upper Extremity Assessment: Overall WFL for tasks assessed   Lower Extremity Assessment Lower Extremity Assessment: LLE deficits/detail LLE Deficits / Details: s/p TKA        Communication Communication Communication: No difficulties   Cognition Arousal/Alertness: Awake/alert Behavior During Therapy: WFL for tasks assessed/performed Overall Cognitive Status: Within Functional Limits for tasks assessed                                     General Comments      Exercises Other Exercises: Pt/spouse instructed in falls prevention, polar care mgt, pet care considerations, AE/DME, and home/routines modifications. Handout provided.   Shoulder Instructions      Home Living Family/patient expects to be discharged to:: Private residence Living Arrangements: Spouse/significant other;Other relatives (sister in law) Available Help at Discharge: Friend(s);Available PRN/intermittently Type of Home: House Home Access: Stairs to enter [employer] of Steps: 3 Entrance Stairs-Rails: Left Home Layout: One level     Bathroom Shower/Tub: Occupational psychologist: Standard     Home Equipment: Cane - single point;Grab bars - tub/shower   Additional Comments: Pt reports her husband unable to assist her (d/t physical impairments and is also blind in one eye since this summer); pt reports her sister in law has down syndrome and can't help her; pt and pt's SIL both do NOT drive      Prior Functioning/Environment Level of Independence: Independent with assistive device(s)        Comments: Pt reports being ambulatory with [facility] prior to admission.  2 falls in last 6 months (pt tripped on desk; L knee gave out).        OT Problem List: Decreased strength;Pain;Decreased range of motion;Impaired balance (sitting and/or standing);Decreased knowledge of use of DME or AE      OT Treatment/Interventions: Self-care/ADL training;Therapeutic exercise;Therapeutic activities;DME and/or AE instruction;Patient/family education;Balance training    OT Goals(Current goals can be found in the care plan section) Acute Rehab OT Goals Patient Stated  Goal: to go home OT Goal Formulation: With patient/family Time For Goal Achievement: 05/25/21 Potential to Achieve Goals: Good ADL Goals Pt Will Perform Lower Body Dressing: with modified independence;with adaptive equipment;sit to/from stand Pt Will Transfer to Toilet: with modified independence;ambulating (BSC over toilet, LRAD) Additional ADL Goal #1: Pt will independently instruct family in polar care mgt  OT Frequency: Min 2X/week   Barriers to D/C: Decreased caregiver support          Co-evaluation              AM-PAC OT "6 Clicks" Daily Activity     Outcome Measure Help from another person eating meals?: None Help from another person taking care of personal grooming?: None Help from another person toileting, which includes using toliet, bedpan, or urinal?: A Little Help from another person bathing (including washing, rinsing, drying)?: A Little Help from another person to put on and taking off regular upper body clothing?: None Help from another person to put on and taking off regular lower body clothing?: A Little 6 Click Score: 21   End of Session Equipment Utilized During Treatment: Rolling walker  Activity Tolerance: Patient tolerated treatment well Patient left: in bed;with call bell/phone within reach;with bed alarm set;with SCD's reapplied;Other (comment) (polar care  in place)  OT Visit Diagnosis: Other abnormalities of gait and mobility (R26.89);Pain Pain - Right/Left: Left Pain - part of body: Knee                Time: 4784-1282 OT Time Calculation (min): 19 min Charges:  OT General Charges $OT Visit: 1 Visit OT Evaluation $OT Eval Low Complexity: 1 Low OT Treatments $Self Care/Home Management : 8-22 mins  Ardeth Perfect., MPH, MS, OTR/L ascom [phone] 05/11/21, 3:50 PM

## 2021-05-11 NOTE — Progress Notes (Signed)
Physical Therapy Treatment Patient Details Name: Linda Bradford MRN: [id number] DOB: [birth year] Today's Date: 05/11/2021   History of Present Illness Pt is a 65 y.o. female s/p L TKA secondary to unilateral primary OA 05/10/21.  PMH includes COPD, htn, dysrhythmias, HA's, anxiety, DM, fibromyalgia, thyroid disease, and tremors of nervous system.    PT Comments    Pt resting in recliner upon PT arrival; agreeable to PT session.  0/10 L knee pain at rest beginning of session but increased to 7/10 with activity.  Modified independent with bed mobility; SBA with transfers; SBA to CGA ambulating 160 feet with RW; and CGA navigating 4 steps with railing.  Pt steady and safe with functional mobility during sessions activities.  Reviewed home safety: pt verbalizing appropriate understanding.  Pt appears safe to discharge home when medically appropriate.    Recommendations for follow up therapy are one component of a multi-disciplinary discharge planning process, led by the attending physician.  Recommendations may be updated based on patient status, additional functional criteria and insurance authorization.  Follow Up Recommendations  Home health PT;Supervision for mobility/OOB     Equipment Recommendations  Rolling walker with 5" wheels;3in1 (PT)    Recommendations for Other Services OT consult     Precautions / Restrictions Precautions Precautions: Fall;Knee Precaution Booklet Issued: Yes (comment) Restrictions Weight Bearing Restrictions: Yes LLE Weight Bearing: Weight bearing as tolerated     Mobility  Bed Mobility Overal bed mobility: Modified Independent Bed Mobility: Supine to Sit;Sit to Supine     Supine to sit: Modified independent (Device/Increase time) Sit to supine: Modified independent (Device/Increase time)   General bed mobility comments: bed flat; mild increased effort/time to perform on own    Transfers Overall transfer level: Needs assistance Equipment used:  Rolling walker (2 wheeled) Transfers: Sit to/from Stand Sit to Stand: Supervision         General transfer comment: x3 trials from recliner; initial vc's for LE placement  Ambulation/Gait Ambulation/Gait assistance: Min guard;Supervision Gait Distance (Feet):  (50 feet; 160 feet) Assistive device: Rolling walker (2 wheeled)   Gait velocity: decreased   General Gait Details: mildly antalgic; step to progressing to partial step through gait pattern; initial vc's for gait technique   Stairs Stairs: Yes Stairs assistance: Min guard Stair Management: One rail Left;Step to pattern;Sideways Number of Stairs: 4 General stair comments: initial vc's for overall technique/sequencing; steady and safe   Wheelchair Mobility    Modified Rankin (Stroke Patients Only)       Balance Overall balance assessment: Needs assistance Sitting-balance support: No upper extremity supported;Feet supported Sitting balance-Leahy Scale: Normal Sitting balance - Comments: steady sitting reaching outside BOS   Standing balance support: No upper extremity supported Standing balance-Leahy Scale: Good Standing balance comment: steady standing reaching within BOS                            Cognition Arousal/Alertness: Awake/alert Behavior During Therapy: WFL for tasks assessed/performed Overall Cognitive Status: Within Functional Limits for tasks assessed                                        Exercises Total Joint Exercises Long Arc Quad: AROM;Strengthening;Left;10 reps;Seated Knee Flexion: AROM;Strengthening;Left;10 reps;Seated Goniometric ROM: L knee AROM 7-90 degrees in AM session General Exercises - Lower Extremity Hip Flexion/Marching: AROM;Strengthening;Both;10 reps;Seated    General Comments General comments (  skin integrity, edema, etc.): L LE ace wrap in place.  RW for home use adjusted for proper height.      Pertinent Vitals/Pain Pain Assessment:  0-10 Pain Score: 7  Pain Location: L knee Pain Descriptors / Indicators: Aching;Sore;Discomfort Pain Intervention(s): Limited activity within patient's tolerance;Monitored during session;Premedicated before session;Other (comment) (OT to apply polar care end of OT session) Vitals (HR and O2 on room air) stable and WFL throughout treatment session.    Home Living Family/patient expects to be discharged to:: Private residence Living Arrangements: Spouse/significant other;Other relatives (sister in law) Available Help at Discharge: Friend(s);Available PRN/intermittently Type of Home: House Home Access: Stairs to enter Entrance Stairs-Rails: Left Home Layout: One level Home Equipment: Cane - single point;Grab bars - tub/shower Additional Comments: Pt reports her husband unable to assist her (d/t physical impairments and is also blind in one eye since this summer); pt reports her sister in law has down syndrome and can't help her; pt and pt's SIL both do NOT drive    Prior Function Level of Independence: Independent with assistive device(s)      Comments: Pt reports being ambulatory with [facility] prior to admission.  2 falls in last 6 months (pt tripped on desk; L knee gave out).   PT Goals (current goals can now be found in the care plan section) Acute Rehab PT Goals Patient Stated Goal: to go home PT Goal Formulation: With patient Time For Goal Achievement: 05/25/21 Potential to Achieve Goals: Good Progress towards PT goals: Progressing toward goals    Frequency    BID      PT Plan Current plan remains appropriate    Co-evaluation              AM-PAC PT "6 Clicks" Mobility   Outcome Measure  Help needed turning from your back to your side while in a flat bed without using bedrails?: None Help needed moving from lying on your back to sitting on the side of a flat bed without using bedrails?: None Help needed moving to and from a bed to a chair (including a wheelchair)?: A  Little Help needed standing up from a chair using your arms (e.g., wheelchair or bedside chair)?: A Little Help needed to walk in hospital room?: A Little Help needed climbing 3-5 steps with a railing? : A Little 6 Click Score: 20    End of Session Equipment Utilized During Treatment: Gait belt Activity Tolerance: Patient tolerated treatment well Patient left:  (sitting edge of bed with OT present) Nurse Communication: Mobility status;Precautions;Weight bearing status PT Visit Diagnosis: Other abnormalities of gait and mobility (R26.89);History of falling (Z91.81);Pain Pain - Right/Left: Left Pain - part of body: Knee     Time: 9629-5284 PT Time Calculation (min) (ACUTE ONLY): 44 min  Charges:  $Gait Training: 8-22 mins $Therapeutic Exercise: 8-22 mins $Therapeutic Activity: 8-22 mins                    Leitha Bleak, PT 05/11/21, 2:33 PM

## 2021-05-11 NOTE — Anesthesia Postprocedure Evaluation (Signed)
Anesthesia Post Note  Patient: Linda Bradford  Procedure(s) Performed: TOTAL KNEE ARTHROPLASTY (Left: Knee)  Patient location during evaluation: PACU Anesthesia Type: Spinal Level of consciousness: awake and alert, awake and oriented Pain management: pain level controlled Vital Signs Assessment: post-procedure vital signs reviewed and stable Respiratory status: spontaneous breathing, nonlabored ventilation and respiratory function stable Cardiovascular status: blood pressure returned to baseline and stable Postop Assessment: no apparent nausea or vomiting Anesthetic complications: no   No notable events documented.   Last Vitals:  Vitals:   05/10/21 1754 05/10/21 2009  BP: (!) 168/73 (!) 148/65  Pulse: 64 76  Resp: 18 17  Temp: 36.6 C 37.2 C  SpO2: 98% 93%    Last Pain:  Vitals:   05/11/21 0525  TempSrc:   PainSc: Asleep                 Phill Mutter

## 2021-05-11 NOTE — Discharge Instructions (Signed)
Continue weight bear as tolerated on the left lower extremity.    Elevate the left lower extremity whenever possible and continue the polar care while elevating the extremity. Patient may shower. No bath or submerging the wound.    Take aspirin as directed for blood clot prevention.  Continue to work on knee range of motion exercises at home as instructed by physical therapy. Continue to use a walker for assistance with ambulation until cleared by physical therapy.  Call [phone] with any questions, such as fever > 101.5 degrees, drainage from the wound or shortness of breath. 

## 2021-05-11 NOTE — Progress Notes (Signed)
Met with the patient in the room to discuss DC plan and needs She lives at home with her husband and her sister in law, her sister in law has down syndrome, her husband has lost his sight and neither drives, she stated that she will get a friend to provide transportation to the doctor to follow up, she will need a rolling walker and a 3 in 1, Rhonda with [employer] will deliver to the room prior to DC, Alvis Lemmings has accepted her for [facility] Pt, she can afford her medications

## 2021-05-11 NOTE — Progress Notes (Signed)
Subjective:  Patient reports pain as mild to moderate.    Objective:   VITALS:   Vitals:   05/10/21 1754 05/10/21 1800 05/10/21 2009 05/11/21 0720  BP: (!) 168/73  (!) 148/65 128/61  Pulse: 64  76 69  Resp: _0 Temp: 97.9 F (36.6 C)  98.9 F (37.2 C) 98.3 F (36.8 C)  TempSrc:      SpO2: 98%  93% 94%  Weight:  106.8 kg    Height:  _1  (1.803 m)      PHYSICAL EXAM:  Neurologically intact ABD soft Neurovascular intact Sensation intact distally Intact pulses distally Dorsiflexion/Plantar flexion intact Incision: scant drainage and drsg changed No cellulitis present Compartment soft  LABS  Results for orders placed or performed during the hospital encounter of 05/10/21 (from the past 24 hour(s))  Glucose, capillary     Status: Abnormal   Collection Time: 05/10/21 10:30 AM  Result Value Ref Range   Glucose-Capillary 174 (H) 70 - 99 mg/dL  Type and screen [place]     Status: None   Collection Time: 05/10/21 10:55 AM  Result Value Ref Range   ABO/RH(D) O POS    Antibody Screen NEG    Sample Expiration      05/13/2021,2359 Performed at [facility], [address] [place], [place] [address]   Glucose, capillary     Status: Abnormal   Collection Time: 05/10/21  3:49 PM  Result Value Ref Range   Glucose-Capillary 142 (H) 70 - 99 mg/dL  Glucose, capillary     Status: Abnormal   Collection Time: 05/10/21  6:14 PM  Result Value Ref Range   Glucose-Capillary 164 (H) 70 - 99 mg/dL  Glucose, capillary     Status: Abnormal   Collection Time: 05/10/21  8:19 PM  Result Value Ref Range   Glucose-Capillary 299 (H) 70 - 99 mg/dL   Comment 1 Notify RN   Basic metabolic panel     Status: Abnormal   Collection Time: 05/11/21  7:05 AM  Result Value Ref Range   Sodium 134 (L) 135 - 145 mmol/L   Potassium 4.6 3.5 - 5.1 mmol/L   Chloride 102 98 - 111 mmol/L   CO2 25 22 - 32 mmol/L   Glucose, Bld 148 (H) 70 - 99 mg/dL   BUN 14  8 - 23 mg/dL   Creatinine, Ser 0.66 0.44 - 1.00 mg/dL   Calcium 8.6 (L) 8.9 - 10.3 mg/dL   GFR, Estimated >60 >60 mL/min   Anion gap 7 5 - 15  Glucose, capillary     Status: Abnormal   Collection Time: 05/11/21  7:21 AM  Result Value Ref Range   Glucose-Capillary 152 (H) 70 - 99 mg/dL   Comment 1 Notify RN    Comment 2 Document in Chart     DG Knee Left Port  Result Date: 05/10/2021 CLINICAL DATA:  Left knee arthroplasty EXAM: PORTABLE LEFT KNEE - 1-2 VIEW COMPARISON:  None. FINDINGS: Frontal and cross-table lateral views of the left knee demonstrate 3 component left knee arthroplasty in the expected position without signs of acute complication. Postsurgical changes are seen within the overlying soft tissues. IMPRESSION: 1. Unremarkable left knee arthroplasty. Electronically Signed   By: Randa Ngo M.D.   On: 05/10/2021 17:50   [place] OR NERVE BLOCK-IMAGE ONLY [facility])  Result Date: 05/10/2021 There is no interpretation for this exam.  This order is for images obtained during a surgical procedure.  Please See "Surgeries" Tab for more information regarding the procedure.    Assessment/Plan: 1 Day Post-Op   Active Problems:   S/P total knee replacement, left   Advance diet Up with therapy Discharge with HHPT when PT goals met  Today vs tomorrow Follow up in office in 2 weeks, Call to confirm appt [place] , PA-C 05/11/2021, 8:02 AM

## 2021-05-14 ENCOUNTER — Other Ambulatory Visit: Payer: Self-pay | Admitting: Family Medicine

## 2021-05-14 DIAGNOSIS — E039 Hypothyroidism, unspecified: Secondary | ICD-10-CM

## 2021-05-17 ENCOUNTER — Encounter: Payer: Self-pay | Admitting: Orthopedic Surgery

## 2021-05-17 DIAGNOSIS — Z96659 Presence of unspecified artificial knee joint: Secondary | ICD-10-CM | POA: Insufficient documentation

## 2021-05-23 ENCOUNTER — Other Ambulatory Visit: Payer: Self-pay | Admitting: Family Medicine

## 2021-05-23 DIAGNOSIS — F419 Anxiety disorder, unspecified: Secondary | ICD-10-CM

## 2021-05-23 DIAGNOSIS — E119 Type 2 diabetes mellitus without complications: Secondary | ICD-10-CM

## 2021-05-24 NOTE — Telephone Encounter (Signed)
last RF 04/22/21 #60 2 RF

## 2021-06-08 DIAGNOSIS — M1712 Unilateral primary osteoarthritis, left knee: Secondary | ICD-10-CM | POA: Insufficient documentation

## 2021-06-09 ENCOUNTER — Other Ambulatory Visit: Payer: Self-pay | Admitting: Family Medicine

## 2021-06-09 DIAGNOSIS — F419 Anxiety disorder, unspecified: Secondary | ICD-10-CM

## 2021-06-21 ENCOUNTER — Other Ambulatory Visit: Payer: Self-pay | Admitting: Family Medicine

## 2021-06-21 DIAGNOSIS — E039 Hypothyroidism, unspecified: Secondary | ICD-10-CM

## 2021-06-21 DIAGNOSIS — S39012A Strain of muscle, fascia and tendon of lower back, initial encounter: Secondary | ICD-10-CM

## 2021-07-10 ENCOUNTER — Other Ambulatory Visit: Payer: Self-pay | Admitting: Family Medicine

## 2021-07-10 DIAGNOSIS — Z9109 Other allergy status, other than to drugs and biological substances: Secondary | ICD-10-CM

## 2021-07-10 NOTE — Telephone Encounter (Signed)
Requested Prescriptions  Pending Prescriptions Disp Refills  . fluticasone (FLONASE) 50 MCG/ACT nasal spray [Pharmacy Med Name: FLUTICASONE PROPIONATE 50 MCG/ACT N] 48 g 1    Sig: USE 2 SPRAYS IN EACH NOSTRIL ONCE DAILY     Ear, Nose, and Throat: Nasal Preparations - Corticosteroids Passed - 07/10/2021 11:40 AM      Passed - Valid encounter within last 12 months    Recent Outpatient Visits          3 months ago Diabetes mellitus without complication [facility])   [place], PA-C   9 months ago Strain of lumbar region, initial encounter   [place], [place]   11 months ago Tremor of both hands   [place], Clearnce Sorrel, [place]   1 year ago [place], Wendee Beavers, [place]   1 year ago Primary insomnia   [place], Clearnce [place], [place]      Future Appointments            In 1 month Rollene Rotunda, Jaci Standard, [place], [place]

## 2021-07-12 ENCOUNTER — Telehealth: Payer: Self-pay | Admitting: Family Medicine

## 2021-07-12 ENCOUNTER — Other Ambulatory Visit: Payer: Self-pay

## 2021-07-12 DIAGNOSIS — F331 Major depressive disorder, recurrent, moderate: Secondary | ICD-10-CM

## 2021-07-12 DIAGNOSIS — F411 Generalized anxiety disorder: Secondary | ICD-10-CM

## 2021-07-12 NOTE — Telephone Encounter (Signed)
[facility] faxed refill request for the following medications:  buPROPion (WELLBUTRIN XL) 150 MG 24 hr tablet   Please advise.

## 2021-07-15 NOTE — Telephone Encounter (Signed)
Please advise refill?  LOV: 04/01/2021 NOV: 08/10/2021 Last refill: 03/03/2021 #90 w/0 refills

## 2021-07-16 MED ORDER — BUPROPION HCL ER (XL) 150 MG PO TB24: 150.0000 mg | ORAL_TABLET | Freq: Every day | ORAL | 0 refills | Status: DC

## 2021-07-27 ENCOUNTER — Other Ambulatory Visit: Payer: Self-pay | Admitting: Family Medicine

## 2021-07-27 DIAGNOSIS — E119 Type 2 diabetes mellitus without complications: Secondary | ICD-10-CM

## 2021-07-27 DIAGNOSIS — E039 Hypothyroidism, unspecified: Secondary | ICD-10-CM

## 2021-08-10 ENCOUNTER — Ambulatory Visit: Payer: Medicare PPO | Admitting: Family Medicine

## 2021-08-10 NOTE — Progress Notes (Deleted)
Established patient visit   Patient: Linda Bradford   DOB: [birth year]   66 y.o. Female  MRN: [id number] Visit Date: 08/10/2021  Today's healthcare provider: [name], FNP   No chief complaint on file.  Subjective    HPI  Hypothyroid, follow-up  Lab Results  Component Value Date   TSH 4.790 (H) 04/06/2021   TSH 2.16 05/19/2017   TSH 6.720 (H) 04/25/2016   T4TOTAL 10.3 04/06/2021    Wt Readings from Last 3 Encounters:  05/10/21 235 lb 6.4 oz (106.8 kg)  04/29/21 227 lb 11.2 oz (103.3 kg)  04/01/21 228 lb 6.4 oz (103.6 kg)    She was last seen for hypothyroid {NUMBERS 1-12:18279} {days/wks/mos/yrs:310907} ago.  Management since that visit includes ***. She reports {excellent/good/fair/poor:19665} compliance with treatment. She {is/is not:21021397} having side effects. {document side effects if present:1}  Symptoms: {Yes/No:20286} change in energy level {Yes/No:20286} constipation  {Yes/No:20286} diarrhea {Yes/No:20286} heat / cold intolerance  {Yes/No:20286} nervousness {Yes/No:20286} palpitations  {Yes/No:20286} weight changes    -----------------------------------------------------------------------------------------   Medications: Outpatient Medications Prior to Visit  Medication Sig   acetaminophen (TYLENOL) 500 MG tablet Take 1,000 mg by mouth every 6 (six) hours as needed for mild pain or moderate pain.    albuterol (VENTOLIN HFA) 108 (90 Base) MCG/ACT inhaler INHALE 2 PUFFS INTO THE LUNGS EVERY 4 HOURS AS NEEDED FOR WHEEZING OR SHORTNESS OF BREATH   aspirin 81 MG chewable tablet Chew 1 tablet (81 mg total) by mouth 2 (two) times daily.   benzonatate (TESSALON) 200 MG capsule TAKE 1 CAPSULE BY MOUTH 3 TIMES DAILY ASNEEDED   BREO ELLIPTA 200-25 MCG/INH AEPB INHALE 1 PUFF BY MOUTH ONCE DAILY   buPROPion (WELLBUTRIN XL) 150 MG 24 hr tablet Take 1 tablet (150 mg total) by mouth daily.   Cholecalciferol (VITAMIN D) 50 MCG (2000 UT) tablet Take 2,000 Units by  mouth daily.   clonazePAM (KLONOPIN) 0.5 MG tablet TAKE 1/2 TABLET BY MOUTH TWICE DAILY AS NEEDED (Patient taking differently: Take 0.5 mg by mouth 2 (two) times daily as needed for anxiety.)   diclofenac sodium (VOLTAREN) 1 % GEL Apply 4 g topically 4 (four) times daily as needed. (Patient taking differently: Apply 4 g topically 4 (four) times daily as needed. Apply to knees)   docusate sodium (COLACE) 100 MG capsule Take 1 capsule (100 mg total) by mouth 2 (two) times daily.   DULoxetine (CYMBALTA) 60 MG capsule TAKE 1 CAPSULE BY MOUTH TWICE DAILY   ergocalciferol (VITAMIN D2) 1.25 MG (50000 UT) capsule Take 1 capsule (50,000 Units total) by mouth once a week. Once a week on Wednesday   ezetimibe (ZETIA) 10 MG tablet TAKE 1 TABLET DAILY   fluconazole (DIFLUCAN) 150 MG tablet Take one pill on day 1. Take second pill 3 days later if still symptomatic.   fluticasone (FLONASE) 50 MCG/ACT nasal spray USE 2 SPRAYS IN EACH NOSTRIL ONCE DAILY   gabapentin (NEURONTIN) 300 MG capsule TAKE 3 CAPSULES BY MOUTH ONCE EVERY MORNING AND 5 CAPSULES AT BEDTIME   glucose blood (CONTOUR NEXT TEST) test strip To check blood sugar once daily   HYDROcodone-acetaminophen (NORCO) 7.5-325 MG tablet Take 1 tablet by mouth every 4 (four) hours as needed for severe pain (pain score 7-10).   ipratropium-albuterol (DUONEB) 0.5-2.5 (3) MG/3ML SOLN Take 3 mLs by nebulization every 6 (six) hours as needed.   levothyroxine (SYNTHROID) 75 MCG tablet TAKE 1 TABLET BY MOUTH ONCE DAILY ON AN EMPTY STOMACH. WAIT  30 MINUTES BEFORE TAKING OTHER MEDS.   loperamide (IMODIUM A-D) 2 MG tablet Take 2-4 mg by mouth 4 (four) times daily as needed for diarrhea or loose stools.   meloxicam (MOBIC) 15 MG tablet Take 1 tablet (15 mg total) by mouth daily.   metaxalone (SKELAXIN) 800 MG tablet TAKE 1 TABLET BY MOUTH 3 TIMES DAILY FORMUSCLE SPASMS   metFORMIN (GLUCOPHAGE-XR) 500 MG 24 hr tablet TAKE 2 TABLETS BY MOUTH ONCE A DAY WITH SUPPER    methocarbamol (ROBAXIN) 500 MG tablet Take 1 tablet (500 mg total) by mouth every 6 (six) hours as needed for muscle spasms.   metoprolol succinate (TOPROL-XL) 50 MG 24 hr tablet TAKE 1 TABLET BY MOUTH ONCE DAILY. TAKE WITH OR IMMEDIATELY FOLLOWING A MEAL   nystatin (MYCOSTATIN) 100000 UNIT/ML suspension TAKE 1 TEASPOON BY MOUTH 4 TIMES DAILY (Patient taking differently: Use as directed 5 mLs in the mouth or throat 4 (four) times daily as needed (thrush).)   polyethylene glycol (MIRALAX / GLYCOLAX) packet Take 17 g by mouth daily. (Patient taking differently: Take 17 g by mouth daily as needed for moderate constipation.)   primidone (MYSOLINE) 50 MG tablet Take 50 mg by mouth 2 (two) times daily.   THEO-24 300 MG 24 hr capsule TAKE 1 CAPSULE BY MOUTH ONCE DAILY (Patient not taking: No sig reported)   traMADol (ULTRAM) 50 MG tablet Take 1 tablet (50 mg total) by mouth every 4 (four) hours as needed for moderate pain.   traZODone (DESYREL) 100 MG tablet Take 2 tablets (200 mg total) by mouth at bedtime as needed for sleep.   vitamin B-12 (CYANOCOBALAMIN) 1000 MCG tablet Take 1,000 mcg by mouth daily.   No facility-administered medications prior to visit.    Review of Systems  {Labs   Heme   Chem   Endocrine   Serology   Results Review (optional):23779}   Objective    There were no vitals taken for this visit. {Show previous vital signs (optional):23777}  Physical Exam  ***  No results found for any visits on 08/10/21.  Assessment & Plan     ***  No follow-ups on file.      {provider attestation***:1}   [name], [place] [phone] (phone) [phone] (fax)  [place]

## 2021-08-24 ENCOUNTER — Other Ambulatory Visit: Payer: Self-pay | Admitting: Family Medicine

## 2021-08-24 ENCOUNTER — Other Ambulatory Visit: Payer: Self-pay | Admitting: Physician Assistant

## 2021-08-24 DIAGNOSIS — F411 Generalized anxiety disorder: Secondary | ICD-10-CM

## 2021-08-24 DIAGNOSIS — J418 Mixed simple and mucopurulent chronic bronchitis: Secondary | ICD-10-CM

## 2021-08-24 DIAGNOSIS — F331 Major depressive disorder, recurrent, moderate: Secondary | ICD-10-CM

## 2021-08-24 DIAGNOSIS — E039 Hypothyroidism, unspecified: Secondary | ICD-10-CM

## 2021-08-24 DIAGNOSIS — E119 Type 2 diabetes mellitus without complications: Secondary | ICD-10-CM

## 2021-08-24 NOTE — Telephone Encounter (Signed)
Requested Prescriptions  Pending Prescriptions Disp Refills   buPROPion (WELLBUTRIN XL) 150 MG 24 hr tablet [Pharmacy Med Name: BUPROPION HCL ER (XL) 150 MG TAB] 90 tablet 0    Sig: TAKE 1 TABLET BY MOUTH ONCE DAILY. *MAKEAPPOINTMENT FOR FURTHER FILLS*     Psychiatry: Antidepressants - bupropion Passed - 08/24/2021  2:50 PM      Passed - Completed PHQ-2 or PHQ-9 in the last 360 days      Passed - Last BP in normal range    BP Readings from Last 1 Encounters:  05/11/21 (!) 135/53         Passed - Valid encounter within last 6 months    Recent Outpatient Visits          4 months ago Diabetes mellitus without complication [facility])   [place], PA-C   11 months ago Strain of lumbar region, initial encounter   [employer], Clearnce Sorrel, [place]   1 year ago Tremor of both hands   [place], Clearnce Sorrel, [place]   1 year ago [place], Wendee Beavers, [place]   1 year ago Primary insomnia   [place], [place]      Future Appointments            In 1 week [name], [place], [facility]           Signed Prescriptions Disp Refills   levothyroxine (SYNTHROID) 75 MCG tablet 30 tablet 0    Sig: TAKE 1 TABLET BY MOUTH ONCE DAILY ON AN EMPTY STOMACH. WAIT 30 MINUTES BEFORE TAKING OTHER MEDS.     Endocrinology:  Hypothyroid Agents Failed - 08/24/2021  2:50 PM      Failed - TSH needs to be rechecked within 3 months after an abnormal result. Refill until TSH is due.      Failed - TSH in normal range and within 360 days    TSH  Date Value Ref Range Status  04/06/2021 4.790 (H) 0.450 - 4.500 uIU/mL Final         Passed - Valid encounter within last 12 months    Recent Outpatient Visits          4 months ago Diabetes mellitus without complication [facility])   [place], PA-C   11 months ago Strain  of lumbar region, initial encounter   [employer], Clearnce Sorrel, [place]   1 year ago Tremor of both hands   [place], Clearnce Sorrel, [place]   1 year ago [place], Wendee Beavers, [place]   1 year ago Primary insomnia   [place], Clearnce Sorrel, [place]      Future Appointments            In 1 week [name], [place], [place]

## 2021-08-24 NOTE — Telephone Encounter (Signed)
Requested Prescriptions  Pending Prescriptions Disp Refills   metFORMIN (GLUCOPHAGE-XR) 500 MG 24 hr tablet [Pharmacy Med Name: METFORMIN HCL ER 500 MG TAB] 180 tablet 0    Sig: TAKE 2 TABLETS BY MOUTH ONCE A DAY WITH SUPPER     Endocrinology:  Diabetes - Biguanides Passed - 08/24/2021  2:37 PM      Passed - Cr in normal range and within 360 days    Creat  Date Value Ref Range Status  05/19/2017 0.58 0.50 - 0.99 mg/dL Final    Comment:    For patients >107 years of age, the reference limit for Creatinine is approximately 13% higher for people identified as African-American. .    Creatinine, Ser  Date Value Ref Range Status  05/11/2021 0.66 0.44 - 1.00 mg/dL Final         Passed - HBA1C is between 0 and 7.9 and within 180 days    Hgb A1c MFr Bld  Date Value Ref Range Status  04/06/2021 6.4 (H) 4.8 - 5.6 % Final    Comment:             Prediabetes: 5.7 - 6.4          Diabetes: >6.4          Glycemic control for adults with diabetes: <7.0          Passed - eGFR in normal range and within 360 days    GFR, Est African American  Date Value Ref Range Status  05/19/2017 115 > OR = 60 mL/min/1.32m Final   GFR calc Af Amer  Date Value Ref Range Status  07/06/2019 >60 >60 mL/min Final   GFR, Est Non African American  Date Value Ref Range Status  05/19/2017 99 > OR = 60 mL/min/1.794mFinal   GFR, Estimated  Date Value Ref Range Status  05/11/2021 >60 >60 mL/min Final    Comment:    (NOTE) Calculated using the CKD-EPI Creatinine Equation (2021)    eGFR  Date Value Ref Range Status  04/06/2021 97 >59 mL/min/1.73 Final         Passed - Valid encounter within last 6 months    Recent Outpatient Visits          [facility]   11 months ago Strain of lumbar region, initial encounter   [place] 1 year ago Tremor of both hands    [place] 1 year ago [place] 1 year ago Primary insomnia   [place]    Future Appointments            In 1 week [place] Spr[place]

## 2021-08-24 NOTE — Telephone Encounter (Signed)
Requested Prescriptions  Pending Prescriptions Disp Refills   levothyroxine (SYNTHROID) 75 MCG tablet [Pharmacy Med Name: LEVOTHYROXINE SODIUM 75 MCG TAB] 30 tablet 0    Sig: TAKE 1 TABLET BY MOUTH ONCE DAILY ON AN EMPTY STOMACH. WAIT 30 MINUTES BEFORE TAKING OTHER MEDS.     Endocrinology:  Hypothyroid Agents Failed - 08/24/2021  2:50 PM      Failed - TSH needs to be rechecked within 3 months after an abnormal result. Refill until TSH is due.      Failed - TSH in normal range and within 360 days    TSH  Date Value Ref Range Status  04/06/2021 4.790 (H) 0.450 - 4.500 uIU/mL Final         Passed - Valid encounter within last 12 months    Recent Outpatient Visits          4 months ago Diabetes mellitus without complication [facility])   [place], PA-C   11 months ago Strain of lumbar region, initial encounter   [employer], Clearnce Sorrel, [place]   1 year ago Tremor of both hands   [place], Clearnce Sorrel, [place]   1 year ago [place], Wendee Beavers, [place]   1 year ago Primary insomnia   [place], [place]      Future Appointments            In 1 week [name], [place], [facility]            buPROPion (WELLBUTRIN XL) 150 MG 24 hr tablet [Pharmacy Med Name: BUPROPION HCL ER (XL) 150 MG TAB] 90 tablet 0    Sig: TAKE 1 TABLET BY MOUTH ONCE DAILY. *MAKEAPPOINTMENT FOR FURTHER FILLS*     Psychiatry: Antidepressants - bupropion Passed - 08/24/2021  2:50 PM      Passed - Completed PHQ-2 or PHQ-9 in the last 360 days      Passed - Last BP in normal range    BP Readings from Last 1 Encounters:  05/11/21 (!) 135/53         Passed - Valid encounter within last 6 months    Recent Outpatient Visits          4 months ago Diabetes mellitus without complication [facility])   [place], PA-C   11  months ago Strain of lumbar region, initial encounter   [employer], Clearnce Sorrel, [place]   1 year ago Tremor of both hands   [place], Clearnce Sorrel, [place]   1 year ago [place], Wendee Beavers, [place]   1 year ago Primary insomnia   [place], Clearnce Sorrel, [place]      Future Appointments            In 1 week [name], [place], [place]

## 2021-09-02 ENCOUNTER — Ambulatory Visit
Admission: RE | Admit: 2021-09-02 | Discharge: 2021-09-02 | Disposition: A | Discharge status: Home / Self Care | Payer: Medicare PPO | Source: Ambulatory Visit | Attending: Family Medicine | Admitting: Family Medicine

## 2021-09-02 ENCOUNTER — Ambulatory Visit (INDEPENDENT_AMBULATORY_CARE_PROVIDER_SITE_OTHER): Payer: Medicare PPO | Admitting: Family Medicine

## 2021-09-02 ENCOUNTER — Encounter: Payer: Self-pay | Admitting: Family Medicine

## 2021-09-02 ENCOUNTER — Other Ambulatory Visit: Payer: Self-pay

## 2021-09-02 ENCOUNTER — Ambulatory Visit
Admission: RE | Admit: 2021-09-02 | Discharge: 2021-09-02 | Disposition: A | Discharge status: Home / Self Care | Payer: Medicare PPO | Attending: Family Medicine | Admitting: Family Medicine

## 2021-09-02 VITALS — BP 141/66 | HR 71 | Temp 98.3°F | Resp 16 | Wt 216.6 lb

## 2021-09-02 DIAGNOSIS — E1169 Type 2 diabetes mellitus with other specified complication: Secondary | ICD-10-CM | POA: Diagnosis not present

## 2021-09-02 DIAGNOSIS — F411 Generalized anxiety disorder: Secondary | ICD-10-CM

## 2021-09-02 DIAGNOSIS — E1159 Type 2 diabetes mellitus with other circulatory complications: Secondary | ICD-10-CM

## 2021-09-02 DIAGNOSIS — J441 Chronic obstructive pulmonary disease with (acute) exacerbation: Secondary | ICD-10-CM | POA: Insufficient documentation

## 2021-09-02 DIAGNOSIS — F17200 Nicotine dependence, unspecified, uncomplicated: Secondary | ICD-10-CM | POA: Diagnosis not present

## 2021-09-02 DIAGNOSIS — E039 Hypothyroidism, unspecified: Secondary | ICD-10-CM

## 2021-09-02 DIAGNOSIS — N898 Other specified noninflammatory disorders of vagina: Secondary | ICD-10-CM | POA: Insufficient documentation

## 2021-09-02 DIAGNOSIS — I152 Hypertension secondary to endocrine disorders: Secondary | ICD-10-CM

## 2021-09-02 DIAGNOSIS — E1165 Type 2 diabetes mellitus with hyperglycemia: Secondary | ICD-10-CM | POA: Insufficient documentation

## 2021-09-02 DIAGNOSIS — E785 Hyperlipidemia, unspecified: Secondary | ICD-10-CM

## 2021-09-02 MED ORDER — TRELEGY ELLIPTA 200-62.5-25 MCG/ACT IN AEPB: 1.0000 | INHALATION_SPRAY | Freq: Two times a day (BID) | RESPIRATORY_TRACT | 11 refills | Status: DC

## 2021-09-02 MED ORDER — HYDROXYZINE HCL 10 MG PO TABS: 10.0000 mg | ORAL_TABLET | Freq: Three times a day (TID) | ORAL | 0 refills | Status: DC | PRN

## 2021-09-02 NOTE — Assessment & Plan Note (Signed)
Chronic, slight elevation  Reinforce goal 130/80 or less Denies CP Denies SOB Denies DOE No LE Edema noted on exam Continue medication Refills stable Seek emergent care if you develop CP, chest pain or chest pressure

## 2021-09-02 NOTE — Progress Notes (Signed)
Established patient visit   Patient: Linda Bradford   DOB: July 01, 1956   66 y.o. Female  MRN: [id number] Visit Date: 09/02/2021  Today's healthcare provider: [name], FNP   Chief Complaint  Patient presents with   Diabetes   Hypertension   Hypothyroidism   Subjective    HPI  Diabetes Mellitus Type II, Follow-up  Lab Results  Component Value Date   HGBA1C 6.4 (H) 04/06/2021   HGBA1C 7.0 (A) 03/19/2020   HGBA1C 6.5 (A) 08/17/2018   Wt Readings from Last 3 Encounters:  09/02/21 216 lb 9.6 oz (98.2 kg)  05/10/21 235 lb 6.4 oz (106.8 kg)  04/29/21 227 lb 11.2 oz (103.3 kg)   Last seen for diabetes 3 months ago.  Management since then includes none. She reports excellent compliance with treatment. She is not having side effects.  Symptoms: No fatigue No foot ulcerations  Yes appetite changes No nausea  Yes paresthesia of the feet  No polydipsia  No polyuria No visual disturbances   No vomiting     Home blood sugar records: fasting range: 100-147  Episodes of hypoglycemia? No    Current insulin regiment: none Most Recent Eye Exam: >12 months Current exercise: not asked Current diet habits: in general, a "healthy" diet    Pertinent Labs: Lab Results  Component Value Date   CHOL 230 (H) 04/06/2021   HDL 56 04/06/2021   LDLCALC 146 (H) 04/06/2021   TRIG 157 (H) 04/06/2021   CHOLHDL 4.1 04/06/2021   Lab Results  Component Value Date   NA 134 (L) 05/11/2021   K 4.6 05/11/2021   CREATININE 0.66 05/11/2021   GFRNONAA >60 05/11/2021   MICROALBUR Negative 12/02/2019   LABMICR 35.5 04/06/2021     ---------------------------------------------------------------------------------------------------  Hypertension, follow-up  BP Readings from Last 3 Encounters:  09/02/21 (!) 141/66  05/11/21 (!) 135/53  04/29/21 (!) 147/65   Wt Readings from Last 3 Encounters:  09/02/21 216 lb 9.6 oz (98.2 kg)  05/10/21 235 lb 6.4 oz (106.8 kg)  04/29/21 227 lb 11.2 oz  (103.3 kg)     She was last seen for hypertension 3 months ago.  BP at that visit was 142/66. Management since that visit includes recommend to restrict salt, caffeine, and energy drinks.  She reports excellent compliance with treatment. She is not having side effects.  She is following a Regular diet. She is not exercising. She does smoke.  Use of agents associated with hypertension: NSAIDS.   Outside blood pressures are not checked. Symptoms: No chest pain No chest pressure  No palpitations No syncope  No dyspnea No orthopnea  No paroxysmal nocturnal dyspnea No lower extremity edema   Pertinent labs: Lab Results  Component Value Date   CHOL 230 (H) 04/06/2021   HDL 56 04/06/2021   LDLCALC 146 (H) 04/06/2021   TRIG 157 (H) 04/06/2021   CHOLHDL 4.1 04/06/2021   Lab Results  Component Value Date   NA 134 (L) 05/11/2021   K 4.6 05/11/2021   CREATININE 0.66 05/11/2021   GFRNONAA >60 05/11/2021   GLUCOSE 148 (H) 05/11/2021   TSH 4.790 (H) 04/06/2021     The 10-year ASCVD risk score (Arnett DK, et al., 2019) is: 30.5%   ---------------------------------------------------------------------------------------------------  Hypothyroid, follow-up  Lab Results  Component Value Date   TSH 4.790 (H) 04/06/2021   TSH 2.16 05/19/2017   TSH 6.720 (H) 04/25/2016   T4TOTAL 10.3 04/06/2021    Wt Readings from Last 3  Encounters:  09/02/21 216 lb 9.6 oz (98.2 kg)  05/10/21 235 lb 6.4 oz (106.8 kg)  04/29/21 227 lb 11.2 oz (103.3 kg)    She was last seen for hypothyroid 3 months ago.  Management since that visit includes none. She reports excellent compliance with treatment. She is not having side effects.   Symptoms: No change in energy level No constipation  Yes diarrhea Yes heat / cold intolerance  No nervousness No palpitations  No weight changes    -----------------------------------------------------------------------------------------  Medications: Outpatient  Medications Prior to Visit  Medication Sig   acetaminophen (TYLENOL) 500 MG tablet Take 1,000 mg by mouth every 6 (six) hours as needed for mild pain or moderate pain.    albuterol (VENTOLIN HFA) 108 (90 Base) MCG/ACT inhaler INHALE 2 PUFFS INTO THE LUNGS EVERY 4 HOURS AS NEEDED FOR WHEEZING OR SHORTNESS OF BREATH   aspirin 81 MG chewable tablet Chew 1 tablet (81 mg total) by mouth 2 (two) times daily.   benzonatate (TESSALON) 200 MG capsule TAKE 1 CAPSULE BY MOUTH 3 TIMES DAILY ASNEEDED   buPROPion (WELLBUTRIN XL) 150 MG 24 hr tablet Take 1 tablet (150 mg total) by mouth daily.   Cholecalciferol (VITAMIN D) 50 MCG (2000 UT) tablet Take 2,000 Units by mouth daily.   clonazePAM (KLONOPIN) 0.5 MG tablet TAKE 1/2 TABLET BY MOUTH TWICE DAILY AS NEEDED (Patient taking differently: Take 0.5 mg by mouth 2 (two) times daily as needed for anxiety.)   diclofenac sodium (VOLTAREN) 1 % GEL Apply 4 g topically 4 (four) times daily as needed. (Patient taking differently: Apply 4 g topically 4 (four) times daily as needed. Apply to knees)   docusate sodium (COLACE) 100 MG capsule Take 1 capsule (100 mg total) by mouth 2 (two) times daily.   DULoxetine (CYMBALTA) 60 MG capsule TAKE 1 CAPSULE BY MOUTH TWICE DAILY   ergocalciferol (VITAMIN D2) 1.25 MG (50000 UT) capsule Take 1 capsule (50,000 Units total) by mouth once a week. Once a week on Wednesday   ezetimibe (ZETIA) 10 MG tablet TAKE 1 TABLET DAILY   fluconazole (DIFLUCAN) 150 MG tablet Take one pill on day 1. Take second pill 3 days later if still symptomatic.   fluticasone (FLONASE) 50 MCG/ACT nasal spray USE 2 SPRAYS IN EACH NOSTRIL ONCE DAILY   gabapentin (NEURONTIN) 300 MG capsule TAKE 3 CAPSULES BY MOUTH ONCE EVERY MORNING AND 5 CAPSULES AT BEDTIME   glucose blood (CONTOUR NEXT TEST) test strip To check blood sugar once daily   HYDROcodone-acetaminophen (NORCO) 7.5-325 MG tablet Take 1 tablet by mouth every 4 (four) hours as needed for severe pain (pain  score 7-10).   ipratropium-albuterol (DUONEB) 0.5-2.5 (3) MG/3ML SOLN Take 3 mLs by nebulization every 6 (six) hours as needed.   levothyroxine (SYNTHROID) 75 MCG tablet TAKE 1 TABLET BY MOUTH ONCE DAILY ON AN EMPTY STOMACH. WAIT 30 MINUTES BEFORE TAKING OTHER MEDS.   loperamide (IMODIUM A-D) 2 MG tablet Take 2-4 mg by mouth 4 (four) times daily as needed for diarrhea or loose stools.   meloxicam (MOBIC) 15 MG tablet Take 1 tablet (15 mg total) by mouth daily.   metaxalone (SKELAXIN) 800 MG tablet TAKE 1 TABLET BY MOUTH 3 TIMES DAILY FORMUSCLE SPASMS   metFORMIN (GLUCOPHAGE-XR) 500 MG 24 hr tablet TAKE 2 TABLETS BY MOUTH ONCE A DAY WITH SUPPER   methocarbamol (ROBAXIN) 500 MG tablet Take 1 tablet (500 mg total) by mouth every 6 (six) hours as needed for muscle spasms.  metoprolol succinate (TOPROL-XL) 50 MG 24 hr tablet TAKE 1 TABLET BY MOUTH ONCE DAILY. TAKE WITH OR IMMEDIATELY FOLLOWING A MEAL   nystatin (MYCOSTATIN) 100000 UNIT/ML suspension TAKE 1 TEASPOON BY MOUTH 4 TIMES DAILY (Patient taking differently: Use as directed 5 mLs in the mouth or throat 4 (four) times daily as needed (thrush).)   polyethylene glycol (MIRALAX / GLYCOLAX) packet Take 17 g by mouth daily. (Patient taking differently: Take 17 g by mouth daily as needed for moderate constipation.)   primidone (MYSOLINE) 50 MG tablet Take 50 mg by mouth 2 (two) times daily.   Spacer/Aero-Holding Chambers (AEROCHAMBER PLUS WITH MASK) inhaler    THEO-24 300 MG 24 hr capsule TAKE 1 CAPSULE BY MOUTH ONCE DAILY   traMADol (ULTRAM) 50 MG tablet Take 1 tablet (50 mg total) by mouth every 4 (four) hours as needed for moderate pain.   traZODone (DESYREL) 100 MG tablet Take 2 tablets (200 mg total) by mouth at bedtime as needed for sleep.   vitamin B-12 (CYANOCOBALAMIN) 1000 MCG tablet Take 1,000 mcg by mouth daily.   [DISCONTINUED] BREO ELLIPTA 200-25 MCG/INH AEPB INHALE 1 PUFF BY MOUTH ONCE DAILY   No facility-administered medications prior  to visit.    Review of Systems     Objective    BP (!) 141/66    Pulse 71    Temp 98.3 F (36.8 C) (Oral)    Resp 16    Wt 216 lb 9.6 oz (98.2 kg)    SpO2 90%    BMI 30.21 kg/m    Physical Exam Vitals and nursing note reviewed.  Constitutional:      General: She is not in acute distress.    Appearance: Normal appearance. She is obese. She is not ill-appearing, toxic-appearing or diaphoretic.  HENT:     Head: Normocephalic and atraumatic.  Cardiovascular:     Rate and Rhythm: Normal rate and regular rhythm.     Pulses: Normal pulses.     Heart sounds: Normal heart sounds. No murmur heard.   No friction rub. No gallop.  Pulmonary:     Effort: Pulmonary effort is normal. No respiratory distress.     Breath sounds: Normal breath sounds. No stridor. No wheezing, rhonchi or rales.  Chest:     Chest wall: No tenderness.  Abdominal:     General: Bowel sounds are normal.     Palpations: Abdomen is soft.  Musculoskeletal:        General: No swelling, tenderness, deformity or signs of injury. Normal range of motion.     Right lower leg: No edema.     Left lower leg: No edema.  Skin:    General: Skin is warm and dry.     Capillary Refill: Capillary refill takes less than 2 seconds.     Coloration: Skin is not jaundiced or pale.     Findings: No bruising, erythema, lesion or rash.  Neurological:     General: No focal deficit present.     Mental Status: She is alert and oriented to person, place, and time. Mental status is at baseline.     Cranial Nerves: No cranial nerve deficit.     Sensory: No sensory deficit.     Motor: No weakness.     Coordination: Coordination normal.  Psychiatric:        Mood and Affect: Mood is anxious.        Behavior: Behavior normal. Behavior is not agitated.  Thought Content: Thought content normal.        Judgment: Judgment normal.     No results found for any visits on 09/02/21.  Assessment & Plan     Problem List Items Addressed  This Visit       Cardiovascular and Mediastinum   Hypertension associated with type 2 diabetes mellitus (HCC)    Chronic, slight elevation  Reinforce goal 130/80 or less Denies CP Denies SOB Denies DOE No LE Edema noted on exam Continue medication Refills stable Seek emergent care if you develop CP, chest pain or chest pressure       Relevant Orders   Comprehensive metabolic panel     Respiratory   Chronic obstructive pulmonary disease with acute exacerbation (HCC) - Primary    Ongoing complaints Continue to recommend smoking reduction and cessation- pt refusal Start new inhaler get cxr      Relevant Medications   Fluticasone-Umeclidin-Vilant (TRELEGY ELLIPTA) 200-62.5-25 MCG/ACT AEPB   COPD with exacerbation ([place])   Relevant Medications   Fluticasone-Umeclidin-Vilant (TRELEGY ELLIPTA) 200-62.5-25 MCG/ACT AEPB   Other Relevant Orders   DG Chest 2 View (Completed)     Endocrine   Adult hypothyroidism    Denies complaints Check labs today      Relevant Orders   TSH + free T4   Hyperlipidemia associated with type 2 diabetes mellitus (HCC)    LDL goal <70      Relevant Orders   Lipid panel   Type 2 diabetes mellitus with hyperglycemia, without long-term current use of insulin (HCC)    Repeat A1c testing Prev hx of elevated BS/BG levels      Relevant Orders   Hemoglobin A1c     Genitourinary   Vaginal cyst    Reported appr 6 month hx of cyst like mass near opening of vaginal with growth; refused in office exam today Referral placed      Relevant Orders   Ambulatory referral to Gynecology     Other   Anxiety, generalized    Worsening d/t concerns with two children, who have drug and behavioral concerns Requested prn benzo to assist for hard days- advised that it may not be safe given her children's health hx She advised one knows better and she hasn't let the other in in 1 month Trial of atarax to augment at this time Refer back to psych if continue  to press issue given age and high risk for medication to fall into wrong hands given what little pt shared of her children's behaviors       Relevant Medications   hydrOXYzine (ATARAX) 10 MG tablet   Compulsive tobacco user syndrome    30 cigs/day; no desire to quit or reduce Reports cigs and diet coke are her only vices      Relevant Orders   CT CHEST LUNG CA SCREEN LOW DOSE W/O CM     Return in about 3 months (around 11/30/2021) for annual examination.      Vonna Kotyk, FNP, have reviewed all documentation for this visit. The documentation on 09/02/21 for the exam, diagnosis, procedures, and orders are all accurate and complete.  Patient seen and examined by Tally Joe,  FNP note scribed by [employer], [place], [place] [phone] (phone) [phone] (fax)  [place]

## 2021-09-02 NOTE — Assessment & Plan Note (Signed)
30 cigs/day; no desire to quit or reduce Reports cigs and diet coke are her only vices

## 2021-09-02 NOTE — Assessment & Plan Note (Signed)
Worsening d/t concerns with two children, who have drug and behavioral concerns Requested prn benzo to assist for hard days- advised that it may not be safe given her children's health hx She advised one knows better and she hasn't let the other in in 1 month Trial of atarax to augment at this time Refer back to psych if continue to press issue given age and high risk for medication to fall into wrong hands given what little pt shared of her children's behaviors

## 2021-09-02 NOTE — Assessment & Plan Note (Signed)
Reported appr 6 month hx of cyst like mass near opening of vaginal with growth; refused in office exam today Referral placed

## 2021-09-02 NOTE — Assessment & Plan Note (Signed)
LDL goal < 70 

## 2021-09-02 NOTE — Assessment & Plan Note (Signed)
Repeat A1c testing Prev hx of elevated BS/BG levels

## 2021-09-02 NOTE — Assessment & Plan Note (Signed)
Ongoing complaints Continue to recommend smoking reduction and cessation- pt refusal Start new inhaler get cxr

## 2021-09-02 NOTE — Assessment & Plan Note (Signed)
Denies complaints Check labs today

## 2021-09-03 ENCOUNTER — Telehealth: Payer: Self-pay

## 2021-09-03 ENCOUNTER — Telehealth: Payer: Self-pay | Admitting: Acute Care

## 2021-09-03 ENCOUNTER — Other Ambulatory Visit: Payer: Self-pay | Admitting: Family Medicine

## 2021-09-03 ENCOUNTER — Other Ambulatory Visit (HOSPITAL_COMMUNITY)
Admission: RE | Admit: 2021-09-03 | Discharge: 2021-09-03 | Disposition: A | Discharge status: Home / Self Care | Payer: Medicare PPO | Source: Ambulatory Visit | Attending: Obstetrics & Gynecology | Admitting: Obstetrics & Gynecology

## 2021-09-03 ENCOUNTER — Encounter: Payer: Self-pay | Admitting: Obstetrics & Gynecology

## 2021-09-03 ENCOUNTER — Ambulatory Visit: Payer: Medicare PPO | Admitting: Obstetrics & Gynecology

## 2021-09-03 VITALS — BP 102/60 | Ht 71.0 in | Wt 218.0 lb

## 2021-09-03 DIAGNOSIS — N9089 Other specified noninflammatory disorders of vulva and perineum: Secondary | ICD-10-CM

## 2021-09-03 DIAGNOSIS — L292 Pruritus vulvae: Secondary | ICD-10-CM

## 2021-09-03 DIAGNOSIS — E039 Hypothyroidism, unspecified: Secondary | ICD-10-CM

## 2021-09-03 LAB — LIPID PANEL
Chol/HDL Ratio: 4.9 ratio — ABNORMAL HIGH (ref 0.0–4.4)
Cholesterol, Total: 269 mg/dL — ABNORMAL HIGH (ref 100–199)
HDL: 55 mg/dL (ref >39)
LDL Chol Calc (NIH): 170 mg/dL — ABNORMAL HIGH (ref 0–99)
Triglycerides: 236 mg/dL — ABNORMAL HIGH (ref 0–149)
VLDL Cholesterol Cal: 44 mg/dL — ABNORMAL HIGH (ref 5–40)

## 2021-09-03 LAB — TSH+FREE T4
Free T4: 1.17 ng/dL (ref 0.82–1.77)
TSH: 2.62 u[IU]/mL (ref 0.450–4.500)

## 2021-09-03 LAB — COMPREHENSIVE METABOLIC PANEL
ALT: 12 IU/L (ref 0–32)
AST: 18 IU/L (ref 0–40)
Albumin/Globulin Ratio: 1.8 (ref 1.2–2.2)
Albumin: 4.4 g/dL (ref 3.8–4.8)
Alkaline Phosphatase: 107 IU/L (ref 44–121)
BUN/Creatinine Ratio: 21 (ref 12–28)
BUN: 14 mg/dL (ref 8–27)
Bilirubin Total: <0.2 mg/dL (ref 0.0–1.2)
CO2: 25 mmol/L (ref 20–29)
Calcium: 9.6 mg/dL (ref 8.7–10.3)
Chloride: 100 mmol/L (ref 96–106)
Creatinine, Ser: 0.67 mg/dL (ref 0.57–1.00)
Globulin, Total: 2.4 g/dL (ref 1.5–4.5)
Glucose: 97 mg/dL (ref 70–99)
Potassium: 5.5 mmol/L — ABNORMAL HIGH (ref 3.5–5.2)
Sodium: 139 mmol/L (ref 134–144)
Total Protein: 6.8 g/dL (ref 6.0–8.5)
eGFR: 97 mL/min/{1.73_m2} (ref >59)

## 2021-09-03 LAB — HEMOGLOBIN A1C
Est. average glucose Bld gHb Est-mCnc: 148 mg/dL
Hgb A1c MFr Bld: 6.8 % — ABNORMAL HIGH (ref 4.8–5.6)

## 2021-09-03 MED ORDER — LEVOTHYROXINE SODIUM 75 MCG PO TABS: 75.0000 ug | ORAL_TABLET | Freq: Every day | ORAL | 3 refills | Status: DC

## 2021-09-03 NOTE — Telephone Encounter (Signed)
Called patient to schedule LDCT.  She was driving and phone connection very poor.  Informed patient will call her back next week. Patient agrees.

## 2021-09-03 NOTE — Patient Instructions (Signed)
Vulva Biopsy, Care After This sheet gives you information about how to care for yourself after your procedure. Your doctor may also give you more specific instructions. If you have problems or questions, contact your doctor. What can I expect after the procedure? After the procedure, it is common to have: Slight bleeding from the biopsy site. Soreness at the biopsy site. Follow these instructions at home: Biopsy site care  Follow instructions from your doctor about how to take care of your biopsy site. Make sure you: Clean the area using water and mild soap two times a day or as told by your doctor. Gently pat the area dry. If you were prescribed an antibiotic ointment, apply it as told by your doctor. Do not stop using the antibiotic even if your condition gets better. Take a warm water bath (sitz bath) as needed to help with pain. A sitz bath is taken while you are sitting down. The water should only come up to your hips and cover your butt. Leave stitches (sutures), skin glue, or skin tape (adhesive) strips in place. They may need to stay in place for 2 weeks or longer. If tape strips get loose and curl up, you may trim the loose edges. Do not remove tape strips completely unless your doctor says it is okay. Check your biopsy area every day for signs of infection. Check for: More redness, swelling, or pain. More fluid or blood. Warmth. Pus or a bad smell. Do not rub the biopsy area after peeing (urinating). Gently pat the area dry, or use a bottle filled with warm water (peri-bottle) to clean the area. Gently wipe from front to back. Lifestyle Wear loose, cotton underwear. Do not wear tight pants. Do not use a tampon, douche, or put anything in your vagina for at least 1 week or until your doctor says it is okay. Do not have sex for at least 1 week or until your doctor says it is okay. Do not exercise until your doctor says it is okay. Do not swim or use a hot tub until your doctor  says it is okay. You may shower or take a sitz bath. General instructions Take over-the-counter and prescription medicines only as told by your doctor. Use a sanitary pad until the bleeding stops. Keep all follow-up visits as told by your doctor. This is important. Contact a doctor if: You have more redness, swelling, or pain around your biopsy site. You have more fluid or blood coming from your biopsy site. Your biopsy site feels warm when you touch it. Medicines do not help with your pain. Get help right away if: You have a lot of bleeding from the vulva. You have pus or a bad smell coming from your biopsy site. You have a fever. You have pain in the lower belly (abdomen). Summary After the procedure, it is common to have slight bleeding and soreness at the biopsy site. Follow all instructions as told by your doctor. Clean the area with water and mild soap. Do not rub. Pat the area dry. Take sitz baths as needed. Leave any stitches in place. Check your biopsy site for infection. Signs include more redness, swelling, pain, fluid, or blood, or feeling warm when you touch it. Get help right away if you have a lot of bleeding, a fever, pus or a bad smell, or pain in your lower belly. This information is not intended to replace advice given to you by your health care provider. Make sure you discuss any  questions you have with your health care provider. Document Revised: 01/17/2018 Document Reviewed: 01/18/2018 Elsevier Patient Education  2022 [employer].

## 2021-09-03 NOTE — Telephone Encounter (Signed)
Patient is scheduled for 09/03/21 with [facility]

## 2021-09-03 NOTE — Telephone Encounter (Signed)
[facility] urgent referring for Vaginal cyst. Report vaginal growth on onside, with in 6 months. Called and left voicemail for patient to call back to be scheduled.

## 2021-09-03 NOTE — Progress Notes (Signed)
HPI:      [name] is a 66 y.o. G2P2002, No LMP recorded. Patient is postmenopausal., presents today for a problem visit.  She complains of:  Vulvar concern:   This is a 66 y.o. old Caucasian/White female who presents for the evaluation of vulvar lesion(s). She describes the vulvar lesion(s) as asymmetrical, elevated, hard, irregular surface, and assoicated with itching; she has scratched to the point of it flattening somewhat. She indicates that she has noticed 1 lesions that the average size is approximately 65mm to 47mm.  She indicates she first noticed the problem six months ago. She admits to symptoms of itching.  The following aggravating factors are identified: none. The following alleviating factors are identified: none.  Shehas had no previous colposcopy for this condition. The lesion has had not been biopsied. She has had no previous treatment for this condition.   Prior NSVD x2.  Prior BTL.  N other gyn surgery or concerns. Reports occas GSI.  PMHx: She  has a past medical history of Anxiety, Arthritis, Cancer ([place]), COPD (chronic obstructive pulmonary disease) ([place]), Depression, Diabetes mellitus without complication ([place]), Dysrhythmia, Fatigue, GERD (gastroesophageal reflux disease), Headache, Hypertension, Hypothyroidism, Thyroid disease, and Tremors of nervous system. Also,  has a past surgical history that includes Tubal ligation; Appendectomy; percutaneous drainage tube (07/2018); Laparoscopic sigmoid colectomy (N/A, 09/03/2018); Colectomy with colostomy creation/hartmann procedure (N/A, 09/03/2018); Cystoscopy with stent placement (Bilateral, 09/03/2018); Colonoscopy with propofol (N/A, 03/08/2019); Tonsillectomy; Cholecystectomy (N/A, 07/01/2019); and Total knee arthroplasty (Left, 05/10/2021)., family history includes Alcohol abuse in her brother, maternal grandmother, maternal uncle, sister, and son; Arthritis in her sister; Breast cancer in her brother; Diabetes in her maternal aunt,  maternal grandmother, and sister; Drug abuse in her son and son; Heart attack in her brother; Heart disease (age of onset: 36) in her father; Heart disease (age of onset: 60) in her brother; Lung cancer in her mother; Pancreatic cancer in her maternal aunt.,  reports that she has been smoking cigarettes. She started smoking about 41 years ago. She has a 67.50 pack-year smoking history. She has never used smokeless tobacco. She reports that she does not drink alcohol and does not use drugs.  She has a current medication list which includes the following prescription(s): acetaminophen, albuterol, aspirin, benzonatate, bupropion, vitamin d, docusate sodium, duloxetine, ergocalciferol, ezetimibe, fluticasone, trelegy ellipta, gabapentin, glucose blood, hydrocodone-acetaminophen, hydroxyzine, ipratropium-albuterol, levothyroxine, loperamide, meloxicam, metaxalone, metformin, methocarbamol, metoprolol succinate, primidone, aerochamber plus with mask, theo-24, tramadol, trazodone, and vitamin b-12. Also, is allergic to dexilant [dexlansoprazole].  Review of Systems  Constitutional:  Negative for chills, fever and malaise/fatigue.  HENT:  Negative for congestion, sinus pain and sore throat.   Eyes:  Negative for blurred vision and pain.  Respiratory:  Negative for cough and wheezing.   Cardiovascular:  Negative for chest pain and leg swelling.  Gastrointestinal:  Negative for abdominal pain, constipation, diarrhea, heartburn, nausea and vomiting.  Genitourinary:  Negative for dysuria, frequency, hematuria and urgency.  Musculoskeletal:  Negative for back pain, joint pain, myalgias and neck pain.  Skin:  Negative for itching and rash.  Neurological:  Negative for dizziness, tremors and weakness.  Endo/Heme/Allergies:  Does not bruise/bleed easily.  Psychiatric/Behavioral:  Negative for depression. The patient is not nervous/anxious and does not have insomnia.    Objective: BP 102/60    Ht 5\' 11"  (1.803 m)     Wt 218 lb (98.9 kg)    BMI 30.40 kg/m  Physical Exam Constitutional:  General: She is not in acute distress.    Appearance: She is well-developed.  Genitourinary:     Genitourinary Comments: One flat irreg firm lesion [place] at first appearance; although biopsy suggests otherwise     Right Labia: No rash or tenderness.    Left Labia: No tenderness or rash.       No vaginal erythema or bleeding.      Right Adnexa: not tender and no mass present.    Left Adnexa: not tender and no mass present.    No cervical motion tenderness, discharge, polyp or nabothian cyst.     Uterus is not enlarged.     No uterine mass detected.    Pelvic exam was performed with patient in the lithotomy position.  HENT:     Head: Normocephalic and atraumatic.     Nose: Nose normal.  Abdominal:     General: There is no distension.     Palpations: Abdomen is soft.     Tenderness: There is no abdominal tenderness.  Musculoskeletal:        General: Normal range of motion.  Neurological:     Mental Status: She is alert and oriented to person, place, and time.     Cranial Nerves: No cranial nerve deficit.  Skin:    General: Skin is warm and dry.  Psychiatric:        Attention and Perception: Attention normal.        Mood and Affect: Mood and affect normal.        Speech: Speech normal.        Behavior: Behavior normal.        Thought Content: Thought content normal.        Judgment: Judgment normal.    ASSESSMENT/PLAN:    Problem List Items Addressed This Visit     Vulvar lesion    -  Primary   Relevant Orders   Surgical pathology   VULVAR BIOPSY NOTE The indications for vulvar biopsy (rule out neoplasia, establish lichen sclerosus diagnosis) were reviewed.   Risks of the biopsy including pain, bleeding, infection, inadequate specimen, scarring and need for additional procedures  were discussed. The patient stated understanding and agreed to undergo procedure today. Consent was signed,  time  out performed.   The patient's vulva was prepped with Betadine. 1% lidocaine was injected into area of concern. A 3 -mm punch biopsy was done, biopsy tissue was picked up with sterile forceps and sterile scissors were used to excise the lesion.  Small bleeding was noted and hemostasis was achieved using silver nitrate sticks.  The patient tolerated the procedure well. Post-procedure instructions  (pelvic rest for one week) were given to the patient. The patient is to call with heavy bleeding, fever greater than 100.4, foul smelling vaginal discharge or other concerns.       Barnett Applebaum, MD, [facility], [facility] 09/03/2021  3:58 PM

## 2021-09-06 ENCOUNTER — Other Ambulatory Visit: Payer: Self-pay

## 2021-09-06 ENCOUNTER — Telehealth: Payer: Self-pay

## 2021-09-06 DIAGNOSIS — I152 Hypertension secondary to endocrine disorders: Secondary | ICD-10-CM

## 2021-09-06 DIAGNOSIS — F17210 Nicotine dependence, cigarettes, uncomplicated: Secondary | ICD-10-CM

## 2021-09-06 DIAGNOSIS — E1159 Type 2 diabetes mellitus with other circulatory complications: Secondary | ICD-10-CM

## 2021-09-06 DIAGNOSIS — Z87891 Personal history of nicotine dependence: Secondary | ICD-10-CM

## 2021-09-06 LAB — HM DIABETES EYE EXAM

## 2021-09-06 NOTE — Telephone Encounter (Signed)
-----   Message from Minette Headland, [place] sent at 09/03/2021  3:26 PM EST ----- Lmtcb okay for [facility] nurse triage to advise. KW

## 2021-09-06 NOTE — Telephone Encounter (Signed)
LDCT has been scheduled

## 2021-09-07 LAB — SURGICAL PATHOLOGY

## 2021-09-08 ENCOUNTER — Other Ambulatory Visit: Payer: Self-pay | Admitting: Obstetrics & Gynecology

## 2021-09-08 NOTE — Progress Notes (Signed)
Vulvar Biopsy results (benign) d/w pt Monitor for healing Unlikely to have recurrence or chronic sxs Return PRN  Barnett Applebaum, MD, [place], [facility] 09/08/2021  8:17 AM

## 2021-09-16 ENCOUNTER — Other Ambulatory Visit: Payer: Self-pay

## 2021-09-16 ENCOUNTER — Ambulatory Visit
Admission: RE | Admit: 2021-09-16 | Discharge: 2021-09-16 | Disposition: A | Discharge status: Home / Self Care | Payer: Medicare PPO | Source: Ambulatory Visit | Attending: Acute Care | Admitting: Acute Care

## 2021-09-16 DIAGNOSIS — F17210 Nicotine dependence, cigarettes, uncomplicated: Secondary | ICD-10-CM | POA: Diagnosis not present

## 2021-09-16 DIAGNOSIS — Z87891 Personal history of nicotine dependence: Secondary | ICD-10-CM | POA: Diagnosis present

## 2021-09-19 ENCOUNTER — Other Ambulatory Visit: Payer: Self-pay | Admitting: Family Medicine

## 2021-09-19 DIAGNOSIS — G629 Polyneuropathy, unspecified: Secondary | ICD-10-CM

## 2021-09-20 ENCOUNTER — Telehealth: Payer: Self-pay | Admitting: Acute Care

## 2021-09-20 ENCOUNTER — Other Ambulatory Visit: Payer: Self-pay | Admitting: Family Medicine

## 2021-09-20 ENCOUNTER — Encounter: Payer: Self-pay | Admitting: Family Medicine

## 2021-09-20 DIAGNOSIS — R911 Solitary pulmonary nodule: Secondary | ICD-10-CM

## 2021-09-20 DIAGNOSIS — Z87891 Personal history of nicotine dependence: Secondary | ICD-10-CM

## 2021-09-20 NOTE — Addendum Note (Signed)
Addended by: Joella Prince on: 09/20/2021 02:52 PM   Modules accepted: Orders

## 2021-09-20 NOTE — Telephone Encounter (Signed)
I have called the patient with the results of her low dose CT Chest. I explained that her scan was read as a Lung RADS 4 A : suspicious findings, either short term follow up in 3 months or alternatively  PET Scan evaluation may be considered when there is a solid component of  8 mm or larger. There is a new area of a cluster of small nodules in the medial left lower lobe measuring up to 7.3 mm. While likely infectious/inflammatory short-term interval follow-up lung cancer screening study warranted.  She states she was sick at the beginning of January. She is in agreement with a 3 month follow up scan in May.   Langley Gauss, please place order for 3 month follow up scan , and fax results to PCP.   Pt. Is going to talk with her PCP about statin therapy as there was notation of some CAD.

## 2021-09-20 NOTE — Telephone Encounter (Signed)
Noted  

## 2021-09-20 NOTE — Telephone Encounter (Signed)
Call report from [facility] at [facility] Radiology CLINICAL DATA:  66 year old female with 41 pack-year history of smoking. Lung cancer screening.   EXAM: CT CHEST WITHOUT CONTRAST LOW-DOSE FOR LUNG CANCER SCREENING   TECHNIQUE: Multidetector CT imaging of the chest was performed following the standard protocol without IV contrast.   RADIATION DOSE REDUCTION: This exam was performed according to the departmental dose-optimization program which includes automated exposure control, adjustment of the mA and/or kV according to patient size and/or use of iterative reconstruction technique.   COMPARISON:  03/04/2020   FINDINGS: Cardiovascular: The heart size is normal. No substantial pericardial effusion. Coronary artery calcification is evident. Mild atherosclerotic calcification is noted in the wall of the thoracic aorta.   Mediastinum/Nodes: No mediastinal lymphadenopathy. No evidence for gross hilar lymphadenopathy although assessment is limited by the lack of intravenous contrast on the current study. The esophagus has normal imaging features. There is no axillary lymphadenopathy.   Lungs/Pleura: Centrilobular and paraseptal emphysema evident. Biapical pleuroparenchymal scarring noted. Scattered tiny bilateral pulmonary nodules identified previously are stable in the interval interval development of a new cluster of small nodules in the medial left lower lobe (see image 225) measuring up to 7.3 mm. Atelectasis noted in the lung bases.   Upper Abdomen: Unremarkable   Musculoskeletal: No worrisome lytic or sclerotic osseous abnormality.   IMPRESSION: Lung-RADS 4A, suspicious. Follow up low-dose chest CT without contrast in 3 months (please use the following order, "CT CHEST LCS NODULE FOLLOW-UP W/O CM") is recommended. Alternatively, PET may be considered when there is a solid component 73mm or larger. This designation due to interval development of a new cluster of  small nodules in the medial left lower lobe measuring up to 7.3 mm. While likely infectious/inflammatory short-term interval follow-up lung cancer screening study warranted.   Aortic Atherosclerosis (ICD10-I70.0) and Emphysema (ICD10-J43.9).   These results will be called to the ordering clinician or representative by the Radiologist Assistant, and communication documented in the PACS or [employer].     Electronically Signed   By: Misty Stanley M.D.   On: 09/18/2021 13:57   Sarah please advise

## 2021-09-27 ENCOUNTER — Encounter: Payer: Self-pay | Admitting: Family Medicine

## 2021-09-27 ENCOUNTER — Ambulatory Visit: Payer: Self-pay | Admitting: *Deleted

## 2021-09-27 ENCOUNTER — Other Ambulatory Visit: Payer: Self-pay

## 2021-09-27 ENCOUNTER — Ambulatory Visit: Payer: Medicare PPO | Admitting: Family Medicine

## 2021-09-27 VITALS — BP 117/58 | HR 69 | Temp 98.1°F | Resp 16 | Wt 213.0 lb

## 2021-09-27 DIAGNOSIS — R0789 Other chest pain: Secondary | ICD-10-CM | POA: Diagnosis not present

## 2021-09-27 DIAGNOSIS — R011 Cardiac murmur, unspecified: Secondary | ICD-10-CM | POA: Diagnosis not present

## 2021-09-27 DIAGNOSIS — R0609 Other forms of dyspnea: Secondary | ICD-10-CM | POA: Diagnosis not present

## 2021-09-27 NOTE — Telephone Encounter (Signed)
°  Chief Complaint: SOB Symptoms; Frequency:  Pertinent Negatives: Patient denies  Disposition: [] ED /[] Urgent Care (no appt availability in office) / [] Appointment(In office/virtual)/ []  [place] [facility]/ [] Home Care/ [] Refused Recommended Disposition /[] [facility] Mobile Bus/ []  Follow-up with PCP Additional Notes: Patient called earlier this morning and could not get through to nurse triage- system issues. On call back- patient has driven to office and is going in to get appointment.Triage stopped - patient is at office

## 2021-09-27 NOTE — Telephone Encounter (Signed)
Shortness of breath for the past 3-4 days, worsened over night last night. Please advise.  Answer Assessment - Initial Assessment Questions 1. RESPIRATORY STATUS: "Describe your breathing?" (e.g., wheezing, shortness of breath, unable to speak, severe coughing)      EMS came last night- all was normal- patient states she had EKG and they checked her out. Patient states she is at the office now- she is going in to see if they can see her.  2. ONSET: "When did this breathing problem begin?"      4 days 3. PATTERN "Does the difficult breathing come and go, or has it been constant since it started?"      *No Answer* 4. SEVERITY: "How bad is your breathing?" (e.g., mild, moderate, severe)    - MILD: No SOB at rest, mild SOB with walking, speaks normally in sentences, can lie down, no retractions, pulse < 100.    - MODERATE: SOB at rest, SOB with minimal exertion and prefers to sit, cannot lie down flat, speaks in phrases, mild retractions, audible wheezing, pulse 100-120.    - SEVERE: Very SOB at rest, speaks in single words, struggling to breathe, sitting hunched forward, retractions, pulse > 120      *No Answer* 5. RECURRENT SYMPTOM: "Have you had difficulty breathing before?" If Yes, ask: "When was the last time?" and "What happened that time?"      *No Answer* 6. CARDIAC HISTORY: "Do you have any history of heart disease?" (e.g., heart attack, angina, bypass surgery, angioplasty)      *No Answer* 7. LUNG HISTORY: "Do you have any history of lung disease?"  (e.g., pulmonary embolus, asthma, emphysema)     *No Answer* 8. CAUSE: "What do you think is causing the breathing problem?"      *No Answer* 9. OTHER SYMPTOMS: "Do you have any other symptoms? (e.g., dizziness, runny nose, cough, chest pain, fever)     *No Answer* 10. O2 SATURATION MONITOR:  "Do you use an oxygen saturation monitor (pulse oximeter) at home?" If Yes, "What is your reading (oxygen level) today?" "What is your usual oxygen  saturation reading?" (e.g., 95%)       *No Answer* 11. PREGNANCY: "Is there any chance you are pregnant?" "When was your last menstrual period?"       *No Answer* 12. TRAVEL: "Have you traveled out of the country in the last month?" (e.g., travel history, exposures)       *No Answer*  Protocols used: Breathing Difficulty-A-AH

## 2021-09-27 NOTE — Progress Notes (Addendum)
I,[name],acting as a scribe for Lelon Huh, MD.,have documented all relevant documentation on the behalf of Lelon Huh, MD,as directed by  Lelon Huh, MD while in the presence of Lelon Huh, MD.   Established patient visit   Patient: Linda Bradford   DOB: 03-26-1956   66 y.o. Female  MRN: [id number] Visit Date: 09/27/2021  Today's healthcare provider: Lelon Huh, MD   Chief Complaint  Patient presents with   Shortness of Breath   Subjective    Shortness of Breath This is a new problem. Episode onset: 4-5 days ago. The problem has been gradually worsening. Associated symptoms include headaches and wheezing. Pertinent negatives include no abdominal pain, chest pain, fever, leg swelling or vomiting. The symptoms are aggravated by exercise (or exertion). Treatments tried: prescription inhalers. The treatment provided mild relief. Her past medical history is significant for COPD.  She had chest CT for lung nodule follow up last week with no acute finding, but there was a suspicious nodule with 3 month follow up recommended.   Patient says EMS was called out to her home yesterday and EKG was performed. Was very active yesterday was doing very well until she sat down to rest and became worse. Albuterol didn't help. She had very brief episode of chest pressure that resolved after a few seconds. She says it showed sinus rhythm. Patient was advised to follow up with her PCP.   Medications: Outpatient Medications Prior to Visit  Medication Sig   acetaminophen (TYLENOL) 500 MG tablet Take 1,000 mg by mouth every 6 (six) hours as needed for mild pain or moderate pain.    albuterol (VENTOLIN HFA) 108 (90 Base) MCG/ACT inhaler INHALE 2 PUFFS INTO THE LUNGS EVERY 4 HOURS AS NEEDED FOR WHEEZING OR SHORTNESS OF BREATH   aspirin 81 MG chewable tablet Chew 1 tablet (81 mg total) by mouth 2 (two) times daily.   benzonatate (TESSALON) 200 MG capsule TAKE 1 CAPSULE BY MOUTH 3 TIMES  DAILY ASNEEDED   buPROPion (WELLBUTRIN XL) 150 MG 24 hr tablet Take 1 tablet (150 mg total) by mouth daily.   Cholecalciferol (VITAMIN D) 50 MCG (2000 UT) tablet Take 2,000 Units by mouth daily.   docusate sodium (COLACE) 100 MG capsule Take 1 capsule (100 mg total) by mouth 2 (two) times daily.   DULoxetine (CYMBALTA) 60 MG capsule TAKE 1 CAPSULE BY MOUTH TWICE DAILY   ergocalciferol (VITAMIN D2) 1.25 MG (50000 UT) capsule Take 1 capsule (50,000 Units total) by mouth once a week. Once a week on Wednesday   ezetimibe (ZETIA) 10 MG tablet TAKE 1 TABLET DAILY   fluticasone (FLONASE) 50 MCG/ACT nasal spray USE 2 SPRAYS IN EACH NOSTRIL ONCE DAILY   Fluticasone-Umeclidin-Vilant (TRELEGY ELLIPTA) 200-62.5-25 MCG/ACT AEPB Inhale 1 puff into the lungs 2 (two) times daily.   gabapentin (NEURONTIN) 300 MG capsule TAKE 3 CAPSULES BY MOUTH ONCE EVERY MORNING AND 5 CAPSULES AT BEDTIME   glucose blood (CONTOUR NEXT TEST) test strip To check blood sugar once daily   HYDROcodone-acetaminophen (NORCO) 7.5-325 MG tablet Take 1 tablet by mouth every 4 (four) hours as needed for severe pain (pain score 7-10).   hydrOXYzine (ATARAX) 10 MG tablet Take 1 tablet (10 mg total) by mouth 3 (three) times daily as needed.   ipratropium-albuterol (DUONEB) 0.5-2.5 (3) MG/3ML SOLN Take 3 mLs by nebulization every 6 (six) hours as needed.   levothyroxine (SYNTHROID) 75 MCG tablet Take 1 tablet (75 mcg total) by mouth daily  before breakfast.   loperamide (IMODIUM A-D) 2 MG tablet Take 2-4 mg by mouth 4 (four) times daily as needed for diarrhea or loose stools.   meloxicam (MOBIC) 15 MG tablet Take 1 tablet (15 mg total) by mouth daily.   metaxalone (SKELAXIN) 800 MG tablet TAKE 1 TABLET BY MOUTH 3 TIMES DAILY FORMUSCLE SPASMS   metFORMIN (GLUCOPHAGE-XR) 500 MG 24 hr tablet TAKE 2 TABLETS BY MOUTH ONCE A DAY WITH SUPPER   methocarbamol (ROBAXIN) 500 MG tablet Take 1 tablet (500 mg total) by mouth every 6 (six) hours as needed for  muscle spasms.   metoprolol succinate (TOPROL-XL) 50 MG 24 hr tablet TAKE 1 TABLET BY MOUTH ONCE DAILY. TAKE WITH OR IMMEDIATELY FOLLOWING A MEAL   primidone (MYSOLINE) 50 MG tablet Take 50 mg by mouth 2 (two) times daily.   Spacer/Aero-Holding Chambers (AEROCHAMBER PLUS WITH MASK) inhaler    THEO-24 300 MG 24 hr capsule TAKE 1 CAPSULE BY MOUTH ONCE DAILY   traMADol (ULTRAM) 50 MG tablet Take 1 tablet (50 mg total) by mouth every 4 (four) hours as needed for moderate pain.   traZODone (DESYREL) 100 MG tablet Take 2 tablets (200 mg total) by mouth at bedtime as needed for sleep.   vitamin B-12 (CYANOCOBALAMIN) 1000 MCG tablet Take 1,000 mcg by mouth daily.   No facility-administered medications prior to visit.    Review of Systems  Constitutional:  Negative for appetite change, chills, fatigue and fever.  Respiratory:  Positive for cough (chronic cough), shortness of breath and wheezing. Negative for chest tightness.   Cardiovascular:  Negative for chest pain, palpitations and leg swelling.       Heaviness in chest last night  Gastrointestinal:  Negative for abdominal pain, nausea and vomiting.  Musculoskeletal:  Negative for joint swelling.  Neurological:  Positive for dizziness and headaches. Negative for weakness.      Objective    BP (!) 117/58 (BP Location: Right Arm, Patient Position: Sitting, Cuff Size: Large)    Pulse 69    Temp 98.1 F (36.7 C) (Oral)    Resp 16    Wt 213 lb (96.6 kg)    SpO2 96% Comment: room air   BMI 29.71 kg/m    Physical Exam   General: Appearance:     Well developed, well nourished female in no acute distress  Eyes:    PERRL, conjunctiva/corneas clear, EOM's intact       Lungs:     Clear to auscultation bilaterally, respirations unlabored  Heart:    Normal heart rate. Normal rhythm. III/VI systolic mumur RUSB  MS:   All extremities are intact, trace bipedal edema.   Neurologic:   Awake, alert, oriented x 3. No apparent focal neurological defect.           Assessment & Plan     1. Dyspnea on exertion  - CBC - Brain natriuretic peptide - Troponin T  2. Chest pressure No active sx during office visit. Check labs as above.  Recently had LDCT lung screening remarkable for changes of emphysema and biapical pleuroparanchymal scarring. Continue current inhalers. Consider referral to pulmonary.   3. Systolic murmur Last echo in 2018 revealed LVH, diastolic dysfunction and mild AR. Consider repeat echo.       The entirety of the information documented in the History of Present Illness, Review of Systems and Physical Exam were personally obtained by me. Portions of this information were initially documented by the [place] and reviewed by me  for thoroughness and accuracy.     Lelon Huh, MD  [facility] [phone] (phone) [phone] (fax)  [place]

## 2021-09-28 LAB — CBC
Hematocrit: 44.2 % (ref 34.0–46.6)
Hemoglobin: 14.8 g/dL (ref 11.1–15.9)
MCH: 31 pg (ref 26.6–33.0)
MCHC: 33.5 g/dL (ref 31.5–35.7)
MCV: 93 fL (ref 79–97)
Platelets: 300 10*3/uL (ref 150–450)
RBC: 4.78 x10E6/uL (ref 3.77–5.28)
RDW: 14.3 % (ref 11.7–15.4)
WBC: 7.2 10*3/uL (ref 3.4–10.8)

## 2021-09-28 LAB — BRAIN NATRIURETIC PEPTIDE: BNP: 10.2 pg/mL (ref 0.0–100.0)

## 2021-09-28 LAB — TROPONIN T: Troponin T (Highly Sensitive): 10 ng/L (ref 0–14)

## 2021-09-30 ENCOUNTER — Ambulatory Visit: Payer: Self-pay | Admitting: *Deleted

## 2021-09-30 ENCOUNTER — Other Ambulatory Visit: Payer: Self-pay | Admitting: Family Medicine

## 2021-09-30 ENCOUNTER — Telehealth: Payer: Self-pay | Admitting: Family Medicine

## 2021-09-30 DIAGNOSIS — G250 Essential tremor: Secondary | ICD-10-CM

## 2021-09-30 DIAGNOSIS — E119 Type 2 diabetes mellitus without complications: Secondary | ICD-10-CM

## 2021-09-30 DIAGNOSIS — I10 Essential (primary) hypertension: Secondary | ICD-10-CM

## 2021-09-30 MED ORDER — METOPROLOL SUCCINATE ER 50 MG PO TB24: ORAL_TABLET | ORAL | 1 refills | Status: DC

## 2021-09-30 NOTE — Telephone Encounter (Signed)
Requested Prescriptions  ?Pending Prescriptions Disp Refills  ?? metFORMIN (GLUCOPHAGE-XR) 500 MG 24 hr tablet [Pharmacy Med Name: METFORMIN HCL ER 500 MG TAB] 180 tablet 0  ?  Sig: TAKE 2 TABLETS BY MOUTH ONCE A DAY WITH SUPPER  ?  ? Endocrinology:  Diabetes - Biguanides Failed - 09/30/2021 10:40 AM  ?  ?  Failed - B12 Level in normal range and within 720 days  ?  Vitamin B-12  ?Date Value Ref Range Status  ?04/25/2016 444 211 - 946 pg/mL Final  ?   ?  ?  Passed - Cr in normal range and within 360 days  ?  Creat  ?Date Value Ref Range Status  ?05/19/2017 0.58 0.50 - 0.99 mg/dL Final  ?  Comment:  ?  For patients >43 years of age, the reference limit ?for Creatinine is approximately 13% higher for people ?identified as African-American. ?. ?  ? ?Creatinine, Ser  ?Date Value Ref Range Status  ?09/02/2021 0.67 0.57 - 1.00 mg/dL Final  ?   ?  ?  Passed - HBA1C is between 0 and 7.9 and within 180 days  ?  Hgb A1c MFr Bld  ?Date Value Ref Range Status  ?09/02/2021 6.8 (H) 4.8 - 5.6 % Final  ?  Comment:  ?           Prediabetes: 5.7 - 6.4 ?         Diabetes: >6.4 ?         Glycemic control for adults with diabetes: <7.0 ?  ?   ?  ?  Passed - eGFR in normal range and within 360 days  ?  GFR, Est African American  ?Date Value Ref Range Status  ?05/19/2017 115 > OR = 60 mL/min/1.13m Final  ? ?GFR calc Af Amer  ?Date Value Ref Range Status  ?07/06/2019 >60 >60 mL/min Final  ? ?GFR, Est Non African American  ?Date Value Ref Range Status  ?05/19/2017 99 > OR = 60 mL/min/1.723mFinal  ? ?GFR, Estimated  ?Date Value Ref Range Status  ?05/11/2021 >60 >60 mL/min Final  ?  Comment:  ?  (NOTE) ?Calculated using the CKD-EPI Creatinine Equation (2021) ?  ? ?eGFR  ?Date Value Ref Range Status  ?09/02/2021 97 >59 mL/min/1.73 Final  ?   ?  ?  Passed - Valid encounter within last 6 months  ?  Recent Outpatient Visits   ?      ? 3 days ago Dysp[facility] on exertion  ? [facility]irstie Peri[facility]  ? 4 weeks ago Chronic  obstructive pulmonary disease with acute exacerbation ([facility] ? [facility], FNP  ? 6 months ago [facility] ? [facility]  ? 1 year ago Strain of lumbar region, initial encounter  ? [place]? 1 year ago Tremor of both hands  ? [place]?  ?  ?Future Appointments   ?        ? In 4 days PaGwyneth SproutF[facility]  ?  ? ?  ?  ?  Passed - CBC within normal limits and completed in the last 12 months  ?  WBC  ?Date Value Ref Range Status  ?09/27/2021 7.2 3.4 - 10.8 x10E3/uL Final  ?05/11/2021 10.5 4.0 - 10.5 K/uL Final  ? ?RBC  ?Date  Value Ref Range Status  ?09/27/2021 4.78 3.77 - 5.28 x10E6/uL Final  ?05/11/2021 3.71 (L) 3.87 - 5.11 MIL/uL Final  ? ?Hemoglobin  ?Date Value Ref Range Status  ?09/27/2021 14.8 11.1 - 15.9 g/dL Final  ? ?Hematocrit  ?Date Value Ref Range Status  ?09/27/2021 44.2 34.0 - 46.6 % Final  ? ?MCHC  ?Date Value Ref Range Status  ?09/27/2021 33.5 31.5 - 35.7 g/dL Final  ?05/11/2021 34.1 30.0 - 36.0 g/dL Final  ? ?MCH  ?Date Value Ref Range Status  ?09/27/2021 31.0 26.6 - 33.0 pg Final  ?05/11/2021 32.6 26.0 - 34.0 pg Final  ? ?MCV  ?Date Value Ref Range Status  ?09/27/2021 93 79 - 97 fL Final  ? ?No results found for: PLTCOUNTKUC, LABPLAT, [place] ?RDW  ?Date Value Ref Range Status  ?09/27/2021 14.3 11.7 - 15.4 % Final  ? ?  ?  ?  ? ?

## 2021-09-30 NOTE — Telephone Encounter (Signed)
Summary: concerns of yeast in the mouth  ? Pt stated she is experiencing yeast in the mouth and the otc medications are not working. Pt requests return call to discuss Rx being sent to [id number] - , [place] has appt scheduled for 10/04/21. Cb# [phone]   ?  ? ?Reason for Disposition ? Diabetes mellitus or weak immune system (e.g., HIV positive, cancer chemo, splenectomy, organ transplant, chronic steroids) ? ?Answer Assessment - Initial Assessment Questions ?1. SYMPTOM: "What's the main symptom you're concerned about?" (e.g., chapped lips, dry mouth, lump, sores) ?    Mouth and also vaginal symptoms of yeast ?2. ONSET: "When did the  yeast symptoms  start?" ?    weekend ?3. PAIN: "Is there any pain?" If Yes, ask: "How bad is it?" (Scale: 1-10; mild, moderate, severe) ?  - MILD (1-3):  doesn't interfere with eating or normal activities ?  - MODERATE (4-7): interferes with eating some solids and normal activities ?  - SEVERE (8-10):  excruciating pain, interferes with most normal activities ?  - SEVERE DYSPHAGIA: can't swallow liquids, drooling ?    Patient wears false teeth and has pain in mouth, vaginal discomfort ?4. CAUSE: "What do you think is causing the symptoms?" ?    Possible yeast- patient has started a new inhaler and this started just like before when first started Breo inhaler ?5. OTHER SYMPTOMS: "Do you have any other symptoms?" (e.g., fever, sore throat, toothache, swelling) ?    Vaginal discomfort ?6. PREGNANCY: "Is there any chance you are pregnant?" "When was your last menstrual period?" ?    na ? ?Protocols used: Mouth Symptoms-A-AH, Mouth Pain-A-AH ? ?

## 2021-09-30 NOTE — Telephone Encounter (Signed)
?  Chief Complaint: yeast infection suspected- oral and vaginal ?Symptoms: pain in mouth, discomfort in genital region ?Frequency: started over the week end ?Pertinent Negatives: Patient denies fever, sore throat, toothache, swelling ?Disposition: [] ED /[] Urgent Care (no appt availability in office) / [] Appointment(In office/virtual)/ []  [place] [facility]/ [] Home Care/ [x]  Refused Recommended Disposition /[] [place] Mobile Bus/ []  Follow-up with PCP ?Additional Notes: Patient is requesting oral yeast medication: Diflucan and oral nystatin - patient states she has yeast in mouth and vagina- patient states she has started a new inhaler and she has had this occur previously with other new inhaler starts. Patient states her glucose levels have been steady at 130 range and reports she has been under increased stress. Patient has been using OTC Monostat but not helping. Patient does have office appointment on Monday. Advised would send request for provider review   ?

## 2021-09-30 NOTE — Telephone Encounter (Signed)
Contacted patient back and scheduled appt for tomorrow for her to be evaluated. KW ?

## 2021-09-30 NOTE — Telephone Encounter (Signed)
[facility] faxed refill request for the following medications: ? ?metoprolol succinate (TOPROL-XL) 50 MG 24 hr tablet  ? ?Please advise. ? ?

## 2021-10-01 ENCOUNTER — Telehealth (INDEPENDENT_AMBULATORY_CARE_PROVIDER_SITE_OTHER): Payer: Medicare PPO | Admitting: Family Medicine

## 2021-10-01 ENCOUNTER — Encounter: Payer: Self-pay | Admitting: Family Medicine

## 2021-10-01 ENCOUNTER — Other Ambulatory Visit: Payer: Self-pay

## 2021-10-01 DIAGNOSIS — B3731 Acute candidiasis of vulva and vagina: Secondary | ICD-10-CM | POA: Diagnosis not present

## 2021-10-01 DIAGNOSIS — B3781 Candidal esophagitis: Secondary | ICD-10-CM | POA: Diagnosis not present

## 2021-10-01 DIAGNOSIS — G894 Chronic pain syndrome: Secondary | ICD-10-CM

## 2021-10-01 DIAGNOSIS — B370 Candidal stomatitis: Secondary | ICD-10-CM

## 2021-10-01 DIAGNOSIS — E782 Mixed hyperlipidemia: Secondary | ICD-10-CM

## 2021-10-01 DIAGNOSIS — G479 Sleep disorder, unspecified: Secondary | ICD-10-CM

## 2021-10-01 DIAGNOSIS — B372 Candidiasis of skin and nail: Secondary | ICD-10-CM | POA: Diagnosis not present

## 2021-10-01 DIAGNOSIS — Z7951 Long term (current) use of inhaled steroids: Secondary | ICD-10-CM

## 2021-10-01 DIAGNOSIS — F411 Generalized anxiety disorder: Secondary | ICD-10-CM

## 2021-10-01 MED ORDER — NYSTATIN 100000 UNIT/GM EX POWD: 1.0000 "application " | Freq: Three times a day (TID) | CUTANEOUS | 0 refills | Status: DC

## 2021-10-01 MED ORDER — FLUCONAZOLE 150 MG PO TABS: ORAL_TABLET | ORAL | 0 refills | Status: DC

## 2021-10-01 MED ORDER — ROSUVASTATIN CALCIUM 5 MG PO TABS: 5.0000 mg | ORAL_TABLET | Freq: Every day | ORAL | 3 refills | Status: DC

## 2021-10-01 MED ORDER — NYSTATIN 100000 UNIT/ML MT SUSP
5.0000 mL | Freq: Four times a day (QID) | OROMUCOSAL | 0 refills | Status: DC
Start: 2021-10-01 — End: 2022-10-20

## 2021-10-01 NOTE — Assessment & Plan Note (Signed)
D/t COPD ?Is rinsing out her mouth with use ?

## 2021-10-01 NOTE — Assessment & Plan Note (Signed)
Limits her ADLs and impedes her moods ?Included in psych referral  ?

## 2021-10-01 NOTE — Assessment & Plan Note (Signed)
Due to added stressors ?Called 9-1-1 the other day due to increase breathing and elevated HR ?Encouraged f/u with psych  ?

## 2021-10-01 NOTE — Assessment & Plan Note (Signed)
Description matches; unable to do examination due to nature of virtual visit ?Diflucan provided ?

## 2021-10-01 NOTE — Assessment & Plan Note (Signed)
Chronic, worsening ?Recently called 9-1-1 ?Took atarax, but it made the patient drowsy- encouraged to get pill cutter and split to 5mg  tablets ?Refer to psych for additional recommendations  ?

## 2021-10-01 NOTE — Assessment & Plan Note (Signed)
Vaginal/perineum area ?Recommend topical powder and cotton, loose fitting clothes to assist ?

## 2021-10-01 NOTE — Assessment & Plan Note (Signed)
Off her zetia; request for medication ?

## 2021-10-01 NOTE — Progress Notes (Signed)
I,[name],acting as a Education administrator for Linda Sprout, FNP.,have documented all relevant documentation on the behalf of Linda Sprout, FNP,as directed by  Linda Sprout, FNP while in the presence of Linda Sprout, FNP.  MyChart Video Visit    Virtual Visit via Video Note   This visit type was conducted due to national recommendations for restrictions regarding the COVID-19 Pandemic (e.g. social distancing) in an effort to limit this patient's exposure and mitigate transmission in our community. This patient is at least at moderate risk for complications without adequate follow up. This format is felt to be most appropriate for this patient at this time. Physical exam was limited by quality of the video and audio technology used for the visit.   Patient location: home Provider location: [facility]  I discussed the limitations of evaluation and management by telemedicine and the availability of in person appointments. The patient expressed understanding and agreed to proceed.  Patient: Linda Bradford   DOB: Aug 04, 1955   66 y.o. Female  MRN: [id number] Visit Date: 10/01/2021  Re-introduced to nurse practitioner role and practice setting.  All questions answered.  Discussed provider/patient relationship and expectations.  No chief complaint on file.  Subjective    HPI  Patient reports thrush in mouth and vaginal yeast. Patient reports white vaginal discharge and some burning with urination. Patient reports mouth is not hurting today. Patient reports she has not been on antibiotic since January and is under a lot of stress.    Medications: Outpatient Medications Prior to Visit  Medication Sig   acetaminophen (TYLENOL) 500 MG tablet Take 1,000 mg by mouth every 6 (six) hours as needed for mild pain or moderate pain.    albuterol (VENTOLIN HFA) 108 (90 Base) MCG/ACT inhaler INHALE 2 PUFFS INTO THE LUNGS EVERY 4 HOURS AS NEEDED FOR WHEEZING OR SHORTNESS OF BREATH   aspirin 81 MG chewable tablet  Chew 1 tablet (81 mg total) by mouth 2 (two) times daily. (Patient taking differently: Chew 81 mg by mouth once.)   benzonatate (TESSALON) 200 MG capsule TAKE 1 CAPSULE BY MOUTH 3 TIMES DAILY ASNEEDED   buPROPion (WELLBUTRIN XL) 150 MG 24 hr tablet Take 1 tablet (150 mg total) by mouth daily.   Cholecalciferol (VITAMIN D) 50 MCG (2000 UT) tablet Take 2,000 Units by mouth daily.   docusate sodium (COLACE) 100 MG capsule Take 1 capsule (100 mg total) by mouth 2 (two) times daily. (Patient taking differently: Take 100 mg by mouth daily as needed.)   DULoxetine (CYMBALTA) 60 MG capsule TAKE 1 CAPSULE BY MOUTH TWICE DAILY   fluticasone (FLONASE) 50 MCG/ACT nasal spray USE 2 SPRAYS IN EACH NOSTRIL ONCE DAILY   Fluticasone-Umeclidin-Vilant (TRELEGY ELLIPTA) 200-62.5-25 MCG/ACT AEPB Inhale 1 puff into the lungs 2 (two) times daily.   gabapentin (NEURONTIN) 300 MG capsule TAKE 3 CAPSULES BY MOUTH ONCE EVERY MORNING AND 5 CAPSULES AT BEDTIME   glucose blood (CONTOUR NEXT TEST) test strip To check blood sugar once daily   hydrOXYzine (ATARAX) 10 MG tablet Take 1 tablet (10 mg total) by mouth 3 (three) times daily as needed.   ipratropium-albuterol (DUONEB) 0.5-2.5 (3) MG/3ML SOLN Take 3 mLs by nebulization every 6 (six) hours as needed.   levothyroxine (SYNTHROID) 75 MCG tablet Take 1 tablet (75 mcg total) by mouth daily before breakfast.   loperamide (IMODIUM A-D) 2 MG tablet Take 2-4 mg by mouth 4 (four) times daily as needed for diarrhea or loose stools.  meloxicam (MOBIC) 15 MG tablet Take 1 tablet (15 mg total) by mouth daily.   metaxalone (SKELAXIN) 800 MG tablet TAKE 1 TABLET BY MOUTH 3 TIMES DAILY FORMUSCLE SPASMS (Patient taking differently: Take 800 mg by mouth daily.)   metFORMIN (GLUCOPHAGE-XR) 500 MG 24 hr tablet TAKE 2 TABLETS BY MOUTH ONCE A DAY WITH SUPPER   metoprolol succinate (TOPROL-XL) 50 MG 24 hr tablet TAKE 1 TABLET BY MOUTH ONCE DAILY. TAKE WITH OR IMMEDIATELY FOLLOWING A MEAL    primidone (MYSOLINE) 50 MG tablet Take 50 mg by mouth 2 (two) times daily.   Spacer/Aero-Holding Chambers (AEROCHAMBER PLUS WITH MASK) inhaler    traZODone (DESYREL) 100 MG tablet Take 2 tablets (200 mg total) by mouth at bedtime as needed for sleep.   vitamin B-12 (CYANOCOBALAMIN) 1000 MCG tablet Take 1,000 mcg by mouth daily.   ergocalciferol (VITAMIN D2) 1.25 MG (50000 UT) capsule Take 1 capsule (50,000 Units total) by mouth once a week. Once a week on Wednesday (Patient not taking: Reported on 10/01/2021)   ezetimibe (ZETIA) 10 MG tablet TAKE 1 TABLET DAILY (Patient not taking: Reported on 10/01/2021)   HYDROcodone-acetaminophen (NORCO) 7.5-325 MG tablet Take 1 tablet by mouth every 4 (four) hours as needed for severe pain (pain score 7-10). (Patient not taking: Reported on 10/01/2021)   methocarbamol (ROBAXIN) 500 MG tablet Take 1 tablet (500 mg total) by mouth every 6 (six) hours as needed for muscle spasms. (Patient not taking: Reported on 10/01/2021)   THEO-24 300 MG 24 hr capsule TAKE 1 CAPSULE BY MOUTH ONCE DAILY (Patient not taking: Reported on 10/01/2021)   traMADol (ULTRAM) 50 MG tablet Take 1 tablet (50 mg total) by mouth every 4 (four) hours as needed for moderate pain. (Patient not taking: Reported on 10/01/2021)   No facility-administered medications prior to visit.    Review of Systems  Genitourinary:  Positive for dysuria and vaginal discharge.     Objective    There were no vitals taken for this visit.   Physical Exam Pulmonary:     Effort: Pulmonary effort is normal.  Neurological:     Mental Status: She is alert.  Psychiatric:        Mood and Affect: Mood normal.        Behavior: Behavior normal.        Thought Content: Thought content normal.        Judgment: Judgment normal.       Assessment & Plan     Problem List Items Addressed This Visit       Digestive   Thrush of mouth and esophagus ([place]) - Primary    Complaints of thrush in back of throat and and in  mouth Associated with burning Denies sick contacts Is rinsing her mouth out following after COPD inhalers      Relevant Medications   fluconazole (DIFLUCAN) 150 MG tablet   nystatin (MYCOSTATIN/NYSTOP) powder   nystatin (MYCOSTATIN) 100000 UNIT/ML suspension     Musculoskeletal and Integument   Skin yeast infection    Vaginal/perineum area Recommend topical powder and cotton, loose fitting clothes to assist      Relevant Medications   fluconazole (DIFLUCAN) 150 MG tablet   nystatin (MYCOSTATIN/NYSTOP) powder   nystatin (MYCOSTATIN) 100000 UNIT/ML suspension     Genitourinary   Vaginal yeast infection    Description matches; unable to do examination due to nature of virtual visit Diflucan provided      Relevant Medications   fluconazole (DIFLUCAN) 150 MG  tablet   nystatin (MYCOSTATIN/NYSTOP) powder   nystatin (MYCOSTATIN) 100000 UNIT/ML suspension     Other   Anxiety, generalized    Chronic, worsening Recently called 9-1-1 Took atarax, but it made the patient drowsy- encouraged to get pill cutter and split to 5mg  tablets Refer to psych for additional recommendations       Relevant Orders   Ambulatory referral to Psychiatry   Ambulatory referral to Psychology   Chronic pain syndrome    Limits her ADLs and impedes her moods Included in psych referral       Relevant Orders   Ambulatory referral to Psychiatry   Ambulatory referral to Psychology   Current chronic use of inhaled steroid    D/t COPD Is rinsing out her mouth with use      Relevant Medications   nystatin (MYCOSTATIN) 100000 UNIT/ML suspension   Disordered sleep    Due to added stressors Called 9-1-1 the other day due to increase breathing and elevated HR Encouraged f/u with psych       Relevant Orders   Ambulatory referral to Psychiatry   Ambulatory referral to Psychology   Mixed hyperlipidemia    Off her zetia; request for medication      Relevant Medications   rosuvastatin (CRESTOR) 5  MG tablet     No follow-ups on file.     I discussed the assessment and treatment plan with the patient. The patient was provided an opportunity to ask questions and all were answered. The patient agreed with the plan and demonstrated an understanding of the instructions.   The patient was advised to call back or seek an in-person evaluation if the symptoms worsen or if the condition fails to improve as anticipated.  I provided 20 minutes of non-face-to-face time during this encounter.  Vonna Kotyk, FNP, have reviewed all documentation for this visit. The documentation on 10/01/21 for the exam, diagnosis, procedures, and orders are all accurate and complete.   Linda Bradford, [place] [phone] (phone) [phone] (fax)  [place]

## 2021-10-01 NOTE — Assessment & Plan Note (Signed)
Complaints of thrush in back of throat and and in mouth ?Associated with burning ?Denies sick contacts ?Is rinsing her mouth out following after COPD inhalers ?

## 2021-10-04 ENCOUNTER — Ambulatory Visit: Payer: Medicare PPO | Admitting: Family Medicine

## 2021-10-04 NOTE — Progress Notes (Deleted)
?  ? ?I,Farhiya Rosten D Shyrl Obi,acting as a scribe for [name], FNP.,have documented all relevant documentation on the behalf of [name], FNP,as directed by  [name], FNP while in the presence of [name], FNP. ? ?Established patient visit ? ? ?Patient: Linda Bradford   DOB: 10-07-1955   66 y.o. Female  MRN: [id number] ?Visit Date: 10/04/2021 ? ?Today's healthcare provider: [name], FNP  ? ?No chief complaint on file. ? ?Subjective  ?  ?HPI  ?Patient is a 66 year old female who presents for evaluation of her shortness of breath.  She was seen on 09/27/21 by Dr Caryn Section for dyspnea on exertion.  ? ?Medications: ?Outpatient Medications Prior to Visit  ?Medication Sig  ? acetaminophen (TYLENOL) 500 MG tablet Take 1,000 mg by mouth every 6 (six) hours as needed for mild pain or moderate pain.   ? albuterol (VENTOLIN HFA) 108 (90 Base) MCG/ACT inhaler INHALE 2 PUFFS INTO THE LUNGS EVERY 4 HOURS AS NEEDED FOR WHEEZING OR SHORTNESS OF BREATH  ? aspirin 81 MG chewable tablet Chew 1 tablet (81 mg total) by mouth 2 (two) times daily. (Patient taking differently: Chew 81 mg by mouth once.)  ? benzonatate (TESSALON) 200 MG capsule TAKE 1 CAPSULE BY MOUTH 3 TIMES DAILY ASNEEDED  ? buPROPion (WELLBUTRIN XL) 150 MG 24 hr tablet Take 1 tablet (150 mg total) by mouth daily.  ? Cholecalciferol (VITAMIN D) 50 MCG (2000 UT) tablet Take 2,000 Units by mouth daily.  ? docusate sodium (COLACE) 100 MG capsule Take 1 capsule (100 mg total) by mouth 2 (two) times daily. (Patient taking differently: Take 100 mg by mouth daily as needed.)  ? DULoxetine (CYMBALTA) 60 MG capsule TAKE 1 CAPSULE BY MOUTH TWICE DAILY  ? fluconazole (DIFLUCAN) 150 MG tablet Take 1 tablet by mouth, if symptoms still remain after 3 days- take additional tablet by mouth.  ? fluticasone (FLONASE) 50 MCG/ACT nasal spray USE 2 SPRAYS IN EACH NOSTRIL ONCE DAILY  ? Fluticasone-Umeclidin-Vilant (TRELEGY ELLIPTA) 200-62.5-25 MCG/ACT AEPB Inhale 1 puff into the  lungs 2 (two) times daily.  ? gabapentin (NEURONTIN) 300 MG capsule TAKE 3 CAPSULES BY MOUTH ONCE EVERY MORNING AND 5 CAPSULES AT BEDTIME  ? glucose blood (CONTOUR NEXT TEST) test strip To check blood sugar once daily  ? hydrOXYzine (ATARAX) 10 MG tablet Take 1 tablet (10 mg total) by mouth 3 (three) times daily as needed.  ? ipratropium-albuterol (DUONEB) 0.5-2.5 (3) MG/3ML SOLN Take 3 mLs by nebulization every 6 (six) hours as needed.  ? levothyroxine (SYNTHROID) 75 MCG tablet Take 1 tablet (75 mcg total) by mouth daily before breakfast.  ? loperamide (IMODIUM A-D) 2 MG tablet Take 2-4 mg by mouth 4 (four) times daily as needed for diarrhea or loose stools.  ? meloxicam (MOBIC) 15 MG tablet Take 1 tablet (15 mg total) by mouth daily.  ? metaxalone (SKELAXIN) 800 MG tablet TAKE 1 TABLET BY MOUTH 3 TIMES DAILY FORMUSCLE SPASMS (Patient taking differently: Take 800 mg by mouth daily.)  ? metFORMIN (GLUCOPHAGE-XR) 500 MG 24 hr tablet TAKE 2 TABLETS BY MOUTH ONCE A DAY WITH SUPPER  ? metoprolol succinate (TOPROL-XL) 50 MG 24 hr tablet TAKE 1 TABLET BY MOUTH ONCE DAILY. TAKE WITH OR IMMEDIATELY FOLLOWING A MEAL  ? nystatin (MYCOSTATIN) 100000 UNIT/ML suspension Take 5 mLs (500,000 Units total) by mouth 4 (four) times daily.  ? nystatin (MYCOSTATIN/NYSTOP) powder Apply 1 application topically 3 (three) times daily.  ? primidone (  MYSOLINE) 50 MG tablet Take 50 mg by mouth 2 (two) times daily.  ? rosuvastatin (CRESTOR) 5 MG tablet Take 1 tablet (5 mg total) by mouth daily.  ? Spacer/Aero-Holding Chambers (AEROCHAMBER PLUS WITH MASK) inhaler   ? THEO-24 300 MG 24 hr capsule TAKE 1 CAPSULE BY MOUTH ONCE DAILY (Patient not taking: Reported on 10/01/2021)  ? traZODone (DESYREL) 100 MG tablet Take 2 tablets (200 mg total) by mouth at bedtime as needed for sleep.  ? vitamin B-12 (CYANOCOBALAMIN) 1000 MCG tablet Take 1,000 mcg by mouth daily.  ? ?No facility-administered medications prior to visit.  ? ? ?Review of Systems ? ?{Labs   Heme  Chem  Endocrine  Serology  Results Review (optional):23779} ?  Objective  ?  ?There were no vitals taken for this visit. ?{Show previous vital signs (optional):23777} ? ?Physical Exam  ?*** ? ?No results found for any visits on 10/04/21. ? Assessment & Plan  ?  ? ?*** ? ?No follow-ups on file.  ?   ? ?{provider attestation***:1} ? ? ?[name], FNP  ?[place] ?[phone] (phone) ?[phone] (fax) ? ?[facility] ?

## 2021-10-07 ENCOUNTER — Other Ambulatory Visit: Payer: Self-pay

## 2021-10-07 ENCOUNTER — Encounter: Payer: Medicare PPO | Admitting: *Deleted

## 2021-10-07 DIAGNOSIS — R911 Solitary pulmonary nodule: Secondary | ICD-10-CM

## 2021-10-07 DIAGNOSIS — Z006 Encounter for examination for normal comparison and control in clinical research program: Secondary | ICD-10-CM

## 2021-10-07 NOTE — Research (Signed)
Title: NIGHTINGALE: CliNIcal Utility of ManaGement of Patients witH CT and LDCT Identified Pulmonary Nodules UsinG the Percepta NasAL Swab ClassifiEr -- with Familiarization ?  ?Protocol #: [id number] Sponsor: [employer] ?  ?Protocol Revision 1 dated 01Sep2022 and confirmed current on today's visit, IRB approved Revision 1 on 29Dec2022. ?  ?Objectives:  ?Primary: To evaluate if use of the Percepta Nasal Swab test in the diagnostic work up ?of newly identified pulmonary nodules reduces the number of invasive procedures in the ?group classified as low-risk by the test and that are benign as compared to a control ?group managed without a Percepta Nasal Swab test result. ?                  A newly identified nodule is defined as any nodule first identified on imaging ?                  <90 days prior to nasal sample collection that hasn?t undergone a diagnostic ?                   procedure for the management of their index nodule prior to enrollment. ?                  CT imaging includes conventional CT, LDCT, HRCT ?                  Benign diagnosis is defined as a specific diagnosis of a benign condition, ?                   radiographic resolution or stability at ? 24 months, or no cytological, ?                   radiological, or pathological evidence of cancer. ?                  Procedures will be categorized as either invasive or non-invasive in the Data ?                   Management Plan (DMP). ?Secondary: To evaluate if use of the Percepta Nasal Swab test in the diagnostic work ?up of newly identified pulmonary nodules increases the proportion of subjects classified ?as high-risk by the test and have primary lung cancer that go directly to appropriate ?therapy as compared to a control group managed without a Percepta Nasal Swab test ?result. ?                   Proportion of subjects that go directly to appropriate therapy is defined as those ?                    subjects that undergo surgery, ablative  or other appropriate therapy as the next ?                    step after the Percepta Nasal Swab test result without intervening non-surgical ?                    procedures ?                            a. Non-surgical procedures include diagnostic PET, but not PET for ?                                  staging purposes. ?                            b. Appropriate therapies will be defined in the CRF. ?                   A newly identified nodule is defined as any nodule first identified on imaging ?                   <90 days prior to nasal sample collection. ?                   Lung cancer diagnosis is defined as established by cytology or pathology, or in ?                    circumstances where a presumptive diagnosis of cancer led to definitive ?                    ablative or other appropriate therapy without pathology. ?Key Inclusion Criteria: ? Inclusion Criteria: ? Able to tolerate nasal epithelial specimen collection ? Signed written Informed Consent obtained ? Subject clinical history available for review by sponsor and regulatory agencies ? New nodule first identified on imaging < 90 days prior to nasal sample collection ?(index nodule) ? CT report available for index nodule ? 31 - 44 years of age ? Current or former smoker (>100 cigarettes in a lifetime) ? Pulmonary nodule ?30 mm detected by CT ? ?Key Exclusion Criteria: ?Exclusion Criteria ? Subject has undergone a diagnostic procedure for the management of their ?index nodule after the index CT and prior to enrollment ? Active cancer (other than non-melanoma skin cancer) ? Prior primary lung cancer (prior non-lung cancer acceptable) ? Prior participation in this study (i.e., subjects may not be enrolled more than ?once) ? Current active treatment with an investigational device or drug (patients in trial ?follow up period are okay if intervention phase is complete) ? Patient enrolled or planned to be enrolled in another clinical trial that may ?influence  management of the patient?s nodule ? Concurrent or planned use of tools or tests for assigning lung nodule risk of ?malignancy (e.g., genomic or proteomic blood tests) other than clinically ?validated risk calculators ? ?Clinical Research Coordinator / Research RN note : This visit is for enrollment /Baseline Subject [id number] with DOB: [id number] on [id number] for the above protocol is an Enrollment Visit and is for purpose of research.  ?  ?Subject expressed interest and consent in continuing as a study subject. Subject confirmed contact information (e.g. address, telephone, email). Subject thanked for participation in research and contribution to science.   ?  ?During this visit on 09Mar2023  , the subject reviewed and signed the consent form, provided demographics, and had a nasal swab collected per the above referenced protocol. Please refer to the subject's paper source binder for further details.  ? ?PI, Dr. Valeta Harms, was present for consenting process. ? ?  ?Signed by ?Jaye Beagle RN CRN II ? ? ? ?

## 2021-10-11 DIAGNOSIS — R011 Cardiac murmur, unspecified: Secondary | ICD-10-CM | POA: Insufficient documentation

## 2021-10-12 ENCOUNTER — Telehealth: Payer: Self-pay

## 2021-10-12 DIAGNOSIS — J441 Chronic obstructive pulmonary disease with (acute) exacerbation: Secondary | ICD-10-CM

## 2021-10-12 NOTE — Telephone Encounter (Signed)
Patient advised of lab results. She is requesting to hold off on echocardiogram until after her pulmonology appt.  ?

## 2021-10-12 NOTE — Telephone Encounter (Signed)
-----   Message from Birdie Sons, MD sent at 10/11/2021  8:10 AM EDT ----- ?Labs are all normal. No sign of any heart problems and blood cell count is normal. Shortness of breath may be related to COPD, recommend referral to pulmonary for shortness of breath/COPD.  ?Also recommend echocardiogram due to heart murmur and aortic regurgitation on last echo done 5 years ago.  ? ?

## 2021-10-15 ENCOUNTER — Other Ambulatory Visit: Payer: Self-pay | Admitting: Family Medicine

## 2021-10-15 DIAGNOSIS — S39012A Strain of muscle, fascia and tendon of lower back, initial encounter: Secondary | ICD-10-CM

## 2021-10-15 NOTE — Telephone Encounter (Signed)
Requested medication (s) are due for refill today: yes ? ?Requested medication (s) are on the active medication list: yes ? ?Last refill:  06/21/21 #30 with 1 RF ? ?Future visit scheduled: 11/30/21 ? ?Notes to clinic:  This medication can not be delegated, please assess.  ? ? ? ?  ? ?Requested Prescriptions  ?Pending Prescriptions Disp Refills  ? metaxalone (SKELAXIN) 800 MG tablet [Pharmacy Med Name: METAXALONE 800 MG TAB] 30 tablet 1  ?  Sig: TAKE 1 TABLET BY MOUTH 3 TIMES DAILY FORMUSCLE SPASMS  ?  ? Not Delegated - Analgesics:  Muscle Relaxants Failed - 10/15/2021  5:54 PM  ?  ?  Failed - This refill cannot be delegated  ?  ?  Passed - Valid encounter within last 6 months  ?  Recent Outpatient Visits   ? ?      ? 2 weeks ago Thrush of mouth and esophagus ([place])  ? [facility] Tally Joe T, FNP  ? 2 weeks ago Dyspnea on exertion  ? [facility] [place], Kirstie Peri, MD  ? 1 month ago Chronic obstructive pulmonary disease with acute exacerbation ([place])  ? [place] [facility] Tally Joe T, FNP  ? 6 months ago Diabetes mellitus without complication [facility])  ? [place], PA-C  ? 1 year ago Strain of lumbar region, initial encounter  ? [place], [place]  ? ?  ?  ?Future Appointments   ? ?        ? In 1 month Drubel, [facility], [facility]  ? ?  ? ?  ?  ?  ? ? ?

## 2021-10-24 ENCOUNTER — Encounter: Payer: Self-pay | Admitting: Family Medicine

## 2021-10-24 DIAGNOSIS — I351 Nonrheumatic aortic (valve) insufficiency: Secondary | ICD-10-CM | POA: Insufficient documentation

## 2021-10-24 DIAGNOSIS — I517 Cardiomegaly: Secondary | ICD-10-CM | POA: Insufficient documentation

## 2021-10-29 ENCOUNTER — Other Ambulatory Visit: Payer: Self-pay | Admitting: Family Medicine

## 2021-10-29 DIAGNOSIS — J441 Chronic obstructive pulmonary disease with (acute) exacerbation: Secondary | ICD-10-CM

## 2021-11-04 ENCOUNTER — Telehealth: Payer: Self-pay | Admitting: Family Medicine

## 2021-11-04 NOTE — Telephone Encounter (Signed)
Left message on pt's vm to advise of appointment at [place] at [facility] 11/23/21 at 3:00 ?Phone: [phone] ?Address: [address], [place], [place] [address] ?Referral has been in their wq since 10/12/21 ?

## 2021-11-04 NOTE — Telephone Encounter (Signed)
Can someone please check patient and with [place] pulmonology to find out what the hold up is with the referral that was placed on 10/12/2021. Thanks.  ?

## 2021-11-23 ENCOUNTER — Other Ambulatory Visit: Payer: Self-pay | Admitting: Family Medicine

## 2021-11-23 ENCOUNTER — Institutional Professional Consult (permissible substitution): Payer: Medicare PPO | Admitting: Pulmonary Disease

## 2021-11-23 DIAGNOSIS — F32A Depression, unspecified: Secondary | ICD-10-CM

## 2021-11-23 DIAGNOSIS — S39012A Strain of muscle, fascia and tendon of lower back, initial encounter: Secondary | ICD-10-CM

## 2021-11-23 DIAGNOSIS — F411 Generalized anxiety disorder: Secondary | ICD-10-CM

## 2021-11-23 DIAGNOSIS — F331 Major depressive disorder, recurrent, moderate: Secondary | ICD-10-CM

## 2021-11-30 ENCOUNTER — Encounter: Payer: Medicare PPO | Admitting: Physician Assistant

## 2021-11-30 ENCOUNTER — Ambulatory Visit: Payer: Medicare PPO | Admitting: Family Medicine

## 2021-12-01 ENCOUNTER — Other Ambulatory Visit: Payer: Self-pay | Admitting: Family Medicine

## 2021-12-01 DIAGNOSIS — F32A Depression, unspecified: Secondary | ICD-10-CM

## 2021-12-06 ENCOUNTER — Other Ambulatory Visit: Payer: Self-pay | Admitting: Family Medicine

## 2021-12-06 DIAGNOSIS — F331 Major depressive disorder, recurrent, moderate: Secondary | ICD-10-CM

## 2021-12-06 DIAGNOSIS — F411 Generalized anxiety disorder: Secondary | ICD-10-CM

## 2021-12-06 DIAGNOSIS — S39012A Strain of muscle, fascia and tendon of lower back, initial encounter: Secondary | ICD-10-CM

## 2021-12-07 ENCOUNTER — Other Ambulatory Visit: Payer: Self-pay | Admitting: Family Medicine

## 2021-12-07 ENCOUNTER — Encounter: Payer: Self-pay | Admitting: Physician Assistant

## 2021-12-07 ENCOUNTER — Ambulatory Visit (INDEPENDENT_AMBULATORY_CARE_PROVIDER_SITE_OTHER): Payer: Medicare PPO | Admitting: Physician Assistant

## 2021-12-07 VITALS — BP 106/44 | HR 68 | Temp 98.1°F | Ht 71.0 in | Wt 225.5 lb

## 2021-12-07 DIAGNOSIS — F411 Generalized anxiety disorder: Secondary | ICD-10-CM

## 2021-12-07 DIAGNOSIS — F331 Major depressive disorder, recurrent, moderate: Secondary | ICD-10-CM

## 2021-12-07 DIAGNOSIS — Z1231 Encounter for screening mammogram for malignant neoplasm of breast: Secondary | ICD-10-CM

## 2021-12-07 DIAGNOSIS — E1169 Type 2 diabetes mellitus with other specified complication: Secondary | ICD-10-CM | POA: Diagnosis not present

## 2021-12-07 DIAGNOSIS — E1165 Type 2 diabetes mellitus with hyperglycemia: Secondary | ICD-10-CM | POA: Diagnosis not present

## 2021-12-07 DIAGNOSIS — J418 Mixed simple and mucopurulent chronic bronchitis: Secondary | ICD-10-CM

## 2021-12-07 DIAGNOSIS — Z0000 Encounter for general adult medical examination without abnormal findings: Secondary | ICD-10-CM

## 2021-12-07 DIAGNOSIS — E875 Hyperkalemia: Secondary | ICD-10-CM

## 2021-12-07 DIAGNOSIS — E785 Hyperlipidemia, unspecified: Secondary | ICD-10-CM

## 2021-12-07 DIAGNOSIS — E1159 Type 2 diabetes mellitus with other circulatory complications: Secondary | ICD-10-CM

## 2021-12-07 DIAGNOSIS — R21 Rash and other nonspecific skin eruption: Secondary | ICD-10-CM

## 2021-12-07 DIAGNOSIS — I152 Hypertension secondary to endocrine disorders: Secondary | ICD-10-CM

## 2021-12-07 MED ORDER — THEOPHYLLINE ER 300 MG PO CP24: 300.0000 mg | ORAL_CAPSULE | Freq: Every day | ORAL | 1 refills | Status: DC

## 2021-12-07 MED ORDER — IPRATROPIUM-ALBUTEROL 0.5-2.5 (3) MG/3ML IN SOLN: 3.0000 mL | Freq: Four times a day (QID) | RESPIRATORY_TRACT | 11 refills | Status: DC | PRN

## 2021-12-07 MED ORDER — BUPROPION HCL ER (XL) 150 MG PO TB24: 150.0000 mg | ORAL_TABLET | Freq: Every day | ORAL | 3 refills | Status: DC

## 2021-12-07 NOTE — Assessment & Plan Note (Signed)
Manages with crestor 5, previously uncontrolled, LDL goal < 70 ?Will recheck labs today, last food > 6 hr ago ?

## 2021-12-07 NOTE — Assessment & Plan Note (Addendum)
Managed with trelegy, reports improvement of symptoms w/ theophyilline. Advised we would check toxicity levels in 6 mo if we restarted ?Has duoneb for exacerbations ?LDCT for chronic tobacco use completed 2/23 ?

## 2021-12-07 NOTE — Assessment & Plan Note (Signed)
Previously well controlled ?Will check a1c ?

## 2021-12-07 NOTE — Progress Notes (Signed)
? ?I,Sha'taria Tyson,acting as a Education administrator for [employer], PA-C.,have documented all relevant documentation on the behalf of Linda Kirschner, PA-C,as directed by  Linda Kirschner, PA-C while in the presence of Linda Kirschner, PA-C. ? ?Complete physical exam ? ? ?Patient: Linda Bradford   DOB: 10/19/1955   66 y.o. Female  MRN: [id number] ?Visit Date: 12/07/2021 ? ?Today's healthcare provider: [name], PA-C  ? ?No chief complaint on file. ? ?Subjective  ?  ?Linda Bradford is a 66 y.o. female who presents today for a complete physical exam.  ?She reports consuming a general diet. Home exercise routine includes walking 45 minutess hrs per week. She generally feels well. She reports sleeping well. She does have additional problems to discuss today.  ?HPI  ? ?Vaginal lesion ?-reports history of vaginitis years ago tx with hormonal cream. Has been unmedicated for a while but reports exacerbation of irritation/bleeding. ? ?GERD ?-comes and goes per pt, tx with alka seltzer. Denies abdominal pain.  ? ? ?Past Medical History:  ?Diagnosis Date  ? Anxiety   ? Arthritis   ? Cancer [facility])   ? skin cancer, removed  ? COPD (chronic obstructive pulmonary disease) ([place])   ? Depression   ? Diabetes mellitus without complication ([place])   ? Dysrhythmia   ? occasional pvc's. not being treated.  ? Fatigue   ? GERD (gastroesophageal reflux disease)   ? Headache   ? Hypertension   ? Hypothyroidism   ? Thyroid disease   ? Tremors of nervous system   ? ?Past Surgical History:  ?Procedure Laterality Date  ? APPENDECTOMY    ? CHOLECYSTECTOMY N/A 07/01/2019  ? Procedure: LAPAROSCOPIC CHOLECYSTECTOMY;  Surgeon: Olean Ree, MD;  Location: [facility] ORS;  Service: General;  Laterality: N/A;  ? COLECTOMY WITH COLOSTOMY CREATION/HARTMANN PROCEDURE N/A 09/03/2018  ? Error, this was not done.  ? COLONOSCOPY WITH PROPOFOL N/A 03/08/2019  ? Procedure: COLONOSCOPY WITH PROPOFOL;  Surgeon: Virgel Manifold, MD;  Location: [facility];  Service: Endoscopy;   Laterality: N/A;  ? CYSTOSCOPY WITH STENT PLACEMENT Bilateral 09/03/2018  ? Procedure: CYSTOSCOPY WITH STENT PLACEMENT-LIGHTED STENTS;  Surgeon: Hollice Espy, MD;  Location: [facility] ORS;  Service: Urology;  Laterality: Bilateral;  ? LAPAROSCOPIC SIGMOID COLECTOMY N/A 09/03/2018  ? Procedure: LAPAROSCOPIC SIGMOID COLECTOMY;  Surgeon: Olean Ree, MD;  Location: [facility] ORS;  Service: General;  Laterality: N/A;  ? percutaneous drainage tube  07/2018  ? 2nd tube placed. not healing in colon, causing a fistula  ? TONSILLECTOMY    ? TOTAL KNEE ARTHROPLASTY Left 05/10/2021  ? Procedure: TOTAL KNEE ARTHROPLASTY;  Surgeon: Lovell Sheehan, MD;  Location: [facility] ORS;  Service: Orthopedics;  Laterality: Left;  ? TUBAL LIGATION    ? ?Social History  ? ?Socioeconomic History  ? Marital status: Married  ?  Spouse name: Clair Gulling  ? Number of children: 2  ? Years of education: Not on file  ? Highest education level: Not on file  ?Occupational History  ? Occupation: Careers information officer  ?  Comment: currently employed  ?Tobacco Use  ? Smoking status: Every Day  ?  Packs/day: 1.50  ?  Years: 45.00  ?  Pack years: 67.50  ?  Types: Cigarettes  ?  Start date: 03/02/1980  ? Smokeless tobacco: Never  ?Vaping Use  ? Vaping Use: Former  ? Devices: only for a month. did not like  ?Substance and Sexual Activity  ? Alcohol use: No  ?  Alcohol/week: 0.0 standard drinks  ? Drug use: No  ?  Sexual activity: Yes  ?Other Topics Concern  ? Not on file  ?Social History Narrative  ? Live with hubby at home  ? ?Social Determinants of Health  ? ?Financial Resource Strain: Not on file  ?Food Insecurity: Not on file  ?Transportation Needs: Not on file  ?Physical Activity: Not on file  ?Stress: Not on file  ?Social Connections: Not on file  ?Intimate Partner Violence: Not on file  ? ?Family Status  ?Relation Name Status  ? Mother  Deceased  ? Sister  Alive  ? Brother  Deceased at age 11  ? Mat Aunt  Deceased  ? Son  Alive  ? Son  Alive  ? Father  Deceased at age 67  ? Sister   Alive  ? Brother  Deceased  ? Mat Uncle  (Not Specified)  ? MGM  Alive  ? ?Family History  ?Problem Relation Age of Onset  ? Lung cancer Mother   ? Alcohol abuse Sister   ? Arthritis Sister   ? Alcohol abuse Brother   ? Heart disease Brother 95  ? Heart attack Brother   ? Pancreatic cancer Maternal Aunt   ? Diabetes Maternal Aunt   ? Alcohol abuse Son   ? Drug abuse Son   ? Drug abuse Son   ? Heart disease Father 95  ? Diabetes Sister   ? Breast cancer Brother   ? Alcohol abuse Maternal Uncle   ? Alcohol abuse Maternal Grandmother   ? Diabetes Maternal Grandmother   ? ?Allergies  ?Allergen Reactions  ? Dexilant [Dexlansoprazole] Nausea Only and Other (See Comments)  ?  Dizziness  ?  ?Patient Care Team: ?[name], FNP as PCP - General (Family Medicine) ?Pa, [facility])  ? ?Medications: ?Outpatient Medications Prior to Visit  ?Medication Sig  ? acetaminophen (TYLENOL) 500 MG tablet Take 1,000 mg by mouth every 6 (six) hours as needed for mild pain or moderate pain.   ? albuterol (VENTOLIN HFA) 108 (90 Base) MCG/ACT inhaler INHALE 2 PUFFS INTO THE LUNGS EVERY 4 HOURS AS NEEDED FOR WHEEZING OR SHORTNESS OF BREATH  ? aspirin 81 MG chewable tablet Chew 1 tablet (81 mg total) by mouth 2 (two) times daily. (Patient taking differently: Chew 81 mg by mouth once.)  ? benzonatate (TESSALON) 200 MG capsule TAKE 1 CAPSULE BY MOUTH 3 TIMES DAILY ASNEEDED  ? buPROPion (WELLBUTRIN XL) 150 MG 24 hr tablet TAKE 1 TABLET BY MOUTH ONCE DAILY. *MAKEAPPOINTMENT FOR FURTHER FILLS*  ? Cholecalciferol (VITAMIN D) 50 MCG (2000 UT) tablet Take 2,000 Units by mouth daily.  ? docusate sodium (COLACE) 100 MG capsule Take 1 capsule (100 mg total) by mouth 2 (two) times daily. (Patient taking differently: Take 100 mg by mouth daily as needed.)  ? DULoxetine (CYMBALTA) 60 MG capsule TAKE 1 CAPSULE BY MOUTH TWICE DAILY  ? fluconazole (DIFLUCAN) 150 MG tablet Take 1 tablet by mouth, if symptoms still remain after 3 days- take  additional tablet by mouth.  ? fluticasone (FLONASE) 50 MCG/ACT nasal spray USE 2 SPRAYS IN EACH NOSTRIL ONCE DAILY  ? Fluticasone-Umeclidin-Vilant (TRELEGY ELLIPTA) 200-62.5-25 MCG/ACT AEPB Inhale 1 puff into the lungs 2 (two) times daily.  ? gabapentin (NEURONTIN) 300 MG capsule TAKE 3 CAPSULES BY MOUTH ONCE EVERY MORNING AND 5 CAPSULES AT BEDTIME  ? glucose blood (CONTOUR NEXT TEST) test strip To check blood sugar once daily  ? hydrOXYzine (ATARAX) 10 MG tablet Take 1 tablet (10 mg total) by mouth 3 (three)  times daily as needed.  ? levothyroxine (SYNTHROID) 75 MCG tablet Take 1 tablet (75 mcg total) by mouth daily before breakfast.  ? loperamide (IMODIUM A-D) 2 MG tablet Take 2-4 mg by mouth 4 (four) times daily as needed for diarrhea or loose stools.  ? meloxicam (MOBIC) 15 MG tablet Take 1 tablet (15 mg total) by mouth daily.  ? metaxalone (SKELAXIN) 800 MG tablet TAKE 1 TABLET BY MOUTH 3 TIMES DAILY FORMUSCLE SPASMS  ? metFORMIN (GLUCOPHAGE-XR) 500 MG 24 hr tablet TAKE 2 TABLETS BY MOUTH ONCE A DAY WITH SUPPER  ? metoprolol succinate (TOPROL-XL) 50 MG 24 hr tablet TAKE 1 TABLET BY MOUTH ONCE DAILY. TAKE WITH OR IMMEDIATELY FOLLOWING A MEAL  ? nystatin (MYCOSTATIN) 100000 UNIT/ML suspension Take 5 mLs (500,000 Units total) by mouth 4 (four) times daily.  ? nystatin (MYCOSTATIN/NYSTOP) powder Apply 1 application topically 3 (three) times daily.  ? primidone (MYSOLINE) 50 MG tablet Take 50 mg by mouth 2 (two) times daily.  ? rosuvastatin (CRESTOR) 5 MG tablet Take 1 tablet (5 mg total) by mouth daily.  ? Spacer/Aero-Holding Chambers (AEROCHAMBER PLUS WITH MASK) inhaler   ? traZODone (DESYREL) 100 MG tablet Take 2 tablets (200 mg total) by mouth at bedtime as needed for sleep.  ? vitamin B-12 (CYANOCOBALAMIN) 1000 MCG tablet Take 1,000 mcg by mouth daily.  ? [DISCONTINUED] ipratropium-albuterol (DUONEB) 0.5-2.5 (3) MG/3ML SOLN Take 3 mLs by nebulization every 6 (six) hours as needed.  ? [DISCONTINUED] THEO-24 300  MG 24 hr capsule TAKE 1 CAPSULE BY MOUTH ONCE DAILY  ? HYDROcodone-acetaminophen (NORCO/VICODIN) 5-325 MG tablet hydrocodone 5 mg-acetaminophen 325 mg tablet ? TAKE 1 TABLET BY MOUTH EVERY 4 HOURS FOR 5

## 2021-12-07 NOTE — Assessment & Plan Note (Addendum)
Per pt history of vaginitis, treated with hormonal cream ?Unable to find this in brief chart review ?vulvovaginitis vs candida ?  ?Pt declines topical candida treatment as she states she used OTC monitstat with no improvement ?Ref to gyn for second opinion ?

## 2021-12-07 NOTE — Patient Instructions (Signed)
Please contact [phone] to schedule your mammogram. They will ask which location you prefer to be seen in. You have two options listed below.  1) [facility] located at [address] [place], [place] [address] 2) [facility] [place] located at [address] [place], [place] [address]  Please feel free to contact us if you have any further questions or concerns  

## 2021-12-08 ENCOUNTER — Other Ambulatory Visit: Payer: Self-pay | Admitting: Physician Assistant

## 2021-12-08 DIAGNOSIS — E785 Hyperlipidemia, unspecified: Secondary | ICD-10-CM

## 2021-12-08 LAB — BASIC METABOLIC PANEL
BUN/Creatinine Ratio: 18 (ref 12–28)
BUN: 13 mg/dL (ref 8–27)
CO2: 23 mmol/L (ref 20–29)
Calcium: 9.9 mg/dL (ref 8.7–10.3)
Chloride: 98 mmol/L (ref 96–106)
Creatinine, Ser: 0.74 mg/dL (ref 0.57–1.00)
Glucose: 105 mg/dL — ABNORMAL HIGH (ref 70–99)
Potassium: 5.5 mmol/L — ABNORMAL HIGH (ref 3.5–5.2)
Sodium: 136 mmol/L (ref 134–144)
eGFR: 90 mL/min/{1.73_m2} (ref >59)

## 2021-12-08 LAB — LIPID PANEL
Chol/HDL Ratio: 2.8 ratio (ref 0.0–4.4)
Cholesterol, Total: 169 mg/dL (ref 100–199)
HDL: 60 mg/dL (ref >39)
LDL Chol Calc (NIH): 83 mg/dL (ref 0–99)
Triglycerides: 149 mg/dL (ref 0–149)
VLDL Cholesterol Cal: 26 mg/dL (ref 5–40)

## 2021-12-08 LAB — HEMOGLOBIN A1C
Est. average glucose Bld gHb Est-mCnc: 143 mg/dL
Hgb A1c MFr Bld: 6.6 % — ABNORMAL HIGH (ref 4.8–5.6)

## 2021-12-08 MED ORDER — ROSUVASTATIN CALCIUM 10 MG PO TABS
10.0000 mg | ORAL_TABLET | Freq: Every day | ORAL | 3 refills | Status: DC
Start: 2021-12-08 — End: 2022-08-17

## 2021-12-16 ENCOUNTER — Telehealth: Payer: Self-pay

## 2021-12-16 NOTE — Telephone Encounter (Signed)
Patient is scheduled for 12/20/21 with [facility]

## 2021-12-16 NOTE — Telephone Encounter (Signed)
-----   Message from Excell Seltzer, RN sent at 12/15/2021  1:58 PM EDT ----- Regarding: Referral Schedule in 2-3 weeks or sooner if you have it with APP for Vulvar rash

## 2021-12-20 ENCOUNTER — Encounter: Payer: Medicare PPO | Admitting: Obstetrics

## 2021-12-20 ENCOUNTER — Ambulatory Visit
Admission: RE | Admit: 2021-12-20 | Discharge: 2021-12-20 | Disposition: A | Discharge status: Home / Self Care | Payer: Medicare PPO | Source: Ambulatory Visit | Attending: Family Medicine | Admitting: Family Medicine

## 2021-12-20 DIAGNOSIS — R911 Solitary pulmonary nodule: Secondary | ICD-10-CM | POA: Diagnosis present

## 2021-12-20 DIAGNOSIS — Z87891 Personal history of nicotine dependence: Secondary | ICD-10-CM | POA: Diagnosis present

## 2021-12-23 ENCOUNTER — Telehealth: Payer: Self-pay | Admitting: Acute Care

## 2021-12-23 ENCOUNTER — Other Ambulatory Visit: Payer: Self-pay | Admitting: Orthopedic Surgery

## 2021-12-23 ENCOUNTER — Ambulatory Visit: Payer: Medicare PPO | Admitting: Obstetrics

## 2021-12-23 ENCOUNTER — Encounter: Payer: Self-pay | Admitting: Obstetrics

## 2021-12-23 ENCOUNTER — Other Ambulatory Visit: Payer: Self-pay

## 2021-12-23 ENCOUNTER — Encounter: Payer: Self-pay | Admitting: Orthopedic Surgery

## 2021-12-23 VITALS — BP 120/60 | Ht 71.0 in | Wt 225.0 lb

## 2021-12-23 DIAGNOSIS — N9089 Other specified noninflammatory disorders of vulva and perineum: Secondary | ICD-10-CM

## 2021-12-23 DIAGNOSIS — N3946 Mixed incontinence: Secondary | ICD-10-CM | POA: Diagnosis not present

## 2021-12-23 DIAGNOSIS — Z87891 Personal history of nicotine dependence: Secondary | ICD-10-CM

## 2021-12-23 DIAGNOSIS — Z01818 Encounter for other preprocedural examination: Secondary | ICD-10-CM

## 2021-12-23 DIAGNOSIS — Z122 Encounter for screening for malignant neoplasm of respiratory organs: Secondary | ICD-10-CM

## 2021-12-23 DIAGNOSIS — F17210 Nicotine dependence, cigarettes, uncomplicated: Secondary | ICD-10-CM

## 2021-12-23 MED ORDER — TRIAMCINOLONE ACETONIDE 0.5 % EX OINT: 1.0000 "application " | TOPICAL_OINTMENT | Freq: Two times a day (BID) | CUTANEOUS | 0 refills | Status: DC

## 2021-12-23 MED ORDER — OXYBUTYNIN CHLORIDE ER 5 MG PO TB24: 5.0000 mg | ORAL_TABLET | Freq: Every day | ORAL | 1 refills | Status: DC

## 2021-12-23 NOTE — Patient Instructions (Addendum)
Have a great year! Please call with any concerns. Don't forget to wear your seatbelt everyday! If you are not signed up on MyChart, please ask [place] how to sign up for it!   In a world where you can be anything, please be kind.   Body mass index is 31.38 kg/m.  A Healthy Lifestyle: Care Instructions Your Care Instructions  A healthy lifestyle can help you feel good, stay at a healthy weight, and have plenty of energy for both work and play. A healthy lifestyle is something you can share with your whole family. A healthy lifestyle also can lower your risk for serious health problems, such as high blood pressure, heart disease, and diabetes. You can follow a few steps listed below to improve your health and the health of your family. Follow-up care is a key part of your treatment and safety. Be sure to make and go to all appointments, and call your doctor if you are having problems. It's also a good idea to know your test results and keep a list of the medicines you take. How can you care for yourself at home? Do not eat too much sugar, fat, or fast foods. You can still have dessert and treats now and then. The goal is moderation. Start small to improve your eating habits. Pay attention to portion sizes, drink less juice and soda pop, and eat more fruits and vegetables. Eat a healthy amount of food. A 3-ounce serving of meat, for example, is about the size of a deck of cards. Fill the rest of your plate with vegetables and whole grains. Limit the amount of soda and sports drinks you have every day. Drink more water when you are thirsty. Eat at least 5 servings of fruits and vegetables every day. It may seem like a lot, but it is not hard to reach this goal. A serving or helping is 1 piece of fruit, 1 cup of vegetables, or 2 cups of leafy, raw vegetables. Have an apple or some carrot sticks as an afternoon snack instead of a candy bar. Try to have fruits and/or vegetables at every meal. Make exercise  part of your daily routine. You may want to start with simple activities, such as walking, bicycling, or slow swimming. Try to be active 30 to 60 minutes every day. You do not need to do all 30 to 60 minutes all at once. For example, you can exercise 3 times a day for 10 or 20 minutes. Moderate exercise is safe for most people, but it is always a good idea to talk to your doctor before starting an exercise program. Keep moving. Mow the lawn, work in the garden, or [employer]. Take the stairs instead of the elevator at work. If you smoke, quit. People who smoke have an increased risk for heart attack, stroke, cancer, and other lung illnesses. Quitting is hard, but there are ways to boost your chance of quitting tobacco for good. Use nicotine gum, patches, or lozenges. Ask your doctor about stop-smoking programs and medicines. Keep trying. In addition to reducing your risk of diseases in the future, you will notice some benefits soon after you stop using tobacco. If you have shortness of breath or asthma symptoms, they will likely get better within a few weeks after you quit. Limit how much alcohol you drink. Moderate amounts of alcohol (up to 2 drinks a day for men, 1 drink a day for women) are okay. But drinking too much can lead to  liver problems, high blood pressure, and other health problems. Family health If you have a family, there are many things you can do together to improve your health. Eat meals together as a family as often as possible. Eat healthy foods. This includes fruits, vegetables, lean meats and dairy, and whole grains. Include your family in your fitness plan. Most people think of activities such as jogging or tennis as the way to fitness, but there are many ways you and your family can be more active. Anything that makes you breathe hard and gets your heart pumping is exercise. Here are some tips: Walk to do errands or to take your child to school or the bus. Go for a family  bike ride after dinner instead of watching TV. Care instructions adapted under license by your healthcare professional. This care instruction is for use with your licensed healthcare professional. If you have questions about a medical condition or this instruction, always ask your healthcare professional. [place] any warranty or liability for your use of this information.

## 2021-12-23 NOTE — Progress Notes (Signed)
Chief Complaint  Patient presents with   Vaginal Exam    Vulvar rash but currently not present, vaginal dryness    HPI: Patient Linda Bradford is an 66 y.o. year old G2P2002 No LMP recorded. Patient is postmenopausal. Patient presents for presents for evaluation of a vulvar irritation. She is referred from her her PCP [name], FNP. Per primary, there is past history of vaginitis treated with hormonal cream and patient has declined topical candida treatment as she states she used OTC monitstat with no improvement. The area is not present today. She reports she has used topical estrogen in the past.  When the area comes up it can be irritated and can be painful. Skin then feels dry and will feel like it is cracking. She does have some issues with incontinence. This is incontinence with stress and with urgency.  Patient denies current abnormal vaginal discharge.  Patient with PMH of HTN, DM, hyperlipidemia, skin cancer. A1c well controlled 6.6. Patient is scheduled for knee surgery 6/12. She is a smoker 1/5 ppd x 45 years.   Review of Systems  Constitutional:  Negative for activity change, appetite change, chills, diaphoresis, fatigue, fever and unexpected weight change.  HENT:  Negative for congestion, dental problem, drooling, ear discharge, ear pain, facial swelling, hearing loss, mouth sores, nosebleeds, postnasal drip, rhinorrhea, sinus pressure, sinus pain, sneezing, sore throat, tinnitus, trouble swallowing and voice change.   Eyes:  Negative for photophobia, pain, discharge, redness, itching and visual disturbance.  Respiratory:  Negative for apnea, cough, choking, chest tightness, shortness of breath, wheezing and stridor.   Cardiovascular:  Negative for chest pain, palpitations and leg swelling.  Gastrointestinal:  Negative for abdominal distention, abdominal pain, anal bleeding, blood in stool, constipation, diarrhea, nausea, rectal pain and vomiting.  Endocrine: Negative for cold  intolerance, heat intolerance, polydipsia, polyphagia and polyuria.  Genitourinary:  Negative for decreased urine volume, difficulty urinating, dyspareunia, dysuria, enuresis, flank pain, frequency, genital sores, hematuria, menstrual problem, pelvic pain, urgency, vaginal bleeding, vaginal discharge and vaginal pain.  Musculoskeletal:  Negative for arthralgias, back pain, gait problem, joint swelling, myalgias, neck pain and neck stiffness.  Skin:  Negative for color change, pallor, rash and wound.  Allergic/Immunologic: Negative for environmental allergies, food allergies and immunocompromised state.  Neurological:  Negative for dizziness, tremors, seizures, syncope, facial asymmetry, speech difficulty, weakness, light-headedness, numbness and headaches.  Hematological:  Negative for adenopathy. Does not bruise/bleed easily.  Psychiatric/Behavioral:  Negative for behavioral problems, confusion, decreased concentration, dysphoric mood, hallucinations, self-injury, sleep disturbance and suicidal ideas. The patient is not nervous/anxious and is not hyperactive.     Past Medical History:  Diagnosis Date   Anxiety    Arthritis    Cancer ([place])    skin cancer, removed   COPD (chronic obstructive pulmonary disease) ([place])    Depression    Diabetes mellitus without complication ([place])    Dysrhythmia    occasional pvc's. not being treated.   Fatigue    GERD (gastroesophageal reflux disease)    Headache    Hypertension    Hypothyroidism    Thyroid disease    Tremors of nervous system    Past Surgical History:  Procedure Laterality Date   APPENDECTOMY     CHOLECYSTECTOMY N/A 07/01/2019   Procedure: LAPAROSCOPIC CHOLECYSTECTOMY;  Surgeon: Olean Ree, MD;  Location: [facility];  Service: General;  Laterality: N/A;   COLECTOMY WITH COLOSTOMY CREATION/HARTMANN PROCEDURE N/A 09/03/2018   Error, this was not done.   COLONOSCOPY WITH PROPOFOL  N/A 03/08/2019   Procedure: COLONOSCOPY WITH PROPOFOL;   Surgeon: Virgel Manifold, MD;  Location: [facility];  Service: Endoscopy;  Laterality: N/A;   CYSTOSCOPY WITH STENT PLACEMENT Bilateral 09/03/2018   Procedure: CYSTOSCOPY WITH STENT PLACEMENT-LIGHTED STENTS;  Surgeon: Hollice Espy, MD;  Location: [facility];  Service: Urology;  Laterality: Bilateral;   LAPAROSCOPIC SIGMOID COLECTOMY N/A 09/03/2018   Procedure: LAPAROSCOPIC SIGMOID COLECTOMY;  Surgeon: Olean Ree, MD;  Location: [facility];  Service: General;  Laterality: N/A;   percutaneous drainage tube  07/2018   2nd tube placed. not healing in colon, causing a fistula   TONSILLECTOMY     TOTAL KNEE ARTHROPLASTY Left 05/10/2021   Procedure: TOTAL KNEE ARTHROPLASTY;  Surgeon: Lovell Sheehan, MD;  Location: [facility];  Service: Orthopedics;  Laterality: Left;   TUBAL LIGATION     Family History  Problem Relation Age of Onset   Lung cancer Mother    Alcohol abuse Sister    Arthritis Sister    Alcohol abuse Brother    Heart disease Brother 26   Heart attack Brother    Pancreatic cancer Maternal Aunt    Diabetes Maternal Aunt    Alcohol abuse Son    Drug abuse Son    Drug abuse Son    Heart disease Father 21   Diabetes Sister    Breast cancer Brother    Alcohol abuse Maternal Uncle    Alcohol abuse Maternal Grandmother    Diabetes Maternal Grandmother    Social History   Socioeconomic History   Marital status: Married    Spouse name: Clair Gulling   Number of children: 2   Years of education: Not on file   Highest education level: Not on file  Occupational History   Occupation: Careers information officer    Comment: currently employed  Tobacco Use   Smoking status: Every Day    Packs/day: 1.50    Years: 45.00    Pack years: 67.50    Types: Cigarettes    Start date: 03/02/1980   Smokeless tobacco: Never  Vaping Use   Vaping Use: Former   Devices: only for a month. did not like  Substance and Sexual Activity   Alcohol use: No    Alcohol/week: 0.0 standard drinks   Drug use: No    Sexual activity: Not Currently  Other Topics Concern   Not on file  Social History Narrative   Live with hubby at home   Social Determinants of Health   Financial Resource Strain: Not on file  Food Insecurity: Not on file  Transportation Needs: Not on file  Physical Activity: Not on file  Stress: Not on file  Social Connections: Not on file  Intimate Partner Violence: Not on file   Medicine list and allergies reviewed and updated.    Objective:  BP 120/60   Ht '5\' 11"'$  (1.803 m)   Wt 225 lb (102.1 kg)   BMI 31.38 kg/m  Physical Exam Vitals and nursing note reviewed. Exam conducted with a chaperone present.  Constitutional:      General: She is not in acute distress.    Appearance: Normal appearance. She is not ill-appearing.  HENT:     Head: Normocephalic and atraumatic.     Mouth/Throat:     Mouth: Mucous membranes are moist.     Pharynx: No oropharyngeal exudate.  Eyes:     Extraocular Movements: Extraocular movements intact.  Neck:     Thyroid: No thyromegaly.  Cardiovascular:  Rate and Rhythm: Normal rate.  Pulmonary:     Effort: Pulmonary effort is normal.  Chest:     Chest wall: No mass.  Breasts:    Breasts are symmetrical.     Right: Normal. No mass, nipple discharge, skin change or tenderness.     Left: Normal. No mass, nipple discharge, skin change or tenderness.  Genitourinary:    Exam position: Lithotomy position.     Labia:        Right: No rash, tenderness or lesion.        Left: No rash, tenderness or lesion.      Urethra: No prolapse or urethral lesion.     Cervix: No discharge.     Rectum: No tenderness or external hemorrhoid. Normal anal tone.     Comments: External genitalia - atrophic with normal hair distribution, no lesions noted Urethral meatus - no prolapse, no lesions noted  Urethra - no tenderness or scarring noted, no masses noted  Bladder - normal, no masses or tenderness noted Vagina -  atrophic with decreased rugae, no  discharge, no lesions noted, pelvic relaxation  Skin:    General: Skin is warm and dry.  Neurological:     General: No focal deficit present.     Mental Status: She is alert and oriented to person, place, and time. Mental status is at baseline.     Coordination: Coordination normal.     Deep Tendon Reflexes: Reflexes normal.  Psychiatric:        Mood and Affect: Mood normal.        Behavior: Behavior normal.        Thought Content: Thought content normal.        Judgment: Judgment normal.     Assessment/Plan:   Vulvar irritation - Plan: triamcinolone ointment (KENALOG) 0.5 %  Mixed incontinence - Plan: oxybutynin (DITROPAN-XL) 5 MG 24 hr tablet  Currently no symptoms, skin appears atrophic with no to minimal appearance of lichen sclerosis. Symptoms however seem consistent with lichen. Will plan for treatment prn with triamcinolone. Recommend water to rinse and aquaphor. Options for incontinence discussed with patient. Discussed urology consult for urodynamics to see if she would best benefit from medication for urgency or a sling for stress urinary incontinence. Discussed OTC Oxytrol. Patient desires trial of oxybutynin at this time. S/p CT Lung Mammo as been ordered Knee surgery pending - pt wanted to address these concerns prior.   Return in about 6 weeks (around 02/03/2022) for follow up.

## 2021-12-23 NOTE — Telephone Encounter (Signed)
Spoke with patient by phone, using two identifiers, to review results of LDCT.  Area of concerns has resolved which is great news.  Plan to return to yearly lung scan.  Patient reported she  had a 'cold' prior to last scan.  No suspicious findings on this exam.  Patient acknowledged understanding.  Results faxed to PCP and new order placed for annual scan 2024.

## 2021-12-23 NOTE — H&P (Signed)
Linda Bradford MRN:  [id number] DOB/SEX:  29-Aug-1955/female  CHIEF COMPLAINT:  Painful right Knee  HISTORY: Patient is a 66 y.o. female presented with a history of pain in the right knee. Onset of symptoms was gradual starting several years ago with gradually worsening course since that time. Prior procedures on the knee include none. Patient has been treated conservatively with over-the-counter NSAIDs and activity modification. Patient currently rates pain in the knee at 10 out of 10 with activity. There is pain at night.  PAST MEDICAL HISTORY: Patient Active Problem List   Diagnosis Date Noted   Vulvar rash 12/07/2021   LVH (left ventricular hypertrophy) 10/24/2021   Aortic regurgitation 37/85/8850   Systolic murmur 27/74/1287   Thrush of mouth and esophagus ([place]) 10/01/2021   Skin yeast infection 10/01/2021   Vaginal yeast infection 10/01/2021   Current chronic use of inhaled steroid 10/01/2021   Chronic pain syndrome 10/01/2021   Mixed hyperlipidemia 10/01/2021   Nodule of left lung 09/20/2021   Hyperlipidemia associated with type 2 diabetes mellitus ([place]) 09/02/2021   Type 2 diabetes mellitus with hyperglycemia, without long-term current use of insulin ([place]) 09/02/2021   Hypertension associated with type 2 diabetes mellitus ([place]) 09/02/2021   Vaginal cyst 09/02/2021   COPD with exacerbation ([place]) 09/02/2021   S/P total knee replacement, left 05/10/2021   Chronic obstructive pulmonary disease with acute exacerbation ([place]) 12/31/2019   Cough 12/31/2019   Biliary dyskinesia    Diverticulitis of large intestine with abscess 05/27/2018   Diabetes mellitus without complication ([place]) 86/76/7209   Lateral epicondylitis 07/07/2017   Personal history of tobacco use, presenting hazards to health 05/03/2016   Bronchitis, chronic ([place]) 03/03/2015   Anxiety, generalized 11/05/2014   Alcohol abuse, in remission 11/05/2014   Nicotine addiction 11/05/2014   H/O: osteoarthritis 11/05/2014    Fibromyalgia 11/05/2014   Restless leg syndrome 11/05/2014   Arthralgia of hip 01/13/2014   Adiposity 01/13/2014   Disordered sleep 01/13/2014   Airway hyperreactivity 12/17/2013   Adult hypothyroidism 12/17/2013   Current tobacco use 12/17/2013   Basal cell carcinoma 11/04/2013   Arthropathia 02/08/2012   Arthritis of knee, left 02/08/2012   Past Medical History:  Diagnosis Date   Anxiety    Arthritis    Cancer ([place])    skin cancer, removed   COPD (chronic obstructive pulmonary disease) ([place])    Depression    Diabetes mellitus without complication ([place])    Dysrhythmia    occasional pvc's. not being treated.   Fatigue    GERD (gastroesophageal reflux disease)    Headache    Hypertension    Hypothyroidism    Thyroid disease    Tremors of nervous system    Past Surgical History:  Procedure Laterality Date   APPENDECTOMY     CHOLECYSTECTOMY N/A 07/01/2019   Procedure: LAPAROSCOPIC CHOLECYSTECTOMY;  Surgeon: Olean Ree, MD;  Location: [facility] ORS;  Service: General;  Laterality: N/A;   COLECTOMY WITH COLOSTOMY CREATION/HARTMANN PROCEDURE N/A 09/03/2018   Error, this was not done.   COLONOSCOPY WITH PROPOFOL N/A 03/08/2019   Procedure: COLONOSCOPY WITH PROPOFOL;  Surgeon: Virgel Manifold, MD;  Location: [facility] END[facility]COPY;  Service: Endoscopy;  Laterality: N/A;   CYSTOSCOPY WITH STENT PLACEMENT Bilateral 09/03/2018   Procedure: CYSTOSCOPY WITH STENT PLACEMENT-LIGHTED STENTS;  Surgeon: Hollice Espy, MD;  Location: [facility] ORS;  Service: Urology;  Laterality: Bilateral;   LAPAROSCOPIC SIGMOID COLECTOMY N/A 09/03/2018   Procedure: LAPAROSCOPIC SIGMOID COLECTOMY;  Surgeon: Olean Ree, MD;  Location: [facility] OR[facility];  Service:  General;  Laterality: N/A;   percutaneous drainage tube  07/2018   2nd tube placed. not healing in colon, causing a fistula   TONSILLECTOMY     TOTAL KNEE ARTHROPLASTY Left 05/10/2021   Procedure: TOTAL KNEE ARTHROPLASTY;  Surgeon: Lovell Sheehan, MD;  Location:  [facility] ORS;  Service: Orthopedics;  Laterality: Left;   TUBAL LIGATION       MEDICATIONS:  (Not in a hospital admission)   ALLERGIES:   Allergies  Allergen Reactions   Dexilant [Dexlansoprazole] Nausea Only and Other (See Comments)    Dizziness    REVIEW OF SYSTEMS:  Pertinent items are noted in HPI.   FAMILY HISTORY:   Family History  Problem Relation Age of Onset   Lung cancer Mother    Alcohol abuse Sister    Arthritis Sister    Alcohol abuse Brother    Heart disease Brother 50   Heart attack Brother    Pancreatic cancer Maternal Aunt    Diabetes Maternal Aunt    Alcohol abuse Son    Drug abuse Son    Drug abuse Son    Heart disease Father 14   Diabetes Sister    Breast cancer Brother    Alcohol abuse Maternal Uncle    Alcohol abuse Maternal Grandmother    Diabetes Maternal Grandmother     SOCIAL HISTORY:   Social History   Tobacco Use   Smoking status: Every Day    Packs/day: 1.50    Years: 45.00    Pack years: 67.50    Types: Cigarettes    Start date: 03/02/1980   Smokeless tobacco: Never  Substance Use Topics   Alcohol use: No    Alcohol/week: 0.0 standard drinks     EXAMINATION:  Vital signs in last 24 hours: '@VSRANGES'$ @  General appearance: alert, cooperative, and no distress Neck: no JVD and supple, symmetrical, trachea midline  Musculoskeletal:  ROM 0-90, Ligaments intact,  Imaging Review Plain radiographs demonstrate severe degenerative joint disease of the right knee. The overall alignment is significant varus. The bone quality appears to be good for age and reported activity level.  Assessment/Plan: Primary osteoarthritis, right knee   The patient history, physical examination and imaging studies are consistent with advanced degenerative joint disease of the right knee. The patient has failed conservative treatment.  The clearance notes were reviewed.  After discussion with the patient it was felt that Total Knee Replacement was  indicated. The procedure,  risks, and benefits of total knee arthroplasty were presented and reviewed. The risks including but not limited to aseptic loosening, infection, blood clots, vascular injury, stiffness, patella tracking problems complications among others were discussed. The patient acknowledged the explanation, agreed to proceed with the plan.  Carlynn Spry 12/23/2021, 3:21 PM

## 2021-12-24 ENCOUNTER — Other Ambulatory Visit: Payer: Self-pay

## 2021-12-24 ENCOUNTER — Encounter
Admission: RE | Admit: 2021-12-24 | Discharge: 2021-12-24 | Disposition: A | Discharge status: Home / Self Care | Payer: Medicare PPO | Source: Ambulatory Visit | Attending: Orthopedic Surgery | Admitting: Orthopedic Surgery

## 2021-12-24 VITALS — BP 150/60 | HR 73 | Resp 18 | Ht 71.0 in | Wt 226.6 lb

## 2021-12-24 DIAGNOSIS — Z01818 Encounter for other preprocedural examination: Secondary | ICD-10-CM | POA: Diagnosis present

## 2021-12-24 DIAGNOSIS — Z01812 Encounter for preprocedural laboratory examination: Secondary | ICD-10-CM

## 2021-12-24 HISTORY — DX: Cardiac murmur, unspecified: R01.1

## 2021-12-24 HISTORY — DX: Dyspnea, unspecified: R06.00

## 2021-12-24 HISTORY — DX: Anemia, unspecified: D64.9

## 2021-12-24 HISTORY — DX: Pneumonia, unspecified organism: J18.9

## 2021-12-24 LAB — BASIC METABOLIC PANEL
Anion gap: 9 (ref 5–15)
BUN: 14 mg/dL (ref 8–23)
CO2: 28 mmol/L (ref 22–32)
Calcium: 9.5 mg/dL (ref 8.9–10.3)
Chloride: 98 mmol/L (ref 98–111)
Creatinine, Ser: 0.67 mg/dL (ref 0.44–1.00)
GFR, Estimated: >60 mL/min (ref >60)
Glucose, Bld: 134 mg/dL — ABNORMAL HIGH (ref 70–99)
Potassium: 4.8 mmol/L (ref 3.5–5.1)
Sodium: 135 mmol/L (ref 135–145)

## 2021-12-24 LAB — CBC
HCT: 42.5 % (ref 36.0–46.0)
Hemoglobin: 13.8 g/dL (ref 12.0–15.0)
MCH: 30.9 pg (ref 26.0–34.0)
MCHC: 32.5 g/dL (ref 30.0–36.0)
MCV: 95.1 fL (ref 80.0–100.0)
Platelets: 247 10*3/uL (ref 150–400)
RBC: 4.47 MIL/uL (ref 3.87–5.11)
RDW: 12.7 % (ref 11.5–15.5)
WBC: 8 10*3/uL (ref 4.0–10.5)
nRBC: 0 % (ref 0.0–0.2)

## 2021-12-24 LAB — URINALYSIS, ROUTINE W REFLEX MICROSCOPIC
Bilirubin Urine: NEGATIVE
Glucose, UA: NEGATIVE mg/dL
Hgb urine dipstick: NEGATIVE
Ketones, ur: NEGATIVE mg/dL
Leukocytes,Ua: NEGATIVE
Nitrite: NEGATIVE
Protein, ur: NEGATIVE mg/dL
Specific Gravity, Urine: 1.003 — ABNORMAL LOW (ref 1.005–1.030)
pH: 6 (ref 5.0–8.0)

## 2021-12-24 LAB — SURGICAL PCR SCREEN
MRSA, PCR: NEGATIVE
Staphylococcus aureus: NEGATIVE

## 2021-12-24 NOTE — Patient Instructions (Addendum)
Your procedure is scheduled on: 01/10/22 - Monday Report to the Registration Desk on the 1st floor of the [facility]. To find out your arrival time, please call [phone] between 1PM - 3PM on: 01/07/22 - Friday If your arrival time is 6:00 am, do not arrive prior to that time as the [place] [facility] entrance doors do not open until 6:00 am.  REMEMBER: Instructions that are not followed completely may result in serious medical risk, up to and including death; or upon the discretion of your surgeon and anesthesiologist your surgery may need to be rescheduled.  Do not eat food after midnight the night before surgery.  No gum chewing, lozengers or hard candies.  You may however, drink CLEAR liquids up to 2 hours before you are scheduled to arrive for your surgery. Do not drink anything within 2 hours of your scheduled arrival time.  Clear liquids include: - water   TAKE THESE MEDICATIONS THE MORNING OF SURGERY WITH A SIP OF WATER:  - buPROPion (WELLBUTRIN XL) 150 MG 24 hr tablet - DULoxetine (CYMBALTA) 60 MG capsule - fluticasone (FLONASE) 50 MCG/ACT nasal spray - Fluticasone-Umeclidin-Vilant (TRELEGY ELLIPTA) 200-62.5-25 MCG/ACT AEPB - gabapentin (NEURONTIN) 300 MG  - levothyroxine (SYNTHROID) 75 MCG tablet - metaxalone (SKELAXIN) 800 MG tablet - metoprolol succinate (TOPROL-XL) 50 MG 24 hr tablet - primidone (MYSOLINE) 50 MG tablet - theophylline (THEO-24) 300 MG 24 hr capsule  Use albuterol (VENTOLIN HFA) 108 (90 Base) MCG/ACT inhaler on the day of surgery and bring to the hospital.  - Stop taking metFORMIN (GLUCOPHAGE-XR) 500 MG 24 hr tablet beginning 0/6/10/23, may resume taking the day after surgery.   Follow recommendations from Cardiologist, Pulmonologist or PCP regarding stopping Aspirin, Coumadin, Plavix, Eliquis, Pradaxa, or Pletal. Stop taking beginning 01/04/22.  Stop taking beginning 01/02/22, One week prior to surgery: meloxicam (MOBIC) 15 MG tablet Stop  Anti-inflammatories (NSAIDS) such as Advil, Aleve, Ibuprofen, Motrin, Naproxen, Naprosyn and Aspirin based products such as Excedrin, Goodys Powder, BC Powder.  Stop ANY OVER THE COUNTER supplements until after surgery .   You may however, continue to take Tylenol if needed for pain up until the day of surgery.  No Alcohol for 24 hours before or after surgery.  No Smoking including e-cigarettes for 24 hours prior to surgery.  No chewable tobacco products for at least 6 hours prior to surgery.  No nicotine patches on the day of surgery.  Do not use any "recreational" drugs for at least a week prior to your surgery.  Please be advised that the combination of cocaine and anesthesia may have negative outcomes, up to and including death. If you test positive for cocaine, your surgery will be cancelled.  On the morning of surgery brush your teeth with toothpaste and water, you may rinse your mouth with mouthwash if you wish. Do not swallow any toothpaste or mouthwash.  Use CHG Soap or wipes as directed on instruction sheet.  Do not wear jewelry, make-up, hairpins, clips or nail polish.  Do not wear lotions, powders, or perfumes.   Do not shave body from the neck down 48 hours prior to surgery just in case you cut yourself which could leave a site for infection.  Also, freshly shaved skin may become irritated if using the CHG soap.  Contact lenses, hearing aids and dentures may not be worn into surgery.  Do not bring valuables to the hospital. [facility] is not responsible for any missing/lost belongings or valuables.   Notify your doctor  if there is any change in your medical condition (cold, fever, infection).  Wear comfortable clothing (specific to your surgery type) to the hospital.  After surgery, you can help prevent lung complications by doing breathing exercises.  Take deep breaths and cough every 1-2 hours. Your doctor may order a device called an Incentive Spirometer to help  you take deep breaths. When coughing or sneezing, hold a pillow firmly against your incision with both hands. This is called "splinting." Doing this helps protect your incision. It also decreases belly discomfort.  If you are being admitted to the hospital overnight, leave your suitcase in the car. After surgery it may be brought to your room.  If you are being discharged the day of surgery, you will not be allowed to drive home. You will need a responsible adult (18 years or older) to drive you home and stay with you that night.   If you are taking public transportation, you will need to have a responsible adult (18 years or older) with you. Please confirm with your physician that it is acceptable to use public transportation.   Please call the [facility] Dept. at [phone] if you have any questions about these instructions.  Surgery Visitation Policy:  Patients undergoing a surgery or procedure may have two family members or support persons with them as long as the person is not COVID-19 positive or experiencing its symptoms.   Inpatient Visitation:    Visiting hours are 7 a.m. to 8 p.m. Up to four visitors are allowed at one time in a patient room, including children. The visitors may rotate out with other people during the day. One designated support person (adult) may remain overnight.

## 2021-12-27 ENCOUNTER — Other Ambulatory Visit: Payer: Medicare PPO

## 2021-12-27 ENCOUNTER — Other Ambulatory Visit: Payer: Self-pay | Admitting: Family Medicine

## 2021-12-27 DIAGNOSIS — E119 Type 2 diabetes mellitus without complications: Secondary | ICD-10-CM

## 2021-12-29 ENCOUNTER — Inpatient Hospital Stay: Admission: RE | Admit: 2021-12-29 | Payer: Medicare PPO | Source: Ambulatory Visit

## 2022-01-10 ENCOUNTER — Observation Stay: Payer: Medicare PPO

## 2022-01-10 ENCOUNTER — Ambulatory Visit: Payer: Medicare PPO | Admitting: Certified Registered"

## 2022-01-10 ENCOUNTER — Encounter
Admission: AD | Disposition: A | Discharge status: Home Health | Payer: Self-pay | Source: Home / Self Care | Attending: Orthopedic Surgery

## 2022-01-10 ENCOUNTER — Ambulatory Visit: Payer: Medicare PPO | Admitting: Urgent Care

## 2022-01-10 ENCOUNTER — Inpatient Hospital Stay
Admission: AD | Admit: 2022-01-10 | Discharge: 2022-01-13 | DRG: 470 | Disposition: A | Discharge status: Home Health | Payer: Medicare PPO | Attending: Orthopedic Surgery | Admitting: Orthopedic Surgery

## 2022-01-10 ENCOUNTER — Ambulatory Visit: Payer: Medicare PPO

## 2022-01-10 ENCOUNTER — Other Ambulatory Visit: Payer: Self-pay

## 2022-01-10 ENCOUNTER — Encounter: Payer: Self-pay | Admitting: Orthopedic Surgery

## 2022-01-10 DIAGNOSIS — F32A Depression, unspecified: Secondary | ICD-10-CM | POA: Diagnosis present

## 2022-01-10 DIAGNOSIS — Z85828 Personal history of other malignant neoplasm of skin: Secondary | ICD-10-CM

## 2022-01-10 DIAGNOSIS — Z96651 Presence of right artificial knee joint: Secondary | ICD-10-CM

## 2022-01-10 DIAGNOSIS — I10 Essential (primary) hypertension: Secondary | ICD-10-CM | POA: Diagnosis present

## 2022-01-10 DIAGNOSIS — F419 Anxiety disorder, unspecified: Secondary | ICD-10-CM | POA: Diagnosis present

## 2022-01-10 DIAGNOSIS — E039 Hypothyroidism, unspecified: Secondary | ICD-10-CM | POA: Diagnosis present

## 2022-01-10 DIAGNOSIS — Z01812 Encounter for preprocedural laboratory examination: Secondary | ICD-10-CM

## 2022-01-10 DIAGNOSIS — M1711 Unilateral primary osteoarthritis, right knee: Principal | ICD-10-CM | POA: Diagnosis present

## 2022-01-10 DIAGNOSIS — M797 Fibromyalgia: Secondary | ICD-10-CM | POA: Diagnosis present

## 2022-01-10 DIAGNOSIS — E119 Type 2 diabetes mellitus without complications: Secondary | ICD-10-CM | POA: Diagnosis present

## 2022-01-10 DIAGNOSIS — K219 Gastro-esophageal reflux disease without esophagitis: Secondary | ICD-10-CM | POA: Diagnosis present

## 2022-01-10 DIAGNOSIS — Z888 Allergy status to other drugs, medicaments and biological substances status: Secondary | ICD-10-CM

## 2022-01-10 DIAGNOSIS — J449 Chronic obstructive pulmonary disease, unspecified: Secondary | ICD-10-CM | POA: Diagnosis present

## 2022-01-10 HISTORY — PX: TOTAL KNEE ARTHROPLASTY: SHX125

## 2022-01-10 LAB — GLUCOSE, CAPILLARY
Glucose-Capillary: 177 mg/dL — ABNORMAL HIGH (ref 70–99)
Glucose-Capillary: 192 mg/dL — ABNORMAL HIGH (ref 70–99)

## 2022-01-10 LAB — TYPE AND SCREEN
ABO/RH(D): O POS
Antibody Screen: NEGATIVE

## 2022-01-10 SURGERY — ARTHROPLASTY, KNEE, TOTAL
Anesthesia: Spinal | Site: Knee | Laterality: Right

## 2022-01-10 MED ORDER — SODIUM CHLORIDE 0.9 % IV SOLN: INTRAVENOUS | Status: DC

## 2022-01-10 MED ORDER — CEFAZOLIN SODIUM-DEXTROSE 2-4 GM/100ML-% IV SOLN
2.0000 g | INTRAVENOUS | Status: AC
  Administered 2022-01-10: 2 g via INTRAVENOUS

## 2022-01-10 MED ORDER — DOCUSATE SODIUM 100 MG PO CAPS
100.0000 mg | ORAL_CAPSULE | Freq: Two times a day (BID) | ORAL | Status: DC
  Administered 2022-01-10 – 2022-01-13 (×6): 100 mg via ORAL
  Filled 2022-01-10 (×6): qty 1

## 2022-01-10 MED ORDER — BUPIVACAINE LIPOSOME 1.3 % IJ SUSP
INTRAMUSCULAR | Status: AC
  Filled 2022-01-10: qty 20

## 2022-01-10 MED ORDER — 0.9 % SODIUM CHLORIDE (POUR BTL) OPTIME
TOPICAL | Status: DC | PRN
  Administered 2022-01-10: 1000 mL

## 2022-01-10 MED ORDER — METOCLOPRAMIDE HCL 10 MG PO TABS: 5.0000 mg | ORAL_TABLET | Freq: Three times a day (TID) | ORAL | Status: DC | PRN

## 2022-01-10 MED ORDER — DOCUSATE SODIUM 100 MG PO CAPS: 100.0000 mg | ORAL_CAPSULE | Freq: Two times a day (BID) | ORAL | Status: DC

## 2022-01-10 MED ORDER — OXYBUTYNIN CHLORIDE ER 5 MG PO TB24
5.0000 mg | ORAL_TABLET | Freq: Every day | ORAL | Status: DC
  Administered 2022-01-11 – 2022-01-12 (×2): 5 mg via ORAL
  Filled 2022-01-10 (×3): qty 1

## 2022-01-10 MED ORDER — DIPHENHYDRAMINE HCL 12.5 MG/5ML PO ELIX: 12.5000 mg | ORAL_SOLUTION | ORAL | Status: DC | PRN

## 2022-01-10 MED ORDER — PRIMIDONE 50 MG PO TABS
50.0000 mg | ORAL_TABLET | Freq: Two times a day (BID) | ORAL | Status: DC
  Administered 2022-01-10 – 2022-01-12 (×5): 50 mg via ORAL
  Filled 2022-01-10 (×6): qty 1

## 2022-01-10 MED ORDER — PROPOFOL 10 MG/ML IV BOLUS
INTRAVENOUS | Status: DC | PRN
  Administered 2022-01-10: 120 ug/kg/min via INTRAVENOUS
  Administered 2022-01-10: 70 mg via INTRAVENOUS

## 2022-01-10 MED ORDER — CEFAZOLIN SODIUM-DEXTROSE 2-4 GM/100ML-% IV SOLN
INTRAVENOUS | Status: AC
  Filled 2022-01-10: qty 100

## 2022-01-10 MED ORDER — THEOPHYLLINE ER 200 MG PO CP24
300.0000 mg | ORAL_CAPSULE | Freq: Every day | ORAL | Status: DC
  Filled 2022-01-10: qty 1

## 2022-01-10 MED ORDER — ACETAMINOPHEN 10 MG/ML IV SOLN: 1000.0000 mg | Freq: Once | INTRAVENOUS | Status: DC | PRN

## 2022-01-10 MED ORDER — ONDANSETRON HCL 4 MG/2ML IJ SOLN
INTRAMUSCULAR | Status: DC | PRN
  Administered 2022-01-10: 4 mg via INTRAVENOUS

## 2022-01-10 MED ORDER — FAMOTIDINE 20 MG PO TABS
20.0000 mg | ORAL_TABLET | Freq: Once | ORAL | Status: AC
  Administered 2022-01-10: 20 mg via ORAL

## 2022-01-10 MED ORDER — PROPOFOL 1000 MG/100ML IV EMUL
INTRAVENOUS | Status: AC
  Filled 2022-01-10: qty 100

## 2022-01-10 MED ORDER — UMECLIDINIUM BROMIDE 62.5 MCG/ACT IN AEPB
1.0000 | INHALATION_SPRAY | Freq: Every day | RESPIRATORY_TRACT | Status: DC
  Administered 2022-01-11 – 2022-01-13 (×3): 1 via RESPIRATORY_TRACT
  Filled 2022-01-10: qty 7

## 2022-01-10 MED ORDER — CHLORHEXIDINE GLUCONATE 0.12 % MT SOLN
OROMUCOSAL | Status: AC
  Filled 2022-01-10: qty 15

## 2022-01-10 MED ORDER — ORAL CARE MOUTH RINSE: 15.0000 mL | Freq: Once | OROMUCOSAL | Status: AC

## 2022-01-10 MED ORDER — ALUM & MAG HYDROXIDE-SIMETH 200-200-20 MG/5ML PO SUSP
30.0000 mL | ORAL | Status: DC | PRN
  Administered 2022-01-10 – 2022-01-11 (×2): 30 mL via ORAL
  Filled 2022-01-10 (×2): qty 30

## 2022-01-10 MED ORDER — IPRATROPIUM-ALBUTEROL 0.5-2.5 (3) MG/3ML IN SOLN: 3.0000 mL | Freq: Four times a day (QID) | RESPIRATORY_TRACT | Status: DC | PRN

## 2022-01-10 MED ORDER — BUPROPION HCL ER (XL) 150 MG PO TB24
150.0000 mg | ORAL_TABLET | Freq: Every day | ORAL | Status: DC
Start: 2022-01-10 — End: 2022-01-10

## 2022-01-10 MED ORDER — BUPIVACAINE-EPINEPHRINE (PF) 0.5% -1:200000 IJ SOLN
INTRAMUSCULAR | Status: AC
  Filled 2022-01-10: qty 30

## 2022-01-10 MED ORDER — METAXALONE 800 MG PO TABS
800.0000 mg | ORAL_TABLET | Freq: Three times a day (TID) | ORAL | Status: DC
  Administered 2022-01-10 – 2022-01-13 (×9): 800 mg via ORAL
  Filled 2022-01-10 (×10): qty 1

## 2022-01-10 MED ORDER — POVIDONE-IODINE 10 % EX SWAB: 2.0000 "application " | Freq: Once | CUTANEOUS | Status: DC

## 2022-01-10 MED ORDER — BUPIVACAINE HCL (PF) 0.5 % IJ SOLN
INTRAMUSCULAR | Status: DC | PRN
  Administered 2022-01-10: 2.3 mL via INTRATHECAL

## 2022-01-10 MED ORDER — FLUTICASONE FUROATE-VILANTEROL 200-25 MCG/ACT IN AEPB
1.0000 | INHALATION_SPRAY | Freq: Every day | RESPIRATORY_TRACT | Status: DC
  Administered 2022-01-11 – 2022-01-13 (×3): 1 via RESPIRATORY_TRACT
  Filled 2022-01-10: qty 28

## 2022-01-10 MED ORDER — MIDAZOLAM HCL 5 MG/5ML IJ SOLN
INTRAMUSCULAR | Status: DC | PRN
  Administered 2022-01-10: 2 mg via INTRAVENOUS

## 2022-01-10 MED ORDER — DEXAMETHASONE SODIUM PHOSPHATE 10 MG/ML IJ SOLN
INTRAMUSCULAR | Status: DC | PRN
  Administered 2022-01-10: 5 mg via INTRAVENOUS

## 2022-01-10 MED ORDER — LIDOCAINE HCL (CARDIAC) PF 100 MG/5ML IV SOSY
PREFILLED_SYRINGE | INTRAVENOUS | Status: DC | PRN
  Administered 2022-01-10: 40 mg via INTRAVENOUS

## 2022-01-10 MED ORDER — BUPIVACAINE HCL (PF) 0.5 % IJ SOLN
INTRAMUSCULAR | Status: DC | PRN
  Administered 2022-01-10: 15 mL via PERINEURAL

## 2022-01-10 MED ORDER — TRANEXAMIC ACID-NACL 1000-0.7 MG/100ML-% IV SOLN
INTRAVENOUS | Status: AC
  Filled 2022-01-10: qty 100

## 2022-01-10 MED ORDER — SODIUM CHLORIDE 0.9 % IR SOLN
Status: DC | PRN
  Administered 2022-01-10: 3000 mL

## 2022-01-10 MED ORDER — LEVOTHYROXINE SODIUM 75 MCG PO TABS
75.0000 ug | ORAL_TABLET | Freq: Every day | ORAL | Status: DC
  Administered 2022-01-11 – 2022-01-13 (×3): 75 ug via ORAL
  Filled 2022-01-10 (×3): qty 1

## 2022-01-10 MED ORDER — PHENYLEPHRINE HCL (PRESSORS) 10 MG/ML IV SOLN
INTRAVENOUS | Status: DC | PRN
  Administered 2022-01-10: 80 ug via INTRAVENOUS
  Administered 2022-01-10: 160 ug via INTRAVENOUS

## 2022-01-10 MED ORDER — LIDOCAINE HCL (PF) 2 % IJ SOLN
INTRAMUSCULAR | Status: AC
  Filled 2022-01-10: qty 5

## 2022-01-10 MED ORDER — BISACODYL 10 MG RE SUPP
10.0000 mg | Freq: Every day | RECTAL | Status: DC | PRN
  Administered 2022-01-11: 10 mg via RECTAL
  Filled 2022-01-10: qty 1

## 2022-01-10 MED ORDER — EPHEDRINE SULFATE (PRESSORS) 50 MG/ML IJ SOLN
INTRAMUSCULAR | Status: DC | PRN
  Administered 2022-01-10 (×2): 10 mg via INTRAVENOUS
  Administered 2022-01-10: 5 mg via INTRAVENOUS

## 2022-01-10 MED ORDER — HYDROXYZINE HCL 10 MG PO TABS
10.0000 mg | ORAL_TABLET | Freq: Three times a day (TID) | ORAL | Status: DC | PRN
Start: 2022-01-10 — End: 2022-01-13

## 2022-01-10 MED ORDER — ACETAMINOPHEN 10 MG/ML IV SOLN
INTRAVENOUS | Status: DC | PRN
  Administered 2022-01-10: 1000 mg via INTRAVENOUS

## 2022-01-10 MED ORDER — DEXAMETHASONE SODIUM PHOSPHATE 4 MG/ML IJ SOLN
INTRAMUSCULAR | Status: DC | PRN
  Administered 2022-01-10: 2 mg via PERINEURAL

## 2022-01-10 MED ORDER — METOCLOPRAMIDE HCL 5 MG/ML IJ SOLN
5.0000 mg | Freq: Three times a day (TID) | INTRAMUSCULAR | Status: DC | PRN
  Administered 2022-01-12: 10 mg via INTRAVENOUS
  Filled 2022-01-10: qty 2

## 2022-01-10 MED ORDER — DEXAMETHASONE SODIUM PHOSPHATE 10 MG/ML IJ SOLN
INTRAMUSCULAR | Status: AC
Start: 2022-01-10 — End: ?
  Filled 2022-01-10: qty 1

## 2022-01-10 MED ORDER — VITAMIN B-12 1000 MCG PO TABS
1000.0000 ug | ORAL_TABLET | Freq: Every day | ORAL | Status: DC
  Administered 2022-01-10 – 2022-01-13 (×4): 1000 ug via ORAL
  Filled 2022-01-10 (×4): qty 1

## 2022-01-10 MED ORDER — DEXAMETHASONE SODIUM PHOSPHATE 10 MG/ML IJ SOLN
INTRAMUSCULAR | Status: AC
  Filled 2022-01-10: qty 1

## 2022-01-10 MED ORDER — ASPIRIN 81 MG PO CHEW
81.0000 mg | CHEWABLE_TABLET | Freq: Two times a day (BID) | ORAL | Status: DC
  Administered 2022-01-10 – 2022-01-13 (×6): 81 mg via ORAL
  Filled 2022-01-10 (×6): qty 1

## 2022-01-10 MED ORDER — ALBUTEROL SULFATE (2.5 MG/3ML) 0.083% IN NEBU
3.0000 mL | INHALATION_SOLUTION | RESPIRATORY_TRACT | Status: DC | PRN
Start: 2022-01-10 — End: 2022-01-13

## 2022-01-10 MED ORDER — BUPIVACAINE HCL (PF) 0.5 % IJ SOLN
INTRAMUSCULAR | Status: AC
  Filled 2022-01-10: qty 10

## 2022-01-10 MED ORDER — ONDANSETRON HCL 4 MG/2ML IJ SOLN: 4.0000 mg | Freq: Once | INTRAMUSCULAR | Status: DC | PRN

## 2022-01-10 MED ORDER — MORPHINE SULFATE (PF) 2 MG/ML IV SOLN
0.5000 mg | INTRAVENOUS | Status: DC | PRN
  Administered 2022-01-10 (×3): 1 mg via INTRAVENOUS
  Filled 2022-01-10 (×3): qty 1

## 2022-01-10 MED ORDER — ACETAMINOPHEN 325 MG PO TABS: 325.0000 mg | ORAL_TABLET | Freq: Four times a day (QID) | ORAL | Status: DC | PRN

## 2022-01-10 MED ORDER — BUPIVACAINE HCL (PF) 0.5 % IJ SOLN
INTRAMUSCULAR | Status: AC
  Filled 2022-01-10: qty 60

## 2022-01-10 MED ORDER — KETOROLAC TROMETHAMINE 15 MG/ML IJ SOLN
7.5000 mg | Freq: Four times a day (QID) | INTRAMUSCULAR | Status: AC
  Administered 2022-01-10 – 2022-01-11 (×4): 7.5 mg via INTRAVENOUS
  Filled 2022-01-10 (×4): qty 1

## 2022-01-10 MED ORDER — ACETAMINOPHEN 10 MG/ML IV SOLN
INTRAVENOUS | Status: AC
  Administered 2022-01-10: 1000 mg via INTRAVENOUS
  Filled 2022-01-10: qty 100

## 2022-01-10 MED ORDER — HYDROCODONE-ACETAMINOPHEN 7.5-325 MG PO TABS
1.0000 | ORAL_TABLET | ORAL | Status: DC | PRN
  Administered 2022-01-10 – 2022-01-12 (×4): 2 via ORAL
  Filled 2022-01-10 (×5): qty 2

## 2022-01-10 MED ORDER — OXYCODONE HCL 5 MG PO TABS: 5.0000 mg | ORAL_TABLET | Freq: Once | ORAL | Status: DC | PRN

## 2022-01-10 MED ORDER — BUPROPION HCL ER (XL) 150 MG PO TB24
150.0000 mg | ORAL_TABLET | Freq: Every day | ORAL | Status: DC
  Administered 2022-01-11 – 2022-01-13 (×3): 150 mg via ORAL
  Filled 2022-01-10 (×3): qty 1

## 2022-01-10 MED ORDER — PHENYLEPHRINE 80 MCG/ML (10ML) SYRINGE FOR IV PUSH (FOR BLOOD PRESSURE SUPPORT)
PREFILLED_SYRINGE | INTRAVENOUS | Status: AC
  Filled 2022-01-10: qty 10

## 2022-01-10 MED ORDER — DULOXETINE HCL 60 MG PO CPEP
60.0000 mg | ORAL_CAPSULE | Freq: Two times a day (BID) | ORAL | Status: DC
  Administered 2022-01-10 – 2022-01-13 (×6): 60 mg via ORAL
  Filled 2022-01-10 (×6): qty 1

## 2022-01-10 MED ORDER — BENZONATATE 100 MG PO CAPS: 200.0000 mg | ORAL_CAPSULE | Freq: Three times a day (TID) | ORAL | Status: DC | PRN

## 2022-01-10 MED ORDER — MIDAZOLAM HCL 2 MG/2ML IJ SOLN
INTRAMUSCULAR | Status: AC
Start: 2022-01-10 — End: ?
  Filled 2022-01-10: qty 2

## 2022-01-10 MED ORDER — FAMOTIDINE 20 MG PO TABS
ORAL_TABLET | ORAL | Status: AC
  Filled 2022-01-10: qty 1

## 2022-01-10 MED ORDER — SODIUM CHLORIDE (PF) 0.9 % IJ SOLN
INTRAMUSCULAR | Status: DC | PRN
  Administered 2022-01-10: 90 mL via INTRAMUSCULAR

## 2022-01-10 MED ORDER — SURGIRINSE WOUND IRRIGATION SYSTEM - OPTIME
TOPICAL | Status: DC | PRN
  Administered 2022-01-10: 900 mL

## 2022-01-10 MED ORDER — SODIUM CHLORIDE FLUSH 0.9 % IV SOLN
INTRAVENOUS | Status: AC
  Filled 2022-01-10: qty 40

## 2022-01-10 MED ORDER — HYDROCODONE-ACETAMINOPHEN 5-325 MG PO TABS
1.0000 | ORAL_TABLET | ORAL | Status: DC | PRN
  Administered 2022-01-11 – 2022-01-12 (×4): 2 via ORAL
  Administered 2022-01-13: 1 via ORAL
  Filled 2022-01-10 (×2): qty 2
  Filled 2022-01-10: qty 1
  Filled 2022-01-10 (×2): qty 2

## 2022-01-10 MED ORDER — LACTATED RINGERS IV SOLN: INTRAVENOUS | Status: DC

## 2022-01-10 MED ORDER — PHENOL 1.4 % MT LIQD: 1.0000 | OROMUCOSAL | Status: DC | PRN

## 2022-01-10 MED ORDER — LOPERAMIDE HCL 2 MG PO CAPS: 2.0000 mg | ORAL_CAPSULE | Freq: Four times a day (QID) | ORAL | Status: DC | PRN

## 2022-01-10 MED ORDER — ONDANSETRON HCL 4 MG/2ML IJ SOLN
INTRAMUSCULAR | Status: AC
  Filled 2022-01-10: qty 2

## 2022-01-10 MED ORDER — PHENYLEPHRINE HCL-NACL 20-0.9 MG/250ML-% IV SOLN
INTRAVENOUS | Status: AC
  Filled 2022-01-10: qty 250

## 2022-01-10 MED ORDER — FENTANYL CITRATE (PF) 100 MCG/2ML IJ SOLN: 25.0000 ug | INTRAMUSCULAR | Status: DC | PRN

## 2022-01-10 MED ORDER — STERILE WATER FOR INJECTION IJ SOLN
INTRAMUSCULAR | Status: AC
  Filled 2022-01-10: qty 10

## 2022-01-10 MED ORDER — TRANEXAMIC ACID-NACL 1000-0.7 MG/100ML-% IV SOLN
1000.0000 mg | INTRAVENOUS | Status: AC
  Administered 2022-01-10: 1000 mg via INTRAVENOUS

## 2022-01-10 MED ORDER — MENTHOL 3 MG MT LOZG: 1.0000 | LOZENGE | OROMUCOSAL | Status: DC | PRN

## 2022-01-10 MED ORDER — CHLORHEXIDINE GLUCONATE 0.12 % MT SOLN
15.0000 mL | Freq: Once | OROMUCOSAL | Status: AC
  Administered 2022-01-10: 15 mL via OROMUCOSAL

## 2022-01-10 MED ORDER — ONDANSETRON HCL 4 MG PO TABS: 4.0000 mg | ORAL_TABLET | Freq: Four times a day (QID) | ORAL | Status: DC | PRN

## 2022-01-10 MED ORDER — ONDANSETRON HCL 4 MG/2ML IJ SOLN
4.0000 mg | Freq: Four times a day (QID) | INTRAMUSCULAR | Status: DC | PRN
  Administered 2022-01-10: 4 mg via INTRAVENOUS
  Filled 2022-01-10: qty 2

## 2022-01-10 MED ORDER — ROSUVASTATIN CALCIUM 10 MG PO TABS
10.0000 mg | ORAL_TABLET | Freq: Every day | ORAL | Status: DC
  Administered 2022-01-11 – 2022-01-13 (×3): 10 mg via ORAL
  Filled 2022-01-10 (×3): qty 1

## 2022-01-10 MED ORDER — PHENYLEPHRINE HCL-NACL 20-0.9 MG/250ML-% IV SOLN
INTRAVENOUS | Status: DC | PRN
  Administered 2022-01-10: 26.667 ug/min via INTRAVENOUS

## 2022-01-10 MED ORDER — METFORMIN HCL ER 500 MG PO TB24
1000.0000 mg | ORAL_TABLET | Freq: Every day | ORAL | Status: DC
  Administered 2022-01-10 – 2022-01-12 (×3): 1000 mg via ORAL
  Filled 2022-01-10 (×3): qty 2

## 2022-01-10 MED ORDER — METOPROLOL SUCCINATE ER 25 MG PO TB24
50.0000 mg | ORAL_TABLET | Freq: Every day | ORAL | Status: DC
  Administered 2022-01-11 – 2022-01-13 (×3): 50 mg via ORAL
  Filled 2022-01-10 (×3): qty 2

## 2022-01-10 MED ORDER — EPHEDRINE 5 MG/ML INJ
INTRAVENOUS | Status: AC
  Filled 2022-01-10: qty 5

## 2022-01-10 MED ORDER — FLUTICASONE PROPIONATE 50 MCG/ACT NA SUSP
2.0000 | Freq: Every day | NASAL | Status: DC
  Administered 2022-01-11 – 2022-01-13 (×3): 2 via NASAL
  Filled 2022-01-10: qty 16

## 2022-01-10 MED ORDER — CEFAZOLIN SODIUM-DEXTROSE 2-4 GM/100ML-% IV SOLN
2.0000 g | Freq: Four times a day (QID) | INTRAVENOUS | Status: AC
  Administered 2022-01-10 (×2): 2 g via INTRAVENOUS
  Filled 2022-01-10 (×2): qty 100

## 2022-01-10 MED ORDER — OXYCODONE HCL 5 MG/5ML PO SOLN: 5.0000 mg | Freq: Once | ORAL | Status: DC | PRN

## 2022-01-10 MED ORDER — GABAPENTIN 300 MG PO CAPS
300.0000 mg | ORAL_CAPSULE | Freq: Every day | ORAL | Status: DC
  Administered 2022-01-10 – 2022-01-12 (×3): 300 mg via ORAL
  Filled 2022-01-10 (×3): qty 1

## 2022-01-10 MED ORDER — TRAZODONE HCL 100 MG PO TABS
200.0000 mg | ORAL_TABLET | Freq: Every evening | ORAL | Status: DC | PRN
  Administered 2022-01-10 – 2022-01-13 (×3): 200 mg via ORAL
  Filled 2022-01-10 (×3): qty 2

## 2022-01-10 SURGICAL SUPPLY — 65 items
BASEPLATE TIBIAL RT SZ 5 (Knees) IMPLANT
BLADE SAGITTAL AGGR TOOTH XLG (BLADE) ×2 IMPLANT
BLADE SAW SAG 25X90X1.19 (BLADE) ×2 IMPLANT
BOWL CEMENT MIX W/ADAPTER (MISCELLANEOUS) ×2 IMPLANT
BRUSH SCRUB EZ  4% CHG (MISCELLANEOUS) ×1
BRUSH SCRUB EZ 4% CHG (MISCELLANEOUS) ×1 IMPLANT
CEMENT BONE 40GM (Cement) ×3 IMPLANT
CHLORAPREP W/TINT 26 (MISCELLANEOUS) ×4 IMPLANT
COMP PATELLA FENESIS 32 OVAL (Stem) ×2 IMPLANT
COMPONENT FEM OXINIUM RT SZ5 (Knees) ×1 IMPLANT
COMPONENT PTLLA GENS 32 OVAL (Stem) IMPLANT
COOLER POLAR GLACIER W/PUMP (MISCELLANEOUS) ×2 IMPLANT
CUFF TOURN SGL QUICK 34 (TOURNIQUET CUFF) ×1
CUFF TRNQT CYL 34X4.125X (TOURNIQUET CUFF) ×1 IMPLANT
DRAPE 3/4 80X56 (DRAPES) ×4 IMPLANT
DRAPE INCISE IOBAN 66X60 STRL (DRAPES) ×2 IMPLANT
ELECT REM PT RETURN 9FT ADLT (ELECTROSURGICAL) ×2
ELECTRODE REM PT RTRN 9FT ADLT (ELECTROSURGICAL) ×1 IMPLANT
GAUZE SPONGE 4X4 12PLY STRL (GAUZE/BANDAGES/DRESSINGS) ×2 IMPLANT
GAUZE XEROFORM 1X8 LF (GAUZE/BANDAGES/DRESSINGS) ×2 IMPLANT
GLOVE BIO SURGEON STRL SZ8 (GLOVE) ×2 IMPLANT
GLOVE BIOGEL PI IND STRL 8.5 (GLOVE) ×2 IMPLANT
GLOVE BIOGEL PI INDICATOR 8.5 (GLOVE) ×2
GLOVE SURG ORTHO 8.5 STRL (GLOVE) ×2 IMPLANT
GOWN STRL REUS W/ TWL LRG LVL3 (GOWN DISPOSABLE) ×1 IMPLANT
GOWN STRL REUS W/ TWL XL LVL3 (GOWN DISPOSABLE) ×1 IMPLANT
GOWN STRL REUS W/TWL LRG LVL3 (GOWN DISPOSABLE) ×1
GOWN STRL REUS W/TWL XL LVL3 (GOWN DISPOSABLE) ×1
HOOD PEEL AWAY FLYTE STAYCOOL (MISCELLANEOUS) ×4 IMPLANT
INSERT TIB XLPE 9 SZ 5-6 (Insert) ×1 IMPLANT
IV NS IRRIG 3000ML ARTHROMATIC (IV SOLUTION) ×2 IMPLANT
KIT TURNOVER KIT A (KITS) ×2 IMPLANT
MANIFOLD NEPTUNE II (INSTRUMENTS) ×2 IMPLANT
MAT ABSORB  FLUID 56X50 GRAY (MISCELLANEOUS) ×1
MAT ABSORB FLUID 56X50 GRAY (MISCELLANEOUS) ×1 IMPLANT
NDL SAFETY ECLIPSE 18X1.5 (NEEDLE) ×1 IMPLANT
NDL SPNL 20GX3.5 QUINCKE YW (NEEDLE) ×1 IMPLANT
NEEDLE HYPO 18GX1.5 SHARP (NEEDLE) ×1
NEEDLE SPNL 20GX3.5 QUINCKE YW (NEEDLE) ×2 IMPLANT
NS IRRIG 1000ML POUR BTL (IV SOLUTION) ×2 IMPLANT
PACK TOTAL KNEE (MISCELLANEOUS) ×2 IMPLANT
PAD ABD DERMACEA PRESS 5X9 (GAUZE/BANDAGES/DRESSINGS) ×1 IMPLANT
PAD CAST CTTN 4X4 STRL (SOFTGOODS) IMPLANT
PAD DE MAYO PRESSURE PROTECT (MISCELLANEOUS) ×2 IMPLANT
PAD WRAPON POLAR KNEE (MISCELLANEOUS) ×1 IMPLANT
PADDING CAST COTTON 4X4 STRL (SOFTGOODS) ×1
PENCIL ELECTRO HAND CTR (MISCELLANEOUS) ×1 IMPLANT
PULSAVAC PLUS IRRIG FAN TIP (DISPOSABLE) ×2
SOLUTION IRRIG SURGIPHOR (IV SOLUTION) ×2 IMPLANT
SOLUTION PRONTOSAN WOUND 350ML (IRRIGATION / IRRIGATOR) ×2 IMPLANT
STAPLER SKIN PROX 35W (STAPLE) ×2 IMPLANT
STOCKINETTE BIAS CUT 6 980064 (GAUZE/BANDAGES/DRESSINGS) ×1 IMPLANT
SUCTION FRAZIER HANDLE 10FR (MISCELLANEOUS) ×1
SUCTION TUBE FRAZIER 10FR DISP (MISCELLANEOUS) ×1 IMPLANT
SUT DVC 2 QUILL PDO  T11 36X36 (SUTURE) ×1
SUT DVC 2 QUILL PDO T11 36X36 (SUTURE) ×1 IMPLANT
SUT VIC AB 2-0 CT1 18 (SUTURE) ×2 IMPLANT
SUT VIC AB 2-0 CT1 27 (SUTURE)
SUT VIC AB 2-0 CT1 TAPERPNT 27 (SUTURE) IMPLANT
SUT VIC AB PLUS 45CM 1-MO-4 (SUTURE) ×2 IMPLANT
SYR 30ML LL (SYRINGE) ×6 IMPLANT
TIBIAL BASEPLATE SZ 5 (Knees) ×2 IMPLANT
TIP FAN IRRIG PULSAVAC PLUS (DISPOSABLE) ×1 IMPLANT
WATER STERILE IRR 500ML POUR (IV SOLUTION) ×2 IMPLANT
WRAPON POLAR PAD KNEE (MISCELLANEOUS) ×2

## 2022-01-10 NOTE — H&P (Signed)
The patient has been re-examined, and the chart reviewed, and there have been no interval changes to the documented history and physical.  Plan a right total knee today.  Anesthesia is consulted regarding a peripheral nerve block for post-operative pain.  The risks, benefits, and alternatives have been discussed at length, and the patient is willing to proceed.     

## 2022-01-10 NOTE — Evaluation (Signed)
Physical Therapy Evaluation Patient Details Name: Linda Bradford MRN: [id number] DOB: 04/24/1956 Today's Date: 01/10/2022  History of Present Illness  66 y/o female s/p R TKA on 01/10/22. PMH includes COPD, HTN, dysrhythmias, HA's, anxiety, DM, fibromyalgia, thyroid disease, and tremors of nervous system; L TKA in 05/2022.   Clinical Impression  Pt received supine in bed agreeable to therapy. 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 Pt performed bed mobility with SUP; STS and ambulation with CGA for safety, no physical assistance required. Did need VC to improve RLE clearance. Monitored O2 sats; w/o supplemental sats dropped to 86% seated EOB. Returned and maintained at >93% while on 2L O2 via [place]. O2 worn with all mobility in session. Completed PT treatment with performance of TKA exercise packet. Pain became too great; exercises ended prior to completion. Recommend pt d/c home with HHPT. Has all DME required from previous surgery. Would benefit from skilled PT to address above deficits and promote optimal return to PLOF.      Recommendations for follow up therapy are one component of a multi-disciplinary discharge planning process, led by the attending physician.  Recommendations may be updated based on patient status, additional functional criteria and insurance authorization.  Follow Up Recommendations Home health PT    Assistance Recommended at Discharge PRN  Patient can return home with the following  A little help with walking and/or transfers;A little help with bathing/dressing/bathroom;Help with stairs or ramp for entrance;Assist for transportation;Assistance with cooking/housework    Equipment Recommendations None recommended by PT (has from previous TKA)  Recommendations for Other Services       Functional Status Assessment Patient has had a recent decline in  their functional status and demonstrates the ability to make significant improvements in function in a reasonable and predictable amount of time.     Precautions / Restrictions Precautions Precautions: Fall Restrictions Weight Bearing Restrictions: Yes RLE Weight Bearing: Weight bearing as tolerated      Mobility  Bed Mobility Overal bed mobility: Needs Assistance Bed Mobility: Supine to Sit, Sit to Supine     Supine to sit: Supervision, HOB elevated Sit to supine: Supervision   General bed mobility comments: using bed rails    Transfers Overall transfer level: Needs assistance Equipment used: Rolling walker (2 wheels) Transfers: Sit to/from Stand Sit to Stand: From elevated surface, Min guard           General transfer comment: CGA for safety; unable to stand from bed in lowest position; able to stand w/o assist from elevated surface.    Ambulation/Gait Ambulation/Gait assistance: Min guard Gait Distance (Feet): 70 Feet Assistive device: Rolling walker (2 wheels) Gait Pattern/deviations: Step-through pattern, Decreased stance time - right Gait velocity: decreased     General Gait Details: occasional decreased RLE foot clearance; brief standing rest breaks in final 25 feet due to RLE fatigue and pain  Stairs            Wheelchair Mobility    Modified Rankin (Stroke Patients Only)       Balance Overall balance assessment: Needs assistance   Sitting balance-Leahy Scale: Normal       Standing balance-Leahy Scale: Fair Standing balance comment: no apparent LOB; does use RW to steady; weight shift onto LLE in static stance  Pertinent Vitals/Pain Pain Assessment Pain Assessment: 0-10 Pain Score: 9  Pain Location: right knee Pain Descriptors / Indicators: Aching, Grimacing Pain Intervention(s): Limited activity within patient's tolerance, Patient requesting pain meds-RN notified, Monitored during session, Ice  applied    Home Living Family/patient expects to be discharged to:: Private residence Living Arrangements: Spouse/significant other;Other relatives Available Help at Discharge: Friend(s);Available PRN/intermittently;Home health [facility] agency coming out to house QD for 6 weeks, hours unknown, to assist with caring for home and sister-in-law and husband.) Type of Home: House Home Access: Stairs to enter Entrance Stairs-Rails: Psychiatric nurse of Steps: 4   Home Layout: One level [place]: [place] - single point;Grab bars - tub/shower;Rolling Walker (2 wheels);Shower seat - built in;BSC/3in1 Additional Comments: 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 Pt has hired a home health care agency to come out to the ouse QD for the next 6 weeks to help with cleaning of home as well as caring for husband and SIL.    Prior Function Prior Level of Function : Independent/Modified Independent;Driving;History of Falls (last six months)             Mobility Comments: Independent with ADLs and IADLs. Drives. Reports 1 fall in the last 6 months.       Hand Dominance        Extremity/Trunk Assessment   Upper Extremity Assessment Upper Extremity Assessment: Overall WFL for tasks assessed    Lower Extremity Assessment Lower Extremity Assessment: RLE deficits/detail RLE Deficits / Details: performs SLR and full range ankle pumps with RLE; LLE strength WFL RLE Sensation: WNL       Communication   Communication: No difficulties  Cognition Arousal/Alertness: Awake/alert Behavior During Therapy: WFL for tasks assessed/performed Overall Cognitive Status: Within Functional Limits for tasks assessed                                 General Comments: A&Ox4        General Comments      Exercises Total Joint Exercises Ankle Circles/Pumps: AROM, Right, 10 reps, Supine Quad Sets: AROM, Strengthening,  Right, 10 reps, Supine Heel Slides: AROM, Strengthening, Right, 10 reps, Supine Hip ABduction/ADduction: AROM, Strengthening, Right, 10 reps, Supine Straight Leg Raises: AROM, Strengthening, Right, 5 reps, Supine   Assessment/Plan    PT Assessment Patient needs continued PT services  PT Problem List Decreased strength;Decreased range of motion;Decreased activity tolerance;Decreased balance;Decreased mobility;Pain       PT Treatment Interventions Balance training;Gait training;Neuromuscular re-education;Stair training;Functional mobility training;Patient/family education;Therapeutic activities;Therapeutic exercise    PT Goals (Current goals can be found in the Care Plan section)  Acute Rehab PT Goals Patient Stated Goal: to go home PT Goal Formulation: With patient Time For Goal Achievement: 01/24/22 Potential to Achieve Goals: Good    Frequency BID     Co-evaluation               AM-PAC PT "6 Clicks" Mobility  Outcome Measure Help needed turning from your back to your side while in a flat bed without using bedrails?: None Help needed moving from lying on your back to sitting on the side of a flat bed without using bedrails?: A Little Help needed moving to and from a bed to a chair (including a wheelchair)?: A Little Help needed standing up from a chair using your arms (e.g., wheelchair or bedside chair)?: A Little Help  needed to walk in hospital room?: A Little Help needed climbing 3-5 steps with a railing? : A Little 6 Click Score: 19    End of Session Equipment Utilized During Treatment: Gait belt Activity Tolerance: Patient tolerated treatment well;Patient limited by pain Patient left: in bed;with call bell/phone within reach;with bed alarm set;with nursing/sitter in room;with SCD's reapplied Nurse Communication: Mobility status;Patient requests pain meds PT Visit Diagnosis: Other abnormalities of gait and mobility (R26.89);Muscle weakness (generalized)  (M62.81);Difficulty in walking, not elsewhere classified (R26.2);Pain Pain - Right/Left: Right Pain - part of body: Knee    Time: 1350-1430 PT Time Calculation (min) (ACUTE ONLY): 40 min   Charges:   PT Evaluation $PT Eval Moderate Complexity: 1 Mod PT Treatments $Therapeutic Exercise: 8-22 mins $Therapeutic Activity: 8-22 mins        Patrina Levering PT, DPT

## 2022-01-10 NOTE — Anesthesia Preprocedure Evaluation (Signed)
Anesthesia Evaluation  Patient identified by MRN, date of birth, ID band Patient awake    Reviewed: Allergy & Precautions, NPO status , Patient's Chart, lab work & pertinent test results  History of Anesthesia Complications Negative for: history of anesthetic complications  Airway Mallampati: III  TM Distance: >3 FB Neck ROM: Full    Dental  (+) Lower Dentures, Upper Dentures   Pulmonary shortness of breath and with exertion, asthma , neg sleep apnea, COPD,  COPD inhaler, Current SmokerPatient did not abstain from smoking.,    Pulmonary exam normal breath sounds clear to auscultation       Cardiovascular Exercise Tolerance: Good METShypertension, Pt. on medications (-) CAD and (-) Past MI (-) dysrhythmias  Rhythm:Regular Rate:Normal - Systolic murmurs    Neuro/Psych  Headaches, PSYCHIATRIC DISORDERS Anxiety Depression  Neuromuscular disease    GI/Hepatic GERD  Medicated and Controlled,(+)     (-) substance abuse  ,   Endo/Other  diabetes  Renal/GU negative Renal ROS     Musculoskeletal  (+) Arthritis , Fibromyalgia -  Abdominal   Peds  Hematology   Anesthesia Other Findings Past Medical History: No date: Anemia No date: Anxiety No date: Arthritis No date: Cancer ([place])     Comment:  skin cancer, removed No date: COPD (chronic obstructive pulmonary disease) ([facility]) No date: Depression No date: Diabetes mellitus without complication ([facility]) No date: Dyspnea No date: Dysrhythmia     Comment:  occasional pvc's. not being treated. No date: Fatigue No date: GERD (gastroesophageal reflux disease) No date: Headache No date: Heart murmur No date: Hypertension No date: Hypothyroidism No date: Pneumonia No date: Thyroid disease No date: Tremors of nervous system  Reproductive/Obstetrics                             Anesthesia Physical Anesthesia Plan  ASA: 3  Anesthesia Plan: Spinal    Post-op Pain Management: Ofirmev IV (intra-op)*   Induction: Intravenous  PONV Risk Score and Plan: 1 and Ondansetron, Dexamethasone, Propofol infusion, TIVA and Midazolam  Airway Management Planned: Natural Airway  Additional Equipment: None  Intra-op Plan:   Post-operative Plan:   Informed Consent: I have reviewed the patients History and Physical, chart, labs and discussed the procedure including the risks, benefits and alternatives for the proposed anesthesia with the patient or authorized representative who has indicated his/her understanding and acceptance.       Plan Discussed with: CRNA and Surgeon  Anesthesia Plan Comments: (Discussed R/B/A of neuraxial anesthesia technique with patient: - rare risks of spinal/epidural hematoma, nerve damage, infection - Risk of PDPH - Risk of nausea and vomiting - Risk of conversion to general anesthesia and its associated risks, including sore throat, damage to lips/eyes/teeth/oropharynx, and rare risks such as cardiac and respiratory events. - Risk of allergic reactions  Discussed the role of CRNA in patient's perioperative care. Patient counseled on benefits of smoking cessation, and increased perioperative risks associated with continued smoking.  Patient voiced understanding.)        Anesthesia Quick Evaluation

## 2022-01-10 NOTE — Transfer of Care (Signed)
Immediate Anesthesia Transfer of Care Note  Patient: Marilyne Haseley  Procedure(s) Performed: TOTAL KNEE ARTHROPLASTY (Right: Knee)  Patient Location: PACU  Anesthesia Type:Spinal  Level of Consciousness: awake  Airway & Oxygen Therapy: Patient Spontanous Breathing and Patient connected to face mask oxygen  Post-op Assessment: Report given to RN and Post -op Vital signs reviewed and stable  Post vital signs: Reviewed and stable  Last Vitals:  Vitals Value Taken Time  BP 111/59 01/10/22 0922  Temp 35.9 0922  Pulse 72 01/10/22 0925  Resp 17 01/10/22 0925  SpO2 97 % 01/10/22 0925  Vitals shown include unvalidated device data.  Last Pain:  Vitals:   01/10/22 0622  TempSrc: Oral  PainSc: 4          Complications: No notable events documented.

## 2022-01-10 NOTE — Anesthesia Procedure Notes (Signed)
Anesthesia Regional Block: Femoral nerve block   Pre-Anesthetic Checklist: , timeout performed,  Correct Patient, Correct Site, Correct Laterality,  Correct Procedure, Correct Position, site marked,  Risks and benefits discussed,  Surgical consent,  Pre-op evaluation,  At surgeon's request and post-op pain management  Laterality: Lower and Right  Prep: chloraprep       Needles:  Injection technique: Single-shot  Needle Type: Echogenic Needle     Needle Length: 9cm  Needle Gauge: 21     Additional Needles:   Procedures:,,,, ultrasound used (permanent image in chart),,    Narrative:  Injection made incrementally with aspirations every 5 mL.  Performed by: Personally  Anesthesiologist: Arita Miss, MD  Additional Notes: Patient's chart reviewed and they were deemed appropriate candidate for procedure, per surgeon's request. Patient preoperatively educated about risks, benefits, and alternatives of the block including but not limited to: temporary or permanent nerve damage, bleeding, infection, damage to surround tissues, block failure, local anesthetic toxicity. Patient expressed understanding. A formal time-out was conducted consistent with institution rules.  Block performed post op in pacu. Monitors were applied, and no extra sedation used. Patient still under influence of spinal anesthetic. The site was prepped with skin prep and allowed to dry, and sterile gloves were used. A high frequency linear ultrasound probe with probe cover was utilized throughout. Femoral artery visualized at mid-thigh level, local anesthetic injected anterolateral to it, and echogenic block needle trajectory was monitored throughout. Hydrodissection of saphenous nerve visualized and appeared anatomically normal. Aspiration performed every 54m. Blood vessels were avoided. All injections were performed without resistance and free of blood and paresthesias. The patient tolerated the procedure  well.  Injectate: 240m0.3% bupivacaine + '2mg'$  decadron

## 2022-01-10 NOTE — Anesthesia Procedure Notes (Signed)
Spinal  Patient location during procedure: OR Start time: 01/10/2022 7:36 AM End time: 01/10/2022 7:42 AM Reason for block: surgical anesthesia Staffing Resident/CRNA: Cammie Sickle, CRNA Performed by: Cammie Sickle, CRNA Authorized by: Arita Miss, MD   Preanesthetic Checklist Completed: patient identified, IV checked, site marked, risks and benefits discussed, surgical consent, monitors and equipment checked, pre-op evaluation and timeout performed Spinal Block Patient position: sitting Prep: ChloraPrep Patient monitoring: heart rate, continuous pulse ox, blood pressure and cardiac monitor Approach: midline Location: L4-5 Injection technique: single-shot Needle Needle type: Introducer and Pencan  Needle gauge: 24 G Needle length: 10 cm Assessment Sensory level: T6 Events: CSF return Additional Notes Negative paresthesia. Negative blood return. Positive free-flowing CSF. Expiration date of kit checked and confirmed. Patient tolerated procedure well, without complications.

## 2022-01-10 NOTE — Op Note (Signed)
DATE OF SURGERY:  01/10/2022 TIME: 9:25 AM  PATIENT NAME:  Linda Bradford   AGE: 66 y.o.    PRE-OPERATIVE DIAGNOSIS:  M17.11 Unilateral primary osteoarthritis, right knee  POST-OPERATIVE DIAGNOSIS:  Same  PROCEDURE:  Procedure(s): TOTAL KNEE ARTHROPLASTY, RIGHT  SURGEON:  Lovell Sheehan, MD   ASSISTANT:  Carlynn Spry,  PA-C  OPERATIVE IMPLANTS: Tamala [employer]hew, Cruciate Retaining Oxinium Femoral component size  5, Fixed Bearing Tray size 5, Patella polyethylene 3-peg oval button size 32 mm, with a 9 mm DISHED insert.   PREOPERATIVE INDICATIONS:  Lauriann Milillo is an 66 y.o. female who has a diagnosis of M17.11 Unilateral primary osteoarthritis, right knee and elected for a total knee arthroplasty after failing nonoperative treatment, including activity modification, pain medication, physical therapy and injections who has significant impairment of their activities of daily living.  Radiographs have demonstrated tricompartmental osteoarthritis joint space narrowing, osteophytes, subchondral sclerosis and cyst formation.  The risks, benefits, and alternatives were discussed at length including but not limited to the risks of infection, bleeding, nerve or blood vessel injury, knee stiffness, fracture, dislocation, loosening or failure of the hardware and the need for further surgery. Medical risks include but not limited to DVT and pulmonary embolism, myocardial infarction, stroke, pneumonia, respiratory failure and death. I discussed these risks with the patient in my office prior to the date of surgery. They understood these risks and were willing to proceed.  OPERATIVE FINDINGS AND UNIQUE ASPECTS OF THE CASE:  All three compartments with advanced and severe degenerative changes, large osteophytes and an abundance of synovial fluid. Significant deformity was also noted. A decision was made to proceed with total knee arthroplasty.   OPERATIVE DESCRIPTION:  The patient was brought to the  operative room and placed in a supine position after undergoing placement of a general anesthetic. IV antibiotics were given. Patient received tranexamic acid. The lower extremity was prepped and draped in the usual sterile fashion.  A time out was performed to verify the patient's name, date of birth, medical record number, correct site of surgery and correct procedure to be performed. The timeout was also used to confirm the patient received antibiotics and that appropriate instruments, implants and radiographs studies were available in the room.  The leg was elevated and exsanguinated with an Esmarch and the tourniquet was inflated to 250 mmHg.  A midline incision was made over the left knee.. A medial parapatellar arthrotomy was then made and the patella subluxed laterally and the knee was brought into 90 of flexion. Hoffa's fat pad along with the anterior cruciate ligament was resected and the medial joint line was exposed.  Attention was then turned to preparation of the patella. The thickness of the patella was measured with a caliper, the diameter measured with the patella templates.  The patella resection was then made with an oscillating saw using the patella cutting guide.  The 32 mm button fit appropriately.  3 peg holes for the patella component were then drilled.  The extramedullary tibial cutting guide was then placed using the anterior tibial crest and second ray of the foot as a reference.  The tibial cutting guide was adjusted to allow for appropriate posterior slope.  The tibial cutting block was pinned into position. The slotted stylus was used to measure the proximal tibial resection of 9 mm off the high lateral side. Care was taken during the tibial resection to protect the medial and collateral ligaments.  The resected tibial bone was removed.  The  distal femur was resected using the intramedullary cutting guide.  Care was taken to protect the collateral ligaments during distal  femoral resection.  The distal femoral resection was performed with an oscillating saw. The femoral cutting guide was then removed. Extension gap was measured with a 9 mm spacer block and alignment and extension was confirmed using a long alignment rod. The femur was sized to be a 5. Rotation of the referencing guide was checked with the epicondylar axis and Whitesides line. Then the 4-in-1 cutting jig was then applied to the distal femur. A stylus was used to confirm that the anterior femur would not be notched.   Then the anterior, posterior and chamfer femoral cuts were then made with an oscillating saw.  The knee was distracted and all posterior osteophytes were removed.  The flexion gap was then measured with a flexion spacer block and long alignment rod and was found to be symmetric with the extension gap and perpendicular to mechanical axis of the tibia.  The proximal tibia plateau was then sized with trial trays. The best coverage was achieved with a size 5. This tibial tray was then pinned into position. The proximal tibia was then prepared with the keel punch.  After tibial preparation was completed, all trial components were inserted with polyethylene trials. The knee achieved full extension and flexed to 120 degrees. Ligament were stable to varus and valgus at full extension as well as 30, 60 and 90 degrees of flexion.   The trials were then placed. Knee was taken through a full range of motion and deemed to be stable with the trial components. All trial components were then removed.  The joint was copiously irrigated with pulse lavage.  The final total knee arthroplasty components were then cemented into place. The knee was held in extension while cement was allowed to cure.The knee was taken through a range of motion and the patella tracked well and the knee was again irrigated copiously.  The knee capsule was then injected with Exparel.  The medial arthrotomy was closed with #1 Vicryl and #2  Quill. The subcutaneous tissue closed with  2-0 vicryl, and skin approximated with staples.  A dry sterile and compressive dressing was applied.  A Polar Care was applied to the operative knee.  The patient was awakened and brought to the PACU in stable and satisfactory condition.  All sharp, lap and instrument counts were correct at the conclusion the case. I spoke with the patient's family in the postop consultation room to let them know the case had been performed without complication and the patient was stable in recovery room.   Total tourniquet time was 43 minutes.

## 2022-01-11 ENCOUNTER — Encounter: Payer: Self-pay | Admitting: Orthopedic Surgery

## 2022-01-11 MED ORDER — CHLORHEXIDINE GLUCONATE CLOTH 2 % EX PADS
6.0000 | MEDICATED_PAD | Freq: Every day | CUTANEOUS | Status: DC
  Administered 2022-01-11 – 2022-01-12 (×2): 6 via TOPICAL

## 2022-01-11 MED ORDER — THEOPHYLLINE ER 100 MG PO CP24
300.0000 mg | ORAL_CAPSULE | Freq: Every day | ORAL | Status: DC
  Administered 2022-01-11 – 2022-01-13 (×3): 300 mg via ORAL
  Filled 2022-01-11 (×3): qty 3

## 2022-01-11 NOTE — Anesthesia Postprocedure Evaluation (Signed)
Anesthesia Post Note  Patient: Linda Bradford  Procedure(s) Performed: TOTAL KNEE ARTHROPLASTY (Right: Knee)  Patient location during evaluation: Nursing Unit Anesthesia Type: Spinal Level of consciousness: oriented and awake and alert Pain management: pain level controlled Vital Signs Assessment: post-procedure vital signs reviewed and stable Respiratory status: spontaneous breathing and respiratory function stable Cardiovascular status: blood pressure returned to baseline and stable Postop Assessment: no headache, no backache, no apparent nausea or vomiting and patient able to bend at knees Anesthetic complications: no   No notable events documented.   Last Vitals:  Vitals:   01/10/22 2020 01/11/22 0344  BP: (!) 152/72 (!) 121/50  Pulse: 80 74  Resp: 16 16  Temp: 36.8 C 36.9 C  SpO2: 95% 92%    Last Pain:  Vitals:   01/11/22 0358  TempSrc:   PainSc: 6                  Amunique Neyra [address]

## 2022-01-11 NOTE — TOC Progression Note (Signed)
Transition of Care [facility]) - Progression Note    Patient Details  Name: Linda Bradford MRN: [id number] Date of Birth: 05/25/1956  [facility] CM/SW [place], RN Phone Number: 01/11/2022, 9:46 AM  Clinical Narrative:     [place] with [facility] accepted the patient for [facility] services       Expected Discharge Plan and Services                                                 Social Determinants of Health (SDOH) Interventions    Readmission Risk Interventions     No data to display

## 2022-01-11 NOTE — Progress Notes (Signed)
Physical Therapy Treatment Patient Details Name: Linda Bradford MRN: [id number] DOB: Dec 05, 1955 Today's Date: 01/11/2022   History of Present Illness 66 y/o female s/p R TKA on 01/10/22. PMH includes COPD, HTN, dysrhythmias, HA's, anxiety, DM, fibromyalgia, thyroid disease, and tremors of nervous system; L TKA in 05/2022.    PT Comments    Pt alert, though noted to be more sleepy this PM. Initially reported 5/10 in R knee and with stairs attempts 8/10. Sit <> Stand several times this session, CGA-supervision. Occasional cues for hand placement, RLE placement. She ambulated ~48f with RW and CGA, 2 standing rest breaks initiated by patient. Stairs also attempted modA due to R knee buckling. Able to re-attempt but still effortful and pt with difficulty understanding proper hand placement, needs to re-attempt prior to DC. The patient would benefit from further skilled PT intervention to continue to progress towards goals. Recommendation remains appropriate pending further progress with mobility.      Recommendations for follow up therapy are one component of a multi-disciplinary discharge planning process, led by the attending physician.  Recommendations may be updated based on patient status, additional functional criteria and insurance authorization.  Follow Up Recommendations  Home health PT     Assistance Recommended at Discharge PRN  Patient can return home with the following A little help with walking and/or transfers;A little help with bathing/dressing/bathroom;Help with stairs or ramp for entrance;Assist for transportation;Assistance with cooking/housework   Equipment Recommendations  None recommended by PT (has from previous TKA)    Recommendations for Other Services       Precautions / Restrictions Precautions Precautions: Fall Restrictions Weight Bearing Restrictions: Yes RLE Weight Bearing: Weight bearing as tolerated     Mobility  Bed Mobility Overal bed mobility: Needs  Assistance Bed Mobility: Supine to Sit     Supine to sit: Supervision, HOB elevated     General bed mobility comments: using bed rails    Transfers Overall transfer level: Needs assistance Equipment used: Rolling walker (2 wheels) Transfers: Sit to/from Stand Sit to Stand: Supervision, Min guard           General transfer comment: from elevated bed surface as well as from standard chair height    Ambulation/Gait Ambulation/Gait assistance: Min guard Gait Distance (Feet): 70 Feet Assistive device: Rolling walker (2 wheels) Gait Pattern/deviations: Step-through pattern, Decreased stance time - right, Step-to pattern Gait velocity: decreased     General Gait Details: pt limited by pain during ambulation, educated on step to gait pattern as needed   Stairs Stairs: Yes Stairs assistance: Mod assist, Min guard Stair Management: One rail Left, Step to pattern Number of Stairs: 2 General stair comments: on first step attempt, R knee buckling, modA to correct. Able to descend backwards, CGA. Second attempt, no buckling, but effortful for pt and difficult time lifting RLE from floor   Wheelchair Mobility    Modified Rankin (Stroke Patients Only)       Balance Overall balance assessment: Needs assistance   Sitting balance-Leahy Scale: Normal       Standing balance-Leahy Scale: Fair Standing balance comment: no apparent LOB; does use RW to steady; weight shift onto LLE in static stance                            Cognition Arousal/Alertness: Awake/alert Behavior During Therapy: WFL for tasks assessed/performed Overall Cognitive Status: Within Functional Limits for tasks assessed  Exercises Other Exercises Other Exercises: BSC with supervision, able to perform pericare without assist    General Comments        Pertinent Vitals/Pain Pain Assessment Pain Assessment: 0-10 Pain Score: 5   Pain Location: 5 initially, 8/10 with stairs attempt Pain Descriptors / Indicators: Aching, Grimacing, Guarding, Sore Pain Intervention(s): Limited activity within patient's tolerance, Monitored during session, Premedicated before session, Repositioned, Ice applied    Home Living                          Prior Function            PT Goals (current goals can now be found in the care plan section)      Frequency    BID      PT Plan Current plan remains appropriate    Co-evaluation              AM-PAC PT "6 Clicks" Mobility   Outcome Measure  Help needed turning from your back to your side while in a flat bed without using bedrails?: None Help needed moving from lying on your back to sitting on the side of a flat bed without using bedrails?: A Little Help needed moving to and from a bed to a chair (including a wheelchair)?: A Little Help needed standing up from a chair using your arms (e.g., wheelchair or bedside chair)?: A Little Help needed to walk in hospital room?: A Little Help needed climbing 3-5 steps with a railing? : A Little 6 Click Score: 19    End of Session Equipment Utilized During Treatment: Gait belt Activity Tolerance: Patient tolerated treatment well;Patient limited by pain Patient left: with call bell/phone within reach;with chair alarm set;in chair Nurse Communication: Mobility status PT Visit Diagnosis: Other abnormalities of gait and mobility (R26.89);Muscle weakness (generalized) (M62.81);Difficulty in walking, not elsewhere classified (R26.2);Pain Pain - Right/Left: Right Pain - part of body: Knee     Time: 7416-3845 PT Time Calculation (min) (ACUTE ONLY): 40 min  Charges:  $Gait Training: 8-22 mins $Therapeutic Activity: 23-37 mins                     Lieutenant Diego PT, DPT 3:53 PM,01/11/22

## 2022-01-11 NOTE — Progress Notes (Signed)
Physical Therapy Treatment Patient Details Name: Linda Bradford MRN: [id number] DOB: 07-09-1956 Today's Date: 01/11/2022   History of Present Illness 66 y/o female s/p R TKA on 01/10/22. PMH includes COPD, HTN, dysrhythmias, HA's, anxiety, DM, fibromyalgia, thyroid disease, and tremors of nervous system; L TKA in 05/2022.    PT Comments    Patient alert, agreeable to PT premedicated prior to session. Pt denied pain at rest, but with static standing with RLE weight bearing pt reported 3/10. Session focused on improving ambulation distance, pt still limited due to pain (~15f). Educated on step to gait pattern as well as proper BUE to maximize off weighting, sit <> stand technique. Pt able to perform transfers and gait with supervision, RW. Pt in recliner at end of session and set up for breakfast. The patient would benefit from further skilled PT intervention to continue to progress towards goals. Recommendation remains appropriate.      Recommendations for follow up therapy are one component of a multi-disciplinary discharge planning process, led by the attending physician.  Recommendations may be updated based on patient status, additional functional criteria and insurance authorization.  Follow Up Recommendations  Home health PT     Assistance Recommended at Discharge PRN  Patient can return home with the following A little help with walking and/or transfers;A little help with bathing/dressing/bathroom;Help with stairs or ramp for entrance;Assist for transportation;Assistance with cooking/housework   Equipment Recommendations  None recommended by PT (has from previous TKA)    Recommendations for Other Services       Precautions / Restrictions Precautions Precautions: Fall Restrictions Weight Bearing Restrictions: Yes RLE Weight Bearing: Weight bearing as tolerated     Mobility  Bed Mobility Overal bed mobility: Needs Assistance Bed Mobility: Supine to Sit     Supine to sit:  Supervision, HOB elevated     General bed mobility comments: using bed rails    Transfers Overall transfer level: Needs assistance Equipment used: Rolling walker (2 wheels) Transfers: Sit to/from Stand Sit to Stand: From elevated surface, Min guard           General transfer comment: from elevated bed surface as well as from standard chair height    Ambulation/Gait Ambulation/Gait assistance: Min guard Gait Distance (Feet): 90 Feet Assistive device: Rolling walker (2 wheels) Gait Pattern/deviations: Step-through pattern, Decreased stance time - right, Step-to pattern       General Gait Details: pt limited by pain during ambulation, educated on step to gait pattern as needed   Stairs             Wheelchair Mobility    Modified Rankin (Stroke Patients Only)       Balance Overall balance assessment: Needs assistance   Sitting balance-Leahy Scale: Normal       Standing balance-Leahy Scale: Fair Standing balance comment: no apparent LOB; does use RW to steady; weight shift onto LLE in static stance                            Cognition Arousal/Alertness: Awake/alert Behavior During Therapy: WFL for tasks assessed/performed Overall Cognitive Status: Within Functional Limits for tasks assessed                                          Exercises      General Comments  Pertinent Vitals/Pain Pain Assessment Pain Assessment: 0-10 Pain Score: 3  Pain Location: right knee with weight bearing Pain Descriptors / Indicators: Aching, Grimacing, Guarding, Sore Pain Intervention(s): Limited activity within patient's tolerance, Monitored during session, Repositioned, Premedicated before session, Ice applied    Home Living                          Prior Function            PT Goals (current goals can now be found in the care plan section) Progress towards PT goals: Progressing toward goals    Frequency     BID      PT Plan Current plan remains appropriate    Co-evaluation              AM-PAC PT "6 Clicks" Mobility   Outcome Measure  Help needed turning from your back to your side while in a flat bed without using bedrails?: None Help needed moving from lying on your back to sitting on the side of a flat bed without using bedrails?: A Little Help needed moving to and from a bed to a chair (including a wheelchair)?: A Little Help needed standing up from a chair using your arms (e.g., wheelchair or bedside chair)?: A Little Help needed to walk in hospital room?: A Little Help needed climbing 3-5 steps with a railing? : A Little 6 Click Score: 19    End of Session Equipment Utilized During Treatment: Gait belt Activity Tolerance: Patient tolerated treatment well;Patient limited by pain Patient left: with call bell/phone within reach;with chair alarm set;in chair Nurse Communication: Mobility status PT Visit Diagnosis: Other abnormalities of gait and mobility (R26.89);Muscle weakness (generalized) (M62.81);Difficulty in walking, not elsewhere classified (R26.2);Pain Pain - Right/Left: Right Pain - part of body: Knee     Time: 1610-9604 PT Time Calculation (min) (ACUTE ONLY): 26 min  Charges:  $Gait Training: 8-22 mins $Therapeutic Activity: 8-22 mins                    Lieutenant Diego PT, DPT 9:57 AM,01/11/22

## 2022-01-11 NOTE — Progress Notes (Signed)
  Subjective:  Patient reports pain as moderate.    Objective:   VITALS:   Vitals:   01/10/22 1238 01/10/22 1643 01/10/22 2020 01/11/22 0344  BP: (!) 126/59 (!) 161/68 (!) 152/72 (!) 121/50  Pulse: 70 79 80 74  Resp: '16 16 16 16  '$ Temp: 98.1 F (36.7 C) 97.8 F (36.6 C) 98.3 F (36.8 C) 98.4 F (36.9 C)  TempSrc:   Oral Oral  SpO2: (!) 89% 93% 95% 92%  Weight:      Height:        PHYSICAL EXAM:  Neurologically intact ABD soft Neurovascular intact Sensation intact distally Intact pulses distally Dorsiflexion/Plantar flexion intact Incision: drsg changed No cellulitis present Compartment soft   LABS  Results for orders placed or performed during the hospital encounter of 01/10/22 (from the past 24 hour(s))  Glucose, capillary     Status: Abnormal   Collection Time: 01/10/22  9:34 AM  Result Value Ref Range   Glucose-Capillary 192 (H) 70 - 99 mg/dL    DG Knee Right Port  Result Date: 01/10/2022 CLINICAL DATA:  Status post total right knee arthroplasty. EXAM: PORTABLE RIGHT KNEE - 1-2 VIEW COMPARISON:  None Available. FINDINGS: Status post total knee arthroplasty. No perihardware lucency is seen to indicate hardware failure or loosening. Expected postoperative changes including moderate joint effusion, intra-articular and subcutaneous air and soft tissue swelling. Anterior surgical skin staples. No acute fracture or dislocation. IMPRESSION: Status post total right knee arthroplasty without evidence of hardware failure. Electronically Signed   By: Yvonne Kendall M.D.   On: 01/10/2022 11:07   [place] OR NERVE BLOCK-IMAGE ONLY [facility])  Result Date: 01/10/2022 There is no interpretation for this exam.  This order is for images obtained during a surgical procedure.  Please See "Surgeries" Tab for more information regarding the procedure.    Assessment/Plan: 1 Day Post-Op   Principal Problem:   S/P TKR (total knee replacement) using cement, right   Advance diet Up with  therapy D/C foley Discharge today or tomorrow depending on PT goals   Carlynn Spry , PA-C 01/11/2022, 8:25 AM

## 2022-01-11 NOTE — Plan of Care (Signed)
  Problem: Education: Goal: Knowledge of General Education information will improve Description: Including pain rating scale, medication(s)/side effects and non-pharmacologic comfort measures Outcome: Progressing   Problem: Pain Managment: Goal: General experience of comfort will improve Outcome: Progressing   Problem: Safety: Goal: Ability to remain free from injury will improve Outcome: Progressing   Problem: Skin Integrity: Goal: Risk for impaired skin integrity will decrease Outcome: Progressing   Problem: Education: Goal: Knowledge of General Education information will improve Description: Including pain rating scale, medication(s)/side effects and non-pharmacologic comfort measures Outcome: Progressing

## 2022-01-12 DIAGNOSIS — E119 Type 2 diabetes mellitus without complications: Secondary | ICD-10-CM | POA: Diagnosis present

## 2022-01-12 DIAGNOSIS — F32A Depression, unspecified: Secondary | ICD-10-CM | POA: Diagnosis present

## 2022-01-12 DIAGNOSIS — Z96651 Presence of right artificial knee joint: Secondary | ICD-10-CM | POA: Diagnosis present

## 2022-01-12 DIAGNOSIS — E039 Hypothyroidism, unspecified: Secondary | ICD-10-CM | POA: Diagnosis present

## 2022-01-12 DIAGNOSIS — M1711 Unilateral primary osteoarthritis, right knee: Secondary | ICD-10-CM | POA: Diagnosis present

## 2022-01-12 DIAGNOSIS — I10 Essential (primary) hypertension: Secondary | ICD-10-CM | POA: Diagnosis present

## 2022-01-12 DIAGNOSIS — M797 Fibromyalgia: Secondary | ICD-10-CM | POA: Diagnosis present

## 2022-01-12 DIAGNOSIS — J449 Chronic obstructive pulmonary disease, unspecified: Secondary | ICD-10-CM | POA: Diagnosis present

## 2022-01-12 DIAGNOSIS — K219 Gastro-esophageal reflux disease without esophagitis: Secondary | ICD-10-CM | POA: Diagnosis present

## 2022-01-12 DIAGNOSIS — Z85828 Personal history of other malignant neoplasm of skin: Secondary | ICD-10-CM | POA: Diagnosis not present

## 2022-01-12 DIAGNOSIS — Z888 Allergy status to other drugs, medicaments and biological substances status: Secondary | ICD-10-CM | POA: Diagnosis not present

## 2022-01-12 DIAGNOSIS — F419 Anxiety disorder, unspecified: Secondary | ICD-10-CM | POA: Diagnosis present

## 2022-01-12 MED ORDER — ASPIRIN 81 MG PO CHEW: 81.0000 mg | CHEWABLE_TABLET | Freq: Two times a day (BID) | ORAL | 0 refills | Status: DC

## 2022-01-12 MED ORDER — HYDROCODONE-ACETAMINOPHEN 7.5-325 MG PO TABS: 1.0000 | ORAL_TABLET | ORAL | 0 refills | Status: DC | PRN

## 2022-01-12 NOTE — Clinical Social Work Note (Cosign Needed)
RE:  Linda Bradford   Date of Birth:   [id number]___________ Date: [id number]       To Whom It May Concern:  Please be advised that the above-named patient will require a short-term nursing home stay - anticipated 30 days or less for rehabilitation and strengthening.  The plan is for return home.   MD electronic signature noted below

## 2022-01-12 NOTE — Discharge Summary (Signed)
Physician Discharge Summary  Patient ID: Linda Bradford MRN: [id number] DOB/AGE: March 28, 1956 66 y.o.  Admit date: 01/10/2022 Discharge date: 01/12/2022  Admission Diagnoses:  M17.11 Unilateral primary osteoarthritis, right knee S/P TKR (total knee replacement) using cement, right  Discharge Diagnoses:  M17.11 Unilateral primary osteoarthritis, right knee Principal Problem:   S/P TKR (total knee replacement) using cement, right   Past Medical History:  Diagnosis Date   Anemia    Anxiety    Arthritis    Cancer ([place])    skin cancer, removed   COPD (chronic obstructive pulmonary disease) ([place])    Depression    Diabetes mellitus without complication ([facility])    Dyspnea    Dysrhythmia    occasional pvc's. not being treated.   Fatigue    GERD (gastroesophageal reflux disease)    Headache    Heart murmur    Hypertension    Hypothyroidism    Pneumonia    Thyroid disease    Tremors of nervous system     Surgeries: Procedure(s): TOTAL KNEE ARTHROPLASTY on 01/10/2022   Consultants (if any):   Discharged Condition: Improved  Hospital Course: Linda Bradford is an 66 y.o. female who was admitted 01/10/2022 with a diagnosis of  M17.11 Unilateral primary osteoarthritis, right knee S/P TKR (total knee replacement) using cement, right and went to the operating room on 01/10/2022 and underwent the above named procedures.    She was given perioperative antibiotics:  Anti-infectives (From admission, onward)    Start     Dose/Rate Route Frequency Ordered Stop   01/10/22 1400  ceFAZolin (ANCEF) IVPB 2g/100 mL premix        2 g 200 mL/hr over 30 Minutes Intravenous Every 6 hours 01/10/22 1244 01/11/22 0833   01/10/22 0637  ceFAZolin (ANCEF) 2-4 GM/100ML-% IVPB       Note to Pharmacy: Sylvester Harder P: cabinet override      01/10/22 0637 01/10/22 0758   01/10/22 0600  ceFAZolin (ANCEF) IVPB 2g/100 mL premix        2 g 200 mL/hr over 30 Minutes Intravenous On call to O.R. 01/10/22 0150 01/10/22 0803      .  She was given sequential compression devices, early ambulation, and aspirin 81 mg twice daily for 30 days for DVT prophylaxis.  She benefited maximally from the hospital stay and there were no complications.    Recent vital signs:  Vitals:   01/12/22 0312 01/12/22 0750  BP: (!) 162/60 (!) 171/69  Pulse: 79 80  Resp: 15 18  Temp: 98.2 F (36.8 C) 98.7 F (37.1 C)  SpO2: 93% 94%    Recent laboratory studies:  Lab Results  Component Value Date   HGB 13.8 12/24/2021   HGB 14.8 09/27/2021   HGB 12.1 05/11/2021   Lab Results  Component Value Date   WBC 8.0 12/24/2021   PLT 247 12/24/2021   Lab Results  Component Value Date   INR 0.9 04/29/2021   Lab Results  Component Value Date   NA 135 12/24/2021   K 4.8 12/24/2021   CL 98 12/24/2021   CO2 28 12/24/2021   BUN 14 12/24/2021   CREATININE 0.67 12/24/2021   GLUCOSE 134 (H) 12/24/2021    Discharge Medications:   Allergies as of 01/12/2022       Reactions   Dexilant [dexlansoprazole] Nausea Only, Other (See Comments)   Dizziness        Medication List     STOP taking these medications    HYDROcodone-acetaminophen  5-325 MG tablet Commonly known as: NORCO/VICODIN Replaced by: HYDROcodone-acetaminophen 7.5-325 MG tablet       TAKE these medications    acetaminophen 500 MG tablet Commonly known as: TYLENOL Take 1,000 mg by mouth every 6 (six) hours as needed for mild pain or moderate pain.   aerochamber plus with mask inhaler   albuterol 108 (90 Base) MCG/ACT inhaler Commonly known as: VENTOLIN HFA INHALE 2 PUFFS INTO THE LUNGS EVERY 4 HOURS AS NEEDED FOR WHEEZING OR SHORTNESS OF BREATH   aspirin 81 MG chewable tablet Chew 1 tablet (81 mg total) by mouth 2 (two) times daily. What changed: when to take this   benzonatate 200 MG capsule Commonly known as: TESSALON TAKE 1 CAPSULE BY MOUTH 3 TIMES DAILY ASNEEDED   buPROPion 150 MG 24 hr tablet Commonly known as: WELLBUTRIN XL TAKE 1  TABLET BY MOUTH ONCE DAILY. *MAKEAPPOINTMENT FOR FURTHER FILLS*   buPROPion 150 MG 24 hr tablet Commonly known as: Wellbutrin XL Take 1 tablet (150 mg total) by mouth daily.   docusate sodium 100 MG capsule Commonly known as: COLACE Take 1 capsule (100 mg total) by mouth 2 (two) times daily. What changed:  when to take this reasons to take this   DULoxetine 60 MG capsule Commonly known as: CYMBALTA TAKE 1 CAPSULE BY MOUTH TWICE DAILY   fluconazole 150 MG tablet Commonly known as: DIFLUCAN Take 1 tablet by mouth, if symptoms still remain after 3 days- take additional tablet by mouth. What changed:  when to take this reasons to take this   fluticasone 50 MCG/ACT nasal spray Commonly known as: FLONASE USE 2 SPRAYS IN EACH NOSTRIL ONCE DAILY   gabapentin 300 MG capsule Commonly known as: NEURONTIN TAKE 3 CAPSULES BY MOUTH ONCE EVERY MORNING AND 5 CAPSULES AT BEDTIME What changed: See the new instructions.   glucose blood test strip Commonly known as: Contour Next Test To check blood sugar once daily   HYDROcodone-acetaminophen 7.5-325 MG tablet Commonly known as: NORCO Take 1-2 tablets by mouth every 4 (four) hours as needed for moderate pain (pain score 7-10). Replaces: HYDROcodone-acetaminophen 5-325 MG tablet   hydrOXYzine 10 MG tablet Commonly known as: ATARAX Take 1 tablet (10 mg total) by mouth 3 (three) times daily as needed.   ipratropium-albuterol 0.5-2.5 (3) MG/3ML Soln Commonly known as: DUONEB Take 3 mLs by nebulization every 6 (six) hours as needed.   levothyroxine 75 MCG tablet Commonly known as: SYNTHROID Take 1 tablet (75 mcg total) by mouth daily before breakfast.   loperamide 2 MG tablet Commonly known as: IMODIUM A-D Take 2-4 mg by mouth 4 (four) times daily as needed for diarrhea or loose stools.   meloxicam 15 MG tablet Commonly known as: MOBIC Take 1 tablet (15 mg total) by mouth daily.   metaxalone 800 MG tablet Commonly known as:  SKELAXIN Take 1 tablet (800 mg total) by mouth 3 (three) times daily.   metFORMIN 500 MG 24 hr tablet Commonly known as: GLUCOPHAGE-XR TAKE 2 TABLETS BY MOUTH ONCE A DAY WITH SUPPER   metoprolol succinate 50 MG 24 hr tablet Commonly known as: TOPROL-XL TAKE 1 TABLET BY MOUTH ONCE DAILY. TAKE WITH OR IMMEDIATELY FOLLOWING A MEAL   nystatin 100000 UNIT/ML suspension Commonly known as: MYCOSTATIN Take 5 mLs (500,000 Units total) by mouth 4 (four) times daily.   nystatin powder Commonly known as: MYCOSTATIN/NYSTOP Apply 1 application topically 3 (three) times daily. What changed:  when to take this reasons to take this  oxybutynin 5 MG 24 hr tablet Commonly known as: DITROPAN-XL Take 1 tablet (5 mg total) by mouth at bedtime.   primidone 50 MG tablet Commonly known as: MYSOLINE Take 50 mg by mouth 2 (two) times daily.   rosuvastatin 10 MG tablet Commonly known as: Crestor Take 1 tablet (10 mg total) by mouth daily.   theophylline 300 MG 24 hr capsule Commonly known as: Theo-24 Take 1 capsule (300 mg total) by mouth daily.   traZODone 100 MG tablet Commonly known as: DESYREL Take 2 tablets (200 mg total) by mouth at bedtime as needed for sleep.   Trelegy Ellipta 200-62.5-25 MCG/ACT Aepb Generic drug: Fluticasone-Umeclidin-Vilant Inhale 1 puff into the lungs 2 (two) times daily.   triamcinolone ointment 0.5 % Commonly known as: KENALOG Apply 1 application. topically 2 (two) times daily.   vitamin B-12 1000 MCG tablet Commonly known as: CYANOCOBALAMIN Take 1,000 mcg by mouth daily.   Vitamin D 50 MCG (2000 UT) tablet Take 2,000 Units by mouth daily.               Durable Medical Equipment  (From admission, onward)           Start     Ordered   01/12/22 1004  For home use only DME 3 n 1  Once        01/12/22 1003   01/12/22 1004  For home use only DME Walker rolling  Once       Question Answer Comment  Walker: With 5 Inch Wheels   Patient needs a  walker to treat with the following condition Osteoarthritis of right knee      01/12/22 1003            Diagnostic Studies: DG Knee Right Port  Result Date: 01/10/2022 CLINICAL DATA:  Status post total right knee arthroplasty. EXAM: PORTABLE RIGHT KNEE - 1-2 VIEW COMPARISON:  None Available. FINDINGS: Status post total knee arthroplasty. No perihardware lucency is seen to indicate hardware failure or loosening. Expected postoperative changes including moderate joint effusion, intra-articular and subcutaneous air and soft tissue swelling. Anterior surgical skin staples. No acute fracture or dislocation. IMPRESSION: Status post total right knee arthroplasty without evidence of hardware failure. Electronically Signed   By: Yvonne Kendall M.D.   On: 01/10/2022 11:07   [place] OR NERVE BLOCK-IMAGE ONLY [facility])  Result Date: 01/10/2022 There is no interpretation for this exam.  This order is for images obtained during a surgical procedure.  Please See "Surgeries" Tab for more information regarding the procedure.   CT CHEST LCS NODULE F/U LOW DOSE WO CONTRAST  Result Date: 12/22/2021 CLINICAL DATA:  66 year old female with 50 pack-year history of smoking. Lung cancer screening. EXAM: CT CHEST WITHOUT CONTRAST FOR LUNG CANCER SCREENING NODULE FOLLOW-UP TECHNIQUE: Multidetector CT imaging of the chest was performed following the standard protocol without IV contrast. RADIATION DOSE REDUCTION: This exam was performed according to the departmental dose-optimization program which includes automated exposure control, adjustment of the mA and/or kV according to patient size and/or use of iterative reconstruction technique. COMPARISON:  09/16/2021 FINDINGS: Cardiovascular: The heart size is normal. No substantial pericardial effusion. Mild atherosclerotic calcification is noted in the wall of the thoracic aorta. Mediastinum/Nodes: No mediastinal lymphadenopathy. No evidence for gross hilar lymphadenopathy although  assessment is limited by the lack of intravenous contrast on the current study. The esophagus has normal imaging features. There is no axillary lymphadenopathy. Lungs/Pleura: Centrilobular and paraseptal emphysema evident. Scattered tiny bilateral pulmonary nodules identified  previously are stable in the interval. Nodules identified previously is new in the medial left lower lobe have resolved in the interval consistent with infectious/inflammatory etiology. No new suspicious pulmonary nodule or mass. No focal airspace consolidation. No pleural effusion. Upper Abdomen: Unremarkable. Musculoskeletal: No worrisome lytic or sclerotic osseous abnormality. IMPRESSION: 1. Lung-RADS 2, benign appearance or behavior. Continue annual screening with low-dose chest CT without contrast in 12 months. 2. Aortic Atherosclerosis (ICD10-I70.0) and Emphysema (ICD10-J43.9). Electronically Signed   By: Misty Stanley M.D.   On: 12/22/2021 10:13    Disposition: Discharge disposition: 01-Home or Self Care            Signed: Carlynn Spry ,PA-C 01/12/2022, 10:04 AM

## 2022-01-12 NOTE — Discharge Instructions (Signed)
Continue weight bear as tolerated on the right lower extremity.    Elevate the right lower extremity whenever possible and continue the polar care while elevating the extremity. Patient may shower. No bath or submerging the wound.    Take aspirin 81 mg twice daily for 30 days as directed for blood clot prevention.  Continue to work on knee range of motion exercises at home as instructed by physical therapy. Continue to use a walker for assistance with ambulation until cleared by physical therapy.  Call [phone] with any questions, such as fever > 101.5 degrees, drainage from the wound or shortness of breath. 

## 2022-01-12 NOTE — Progress Notes (Signed)
Physical Therapy Treatment Patient Details Name: Linda Bradford MRN: [id number] DOB: Jan 27, 1956 Today's Date: 01/12/2022   History of Present Illness 66 y/o female s/p R TKA on 01/10/22. PMH includes COPD, HTN, dysrhythmias, HA's, anxiety, DM, fibromyalgia, thyroid disease, and tremors of nervous system; L TKA in 05/2022.    PT Comments    Pt was sitting in recliner upon arriving. Per RN staff, she needed alot of help to get to chair. She struggles throughout session to stay awake and presents with severe cognition concerns. Has not had any narcotics since ~ 5 am (currently ~9 am). Pt unaware she has DC orders to go home. Has one episode where she was talking to TV commercial. Chief Strategy Officer does best to orient pt to current situation however pt is unable. She was only able to stand and take a few steps prior to stating," I can do this right now and tries to sit without chair behind her. Pt has poor insight of deficits and current situation. Per chart review, her cognition is much more altered today. Called pt's spouse to discuss assistance available at DC." I cant help her. I'm disabled and have my own caregivers here 8 hrs a day." Pt will need 24/7 assistance at DC especially if cognition remains in current state. DC recs changed to follow Mds recs. Do not feel she can safely manage  at home in current state.     Recommendations for follow up therapy are one component of a multi-disciplinary discharge planning process, led by the attending physician.  Recommendations may be updated based on patient status, additional functional criteria and insurance authorization.  Follow Up Recommendations  Follow physician's recommendations for discharge plan and follow up therapies     Assistance Recommended at Discharge Frequent or constant Supervision/Assistance  Patient can return home with the following A little help with walking and/or transfers;A little help with bathing/dressing/bathroom;Assistance with  cooking/housework;Assistance with feeding;Direct supervision/assist for medications management;Direct supervision/assist for financial management;Assist for transportation;Help with stairs or ramp for entrance   Equipment Recommendations  None recommended by PT       Precautions / Restrictions Precautions Precautions: Fall Restrictions Weight Bearing Restrictions: Yes RLE Weight Bearing: Weight bearing as tolerated     Mobility  Bed Mobility  General bed mobility comments: in recliner however per RN staff" it took both of [place] to get her to recliner."    Transfers Overall transfer level: Needs assistance Equipment used: Rolling walker (2 wheels) Transfers: Sit to/from Stand Sit to Stand: Min assist    General transfer comment: min assist to be able to stand from lower recliner height surface.    Ambulation/Gait Ambulation/Gait assistance: Min guard, Min assist Gait Distance (Feet): 8 Feet Assistive device: Rolling walker (2 wheels) Gait Pattern/deviations: Step-to pattern, Antalgic Gait velocity: decreased     General Gait Details: pt only ambulated 8 ft prior to stopping and basically falling asleep. Needs constant vcs to stay focused on desired task. " I need to sit. I cant do it right now."   Stairs  General stair comments: unsafe to trial      Balance Overall balance assessment: Needs assistance Sitting-balance support: Feet supported Sitting balance-Leahy Scale: Good     Standing balance support: Bilateral upper extremity supported, During functional activity Standing balance-Leahy Scale: Fair       Cognition Arousal/Alertness: Lethargic Behavior During Therapy: WFL for tasks assessed/performed Overall Cognitive Status: Impaired/Different from baseline        General Comments: Pt's cognition is much more poor  today. she was talking to TV and states several statements that are off topic. Author questions is due to medications however no narcotics have  been given for over 4 hours prior to author arriving           General Comments General comments (skin integrity, edema, etc.): Pryor Curia will return later this date in hopes pt will perform better. MD and RN staff made aware. Discussed with spouse pt being DC home," I cant help her. I'm disabled. we have a aide here 8 hours a day." pt unable to participate in any ROM/ ther ex due to lethargy      Pertinent Vitals/Pain Pain Assessment Pain Assessment: 0-10 Pain Score: 4  Pain Descriptors / Indicators: Aching, Grimacing, Guarding, Sore Pain Intervention(s): Limited activity within patient's tolerance, Premedicated before session, Monitored during session, Repositioned, Ice applied     PT Goals (current goals can now be found in the care plan section) Acute Rehab PT Goals Patient Stated Goal: to go home Progress towards PT goals: Progressing toward goals    Frequency    BID      PT Plan Discharge plan needs to be updated       AM-PAC PT "6 Clicks" Mobility   Outcome Measure  Help needed turning from your back to your side while in a flat bed without using bedrails?: None Help needed moving from lying on your back to sitting on the side of a flat bed without using bedrails?: A Little Help needed moving to and from a bed to a chair (including a wheelchair)?: A Little Help needed standing up from a chair using your arms (e.g., wheelchair or bedside chair)?: A Little Help needed to walk in hospital room?: A Little Help needed climbing 3-5 steps with a railing? : A Little 6 Click Score: 19    End of Session   Activity Tolerance: Patient limited by lethargy;Other (comment) (limited by pt's cognition and overall poor safety) Patient left: in chair;with call bell/phone within reach;with chair alarm set Nurse Communication: Mobility status PT Visit Diagnosis: Other abnormalities of gait and mobility (R26.89);Muscle weakness (generalized) (M62.81);Difficulty in walking, not  elsewhere classified (R26.2);Pain Pain - Right/Left: Right Pain - part of body: Knee     Time: 8250-5397 PT Time Calculation (min) (ACUTE ONLY): 40 min  Charges:  $Gait Training: 8-22 mins $Therapeutic Activity: 23-37 mins                     Julaine Fusi PTA 01/12/22, 12:07 PM

## 2022-01-12 NOTE — Progress Notes (Signed)
Physical Therapy Treatment Patient Details Name: Linda Bradford MRN: [id number] DOB: 1956-06-25 Today's Date: 01/12/2022   History of Present Illness 66 y/o female s/p R TKA on 01/10/22. PMH includes COPD, HTN, dysrhythmias, HA's, anxiety, DM, fibromyalgia, thyroid disease, and tremors of nervous system; L TKA in 05/2022.    PT Comments    Pt was asleep in recliner upon arriving. She easily awakes and agrees to session with encouragement. Continues to present with altered mental status at times but much improved from earlier session. Pt did not remember working with Pryor Curia this morning. " I didn't sleep good last night." She is extremely slow moving and still requiring assistance with all transfers, mobility, and gait. Unable to trial stairs due to poor/unsafe gait short distances. Pt endorses nausea and requested to get into bed. Once pt lays down, quickly drifts off to sleep. PT recommends SNF due to cognition and limited assistance available at home.    Recommendations for follow up therapy are one component of a multi-disciplinary discharge planning process, led by the attending physician.  Recommendations may be updated based on patient status, additional functional criteria and insurance authorization.  Follow Up Recommendations  Skilled nursing-short term rehab (<3 hours/day)     Assistance Recommended at Discharge Frequent or constant Supervision/Assistance  Patient can return home with the following A little help with walking and/or transfers;A little help with bathing/dressing/bathroom;Assistance with cooking/housework;Assistance with feeding;Direct supervision/assist for medications management;Direct supervision/assist for financial management;Assist for transportation;Help with stairs or ramp for entrance   Equipment Recommendations  None recommended by PT       Precautions / Restrictions Precautions Precautions: Fall Restrictions Weight Bearing Restrictions: Yes RLE Weight  Bearing: Weight bearing as tolerated     Mobility  Bed Mobility Overal bed mobility: Needs Assistance Bed Mobility: Sit to Supine       Sit to supine: Min assist   General bed mobility comments: Min assist to progress RLE into bed. pt quickly falls asleep    Transfers Overall transfer level: Needs assistance Equipment used: Rolling walker (2 wheels) Transfers: Sit to/from Stand Sit to Stand: Min guard           General transfer comment: CGA for safety    Ambulation/Gait Ambulation/Gait assistance: Min guard Gait Distance (Feet): 15 Feet Assistive device: Rolling walker (2 wheels) Gait Pattern/deviations: Step-to pattern, Antalgic Gait velocity: decreased     General Gait Details: Extremely slow antalgic step to pattern. Continues to need constant vcs for staying awake, focused on task and alert. Cognition still continues to be altered from previous date." I did not sleep last night.'   Stairs  General stair comments: unable   Balance Overall balance assessment: Needs assistance Sitting-balance support: Feet supported Sitting balance-Leahy Scale: Good     Standing balance support: Bilateral upper extremity supported, During functional activity Standing balance-Leahy Scale: Fair      Cognition Arousal/Alertness: Lethargic Behavior During Therapy: Flat affect Overall Cognitive Status: Impaired/Different from baseline    General Comments: Pt is somewhat lethargic throughout. per staff, pt has been sleeping alot. Continues to present with some confusion at times however overall much improved from earlier session. She follows commands but does not feel she can return home today. discussed rehab but pt wants to still DC home once cleared           General Comments General comments (skin integrity, edema, etc.): Author will return later this date in hopes pt will perform better. MD and RN staff made aware. Discussed with  spouse pt being DC home," I cant help her.  I'm disabled. we have a aide here 8 hours a day." pt unable to participate in any ROM/ ther ex due to lethargy      Pertinent Vitals/Pain Pain Assessment Pain Assessment: 0-10 Pain Score: 7  Pain Descriptors / Indicators: Aching, Grimacing, Guarding, Sore Pain Intervention(s): Limited activity within patient's tolerance, Monitored during session, Premedicated before session, Repositioned, Ice applied     PT Goals (current goals can now be found in the care plan section) Acute Rehab PT Goals Patient Stated Goal: feel better Progress towards PT goals: Not progressing toward goals - comment (cognition and alertness limiting session progression)    Frequency    BID      PT Plan Discharge plan needs to be updated       AM-PAC PT "6 Clicks" Mobility   Outcome Measure  Help needed turning from your back to your side while in a flat bed without using bedrails?: None Help needed moving from lying on your back to sitting on the side of a flat bed without using bedrails?: A Little Help needed moving to and from a bed to a chair (including a wheelchair)?: A Little Help needed standing up from a chair using your arms (e.g., wheelchair or bedside chair)?: A Little Help needed to walk in hospital room?: A Little Help needed climbing 3-5 steps with a railing? : A Little 6 Click Score: 19    End of Session   Activity Tolerance: Patient tolerated treatment well Patient left: with call bell/phone within reach;in bed;with bed alarm set Nurse Communication: Mobility status PT Visit Diagnosis: Other abnormalities of gait and mobility (R26.89);Muscle weakness (generalized) (M62.81);Difficulty in walking, not elsewhere classified (R26.2);Pain Pain - Right/Left: Right Pain - part of body: Knee     Time: 1440-1455 PT Time Calculation (min) (ACUTE ONLY): 15 min  Charges:  $Gait Training: 8-22 mins $Therapeutic Activity: 23-37 mins                    Julaine Fusi PTA 01/12/22, 3:12 PM

## 2022-01-12 NOTE — Plan of Care (Signed)
  Problem: Pain Management: Goal: Pain level will decrease with appropriate interventions Outcome: Progressing   

## 2022-01-12 NOTE — NC FL2 (Signed)
[place] LEVEL OF CARE SCREENING TOOL     IDENTIFICATION  Patient Name: Linda Bradford Birthdate: 1956/05/01 Sex: female Admission Date (Current Location): 01/10/2022  [place] and [place] Number:  Engineering geologist and Address:  [facility], [address], [place], [place] [address]      Provider Number: [id number]  Attending Physician Name and Address:  Lovell Sheehan, MD  Relative Name and Phone Number:       Current Level of Care: Hospital Recommended Level of Care: [place] Prior Approval Number:    Date Approved/Denied:   PASRR Number:    Discharge Plan: SNF    Current Diagnoses: Patient Active Problem List   Diagnosis Date Noted   S/P TKR (total knee replacement) using cement, right 01/10/2022   Vulvar rash 12/07/2021   LVH (left ventricular hypertrophy) 10/24/2021   Aortic regurgitation 76/19/5093   Systolic murmur 26/71/2458   Thrush of mouth and esophagus ([place]) 10/01/2021   Skin yeast infection 10/01/2021   Vaginal yeast infection 10/01/2021   Current chronic use of inhaled steroid 10/01/2021   Chronic pain syndrome 10/01/2021   Mixed hyperlipidemia 10/01/2021   Nodule of left lung 09/20/2021   Hyperlipidemia associated with type 2 diabetes mellitus ([place]) 09/02/2021   Type 2 diabetes mellitus with hyperglycemia, without long-term current use of insulin ([place]) 09/02/2021   Hypertension associated with type 2 diabetes mellitus ([place]) 09/02/2021   Vaginal cyst 09/02/2021   COPD with exacerbation ([place]) 09/02/2021   S/P total knee replacement, left 05/10/2021   Chronic obstructive pulmonary disease with acute exacerbation (HCC) 12/31/2019   Cough 12/31/2019   Biliary dyskinesia    Diverticulitis of large intestine with abscess 05/27/2018   Diabetes mellitus without complication ([place]) 09/98/3382   Lateral epicondylitis 07/07/2017   Personal history of tobacco use, presenting hazards to health 05/03/2016    Bronchitis, chronic ([place]) 03/03/2015   Anxiety, generalized 11/05/2014   Alcohol abuse, in remission 11/05/2014   Nicotine addiction 11/05/2014   H/O: osteoarthritis 11/05/2014   Fibromyalgia 11/05/2014   Restless leg syndrome 11/05/2014   Arthralgia of hip 01/13/2014   Adiposity 01/13/2014   Disordered sleep 01/13/2014   Airway hyperreactivity 12/17/2013   Adult hypothyroidism 12/17/2013   Current tobacco use 12/17/2013   Basal cell carcinoma 11/04/2013   Arthropathia 02/08/2012   Arthritis of knee, left 02/08/2012    Orientation RESPIRATION BLADDER Height & Weight     Self, Time, Situation, Place  Normal Incontinent Weight: 225 lb (102.1 kg) Height:  '5\' 8"'$  (172.7 cm)  BEHAVIORAL SYMPTOMS/MOOD NEUROLOGICAL BOWEL NUTRITION STATUS      Incontinent Diet (regular diet, thin liquids)  AMBULATORY STATUS COMMUNICATION OF NEEDS Skin   Limited Assist Verbally Surgical wounds (closed incision right knee, ace wrap)                       Personal Care Assistance Level of Assistance  Dressing, Feeding, Bathing Bathing Assistance: Limited assistance Feeding assistance: Independent Dressing Assistance: Limited assistance     Functional Limitations Info  Sight, Hearing, Speech Sight Info: Adequate Hearing Info: Adequate Speech Info: Adequate    SPECIAL CARE FACTORS FREQUENCY  PT (By licensed PT), OT (By licensed OT)     PT Frequency: 5x OT Frequency: 5x            Contractures Contractures Info: Not present    Additional Factors Info  Code Status, Allergies Code Status Info: full code Allergies Info: Dexilant ([place])  Current Medications (01/12/2022):  This is the current hospital active medication list Current Facility-Administered Medications  Medication Dose Route Frequency Provider Last Rate Last Admin   acetaminophen (TYLENOL) tablet 325-650 mg  325-650 mg Oral Q6H PRN Lovell Sheehan, MD       albuterol (PROVENTIL) (2.5 MG/3ML)  0.083% nebulizer solution 3 mL  3 mL Inhalation Q4H PRN Lovell Sheehan, MD       alum & mag hydroxide-simeth (MAALOX/MYLANTA) 200-200-20 MG/5ML suspension 30 mL  30 mL Oral Q4H PRN Lovell Sheehan, MD   30 mL at 01/11/22 1105   aspirin chewable tablet 81 mg  81 mg Oral BID Lovell Sheehan, MD   81 mg at 01/12/22 0932   benzonatate (TESSALON) capsule 200 mg  200 mg Oral TID PRN Lovell Sheehan, MD       bisacodyl (DULCOLAX) suppository 10 mg  10 mg Rectal Daily PRN Lovell Sheehan, MD   10 mg at 01/11/22 0644   buPROPion (WELLBUTRIN XL) 24 hr tablet 150 mg  150 mg Oral Daily Lovell Sheehan, MD   150 mg at 01/12/22 0932   Chlorhexidine Gluconate Cloth 2 % PADS 6 each  6 each Topical Daily Lovell Sheehan, MD   6 each at 01/12/22 0937   diphenhydrAMINE (BENADRYL) 12.5 MG/5ML elixir 12.5-25 mg  12.5-25 mg Oral Q4H PRN Lovell Sheehan, MD       docusate sodium (COLACE) capsule 100 mg  100 mg Oral BID Lovell Sheehan, MD   100 mg at 01/12/22 0934   DULoxetine (CYMBALTA) DR capsule 60 mg  60 mg Oral BID Lovell Sheehan, MD   60 mg at 01/12/22 0935   fluticasone (FLONASE) 50 MCG/ACT nasal spray 2 spray  2 spray Each Nare Daily Lovell Sheehan, MD   2 spray at 01/12/22 0936   fluticasone furoate-vilanterol (BREO ELLIPTA) 200-25 MCG/ACT 1 puff  1 puff Inhalation Daily Lovell Sheehan, MD   1 puff at 01/12/22 0936   And   umeclidinium bromide (INCRUSE ELLIPTA) 62.5 MCG/ACT 1 puff  1 puff Inhalation Daily Lovell Sheehan, MD   1 puff at 01/12/22 0936   gabapentin (NEURONTIN) capsule 300 mg  300 mg Oral QHS Lovell Sheehan, MD   300 mg at 01/11/22 2056   HYDROcodone-acetaminophen (NORCO) 7.5-325 MG per tablet 1-2 tablet  1-2 tablet Oral Q4H PRN Lovell Sheehan, MD   2 tablet at 01/12/22 0510   HYDROcodone-acetaminophen (NORCO/VICODIN) 5-325 MG per tablet 1-2 tablet  1-2 tablet Oral Q4H PRN Lovell Sheehan, MD   2 tablet at 01/11/22 1729   hydrOXYzine (ATARAX) tablet 10 mg  10 mg Oral TID PRN Lovell Sheehan,  MD       ipratropium-albuterol (DUONEB) 0.5-2.5 (3) MG/3ML nebulizer solution 3 mL  3 mL Nebulization Q6H PRN Lovell Sheehan, MD       lactated ringers infusion   Intravenous Continuous Lovell Sheehan, MD   Stopped at 01/10/22 1315   levothyroxine (SYNTHROID) tablet 75 mcg  75 mcg Oral QAC breakfast Lovell Sheehan, MD   75 mcg at 01/12/22 0510   loperamide (IMODIUM) capsule 2-4 mg  2-4 mg Oral QID PRN Lovell Sheehan, MD       menthol-cetylpyridinium (CEPACOL) lozenge 3 mg  1 lozenge Oral PRN Lovell Sheehan, MD       Or   phenol (CHLORASEPTIC) mouth spray 1 spray  1 spray Mouth/Throat PRN Kurtis Bushman  R, MD       metaxalone (SKELAXIN) tablet 800 mg  800 mg Oral TID Lovell Sheehan, MD   800 mg at 01/12/22 0934   metFORMIN (GLUCOPHAGE-XR) 24 hr tablet 1,000 mg  1,000 mg Oral Q supper Lovell Sheehan, MD   1,000 mg at 01/11/22 1723   metoCLOPramide (REGLAN) tablet 5-10 mg  5-10 mg Oral Q8H PRN Lovell Sheehan, MD       Or   metoCLOPramide (REGLAN) injection 5-10 mg  5-10 mg Intravenous Q8H PRN Lovell Sheehan, MD       metoprolol succinate (TOPROL-XL) 24 hr tablet 50 mg  50 mg Oral Daily Lovell Sheehan, MD   50 mg at 01/12/22 0933   morphine (PF) 2 MG/ML injection 0.5-1 mg  0.5-1 mg Intravenous Q2H PRN Lovell Sheehan, MD   1 mg at 01/10/22 2116   ondansetron (ZOFRAN) tablet 4 mg  4 mg Oral Q6H PRN Lovell Sheehan, MD       Or   ondansetron [facility]) injection 4 mg  4 mg Intravenous Q6H PRN Lovell Sheehan, MD   4 mg at 01/10/22 1733   oxybutynin (DITROPAN-XL) 24 hr tablet 5 mg  5 mg Oral QHS Lovell Sheehan, MD   5 mg at 01/11/22 2056   primidone (MYSOLINE) tablet 50 mg  50 mg Oral BID Lovell Sheehan, MD   50 mg at 01/12/22 0935   rosuvastatin (CRESTOR) tablet 10 mg  10 mg Oral Daily Lovell Sheehan, MD   10 mg at 01/12/22 0934   theophylline (THEO-24) 24 hr capsule 300 mg  300 mg Oral Daily Renda Rolls, RPH   300 mg at 01/12/22 0935   traZODone (DESYREL) tablet 200 mg  200 mg Oral QHS  PRN Lovell Sheehan, MD   200 mg at 01/11/22 2100   vitamin B-12 (CYANOCOBALAMIN) tablet 1,000 mcg  1,000 mcg Oral Daily Lovell Sheehan, MD   1,000 mcg at 01/12/22 9983     Discharge Medications: Please see discharge summary for a list of discharge medications.  Relevant Imaging Results:  Relevant Lab Results:   Additional Information ssn: [id number]  Eileen Stanford, LCSW

## 2022-01-12 NOTE — Progress Notes (Signed)
  Subjective:  Patient reports pain as moderate.    Objective:   VITALS:   Vitals:   01/11/22 1506 01/11/22 2000 01/12/22 0312 01/12/22 0750  BP: (!) 107/52 (!) 146/56 (!) 162/60 (!) 171/69  Pulse: 72 73 79 80  Resp:  _0 Temp: 99 F (37.2 C) 98.1 F (36.7 C) 98.2 F (36.8 C) 98.7 F (37.1 C)  TempSrc: Oral     SpO2: 95% 95% 93% 94%  Weight:      Height:        PHYSICAL EXAM:  Neurologically intact ABD soft Neurovascular intact Sensation intact distally Intact pulses distally Dorsiflexion/Plantar flexion intact Incision: dressing C/D/I No cellulitis present Compartment soft  LABS  No results found for this or any previous visit (from the past 24 hour(s)).  No results found.  Assessment/Plan: 2 Days Post-Op   Principal Problem:   S/P TKR (total knee replacement) using cement, right   Advance diet Up with therapy D/C home when PT goals met  Carlynn Spry , PA-C 01/12/2022, 9:59 AM

## 2022-01-13 MED ORDER — HYDROCODONE-ACETAMINOPHEN 7.5-325 MG PO TABS: 1.0000 | ORAL_TABLET | ORAL | 0 refills | Status: DC | PRN

## 2022-01-13 MED ORDER — ASPIRIN 81 MG PO CHEW: 81.0000 mg | CHEWABLE_TABLET | Freq: Two times a day (BID) | ORAL | 0 refills | Status: DC

## 2022-01-13 NOTE — Plan of Care (Signed)
  Problem: Nutrition: Goal: Adequate nutrition will be maintained Outcome: Progressing   Problem: Activity: Goal: Risk for activity intolerance will decrease Outcome: Progressing

## 2022-01-13 NOTE — Plan of Care (Signed)
  Problem: Education: Goal: Knowledge of General Education information will improve Description: Including pain rating scale, medication(s)/side effects and non-pharmacologic comfort measures Outcome: Progressing   Problem: Health Behavior/Discharge Planning: Goal: Ability to manage health-related needs will improve Outcome: Progressing   Problem: Clinical Measurements: Goal: Ability to maintain clinical measurements within normal limits will improve Outcome: Progressing Goal: Will remain free from infection Outcome: Progressing Goal: Diagnostic test results will improve Outcome: Progressing Goal: Respiratory complications will improve Outcome: Progressing Goal: Cardiovascular complication will be avoided Outcome: Progressing   Problem: Activity: Goal: Risk for activity intolerance will decrease Outcome: Progressing   Problem: Nutrition: Goal: Adequate nutrition will be maintained Outcome: Progressing   Problem: Coping: Goal: Level of anxiety will decrease Outcome: Progressing   Problem: Elimination: Goal: Will not experience complications related to bowel motility Outcome: Progressing Goal: Will not experience complications related to urinary retention Outcome: Progressing   Problem: Pain Managment: Goal: General experience of comfort will improve Outcome: Progressing   Problem: Safety: Goal: Ability to remain free from injury will improve Outcome: Progressing   Problem: Skin Integrity: Goal: Risk for impaired skin integrity will decrease Outcome: Progressing   Problem: Education: Goal: Knowledge of the prescribed therapeutic regimen will improve Outcome: Progressing Goal: Individualized Educational Video(s) Outcome: Progressing   Problem: Activity: Goal: Ability to avoid complications of mobility impairment will improve Outcome: Progressing Goal: Range of joint motion will improve Outcome: Progressing   Problem: Clinical Measurements: Goal:  Postoperative complications will be avoided or minimized Outcome: Progressing   Problem: Pain Management: Goal: Pain level will decrease with appropriate interventions Outcome: Progressing   Problem: Skin Integrity: Goal: Will show signs of wound healing Outcome: Progressing   Problem: Education: Goal: Knowledge of General Education information will improve Description: Including pain rating scale, medication(s)/side effects and non-pharmacologic comfort measures Outcome: Progressing   Problem: Health Behavior/Discharge Planning: Goal: Ability to manage health-related needs will improve Outcome: Progressing   Problem: Clinical Measurements: Goal: Ability to maintain clinical measurements within normal limits will improve Outcome: Progressing Goal: Will remain free from infection Outcome: Progressing Goal: Diagnostic test results will improve Outcome: Progressing Goal: Respiratory complications will improve Outcome: Progressing Goal: Cardiovascular complication will be avoided Outcome: Progressing   Problem: Activity: Goal: Risk for activity intolerance will decrease Outcome: Progressing   Problem: Nutrition: Goal: Adequate nutrition will be maintained Outcome: Progressing   Problem: Coping: Goal: Level of anxiety will decrease Outcome: Progressing   Problem: Elimination: Goal: Will not experience complications related to bowel motility Outcome: Progressing Goal: Will not experience complications related to urinary retention Outcome: Progressing   Problem: Pain Managment: Goal: General experience of comfort will improve Outcome: Progressing   Problem: Safety: Goal: Ability to remain free from injury will improve Outcome: Progressing   Problem: Skin Integrity: Goal: Risk for impaired skin integrity will decrease Outcome: Progressing   

## 2022-01-13 NOTE — Progress Notes (Signed)
Discharge paperwork given and explain to patient, states having no questions at this time.  IV removed/skin intact.  Belongings collected and given to patient.  Patient currently waiting on ride.

## 2022-01-13 NOTE — Discharge Summary (Signed)
Physician Discharge Summary  Patient ID: Linda Bradford MRN: [id number] DOB/AGE: 09-25-1955 66 y.o.  Admit date: 01/10/2022 Discharge date: 01/13/2022  Admission Diagnoses:  M17.11 Unilateral primary osteoarthritis, right knee S/P TKR (total knee replacement) using cement, right  Discharge Diagnoses:  M17.11 Unilateral primary osteoarthritis, right knee Principal Problem:   S/P TKR (total knee replacement) using cement, right   Past Medical History:  Diagnosis Date   Anemia    Anxiety    Arthritis    Cancer ([place])    skin cancer, removed   COPD (chronic obstructive pulmonary disease) ([place])    Depression    Diabetes mellitus without complication (HCC)    Dyspnea    Dysrhythmia    occasional pvc's. not being treated.   Fatigue    GERD (gastroesophageal reflux disease)    Headache    Heart murmur    Hypertension    Hypothyroidism    Pneumonia    Thyroid disease    Tremors of nervous system     Surgeries: Procedure(s): TOTAL KNEE ARTHROPLASTY on 01/10/2022   Consultants (if any):   Discharged Condition: Improved  Hospital Course: Linda Bradford is an 66 y.o. female who was admitted 01/10/2022 with a diagnosis of  M17.11 Unilateral primary osteoarthritis, right knee S/P TKR (total knee replacement) using cement, right and went to the operating room on 01/10/2022 and underwent the above named procedures.    She was given perioperative antibiotics:  Anti-infectives (From admission, onward)    Start     Dose/Rate Route Frequency Ordered Stop   01/10/22 1400  ceFAZolin (ANCEF) IVPB 2g/100 mL premix        2 g 200 mL/hr over 30 Minutes Intravenous Every 6 hours 01/10/22 1244 01/11/22 0833   01/10/22 0637  ceFAZolin (ANCEF) 2-4 GM/100ML-% IVPB       Note to Pharmacy: Sylvester Harder P: cabinet override      01/10/22 0637 01/10/22 0758   01/10/22 0600  ceFAZolin (ANCEF) IVPB 2g/100 mL premix        2 g 200 mL/hr over 30 Minutes Intravenous On call to O.R. 01/10/22 0150 01/10/22 0803      .  She was given sequential compression devices, early ambulation, and aspirin 81 mg twice daily for 30 days for DVT prophylaxis.  She benefited maximally from the hospital stay and there were no complications.    Recent vital signs:  Vitals:   01/12/22 2344 01/13/22 0539  BP: (!) 139/52 106/72  Pulse: 81 79  Resp: 17 17  Temp: 98.8 F (37.1 C) 98.9 F (37.2 C)  SpO2: 100% 95%    Recent laboratory studies:  Lab Results  Component Value Date   HGB 13.8 12/24/2021   HGB 14.8 09/27/2021   HGB 12.1 05/11/2021   Lab Results  Component Value Date   WBC 8.0 12/24/2021   PLT 247 12/24/2021   Lab Results  Component Value Date   INR 0.9 04/29/2021   Lab Results  Component Value Date   NA 135 12/24/2021   K 4.8 12/24/2021   CL 98 12/24/2021   CO2 28 12/24/2021   BUN 14 12/24/2021   CREATININE 0.67 12/24/2021   GLUCOSE 134 (H) 12/24/2021    Discharge Medications:   Allergies as of 01/13/2022       Reactions   Dexilant [dexlansoprazole] Nausea Only, Other (See Comments)   Dizziness        Medication List     STOP taking these medications    HYDROcodone-acetaminophen 5-325  MG tablet Commonly known as: NORCO/VICODIN Replaced by: HYDROcodone-acetaminophen 7.5-325 MG tablet       TAKE these medications    acetaminophen 500 MG tablet Commonly known as: TYLENOL Take 1,000 mg by mouth every 6 (six) hours as needed for mild pain or moderate pain.   aerochamber plus with mask inhaler   albuterol 108 (90 Base) MCG/ACT inhaler Commonly known as: VENTOLIN HFA INHALE 2 PUFFS INTO THE LUNGS EVERY 4 HOURS AS NEEDED FOR WHEEZING OR SHORTNESS OF BREATH   aspirin 81 MG chewable tablet Chew 1 tablet (81 mg total) by mouth 2 (two) times daily. What changed: when to take this   benzonatate 200 MG capsule Commonly known as: TESSALON TAKE 1 CAPSULE BY MOUTH 3 TIMES DAILY ASNEEDED   buPROPion 150 MG 24 hr tablet Commonly known as: WELLBUTRIN XL TAKE 1 TABLET  BY MOUTH ONCE DAILY. *MAKEAPPOINTMENT FOR FURTHER FILLS*   buPROPion 150 MG 24 hr tablet Commonly known as: Wellbutrin XL Take 1 tablet (150 mg total) by mouth daily.   docusate sodium 100 MG capsule Commonly known as: COLACE Take 1 capsule (100 mg total) by mouth 2 (two) times daily. What changed:  when to take this reasons to take this   DULoxetine 60 MG capsule Commonly known as: CYMBALTA TAKE 1 CAPSULE BY MOUTH TWICE DAILY   fluconazole 150 MG tablet Commonly known as: DIFLUCAN Take 1 tablet by mouth, if symptoms still remain after 3 days- take additional tablet by mouth. What changed:  when to take this reasons to take this   fluticasone 50 MCG/ACT nasal spray Commonly known as: FLONASE USE 2 SPRAYS IN EACH NOSTRIL ONCE DAILY   gabapentin 300 MG capsule Commonly known as: NEURONTIN TAKE 3 CAPSULES BY MOUTH ONCE EVERY MORNING AND 5 CAPSULES AT BEDTIME What changed: See the new instructions.   glucose blood test strip Commonly known as: Contour Next Test To check blood sugar once daily   HYDROcodone-acetaminophen 7.5-325 MG tablet Commonly known as: NORCO Take 1-2 tablets by mouth every 4 (four) hours as needed for moderate pain (pain score 7-10). Replaces: HYDROcodone-acetaminophen 5-325 MG tablet   hydrOXYzine 10 MG tablet Commonly known as: ATARAX Take 1 tablet (10 mg total) by mouth 3 (three) times daily as needed.   ipratropium-albuterol 0.5-2.5 (3) MG/3ML Soln Commonly known as: DUONEB Take 3 mLs by nebulization every 6 (six) hours as needed.   levothyroxine 75 MCG tablet Commonly known as: SYNTHROID Take 1 tablet (75 mcg total) by mouth daily before breakfast.   loperamide 2 MG tablet Commonly known as: IMODIUM A-D Take 2-4 mg by mouth 4 (four) times daily as needed for diarrhea or loose stools.   meloxicam 15 MG tablet Commonly known as: MOBIC Take 1 tablet (15 mg total) by mouth daily.   metaxalone 800 MG tablet Commonly known as:  SKELAXIN Take 1 tablet (800 mg total) by mouth 3 (three) times daily.   metFORMIN 500 MG 24 hr tablet Commonly known as: GLUCOPHAGE-XR TAKE 2 TABLETS BY MOUTH ONCE A DAY WITH SUPPER   metoprolol succinate 50 MG 24 hr tablet Commonly known as: TOPROL-XL TAKE 1 TABLET BY MOUTH ONCE DAILY. TAKE WITH OR IMMEDIATELY FOLLOWING A MEAL   nystatin 100000 UNIT/ML suspension Commonly known as: MYCOSTATIN Take 5 mLs (500,000 Units total) by mouth 4 (four) times daily.   nystatin powder Commonly known as: MYCOSTATIN/NYSTOP Apply 1 application topically 3 (three) times daily. What changed:  when to take this reasons to take this  oxybutynin 5 MG 24 hr tablet Commonly known as: DITROPAN-XL Take 1 tablet (5 mg total) by mouth at bedtime.   primidone 50 MG tablet Commonly known as: MYSOLINE Take 50 mg by mouth 2 (two) times daily.   rosuvastatin 10 MG tablet Commonly known as: Crestor Take 1 tablet (10 mg total) by mouth daily.   theophylline 300 MG 24 hr capsule Commonly known as: Theo-24 Take 1 capsule (300 mg total) by mouth daily.   traZODone 100 MG tablet Commonly known as: DESYREL Take 2 tablets (200 mg total) by mouth at bedtime as needed for sleep.   Trelegy Ellipta 200-62.5-25 MCG/ACT Aepb Generic drug: Fluticasone-Umeclidin-Vilant Inhale 1 puff into the lungs 2 (two) times daily.   triamcinolone ointment 0.5 % Commonly known as: KENALOG Apply 1 application. topically 2 (two) times daily.   vitamin B-12 1000 MCG tablet Commonly known as: CYANOCOBALAMIN Take 1,000 mcg by mouth daily.   Vitamin D 50 MCG (2000 UT) tablet Take 2,000 Units by mouth daily.               Durable Medical Equipment  (From admission, onward)           Start     Ordered   01/12/22 1004  For home use only DME 3 n 1  Once        01/12/22 1003   01/12/22 1004  For home use only DME Walker rolling  Once       Question Answer Comment  Walker: With 5 Inch Wheels   Patient needs a  walker to treat with the following condition Osteoarthritis of right knee      01/12/22 1003            Diagnostic Studies: DG Knee Right Port  Result Date: 01/10/2022 CLINICAL DATA:  Status post total right knee arthroplasty. EXAM: PORTABLE RIGHT KNEE - 1-2 VIEW COMPARISON:  None Available. FINDINGS: Status post total knee arthroplasty. No perihardware lucency is seen to indicate hardware failure or loosening. Expected postoperative changes including moderate joint effusion, intra-articular and subcutaneous air and soft tissue swelling. Anterior surgical skin staples. No acute fracture or dislocation. IMPRESSION: Status post total right knee arthroplasty without evidence of hardware failure. Electronically Signed   By: Yvonne Kendall M.D.   On: 01/10/2022 11:07   [place] OR NERVE BLOCK-IMAGE ONLY [facility])  Result Date: 01/10/2022 There is no interpretation for this exam.  This order is for images obtained during a surgical procedure.  Please See "Surgeries" Tab for more information regarding the procedure.   CT CHEST LCS NODULE F/U LOW DOSE WO CONTRAST  Result Date: 12/22/2021 CLINICAL DATA:  66 year old female with 50 pack-year history of smoking. Lung cancer screening. EXAM: CT CHEST WITHOUT CONTRAST FOR LUNG CANCER SCREENING NODULE FOLLOW-UP TECHNIQUE: Multidetector CT imaging of the chest was performed following the standard protocol without IV contrast. RADIATION DOSE REDUCTION: This exam was performed according to the departmental dose-optimization program which includes automated exposure control, adjustment of the mA and/or kV according to patient size and/or use of iterative reconstruction technique. COMPARISON:  09/16/2021 FINDINGS: Cardiovascular: The heart size is normal. No substantial pericardial effusion. Mild atherosclerotic calcification is noted in the wall of the thoracic aorta. Mediastinum/Nodes: No mediastinal lymphadenopathy. No evidence for gross hilar lymphadenopathy although  assessment is limited by the lack of intravenous contrast on the current study. The esophagus has normal imaging features. There is no axillary lymphadenopathy. Lungs/Pleura: Centrilobular and paraseptal emphysema evident. Scattered tiny bilateral pulmonary nodules identified  previously are stable in the interval. Nodules identified previously is new in the medial left lower lobe have resolved in the interval consistent with infectious/inflammatory etiology. No new suspicious pulmonary nodule or mass. No focal airspace consolidation. No pleural effusion. Upper Abdomen: Unremarkable. Musculoskeletal: No worrisome lytic or sclerotic osseous abnormality. IMPRESSION: 1. Lung-RADS 2, benign appearance or behavior. Continue annual screening with low-dose chest CT without contrast in 12 months. 2. Aortic Atherosclerosis (ICD10-I70.0) and Emphysema (ICD10-J43.9). Electronically Signed   By: Misty Stanley M.D.   On: 12/22/2021 10:13    Disposition: Discharge disposition: 03-Skilled Nursing Facility            Signed: Carlynn Spry ,PA-C 01/13/2022, 6:45 AM

## 2022-01-13 NOTE — Progress Notes (Addendum)
0820 Pt has cleared PT and ok to d/c home  0835 Pt notified to call ride as she will be d/c this morning

## 2022-01-13 NOTE — Progress Notes (Signed)
  Subjective:  Patient reports pain as mild to moderate.  Patient seems more clear today.  Objective:   VITALS:   Vitals:   01/12/22 1527 01/12/22 1943 01/12/22 2344 01/13/22 0539  BP: (!) 129/58 (!) 135/55 (!) 139/52 106/72  Pulse: 74 81 81 79  Resp: $Remo'17 18 17 17  'TBddc$ Temp: 98.8 F (37.1 C) 99.2 F (37.3 C) 98.8 F (37.1 C) 98.9 F (37.2 C)  TempSrc: Oral     SpO2: 96% 100% 100% 95%  Weight:      Height:        PHYSICAL EXAM:  Neurologically intact ABD soft Neurovascular intact Sensation intact distally Intact pulses distally Dorsiflexion/Plantar flexion intact Incision: dressing C/D/I No cellulitis present Compartment soft  LABS  No results found for this or any previous visit (from the past 24 hour(s)).  No results found.  Assessment/Plan: 3 Days Post-Op   Principal Problem:   S/P TKR (total knee replacement) using cement, right   Advance diet Up with therapy Discharge home vs SNF once PT goals met RX in chart   Carlynn Spry , PA-C 01/13/2022, 6:42 AM

## 2022-01-13 NOTE — TOC Progression Note (Signed)
Transition of Care [facility]) - Progression Note    Patient Details  Name: Linda Bradford MRN: [id number] Date of Birth: [birth year]  Transition of Care [facility]) CM/SW [place], RN Phone Number: 01/13/2022, 8:28 AM  Clinical Narrative:   Patient doing much better today and will discharge with Home health services with [place], She has a rolling walker and 3 in 1 at home, She has Home [facility] aide at home for her husband that will pick her up from the hospital, no additional needs    Expected Discharge Plan: [place] Barriers to Discharge: Continued Medical Work up  Expected Discharge Plan and Services Expected Discharge Plan: [place] arrangements for the past 2 months: Single Family Home Expected Discharge Date: 01/13/22                                     Social Determinants of Health (SDOH) Interventions    Readmission Risk Interventions     No data to display

## 2022-01-14 ENCOUNTER — Telehealth: Payer: Self-pay

## 2022-01-14 NOTE — Telephone Encounter (Signed)
LVM for pt to return call and to make appt w/ PCP

## 2022-01-17 ENCOUNTER — Other Ambulatory Visit: Payer: Self-pay | Admitting: Family Medicine

## 2022-01-17 DIAGNOSIS — G629 Polyneuropathy, unspecified: Secondary | ICD-10-CM

## 2022-01-17 DIAGNOSIS — Z9109 Other allergy status, other than to drugs and biological substances: Secondary | ICD-10-CM

## 2022-01-18 NOTE — Telephone Encounter (Signed)
Requested Prescriptions  Pending Prescriptions Disp Refills  . gabapentin (NEURONTIN) 300 MG capsule [Pharmacy Med Name: GABAPENTIN 300 MG CAP] 240 capsule 5    Sig: TAKE 3 CAPSULES BY MOUTH ONCE EVERY MORNING AND 5 CAPSULES AT BEDTIME     Neurology: Anticonvulsants - gabapentin Passed - 01/17/2022  2:36 PM      Passed - Cr in normal range and within 360 days    Creat  Date Value Ref Range Status  05/19/2017 0.58 0.50 - 0.99 mg/dL Final    Comment:    For patients >44 years of age, the reference limit for Creatinine is approximately 13% higher for people identified as African-American. .    Creatinine, Ser  Date Value Ref Range Status  12/24/2021 0.67 0.44 - 1.00 mg/dL Final         Passed - Completed PHQ-2 or PHQ-9 in the last 360 days      Passed - Valid encounter within last 12 months    Recent Outpatient Visits          1 month ago Encounter for health maintenance examination   [facility] Thedore Mins, [place], PA-C   3 months ago Thrush of mouth and esophagus [facility])   [facility] Tally Joe T, FNP   3 months ago Dyspnea on exertion   [facility] Birdie Sons, MD   4 months ago Chronic obstructive pulmonary disease with acute exacerbation [facility])   [facility] Tally Joe T, FNP   9 months ago Diabetes mellitus without complication [facility])   [place], PA-C             . fluticasone (FLONASE) 50 MCG/ACT nasal spray [Pharmacy Med Name: FLUTICASONE PROPIONATE 50 MCG/ACT N] 48 g 1    Sig: USE 2 SPRAYS IN EACH NOSTRIL ONCE DAILY     Ear, Nose, and Throat: Nasal Preparations - Corticosteroids Passed - 01/17/2022  2:36 PM      Passed - Valid encounter within last 12 months    Recent Outpatient Visits          1 month ago Encounter for health maintenance examination   [facility] Thedore Mins, [place], PA-C   3 months ago Thrush of mouth and esophagus [facility])    [facility] Tally Joe T, FNP   3 months ago Dyspnea on exertion   [facility] Birdie Sons, MD   4 months ago Chronic obstructive pulmonary disease with acute exacerbation [facility])   [facility] Tally Joe T, FNP   9 months ago Diabetes mellitus without complication [facility])   [facility] Chrismon, Vickki Muff, [place]

## 2022-01-24 DIAGNOSIS — M25561 Pain in right knee: Secondary | ICD-10-CM | POA: Insufficient documentation

## 2022-01-31 ENCOUNTER — Telehealth: Payer: Self-pay

## 2022-01-31 NOTE — Telephone Encounter (Signed)
Copied from [place] [phone]. Topic: General - Other >> Jan 31, 2022  3:33 PM [place] B wrote: Reason for MZT:[id number] called in needs to reschedule AWV for July 6. Please call back

## 2022-02-03 ENCOUNTER — Ambulatory Visit: Payer: Medicare PPO

## 2022-02-03 DIAGNOSIS — M25661 Stiffness of right knee, not elsewhere classified: Secondary | ICD-10-CM | POA: Insufficient documentation

## 2022-02-11 NOTE — H&P (Signed)
Linda Bradford MRN:  [id number] DOB/SEX:  May 28, 1956/female  CHIEF COMPLAINT:  Painful right Knee  HISTORY:  Patient is a 66 y.o. female presented with a history of pain in the right knee. Onset of symptoms was gradual starting several years ago with gradually worsening course since that time. Prior procedures on the knee include none. Patient has been treated conservatively with over-the-counter NSAIDs and activity modification. Patient currently rates pain in the knee at 10 out of 10 with activity. There is pain at night.   PAST MEDICAL HISTORY: Patient Active Problem List   Diagnosis Date Noted   Pain in joint of right knee 01/24/2022   S/P TKR (total knee replacement) using cement, right 01/10/2022   Vulvar rash 12/07/2021   LVH (left ventricular hypertrophy) 10/24/2021   Aortic regurgitation 56/38/7564   Systolic murmur 33/29/5188   Thrush of mouth and esophagus ([place]) 10/01/2021   Skin yeast infection 10/01/2021   Vaginal yeast infection 10/01/2021   Current chronic use of inhaled steroid 10/01/2021   Chronic pain syndrome 10/01/2021   Mixed hyperlipidemia 10/01/2021   Nodule of left lung 09/20/2021   Hyperlipidemia associated with type 2 diabetes mellitus ([place]) 09/02/2021   Type 2 diabetes mellitus with hyperglycemia, without long-term current use of insulin ([place]) 09/02/2021   Hypertension associated with type 2 diabetes mellitus ([place]) 09/02/2021   Vaginal cyst 09/02/2021   COPD with exacerbation ([place]) 09/02/2021   Osteoarthritis of left knee 06/08/2021   History of total knee arthroplasty 05/17/2021   S/P total knee replacement, left 05/10/2021   Healthcare maintenance 05/03/2021   Contusion of coccyx 03/15/2021   Osteoarthritis of knees, bilateral 04/22/2020   Chronic obstructive pulmonary disease with acute exacerbation ([place]) 12/31/2019   Cough 12/31/2019   Biliary dyskinesia    Diverticulitis of large intestine with abscess 05/27/2018   Diabetes mellitus without  complication ([place]) 41/66/0630   Lateral epicondylitis 07/07/2017   Personal history of tobacco use, presenting hazards to health 05/03/2016   Bronchitis, chronic ([place]) 03/03/2015   Anxiety, generalized 11/05/2014   Alcohol abuse, in remission 11/05/2014   Nicotine addiction 11/05/2014   H/O: osteoarthritis 11/05/2014   Fibromyalgia 11/05/2014   Restless leg syndrome 11/05/2014   Arthralgia of hip 01/13/2014   Adiposity 01/13/2014   Disordered sleep 01/13/2014   Airway hyperreactivity 12/17/2013   Adult hypothyroidism 12/17/2013   Current tobacco use 12/17/2013   Basal cell carcinoma 11/04/2013   Arthropathia 02/08/2012   Arthritis of knee, left 02/08/2012   Past Medical History:  Diagnosis Date   Anemia    Anxiety    Arthritis    Cancer ([place])    skin cancer, removed   COPD (chronic obstructive pulmonary disease) ([place])    Depression    Diabetes mellitus without complication ([place])    Dyspnea    Dysrhythmia    occasional pvc's. not being treated.   Fatigue    GERD (gastroesophageal reflux disease)    Headache    Heart murmur    Hypertension    Hypothyroidism    Pneumonia    Thyroid disease    Tremors of nervous system    Past Surgical History:  Procedure Laterality Date   APPENDECTOMY     CHOLECYSTECTOMY N/A 07/01/2019   Procedure: LAPAROSCOPIC CHOLECYSTECTOMY;  Surgeon: Olean Ree, MD;  Location: [facility] ORS;  Service: General;  Laterality: N/A;   COLECTOMY WITH COLOSTOMY CREATION/HARTMANN PROCEDURE N/A 09/03/2018   Error, this was not done.   COLONOSCOPY WITH PROPOFOL N/A 03/08/2019   Procedure: COLONOSCOPY WITH  PROPOFOL;  Surgeon: Virgel Manifold, MD;  Location: [facility] ENDOSCOPY;  Service: Endoscopy;  Laterality: N/A;   CYSTOSCOPY WITH STENT PLACEMENT Bilateral 09/03/2018   Procedure: CYSTOSCOPY WITH STENT PLACEMENT-LIGHTED STENTS;  Surgeon: Hollice Espy, MD;  Location: [facility] ORS;  Service: Urology;  Laterality: Bilateral;   LAPAROSCOPIC SIGMOID COLECTOMY N/A  09/03/2018   Procedure: LAPAROSCOPIC SIGMOID COLECTOMY;  Surgeon: Olean Ree, MD;  Location: [facility] ORS;  Service: General;  Laterality: N/A;   percutaneous drainage tube  07/2018   2nd tube placed. not healing in colon, causing a fistula   TONSILLECTOMY     TOTAL KNEE ARTHROPLASTY Left 05/10/2021   Procedure: TOTAL KNEE ARTHROPLASTY;  Surgeon: Lovell Sheehan, MD;  Location: [facility] ORS;  Service: Orthopedics;  Laterality: Left;   TOTAL KNEE ARTHROPLASTY Right 01/10/2022   Procedure: TOTAL KNEE ARTHROPLASTY;  Surgeon: Lovell Sheehan, MD;  Location: [facility] ORS;  Service: Orthopedics;  Laterality: Right;   TUBAL LIGATION       MEDICATIONS:   No medications prior to admission.    ALLERGIES:   Allergies  Allergen Reactions   Dexilant [Dexlansoprazole] Nausea Only and Other (See Comments)    Dizziness    REVIEW OF SYSTEMS:  Pertinent items are noted in HPI.   FAMILY HISTORY:   Family History  Problem Relation Age of Onset   Lung cancer Mother    Alcohol abuse Sister    Arthritis Sister    Alcohol abuse Brother    Heart disease Brother 43   Heart attack Brother    Pancreatic cancer Maternal Aunt    Diabetes Maternal Aunt    Alcohol abuse Son    Drug abuse Son    Drug abuse Son    Heart disease Father 52   Diabetes Sister    Breast cancer Brother    Alcohol abuse Maternal Uncle    Alcohol abuse Maternal Grandmother    Diabetes Maternal Grandmother     SOCIAL HISTORY:   Social History   Tobacco Use   Smoking status: Every Day    Packs/day: 1.50    Years: 45.00    Total pack years: 67.50    Types: Cigarettes    Start date: 03/02/1980   Smokeless tobacco: Never  Substance Use Topics   Alcohol use: No    Alcohol/week: 0.0 standard drinks of alcohol     EXAMINATION:  Vital signs in last 24 hours:    General appearance: alert, cooperative, and no distress Neck: no JVD and supple, symmetrical, trachea midline Lungs: clear to auscultation bilaterally Heart: regular  rate and rhythm, S1, S2 normal, no murmur, click, rub or gallop Abdomen: soft, non-tender; bowel sounds normal; no masses,  no organomegaly Extremities: extremities normal, atraumatic, no cyanosis or edema and Homans sign is negative, no sign of DVT Pulses: 2+ and symmetric Skin: Skin color, texture, turgor normal. No rashes or lesions Neurologic: Alert and oriented X 3, normal strength and tone. Normal symmetric reflexes. Normal coordination and gait  Musculoskeletal:  ROM 0-100, Ligaments intact,  Imaging Review Plain radiographs demonstrate severe degenerative joint disease of the right knee. The overall alignment is significant varus. The bone quality appears to be good for age and reported activity level.    Assessment/Plan: Primary osteoarthritis, right knee   The patient history, physical examination and imaging studies are consistent with advanced degenerative joint disease of the right knee. The patient has failed conservative treatment.  The clearance notes were reviewed.  After discussion with the patient it was felt  that Total Knee Replacement was indicated. The procedure,  risks, and benefits of total knee arthroplasty were presented and reviewed. The risks including but not limited to aseptic loosening, infection, blood clots, vascular injury, stiffness, patella tracking problems complications among others were discussed. The patient acknowledged the explanation, agreed to proceed with the plan.  Carlynn Spry 02/11/2022, 5:50 AM

## 2022-02-19 ENCOUNTER — Other Ambulatory Visit: Payer: Self-pay | Admitting: Family Medicine

## 2022-02-19 DIAGNOSIS — F32A Depression, unspecified: Secondary | ICD-10-CM

## 2022-03-02 ENCOUNTER — Telehealth: Payer: Self-pay

## 2022-03-02 DIAGNOSIS — N3946 Mixed incontinence: Secondary | ICD-10-CM

## 2022-03-02 NOTE — Telephone Encounter (Signed)
Pt calling; states request for refill was declined; front desk told her to leave this msg.  [phone]

## 2022-03-07 NOTE — Telephone Encounter (Signed)
Called and left voicemail for patient to call back to be scheduled. 

## 2022-03-09 NOTE — Telephone Encounter (Signed)
Patient is scheduled for 8/29 with Dr. Hulan Fray. Patient is requesting refill on oxybutynin (DITROPAN-XL) 5 MG 24 hr tablet. To get her to her appointment is asking for refill. Please advise patient only ha 3 tablet.

## 2022-03-10 MED ORDER — OXYBUTYNIN CHLORIDE ER 5 MG PO TB24: 5.0000 mg | ORAL_TABLET | Freq: Every day | ORAL | 1 refills | Status: DC

## 2022-03-10 NOTE — Telephone Encounter (Signed)
done

## 2022-03-10 NOTE — Telephone Encounter (Signed)
Ok for refill? 

## 2022-03-17 DIAGNOSIS — M65342 Trigger finger, left ring finger: Secondary | ICD-10-CM | POA: Insufficient documentation

## 2022-03-19 ENCOUNTER — Other Ambulatory Visit: Payer: Self-pay | Admitting: Family Medicine

## 2022-03-19 DIAGNOSIS — I10 Essential (primary) hypertension: Secondary | ICD-10-CM

## 2022-03-19 DIAGNOSIS — G250 Essential tremor: Secondary | ICD-10-CM

## 2022-03-19 DIAGNOSIS — E119 Type 2 diabetes mellitus without complications: Secondary | ICD-10-CM

## 2022-03-19 DIAGNOSIS — F32A Depression, unspecified: Secondary | ICD-10-CM

## 2022-03-29 ENCOUNTER — Ambulatory Visit: Payer: Medicare PPO | Admitting: Obstetrics & Gynecology

## 2022-03-29 ENCOUNTER — Encounter: Payer: Self-pay | Admitting: Obstetrics & Gynecology

## 2022-03-29 DIAGNOSIS — N3946 Mixed incontinence: Secondary | ICD-10-CM

## 2022-03-29 MED ORDER — OXYBUTYNIN CHLORIDE ER 5 MG PO TB24: 5.0000 mg | ORAL_TABLET | Freq: Every day | ORAL | 5 refills | Status: DC

## 2022-03-29 NOTE — Progress Notes (Signed)
   Subjective:    Patient ID: Linda Bradford, female    DOB: Apr 30, 1956, 66 y.o.   MRN: [id number]  HPI 66 yo married P3 here for follow up after starting ditropan XL for urge incontinence. She reports that since she started it last month, she only rarely has incontinence. She still wears her panty liners just for reassurance.  She denies any side effects.   Review of Systems She has been married since 2000 but they are abstinent due to his health issues.    Objective:   Physical Exam Well nourished, well hydrated White female, no apparent distress She is ambulating and conversing normally.      Assessment & Plan:   Urge incontinece- good improvement with ditropan XL 5 mg I have refilled it for a year. She will come back next year or sooner as necessary.

## 2022-03-31 ENCOUNTER — Telehealth: Payer: Self-pay | Admitting: Family Medicine

## 2022-03-31 NOTE — Telephone Encounter (Signed)
Copied from [place] [id number]. Topic: Medicare AWV >> Mar 31, 2022  1:20 PM Jae Dire wrote: Reason for CRM:  Left message for patient to call back and schedule Medicare Annual Wellness Visit (AWV) in office.   If not able to come in the office, please offer to do virtually or by telephone.   No hx of AWV - AWV-I eligible per [facility] as of  12/30/2021  Please schedule at anytime with [facility] Health Advisor.   45 minute appointment  Any questions, please contact me at [phone]

## 2022-04-05 DIAGNOSIS — M7061 Trochanteric bursitis, right hip: Secondary | ICD-10-CM | POA: Insufficient documentation

## 2022-04-15 ENCOUNTER — Other Ambulatory Visit: Payer: Self-pay | Admitting: Family Medicine

## 2022-04-15 DIAGNOSIS — S39012A Strain of muscle, fascia and tendon of lower back, initial encounter: Secondary | ICD-10-CM

## 2022-04-18 ENCOUNTER — Ambulatory Visit (INDEPENDENT_AMBULATORY_CARE_PROVIDER_SITE_OTHER): Payer: Medicare PPO

## 2022-04-18 VITALS — BP 138/90 | Ht 70.0 in | Wt 215.9 lb

## 2022-04-18 DIAGNOSIS — Z1211 Encounter for screening for malignant neoplasm of colon: Secondary | ICD-10-CM | POA: Diagnosis not present

## 2022-04-18 DIAGNOSIS — Z0000 Encounter for general adult medical examination without abnormal findings: Secondary | ICD-10-CM

## 2022-04-18 DIAGNOSIS — Z780 Asymptomatic menopausal state: Secondary | ICD-10-CM

## 2022-04-18 DIAGNOSIS — Z23 Encounter for immunization: Secondary | ICD-10-CM

## 2022-04-18 NOTE — Patient Instructions (Signed)
Linda Bradford , Thank you for taking time to come for your Medicare Wellness Visit. I appreciate your ongoing commitment to your health goals. Please review the following plan we discussed and let me know if I can assist you in the future.   Screening recommendations/referrals: Colonoscopy: 03/08/19, referral sent today Mammogram: ordered 12/07/21 Bone Density: referral sent today Recommended yearly ophthalmology/optometry visit for glaucoma screening and checkup Recommended yearly dental visit for hygiene and checkup  Vaccinations: Influenza vaccine: 04/18/22 Pneumococcal vaccine: 05/11/21 Tdap vaccine: 08/24/17 Shingles vaccine: Zostavax 05/10/16   Covid-19:10/19/19, 11/09/19  Advanced directives: no  Conditions/risks identified: none  Next appointment: Follow up in one year for your annual wellness visit 04/20/23 @ 9:15 am in person   Preventive Care 65 Years and Older, Female Preventive care refers to lifestyle choices and visits with your health care provider that can promote health and wellness. What does preventive care include? A yearly physical exam. This is also called an annual well check. Dental exams once or twice a year. Routine eye exams. Ask your health care provider how often you should have your eyes checked. Personal lifestyle choices, including: Daily care of your teeth and gums. Regular physical activity. Eating a healthy diet. Avoiding tobacco and drug use. Limiting alcohol use. Practicing safe sex. Taking low-dose aspirin every day. Taking vitamin and mineral supplements as recommended by your health care provider. What happens during an annual well check? The services and screenings done by your health care provider during your annual well check will depend on your age, overall health, lifestyle risk factors, and family history of disease. Counseling  Your health care provider may ask you questions about your: Alcohol use. Tobacco use. Drug use. Emotional  well-being. Home and relationship well-being. Sexual activity. Eating habits. History of falls. Memory and ability to understand (cognition). Work and work Statistician. Reproductive health. Screening  You may have the following tests or measurements: Height, weight, and BMI. Blood pressure. Lipid and cholesterol levels. These may be checked every 5 years, or more frequently if you are over 47 years old. Skin check. Lung cancer screening. You may have this screening every year starting at age 53 if you have a 30-pack-year history of smoking and currently smoke or have quit within the past 15 years. Fecal occult blood test (FOBT) of the stool. You may have this test every year starting at age 46. Flexible sigmoidoscopy or colonoscopy. You may have a sigmoidoscopy every 5 years or a colonoscopy every 10 years starting at age 86. Hepatitis C blood test. Hepatitis B blood test. Sexually transmitted disease (STD) testing. Diabetes screening. This is done by checking your blood sugar (glucose) after you have not eaten for a while (fasting). You may have this done every 1-3 years. Bone density scan. This is done to screen for osteoporosis. You may have this done starting at age 63. Mammogram. This may be done every 1-2 years. Talk to your health care provider about how often you should have regular mammograms. Talk with your health care provider about your test results, treatment options, and if necessary, the need for more tests. Vaccines  Your health care provider may recommend certain vaccines, such as: Influenza vaccine. This is recommended every year. Tetanus, diphtheria, and acellular pertussis (Tdap, Td) vaccine. You may need a Td booster every 10 years. Zoster vaccine. You may need this after age 81. Pneumococcal 13-valent conjugate (PCV13) vaccine. One dose is recommended after age 49. Pneumococcal polysaccharide (PPSV23) vaccine. One dose is recommended after age  64. Talk to your  health care provider about which screenings and vaccines you need and how often you need them. This information is not intended to replace advice given to you by your health care provider. Make sure you discuss any questions you have with your health care provider. Document Released: 08/14/2015 Document Revised: 04/06/2016 Document Reviewed: 05/19/2015 Elsevier Interactive Patient Education  2017 [place] Prevention in the Home Falls can cause injuries. They can happen to people of all ages. There are many things you can do to make your home safe and to help prevent falls. What can I do on the outside of my home? Regularly fix the edges of walkways and driveways and fix any cracks. Remove anything that might make you trip as you walk through a door, such as a raised step or threshold. Trim any bushes or trees on the path to your home. Use bright outdoor lighting. Clear any walking paths of anything that might make someone trip, such as rocks or tools. Regularly check to see if handrails are loose or broken. Make sure that both sides of any steps have handrails. Any raised decks and porches should have guardrails on the edges. Have any leaves, snow, or ice cleared regularly. Use sand or salt on walking paths during winter. Clean up any spills in your garage right away. This includes oil or grease spills. What can I do in the bathroom? Use night lights. Install grab bars by the toilet and in the tub and shower. Do not use towel bars as grab bars. Use non-skid mats or decals in the tub or shower. If you need to sit down in the shower, use a plastic, non-slip stool. Keep the floor dry. Clean up any water that spills on the floor as soon as it happens. Remove soap buildup in the tub or shower regularly. Attach bath mats securely with double-sided non-slip rug tape. Do not have throw rugs and other things on the floor that can make you trip. What can I do in the bedroom? Use night  lights. Make sure that you have a light by your bed that is easy to reach. Do not use any sheets or blankets that are too big for your bed. They should not hang down onto the floor. Have a firm chair that has side arms. You can use this for support while you get dressed. Do not have throw rugs and other things on the floor that can make you trip. What can I do in the kitchen? Clean up any spills right away. Avoid walking on wet floors. Keep items that you use a lot in easy-to-reach places. If you need to reach something above you, use a strong step stool that has a grab bar. Keep electrical cords out of the way. Do not use floor polish or wax that makes floors slippery. If you must use wax, use non-skid floor wax. Do not have throw rugs and other things on the floor that can make you trip. What can I do with my stairs? Do not leave any items on the stairs. Make sure that there are handrails on both sides of the stairs and use them. Fix handrails that are broken or loose. Make sure that handrails are as long as the stairways. Check any carpeting to make sure that it is firmly attached to the stairs. Fix any carpet that is loose or worn. Avoid having throw rugs at the top or bottom of the stairs. If you do have  throw rugs, attach them to the floor with carpet tape. Make sure that you have a light switch at the top of the stairs and the bottom of the stairs. If you do not have them, ask someone to add them for you. What else can I do to help prevent falls? Wear shoes that: Do not have high heels. Have rubber bottoms. Are comfortable and fit you well. Are closed at the toe. Do not wear sandals. If you use a stepladder: Make sure that it is fully opened. Do not climb a closed stepladder. Make sure that both sides of the stepladder are locked into place. Ask someone to hold it for you, if possible. Clearly mark and make sure that you can see: Any grab bars or handrails. First and last  steps. Where the edge of each step is. Use tools that help you move around (mobility aids) if they are needed. These include: Canes. Walkers. Scooters. Crutches. Turn on the lights when you go into a dark area. Replace any light bulbs as soon as they burn out. Set up your furniture so you have a clear path. Avoid moving your furniture around. If any of your floors are uneven, fix them. If there are any pets around you, be aware of where they are. Review your medicines with your doctor. Some medicines can make you feel dizzy. This can increase your chance of falling. Ask your doctor what other things that you can do to help prevent falls. This information is not intended to replace advice given to you by your health care provider. Make sure you discuss any questions you have with your health care provider. Document Released: 05/14/2009 Document Revised: 12/24/2015 Document Reviewed: 08/22/2014 Elsevier Interactive Patient Education  2017 [employer].

## 2022-04-18 NOTE — Progress Notes (Signed)
Subjective:   Linda Bradford is a 66 y.o. female who presents for Medicare Annual (Subsequent) preventive examination.  Review of Systems           Objective:    Today's Vitals   04/18/22 1408  BP: (!) 138/90  Weight: 215 lb 14.4 oz (97.9 kg)  Height: '5\' 10"'$  (1.778 m)   Body mass index is 30.98 kg/m.     04/18/2022    2:24 PM 01/10/2022    5:54 PM 12/24/2021    2:47 PM 05/10/2021    6:30 PM 05/10/2021   10:47 AM 04/29/2021   10:31 AM 12/03/2019    6:22 PM  Advanced Directives  Does Patient Have a Medical Advance Directive? No Yes Yes Yes Yes Yes No  Type of Corporate treasurer of [place];Living will  Living will;Healthcare Power of Attorney     Does patient want to make changes to medical advance directive?  No - Patient declined  No - Patient declined  No - Patient declined   Copy of [place] in Chart?  Yes - validated most recent copy scanned in chart (See row information)  No - copy requested     Would patient like information on creating a medical advance directive? No - Patient declined          Current Medications (verified) Outpatient Encounter Medications as of 04/18/2022  Medication Sig   acetaminophen (TYLENOL) 500 MG tablet Take 1,000 mg by mouth every 6 (six) hours as needed for mild pain or moderate pain.    albuterol (VENTOLIN HFA) 108 (90 Base) MCG/ACT inhaler INHALE 2 PUFFS INTO THE LUNGS EVERY 4 HOURS AS NEEDED FOR WHEEZING OR SHORTNESS OF BREATH   aspirin 81 MG chewable tablet Chew 1 tablet (81 mg total) by mouth 2 (two) times daily.   buPROPion (WELLBUTRIN XL) 150 MG 24 hr tablet TAKE 1 TABLET BY MOUTH ONCE DAILY. *MAKEAPPOINTMENT FOR FURTHER FILLS*   Cholecalciferol (VITAMIN D) 50 MCG (2000 UT) tablet Take 2,000 Units by mouth daily.   diclofenac Sodium (VOLTAREN) 1 % GEL    DULoxetine (CYMBALTA) 60 MG capsule Take 1 capsule (60 mg total) by mouth 2 (two) times daily. Please schedule office visit before any future refill.    fluticasone (FLONASE) 50 MCG/ACT nasal spray USE 2 SPRAYS IN EACH NOSTRIL ONCE DAILY   Fluticasone-Umeclidin-Vilant (TRELEGY ELLIPTA) 200-62.5-25 MCG/ACT AEPB Inhale 1 puff into the lungs 2 (two) times daily.   gabapentin (NEURONTIN) 300 MG capsule TAKE 3 CAPSULES BY MOUTH ONCE EVERY MORNING AND 5 CAPSULES AT BEDTIME   glucose blood (CONTOUR NEXT TEST) test strip To check blood sugar once daily   ipratropium-albuterol (DUONEB) 0.5-2.5 (3) MG/3ML SOLN Take 3 mLs by nebulization every 6 (six) hours as needed.   levothyroxine (SYNTHROID) 75 MCG tablet Take 1 tablet (75 mcg total) by mouth daily before breakfast.   loperamide (IMODIUM A-D) 2 MG tablet Take 2-4 mg by mouth 4 (four) times daily as needed for diarrhea or loose stools.   meloxicam (MOBIC) 15 MG tablet Take 1 tablet (15 mg total) by mouth daily.   meloxicam (MOBIC) 7.5 MG tablet Take 1 tablet by mouth 2 (two) times daily with a meal.   metFORMIN (GLUCOPHAGE-XR) 500 MG 24 hr tablet TAKE 2 TABLETS BY MOUTH ONCE A DAY WITH SUPPER. Please schedule office visit before any future refill.   methocarbamol (ROBAXIN) 500 MG tablet    metoprolol succinate (TOPROL-XL) 50 MG 24 hr tablet Take  1 tablet (50 mg total) by mouth daily. Please schedule office visit before any future refill.   metoprolol tartrate (LOPRESSOR) 25 MG tablet    oxybutynin (DITROPAN-XL) 5 MG 24 hr tablet Take 1 tablet (5 mg total) by mouth at bedtime.   primidone (MYSOLINE) 50 MG tablet Take 50 mg by mouth 2 (two) times daily.   rosuvastatin (CRESTOR) 10 MG tablet Take 1 tablet (10 mg total) by mouth daily.   rosuvastatin (CRESTOR) 5 MG tablet    Spacer/Aero-Holding Chambers (AEROCHAMBER PLUS WITH MASK) inhaler    theophylline (THEO-24) 300 MG 24 hr capsule Take 1 capsule (300 mg total) by mouth daily.   traZODone (DESYREL) 100 MG tablet Take 2 tablets (200 mg total) by mouth at bedtime as needed for sleep.   traZODone (DESYREL) 50 MG tablet    triamcinolone ointment  (KENALOG) 0.5 % Apply 1 application. topically 2 (two) times daily.   vitamin B-12 (CYANOCOBALAMIN) 1000 MCG tablet Take 1,000 mcg by mouth daily.   benzonatate (TESSALON) 200 MG capsule TAKE 1 CAPSULE BY MOUTH 3 TIMES DAILY ASNEEDED (Patient not taking: Reported on 04/18/2022)   clonazePAM (KLONOPIN) 0.5 MG tablet  (Patient not taking: Reported on 04/18/2022)   diclofenac (VOLTAREN) 50 MG EC tablet Take 1 tablet twice a day by oral route. (Patient not taking: Reported on 04/18/2022)   docusate sodium (COLACE) 100 MG capsule Take 1 capsule (100 mg total) by mouth 2 (two) times daily. (Patient not taking: Reported on 04/18/2022)   ezetimibe (ZETIA) 10 MG tablet  (Patient not taking: Reported on 04/18/2022)   fluconazole (DIFLUCAN) 150 MG tablet Take 1 tablet by mouth, if symptoms still remain after 3 days- take additional tablet by mouth. (Patient not taking: Reported on 03/29/2022)   hydrOXYzine (ATARAX) 10 MG tablet Take 1 tablet (10 mg total) by mouth 3 (three) times daily as needed. (Patient not taking: Reported on 04/18/2022)   ibuprofen (ADVIL) 600 MG tablet  (Patient not taking: Reported on 04/18/2022)   metaxalone (SKELAXIN) 800 MG tablet TAKE 1 TABLET BY MOUTH 3 TIMES DAILY FORMUSCLE SPASMS (Patient not taking: Reported on 04/18/2022)   nitrofurantoin, macrocrystal-monohydrate, (MACROBID) 100 MG capsule  (Patient not taking: Reported on 04/18/2022)   nystatin (MYCOSTATIN) 100000 UNIT/ML suspension Take 5 mLs (500,000 Units total) by mouth 4 (four) times daily. (Patient not taking: Reported on 04/18/2022)   nystatin (MYCOSTATIN/NYSTOP) powder Apply 1 application topically 3 (three) times daily. (Patient not taking: Reported on 04/18/2022)   promethazine-dextromethorphan (PROMETHAZINE-DM) 6.25-15 MG/5ML syrup TAKE 5 MLS BY MOUTH EVERY 6 (SIX) HOURS AS NEEDED FOR COUGH MAXIMUM 30 ML / 24-HOUR (Patient not taking: Reported on 04/18/2022)   traMADol (ULTRAM) 50 MG tablet Take 1 tablet by mouth every 4 (four)  hours as needed. (Patient not taking: Reported on 04/18/2022)   varenicline (CHANTIX) 0.5 MG tablet  (Patient not taking: Reported on 04/18/2022)   [DISCONTINUED] buPROPion (WELLBUTRIN XL) 150 MG 24 hr tablet Take 1 tablet (150 mg total) by mouth daily.   [DISCONTINUED] buPROPion (WELLBUTRIN XL) 150 MG 24 hr tablet TAKE 1 TABLET BY MOUTH ONCE DAILY. *MAKEAPPOINTMENT FOR FURTHER FILLS*   No facility-administered encounter medications on file as of 04/18/2022.    Allergies (verified) Dexilant [dexlansoprazole]   History: Past Medical History:  Diagnosis Date   Anemia    Anxiety    Arthritis    Cancer ([place])    skin cancer, removed   COPD (chronic obstructive pulmonary disease) ([place])    Depression    Diabetes  mellitus without complication (HCC)    Dyspnea    Dysrhythmia    occasional pvc's. not being treated.   Fatigue    GERD (gastroesophageal reflux disease)    Headache    Heart murmur    Hypertension    Hypothyroidism    Pneumonia    Thyroid disease    Tremors of nervous system    Past Surgical History:  Procedure Laterality Date   APPENDECTOMY     CHOLECYSTECTOMY N/A 07/01/2019   Procedure: LAPAROSCOPIC CHOLECYSTECTOMY;  Surgeon: Olean Ree, MD;  Location: [facility] ORS;  Service: General;  Laterality: N/A;   COLECTOMY WITH COLOSTOMY CREATION/HARTMANN PROCEDURE N/A 09/03/2018   Error, this was not done.   COLONOSCOPY WITH PROPOFOL N/A 03/08/2019   Procedure: COLONOSCOPY WITH PROPOFOL;  Surgeon: Virgel Manifold, MD;  Location: [facility];  Service: Endoscopy;  Laterality: N/A;   CYSTOSCOPY WITH STENT PLACEMENT Bilateral 09/03/2018   Procedure: CYSTOSCOPY WITH STENT PLACEMENT-LIGHTED STENTS;  Surgeon: Hollice Espy, MD;  Location: [facility];  Service: Urology;  Laterality: Bilateral;   LAPAROSCOPIC SIGMOID COLECTOMY N/A 09/03/2018   Procedure: LAPAROSCOPIC SIGMOID COLECTOMY;  Surgeon: Olean Ree, MD;  Location: [facility];  Service: General;  Laterality: N/A;    percutaneous drainage tube  07/2018   2nd tube placed. not healing in colon, causing a fistula   TONSILLECTOMY     TOTAL KNEE ARTHROPLASTY Left 05/10/2021   Procedure: TOTAL KNEE ARTHROPLASTY;  Surgeon: Lovell Sheehan, MD;  Location: [facility];  Service: Orthopedics;  Laterality: Left;   TOTAL KNEE ARTHROPLASTY Right 01/10/2022   Procedure: TOTAL KNEE ARTHROPLASTY;  Surgeon: Lovell Sheehan, MD;  Location: [facility];  Service: Orthopedics;  Laterality: Right;   TUBAL LIGATION     Family History  Problem Relation Age of Onset   Lung cancer Mother    Alcohol abuse Sister    Arthritis Sister    Alcohol abuse Brother    Heart disease Brother 19   Heart attack Brother    Pancreatic cancer Maternal Aunt    Diabetes Maternal Aunt    Alcohol abuse Son    Drug abuse Son    Drug abuse Son    Heart disease Father 59   Diabetes Sister    Breast cancer Brother    Alcohol abuse Maternal Uncle    Alcohol abuse Maternal Grandmother    Diabetes Maternal Grandmother    Social History   Socioeconomic History   Marital status: Married    Spouse name: Clair Gulling   Number of children: 2   Years of education: Not on file   Highest education level: Not on file  Occupational History   Occupation: Careers information officer    Comment: currently employed  Tobacco Use   Smoking status: Every Day    Packs/day: 1.50    Years: 45.00    Total pack years: 67.50    Types: Cigarettes    Start date: 03/02/1980   Smokeless tobacco: Never  Vaping Use   Vaping Use: Former   Devices: only for a month. did not like  Substance and Sexual Activity   Alcohol use: No    Alcohol/week: 0.0 standard drinks of alcohol   Drug use: No   Sexual activity: Not Currently  Other Topics Concern   Not on file  Social History Narrative   Live with hubby at home, sister susie with down syndrome   Social Determinants of Health   Financial Resource Strain: Low Risk  (04/18/2022)   Overall Financial Resource Strain (  CARDIA)    Difficulty  of Paying Living Expenses: Not hard at all  Food Insecurity: No Food Insecurity (04/18/2022)   Hunger Vital Sign    Worried About Running Out of Food in the Last Year: Never true    Ran Out of Food in the Last Year: Never true  Transportation Needs: No Transportation Needs (04/18/2022)   PRAPARE - Hydrologist (Medical): No    Lack of Transportation (Non-Medical): No  Physical Activity: Insufficiently Active (04/18/2022)   Exercise Vital Sign    Days of Exercise per Week: 3 days    Minutes of Exercise per Session: 30 min  Stress: No Stress Concern Present (04/18/2022)   [place]    Feeling of Stress : Only a little  Social Connections: Socially Isolated (04/18/2022)   Social Connection and Isolation Panel [NHANES]    Frequency of Communication with Friends and Family: Once a week    Frequency of Social Gatherings with Friends and Family: Never    Attends Religious Services: Never    Printmaker: No    Attends Music therapist: Never    Marital Status: Married    Tobacco Counseling Ready to quit: Not Answered Counseling given: Not Answered   Clinical Intake:  Pre-visit preparation completed: Yes  Pain : No/denies pain     Nutritional Risks: None Diabetes: Yes CBG done?: No Did pt. bring in CBG monitor from home?: No  How often do you need to have someone help you when you read instructions, pamphlets, or other written materials from your doctor or pharmacy?: 1 - Never  Diabetic?yes Nutrition Risk Assessment:  Has the patient had any N/V/D within the last 2 months?  No  Does the patient have any non-healing wounds?  No  Has the patient had any unintentional weight loss or weight gain?  No   Diabetes:  Is the patient diabetic?  Yes  If diabetic, was a CBG obtained today?  No  Did the patient bring in their glucometer from home?  No   How often do you monitor your CBG's? Every third day.   Financial Strains and Diabetes Management:  Are you having any financial strains with the device, your supplies or your medication? No .  Does the patient want to be seen by Chronic Care Management for management of their diabetes?  No  Would the patient like to be referred to a Nutritionist or for Diabetic Management?  No   Diabetic Exams:  Diabetic Eye Exam: Completed 09/06/21.  Pt has been advised about the importance in completing this exam.  Diabetic Foot Exam: Completed 04/01/21. Pt has been advised about the importance in completing this exam.   Interpreter Needed?: No  Information entered by :: Kirke Shaggy, LPN   Activities of Daily Living    01/10/2022    5:58 PM 01/10/2022    5:57 PM  In your present state of health, do you have any difficulty performing the following activities:  Hearing?  0  Vision?  0  Difficulty concentrating or making decisions?  0  Walking or climbing stairs?  1  Dressing or bathing?  0  Doing errands, shopping? 0     Patient Care Team: [name], FNP as PCP - General (Family Medicine) [facility], [facility])  Indicate any recent Medical Services you may have received from other than [facility] providers in the past year (  date may be approximate).     Assessment:   This is a routine wellness examination for [place].  Hearing/Vision screen Hearing Screening - Comments:: No aids  Vision Screening - Comments:: Wears glasses- [facility]  Dietary issues and exercise activities discussed:     Goals Addressed             This Visit's Progress    DIET - EAT MORE FRUITS AND VEGETABLES         Depression Screen    04/18/2022    2:21 PM 12/07/2021    2:35 PM 09/02/2021    1:47 PM 04/01/2021    1:38 PM 09/15/2020    2:03 PM 06/24/2020    2:44 PM 04/29/2020    6:43 PM  PHQ 2/9 Scores  PHQ - 2 Score 1 0  0 0 2 0  PHQ- 9 Score 1 0  0 0 5 0  Exception Documentation    Patient refusal        Fall Risk    04/18/2022    2:25 PM 12/07/2021    2:35 PM 04/01/2021    1:37 PM 09/15/2020    2:03 PM 06/24/2020    2:44 PM  [place] in the past year? 0 1 1 0 0  Number falls in past yr: 0 1  0 0  Comment   3    Injury with Fall? 0 1 1 0 0  Risk for fall due to : No Fall Risks History of fall(s) History of fall(s) No Fall Risks No Fall Risks  Follow up Falls prevention discussed   Falls evaluation completed Falls evaluation completed    [place]:  Any stairs in or around the home? No  If so, are there any without handrails? No  Home free of loose throw rugs in walkways, pet beds, electrical cords, etc? Yes  Adequate lighting in your home to reduce risk of falls? Yes   ASSISTIVE DEVICES UTILIZED TO PREVENT FALLS:  Life alert? No  Use of a cane, walker or w/c? No  Grab bars in the bathroom? Yes  Shower chair or bench in shower? Yes  Elevated toilet seat or a handicapped toilet? Yes   TIMED UP AND GO:  Was the test performed? Yes .  Length of time to ambulate 10 feet: 4 sec.   Gait steady and fast without use of assistive device  Cognitive Function:        Immunizations Immunization History  Administered Date(s) Administered   Fluad Quad(high Dose 65+) 05/11/2021   Influenza,inj,Quad PF,6+ Mos 05/10/2017, 04/29/2020   PFIZER(Purple Top)SARS-COV-2 Vaccination 10/19/2019, 11/09/2019   Pneumococcal Conjugate-13 05/10/2016   Pneumococcal Polysaccharide-23 05/11/2021   Tdap 08/24/2017   Zoster, Live 05/10/2016    TDAP status: Up to date  Flu Vaccine status: Completed at today's visit  Pneumococcal vaccine status: Up to date  Covid-19 vaccine status: Completed vaccines  Qualifies for Shingles Vaccine? Yes   Zostavax completed Yes   Shingrix Completed?: No.    Education has been provided regarding the importance of this vaccine. Patient has been advised to call insurance company to determine out of  pocket expense if they have not yet received this vaccine. Advised may also receive vaccine at local pharmacy or Health Dept. Verbalized acceptance and understanding.  Screening Tests Health Maintenance  Topic Date Due   Zoster Vaccines- Shingrix (1 of 2) Never done   COVID-19 Vaccine (3 - Pfizer risk series)  12/07/2019   MAMMOGRAM  05/22/2021   INFLUENZA VACCINE  03/01/2022   COLONOSCOPY (Pts 45-69yr Insurance coverage will need to be confirmed)  03/07/2022   Diabetic kidney evaluation - Urine ACR  04/06/2022   FOOT EXAM  04/01/2022   DEXA SCAN  09/02/2022 (Originally 01/03/2021)   HEMOGLOBIN A1C  06/09/2022   OPHTHALMOLOGY EXAM  09/06/2022   Diabetic kidney evaluation - GFR measurement  12/25/2022   TETANUS/TDAP  08/25/2027   Pneumonia Vaccine 66 Years old  Completed   Hepatitis C Screening  Completed   HPV VACCINES  Aged Out    Health Maintenance  Health Maintenance Due  Topic Date Due   Zoster Vaccines- Shingrix (1 of 2) Never done   COVID-19 Vaccine (3 - Pfizer risk series) 12/07/2019   MAMMOGRAM  05/22/2021   INFLUENZA VACCINE  03/01/2022   COLONOSCOPY (Pts 45-464yrInsurance coverage will need to be confirmed)  03/07/2022   Diabetic kidney evaluation - Urine ACR  04/06/2022   FOOT EXAM  04/01/2022    Colorectal cancer screening: Type of screening: Colonoscopy. Completed 03/08/19. Repeat every 3 years  Mammogram status: Ordered 12/07/21. Pt provided with contact info and advised to call to schedule appt.   Bone Density status: Ordered today. Pt provided with contact info and advised to call to schedule appt.  Lung Cancer Screening: (Low Dose CT Chest recommended if Age 771-80ears, 30 pack-year currently smoking OR have quit w/in 15years.) does qualify.   Lung Cancer Screening Referral: sent today  Additional Screening:  Hepatitis C Screening: does qualify; Completed 05/19/17  Vision Screening: Recommended annual ophthalmology exams for early detection of glaucoma  and other disorders of the eye. Is the patient up to date with their annual eye exam?  Yes  Who is the provider or what is the name of the office in which the patient attends annual eye exams? [place] with a provider, would they like to be referred to a provider to establish care? No .   Dental Screening: Recommended annual dental exams for proper oral hygiene  Community Resource Referral / Chronic Care Management: CRR required this visit?  No   CCM required this visit?  No      Plan:     I have personally reviewed and noted the following in the patient's chart:   Medical and social history Use of alcohol, tobacco or illicit drugs  Current medications and supplements including opioid prescriptions. Patient is not currently taking opioid prescriptions. Functional ability and status Nutritional status Physical activity Advanced directives List of other physicians Hospitalizations, surgeries, and ER visits in previous 12 months Vitals Screenings to include cognitive, depression, and falls Referrals and appointments  In addition, I have reviewed and discussed with patient certain preventive protocols, quality metrics, and best practice recommendations. A written personalized care plan for preventive services as well as general preventive health recommendations were provided to patient.     LoDionisio DavidLPN   04/07/40/6384 Nurse Notes: none

## 2022-04-22 ENCOUNTER — Telehealth: Payer: Self-pay

## 2022-04-22 NOTE — Telephone Encounter (Signed)
Returned patients call to schedule her colonoscopy.  Last colonoscopy was performed with Dr. Bonna Gains 03/08/2019 polyps were noted.  LVM for her to call back.  Thanks,  [place], [place]

## 2022-04-25 ENCOUNTER — Other Ambulatory Visit: Payer: Self-pay | Admitting: Family Medicine

## 2022-04-25 DIAGNOSIS — F411 Generalized anxiety disorder: Secondary | ICD-10-CM

## 2022-04-25 DIAGNOSIS — F331 Major depressive disorder, recurrent, moderate: Secondary | ICD-10-CM

## 2022-04-26 ENCOUNTER — Other Ambulatory Visit: Payer: Self-pay

## 2022-04-26 ENCOUNTER — Telehealth: Payer: Self-pay

## 2022-04-26 DIAGNOSIS — Z86010 Personal history of colonic polyps: Secondary | ICD-10-CM

## 2022-04-26 MED ORDER — NA SULFATE-K SULFATE-MG SULF 17.5-3.13-1.6 GM/177ML PO SOLN: 354.0000 mL | Freq: Once | ORAL | 0 refills | Status: AC

## 2022-04-26 NOTE — Telephone Encounter (Signed)
Gastroenterology Pre-Procedure Review  Request Date: 05/18/2022 Requesting Physician: Dr. Marius Ditch  PATIENT REVIEW QUESTIONS: The patient responded to the following health history questions as indicated:    1. Are you having any GI issues? no 2. Do you have a personal history of Polyps? Yes last 03/08/2019 3. Do you have a family history of Colon Cancer or Polyps? Paternal Aunt   4. Diabetes Mellitus? Yes Metformin  5. Joint replacements in the past 12 months?Knee 10/22 knee in June of 2023 6. Major health problems in the past 3 months?no 7. Any artificial heart valves, MVP, or defibrillator?no    MEDICATIONS & ALLERGIES:    Patient reports the following regarding taking any anticoagulation/antiplatelet therapy:   Plavix, Coumadin, Eliquis, Xarelto, Lovenox, Pradaxa, Brilinta, or Effient? no Aspirin? '81mg'$  daily   Patient confirms/reports the following medications:  Current Outpatient Medications  Medication Sig Dispense Refill   acetaminophen (TYLENOL) 500 MG tablet Take 1,000 mg by mouth every 6 (six) hours as needed for mild pain or moderate pain.      albuterol (VENTOLIN HFA) 108 (90 Base) MCG/ACT inhaler INHALE 2 PUFFS INTO THE LUNGS EVERY 4 HOURS AS NEEDED FOR WHEEZING OR SHORTNESS OF BREATH 8.5 g 3   aspirin 81 MG chewable tablet Chew 1 tablet (81 mg total) by mouth 2 (two) times daily. 60 tablet 0   benzonatate (TESSALON) 200 MG capsule TAKE 1 CAPSULE BY MOUTH 3 TIMES DAILY ASNEEDED (Patient not taking: Reported on 04/18/2022) 90 capsule 0   buPROPion (WELLBUTRIN XL) 150 MG 24 hr tablet TAKE 1 TABLET BY MOUTH ONCE DAILY. *MAKEAPPOINTMENT FOR FURTHER FILLS* 30 tablet 0   Cholecalciferol (VITAMIN D) 50 MCG (2000 UT) tablet Take 2,000 Units by mouth daily.     clonazePAM (KLONOPIN) 0.5 MG tablet  (Patient not taking: Reported on 04/18/2022)     diclofenac (VOLTAREN) 50 MG EC tablet Take 1 tablet twice a day by oral route. (Patient not taking: Reported on 04/18/2022)     diclofenac Sodium  (VOLTAREN) 1 % GEL      docusate sodium (COLACE) 100 MG capsule Take 1 capsule (100 mg total) by mouth 2 (two) times daily. (Patient not taking: Reported on 04/18/2022) 30 capsule 0   DULoxetine (CYMBALTA) 60 MG capsule Take 1 capsule (60 mg total) by mouth 2 (two) times daily. Please schedule office visit before any future refill. 60 capsule 0   ezetimibe (ZETIA) 10 MG tablet  (Patient not taking: Reported on 04/18/2022)     fluconazole (DIFLUCAN) 150 MG tablet Take 1 tablet by mouth, if symptoms still remain after 3 days- take additional tablet by mouth. (Patient not taking: Reported on 03/29/2022) 2 tablet 0   fluticasone (FLONASE) 50 MCG/ACT nasal spray USE 2 SPRAYS IN EACH NOSTRIL ONCE DAILY 48 g 1   Fluticasone-Umeclidin-Vilant (TRELEGY ELLIPTA) 200-62.5-25 MCG/ACT AEPB Inhale 1 puff into the lungs 2 (two) times daily. 60 each 11   gabapentin (NEURONTIN) 300 MG capsule TAKE 3 CAPSULES BY MOUTH ONCE EVERY MORNING AND 5 CAPSULES AT BEDTIME 240 capsule 5   glucose blood (CONTOUR NEXT TEST) test strip To check blood sugar once daily 100 each 12   hydrOXYzine (ATARAX) 10 MG tablet Take 1 tablet (10 mg total) by mouth 3 (three) times daily as needed. (Patient not taking: Reported on 04/18/2022) 30 tablet 0   ibuprofen (ADVIL) 600 MG tablet  (Patient not taking: Reported on 04/18/2022)     ipratropium-albuterol (DUONEB) 0.5-2.5 (3) MG/3ML SOLN Take 3 mLs by nebulization every 6 (  six) hours as needed. 360 mL 11   levothyroxine (SYNTHROID) 75 MCG tablet Take 1 tablet (75 mcg total) by mouth daily before breakfast. 90 tablet 3   loperamide (IMODIUM A-D) 2 MG tablet Take 2-4 mg by mouth 4 (four) times daily as needed for diarrhea or loose stools.     meloxicam (MOBIC) 15 MG tablet Take 1 tablet (15 mg total) by mouth daily. 90 tablet 1   meloxicam (MOBIC) 7.5 MG tablet Take 1 tablet by mouth 2 (two) times daily with a meal.     metaxalone (SKELAXIN) 800 MG tablet TAKE 1 TABLET BY MOUTH 3 TIMES DAILY FORMUSCLE  SPASMS (Patient not taking: Reported on 04/18/2022) 270 tablet 0   metFORMIN (GLUCOPHAGE-XR) 500 MG 24 hr tablet TAKE 2 TABLETS BY MOUTH ONCE A DAY WITH SUPPER. Please schedule office visit before any future refill. 60 tablet 0   methocarbamol (ROBAXIN) 500 MG tablet      metoprolol succinate (TOPROL-XL) 50 MG 24 hr tablet Take 1 tablet (50 mg total) by mouth daily. Please schedule office visit before any future refill. 30 tablet 0   metoprolol tartrate (LOPRESSOR) 25 MG tablet      nitrofurantoin, macrocrystal-monohydrate, (MACROBID) 100 MG capsule  (Patient not taking: Reported on 04/18/2022)     nystatin (MYCOSTATIN) 100000 UNIT/ML suspension Take 5 mLs (500,000 Units total) by mouth 4 (four) times daily. (Patient not taking: Reported on 04/18/2022) 473 mL 0   nystatin (MYCOSTATIN/NYSTOP) powder Apply 1 application topically 3 (three) times daily. (Patient not taking: Reported on 04/18/2022) 60 g 0   oxybutynin (DITROPAN-XL) 5 MG 24 hr tablet Take 1 tablet (5 mg total) by mouth at bedtime. 90 tablet 5   primidone (MYSOLINE) 50 MG tablet Take 50 mg by mouth 2 (two) times daily.     promethazine-dextromethorphan (PROMETHAZINE-DM) 6.25-15 MG/5ML syrup TAKE 5 MLS BY MOUTH EVERY 6 (SIX) HOURS AS NEEDED FOR COUGH MAXIMUM 30 ML / 24-HOUR (Patient not taking: Reported on 04/18/2022)     rosuvastatin (CRESTOR) 10 MG tablet Take 1 tablet (10 mg total) by mouth daily. 90 tablet 3   rosuvastatin (CRESTOR) 5 MG tablet      Spacer/Aero-Holding Chambers (AEROCHAMBER PLUS WITH MASK) inhaler      theophylline (THEO-24) 300 MG 24 hr capsule Take 1 capsule (300 mg total) by mouth daily. 90 capsule 1   traMADol (ULTRAM) 50 MG tablet Take 1 tablet by mouth every 4 (four) hours as needed. (Patient not taking: Reported on 04/18/2022)     traZODone (DESYREL) 100 MG tablet Take 2 tablets (200 mg total) by mouth at bedtime as needed for sleep. 180 tablet 3   traZODone (DESYREL) 50 MG tablet      triamcinolone ointment  (KENALOG) 0.5 % Apply 1 application. topically 2 (two) times daily. 30 g 0   varenicline (CHANTIX) 0.5 MG tablet  (Patient not taking: Reported on 04/18/2022)     vitamin B-12 (CYANOCOBALAMIN) 1000 MCG tablet Take 1,000 mcg by mouth daily.     No current facility-administered medications for this visit.    Patient confirms/reports the following allergies:  Allergies  Allergen Reactions   Dexilant [Dexlansoprazole] Nausea Only and Other (See Comments)    Dizziness    No orders of the defined types were placed in this encounter.   AUTHORIZATION INFORMATION Primary Insurance: 1D#: Group #:  Secondary Insurance: 1D#: Group #:  SCHEDULE INFORMATION: Date:  Time: Location:

## 2022-04-27 ENCOUNTER — Telehealth: Payer: Self-pay

## 2022-04-27 MED ORDER — CLENPIQ 10-3.5-12 MG-GM -GM/175ML PO SOLN: 175.0000 mL | Freq: Once | ORAL | 0 refills | Status: AC

## 2022-04-27 NOTE — Telephone Encounter (Signed)
Insurance will not cover suprep but will cover Clenpiq. Sent this to the pharmacy

## 2022-04-29 ENCOUNTER — Telehealth: Payer: Self-pay

## 2022-04-29 NOTE — Telephone Encounter (Signed)
Already taken care of

## 2022-04-29 NOTE — Telephone Encounter (Addendum)
Per rep at pharmacy need more information on refill. I think it was old message. I see you had a note about medication not covering it and another was sent.  I went back look at phone log call was yesterday.

## 2022-05-03 ENCOUNTER — Other Ambulatory Visit: Payer: Self-pay | Admitting: Physician Assistant

## 2022-05-03 ENCOUNTER — Telehealth: Payer: Self-pay | Admitting: Family Medicine

## 2022-05-03 DIAGNOSIS — J418 Mixed simple and mucopurulent chronic bronchitis: Secondary | ICD-10-CM

## 2022-05-03 NOTE — Telephone Encounter (Signed)
Linda Bradford with [facility] called saying the theo-24 30 mg capsules and it is on long term back order so they would like to use Theophylline er tab 300 mg.  Can they get an or approval to sub.  CB@  [phone]

## 2022-05-03 NOTE — Telephone Encounter (Signed)
Patient advised as below.  

## 2022-05-05 NOTE — Telephone Encounter (Addendum)
[facility], Judeen Hammans, states Linda Bradford is due for her medication now, last picked up 8/01, Linda Bradford got 60 tabs. Judeen Hammans recommends you give Linda Bradford a call and let her know why dr is denying.  Judeen Hammans states not having anything will be more detrimental to her breathing than not having anything.

## 2022-05-16 ENCOUNTER — Ambulatory Visit: Payer: Medicare PPO | Admitting: Student in an Organized Health Care Education/Training Program

## 2022-05-16 ENCOUNTER — Encounter: Payer: Self-pay | Admitting: Student in an Organized Health Care Education/Training Program

## 2022-05-16 ENCOUNTER — Ambulatory Visit
Admission: RE | Admit: 2022-05-16 | Discharge: 2022-05-16 | Disposition: A | Discharge status: Home / Self Care | Payer: Medicare PPO | Source: Ambulatory Visit | Attending: Student in an Organized Health Care Education/Training Program | Admitting: Student in an Organized Health Care Education/Training Program

## 2022-05-16 ENCOUNTER — Other Ambulatory Visit
Admission: RE | Admit: 2022-05-16 | Discharge: 2022-05-16 | Disposition: A | Discharge status: Home / Self Care | Payer: Medicare PPO | Source: Ambulatory Visit | Attending: Student in an Organized Health Care Education/Training Program | Admitting: Student in an Organized Health Care Education/Training Program

## 2022-05-16 VITALS — BP 132/78 | HR 71 | Temp 97.9°F | Ht 70.0 in | Wt 220.8 lb

## 2022-05-16 DIAGNOSIS — R0602 Shortness of breath: Secondary | ICD-10-CM | POA: Diagnosis present

## 2022-05-16 LAB — NITRIC OXIDE: Nitric Oxide: 5

## 2022-05-16 MED ORDER — PREDNISONE 20 MG PO TABS: 20.0000 mg | ORAL_TABLET | Freq: Every day | ORAL | 0 refills | Status: AC

## 2022-05-16 MED ORDER — AMOXICILLIN-POT CLAVULANATE 875-125 MG PO TABS: 1.0000 | ORAL_TABLET | Freq: Two times a day (BID) | ORAL | 0 refills | Status: AC

## 2022-05-16 NOTE — Progress Notes (Signed)
Synopsis: Referred in for shortness of breath by [name], PA-C  Assessment & Plan:   #COPD #SOB (shortness of breath)  Linda Bradford has a history of symptoms consistent with COPD given the wheezing, cough, and shortness of breath. She has had CT scans of the chest for lung cancer screening that are notable for paraseptal and centri-lobular emphysema. She does describe symptoms of childhood bronchitis which could suggest an Asthma/COPD overlap, albeit FENO today was low at <5 ppb.  For workup, she'll need a pulmonary function test as well as a CXR today. I will continue her Trelegy pending the PFT's. That said, her current constellation of symptoms are concerning for a COPD exacerbation which I will treat with a course of antibiotics (Augmentin, also sending sputum culture today) and short course of prednisone. Given the narrow therapeutic window of theophyline and its multiple side effects, I will err on the side of keeping it discontinued for the time being.  - Nitric oxide - Pulmonary Function Test [facility] Only; Future - amoxicillin-clavulanate (AUGMENTIN) 875-125 MG tablet; Take 1 tablet by mouth 2 (two) times daily for 7 days.  Dispense: 14 tablet; Refill: 0 - predniSONE (DELTASONE) 20 MG tablet; Take 1 tablet (20 mg total) by mouth daily with breakfast for 5 days.  Dispense: 5 tablet; Refill: 0 - Culture, respiratory; Future - DG Chest 2 View; Future - Continue with lung cancer screening  Return in about 4 weeks (around 06/13/2022).  I spent 60 minutes caring for this patient today, including preparing to see the patient, obtaining and/or reviewing separately obtained history, performing a medically appropriate examination and/or evaluation, counseling and educating the patient/family/caregiver, ordering medications, tests, or procedures, and documenting clinical information in the electronic health record  Armando Reichert, MD [facility] 05/16/2022 12:38 PM     End of visit medications:  Meds ordered this encounter  Medications   amoxicillin-clavulanate (AUGMENTIN) 875-125 MG tablet    Sig: Take 1 tablet by mouth 2 (two) times daily for 7 days.    Dispense:  14 tablet    Refill:  0   predniSONE (DELTASONE) 20 MG tablet    Sig: Take 1 tablet (20 mg total) by mouth daily with breakfast for 5 days.    Dispense:  5 tablet    Refill:  0     Current Outpatient Medications:    acetaminophen (TYLENOL) 500 MG tablet, Take 1,000 mg by mouth every 6 (six) hours as needed for mild pain or moderate pain. , Disp: , Rfl:    albuterol (VENTOLIN HFA) 108 (90 Base) MCG/ACT inhaler, INHALE 2 PUFFS INTO THE LUNGS EVERY 4 HOURS AS NEEDED FOR WHEEZING OR SHORTNESS OF BREATH, Disp: 8.5 g, Rfl: 3   amoxicillin-clavulanate (AUGMENTIN) 875-125 MG tablet, Take 1 tablet by mouth 2 (two) times daily for 7 days., Disp: 14 tablet, Rfl: 0   aspirin 81 MG chewable tablet, Chew 1 tablet (81 mg total) by mouth 2 (two) times daily., Disp: 60 tablet, Rfl: 0   benzonatate (TESSALON) 200 MG capsule, TAKE 1 CAPSULE BY MOUTH 3 TIMES DAILY ASNEEDED, Disp: 90 capsule, Rfl: 0   buPROPion (WELLBUTRIN XL) 150 MG 24 hr tablet, TAKE 1 TABLET BY MOUTH ONCE DAILY. *MAKEAPPOINTMENT FOR FURTHER FILLS*, Disp: 30 tablet, Rfl: 0   Cholecalciferol (VITAMIN D) 50 MCG (2000 UT) tablet, Take 2,000 Units by mouth daily., Disp: , Rfl:    diclofenac (VOLTAREN) 50 MG EC tablet, , Disp: , Rfl:    diclofenac Sodium (VOLTAREN)  1 % GEL, , Disp: , Rfl:    DULoxetine (CYMBALTA) 60 MG capsule, Take 1 capsule (60 mg total) by mouth 2 (two) times daily. Please schedule office visit before any future refill., Disp: 60 capsule, Rfl: 0   ezetimibe (ZETIA) 10 MG tablet, , Disp: , Rfl:    fluconazole (DIFLUCAN) 150 MG tablet, Take 1 tablet by mouth, if symptoms still remain after 3 days- take additional tablet by mouth., Disp: 2 tablet, Rfl: 0   fluticasone (FLONASE) 50 MCG/ACT nasal spray, USE 2 SPRAYS IN EACH  NOSTRIL ONCE DAILY, Disp: 48 g, Rfl: 1   Fluticasone-Umeclidin-Vilant (TRELEGY ELLIPTA) 200-62.5-25 MCG/ACT AEPB, Inhale 1 puff into the lungs 2 (two) times daily., Disp: 60 each, Rfl: 11   gabapentin (NEURONTIN) 300 MG capsule, TAKE 3 CAPSULES BY MOUTH ONCE EVERY MORNING AND 5 CAPSULES AT BEDTIME, Disp: 240 capsule, Rfl: 5   glucose blood (CONTOUR NEXT TEST) test strip, To check blood sugar once daily, Disp: 100 each, Rfl: 12   hydrOXYzine (ATARAX) 10 MG tablet, Take 1 tablet (10 mg total) by mouth 3 (three) times daily as needed., Disp: 30 tablet, Rfl: 0   ipratropium-albuterol (DUONEB) 0.5-2.5 (3) MG/3ML SOLN, Take 3 mLs by nebulization every 6 (six) hours as needed., Disp: 360 mL, Rfl: 11   levothyroxine (SYNTHROID) 75 MCG tablet, Take 1 tablet (75 mcg total) by mouth daily before breakfast., Disp: 90 tablet, Rfl: 3   loperamide (IMODIUM A-D) 2 MG tablet, Take 2-4 mg by mouth 4 (four) times daily as needed for diarrhea or loose stools., Disp: , Rfl:    metaxalone (SKELAXIN) 800 MG tablet, TAKE 1 TABLET BY MOUTH 3 TIMES DAILY FORMUSCLE SPASMS, Disp: 270 tablet, Rfl: 0   metFORMIN (GLUCOPHAGE-XR) 500 MG 24 hr tablet, TAKE 2 TABLETS BY MOUTH ONCE A DAY WITH SUPPER. Please schedule office visit before any future refill., Disp: 60 tablet, Rfl: 0   nitrofurantoin, macrocrystal-monohydrate, (MACROBID) 100 MG capsule, , Disp: , Rfl:    nystatin (MYCOSTATIN) 100000 UNIT/ML suspension, Take 5 mLs (500,000 Units total) by mouth 4 (four) times daily., Disp: 473 mL, Rfl: 0   nystatin (MYCOSTATIN/NYSTOP) powder, Apply 1 application topically 3 (three) times daily., Disp: 60 g, Rfl: 0   oxybutynin (DITROPAN-XL) 5 MG 24 hr tablet, Take 1 tablet (5 mg total) by mouth at bedtime., Disp: 90 tablet, Rfl: 5   predniSONE (DELTASONE) 20 MG tablet, Take 1 tablet (20 mg total) by mouth daily with breakfast for 5 days., Disp: 5 tablet, Rfl: 0   primidone (MYSOLINE) 50 MG tablet, Take 50 mg by mouth 2 (two) times daily.,  Disp: , Rfl:    promethazine-dextromethorphan (PROMETHAZINE-DM) 6.25-15 MG/5ML syrup, , Disp: , Rfl:    rosuvastatin (CRESTOR) 10 MG tablet, Take 1 tablet (10 mg total) by mouth daily., Disp: 90 tablet, Rfl: 3   Spacer/Aero-Holding Chambers (AEROCHAMBER PLUS WITH MASK) inhaler, , Disp: , Rfl:    traZODone (DESYREL) 100 MG tablet, Take 2 tablets (200 mg total) by mouth at bedtime as needed for sleep., Disp: 180 tablet, Rfl: 3   triamcinolone ointment (KENALOG) 0.5 %, Apply 1 application. topically 2 (two) times daily., Disp: 30 g, Rfl: 0   varenicline (CHANTIX) 0.5 MG tablet, , Disp: , Rfl:    vitamin B-12 (CYANOCOBALAMIN) 1000 MCG tablet, Take 1,000 mcg by mouth daily., Disp: , Rfl:    clonazePAM (KLONOPIN) 0.5 MG tablet, , Disp: , Rfl:    ibuprofen (ADVIL) 600 MG tablet, , Disp: , Rfl:  meloxicam (MOBIC) 15 MG tablet, Take 1 tablet (15 mg total) by mouth daily. (Patient not taking: Reported on 05/16/2022), Disp: 90 tablet, Rfl: 1   meloxicam (MOBIC) 7.5 MG tablet, Take 1 tablet by mouth 2 (two) times daily with a meal. (Patient not taking: Reported on 05/16/2022), Disp: , Rfl:    methocarbamol (ROBAXIN) 500 MG tablet, , Disp: , Rfl:    metoprolol succinate (TOPROL-XL) 50 MG 24 hr tablet, Take 1 tablet (50 mg total) by mouth daily. Please schedule office visit before any future refill. (Patient not taking: Reported on 05/16/2022), Disp: 30 tablet, Rfl: 0   metoprolol tartrate (LOPRESSOR) 25 MG tablet, , Disp: , Rfl:    rosuvastatin (CRESTOR) 5 MG tablet, , Disp: , Rfl:    theophylline (THEO-24) 300 MG 24 hr capsule, Take 1 capsule (300 mg total) by mouth daily. (Patient not taking: Reported on 05/16/2022), Disp: 90 capsule, Rfl: 1   traMADol (ULTRAM) 50 MG tablet, Take 1 tablet by mouth every 4 (four) hours as needed. (Patient not taking: Reported on 05/16/2022), Disp: , Rfl:    traZODone (DESYREL) 50 MG tablet, , Disp: , Rfl:    Subjective:   PATIENT ID: Linda Bradford GENDER: female DOB:  July 03, 1956, MRN: [id number]  Chief Complaint  Patient presents with   pulmonary consult    SOB in the morning and at night, prod cough with clear to greenish mucus and wheezing    HPI  Linda Bradford is a pleasant 66 year old female presenting to clinic for the evaluation of shortness of breath, cough, and wheezing.  She reports a long standing history of COPD and "bronchitis". She started smoking at the age of 58, and has been smoking between a pack and a pack and a half since. She reports multiple episodes of bronchitis throughout her life that she felt improved with antibiotics. Her symptoms somewhat accelerated over the past few years and she's been feeling significantly more short of breath with more exacerbations requiring prednisone.  She is now reporting a cough productive of greenish sputum, associated with a shortness of breath (with minimal exertion) as well as a wheeze. She is continuously wheezy. The cough has become more productive recently with a change in the color of sputum from whitish to green/yellow. She has not had any fevers, chills, night sweats, rashes, weight loss, or chest pains. There are no other signs of infection or systemic inflammation. She had been placed on Trelegy by her primary care provider which she took alongside theophyline. Her theophyline script (written by a previous provider who has since retired) has ran out and she is wondering if this can be renewed.  She lives with her husband and his sister, for whom she provides care. She reports smoking a pack and a half a day and has done so since the age of 49 (around 99 pack year smoking history). 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  Ancillary information including prior medications, full medical/surgical/family/social histories, and PFTs (when available) are listed below and have been reviewed.   Review of Systems  Constitutional:  Negative for chills, fever and  weight loss.  Respiratory:  Positive for cough, sputum production (greenish sputum), shortness of breath and wheezing. Negative for hemoptysis.   Cardiovascular:  Negative for palpitations and leg swelling.  Gastrointestinal:  Negative for heartburn.  Skin:  Negative for rash.  Neurological:  Negative for dizziness.  Psychiatric/Behavioral:  Negative for depression.  Objective:   Vitals:   05/16/22 0956  BP: 132/78  Pulse: 71  Temp: 97.9 F (36.6 C)  TempSrc: Temporal  SpO2: 95%  Weight: 220 lb 12.8 oz (100.2 kg)  Height: '5\' 10"'$  (1.778 m)   95% on RA  BMI Readings from Last 3 Encounters:  05/16/22 31.68 kg/m  04/18/22 30.98 kg/m  03/29/22 31.14 kg/m   Wt Readings from Last 3 Encounters:  05/16/22 220 lb 12.8 oz (100.2 kg)  04/18/22 215 lb 14.4 oz (97.9 kg)  03/29/22 217 lb (98.4 kg)    Physical Exam Constitutional:      General: She is not in acute distress.    Appearance: She is obese. She is not ill-appearing.  HENT:     Head: Normocephalic.     Mouth/Throat:     Mouth: Mucous membranes are moist.  Eyes:     Extraocular Movements: Extraocular movements intact.  Cardiovascular:     Rate and Rhythm: Normal rate and regular rhythm.     Pulses: Normal pulses.     Heart sounds: Normal heart sounds.  Pulmonary:     Effort: No respiratory distress.     Breath sounds: Wheezing and rhonchi present.  Abdominal:     General: There is no distension.     Palpations: Abdomen is soft.  Musculoskeletal:     Cervical back: Normal range of motion.  Neurological:     General: No focal deficit present.     Mental Status: She is alert and oriented to person, place, and time. Mental status is at baseline.       Ancillary Information    Past Medical History:  Diagnosis Date   Anemia    Anxiety    Arthritis    Cancer ([place])    skin cancer, removed   COPD (chronic obstructive pulmonary disease) ([place])    Depression    Diabetes mellitus without complication  ([facility])    Dyspnea    Dysrhythmia    occasional pvc's. not being treated.   Fatigue    GERD (gastroesophageal reflux disease)    Headache    Heart murmur    Hypertension    Hypothyroidism    Pneumonia    Thyroid disease    Tremors of nervous system      Family History  Problem Relation Age of Onset   Lung cancer Mother    Alcohol abuse Sister    Arthritis Sister    Alcohol abuse Brother    Heart disease Brother 9   Heart attack Brother    Pancreatic cancer Maternal Aunt    Diabetes Maternal Aunt    Alcohol abuse Son    Drug abuse Son    Drug abuse Son    Heart disease Father 46   Diabetes Sister    Breast cancer Brother    Alcohol abuse Maternal Uncle    Alcohol abuse Maternal Grandmother    Diabetes Maternal Grandmother      Past Surgical History:  Procedure Laterality Date   APPENDECTOMY     CHOLECYSTECTOMY N/A 07/01/2019   Procedure: LAPAROSCOPIC CHOLECYSTECTOMY;  Surgeon: Olean Ree, MD;  Location: [facility];  Service: General;  Laterality: N/A;   COLECTOMY WITH COLOSTOMY CREATION/HARTMANN PROCEDURE N/A 09/03/2018   Error, this was not done.   COLONOSCOPY WITH PROPOFOL N/A 03/08/2019   Procedure: COLONOSCOPY WITH PROPOFOL;  Surgeon: Virgel Manifold, MD;  Location: [facility];  Service: Endoscopy;  Laterality: N/A;   CYSTOSCOPY WITH STENT PLACEMENT Bilateral 09/03/2018  Procedure: CYSTOSCOPY WITH STENT PLACEMENT-LIGHTED STENTS;  Surgeon: Hollice Espy, MD;  Location: [facility];  Service: Urology;  Laterality: Bilateral;   LAPAROSCOPIC SIGMOID COLECTOMY N/A 09/03/2018   Procedure: LAPAROSCOPIC SIGMOID COLECTOMY;  Surgeon: Olean Ree, MD;  Location: [facility];  Service: General;  Laterality: N/A;   percutaneous drainage tube  07/2018   2nd tube placed. not healing in colon, causing a fistula   TONSILLECTOMY     TOTAL KNEE ARTHROPLASTY Left 05/10/2021   Procedure: TOTAL KNEE ARTHROPLASTY;  Surgeon: Lovell Sheehan, MD;  Location: [facility];  Service:  Orthopedics;  Laterality: Left;   TOTAL KNEE ARTHROPLASTY Right 01/10/2022   Procedure: TOTAL KNEE ARTHROPLASTY;  Surgeon: Lovell Sheehan, MD;  Location: [facility];  Service: Orthopedics;  Laterality: Right;   TUBAL LIGATION      Social History   Socioeconomic History   Marital status: Married    Spouse name: Clair Gulling   Number of children: 2   Years of education: Not on file   Highest education level: Not on file  Occupational History   Occupation: Careers information officer    Comment: currently employed  Tobacco Use   Smoking status: Every Day    Packs/day: 2.00    Years: 45.00    Total pack years: 90.00    Types: Cigarettes    Start date: 03/02/1980   Smokeless tobacco: Never   Tobacco comments:    1.5PPD 05/16/2022  Vaping Use   Vaping Use: Former   Devices: only for a month. did not like  Substance and Sexual Activity   Alcohol use: No    Alcohol/week: 0.0 standard drinks of alcohol   Drug use: No   Sexual activity: Not Currently  Other Topics Concern   Not on file  Social History Narrative   Live with hubby at home, sister susie with down syndrome   Social Determinants of Health   Financial Resource Strain: Low Risk  (04/18/2022)   Overall Financial Resource Strain (CARDIA)    Difficulty of Paying Living Expenses: Not hard at all  Food Insecurity: No Food Insecurity (04/18/2022)   Hunger Vital Sign    Worried About Running Out of Food in the Last Year: Never true    Ran Out of Food in the Last Year: Never true  Transportation Needs: No Transportation Needs (04/18/2022)   PRAPARE - Hydrologist (Medical): No    Lack of Transportation (Non-Medical): No  Physical Activity: Insufficiently Active (04/18/2022)   Exercise Vital Sign    Days of Exercise per Week: 3 days    Minutes of Exercise per Session: 30 min  Stress: No Stress Concern Present (04/18/2022)   [place]    Feeling of Stress  : Only a little  Social Connections: Socially Isolated (04/18/2022)   Social Connection and Isolation Panel [NHANES]    Frequency of Communication with Friends and Family: Once a week    Frequency of Social Gatherings with Friends and Family: Never    Attends Religious Services: Never    Marine scientist or Organizations: No    Attends Archivist Meetings: Never    Marital Status: Married  Human resources officer Violence: Not At Risk (04/18/2022)   Humiliation, Afraid, Rape, and Kick questionnaire    Fear of Current or Ex-Partner: No    Emotionally Abused: No    Physically Abused: No    Sexually Abused: No     Allergies  Allergen Reactions   Dexilant [Dexlansoprazole] Nausea Only and Other (See Comments)    Dizziness     CBC    Component Value Date/Time   WBC 8.0 12/24/2021 1524   RBC 4.47 12/24/2021 1524   HGB 13.8 12/24/2021 1524   HGB 14.8 09/27/2021 0950   HCT 42.5 12/24/2021 1524   HCT 44.2 09/27/2021 0950   PLT 247 12/24/2021 1524   PLT 300 09/27/2021 0950   MCV 95.1 12/24/2021 1524   MCV 93 09/27/2021 0950   MCH 30.9 12/24/2021 1524   MCHC 32.5 12/24/2021 1524   RDW 12.7 12/24/2021 1524   RDW 14.3 09/27/2021 0950   LYMPHSABS 1.9 04/06/2021 0846   MONOABS 0.7 07/06/2019 0320   EOSABS 0.2 04/06/2021 0846   BASOSABS 0.1 04/06/2021 0846    Pulmonary Functions Testing Results:     No data to display          Outpatient Medications Prior to Visit  Medication Sig Dispense Refill   acetaminophen (TYLENOL) 500 MG tablet Take 1,000 mg by mouth every 6 (six) hours as needed for mild pain or moderate pain.      albuterol (VENTOLIN HFA) 108 (90 Base) MCG/ACT inhaler INHALE 2 PUFFS INTO THE LUNGS EVERY 4 HOURS AS NEEDED FOR WHEEZING OR SHORTNESS OF BREATH 8.5 g 3   aspirin 81 MG chewable tablet Chew 1 tablet (81 mg total) by mouth 2 (two) times daily. 60 tablet 0   benzonatate (TESSALON) 200 MG capsule TAKE 1 CAPSULE BY MOUTH 3 TIMES DAILY ASNEEDED 90  capsule 0   buPROPion (WELLBUTRIN XL) 150 MG 24 hr tablet TAKE 1 TABLET BY MOUTH ONCE DAILY. *MAKEAPPOINTMENT FOR FURTHER FILLS* 30 tablet 0   Cholecalciferol (VITAMIN D) 50 MCG (2000 UT) tablet Take 2,000 Units by mouth daily.     diclofenac (VOLTAREN) 50 MG EC tablet      diclofenac Sodium (VOLTAREN) 1 % GEL      DULoxetine (CYMBALTA) 60 MG capsule Take 1 capsule (60 mg total) by mouth 2 (two) times daily. Please schedule office visit before any future refill. 60 capsule 0   ezetimibe (ZETIA) 10 MG tablet      fluconazole (DIFLUCAN) 150 MG tablet Take 1 tablet by mouth, if symptoms still remain after 3 days- take additional tablet by mouth. 2 tablet 0   fluticasone (FLONASE) 50 MCG/ACT nasal spray USE 2 SPRAYS IN EACH NOSTRIL ONCE DAILY 48 g 1   Fluticasone-Umeclidin-Vilant (TRELEGY ELLIPTA) 200-62.5-25 MCG/ACT AEPB Inhale 1 puff into the lungs 2 (two) times daily. 60 each 11   gabapentin (NEURONTIN) 300 MG capsule TAKE 3 CAPSULES BY MOUTH ONCE EVERY MORNING AND 5 CAPSULES AT BEDTIME 240 capsule 5   glucose blood (CONTOUR NEXT TEST) test strip To check blood sugar once daily 100 each 12   hydrOXYzine (ATARAX) 10 MG tablet Take 1 tablet (10 mg total) by mouth 3 (three) times daily as needed. 30 tablet 0   ipratropium-albuterol (DUONEB) 0.5-2.5 (3) MG/3ML SOLN Take 3 mLs by nebulization every 6 (six) hours as needed. 360 mL 11   levothyroxine (SYNTHROID) 75 MCG tablet Take 1 tablet (75 mcg total) by mouth daily before breakfast. 90 tablet 3   loperamide (IMODIUM A-D) 2 MG tablet Take 2-4 mg by mouth 4 (four) times daily as needed for diarrhea or loose stools.     metaxalone (SKELAXIN) 800 MG tablet TAKE 1 TABLET BY MOUTH 3 TIMES DAILY FORMUSCLE SPASMS 270 tablet 0   metFORMIN (GLUCOPHAGE-XR) 500 MG 24  hr tablet TAKE 2 TABLETS BY MOUTH ONCE A DAY WITH SUPPER. Please schedule office visit before any future refill. 60 tablet 0   nitrofurantoin, macrocrystal-monohydrate, (MACROBID) 100 MG capsule       nystatin (MYCOSTATIN) 100000 UNIT/ML suspension Take 5 mLs (500,000 Units total) by mouth 4 (four) times daily. 473 mL 0   nystatin (MYCOSTATIN/NYSTOP) powder Apply 1 application topically 3 (three) times daily. 60 g 0   oxybutynin (DITROPAN-XL) 5 MG 24 hr tablet Take 1 tablet (5 mg total) by mouth at bedtime. 90 tablet 5   primidone (MYSOLINE) 50 MG tablet Take 50 mg by mouth 2 (two) times daily.     promethazine-dextromethorphan (PROMETHAZINE-DM) 6.25-15 MG/5ML syrup      rosuvastatin (CRESTOR) 10 MG tablet Take 1 tablet (10 mg total) by mouth daily. 90 tablet 3   Spacer/Aero-Holding Chambers (AEROCHAMBER PLUS WITH MASK) inhaler      traZODone (DESYREL) 100 MG tablet Take 2 tablets (200 mg total) by mouth at bedtime as needed for sleep. 180 tablet 3   triamcinolone ointment (KENALOG) 0.5 % Apply 1 application. topically 2 (two) times daily. 30 g 0   varenicline (CHANTIX) 0.5 MG tablet      vitamin B-12 (CYANOCOBALAMIN) 1000 MCG tablet Take 1,000 mcg by mouth daily.     docusate sodium (COLACE) 100 MG capsule Take 1 capsule (100 mg total) by mouth 2 (two) times daily. 30 capsule 0   clonazePAM (KLONOPIN) 0.5 MG tablet  (Patient not taking: Reported on 05/16/2022)     ibuprofen (ADVIL) 600 MG tablet  (Patient not taking: Reported on 05/16/2022)     meloxicam (MOBIC) 15 MG tablet Take 1 tablet (15 mg total) by mouth daily. (Patient not taking: Reported on 05/16/2022) 90 tablet 1   meloxicam (MOBIC) 7.5 MG tablet Take 1 tablet by mouth 2 (two) times daily with a meal. (Patient not taking: Reported on 05/16/2022)     methocarbamol (ROBAXIN) 500 MG tablet  (Patient not taking: Reported on 05/16/2022)     metoprolol succinate (TOPROL-XL) 50 MG 24 hr tablet Take 1 tablet (50 mg total) by mouth daily. Please schedule office visit before any future refill. (Patient not taking: Reported on 05/16/2022) 30 tablet 0   metoprolol tartrate (LOPRESSOR) 25 MG tablet  (Patient not taking: Reported on 05/16/2022)      rosuvastatin (CRESTOR) 5 MG tablet  (Patient not taking: Reported on 05/16/2022)     theophylline (THEO-24) 300 MG 24 hr capsule Take 1 capsule (300 mg total) by mouth daily. (Patient not taking: Reported on 05/16/2022) 90 capsule 1   traMADol (ULTRAM) 50 MG tablet Take 1 tablet by mouth every 4 (four) hours as needed. (Patient not taking: Reported on 05/16/2022)     traZODone (DESYREL) 50 MG tablet  (Patient not taking: Reported on 05/16/2022)     No facility-administered medications prior to visit.

## 2022-05-16 NOTE — Patient Instructions (Signed)
Today, we discussed your symptoms. I will prescribe you a course of antibiotics and prednisone to help with your symptoms. I will order a chest xray as well as a sputum culture to be done today. I have also ordered a breathing test to be done prior to seeing me in a month.   The [facility] Quitline: Call 1-800-QUIT-NOW 778-558-5445). The [place] Quitline is a free service for [employer]. Trained counselors are available from 8 am until 3 am, 365 days per year. Services are available in both [place] and [place].   Web Resources Free online support programs can help you track your progress and share experiences with others who are quitting. These are examples: www.becomeanex.org www.trytostop.org  www.smokefree.gov  www.[employer].com.br.aspx  [facility]: offers comprehensive in-person tobacco treatment counseling at [facility] building ([address], [place] [address]).  Open to everyone. Virtual appointments available. Free parking. Call [phone] to schedule an appointment or [phone] for general information.    Tobacco Cessation Medications  Nicotine Replacement Therapy (NRT)  Nicotine is the addictive part of tobacco smoke, but not the most dangerous part. There are 7000 other toxins in cigarettes, including carbon monoxide, that cause disease. People do not generally become addicted to medication. Common problems: People don't use enough medication or stop too early. Medications are safe and effective. Overdose is very uncommon. Use medications as long as needed (3 months minimum). Some combinations work better than single medications. Long acting medications like the NRT patch and bupropion provide continuous treatment for withdrawal symptoms.  PLUS  Short acting medications like the NRT gum, lozenge, inhaler, and nasal spray help people to cope with breakthrough cravings.  ? Nicotine Patch  Place  patch on hairless skin on upper body, including arms and back. Each day: discard old patch, shower, apply new patch to a different site. Apply hydrocortisone cream to mildly red/irritated areas. Call provider if rash develops. If patch causes sleep disturbance, remove patch at bedtime and replace each morning after shower. Side effects may include: skin irritation, headache, insomnia, abnormal/vivid dreams.  ? Nicotine Gum  Chew gum slowly, park in cheek when peppery taste or tingling sensation begins (about 15-30 chews). When taste or tingling goes away, begin chewing again. Use until nicotine is gone (taste or tingle does not return, usually 30 minutes). Park in different areas of mouth. Nicotine is absorbed through the lining of the mouth. Use enough to control cravings, up to 24 pieces per day (if used alone). Avoid eating or drinking for 15 minutes before using and during use. Side effects may include: mouth/jaw soreness, hiccups, indigestion, hypersalivation.  If gum is not chewed correctly, additional side effects may include lightheadedness, nausea/vomiting, throat and mouth irritation.  ? Nicotine Lozenge  Allow to dissolve slowly in mouth (20-30 minutes). Do not chew or swallow. Nicotine release may cause a warm tingling sensation. Occasionally rotate to different areas of the mouth. Use enough to control cravings, up to 20 lozenges per day (if used alone). Avoid eating or drinking for 15 minutes before using and during use. Side effects may include: nausea, hiccups, cough, heartburn, headache, gas, insomnia.  ? Nicotine Nasal Spray Use 1 spray in each nostril (1 dose) and tilt head back for 1 minute. Do not sniff, swallow, or inhale through nose.  Use at least 8 doses (1 spray in each nostril) , up to 40 doses per day (if used alone). To reduce nasal irritation, spray on cotton swab and insert into  nose. Side effects may include: nasal and/or throat irritation (hot, peppery, or  burning sensation), nasal irritation, tearing, sneezing, cough, headache.  ? Nicotine Oral Inhaler (puffer) Inhale into the back of the throat or puff in short breaths. Do not inhale into the lungs.  Puff continuously for 20 minutes (about 80 puffs) until cartridge is empty. Change cartridge when it loses the "burning in throat" sensation (feels like air only). Open cartridges can be saved and used again within 24 hours. Use at least 6 and up to 16 cartridges per day (if used alone).  Avoid eating or drinking for 15 minutes before using and during use. Side effects may include: mouth and/or throat irritation, unpleasant taste, cough, nasal irritation, indigestion, hiccups, headache.  ? Chantix (varenicline) Days 1-3: Take one 0.5 mg white pill each morning for 3 days, one week before quit date. Days 4-7: Increase to one 0.5 mg white pill twice a day in morning and evening for 4 days.  On Day 8 (target quit date), increase to one 1 mg blue pill twice a day. Maintain this dose for a minimum of 3 months. Take with food and a full glass of water to reduce nausea. Be sure that the two doses are at least 8 hours apart, but try to take second dose early in the evening (i.e. 6 pm) to avoid sleep problems. Common side effects include: nausea, insomnia, headache, abnormal/vivid dreams. Tell your doctor if you have any history of psychiatric illness prior to starting Chantix.  STOP taking CHANTIX and contact a healthcare provider immediately if you experience agitation, hostility, depressed mood, changes in thoughts or behavior that are not typical for you, thinking about or attempting suicide, allergic or skin reactions including swelling, rash, redness, or peeling of the skin.  For patients who have heart disease: Smoking is a major risk factor for cardiovascular disease, and Chantix can help you quit smoking. Chantix may be associated with a small, increased risk of certain heart events in patients who  have heart disease. If you have any new or worsening symptoms of heart disease while taking Chantix, such as shortness of breath or trouble breathing, new or worsening chest pain, or new or worsening pain in your legs when walking, call your doctor or get emergency medical help immediately.  ? Wellbutrin / Zyban (bupropion) Take one 150 mg pill each morning for 3 days, one week before target quit date. On Day 4, increase to one 150 mg pill twice a day, morning and evening.  Maintain this dose for a minimum of 3 months. Be sure that the two doses are at least 8 hours apart, but try to take second dose early in the evening (i.e. 6 pm) to avoid sleep problems. Avoid or minimize use of alcohol when taking this medication. Common side effects include: dry mouth, headache, insomnia, nausea, weight loss.  Risk of seizure is 08/998. STOP taking BUPROPION and contact a healthcare provider immediately if you experience agitation, hostility, depressed mood, changes in thoughts or behavior that are not typical for you, thinking about or attempting suicide, allergic or skin reactions including swelling, rash, redness, or peeling of the skin.

## 2022-05-17 ENCOUNTER — Telehealth: Payer: Self-pay

## 2022-05-17 ENCOUNTER — Ambulatory Visit
Admission: RE | Admit: 2022-05-17 | Discharge: 2022-05-17 | Disposition: A | Discharge status: Home / Self Care | Payer: Medicare PPO | Source: Ambulatory Visit | Attending: Physician Assistant | Admitting: Physician Assistant

## 2022-05-17 ENCOUNTER — Ambulatory Visit
Admission: RE | Admit: 2022-05-17 | Discharge: 2022-05-17 | Disposition: A | Discharge status: Home / Self Care | Payer: Medicare PPO | Source: Ambulatory Visit | Attending: Family Medicine | Admitting: Family Medicine

## 2022-05-17 ENCOUNTER — Ambulatory Visit: Payer: Medicare PPO

## 2022-05-17 ENCOUNTER — Other Ambulatory Visit: Payer: Medicare PPO

## 2022-05-17 DIAGNOSIS — Z780 Asymptomatic menopausal state: Secondary | ICD-10-CM | POA: Insufficient documentation

## 2022-05-17 DIAGNOSIS — Z1231 Encounter for screening mammogram for malignant neoplasm of breast: Secondary | ICD-10-CM | POA: Insufficient documentation

## 2022-05-17 NOTE — Telephone Encounter (Signed)
Patient is calling because she states pulmonology put her on Prednisone and Amoxicillin yesterday because she has a bad infection. She state she Is coughing and wants to know if she should reschedule colonoscopy that is schedule for 05/18/22. Informed patient yes lets reschedule the procedure. Reschedule to 06/08/2022. Called Trish and informed trish of the change in endo. Sent new instructions to [employer] and mailed them

## 2022-05-18 NOTE — Progress Notes (Signed)
Normal bone strength noted. Can use Vit D 800 IU and Calcium 1200 mg to assist regular exercise for bone health. Can repeat DEXA in 5 years if desired.  [name], [address] [place], [place] [address] [phone] (phone) [phone] (fax) [place]

## 2022-05-24 ENCOUNTER — Other Ambulatory Visit: Payer: Self-pay | Admitting: Family Medicine

## 2022-05-24 ENCOUNTER — Ambulatory Visit: Payer: Self-pay | Admitting: *Deleted

## 2022-05-24 ENCOUNTER — Telehealth: Payer: Self-pay | Admitting: Student in an Organized Health Care Education/Training Program

## 2022-05-24 DIAGNOSIS — B3731 Acute candidiasis of vulva and vagina: Secondary | ICD-10-CM

## 2022-05-24 DIAGNOSIS — F331 Major depressive disorder, recurrent, moderate: Secondary | ICD-10-CM

## 2022-05-24 DIAGNOSIS — F411 Generalized anxiety disorder: Secondary | ICD-10-CM

## 2022-05-24 MED ORDER — FLUCONAZOLE 150 MG PO TABS: ORAL_TABLET | ORAL | 0 refills | Status: DC

## 2022-05-24 NOTE — Telephone Encounter (Signed)
Patient is aware of below message/recommendations and voiced her understanding.  Nothing further needed.  

## 2022-05-24 NOTE — Telephone Encounter (Signed)
Let message letting patient know medication was sent to her pharmacy.

## 2022-05-24 NOTE — Telephone Encounter (Signed)
Lm for patient.  

## 2022-05-24 NOTE — Telephone Encounter (Signed)
Called and spoke to patient.  She reports of mouth and vaginal yeast infection from Augmentin that was prescribed 05/16/2022. She has tried OTC monistat without relief in sx. She completed course of Augmentin yesterday. She stated that her respiratory sx improved with abx but did not completely subside. C/o chest congestion, prod cough with clear mucus, wheezing and SOB at night. She is using albuterol HFA Q4H and trelegy once daily. She has not used duoneb.  She does not wear supplemental oxygen. She does not monitor oxygen levels.  Currently smoking 1.5PPD.  Dr. Genia Harold, please advise. Thanks

## 2022-05-24 NOTE — Telephone Encounter (Signed)
Summary: Yeast infection because of abx.   Pt called reporting that she has been prescribed an antibiotic from her pulmonologist, this has now given her a yeast infection. She says that her pulmonologist told her to contact her PCP.   [id number] [address], [address]  [place] [address]  Phone: [phone] Fax: [phone]       Called patient to review sx after taking antibiotics and requesting for medication . No answer, LVMTCB [phone].

## 2022-05-24 NOTE — Telephone Encounter (Signed)
Two tablets for Diflucan '150mg'$  once and repeat after 72 hours    Eulis Foster, MD  [facility]  [phone]

## 2022-05-24 NOTE — Telephone Encounter (Signed)
Reason for Disposition  [1] Symptoms of a yeast infection (i.e., itchy, white discharge, not bad smelling) AND [2] not improved > 3 days following Care Advice  Answer Assessment - Initial Assessment Questions 1. SYMPTOM: "What's the main symptom you're concerned about?" (e.g., pain, itching, dryness)     Vaginal discharge and burning and irritated.   I'm on an antibiotic from my pulmonologist.    I'm on amoxicillin and I have a terrible yeast infection.   I get this every time I'm on an antibiotic.   She usually gives me diflucan along with the antibiotic but this came from another dr. And he told me to check with my PCP. 2. LOCATION: "Where is the  burning located?" (e.g., inside/outside, left/right)     Outside of my vaginal area 3. ONSET: "When did the  irritation and burning  start?"     When I started the Amoxicillin 4. PAIN: "Is there any pain?" If Yes, ask: "How bad is it?" (Scale: 1-10; mild, moderate, severe)   -  MILD (1-3): Doesn't interfere with normal activities.    -  MODERATE (4-7): Interferes with normal activities (e.g., work or school) or awakens from sleep.     -  SEVERE (8-10): Excruciating pain, unable to do any normal activities.     Yes burning and irritation 5. ITCHING: "Is there any itching?" If Yes, ask: "How bad is it?" (Scale: 1-10; mild, moderate, severe)     Yes 6. CAUSE: "What do you think is causing the discharge?" "Have you had the same problem before? What happened then?"     I have a discharge 7. OTHER SYMPTOMS: "Do you have any other symptoms?" (e.g., fever, itching, vaginal bleeding, pain with urination, injury to genital area, vaginal foreign body)     Irritation to the genital area 8. PREGNANCY: "Is there any chance you are pregnant?" "When was your last menstrual period?"     Not asked due to age  Protocols used: Vaginal Symptoms-A-AH  Chief Complaint: vaginal yeast infection from taking amoxicillin her pulmonary dr. Hanley Seamen her. Symptoms: vaginal  discharge, burning and a lot of genital irritation Frequency: since starting the amoxicillin Pertinent Negatives: Patient denies N/A Disposition: '[]'$ ED /'[]'$ Urgent Care (no appt availability in office) / '[]'$ Appointment(In office/virtual)/ '[]'$  [place] [facility]/ '[]'$ Home Care/ '[]'$ Refused Recommended Disposition /'[]'$ [place] [facility]/ '[x]'$  Follow-up with PCP Additional Notes: She is requesting Diflucan be called into the  [facility] on [address].   She gets these yeast infections every time she's on an antibiotic.

## 2022-06-06 ENCOUNTER — Telehealth: Payer: Self-pay

## 2022-06-06 ENCOUNTER — Telehealth: Payer: Self-pay | Admitting: Student in an Organized Health Care Education/Training Program

## 2022-06-06 NOTE — Telephone Encounter (Signed)
I have spoke with Linda Bradford and I have CXL her PFT appt on 06/09/22. She wants to keep appt with Dr. Genia Harold on 06/14/22 as follow up due to pneumonia

## 2022-06-06 NOTE — Telephone Encounter (Signed)
Pt called and stated that she was diagnosed with pna Friday 11/3. Due to this, she is wanting to reschedule her PFT. Routing to [place]. Please advise.

## 2022-06-06 NOTE — Telephone Encounter (Signed)
Patient is needed to reschedule her colonoscopy on 06/08/2022 because she has pneumonia. Patient states she wants to move it to January. She states insurance is not changing. Change to 08/17/2022 and sent out new instructions by mychart and mailed

## 2022-06-06 NOTE — Telephone Encounter (Signed)
Noted.  Will route to Dr. Genia Harold as an Juluis Rainier

## 2022-06-09 ENCOUNTER — Ambulatory Visit: Payer: Medicare PPO

## 2022-06-14 ENCOUNTER — Ambulatory Visit: Payer: Medicare PPO | Admitting: Student in an Organized Health Care Education/Training Program

## 2022-06-14 ENCOUNTER — Encounter: Payer: Self-pay | Admitting: Student in an Organized Health Care Education/Training Program

## 2022-06-14 VITALS — BP 132/74 | HR 77 | Temp 98.6°F | Ht 68.0 in | Wt 225.0 lb

## 2022-06-14 DIAGNOSIS — R0602 Shortness of breath: Secondary | ICD-10-CM

## 2022-06-14 DIAGNOSIS — F17210 Nicotine dependence, cigarettes, uncomplicated: Secondary | ICD-10-CM

## 2022-06-14 DIAGNOSIS — J418 Mixed simple and mucopurulent chronic bronchitis: Secondary | ICD-10-CM

## 2022-06-14 MED ORDER — NICOTINE 14 MG/24HR TD PT24: 14.0000 mg | MEDICATED_PATCH | TRANSDERMAL | 0 refills | Status: DC

## 2022-06-14 MED ORDER — NICOTINE 7 MG/24HR TD PT24: 7.0000 mg | MEDICATED_PATCH | TRANSDERMAL | 0 refills | Status: DC

## 2022-06-14 MED ORDER — NICOTINE POLACRILEX 2 MG MT LOZG: 2.0000 mg | LOZENGE | OROMUCOSAL | 3 refills | Status: AC | PRN

## 2022-06-14 MED ORDER — NICOTINE 21 MG/24HR TD PT24: 21.0000 mg | MEDICATED_PATCH | TRANSDERMAL | 0 refills | Status: AC

## 2022-06-14 NOTE — Progress Notes (Signed)
Synopsis: Follow up for shortness of breath, recent respiratory illness.  Assessment & Plan:   #COPD #Shortness of Breath #Moderate smoker (20 or less per day)  Linda Bradford has a history of symptoms consistent with COPD given the wheezing, cough, and shortness of breath. She has had CT scans of the chest for lung cancer screening that are notable for paraseptal and centri-lobular emphysema. She does describe symptoms of childhood bronchitis which could suggest an Asthma/COPD overlap. She has not had PFT's and was sick last week when her PFT was scheduled. She will re-schedule her PFT prior to our next visit   For workup, she'll need a pulmonary function test. I will continue her Trelegy pending the PFT's. Given the narrow therapeutic window of theophyline and its multiple side effects, I will err on the side of keeping it discontinued for the time being. Today, I counseled her on the importance of smoking cessation and she is interested in attempting to quit. I will initiate nicotine replacement therapy with patches and lozenges.  - nicotine (NICODERM CQ - DOSED IN MG/24 HR) 7 mg/24hr patch; Place 1 patch (7 mg total) onto the skin daily for 14 days.  Dispense: 14 patch; Refill: 0 - nicotine (NICODERM CQ - DOSED IN MG/24 HOURS) 21 mg/24hr patch; Place 1 patch (21 mg total) onto the skin daily.  Dispense: 42 patch; Refill: 0 - nicotine (NICODERM CQ - DOSED IN MG/24 HOURS) 14 mg/24hr patch; Place 1 patch (14 mg total) onto the skin daily for 14 days.  Dispense: 14 patch; Refill: 0 - nicotine polacrilex (NICOTINE MINI) 2 MG lozenge; Take 1 lozenge (2 mg total) by mouth every 2 (two) hours as needed for smoking cessation.  Dispense: 72 lozenge; Refill: 3   Return in about 3 months (around 09/14/2022).  I spent 38 minutes caring for this patient today, including preparing to see the patient, obtaining and/or reviewing separately obtained history, performing a medically appropriate examination and/or  evaluation, counseling and educating the patient/family/caregiver, ordering medications, tests, or procedures, documenting clinical information in the electronic health record, and independently interpreting results (not separately reported/billed) and communicating results to the patient/family/caregiver  Armando Reichert, MD [facility] 06/14/2022 3:08 PM    End of visit medications:  Meds ordered this encounter  Medications   nicotine (NICODERM CQ - DOSED IN MG/24 HR) 7 mg/24hr patch    Sig: Place 1 patch (7 mg total) onto the skin daily for 14 days.    Dispense:  14 patch    Refill:  0   nicotine (NICODERM CQ - DOSED IN MG/24 HOURS) 21 mg/24hr patch    Sig: Place 1 patch (21 mg total) onto the skin daily.    Dispense:  42 patch    Refill:  0   nicotine (NICODERM CQ - DOSED IN MG/24 HOURS) 14 mg/24hr patch    Sig: Place 1 patch (14 mg total) onto the skin daily for 14 days.    Dispense:  14 patch    Refill:  0   nicotine polacrilex (NICOTINE MINI) 2 MG lozenge    Sig: Take 1 lozenge (2 mg total) by mouth every 2 (two) hours as needed for smoking cessation.    Dispense:  72 lozenge    Refill:  3     Current Outpatient Medications:    acetaminophen (TYLENOL) 500 MG tablet, Take 1,000 mg by mouth every 6 (six) hours as needed for mild pain or moderate pain. , Disp: , Rfl:  albuterol (VENTOLIN HFA) 108 (90 Base) MCG/ACT inhaler, INHALE 2 PUFFS INTO THE LUNGS EVERY 4 HOURS AS NEEDED FOR WHEEZING OR SHORTNESS OF BREATH, Disp: 8.5 g, Rfl: 3   aspirin 81 MG chewable tablet, Chew 1 tablet (81 mg total) by mouth 2 (two) times daily., Disp: 60 tablet, Rfl: 0   benzonatate (TESSALON) 200 MG capsule, TAKE 1 CAPSULE BY MOUTH 3 TIMES DAILY ASNEEDED, Disp: 90 capsule, Rfl: 0   buPROPion (WELLBUTRIN XL) 150 MG 24 hr tablet, TAKE 1 TABLET BY MOUTH ONCE DAILY. *MAKEAPPOINTMENT FOR FURTHER FILLS*, Disp: 30 tablet, Rfl: 0   Cholecalciferol (VITAMIN D) 50 MCG (2000 UT) tablet,  Take 2,000 Units by mouth daily., Disp: , Rfl:    clonazePAM (KLONOPIN) 0.5 MG tablet, , Disp: , Rfl:    diclofenac (VOLTAREN) 50 MG EC tablet, , Disp: , Rfl:    diclofenac Sodium (VOLTAREN) 1 % GEL, , Disp: , Rfl:    DULoxetine (CYMBALTA) 60 MG capsule, Take 1 capsule (60 mg total) by mouth 2 (two) times daily. Please schedule office visit before any future refill., Disp: 60 capsule, Rfl: 0   ezetimibe (ZETIA) 10 MG tablet, , Disp: , Rfl:    fluconazole (DIFLUCAN) 150 MG tablet, Take 1 tablet by mouth, if symptoms still remain after 3 days- take additional tablet by mouth., Disp: 2 tablet, Rfl: 0   fluticasone (FLONASE) 50 MCG/ACT nasal spray, USE 2 SPRAYS IN EACH NOSTRIL ONCE DAILY, Disp: 48 g, Rfl: 1   Fluticasone-Umeclidin-Vilant (TRELEGY ELLIPTA) 200-62.5-25 MCG/ACT AEPB, Inhale 1 puff into the lungs 2 (two) times daily., Disp: 60 each, Rfl: 11   gabapentin (NEURONTIN) 300 MG capsule, TAKE 3 CAPSULES BY MOUTH ONCE EVERY MORNING AND 5 CAPSULES AT BEDTIME, Disp: 240 capsule, Rfl: 5   glucose blood (CONTOUR NEXT TEST) test strip, To check blood sugar once daily, Disp: 100 each, Rfl: 12   hydrOXYzine (ATARAX) 10 MG tablet, Take 1 tablet (10 mg total) by mouth 3 (three) times daily as needed., Disp: 30 tablet, Rfl: 0   ibuprofen (ADVIL) 600 MG tablet, , Disp: , Rfl:    ipratropium-albuterol (DUONEB) 0.5-2.5 (3) MG/3ML SOLN, Take 3 mLs by nebulization every 6 (six) hours as needed., Disp: 360 mL, Rfl: 11   levothyroxine (SYNTHROID) 75 MCG tablet, Take 1 tablet (75 mcg total) by mouth daily before breakfast., Disp: 90 tablet, Rfl: 3   loperamide (IMODIUM A-D) 2 MG tablet, Take 2-4 mg by mouth 4 (four) times daily as needed for diarrhea or loose stools., Disp: , Rfl:    meloxicam (MOBIC) 15 MG tablet, Take 1 tablet (15 mg total) by mouth daily., Disp: 90 tablet, Rfl: 1   meloxicam (MOBIC) 7.5 MG tablet, Take 1 tablet by mouth 2 (two) times daily with a meal., Disp: , Rfl:    metaxalone (SKELAXIN) 800  MG tablet, TAKE 1 TABLET BY MOUTH 3 TIMES DAILY FORMUSCLE SPASMS, Disp: 270 tablet, Rfl: 0   metFORMIN (GLUCOPHAGE-XR) 500 MG 24 hr tablet, TAKE 2 TABLETS BY MOUTH ONCE A DAY WITH SUPPER. Please schedule office visit before any future refill., Disp: 60 tablet, Rfl: 0   methocarbamol (ROBAXIN) 500 MG tablet, , Disp: , Rfl:    metoprolol succinate (TOPROL-XL) 50 MG 24 hr tablet, Take 1 tablet (50 mg total) by mouth daily. Please schedule office visit before any future refill., Disp: 30 tablet, Rfl: 0   metoprolol tartrate (LOPRESSOR) 25 MG tablet, , Disp: , Rfl:    nicotine (NICODERM CQ - DOSED IN MG/24  HOURS) 14 mg/24hr patch, Place 1 patch (14 mg total) onto the skin daily for 14 days., Disp: 14 patch, Rfl: 0   nicotine (NICODERM CQ - DOSED IN MG/24 HOURS) 21 mg/24hr patch, Place 1 patch (21 mg total) onto the skin daily., Disp: 42 patch, Rfl: 0   nicotine (NICODERM CQ - DOSED IN MG/24 HR) 7 mg/24hr patch, Place 1 patch (7 mg total) onto the skin daily for 14 days., Disp: 14 patch, Rfl: 0   nicotine polacrilex (NICOTINE MINI) 2 MG lozenge, Take 1 lozenge (2 mg total) by mouth every 2 (two) hours as needed for smoking cessation., Disp: 72 lozenge, Rfl: 3   nitrofurantoin, macrocrystal-monohydrate, (MACROBID) 100 MG capsule, , Disp: , Rfl:    nystatin (MYCOSTATIN) 100000 UNIT/ML suspension, Take 5 mLs (500,000 Units total) by mouth 4 (four) times daily., Disp: 473 mL, Rfl: 0   nystatin (MYCOSTATIN/NYSTOP) powder, Apply 1 application topically 3 (three) times daily., Disp: 60 g, Rfl: 0   oxybutynin (DITROPAN-XL) 5 MG 24 hr tablet, Take 1 tablet (5 mg total) by mouth at bedtime., Disp: 90 tablet, Rfl: 5   primidone (MYSOLINE) 50 MG tablet, Take 50 mg by mouth 2 (two) times daily., Disp: , Rfl:    promethazine-dextromethorphan (PROMETHAZINE-DM) 6.25-15 MG/5ML syrup, , Disp: , Rfl:    rosuvastatin (CRESTOR) 10 MG tablet, Take 1 tablet (10 mg total) by mouth daily., Disp: 90 tablet, Rfl: 3   rosuvastatin  (CRESTOR) 5 MG tablet, , Disp: , Rfl:    Spacer/Aero-Holding Chambers (AEROCHAMBER PLUS WITH MASK) inhaler, , Disp: , Rfl:    theophylline (THEO-24) 300 MG 24 hr capsule, Take 1 capsule (300 mg total) by mouth daily., Disp: 90 capsule, Rfl: 1   traMADol (ULTRAM) 50 MG tablet, Take 1 tablet by mouth every 4 (four) hours as needed., Disp: , Rfl:    traZODone (DESYREL) 100 MG tablet, Take 2 tablets (200 mg total) by mouth at bedtime as needed for sleep., Disp: 180 tablet, Rfl: 3   traZODone (DESYREL) 50 MG tablet, , Disp: , Rfl:    triamcinolone ointment (KENALOG) 0.5 %, Apply 1 application. topically 2 (two) times daily., Disp: 30 g, Rfl: 0   varenicline (CHANTIX) 0.5 MG tablet, , Disp: , Rfl:    vitamin B-12 (CYANOCOBALAMIN) 1000 MCG tablet, Take 1,000 mcg by mouth daily., Disp: , Rfl:    Subjective:   PATIENT ID: Linda Bradford GENDER: female DOB: 04-23-1956, MRN: [id number]  Chief Complaint  Patient presents with   Follow-up    Recently dx with PNA.     HPI  Ms. Kalish is a pleasant 66 year old female presenting to clinic for follow up on shortness of breath and wheeze.  She reports a long standing history of COPD and "bronchitis". She started smoking at the age of 20, and has been smoking between a pack and a pack and a half since. She reports multiple episodes of bronchitis throughout her life that she felt improved with antibiotics. Her symptoms somewhat accelerated over the past few years and she's been feeling significantly more short of breath with more exacerbations requiring prednisone.   During her initial visit on 05/16/2022 she reported a cough that was productive of sputum and a wheeze. We prescribed her a course of antibiotics and prednisone, with significant improvement. She tells me that she went to [place] to visit her sister where she came down with an URTI and felt lousy. She was seen at [facility] urgent care a couple of weeks ago  and was prescribed a course of antibiotics  and prednisone with improvement. She continues to recover from said bout.  She has not had any fevers, chills, night sweats, rashes, weight loss, or chest pains. There are no other signs of infection or systemic inflammation at the moment. She had been placed on Trelegy by her primary care provider which she took alongside theophyline which we discussed avoiding during her last visit. She was started on Trelegy with improvement, and continues to smoke. She is interested in smoking cessation.   She lives with her husband and his sister, for whom she provides care. She reports smoking a pack and a half a day and has done so since the age of 32 (around 44 pack year smoking history). 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  Ancillary information including prior medications, full medical/surgical/family/social histories, and PFTs (when available) are listed below and have been reviewed.   Review of Systems  Constitutional:  Negative for chills, fever and weight loss.  Respiratory:  Positive for cough, shortness of breath and wheezing. Negative for hemoptysis and sputum production.   Cardiovascular:  Negative for palpitations and leg swelling.  Gastrointestinal:  Negative for heartburn.  Skin:  Negative for rash.  Neurological:  Negative for dizziness.  Psychiatric/Behavioral:  Negative for depression.      Objective:   Vitals:   06/14/22 1453  BP: 132/74  Pulse: 77  Temp: 98.6 F (37 C)  TempSrc: Temporal  SpO2: 94%  Weight: 225 lb (102.1 kg)  Height: '5\' 8"'$  (1.727 m)   94% on RA  BMI Readings from Last 3 Encounters:  06/14/22 34.21 kg/m  05/16/22 31.68 kg/m  04/18/22 30.98 kg/m   Wt Readings from Last 3 Encounters:  06/14/22 225 lb (102.1 kg)  05/16/22 220 lb 12.8 oz (100.2 kg)  04/18/22 215 lb 14.4 oz (97.9 kg)    Physical Exam Constitutional:      General: She is not in acute distress.    Appearance: She is obese. She is  not ill-appearing.  HENT:     Head: Normocephalic.     Mouth/Throat:     Mouth: Mucous membranes are moist.  Eyes:     Extraocular Movements: Extraocular movements intact.  Cardiovascular:     Rate and Rhythm: Normal rate and regular rhythm.     Pulses: Normal pulses.     Heart sounds: Normal heart sounds.  Pulmonary:     Effort: No respiratory distress.     Breath sounds: No wheezing.  Abdominal:     General: There is no distension.     Palpations: Abdomen is soft.  Musculoskeletal:     Cervical back: Normal range of motion.  Neurological:     General: No focal deficit present.     Mental Status: She is alert and oriented to person, place, and time. Mental status is at baseline.     Ancillary Information    Past Medical History:  Diagnosis Date   Anemia    Anxiety    Arthritis    Cancer ([place])    skin cancer, removed   COPD (chronic obstructive pulmonary disease) ([place])    Depression    Diabetes mellitus without complication (HCC)    Dyspnea    Dysrhythmia    occasional pvc's. not being treated.   Fatigue    GERD (gastroesophageal reflux disease)    Headache    Heart murmur    Hypertension  Hypothyroidism    Pneumonia    Thyroid disease    Tremors of nervous system      Family History  Problem Relation Age of Onset   Lung cancer Mother    Alcohol abuse Sister    Arthritis Sister    Alcohol abuse Brother    Heart disease Brother 52   Heart attack Brother    Pancreatic cancer Maternal Aunt    Diabetes Maternal Aunt    Alcohol abuse Son    Drug abuse Son    Drug abuse Son    Heart disease Father 28   Diabetes Sister    Breast cancer Brother    Alcohol abuse Maternal Uncle    Alcohol abuse Maternal Grandmother    Diabetes Maternal Grandmother      Past Surgical History:  Procedure Laterality Date   APPENDECTOMY     CHOLECYSTECTOMY N/A 07/01/2019   Procedure: LAPAROSCOPIC CHOLECYSTECTOMY;  Surgeon: Olean Ree, MD;  Location: [facility];   Service: General;  Laterality: N/A;   COLECTOMY WITH COLOSTOMY CREATION/HARTMANN PROCEDURE N/A 09/03/2018   Error, this was not done.   COLONOSCOPY WITH PROPOFOL N/A 03/08/2019   Procedure: COLONOSCOPY WITH PROPOFOL;  Surgeon: Virgel Manifold, MD;  Location: [facility];  Service: Endoscopy;  Laterality: N/A;   CYSTOSCOPY WITH STENT PLACEMENT Bilateral 09/03/2018   Procedure: CYSTOSCOPY WITH STENT PLACEMENT-LIGHTED STENTS;  Surgeon: Hollice Espy, MD;  Location: [facility];  Service: Urology;  Laterality: Bilateral;   LAPAROSCOPIC SIGMOID COLECTOMY N/A 09/03/2018   Procedure: LAPAROSCOPIC SIGMOID COLECTOMY;  Surgeon: Olean Ree, MD;  Location: [facility];  Service: General;  Laterality: N/A;   percutaneous drainage tube  07/2018   2nd tube placed. not healing in colon, causing a fistula   TONSILLECTOMY     TOTAL KNEE ARTHROPLASTY Left 05/10/2021   Procedure: TOTAL KNEE ARTHROPLASTY;  Surgeon: Lovell Sheehan, MD;  Location: [facility];  Service: Orthopedics;  Laterality: Left;   TOTAL KNEE ARTHROPLASTY Right 01/10/2022   Procedure: TOTAL KNEE ARTHROPLASTY;  Surgeon: Lovell Sheehan, MD;  Location: [facility];  Service: Orthopedics;  Laterality: Right;   TUBAL LIGATION      Social History   Socioeconomic History   Marital status: Married    Spouse name: Clair Gulling   Number of children: 2   Years of education: Not on file   Highest education level: Not on file  Occupational History   Occupation: Careers information officer    Comment: currently employed  Tobacco Use   Smoking status: Every Day    Packs/day: 2.00    Years: 45.00    Total pack years: 90.00    Types: Cigarettes    Start date: 03/02/1980   Smokeless tobacco: Never   Tobacco comments:    1.5PPD 05/16/2022  Vaping Use   Vaping Use: Former   Devices: only for a month. did not like  Substance and Sexual Activity   Alcohol use: No    Alcohol/week: 0.0 standard drinks of alcohol   Drug use: No   Sexual activity: Not Currently  Other Topics  Concern   Not on file  Social History Narrative   Live with hubby at home, sister susie with down syndrome   Social Determinants of Health   Financial Resource Strain: Low Risk  (04/18/2022)   Overall Financial Resource Strain (CARDIA)    Difficulty of Paying Living Expenses: Not hard at all  Food Insecurity: No Food Insecurity (04/18/2022)   Hunger Vital Sign    Worried About Running  Out of Food in the Last Year: Never true    Ran Out of Food in the Last Year: Never true  Transportation Needs: No Transportation Needs (04/18/2022)   PRAPARE - Hydrologist (Medical): No    Lack of Transportation (Non-Medical): No  Physical Activity: Insufficiently Active (04/18/2022)   Exercise Vital Sign    Days of Exercise per Week: 3 days    Minutes of Exercise per Session: 30 min  Stress: No Stress Concern Present (04/18/2022)   [place]    Feeling of Stress : Only a little  Social Connections: Socially Isolated (04/18/2022)   Social Connection and Isolation Panel [NHANES]    Frequency of Communication with Friends and Family: Once a week    Frequency of Social Gatherings with Friends and Family: Never    Attends Religious Services: Never    Marine scientist or Organizations: No    Attends Archivist Meetings: Never    Marital Status: Married  Human resources officer Violence: Not At Risk (04/18/2022)   Humiliation, Afraid, Rape, and Kick questionnaire    Fear of Current or Ex-Partner: No    Emotionally Abused: No    Physically Abused: No    Sexually Abused: No     Allergies  Allergen Reactions   Dexilant [Dexlansoprazole] Nausea Only and Other (See Comments)    Dizziness     CBC    Component Value Date/Time   WBC 8.0 12/24/2021 1524   RBC 4.47 12/24/2021 1524   HGB 13.8 12/24/2021 1524   HGB 14.8 09/27/2021 0950   HCT 42.5 12/24/2021 1524   HCT 44.2 09/27/2021 0950   PLT 247  12/24/2021 1524   PLT 300 09/27/2021 0950   MCV 95.1 12/24/2021 1524   MCV 93 09/27/2021 0950   MCH 30.9 12/24/2021 1524   MCHC 32.5 12/24/2021 1524   RDW 12.7 12/24/2021 1524   RDW 14.3 09/27/2021 0950   LYMPHSABS 1.9 04/06/2021 0846   MONOABS 0.7 07/06/2019 0320   EOSABS 0.2 04/06/2021 0846   BASOSABS 0.1 04/06/2021 0846    Pulmonary Functions Testing Results:     No data to display          Outpatient Medications Prior to Visit  Medication Sig Dispense Refill   acetaminophen (TYLENOL) 500 MG tablet Take 1,000 mg by mouth every 6 (six) hours as needed for mild pain or moderate pain.      albuterol (VENTOLIN HFA) 108 (90 Base) MCG/ACT inhaler INHALE 2 PUFFS INTO THE LUNGS EVERY 4 HOURS AS NEEDED FOR WHEEZING OR SHORTNESS OF BREATH 8.5 g 3   aspirin 81 MG chewable tablet Chew 1 tablet (81 mg total) by mouth 2 (two) times daily. 60 tablet 0   benzonatate (TESSALON) 200 MG capsule TAKE 1 CAPSULE BY MOUTH 3 TIMES DAILY ASNEEDED 90 capsule 0   buPROPion (WELLBUTRIN XL) 150 MG 24 hr tablet TAKE 1 TABLET BY MOUTH ONCE DAILY. *MAKEAPPOINTMENT FOR FURTHER FILLS* 30 tablet 0   Cholecalciferol (VITAMIN D) 50 MCG (2000 UT) tablet Take 2,000 Units by mouth daily.     clonazePAM (KLONOPIN) 0.5 MG tablet      diclofenac (VOLTAREN) 50 MG EC tablet      diclofenac Sodium (VOLTAREN) 1 % GEL      DULoxetine (CYMBALTA) 60 MG capsule Take 1 capsule (60 mg total) by mouth 2 (two) times daily. Please schedule office visit before any future refill. 60 capsule  0   ezetimibe (ZETIA) 10 MG tablet      fluconazole (DIFLUCAN) 150 MG tablet Take 1 tablet by mouth, if symptoms still remain after 3 days- take additional tablet by mouth. 2 tablet 0   fluticasone (FLONASE) 50 MCG/ACT nasal spray USE 2 SPRAYS IN EACH NOSTRIL ONCE DAILY 48 g 1   Fluticasone-Umeclidin-Vilant (TRELEGY ELLIPTA) 200-62.5-25 MCG/ACT AEPB Inhale 1 puff into the lungs 2 (two) times daily. 60 each 11   gabapentin (NEURONTIN) 300 MG  capsule TAKE 3 CAPSULES BY MOUTH ONCE EVERY MORNING AND 5 CAPSULES AT BEDTIME 240 capsule 5   glucose blood (CONTOUR NEXT TEST) test strip To check blood sugar once daily 100 each 12   hydrOXYzine (ATARAX) 10 MG tablet Take 1 tablet (10 mg total) by mouth 3 (three) times daily as needed. 30 tablet 0   ibuprofen (ADVIL) 600 MG tablet      ipratropium-albuterol (DUONEB) 0.5-2.5 (3) MG/3ML SOLN Take 3 mLs by nebulization every 6 (six) hours as needed. 360 mL 11   levothyroxine (SYNTHROID) 75 MCG tablet Take 1 tablet (75 mcg total) by mouth daily before breakfast. 90 tablet 3   loperamide (IMODIUM A-D) 2 MG tablet Take 2-4 mg by mouth 4 (four) times daily as needed for diarrhea or loose stools.     meloxicam (MOBIC) 15 MG tablet Take 1 tablet (15 mg total) by mouth daily. 90 tablet 1   meloxicam (MOBIC) 7.5 MG tablet Take 1 tablet by mouth 2 (two) times daily with a meal.     metaxalone (SKELAXIN) 800 MG tablet TAKE 1 TABLET BY MOUTH 3 TIMES DAILY FORMUSCLE SPASMS 270 tablet 0   metFORMIN (GLUCOPHAGE-XR) 500 MG 24 hr tablet TAKE 2 TABLETS BY MOUTH ONCE A DAY WITH SUPPER. Please schedule office visit before any future refill. 60 tablet 0   methocarbamol (ROBAXIN) 500 MG tablet      metoprolol succinate (TOPROL-XL) 50 MG 24 hr tablet Take 1 tablet (50 mg total) by mouth daily. Please schedule office visit before any future refill. 30 tablet 0   metoprolol tartrate (LOPRESSOR) 25 MG tablet      nitrofurantoin, macrocrystal-monohydrate, (MACROBID) 100 MG capsule      nystatin (MYCOSTATIN) 100000 UNIT/ML suspension Take 5 mLs (500,000 Units total) by mouth 4 (four) times daily. 473 mL 0   nystatin (MYCOSTATIN/NYSTOP) powder Apply 1 application topically 3 (three) times daily. 60 g 0   oxybutynin (DITROPAN-XL) 5 MG 24 hr tablet Take 1 tablet (5 mg total) by mouth at bedtime. 90 tablet 5   primidone (MYSOLINE) 50 MG tablet Take 50 mg by mouth 2 (two) times daily.     promethazine-dextromethorphan  (PROMETHAZINE-DM) 6.25-15 MG/5ML syrup      rosuvastatin (CRESTOR) 10 MG tablet Take 1 tablet (10 mg total) by mouth daily. 90 tablet 3   rosuvastatin (CRESTOR) 5 MG tablet      Spacer/Aero-Holding Chambers (AEROCHAMBER PLUS WITH MASK) inhaler      theophylline (THEO-24) 300 MG 24 hr capsule Take 1 capsule (300 mg total) by mouth daily. 90 capsule 1   traMADol (ULTRAM) 50 MG tablet Take 1 tablet by mouth every 4 (four) hours as needed.     traZODone (DESYREL) 100 MG tablet Take 2 tablets (200 mg total) by mouth at bedtime as needed for sleep. 180 tablet 3   traZODone (DESYREL) 50 MG tablet      triamcinolone ointment (KENALOG) 0.5 % Apply 1 application. topically 2 (two) times daily. 30 g 0  varenicline (CHANTIX) 0.5 MG tablet      vitamin B-12 (CYANOCOBALAMIN) 1000 MCG tablet Take 1,000 mcg by mouth daily.     No facility-administered medications prior to visit.

## 2022-06-14 NOTE — Patient Instructions (Signed)
[facility] [place] [facility]: Call 1-800-QUIT-NOW (1-800-784-8669). The [place] [facility]uit[facility] is a free service for [place] residents. Trained counselors are available from 8 am until 3 am, 365 days per year. Services are available in both English and Spanish.   Web Resources Free online support programs can help you track your progress and share experiences with others who are quitting. These are examples: www.becomeanex.org www.trytostop.org  www.smokefree.gov  www.everydayhealth.com/smoking-cessation/index.aspx  [facility]: offers comprehensive in-person tobacco treatment counseling at [facility] building ([address] [place] [address]).  Open to everyone. Virtual appointments available. Free parking. Call [phone] to schedule an appointment or [phone] for general information.    Tobacco Cessation Medications  Nicotine Replacement Therapy (NRT)  Nicotine is the addictive part of tobacco smoke, but not the most dangerous part. There are 7000 other toxins in cigarettes, including carbon monoxide, that cause disease. People do not generally become addicted to medication. Common problems: People don't use enough medication or stop too early. Medications are safe and effective. Overdose is very uncommon. Use medications as long as needed (3 months minimum). Some combinations work better than single medications. Long acting medications like the NRT patch and bupropion provide continuous treatment for withdrawal symptoms.  PLUS  Short acting medications like the NRT gum, lozenge, inhaler, and nasal spray help people to cope with breakthrough cravings.  ? Nicotine Patch  Place patch on hairless skin on upper body, including arms and back. Each day: discard old patch, shower, apply new patch to a different site. Apply hydrocortisone cream to mildly red/irritated areas. Call provider if rash develops. If patch causes sleep disturbance, remove patch  at bedtime and replace each morning after shower. Side effects may include: skin irritation, headache, insomnia, abnormal/vivid dreams.   ? Nicotine Lozenge  Allow to dissolve slowly in mouth (20-30 minutes). Do not chew or swallow. Nicotine release may cause a warm tingling sensation. Occasionally rotate to different areas of the mouth. Use enough to control cravings, up to 20 lozenges per day (if used alone). Avoid eating or drinking for 15 minutes before using and during use. Side effects may include: nausea, hiccups, cough, heartburn, headache, gas, insomnia. 

## 2022-06-16 ENCOUNTER — Other Ambulatory Visit: Payer: Self-pay | Admitting: Family Medicine

## 2022-06-16 DIAGNOSIS — F32A Depression, unspecified: Secondary | ICD-10-CM

## 2022-06-16 NOTE — Telephone Encounter (Signed)
Courtesy refill. Called patient to schedule appt for medication refills. No answer, LVMTCB.  Requested Prescriptions  Pending Prescriptions Disp Refills   DULoxetine (CYMBALTA) 60 MG capsule [Pharmacy Med Name: DULOXETINE HCL 60 MG CAP] 60 capsule 0    Sig: TAKE 1 CAPSULE BY MOUTH TWICE DAILY     Psychiatry: Antidepressants - SNRI - duloxetine Failed - 06/16/2022  8:22 AM      Failed - Valid encounter within last 6 months    Recent Outpatient Visits           6 months ago Encounter for health maintenance examination   [facility] Thedore Mins, [place], PA-C   8 months ago [place] of mouth and esophagus [facility])   [facility] Tally Joe T, FNP   8 months ago Dyspnea on exertion   [facility] Birdie Sons, MD   9 months ago Chronic obstructive pulmonary disease with acute exacerbation [facility])   [facility] Tally Joe T, FNP   1 year ago Diabetes mellitus without complication [facility])   [place], Vickki Muff, PA-C              Passed - Cr in normal range and within 360 days    Creat  Date Value Ref Range Status  05/19/2017 0.58 0.50 - 0.99 mg/dL Final    Comment:    For patients >39 years of age, the reference limit for Creatinine is approximately 13% higher for people identified as African-American. .    Creatinine, Ser  Date Value Ref Range Status  12/24/2021 0.67 0.44 - 1.00 mg/dL Final         Passed - eGFR is 30 or above and within 360 days    GFR, Est African American  Date Value Ref Range Status  05/19/2017 115 > OR = 60 mL/min/1.70m Final   GFR calc Af Amer  Date Value Ref Range Status  07/06/2019 >60 >60 mL/min Final   GFR, Est Non African American  Date Value Ref Range Status  05/19/2017 99 > OR = 60 mL/min/1.7103mFinal   GFR, Estimated  Date Value Ref Range Status  12/24/2021 >60 >60 mL/min Final    Comment:    (NOTE) Calculated using the CKD-EPI Creatinine Equation  (2021)    eGFR  Date Value Ref Range Status  12/07/2021 90 >59 mL/min/1.73 Final         Passed - Completed PHQ-2 or PHQ-9 in the last 360 days      Passed - Last BP in normal range    BP Readings from Last 1 Encounters:  06/14/22 132/74

## 2022-06-16 NOTE — Telephone Encounter (Signed)
Called patient to schedule appt for medication refills. No answer, LVMTCB [phone].

## 2022-06-23 ENCOUNTER — Other Ambulatory Visit: Payer: Self-pay | Admitting: Family Medicine

## 2022-06-23 ENCOUNTER — Emergency Department: Payer: Medicare PPO

## 2022-06-23 ENCOUNTER — Inpatient Hospital Stay
Admission: EM | Admit: 2022-06-23 | Discharge: 2022-07-01 | DRG: 305 | Disposition: A | Discharge status: Skilled Nursing Facility | Payer: Medicare PPO | Attending: Internal Medicine | Admitting: Internal Medicine

## 2022-06-23 ENCOUNTER — Encounter: Payer: Self-pay | Admitting: Emergency Medicine

## 2022-06-23 ENCOUNTER — Emergency Department
Admission: EM | Admit: 2022-06-23 | Discharge: 2022-06-23 | Disposition: A | Discharge status: Home / Self Care | Payer: Medicare PPO | Source: Home / Self Care | Attending: Emergency Medicine | Admitting: Emergency Medicine

## 2022-06-23 ENCOUNTER — Other Ambulatory Visit: Payer: Self-pay

## 2022-06-23 DIAGNOSIS — M5416 Radiculopathy, lumbar region: Secondary | ICD-10-CM | POA: Insufficient documentation

## 2022-06-23 DIAGNOSIS — J42 Unspecified chronic bronchitis: Secondary | ICD-10-CM | POA: Diagnosis present

## 2022-06-23 DIAGNOSIS — E1165 Type 2 diabetes mellitus with hyperglycemia: Secondary | ICD-10-CM | POA: Diagnosis present

## 2022-06-23 DIAGNOSIS — Z7984 Long term (current) use of oral hypoglycemic drugs: Secondary | ICD-10-CM

## 2022-06-23 DIAGNOSIS — E871 Hypo-osmolality and hyponatremia: Secondary | ICD-10-CM | POA: Insufficient documentation

## 2022-06-23 DIAGNOSIS — Z6833 Body mass index (BMI) 33.0-33.9, adult: Secondary | ICD-10-CM

## 2022-06-23 DIAGNOSIS — Z79890 Hormone replacement therapy (postmenopausal): Secondary | ICD-10-CM

## 2022-06-23 DIAGNOSIS — I10 Essential (primary) hypertension: Secondary | ICD-10-CM | POA: Insufficient documentation

## 2022-06-23 DIAGNOSIS — G894 Chronic pain syndrome: Secondary | ICD-10-CM | POA: Diagnosis present

## 2022-06-23 DIAGNOSIS — F32A Depression, unspecified: Secondary | ICD-10-CM | POA: Diagnosis present

## 2022-06-23 DIAGNOSIS — E119 Type 2 diabetes mellitus without complications: Secondary | ICD-10-CM | POA: Insufficient documentation

## 2022-06-23 DIAGNOSIS — K5900 Constipation, unspecified: Secondary | ICD-10-CM | POA: Diagnosis not present

## 2022-06-23 DIAGNOSIS — E782 Mixed hyperlipidemia: Secondary | ICD-10-CM | POA: Diagnosis present

## 2022-06-23 DIAGNOSIS — I160 Hypertensive urgency: Principal | ICD-10-CM | POA: Diagnosis present

## 2022-06-23 DIAGNOSIS — W19XXXA Unspecified fall, initial encounter: Secondary | ICD-10-CM | POA: Insufficient documentation

## 2022-06-23 DIAGNOSIS — Z79899 Other long term (current) drug therapy: Secondary | ICD-10-CM

## 2022-06-23 DIAGNOSIS — Z85828 Personal history of other malignant neoplasm of skin: Secondary | ICD-10-CM | POA: Insufficient documentation

## 2022-06-23 DIAGNOSIS — F17210 Nicotine dependence, cigarettes, uncomplicated: Secondary | ICD-10-CM | POA: Diagnosis present

## 2022-06-23 DIAGNOSIS — Z96653 Presence of artificial knee joint, bilateral: Secondary | ICD-10-CM | POA: Diagnosis present

## 2022-06-23 DIAGNOSIS — Z8249 Family history of ischemic heart disease and other diseases of the circulatory system: Secondary | ICD-10-CM

## 2022-06-23 DIAGNOSIS — E872 Acidosis, unspecified: Secondary | ICD-10-CM | POA: Diagnosis present

## 2022-06-23 DIAGNOSIS — G8929 Other chronic pain: Secondary | ICD-10-CM

## 2022-06-23 DIAGNOSIS — E876 Hypokalemia: Secondary | ICD-10-CM | POA: Insufficient documentation

## 2022-06-23 DIAGNOSIS — M1990 Unspecified osteoarthritis, unspecified site: Secondary | ICD-10-CM | POA: Diagnosis present

## 2022-06-23 DIAGNOSIS — F419 Anxiety disorder, unspecified: Secondary | ICD-10-CM | POA: Diagnosis present

## 2022-06-23 DIAGNOSIS — Z833 Family history of diabetes mellitus: Secondary | ICD-10-CM

## 2022-06-23 DIAGNOSIS — M546 Pain in thoracic spine: Secondary | ICD-10-CM

## 2022-06-23 DIAGNOSIS — J418 Mixed simple and mucopurulent chronic bronchitis: Secondary | ICD-10-CM

## 2022-06-23 DIAGNOSIS — G250 Essential tremor: Secondary | ICD-10-CM

## 2022-06-23 DIAGNOSIS — J4489 Other specified chronic obstructive pulmonary disease: Secondary | ICD-10-CM | POA: Diagnosis present

## 2022-06-23 DIAGNOSIS — Z9049 Acquired absence of other specified parts of digestive tract: Secondary | ICD-10-CM

## 2022-06-23 DIAGNOSIS — Z7982 Long term (current) use of aspirin: Secondary | ICD-10-CM

## 2022-06-23 DIAGNOSIS — E039 Hypothyroidism, unspecified: Secondary | ICD-10-CM | POA: Diagnosis present

## 2022-06-23 DIAGNOSIS — M797 Fibromyalgia: Secondary | ICD-10-CM | POA: Diagnosis present

## 2022-06-23 DIAGNOSIS — G2581 Restless legs syndrome: Secondary | ICD-10-CM | POA: Diagnosis present

## 2022-06-23 DIAGNOSIS — G9341 Metabolic encephalopathy: Secondary | ICD-10-CM | POA: Diagnosis not present

## 2022-06-23 DIAGNOSIS — K219 Gastro-esophageal reflux disease without esophagitis: Secondary | ICD-10-CM | POA: Diagnosis present

## 2022-06-23 DIAGNOSIS — Z8261 Family history of arthritis: Secondary | ICD-10-CM

## 2022-06-23 DIAGNOSIS — J449 Chronic obstructive pulmonary disease, unspecified: Secondary | ICD-10-CM | POA: Insufficient documentation

## 2022-06-23 DIAGNOSIS — M545 Low back pain, unspecified: Secondary | ICD-10-CM

## 2022-06-23 DIAGNOSIS — G4730 Sleep apnea, unspecified: Secondary | ICD-10-CM | POA: Diagnosis present

## 2022-06-23 DIAGNOSIS — R519 Headache, unspecified: Secondary | ICD-10-CM | POA: Diagnosis not present

## 2022-06-23 DIAGNOSIS — Z9851 Tubal ligation status: Secondary | ICD-10-CM

## 2022-06-23 DIAGNOSIS — Z888 Allergy status to other drugs, medicaments and biological substances status: Secondary | ICD-10-CM

## 2022-06-23 DIAGNOSIS — M48061 Spinal stenosis, lumbar region without neurogenic claudication: Secondary | ICD-10-CM | POA: Diagnosis present

## 2022-06-23 DIAGNOSIS — E669 Obesity, unspecified: Secondary | ICD-10-CM | POA: Diagnosis present

## 2022-06-23 LAB — CBC WITH DIFFERENTIAL/PLATELET
Abs Immature Granulocytes: 0.02 10*3/uL (ref 0.00–0.07)
Basophils Absolute: 0.1 10*3/uL (ref 0.0–0.1)
Basophils Relative: 1 %
Eosinophils Absolute: 0.2 10*3/uL (ref 0.0–0.5)
Eosinophils Relative: 3 %
HCT: 43.7 % (ref 36.0–46.0)
Hemoglobin: 14.6 g/dL (ref 12.0–15.0)
Immature Granulocytes: 0 %
Lymphocytes Relative: 22 %
Lymphs Abs: 1.3 10*3/uL (ref 0.7–4.0)
MCH: 30.1 pg (ref 26.0–34.0)
MCHC: 33.4 g/dL (ref 30.0–36.0)
MCV: 90.1 fL (ref 80.0–100.0)
Monocytes Absolute: 0.5 10*3/uL (ref 0.1–1.0)
Monocytes Relative: 8 %
Neutro Abs: 3.7 10*3/uL (ref 1.7–7.7)
Neutrophils Relative %: 66 %
Platelets: 241 10*3/uL (ref 150–400)
RBC: 4.85 MIL/uL (ref 3.87–5.11)
RDW: 14.6 % (ref 11.5–15.5)
WBC: 5.6 10*3/uL (ref 4.0–10.5)
nRBC: 0 % (ref 0.0–0.2)

## 2022-06-23 LAB — COMPREHENSIVE METABOLIC PANEL
ALT: 19 U/L (ref 0–44)
AST: 21 U/L (ref 15–41)
Albumin: 4.1 g/dL (ref 3.5–5.0)
Alkaline Phosphatase: 91 U/L (ref 38–126)
Anion gap: 10 (ref 5–15)
BUN: 11 mg/dL (ref 8–23)
CO2: 27 mmol/L (ref 22–32)
Calcium: 9.2 mg/dL (ref 8.9–10.3)
Chloride: 96 mmol/L — ABNORMAL LOW (ref 98–111)
Creatinine, Ser: 0.51 mg/dL (ref 0.44–1.00)
GFR, Estimated: >60 mL/min (ref >60)
Glucose, Bld: 185 mg/dL — ABNORMAL HIGH (ref 70–99)
Potassium: 4.1 mmol/L (ref 3.5–5.1)
Sodium: 133 mmol/L — ABNORMAL LOW (ref 135–145)
Total Bilirubin: 1 mg/dL (ref 0.3–1.2)
Total Protein: 7.8 g/dL (ref 6.5–8.1)

## 2022-06-23 LAB — BASIC METABOLIC PANEL
Anion gap: 8 (ref 5–15)
BUN: 12 mg/dL (ref 8–23)
CO2: 26 mmol/L (ref 22–32)
Calcium: 9.6 mg/dL (ref 8.9–10.3)
Chloride: 96 mmol/L — ABNORMAL LOW (ref 98–111)
Creatinine, Ser: 0.62 mg/dL (ref 0.44–1.00)
GFR, Estimated: >60 mL/min (ref >60)
Glucose, Bld: 225 mg/dL — ABNORMAL HIGH (ref 70–99)
Potassium: 4.5 mmol/L (ref 3.5–5.1)
Sodium: 130 mmol/L — ABNORMAL LOW (ref 135–145)

## 2022-06-23 LAB — CBG MONITORING, ED
Glucose-Capillary: 234 mg/dL — ABNORMAL HIGH (ref 70–99)
Glucose-Capillary: 168 mg/dL — ABNORMAL HIGH (ref 70–99)

## 2022-06-23 LAB — CBC
HCT: 44.1 % (ref 36.0–46.0)
Hemoglobin: 15 g/dL (ref 12.0–15.0)
MCH: 30.3 pg (ref 26.0–34.0)
MCHC: 34 g/dL (ref 30.0–36.0)
MCV: 89.1 fL (ref 80.0–100.0)
Platelets: 236 10*3/uL (ref 150–400)
RBC: 4.95 MIL/uL (ref 3.87–5.11)
RDW: 14.4 % (ref 11.5–15.5)
WBC: 5.1 10*3/uL (ref 4.0–10.5)
nRBC: 0 % (ref 0.0–0.2)

## 2022-06-23 LAB — TROPONIN I (HIGH SENSITIVITY): Troponin I (High Sensitivity): 10 ng/L (ref <18)

## 2022-06-23 LAB — LIPASE, BLOOD: Lipase: 28 U/L (ref 11–51)

## 2022-06-23 LAB — LACTIC ACID, PLASMA: Lactic Acid, Venous: 1.5 mmol/L (ref 0.5–1.9)

## 2022-06-23 MED ORDER — ALBUTEROL SULFATE (2.5 MG/3ML) 0.083% IN NEBU: 2.5000 mg | INHALATION_SOLUTION | RESPIRATORY_TRACT | Status: DC | PRN

## 2022-06-23 MED ORDER — METOPROLOL TARTRATE 5 MG/5ML IV SOLN
5.0000 mg | Freq: Once | INTRAVENOUS | Status: AC
  Administered 2022-06-23: 5 mg via INTRAVENOUS
  Filled 2022-06-23: qty 5

## 2022-06-23 MED ORDER — LABETALOL HCL 5 MG/ML IV SOLN
10.0000 mg | INTRAVENOUS | Status: DC | PRN
  Administered 2022-06-23 – 2022-06-25 (×7): 10 mg via INTRAVENOUS
  Filled 2022-06-23 (×7): qty 4

## 2022-06-23 MED ORDER — HYDRALAZINE HCL 20 MG/ML IJ SOLN
10.0000 mg | Freq: Once | INTRAMUSCULAR | Status: AC
  Administered 2022-06-23: 10 mg via INTRAVENOUS
  Filled 2022-06-23: qty 1

## 2022-06-23 MED ORDER — NICOTINE POLACRILEX 2 MG MT GUM
2.0000 mg | CHEWING_GUM | OROMUCOSAL | Status: DC | PRN
  Filled 2022-06-23: qty 1

## 2022-06-23 MED ORDER — INSULIN ASPART 100 UNIT/ML IJ SOLN
0.0000 [IU] | Freq: Every day | INTRAMUSCULAR | Status: DC
  Administered 2022-06-24 – 2022-06-27 (×4): 2 [IU] via SUBCUTANEOUS
  Filled 2022-06-23 (×3): qty 1

## 2022-06-23 MED ORDER — SODIUM CHLORIDE 0.9% FLUSH
3.0000 mL | Freq: Two times a day (BID) | INTRAVENOUS | Status: DC
  Administered 2022-06-24 – 2022-07-01 (×14): 3 mL via INTRAVENOUS

## 2022-06-23 MED ORDER — NICOTINE 7 MG/24HR TD PT24
7.0000 mg | MEDICATED_PATCH | Freq: Every day | TRANSDERMAL | Status: DC
  Administered 2022-06-23 – 2022-07-01 (×8): 7 mg via TRANSDERMAL
  Filled 2022-06-23 (×9): qty 1

## 2022-06-23 MED ORDER — OXYCODONE HCL 5 MG PO TABS
5.0000 mg | ORAL_TABLET | ORAL | Status: DC | PRN
  Administered 2022-06-23 – 2022-06-26 (×5): 5 mg via ORAL
  Filled 2022-06-23 (×5): qty 1

## 2022-06-23 MED ORDER — NAPROXEN 500 MG PO TABS: 500.0000 mg | ORAL_TABLET | Freq: Two times a day (BID) | ORAL | 0 refills | Status: DC

## 2022-06-23 MED ORDER — UMECLIDINIUM BROMIDE 62.5 MCG/ACT IN AEPB
1.0000 | INHALATION_SPRAY | Freq: Every day | RESPIRATORY_TRACT | Status: DC
  Administered 2022-06-24 – 2022-07-01 (×8): 1 via RESPIRATORY_TRACT
  Filled 2022-06-23 (×2): qty 7

## 2022-06-23 MED ORDER — SENNOSIDES-DOCUSATE SODIUM 8.6-50 MG PO TABS
1.0000 | ORAL_TABLET | Freq: Every evening | ORAL | Status: DC | PRN
  Administered 2022-06-25 – 2022-06-28 (×3): 1 via ORAL
  Filled 2022-06-23 (×3): qty 1

## 2022-06-23 MED ORDER — INSULIN ASPART 100 UNIT/ML IJ SOLN
0.0000 [IU] | Freq: Three times a day (TID) | INTRAMUSCULAR | Status: DC
  Administered 2022-06-24 – 2022-06-26 (×6): 2 [IU] via SUBCUTANEOUS
  Administered 2022-06-26: 1 [IU] via SUBCUTANEOUS
  Administered 2022-06-26: 3 [IU] via SUBCUTANEOUS
  Administered 2022-06-27 – 2022-06-28 (×4): 2 [IU] via SUBCUTANEOUS
  Filled 2022-06-23 (×12): qty 1

## 2022-06-23 MED ORDER — KETOROLAC TROMETHAMINE 15 MG/ML IJ SOLN
15.0000 mg | Freq: Once | INTRAMUSCULAR | Status: AC
  Administered 2022-06-23: 15 mg via INTRAVENOUS
  Filled 2022-06-23: qty 1

## 2022-06-23 MED ORDER — IOHEXOL 350 MG/ML SOLN
100.0000 mL | Freq: Once | INTRAVENOUS | Status: AC | PRN
  Administered 2022-06-23: 100 mL via INTRAVENOUS

## 2022-06-23 MED ORDER — ONDANSETRON HCL 4 MG/2ML IJ SOLN
4.0000 mg | INTRAMUSCULAR | Status: AC
  Administered 2022-06-23: 4 mg via INTRAVENOUS
  Filled 2022-06-23: qty 2

## 2022-06-23 MED ORDER — ENOXAPARIN SODIUM 60 MG/0.6ML IJ SOSY
0.5000 mg/kg | PREFILLED_SYRINGE | INTRAMUSCULAR | Status: DC
  Administered 2022-06-23 – 2022-06-30 (×8): 50 mg via SUBCUTANEOUS
  Filled 2022-06-23 (×8): qty 0.6

## 2022-06-23 MED ORDER — ONDANSETRON HCL 4 MG/2ML IJ SOLN
4.0000 mg | Freq: Four times a day (QID) | INTRAMUSCULAR | Status: DC | PRN
  Administered 2022-06-24 – 2022-06-25 (×2): 4 mg via INTRAVENOUS
  Filled 2022-06-23 (×2): qty 2

## 2022-06-23 MED ORDER — MORPHINE SULFATE (PF) 4 MG/ML IV SOLN
4.0000 mg | Freq: Once | INTRAVENOUS | Status: AC
  Administered 2022-06-23: 4 mg via INTRAVENOUS
  Filled 2022-06-23: qty 1

## 2022-06-23 MED ORDER — LIDOCAINE 5 % EX PTCH
1.0000 | MEDICATED_PATCH | CUTANEOUS | Status: DC
  Administered 2022-06-23: 1 via TRANSDERMAL
  Filled 2022-06-23: qty 1

## 2022-06-23 MED ORDER — GABAPENTIN 400 MG PO CAPS
1500.0000 mg | ORAL_CAPSULE | Freq: Every day | ORAL | Status: DC
  Administered 2022-06-23 – 2022-06-27 (×5): 1500 mg via ORAL
  Filled 2022-06-23 (×3): qty 1
  Filled 2022-06-23 (×2): qty 5

## 2022-06-23 MED ORDER — FLUTICASONE FUROATE-VILANTEROL 200-25 MCG/ACT IN AEPB
1.0000 | INHALATION_SPRAY | Freq: Every day | RESPIRATORY_TRACT | Status: DC
  Administered 2022-06-24 – 2022-07-01 (×8): 1 via RESPIRATORY_TRACT
  Filled 2022-06-23 (×2): qty 28

## 2022-06-23 MED ORDER — METAXALONE 800 MG PO TABS
800.0000 mg | ORAL_TABLET | Freq: Three times a day (TID) | ORAL | Status: DC
  Administered 2022-06-23 – 2022-06-27 (×11): 800 mg via ORAL
  Filled 2022-06-23 (×14): qty 1

## 2022-06-23 MED ORDER — LIDOCAINE 5 % EX PTCH
1.0000 | MEDICATED_PATCH | Freq: Two times a day (BID) | CUTANEOUS | Status: DC
  Administered 2022-06-23 – 2022-06-28 (×9): 1 via TRANSDERMAL
  Filled 2022-06-23 (×13): qty 1

## 2022-06-23 MED ORDER — ACETAMINOPHEN 650 MG RE SUPP: 650.0000 mg | Freq: Four times a day (QID) | RECTAL | Status: DC | PRN

## 2022-06-23 MED ORDER — DULOXETINE HCL 30 MG PO CPEP
60.0000 mg | ORAL_CAPSULE | Freq: Two times a day (BID) | ORAL | Status: DC
  Administered 2022-06-23 – 2022-07-01 (×16): 60 mg via ORAL
  Filled 2022-06-23 (×4): qty 2
  Filled 2022-06-23: qty 1
  Filled 2022-06-23 (×3): qty 2
  Filled 2022-06-23: qty 1
  Filled 2022-06-23 (×4): qty 2
  Filled 2022-06-23: qty 1
  Filled 2022-06-23 (×2): qty 2

## 2022-06-23 MED ORDER — HYDROXYZINE HCL 10 MG PO TABS: 10.0000 mg | ORAL_TABLET | Freq: Three times a day (TID) | ORAL | Status: DC | PRN

## 2022-06-23 MED ORDER — GABAPENTIN 300 MG PO CAPS
900.0000 mg | ORAL_CAPSULE | Freq: Every morning | ORAL | Status: DC
  Administered 2022-06-24 – 2022-06-27 (×4): 900 mg via ORAL
  Filled 2022-06-23 (×4): qty 3

## 2022-06-23 MED ORDER — ROSUVASTATIN CALCIUM 10 MG PO TABS
10.0000 mg | ORAL_TABLET | Freq: Every day | ORAL | Status: DC
  Administered 2022-06-24 – 2022-07-01 (×8): 10 mg via ORAL
  Filled 2022-06-23 (×5): qty 1
  Filled 2022-06-23: qty 0.5
  Filled 2022-06-23 (×3): qty 1

## 2022-06-23 MED ORDER — BUPROPION HCL ER (XL) 150 MG PO TB24
150.0000 mg | ORAL_TABLET | Freq: Every day | ORAL | Status: DC
  Administered 2022-06-24 – 2022-07-01 (×8): 150 mg via ORAL
  Filled 2022-06-23 (×8): qty 1

## 2022-06-23 MED ORDER — LEVOTHYROXINE SODIUM 50 MCG PO TABS
75.0000 ug | ORAL_TABLET | Freq: Every day | ORAL | Status: DC
  Administered 2022-06-24 – 2022-07-01 (×8): 75 ug via ORAL
  Filled 2022-06-23 (×3): qty 1
  Filled 2022-06-23: qty 2
  Filled 2022-06-23 (×4): qty 1

## 2022-06-23 MED ORDER — ACETAMINOPHEN 325 MG PO TABS
650.0000 mg | ORAL_TABLET | Freq: Four times a day (QID) | ORAL | Status: DC | PRN
  Administered 2022-06-27 – 2022-07-01 (×6): 650 mg via ORAL
  Filled 2022-06-23 (×7): qty 2

## 2022-06-23 MED ORDER — BISACODYL 5 MG PO TBEC
5.0000 mg | DELAYED_RELEASE_TABLET | Freq: Every day | ORAL | Status: DC | PRN
  Administered 2022-06-24 – 2022-06-29 (×3): 5 mg via ORAL
  Filled 2022-06-23 (×3): qty 1

## 2022-06-23 MED ORDER — LIDOCAINE 5 % EX PTCH: 1.0000 | MEDICATED_PATCH | Freq: Two times a day (BID) | CUTANEOUS | 0 refills | Status: DC

## 2022-06-23 MED ORDER — ASPIRIN 81 MG PO CHEW
81.0000 mg | CHEWABLE_TABLET | Freq: Two times a day (BID) | ORAL | Status: DC
  Administered 2022-06-24 – 2022-07-01 (×14): 81 mg via ORAL
  Filled 2022-06-23 (×15): qty 1

## 2022-06-23 MED ORDER — METOPROLOL SUCCINATE ER 50 MG PO TB24
50.0000 mg | ORAL_TABLET | Freq: Every day | ORAL | Status: DC
  Administered 2022-06-24 – 2022-07-01 (×8): 50 mg via ORAL
  Filled 2022-06-23 (×8): qty 1

## 2022-06-23 MED ORDER — ONDANSETRON HCL 4 MG PO TABS
4.0000 mg | ORAL_TABLET | Freq: Four times a day (QID) | ORAL | Status: DC | PRN
  Administered 2022-07-01: 4 mg via ORAL
  Filled 2022-06-23: qty 1

## 2022-06-23 MED ORDER — HYDROMORPHONE HCL 1 MG/ML IJ SOLN
0.5000 mg | INTRAMUSCULAR | Status: DC | PRN
  Administered 2022-06-24 – 2022-06-25 (×7): 0.5 mg via INTRAVENOUS
  Filled 2022-06-23 (×6): qty 0.5
  Filled 2022-06-23: qty 1
  Filled 2022-06-23: qty 0.5

## 2022-06-23 NOTE — Progress Notes (Signed)
Anticoagulation monitoring(Lovenox):  66 yo female ordered Lovenox 40 mg Q24h    Filed Weights   06/23/22 1804  Weight: 99.8 kg (220 lb)   BMI 33.5   Lab Results  Component Value Date   CREATININE 0.51 06/23/2022   CREATININE 0.62 06/23/2022   CREATININE 0.67 12/24/2021   Estimated Creatinine Clearance: 85.5 mL/min (by C-G formula based on SCr of 0.51 mg/dL). Hemoglobin & Hematocrit     Component Value Date/Time   HGB 15.0 06/23/2022 1827   HGB 14.8 09/27/2021 0950   HCT 44.1 06/23/2022 1827   HCT 44.2 09/27/2021 0950     Per Protocol for Patient with estCrcl > 30 ml/min and BMI > 30, will transition to Lovenox 50 mg Q24h.

## 2022-06-23 NOTE — ED Notes (Signed)
This tech took pt a [place] sandwich tray. Pt stated, "I do not want anything." [place] sandwich tray left in pt rm in case pt changes her mind.

## 2022-06-23 NOTE — ED Provider Notes (Signed)
[facility] Provider Note    Event Date/Time   First MD Initiated Contact with Patient 06/23/22 [phone]     (approximate)   History   Back Pain   HPI  Linda Bradford is a 66 y.o. female with a past medical history of chronic back pain, followed by Dr. Harlow Mares with orthopedics, who presents today for evaluation of back pain.  Patient reports that her pain worsened after she was doing a lot of work in her garage this past week.  She reports that her pain primarily radiates down her left leg, and she felt that her pain was so severe that she her legs gave out on her.  She denies any particular leg weakness, though she does report that she has paresthesias that go into her left thigh.  She denies urinary or fecal incontinence or retention, denies saddle anesthesia.  She is still able to ambulate.  She has not had any fevers or chills.  She took her hydrocodone this morning without significant improvement of her symptoms.  She denies abdominal pain, chest pain, or shortness of breath.  No fevers or chills.  No history of IV drug use.  Patient Active Problem List   Diagnosis Date Noted   Trochanteric bursitis of right hip 04/05/2022   Acquired trigger finger of left ring finger 03/17/2022   Stiffness of right knee 02/03/2022   Pain in joint of right knee 01/24/2022   S/P TKR (total knee replacement) using cement, right 01/10/2022   Vulvar rash 12/07/2021   LVH (left ventricular hypertrophy) 10/24/2021   Aortic regurgitation 40/05/2724   Systolic murmur 36/64/4034   Thrush of mouth and esophagus ([place]) 10/01/2021   Skin yeast infection 10/01/2021   Vaginal yeast infection 10/01/2021   Current chronic use of inhaled steroid 10/01/2021   Chronic pain syndrome 10/01/2021   Mixed hyperlipidemia 10/01/2021   Nodule of left lung 09/20/2021   Hyperlipidemia associated with type 2 diabetes mellitus ([place]) 09/02/2021   Type 2 diabetes mellitus with hyperglycemia, without  long-term current use of insulin ([place]) 09/02/2021   Hypertension associated with type 2 diabetes mellitus ([place]) 09/02/2021   Vaginal cyst 09/02/2021   COPD with exacerbation ([place]) 09/02/2021   Osteoarthritis of left knee 06/08/2021   History of total knee arthroplasty 05/17/2021   S/P total knee replacement, left 05/10/2021   Healthcare maintenance 05/03/2021   Contusion of coccyx 03/15/2021   Osteoarthritis of knees, bilateral 04/22/2020   Chronic obstructive pulmonary disease with acute exacerbation ([place]) 12/31/2019   Cough 12/31/2019   Biliary dyskinesia    Diverticulitis of large intestine with abscess 05/27/2018   Diabetes mellitus without complication ([place]) 74/25/9563   Lateral epicondylitis 07/07/2017   Personal history of tobacco use, presenting hazards to health 05/03/2016   Bronchitis, chronic ([place]) 03/03/2015   Anxiety, generalized 11/05/2014   Alcohol abuse, in remission 11/05/2014   Nicotine addiction 11/05/2014   H/O: osteoarthritis 11/05/2014   Fibromyalgia 11/05/2014   Restless leg syndrome 11/05/2014   Arthralgia of hip 01/13/2014   Adiposity 01/13/2014   Disordered sleep 01/13/2014   Airway hyperreactivity 12/17/2013   Adult hypothyroidism 12/17/2013   Current tobacco use 12/17/2013   Basal cell carcinoma 11/04/2013   Arthropathia 02/08/2012   Arthritis of knee, left 02/08/2012          Physical Exam   Triage Vital Signs: ED Triage Vitals [06/23/22 0917]  Enc Vitals Group     BP (!) 186/67     Pulse Rate 74  Resp 16     Temp 98.2 F (36.8 C)     Temp Source Oral     SpO2 93 %     Weight      Height      Head Circumference      Peak Flow      Pain Score      Pain Loc      Pain Edu?      Excl. in [place]?     Most recent vital signs: Vitals:   06/23/22 0917  BP: (!) 186/67  Pulse: 74  Resp: 16  Temp: 98.2 F (36.8 C)  SpO2: 93%    Physical Exam Vitals and nursing note reviewed.  Constitutional:      General: Awake and alert. No  acute distress.    Appearance: Normal appearance. The patient is obese.  HENT:     Head: Normocephalic and atraumatic.     Mouth: Mucous membranes are moist.  Eyes:     General: PERRL. Normal EOMs        Right eye: No discharge.        Left eye: No discharge.     Conjunctiva/sclera: Conjunctivae normal.  Cardiovascular:     Rate and Rhythm: Normal rate and regular rhythm.     Pulses: Normal pulses.  Pulmonary:     Effort: Pulmonary effort is normal. No respiratory distress.     Breath sounds: Normal breath sounds.  Abdominal:     Abdomen is soft. There is no abdominal tenderness. No rebound or guarding. No distention. Back: No midline tenderness.  Mild bilateral lumbar paraspinal muscle tenderness.  Strength and sensation 5/5 to bilateral lower extremities. Normal great toe extension against resistance. Normal sensation throughout feet. Normal patellar reflexes.  Mild discomfort with SLR on the left and negative opposite SLR bilaterally. Negative FABER test Musculoskeletal:        General: No swelling. Normal range of motion.     Cervical back: Normal range of motion and neck supple.  Skin:    General: Skin is warm and dry.     Capillary Refill: Capillary refill takes less than 2 seconds.     Findings: No rash.  Neurological:     Mental Status: The patient is awake and alert.      ED Results / Procedures / Treatments   Labs (all labs ordered are listed, but only abnormal results are displayed) Labs Reviewed  BASIC METABOLIC PANEL - Abnormal; Notable for the following components:      Result Value   Sodium 130 (*)    Chloride 96 (*)    Glucose, Bld 225 (*)    All other components within normal limits  CBG MONITORING, ED - Abnormal; Notable for the following components:   Glucose-Capillary 234 (*)    All other components within normal limits  CBC WITH DIFFERENTIAL/PLATELET     EKG     RADIOLOGY I independently reviewed and interpreted imaging and agree with  radiologists findings.     PROCEDURES:  Critical Care performed:   Procedures   MEDICATIONS ORDERED IN ED: Medications  lidocaine (LIDODERM) 5 % 1 patch (1 patch Transdermal Patch Applied 06/23/22 0951)  ketorolac (TORADOL) 15 MG/ML injection 15 mg (15 mg Intravenous Given 06/23/22 1004)     IMPRESSION / MDM / ASSESSMENT AND PLAN / ED COURSE  I reviewed the triage vital signs and the nursing notes.   Differential diagnosis includes, but is not limited to, spasm, radiculopathy, less likely cord  compression, cauda equina, epidural abscess or epidural hematoma.  This is a 66 year old female with a history of back pain who presents with back pain. 5 out of 5 strength with intact sensation to extensor hallucis dorsiflexion and plantarflexion of bilateral lower extremities with normal patellar reflexes bilaterally. Most likely etiology at this point is muscle strain vs herniated disc. No red flags to indicate patient is at risk for more auspicious process that would require urgent/emergent subspecialty evaluation at this time. No major trauma, no midline tenderness, no history or physical exam findings to suggest cauda equina syndrome or spinal cord compression. No focal neurological deficits on exam. No constitutional symptoms or history of immunosuppression or IVDA to suggest potential for epidural abscess. Not anticoagulated, no history of bleeding diastasis to suggest risk for epidural hematoma. No chronic steroid use or advanced age or history of malignancy to suggest proclivity towards pathological fracture.  No abdominal pain or flank pain to suggest kidney stone, no history of kidney stone.  No fever or dysuria or CVAT to suggest pyelonephritis .  No chest pain, back pain, shortness of breath, neurological deficits, to suggest vascular catastrophe, and pulses are equal in all 4 extremities.   Patient describes radicular symptoms to her thigh of her left leg.  She has a positive straight  leg rise on that side.  CT scan of her lumbar spine reveals stenosis of the lateral recesses at the left L3, and right L4 and L5 nerve levels.  She has full strength and sensation in her bilateral lower extremities, no urinary or fecal incontinence or retention or saddle anesthesia to suggest cord compression.  She is able to ambulate.  She was treated symptomatically with Toradol and Lidoderm patch with improvement of her symptoms.  She was started on naproxen and Lidoderm patches to take at home.  She was instructed to follow-up with neurosurgery and the appropriate information was provided.  Discussed care instructions and return precautions with patient. Recommended close outpatient follow-up for re-evaluation. Patient agrees with plan of care. Will treat the patient symptomatically as needed for pain control. Will discharge patient to take these medications and return for any worsening or different pain or development of any neurologic symptoms. Educated patient regarding expected time course for back pain to improve and recommended very close outpatient follow-up.  Patient understands and agrees with plan.  She was discharged in stable condition.   Patient's presentation is most consistent with acute complicated illness / injury requiring diagnostic workup.    Clinical Course as of 06/23/22 1359  Thu Jun 23, 2022  1046 Patient reports that she feels improved.  She is calling her feeling member for a ride home [JP]    Clinical Course User Index [JP] Seeley Southgate, Clarnce Flock, PA-C     FINAL CLINICAL IMPRESSION(S) / ED DIAGNOSES   Final diagnoses:  Lumbar radiculopathy     Rx / DC Orders   ED Discharge Orders          Ordered    naproxen (NAPROSYN) 500 MG tablet  2 times daily with meals        06/23/22 1045    lidocaine (LIDODERM) 5 %  Every 12 hours        06/23/22 1045             Note:  This document was prepared using Dragon voice recognition software and may include  unintentional dictation errors.   Emeline Gins 06/23/22 1359    Vanessa Morrow, MD  06/23/22 1502  

## 2022-06-23 NOTE — ED Notes (Signed)
Patient transported to CT 

## 2022-06-23 NOTE — ED Triage Notes (Signed)
Per EMT report, patient had 3 falls yesterday, "my legs gave out." Patient was seen by EMTs yesterday 3 times, but declined transport. Patient states she has been seen for arthritis in her back and is waiting for MRI. Patient states her "legs feel like jelly." Per EMT report, no injuries were reported from the fall other than back pain. No LOC and is not on blood thinners. Patient has a history of HTN and diabetes. Patient did take one Vicodin this morning, but denies any relief.

## 2022-06-23 NOTE — ED Provider Notes (Signed)
[facility] Provider Note    Event Date/Time   First MD Initiated Contact with Patient 06/23/22 1814     (approximate)   History   Back Pain, Abdominal Pain, and Nausea   HPI  Linda Bradford is a 66 y.o. female   with a history of type 2 diabetes, hypertension chronic low back pain and fibromyalgia alcohol abuse  Patient reports for about 2 to 3 days she has had notable lower back pain, but also developing pain between her shoulder blades and in her upper abdomen 2.  She feels a pain that radiates from her back to these areas.  She reports she has not taken her blood pressure medicine.  She is an active smoker.  She denies that she is having "chest pain" but rather reports is more like pain around her shoulder blades in her lower back and now radiating towards her middle abdomen  Some nausea but no fevers no headache no numbness tingling or weakness.  Still able to walk.  Seen earlier today reports ongoing pain seems to be worsening in particular with regard to pain in her back now      Physical Exam   Triage Vital Signs: ED Triage Vitals  Enc Vitals Group     BP 06/23/22 1804 (!) 197/84     Pulse Rate 06/23/22 1804 83     Resp 06/23/22 1804 17     Temp 06/23/22 1804 98.3 F (36.8 C)     Temp Source 06/23/22 1804 Oral     SpO2 06/23/22 1802 95 %     Weight 06/23/22 1804 220 lb (99.8 kg)     Height 06/23/22 1804 '5\' 8"'$  (1.727 m)     Head Circumference --      Peak Flow --      Pain Score 06/23/22 1804 10     Pain Loc --      Pain Edu? --      Excl. in [place]? --     Most recent vital signs: Vitals:   06/23/22 1804 06/23/22 2000  BP: (!) 197/84 (!) 202/74  Pulse: 83 77  Resp: 17 20  Temp: 98.3 F (36.8 C)   SpO2: 93% 94%     General: Awake, no distress.  He does appear in some discomfort.  Able to sit up on her own, demonstrate to me the areas that are hurting.  In no acute distress though CV:  Good peripheral perfusion.  Truong palpable  dorsalis pedis and posterior tibial pulses in the lower extremities bilaterally.  Warm well-perfused in all 4 extremities Resp:  Normal effort.  Clear bilateral Abd:  No distention.  Obese but soft nontender nondistended.  Patient notes that palpation of the abdomen does not reproduce the pain she is experiencing in the mid abdomen Other:  Moves all extremities well.  Demonstrates good use of the lower extremities.  No neurologic deficits to noted   ED Results / Procedures / Treatments   Labs (all labs ordered are listed, but only abnormal results are displayed) Labs Reviewed  COMPREHENSIVE METABOLIC PANEL - Abnormal; Notable for the following components:      Result Value   Sodium 133 (*)    Chloride 96 (*)    Glucose, Bld 185 (*)    All other components within normal limits  LIPASE, BLOOD  LACTIC ACID, PLASMA  CBC  TYPE AND SCREEN  TYPE AND SCREEN  TROPONIN I (HIGH SENSITIVITY)     EKG  Interpreted by me at 2000 heart rate 80 QRS 80 QTc 460 Normal sinus rhythm, baseline artifact.  No evidence of acute ischemia or ectopy.  Mild nonspecific T wave abnormality possibly artifactual   RADIOLOGY  I personally interpreted the patient's CT angiogram for acute gross pathology, I do not see evidence of acute aneurysm or dissection.  CT Angio Chest/Abd/Pel for Dissection W and/or Wo Contrast  Result Date: 06/23/2022 CLINICAL DATA:  Acute aortic syndrome suspected. Hypertension with chest and abdominal pain. EXAM: CT ANGIOGRAPHY CHEST, ABDOMEN AND PELVIS TECHNIQUE: Non-contrast CT of the chest was initially obtained. Multidetector CT imaging through the chest, abdomen and pelvis was performed using the standard protocol during bolus administration of intravenous contrast. Multiplanar reconstructed images and MIPs were obtained and reviewed to evaluate the vascular anatomy. RADIATION DOSE REDUCTION: This exam was performed according to the departmental dose-optimization program which  includes automated exposure control, adjustment of the mA and/or kV according to patient size and/or use of iterative reconstruction technique. CONTRAST:  123m OMNIPAQUE IOHEXOL 350 MG/ML SOLN COMPARISON:  Chest CT 12/20/2021 FINDINGS: CTA CHEST FINDINGS Cardiovascular: The heart is normal in size. No pericardial effusion. Normal caliber thoracic aorta without dissection. Scattered atherosclerotic calcifications. Scattered coronary artery calcifications. The pulmonary arteries are grossly normal. Mediastinum/Nodes: No mediastinal or hilar mass or adenopathy or hematoma. The esophagus is unremarkable. The thyroid gland is normal. Lungs/Pleura: Stable mild emphysematous changes and pulmonary scarring. No acute pulmonary process. No worrisome pulmonary lesions or pulmonary nodules. Streaky subsegmental atelectasis at both lung bases. Musculoskeletal: No breast masses, supraclavicular or axillary adenopathy. The bony thorax is intact. Review of the MIP images confirms the above findings. CTA ABDOMEN AND PELVIS FINDINGS VASCULAR Aorta: Scattered atherosclerotic calcifications but no aneurysm or dissection. Celiac: Mild narrowing at the origin but no significant stenosis. No aneurysm or dissection. SMA: Minimal ostial calcifications but no stenosis. Renals: Ostial calcifications but no significant stenosis. IMA: Patent Inflow: Minimal atherosclerotic calcifications. No aneurysm dissection. Veins: No significant findings. Review of the MIP images confirms the above findings. NON-VASCULAR Hepatobiliary: No hepatic lesions or intrahepatic biliary dilatation. The gallbladder is surgically absent. No common bile duct dilatation. Pancreas: Normal Spleen: Calcified granuloma but no worrisome lesions. Adrenals/Urinary Tract: The adrenal glands and kidneys are. The bladder is unremarkable. Mild cystocele is noted. Stomach/Bowel: The stomach, duodenum, small and colon unremarkable. No acute inflammatory process, mass lesions or  obstructive findings. Advanced descending colon diverticulosis without findings for acute diverticulitis. Remote surgical changes involving the mid sigmoid colon. Lymphatic: No mesenteric or retroperitoneal mass or adenopathy. Reproductive: The uterus and ovaries are unremarkable. Other: No pelvic mass or adenopathy. No free pelvic fluid collections. No inguinal mass or adenopathy. No abdominal wall hernia or subcutaneous lesions. Musculoskeletal: No significant bony findings. Review of the MIP images confirms the above findings. IMPRESSION: 1. Moderate atherosclerotic calcifications involving the abdominal aorta but no aneurysm or dissection. Minimal atherosclerotic calcifications involving the thoracic aorta. 2. No acute pulmonary process. 3. Stable mild emphysematous changes and pulmonary scarring. 4. Status post cholecystectomy without biliary dilatation. 5. Advanced descending colon diverticulosis without findings for acute diverticulitis. 6. Mild cystocele. 7. Aortic atherosclerosis. Aortic Atherosclerosis (ICD10-I70.0) and Emphysema (ICD10-J43.9). Electronically Signed   By: PMarijo SanesM.D.   On: 06/23/2022 19:55   PROCEDURES:  Critical Care performed: No  Procedures   MEDICATIONS ORDERED IN ED: Medications  morphine (PF) 4 MG/ML injection 4 mg (4 mg Intravenous Given 06/23/22 1856)  metoprolol tartrate (LOPRESSOR) injection 5 mg (5 mg Intravenous Given 06/23/22  2005)  ondansetron [facility]) injection 4 mg (4 mg Intravenous Given 06/23/22 1856)  iohexol (OMNIPAQUE) 350 MG/ML injection 100 mL (100 mLs Intravenous Contrast Given 06/23/22 1935)     IMPRESSION / MDM / ASSESSMENT AND PLAN / ED COURSE  I reviewed the triage vital signs and the nursing notes.                              Differential diagnosis includes, but is not limited to, possible acute aortic syndrome, dissection, hypertensive urgency or emergency, less likely felt to be ACS or pulmonary embolism given the patient denies  active chest pain but rather reports of pain in her shoulder blade radiating out towards her epigastrium and lower back.  Vascular exam lower extremities reassuring.  No neurologic deficits.  She does report chronic back pain as well, but in this particular instance with pain in her shoulders and epigastrium and her severe hypertension I am quite concerned.  She does reportedly take metoprolol but has not taken it recently.  Will provide IV metoprolol for her blood pressure lowering as we continue her emergent workup  Patient's presentation is most consistent with acute presentation with potential threat to life or bodily function.  The patient is on the cardiac monitor to evaluate for evidence of arrhythmia and/or significant heart rate changes.  ----------------------------------------- 8:14 PM on 06/23/2022 ----------------------------------------- Workup to this point somewhat reassuring.  CBC normal normal initial troponin and lactic acid.  Metabolic panel with mild hyponatremia  Of notable concern though is the patient's complaint now shoulder blade and thoracic pain along with her associated lower abdominal pain and epigastric discomfort now with severe hypertension.  She is known to be hypertensive but has not and antihypertensive due to not taking her medication.  I have ordered a dose of IV metoprolol we will monitor closely given her presentation with associated back pain and severe hypertension I anticipate she will require admission for at minimum treatment of hypertensive urgency with associated potentially vascular symptoms at this point no evidence of clear ischemia or dissection  Ongoing care assigned to Mardee Postin, PA with plan to follow-up on response to antihypertensive treatment metoprolol which has been ordered and ongoing hypertension and symptomatic management.  Anticipate admission to the hospital thereafter     FINAL CLINICAL IMPRESSION(S) / ED DIAGNOSES   Final  diagnoses:  Acute midline thoracic back pain  Lumbar pain  Hypertensive urgency  Malignant hypertension     Rx / DC Orders   ED Discharge Orders     None        Note:  This document was prepared using Dragon voice recognition software and may include unintentional dictation errors.   Delman Kitten, MD 06/23/22 2016

## 2022-06-23 NOTE — H&P (Signed)
History and Physical    Linda Bradford LKT:[id number] DOB: 1956-03-17 DOA: 06/23/2022  PCP: [name], FNP   Patient coming from: Home   Chief Complaint: Back pain   HPI: Linda Bradford is a 66 y.o. female with medical history significant for hypertension, type 2 diabetes mellitus, COPD, hypothyroidism, and chronic back pain who presents to the emergency department with back pain.  Patient reports that she was doing a lot more physical activity than usual recently while working in her garage, and went on to develop severe worsening in her chronic back pain.  She has also had some numbness in the lower extremities associated with this but no weakness, change in bowel or bladder habits, or fevers.  She was initially seen in the ED with acute on chronic low back pain, improved with Toradol and Lidoderm, head CT with only mild spinal stenosis suspected, and was discharged home.  She now returns with ongoing low back pain, and also more pain in her upper back.  She is following with orthopedic surgery for this and is planned for MRI once insurance authorizes this.  She acknowledges missing a few doses of her metoprolol, including today.  She denies chest pain.  ED Course: Upon arrival to the ED, patient is found to be afebrile and saturating mid 90s on room air with systolic blood pressure as high as 207.  EKG features sinus rhythm and CTA chest/abdomen/pelvis is negative for acute findings.  Troponin was normal.    Patient was treated with 2 doses of morphine, Zofran, IV Lopressor, and IV hydralazine in the ED.  Review of Systems:  All other systems reviewed and apart from HPI, are negative.  Past Medical History:  Diagnosis Date   Anemia    Anxiety    Arthritis    Cancer ([place])    skin cancer, removed   COPD (chronic obstructive pulmonary disease) ([place])    Depression    Diabetes mellitus without complication ([place])    Dyspnea    Dysrhythmia    occasional pvc's. not being treated.    Fatigue    GERD (gastroesophageal reflux disease)    Headache    Heart murmur    Hypertension    Hypothyroidism    Pneumonia    Thyroid disease    Tremors of nervous system     Past Surgical History:  Procedure Laterality Date   APPENDECTOMY     CHOLECYSTECTOMY N/A 07/01/2019   Procedure: LAPAROSCOPIC CHOLECYSTECTOMY;  Surgeon: Olean Ree, MD;  Location: [facility] OR[facility];  Service: General;  Laterality: N/A;   COLECTOMY WITH COLOSTOMY CREATION/HARTMANN PROCEDURE N/A 09/03/2018   Error, this was not done.   COLONOSCOPY WITH PROPOFOL N/A 03/08/2019   Procedure: COLONOSCOPY WITH PROPOFOL;  Surgeon: Virgel Manifold, MD;  Location: [facility];  Service: Endoscopy;  Laterality: N/A;   CYSTOSCOPY WITH STENT PLACEMENT Bilateral 09/03/2018   Procedure: CYSTOSCOPY WITH STENT PLACEMENT-LIGHTED STENTS;  Surgeon: Hollice Espy, MD;  Location: [facility];  Service: Urology;  Laterality: Bilateral;   LAPAROSCOPIC SIGMOID COLECTOMY N/A 09/03/2018   Procedure: LAPAROSCOPIC SIGMOID COLECTOMY;  Surgeon: Olean Ree, MD;  Location: [facility];  Service: General;  Laterality: N/A;   percutaneous drainage tube  07/2018   2nd tube placed. not healing in colon, causing a fistula   TONSILLECTOMY     TOTAL KNEE ARTHROPLASTY Left 05/10/2021   Procedure: TOTAL KNEE ARTHROPLASTY;  Surgeon: Lovell Sheehan, MD;  Location: [facility];  Service: Orthopedics;  Laterality: Left;   TOTAL KNEE ARTHROPLASTY  Right 01/10/2022   Procedure: TOTAL KNEE ARTHROPLASTY;  Surgeon: Lovell Sheehan, MD;  Location: [facility];  Service: Orthopedics;  Laterality: Right;   TUBAL LIGATION      Social History:   reports that she has been smoking cigarettes. She started smoking about 42 years ago. She has a 90.00 pack-year smoking history. She has never used smokeless tobacco. She reports that she does not drink alcohol and does not use drugs.  Allergies  Allergen Reactions   Dexilant [Dexlansoprazole] Nausea Only and Other (See Comments)     Dizziness    Family History  Problem Relation Age of Onset   Lung cancer Mother    Alcohol abuse Sister    Arthritis Sister    Alcohol abuse Brother    Heart disease Brother 23   Heart attack Brother    Pancreatic cancer Maternal Aunt    Diabetes Maternal Aunt    Alcohol abuse Son    Drug abuse Son    Drug abuse Son    Heart disease Father 40   Diabetes Sister    Breast cancer Brother    Alcohol abuse Maternal Uncle    Alcohol abuse Maternal Grandmother    Diabetes Maternal Grandmother      Prior to Admission medications   Medication Sig Start Date End Date Taking? Authorizing Provider  acetaminophen (TYLENOL) 500 MG tablet Take 1,000 mg by mouth every 6 (six) hours as needed for mild pain or moderate pain.     [provider]  albuterol (VENTOLIN HFA) 108 (90 Base) MCG/ACT inhaler INHALE 2 PUFFS INTO THE LUNGS EVERY 4 HOURS AS NEEDED FOR WHEEZING OR SHORTNESS OF BREATH 11/23/21   [name], FNP  aspirin 81 MG chewable tablet Chew 1 tablet (81 mg total) by mouth 2 (two) times daily. 01/13/22   Carlynn Spry, PA-C  benzonatate (TESSALON) 200 MG capsule TAKE 1 CAPSULE BY MOUTH 3 TIMES DAILY ASNEEDED 08/24/20   Mar Daring, PA-C  buPROPion (WELLBUTRIN XL) 150 MG 24 hr tablet TAKE 1 TABLET BY MOUTH ONCE DAILY. *MAKEAPPOINTMENT FOR FURTHER FILLS* 05/25/22   Tally Joe T, FNP  Cholecalciferol (VITAMIN D) 50 MCG (2000 UT) tablet Take 2,000 Units by mouth daily.    [provider]  clonazePAM (KLONOPIN) 0.5 MG tablet     [provider]  diclofenac (VOLTAREN) 50 MG EC tablet     [provider]  diclofenac Sodium (VOLTAREN) 1 % GEL     [provider]  DULoxetine (CYMBALTA) 60 MG capsule TAKE 1 CAPSULE BY MOUTH TWICE DAILY 06/16/22   [name], FNP  ezetimibe (ZETIA) 10 MG tablet     [provider]  fluconazole (DIFLUCAN) 150 MG tablet Take 1 tablet by mouth, if symptoms still remain after 3 days- take  additional tablet by mouth. 05/24/22   Simmons-Robinson, Riki Sheer, MD  fluticasone (FLONASE) 50 MCG/ACT nasal spray USE 2 SPRAYS IN [facility] NOSTRIL ONCE DAILY 01/18/22   [name], FNP  Fluticasone-Umeclidin-Vilant (TRELEGY ELLIPTA) 200-62.5-25 MCG/ACT AEPB Inhale 1 puff into the lungs 2 (two) times daily. 09/02/21   [name], FNP  gabapentin (NEURONTIN) 300 MG capsule TAKE 3 CAPSULES BY MOUTH ONCE EVERY MORNING AND 5 CAPSULES AT BEDTIME 01/18/22   Tally Joe T, FNP  glucose blood (CONTOUR NEXT TEST) test strip To check blood sugar once daily 06/09/17   Mar Daring, PA-C  hydrOXYzine (ATARAX) 10 MG tablet Take 1 tablet (10 mg total) by mouth 3 (three)  times daily as needed. 09/02/21   [name], FNP  ibuprofen (ADVIL) 600 MG tablet     [provider]  ipratropium-albuterol (DUONEB) 0.5-2.5 (3) MG/3ML SOLN Take 3 mLs by nebulization every 6 (six) hours as needed. 12/07/21   [name], PA-C  levothyroxine (SYNTHROID) 75 MCG tablet Take 1 tablet (75 mcg total) by mouth daily before breakfast. 09/03/21   [name], FNP  lidocaine (LIDODERM) 5 % Place 1 patch onto the skin every 12 (twelve) hours for 5 days. Remove & Discard patch within 12 hours or as directed by MD 06/23/22 06/28/22  Poggi, Eliezer Lofts E, PA-C  loperamide (IMODIUM A-D) 2 MG tablet Take 2-4 mg by mouth 4 (four) times daily as needed for diarrhea or loose stools.    [provider]  meloxicam (MOBIC) 15 MG tablet Take 1 tablet (15 mg total) by mouth daily. 09/15/20   Mar Daring, PA-C  meloxicam (MOBIC) 7.5 MG tablet Take 1 tablet by mouth 2 (two) times daily with a meal.    [provider]  metaxalone (SKELAXIN) 800 MG tablet TAKE 1 TABLET BY MOUTH 3 TIMES DAILY FORMUSCLE SPASMS 04/15/22   Simmons-Robinson, [place], MD  metFORMIN (GLUCOPHAGE-XR) 500 MG 24 hr tablet TAKE 2 TABLETS BY MOUTH ONCE A DAY WITH SUPPER. Please schedule office visit before any future refill. 03/21/22   [name], FNP  methocarbamol (ROBAXIN) 500 MG tablet     [provider]  metoprolol succinate (TOPROL-XL) 50 MG 24 hr tablet Take 1 tablet (50 mg total) by mouth daily. Please schedule office visit before any future refill. 03/21/22   [name], FNP  metoprolol tartrate (LOPRESSOR) 25 MG tablet     [provider]  naproxen (NAPROSYN) 500 MG tablet Take 1 tablet (500 mg total) by mouth 2 (two) times daily with a meal for 7 days. 06/23/22 06/30/22  Poggi, Clarnce Flock, PA-C  nicotine (NICODERM CQ - DOSED IN MG/24 HOURS) 14 mg/24hr patch Place 1 patch (14 mg total) onto the skin daily for 14 days. 06/14/22 06/28/22  Armando Reichert, MD  nicotine (NICODERM CQ - DOSED IN MG/24 HOURS) 21 mg/24hr patch Place 1 patch (21 mg total) onto the skin daily. 06/14/22 07/26/22  Armando Reichert, MD  nicotine (NICODERM CQ - DOSED IN MG/24 HR) 7 mg/24hr patch Place 1 patch (7 mg total) onto the skin daily for 14 days. 06/14/22 06/28/22  Armando Reichert, MD  nicotine polacrilex (NICOTINE MINI) 2 MG lozenge Take 1 lozenge (2 mg total) by mouth every 2 (two) hours as needed for smoking cessation. 06/14/22 09/12/22  Armando Reichert, MD  nitrofurantoin, macrocrystal-monohydrate, (MACROBID) 100 MG capsule     [provider]  nystatin (MYCOSTATIN) 100000 UNIT/ML suspension Take 5 mLs (500,000 Units total) by mouth 4 (four) times daily. 10/01/21   [name], FNP  nystatin (MYCOSTATIN/NYSTOP) powder Apply 1 application topically 3 (three) times daily. 10/01/21   [name], FNP  oxybutynin (DITROPAN-XL) 5 MG 24 hr tablet Take 1 tablet (5 mg total) by mouth at bedtime. 03/29/22   Emily Filbert, MD  primidone (MYSOLINE) 50 MG tablet Take 50 mg by mouth 2 (two) times daily. 02/15/21   [provider]  promethazine-dextromethorphan (PROMETHAZINE-DM) 6.25-15 MG/5ML syrup     [provider]  rosuvastatin (CRESTOR) 10 MG tablet Take 1 tablet (10 mg total) by mouth daily. 12/08/21   [name],  PA-C  rosuvastatin (CRESTOR) 5 MG tablet  [provider]  Spacer/Aero-Holding Chambers (AEROCHAMBER PLUS WITH MASK) inhaler  08/11/21   [provider]  theophylline (THEO-24) 300 MG 24 hr capsule Take 1 capsule (300 mg total) by mouth daily. 12/07/21   [name], PA-C  traMADol (ULTRAM) 50 MG tablet Take 1 tablet by mouth every 4 (four) hours as needed.    [provider]  traZODone (DESYREL) 100 MG tablet Take 2 tablets (200 mg total) by mouth at bedtime as needed for sleep. 04/29/21   [name], FNP  traZODone (DESYREL) 50 MG tablet     [provider]  triamcinolone ointment (KENALOG) 0.5 % Apply 1 application. topically 2 (two) times daily. 12/23/21   April Manson, MD  varenicline (CHANTIX) 0.5 MG tablet     [provider]  vitamin B-12 (CYANOCOBALAMIN) 1000 MCG tablet Take 1,000 mcg by mouth daily.    [provider]    Physical Exam: Vitals:   06/23/22 2115 06/23/22 2149 06/23/22 2150 06/23/22 2200  BP:  (!) 211/60 (!) 211/60 (!) 178/64  Pulse: 72 65  67  Resp: '20 14  13  '$ Temp:      TempSrc:      SpO2:      Weight:      Height:        Constitutional: NAD, no pallor or diaphoresis   Eyes: PERTLA, lids and conjunctivae normal ENMT: Mucous membranes are moist. Posterior pharynx clear of any exudate or lesions.   Neck: supple, no masses  Respiratory: no wheezing, no crackles. No accessory muscle use.  Cardiovascular: S1 & S2 heard, regular rate and rhythm. No extremity edema.   Abdomen: No distension, no tenderness, soft. Bowel sounds active.  Musculoskeletal: no clubbing / cyanosis. No joint deformity upper and lower extremities.   Skin: no significant rashes, lesions, ulcers. Warm, dry, well-perfused. Neurologic: CN 2-12 grossly intact. Strength 5/5 in all 4 limbs. Alert and oriented.  Psychiatric: Calm. Cooperative.    Labs and Imaging on Admission: I have personally reviewed following labs and imaging  studies  CBC: Recent Labs  Lab 06/23/22 1006 06/23/22 1827  WBC 5.6 5.1  NEUTROABS 3.7  --   HGB 14.6 15.0  HCT 43.7 44.1  MCV 90.1 89.1  PLT 241 295   Basic Metabolic Panel: Recent Labs  Lab 06/23/22 1006 06/23/22 1827  NA 130* 133*  K 4.5 4.1  CL 96* 96*  CO2 26 27  GLUCOSE 225* 185*  BUN 12 11  CREATININE 0.62 0.51  CALCIUM 9.6 9.2   GFR: Estimated Creatinine Clearance: 85.5 mL/min (by C-G formula based on SCr of 0.51 mg/dL). Liver Function Tests: Recent Labs  Lab 06/23/22 1827  AST 21  ALT 19  ALKPHOS 91  BILITOT 1.0  PROT 7.8  ALBUMIN 4.1   Recent Labs  Lab 06/23/22 1827  LIPASE 28   No results for input(s): "AMMONIA" in the last 168 hours. Coagulation Profile: No results for input(s): "INR", "PROTIME" in the last 168 hours. Cardiac Enzymes: No results for input(s): "CKTOTAL", "CKMB", "CKMBINDEX", "TROPONINI" in the last 168 hours. BNP (last 3 results) No results for input(s): "PROBNP" in the last 8760 hours. HbA1C: No results for input(s): "HGBA1C" in the last 72 hours. CBG: Recent Labs  Lab 06/23/22 0922 06/23/22 2217  GLUCAP 234* 168*   Lipid Profile: No results for input(s): "CHOL", "HDL", "LDLCALC", "TRIG", "CHOLHDL", "LDLDIRECT" in the last 72 hours. Thyroid Function Tests: No results for input(s): "TSH", "T4TOTAL", "FREET4", "T3FREE", "THYROIDAB" in the  last 72 hours. Anemia Panel: No results for input(s): "VITAMINB12", "FOLATE", "FERRITIN", "TIBC", "IRON", "RETICCTPCT" in the last 72 hours. Urine analysis:    Component Value Date/Time   COLORURINE STRAW (A) 12/24/2021 1524   APPEARANCEUR CLEAR (A) 12/24/2021 1524   APPEARANCEUR Clear 05/20/2019 1453   LABSPEC 1.003 (L) 12/24/2021 1524   PHURINE 6.0 12/24/2021 1524   GLUCOSEU NEGATIVE 12/24/2021 1524   HGBUR NEGATIVE 12/24/2021 [place] 12/24/2021 1524   BILIRUBINUR Negative 12/02/2019 1421   BILIRUBINUR Negative 05/20/2019 [place]  12/24/2021 1524   PROTEINUR NEGATIVE 12/24/2021 1524   UROBILINOGEN 0.2 12/02/2019 1421   NITRITE NEGATIVE 12/24/2021 1524   LEUKOCYTESUR NEGATIVE 12/24/2021 1524   Sepsis Labs: '@LABRCNTIP'$ (procalcitonin:4,lacticidven:4) )No results found for this or any previous visit (from the past 240 hour(s)).   Radiological Exams on Admission: CT Angio Chest/Abd/Pel for Dissection W and/or Wo Contrast  Result Date: 06/23/2022 CLINICAL DATA:  Acute aortic syndrome suspected. Hypertension with chest and abdominal pain. EXAM: CT ANGIOGRAPHY CHEST, ABDOMEN AND PELVIS TECHNIQUE: Non-contrast CT of the chest was initially obtained. Multidetector CT imaging through the chest, abdomen and pelvis was performed using the standard protocol during bolus administration of intravenous contrast. Multiplanar reconstructed images and MIPs were obtained and reviewed to evaluate the vascular anatomy. RADIATION DOSE REDUCTION: This exam was performed according to the departmental dose-optimization program which includes automated exposure control, adjustment of the mA and/or kV according to patient size and/or use of iterative reconstruction technique. CONTRAST:  175m OMNIPAQUE IOHEXOL 350 MG/ML SOLN COMPARISON:  Chest CT 12/20/2021 FINDINGS: CTA CHEST FINDINGS Cardiovascular: The heart is normal in size. No pericardial effusion. Normal caliber thoracic aorta without dissection. Scattered atherosclerotic calcifications. Scattered coronary artery calcifications. The pulmonary arteries are grossly normal. Mediastinum/Nodes: No mediastinal or hilar mass or adenopathy or hematoma. The esophagus is unremarkable. The thyroid gland is normal. Lungs/Pleura: Stable mild emphysematous changes and pulmonary scarring. No acute pulmonary process. No worrisome pulmonary lesions or pulmonary nodules. Streaky subsegmental atelectasis at both lung bases. Musculoskeletal: No breast masses, supraclavicular or axillary adenopathy. The bony thorax is  intact. Review of the MIP images confirms the above findings. CTA ABDOMEN AND PELVIS FINDINGS VASCULAR Aorta: Scattered atherosclerotic calcifications but no aneurysm or dissection. Celiac: Mild narrowing at the origin but no significant stenosis. No aneurysm or dissection. SMA: Minimal ostial calcifications but no stenosis. Renals: Ostial calcifications but no significant stenosis. IMA: Patent Inflow: Minimal atherosclerotic calcifications. No aneurysm dissection. Veins: No significant findings. Review of the MIP images confirms the above findings. NON-VASCULAR Hepatobiliary: No hepatic lesions or intrahepatic biliary dilatation. The gallbladder is surgically absent. No common bile duct dilatation. Pancreas: Normal Spleen: Calcified granuloma but no worrisome lesions. Adrenals/Urinary Tract: The adrenal glands and kidneys are. The bladder is unremarkable. Mild cystocele is noted. Stomach/Bowel: The stomach, duodenum, small and colon unremarkable. No acute inflammatory process, mass lesions or obstructive findings. Advanced descending colon diverticulosis without findings for acute diverticulitis. Remote surgical changes involving the mid sigmoid colon. Lymphatic: No mesenteric or retroperitoneal mass or adenopathy. Reproductive: The uterus and ovaries are unremarkable. Other: No pelvic mass or adenopathy. No free pelvic fluid collections. No inguinal mass or adenopathy. No abdominal wall hernia or subcutaneous lesions. Musculoskeletal: No significant bony findings. Review of the MIP images confirms the above findings. IMPRESSION: 1. Moderate atherosclerotic calcifications involving the abdominal aorta but no aneurysm or dissection. Minimal atherosclerotic calcifications involving the thoracic aorta. 2. No acute pulmonary process. 3. Stable mild emphysematous changes and pulmonary  scarring. 4. Status post cholecystectomy without biliary dilatation. 5. Advanced descending colon diverticulosis without findings for  acute diverticulitis. 6. Mild cystocele. 7. Aortic atherosclerosis. Aortic Atherosclerosis (ICD10-I70.0) and Emphysema (ICD10-J43.9). Electronically Signed   By: Marijo Sanes M.D.   On: 06/23/2022 19:55   CT Lumbar Spine Wo Contrast  Result Date: 06/23/2022 CLINICAL DATA:  66 year old female with low back pain, multiple falls yesterday. EXAM: CT LUMBAR SPINE WITHOUT CONTRAST TECHNIQUE: Multidetector CT imaging of the lumbar spine was performed without intravenous contrast administration. Multiplanar CT image reconstructions were also generated. RADIATION DOSE REDUCTION: This exam was performed according to the departmental dose-optimization program which includes automated exposure control, adjustment of the mA and/or kV according to patient size and/or use of iterative reconstruction technique. COMPARISON:  Lumbar MRI 09/09/2014. FINDINGS: Segmentation: Normal. Alignment: Lumbar lordosis appears stable since 2016 including mild chronic retrolisthesis at each of L2-L3, L3-L4, L4-L5. Vertebrae: Maintained T12 and lumbar vertebral height. Some osteopenia. Grossly intact visible sacrum and SI joints. No acute osseous abnormality identified. Paraspinal and other soft tissues: Surgically absent gallbladder. Aortoiliac calcified atherosclerosis. Normal caliber abdominal aorta. Partially visible distal large bowel anastomosis series 4, image 121. Partially visible large bowel diverticulosis. Other visible noncontrast abdominal viscera appear negative. Lumbar paraspinal soft tissues are within normal limits. Disc levels: Lumbar spine degeneration with disc bulging and posterior element hypertrophy superimposed on chronic retrolisthesis does appear progressed since the 2016 MRI at multiple levels (right side vacuum facet now at L4-L5. However, suspect only mild spinal stenosis results. Possible more significant lateral recess stenosis at L2-L3 greater on the left (left L3 nerve level) and L4-L5 on the right (right L5  nerve level). And there is at least moderate right L4 and L5 foraminal stenosis. IMPRESSION: 1. No acute osseous abnormality in the lumbar spine. 2. Lumbar spine degeneration appears progressed since a 2016 MRI in association with multilevel chronic retrolisthesis. Only mild spinal stenosis suspected by CT, but more significant stenosis of the lateral recesses and/or foramina possible at the left L3, right L4 and right L5 nerve levels. Query associated radiculitis. 3.  Aortic Atherosclerosis (ICD10-I70.0). Electronically Signed   By: Genevie Ann M.D.   On: 06/23/2022 10:18    EKG: Independently reviewed. Sinus rhythm.   Assessment/Plan:   1. Hypertensive urgency  - BP severely elevated in ED in setting of acute pain and non-adherence with antihypertensive at home  - No evidence for end-organ injury  - Resume home metoprolol with dose now, continue pain-control, use IV labetalol as needed    2. Acute on chronic back pain  - CT in ED with only mild spinal stenosis but question of more severe stenosis at lateral recesses and or foramina of L3-L5  - No fever, weakness, or change in bowel/bladder habits  - Following with [facility] for this and waiting on insurance authorization for MRI  - Continue pain-control, outpatient follow-up as planned    3. COPD  - Not in exacerbation on admission  - Continue ICS-LAMA-LABA and prn SABA    4. Type II DM  - A1c ws 6.6% in May 2023   - Check CBGs and use low-intensity SSI for now    5. Hypothyroidism  - Continue Synthroid    DVT prophylaxis: Lovenox  Code Status: Full  Level of Care: Level of care: Progressive Family Communication: none present  Disposition Plan:  Patient is from: Home  Anticipated d/c is to: Home  Anticipated d/c date is: 11/24 or 06/25/22  Patient currently: Pending pain-control, BP-control  with oral medications  Consults called: None  Admission status: observation     Vianne Bulls, MD [facility]  06/23/2022,  10:19 PM

## 2022-06-23 NOTE — Discharge Instructions (Addendum)
You may take the naproxen as prescribed to help with your pain.  Remember to not take this with any other NSAIDs.  Please arrange follow-up with Dr. Izora Ribas. Please return to the emergency department for any new, worsening, or changing symptoms or other concerns including weakness in your legs, urinary or stool incontinence or retention, numbness or tingling in your extremities/buttocks/groin, fevers, or any other concerns or change in symptoms.

## 2022-06-23 NOTE — ED Notes (Signed)
Pt's O2 90% on RA, placed on 2L at this time. O2 96% on 2L

## 2022-06-23 NOTE — ED Provider Notes (Signed)
  Physical Exam  BP (!) 207/90   Pulse 72   Temp 98.3 F (36.8 C) (Oral)   Resp 20   Ht '5\' 8"'$  (1.727 m)   Wt 99.8 kg   SpO2 97%   BMI 33.45 kg/m   Physical Exam  General:          Awake, no distress.  He does appear in some discomfort.  Able to sit up on her own, demonstrate to me the areas that are hurting.  In no acute distress though CV:                  Good peripheral perfusion.  Truong palpable dorsalis pedis and posterior tibial pulses in the lower extremities bilaterally.  Warm well-perfused in all 4 extremities Resp:               Normal effort.  Clear bilateral Abd:                 No distention.  Obese but soft nontender nondistended.  Patient notes that palpation of the abdomen does not reproduce the pain she is experiencing in the mid abdomen Other:              Moves all extremities well.  Demonstrates good use of the lower extremities.  No neurologic deficits to noted  Procedures  Procedures  ED Course / MDM    Medical Decision Making Amount and/or Complexity of Data Reviewed Labs: ordered. Radiology: ordered.  Risk Prescription drug management. Decision regarding hospitalization.   This patient was transferred to me from Dr. Jacqualine Code.  Plan is to trial IV metoprolol and reevaluate blood pressure.  On reexamination, patient appears well, however blood pressure continues to be markedly elevated and she is having persistent pain.  Will initiate hydralazine 10 mg and morphine 4 mg.  Her lab work-up is unremarkable and her CT angio does not show any evidence of dissection, however I am concerned by the persistent nature of her hypertension.  We will plan to admit.  Spoke to the on-call hospitalist, Dr. Myna Hidalgo, who agreed to admission.       Teodoro Spray, [place] 06/23/22 2144    Delman Kitten, MD 06/24/22 [phone]

## 2022-06-23 NOTE — ED Notes (Signed)
Per Dr Jacqualine Code discontinue type and screen. Lab no longer needed.

## 2022-06-23 NOTE — ED Notes (Signed)
Pt's bed changed. Warm blanket provided.

## 2022-06-23 NOTE — ED Triage Notes (Signed)
Per EMS pt coming from home c/o back pain, nausea and abdominal pain. Patient was seen earlier for back pain and is waiting for MRI. HTN with EMS- pt states she hasn't felt well enough to take her meds today.

## 2022-06-24 DIAGNOSIS — E871 Hypo-osmolality and hyponatremia: Secondary | ICD-10-CM | POA: Diagnosis present

## 2022-06-24 DIAGNOSIS — I10 Essential (primary) hypertension: Secondary | ICD-10-CM | POA: Diagnosis present

## 2022-06-24 DIAGNOSIS — Z96653 Presence of artificial knee joint, bilateral: Secondary | ICD-10-CM | POA: Diagnosis present

## 2022-06-24 DIAGNOSIS — I160 Hypertensive urgency: Secondary | ICD-10-CM | POA: Diagnosis present

## 2022-06-24 DIAGNOSIS — M797 Fibromyalgia: Secondary | ICD-10-CM | POA: Diagnosis present

## 2022-06-24 DIAGNOSIS — R4182 Altered mental status, unspecified: Secondary | ICD-10-CM | POA: Diagnosis not present

## 2022-06-24 DIAGNOSIS — G250 Essential tremor: Secondary | ICD-10-CM | POA: Diagnosis present

## 2022-06-24 DIAGNOSIS — M1990 Unspecified osteoarthritis, unspecified site: Secondary | ICD-10-CM | POA: Diagnosis present

## 2022-06-24 DIAGNOSIS — F32A Depression, unspecified: Secondary | ICD-10-CM | POA: Diagnosis present

## 2022-06-24 DIAGNOSIS — G4730 Sleep apnea, unspecified: Secondary | ICD-10-CM | POA: Diagnosis present

## 2022-06-24 DIAGNOSIS — F419 Anxiety disorder, unspecified: Secondary | ICD-10-CM | POA: Diagnosis present

## 2022-06-24 DIAGNOSIS — E039 Hypothyroidism, unspecified: Secondary | ICD-10-CM | POA: Diagnosis present

## 2022-06-24 DIAGNOSIS — G8929 Other chronic pain: Secondary | ICD-10-CM | POA: Diagnosis not present

## 2022-06-24 DIAGNOSIS — G2581 Restless legs syndrome: Secondary | ICD-10-CM | POA: Diagnosis present

## 2022-06-24 DIAGNOSIS — M546 Pain in thoracic spine: Secondary | ICD-10-CM | POA: Diagnosis present

## 2022-06-24 DIAGNOSIS — M545 Low back pain, unspecified: Secondary | ICD-10-CM | POA: Diagnosis not present

## 2022-06-24 DIAGNOSIS — E876 Hypokalemia: Secondary | ICD-10-CM | POA: Diagnosis present

## 2022-06-24 DIAGNOSIS — F17210 Nicotine dependence, cigarettes, uncomplicated: Secondary | ICD-10-CM | POA: Diagnosis present

## 2022-06-24 DIAGNOSIS — E669 Obesity, unspecified: Secondary | ICD-10-CM | POA: Diagnosis present

## 2022-06-24 DIAGNOSIS — E1165 Type 2 diabetes mellitus with hyperglycemia: Secondary | ICD-10-CM | POA: Diagnosis present

## 2022-06-24 DIAGNOSIS — J4489 Other specified chronic obstructive pulmonary disease: Secondary | ICD-10-CM | POA: Diagnosis present

## 2022-06-24 DIAGNOSIS — E782 Mixed hyperlipidemia: Secondary | ICD-10-CM | POA: Diagnosis present

## 2022-06-24 DIAGNOSIS — E872 Acidosis, unspecified: Secondary | ICD-10-CM | POA: Diagnosis present

## 2022-06-24 DIAGNOSIS — G928 Other toxic encephalopathy: Secondary | ICD-10-CM | POA: Diagnosis not present

## 2022-06-24 DIAGNOSIS — G894 Chronic pain syndrome: Secondary | ICD-10-CM | POA: Diagnosis present

## 2022-06-24 DIAGNOSIS — G9341 Metabolic encephalopathy: Secondary | ICD-10-CM | POA: Diagnosis not present

## 2022-06-24 DIAGNOSIS — M5416 Radiculopathy, lumbar region: Secondary | ICD-10-CM | POA: Diagnosis present

## 2022-06-24 DIAGNOSIS — M48061 Spinal stenosis, lumbar region without neurogenic claudication: Secondary | ICD-10-CM | POA: Diagnosis present

## 2022-06-24 LAB — BASIC METABOLIC PANEL
Anion gap: 12 (ref 5–15)
BUN: 10 mg/dL (ref 8–23)
CO2: 26 mmol/L (ref 22–32)
Calcium: 9.5 mg/dL (ref 8.9–10.3)
Chloride: 98 mmol/L (ref 98–111)
Creatinine, Ser: 0.47 mg/dL (ref 0.44–1.00)
GFR, Estimated: >60 mL/min (ref >60)
Glucose, Bld: 235 mg/dL — ABNORMAL HIGH (ref 70–99)
Potassium: 4.2 mmol/L (ref 3.5–5.1)
Sodium: 136 mmol/L (ref 135–145)

## 2022-06-24 LAB — CBC
HCT: 47.2 % — ABNORMAL HIGH (ref 36.0–46.0)
Hemoglobin: 16.1 g/dL — ABNORMAL HIGH (ref 12.0–15.0)
MCH: 30.4 pg (ref 26.0–34.0)
MCHC: 34.1 g/dL (ref 30.0–36.0)
MCV: 89.1 fL (ref 80.0–100.0)
Platelets: 232 10*3/uL (ref 150–400)
RBC: 5.3 MIL/uL — ABNORMAL HIGH (ref 3.87–5.11)
RDW: 14.2 % (ref 11.5–15.5)
WBC: 6.4 10*3/uL (ref 4.0–10.5)
nRBC: 0 % (ref 0.0–0.2)

## 2022-06-24 LAB — CBG MONITORING, ED
Glucose-Capillary: 221 mg/dL — ABNORMAL HIGH (ref 70–99)
Glucose-Capillary: 204 mg/dL — ABNORMAL HIGH (ref 70–99)
Glucose-Capillary: 226 mg/dL — ABNORMAL HIGH (ref 70–99)
Glucose-Capillary: 200 mg/dL — ABNORMAL HIGH (ref 70–99)

## 2022-06-24 LAB — HIV ANTIBODY (ROUTINE TESTING W REFLEX): HIV Screen 4th Generation wRfx: NONREACTIVE

## 2022-06-24 MED ORDER — PANTOPRAZOLE SODIUM 40 MG IV SOLR
40.0000 mg | INTRAVENOUS | Status: DC
  Administered 2022-06-24 – 2022-06-27 (×4): 40 mg via INTRAVENOUS
  Filled 2022-06-24 (×5): qty 10

## 2022-06-24 MED ORDER — HYDRALAZINE HCL 25 MG PO TABS
25.0000 mg | ORAL_TABLET | Freq: Three times a day (TID) | ORAL | Status: DC
  Administered 2022-06-24 – 2022-06-28 (×13): 25 mg via ORAL
  Filled 2022-06-24 (×16): qty 1

## 2022-06-24 MED ORDER — AMLODIPINE BESYLATE 5 MG PO TABS
5.0000 mg | ORAL_TABLET | Freq: Every day | ORAL | Status: DC
  Administered 2022-06-24 – 2022-06-25 (×2): 5 mg via ORAL
  Filled 2022-06-24 (×2): qty 1

## 2022-06-24 NOTE — Telephone Encounter (Signed)
Requested medication (s) are due for refill today: Yes  Requested medication (s) are on the active medication list: Yes  Last refill:  03/21/22  Future visit scheduled: No  Notes to clinic: Unable to refill per protocol, courtesy refill already given, routing for provider approval.      Requested Prescriptions  Pending Prescriptions Disp Refills   metoprolol succinate (TOPROL-XL) 50 MG 24 hr tablet [Pharmacy Med Name: METOPROLOL SUCCINATE ER 50 MG TAB] 30 tablet 0    Sig: TAKE 1 TABLET BY MOUTH ONCE DAILY WITH OR IMMEDIATELY FOLLOWING A MEAL     Cardiovascular:  Beta Blockers Failed - 06/23/2022 10:36 AM      Failed - Last BP in normal range    BP Readings from Last 1 Encounters:  06/24/22 (!) 158/70         Failed - Valid encounter within last 6 months    Recent Outpatient Visits           6 months ago Encounter for health maintenance examination   [facility] [place] Thedore Mins, [place], PA-C   8 months ago [place] of mouth and esophagus [facility])   [facility] Tally Joe T, FNP   9 months ago Dyspnea on exertion   [facility] Birdie Sons, MD   9 months ago Chronic obstructive pulmonary disease with acute exacerbation [facility])   [facility] Tally Joe T, FNP   1 year ago Diabetes mellitus without complication [facility])   [place], PA-C              Passed - Last Heart Rate in normal range    Pulse Readings from Last 1 Encounters:  06/24/22 76

## 2022-06-24 NOTE — Telephone Encounter (Signed)
LM with person to call back and make appt.

## 2022-06-24 NOTE — ED Notes (Signed)
Pt has been in room multiple times since being brought to ed 11. She c/o being uncomfortable and is restless. Will no leave the cardiac leads on and keeps flipping on to her sides. She displaced the purewick and pts bed was changed after it was soiled. Pt is alert and oriented but sts she can't explain "it" she just doesn't feel good. Refer to [facility] for medicinal interventions.

## 2022-06-24 NOTE — ED Notes (Signed)
Pt. Sitting up in bed, sleepy, yet oriented. Pt. States she did not eat any breakfast and does not want anything else to eat. Pt. Has no further need at this time.

## 2022-06-24 NOTE — ED Notes (Signed)
Informed RN bed assigned 

## 2022-06-24 NOTE — ED Notes (Addendum)
This RN to bedside to medicate and assess pt. Pt. Has removed all BP/pulse ox monitoring. This RN re-explained to pt. Why monitoring is important, pt. Responds, "get all this shit off me." Pt. Explains to this RN, "my face feels on fire, my back is hurting so bad, and my stomach feels like it has a bunch of gas trapped that can't get out. This RN obtained EKG, placed pt. Back on cardiac monitoring, obtained blood glucose level, and messaged dr. Dwyane Dee and relayed pt's complaints. See MAR for treatment.

## 2022-06-24 NOTE — Progress Notes (Addendum)
PROGRESS NOTE    Linda Bradford  XAJ:[id number] DOB: 1955/12/27 DOA: 06/23/2022  PCP: [name], FNP   Brief Narrative:  This 66 years old female with PMH significant for hypertension, type 2 diabetes, COPD, hypothyroidism, chronic back pain presented in the ED with complaint of worsening back pain.  Patient reports she has been doing a lot more physical activity than usual recently while working in her garage and went on to develop severe worsening  in her chronic back pain.  She was seen in the ED and pain was improved with Toradol and Lidoderm CT head was unremarkable and patient was discharged home.  She did start back with worsening low back pain. She is following up with orthopedic surgery as outpatient and MRI is planned once insurance authorizes.  Patient was found to be hypertensive on arrival with systolic BP 094/709.  CTA chest/abdomen/pelvis negative for acute findings.  Patient is admitted for hypertensive urgency.  Assessment & Plan:   Principal Problem:   Hypertensive urgency Active Problems:   Bronchitis, chronic (HCC)   Adult hypothyroidism   Type 2 diabetes mellitus with hyperglycemia, without long-term current use of insulin (HCC)   Chronic pain syndrome   Acute exacerbation of chronic low back pain  Hypertensive urgency: Patient was found to be severely hypertensive with SBP above 207/107 associated with acute back pain and nonadherence with antihypertensive medications. There is no evidence of endorgan injury.  Home blood pressure medication resumed that include metoprolol ,  Start amlodipine 5 mg daily, hydralazine 25 mg 3 times daily. Continue IV labetalol as needed.  Acute on chronic back pain: CT in the ED showed mild spinal stenosis but there was concerning more severe stenosis at lateral recesses and foramina of L3 L5. There is no weakness or change in bowel or bladder habits noted. Patient is following up with [facility] for this and waiting on insurance  authorization for MRI. Continue adequate pain control, follow-up outpatient as planned.  COPD:  Not in acute exacerbation on admission,  Continue home inhalers.  Type 2 diabetes: Hemoglobin A1c 6.6.  Carb modified diet.   Continue regular sliding scale.  Hypothyroidism: Continue Synthroid.  DVT prophylaxis: Lovenox Code Status:Full code Family Communication: No family at bed side. Disposition Plan:   Status is: Inpatient Remains inpatient appropriate because: Patient admitted for hypertensive urgency.  Also found to have acute on chronic back pain.  PT and OT evaluation.   Consultants:  None  Procedures: CT abdomen/pelvis/chest Antimicrobials: None  Subjective: Patient was seen and examined at bedside.  Overnight events noted.   Patient reports feeling very weak.  She complains of having abdominal pain associated with nausea.  Objective: Vitals:   06/24/22 0945 06/24/22 1000 06/24/22 1015 06/24/22 1030  BP:  (!) 206/86 (!) 153/74 (!) 189/80  Pulse: 84 80 74 70  Resp: '17 13 13 13  '$ Temp:      TempSrc:      SpO2: 97% 99% 99% 95%  Weight:      Height:        Intake/Output Summary (Last 24 hours) at 06/24/2022 1441 Last data filed at 06/23/2022 2200 Gross per 24 hour  Intake --  Output 800 ml  Net -800 ml   Filed Weights   06/23/22 1804  Weight: 99.8 kg    Examination:  General exam: Appears comfortable, not in any acute distress. Respiratory system: CTA bilaterally, respiratory effort normal, RR 15. Cardiovascular system: S1 & S2 heard, regular rate and rhythm, no murmur.  Gastrointestinal system: Abdomen is soft, mildly tender, non distended, BS+ Central nervous system: Alert and oriented x 3. No focal neurological deficits. Extremities: No edema, no cyanosis, no clubbing Skin: No rashes, lesions or ulcers Psychiatry: Judgement and insight appear normal. Mood & affect appropriate.     Data Reviewed: I have personally reviewed following labs and  imaging studies  CBC: Recent Labs  Lab 06/23/22 1006 06/23/22 1827 06/24/22 0506  WBC 5.6 5.1 6.4  NEUTROABS 3.7  --   --   HGB 14.6 15.0 16.1*  HCT 43.7 44.1 47.2*  MCV 90.1 89.1 89.1  PLT 241 236 681   Basic Metabolic Panel: Recent Labs  Lab 06/23/22 1006 06/23/22 1827 06/24/22 0506  NA 130* 133* 136  K 4.5 4.1 4.2  CL 96* 96* 98  CO2 '26 27 26  '$ GLUCOSE 225* 185* 235*  BUN '12 11 10  '$ CREATININE 0.62 0.51 0.47  CALCIUM 9.6 9.2 9.5   GFR: Estimated Creatinine Clearance: 85.5 mL/min (by C-G formula based on SCr of 0.47 mg/dL). Liver Function Tests: Recent Labs  Lab 06/23/22 1827  AST 21  ALT 19  ALKPHOS 91  BILITOT 1.0  PROT 7.8  ALBUMIN 4.1   Recent Labs  Lab 06/23/22 1827  LIPASE 28   No results for input(s): "AMMONIA" in the last 168 hours. Coagulation Profile: No results for input(s): "INR", "PROTIME" in the last 168 hours. Cardiac Enzymes: No results for input(s): "CKTOTAL", "CKMB", "CKMBINDEX", "TROPONINI" in the last 168 hours. BNP (last 3 results) No results for input(s): "PROBNP" in the last 8760 hours. HbA1C: No results for input(s): "HGBA1C" in the last 72 hours. CBG: Recent Labs  Lab 06/23/22 0922 06/23/22 2217 06/24/22 0928  GLUCAP 234* 168* 221*   Lipid Profile: No results for input(s): "CHOL", "HDL", "LDLCALC", "TRIG", "CHOLHDL", "LDLDIRECT" in the last 72 hours. Thyroid Function Tests: No results for input(s): "TSH", "T4TOTAL", "FREET4", "T3FREE", "THYROIDAB" in the last 72 hours. Anemia Panel: No results for input(s): "VITAMINB12", "FOLATE", "FERRITIN", "TIBC", "IRON", "RETICCTPCT" in the last 72 hours. Sepsis Labs: Recent Labs  Lab 06/23/22 1828  LATICACIDVEN 1.5    No results found for this or any previous visit (from the past 240 hour(s)).   Radiology Studies: CT Angio Chest/Abd/Pel for Dissection W and/or Wo Contrast  Result Date: 06/23/2022 CLINICAL DATA:  Acute aortic syndrome suspected. Hypertension with chest  and abdominal pain. EXAM: CT ANGIOGRAPHY CHEST, ABDOMEN AND PELVIS TECHNIQUE: Non-contrast CT of the chest was initially obtained. Multidetector CT imaging through the chest, abdomen and pelvis was performed using the standard protocol during bolus administration of intravenous contrast. Multiplanar reconstructed images and MIPs were obtained and reviewed to evaluate the vascular anatomy. RADIATION DOSE REDUCTION: This exam was performed according to the departmental dose-optimization program which includes automated exposure control, adjustment of the mA and/or kV according to patient size and/or use of iterative reconstruction technique. CONTRAST:  170m OMNIPAQUE IOHEXOL 350 MG/ML SOLN COMPARISON:  Chest CT 12/20/2021 FINDINGS: CTA CHEST FINDINGS Cardiovascular: The heart is normal in size. No pericardial effusion. Normal caliber thoracic aorta without dissection. Scattered atherosclerotic calcifications. Scattered coronary artery calcifications. The pulmonary arteries are grossly normal. Mediastinum/Nodes: No mediastinal or hilar mass or adenopathy or hematoma. The esophagus is unremarkable. The thyroid gland is normal. Lungs/Pleura: Stable mild emphysematous changes and pulmonary scarring. No acute pulmonary process. No worrisome pulmonary lesions or pulmonary nodules. Streaky subsegmental atelectasis at both lung bases. Musculoskeletal: No breast masses, supraclavicular or axillary adenopathy. The bony thorax is intact. Review  of the MIP images confirms the above findings. CTA ABDOMEN AND PELVIS FINDINGS VASCULAR Aorta: Scattered atherosclerotic calcifications but no aneurysm or dissection. Celiac: Mild narrowing at the origin but no significant stenosis. No aneurysm or dissection. SMA: Minimal ostial calcifications but no stenosis. Renals: Ostial calcifications but no significant stenosis. IMA: Patent Inflow: Minimal atherosclerotic calcifications. No aneurysm dissection. Veins: No significant findings.  Review of the MIP images confirms the above findings. NON-VASCULAR Hepatobiliary: No hepatic lesions or intrahepatic biliary dilatation. The gallbladder is surgically absent. No common bile duct dilatation. Pancreas: Normal Spleen: Calcified granuloma but no worrisome lesions. Adrenals/Urinary Tract: The adrenal glands and kidneys are. The bladder is unremarkable. Mild cystocele is noted. Stomach/Bowel: The stomach, duodenum, small and colon unremarkable. No acute inflammatory process, mass lesions or obstructive findings. Advanced descending colon diverticulosis without findings for acute diverticulitis. Remote surgical changes involving the mid sigmoid colon. Lymphatic: No mesenteric or retroperitoneal mass or adenopathy. Reproductive: The uterus and ovaries are unremarkable. Other: No pelvic mass or adenopathy. No free pelvic fluid collections. No inguinal mass or adenopathy. No abdominal wall hernia or subcutaneous lesions. Musculoskeletal: No significant bony findings. Review of the MIP images confirms the above findings. IMPRESSION: 1. Moderate atherosclerotic calcifications involving the abdominal aorta but no aneurysm or dissection. Minimal atherosclerotic calcifications involving the thoracic aorta. 2. No acute pulmonary process. 3. Stable mild emphysematous changes and pulmonary scarring. 4. Status post cholecystectomy without biliary dilatation. 5. Advanced descending colon diverticulosis without findings for acute diverticulitis. 6. Mild cystocele. 7. Aortic atherosclerosis. Aortic Atherosclerosis (ICD10-I70.0) and Emphysema (ICD10-J43.9). Electronically Signed   By: Marijo Sanes M.D.   On: 06/23/2022 19:55   CT Lumbar Spine Wo Contrast  Result Date: 06/23/2022 CLINICAL DATA:  66 year old female with low back pain, multiple falls yesterday. EXAM: CT LUMBAR SPINE WITHOUT CONTRAST TECHNIQUE: Multidetector CT imaging of the lumbar spine was performed without intravenous contrast administration.  Multiplanar CT image reconstructions were also generated. RADIATION DOSE REDUCTION: This exam was performed according to the departmental dose-optimization program which includes automated exposure control, adjustment of the mA and/or kV according to patient size and/or use of iterative reconstruction technique. COMPARISON:  Lumbar MRI 09/09/2014. FINDINGS: Segmentation: Normal. Alignment: Lumbar lordosis appears stable since 2016 including mild chronic retrolisthesis at each of L2-L3, L3-L4, L4-L5. Vertebrae: Maintained T12 and lumbar vertebral height. Some osteopenia. Grossly intact visible sacrum and SI joints. No acute osseous abnormality identified. Paraspinal and other soft tissues: Surgically absent gallbladder. Aortoiliac calcified atherosclerosis. Normal caliber abdominal aorta. Partially visible distal large bowel anastomosis series 4, image 121. Partially visible large bowel diverticulosis. Other visible noncontrast abdominal viscera appear negative. Lumbar paraspinal soft tissues are within normal limits. Disc levels: Lumbar spine degeneration with disc bulging and posterior element hypertrophy superimposed on chronic retrolisthesis does appear progressed since the 2016 MRI at multiple levels (right side vacuum facet now at L4-L5. However, suspect only mild spinal stenosis results. Possible more significant lateral recess stenosis at L2-L3 greater on the left (left L3 nerve level) and L4-L5 on the right (right L5 nerve level). And there is at least moderate right L4 and L5 foraminal stenosis. IMPRESSION: 1. No acute osseous abnormality in the lumbar spine. 2. Lumbar spine degeneration appears progressed since a 2016 MRI in association with multilevel chronic retrolisthesis. Only mild spinal stenosis suspected by CT, but more significant stenosis of the lateral recesses and/or foramina possible at the left L3, right L4 and right L5 nerve levels. Query associated radiculitis. 3.  Aortic Atherosclerosis  (ICD10-I70.0). Electronically Signed  By: Genevie Ann M.D.   On: 06/23/2022 10:18    Scheduled Meds:  amLODipine  5 mg Oral Daily   aspirin  81 mg Oral BID   buPROPion  150 mg Oral Daily   DULoxetine  60 mg Oral BID   enoxaparin (LOVENOX) injection  0.5 mg/kg Subcutaneous Q24H   fluticasone furoate-vilanterol  1 puff Inhalation Daily   And   umeclidinium bromide  1 puff Inhalation Daily   gabapentin  1,500 mg Oral QHS   gabapentin  900 mg Oral q morning   hydrALAZINE  25 mg Oral Q8H   insulin aspart  0-5 Units Subcutaneous QHS   insulin aspart  0-6 Units Subcutaneous TID WC   levothyroxine  75 mcg Oral Q0600   lidocaine  1 patch Transdermal Q12H   metaxalone  800 mg Oral TID   metoprolol succinate  50 mg Oral Daily   nicotine  7 mg Transdermal Q0600   pantoprazole (PROTONIX) IV  40 mg Intravenous Q24H   rosuvastatin  10 mg Oral Daily   sodium chloride flush  3 mL Intravenous Q12H   Continuous Infusions:   LOS: 0 days    Time spent: 50 mins    Rosene Pilling, MD [facility]   If 7PM-7AM, please contact night-coverage

## 2022-06-24 NOTE — ED Notes (Signed)
This RN to bedside to assess pt. Pt. Is resting on stretcher, eyes closed, chest rise and fall. Pt. Is not on cardiac monitoring, per previous RN, pt. Refused to keep cardiac leads on. Will attempt to reattach leads at later time. NAD.

## 2022-06-24 NOTE — ED Notes (Signed)
Pt c/o "legs feel like they wiegh 10000 lbs."

## 2022-06-24 NOTE — ED Notes (Signed)
Messaged pharmacy for pt's inhalers.

## 2022-06-24 NOTE — ED Notes (Signed)
Pt being brought to ED rm 11 at this time, this RN now assuming care.

## 2022-06-25 DIAGNOSIS — I160 Hypertensive urgency: Secondary | ICD-10-CM | POA: Diagnosis not present

## 2022-06-25 LAB — GLUCOSE, CAPILLARY
Glucose-Capillary: 235 mg/dL — ABNORMAL HIGH (ref 70–99)
Glucose-Capillary: 206 mg/dL — ABNORMAL HIGH (ref 70–99)
Glucose-Capillary: 214 mg/dL — ABNORMAL HIGH (ref 70–99)
Glucose-Capillary: 207 mg/dL — ABNORMAL HIGH (ref 70–99)

## 2022-06-25 NOTE — Plan of Care (Signed)
  Problem: Pain Managment: Goal: General experience of comfort will improve Outcome: Progressing   Problem: Safety: Goal: Ability to remain free from injury will improve Outcome: Progressing   Problem: Skin Integrity: Goal: Risk for impaired skin integrity will decrease Outcome: Progressing   

## 2022-06-25 NOTE — Progress Notes (Signed)
Pt arrived to room 251 via bed from the ED. Received report from [place], [place]. See assessment. Will continue to monitor.

## 2022-06-25 NOTE — Progress Notes (Signed)
PT Cancellation Note  Patient Details Name: Linda Bradford MRN: [id number] DOB: [birth year]   Cancelled Treatment:    Reason Eval/Treat Not Completed: Fatigue/lethargy limiting ability to participate;Patient declined, no reason specified. Pt initially asleep, but aroused to auditory stimuli very briefly. Pt initially attempting to explain the origin of her back pain, but then quickly falling asleep during the story. She requested to lie back down, even though she remained lying in bed the entire time. PT adjusted the HOB to a lesser angle of elevation per pt request. PT will continue to follow-up with pt for evaluation as available and appropriate.    [place] 06/25/2022, 12:22 PM

## 2022-06-25 NOTE — ED Notes (Signed)
Pt's gown wet with urine, Pt changed into new gown.

## 2022-06-25 NOTE — Progress Notes (Signed)
PROGRESS NOTE    Linda Bradford  QIO:[id number] DOB: 1956-06-24 DOA: 06/23/2022  PCP: [name], FNP   Brief Narrative:  This 66 years old female with PMH significant for hypertension, type 2 diabetes, COPD, hypothyroidism, chronic back pain presented in the ED with complaint of worsening back pain.  Patient reports she has been doing a lot more physical activity than usual recently while working in her garage and went on to develop severe worsening  in her chronic back pain.  She was seen in the ED and pain was improved with Toradol and Lidoderm CT head was unremarkable and patient was discharged home.  She did start back with worsening low back pain. She is following up with orthopedic surgery as outpatient and MRI is planned once insurance authorizes.  Patient was found to be hypertensive on arrival with systolic BP 324/401.  CTA chest/abdomen/pelvis negative for acute findings.  Patient is admitted for hypertensive urgency.  Assessment & Plan:   Principal Problem:   Hypertensive urgency Active Problems:   Bronchitis, chronic (HCC)   Adult hypothyroidism   Type 2 diabetes mellitus with hyperglycemia, without long-term current use of insulin (HCC)   Chronic pain syndrome   Acute exacerbation of chronic low back pain  Hypertensive urgency: Patient was found to be severely hypertensive with SBP above 207/107 associated with acute back pain and nonadherence with antihypertensive medications. There is no evidence of endorgan injury.  Home blood pressure medications resumed that include metoprolol ,  Continue amlodipine 5 mg daily, hydralazine 25 mg 3 times daily. Continue IV labetalol as needed. BP is improving, continue to monitor.  Acute on chronic back pain: CT in the ED showed mild spinal stenosis but there was concerning more severe stenosis at lateral recesses and foramina of L3 L5. There is no weakness or change in bowel or bladder habits noted. Patient is following up with  [facility] for this and waiting on insurance authorization for MRI. Continue adequate pain control, follow-up outpatient as planned. PT and OT evaluation  COPD:  Not in acute exacerbation on admission,  Continue home inhalers.  Type 2 diabetes: Hemoglobin A1c 6.6.  Carb modified diet.   Continue regular sliding scale.  Hypothyroidism: Continue Synthroid.  DVT prophylaxis: Lovenox Code Status:Full code Family Communication: No family at bed side. Disposition Plan:   Status is: Inpatient Remains inpatient appropriate because: Patient admitted for hypertensive urgency.  Also found to have acute on chronic back pain.  PT and OT evaluation.   Consultants:  None  Procedures: CT abdomen / pelvis / chest Antimicrobials: None  Subjective: Patient was seen and examined at bedside.  Overnight events noted.  Patient reports feeling very weak. She states could not move because of back pain.  Objective: Vitals:   06/25/22 0655 06/25/22 0820 06/25/22 1146 06/25/22 1238  BP: (!) 163/86 (!) 180/76 (!) 182/78 (!) 157/67  Pulse: 74 74 81 80  Resp: '18 18 19   '$ Temp:      TempSrc:      SpO2: 93% 97% 95%   Weight:      Height:        Intake/Output Summary (Last 24 hours) at 06/25/2022 1335 Last data filed at 06/25/2022 0841 Gross per 24 hour  Intake 3 ml  Output --  Net 3 ml   Filed Weights   06/23/22 1804  Weight: 99.8 kg    Examination:  General exam: Appears comfortable, not in any acute distress. Respiratory system: CTA bilaterally, respiratory effort normal, RR  14. Cardiovascular system: S1 & S2 heard, regular rate and rhythm, no murmur. Gastrointestinal system: Abdomen is soft, mildly tender, non distended, BS+ Central nervous system: Alert and oriented x 3. No focal neurological deficits. Extremities: No edema, no cyanosis, no clubbing Skin: No rashes, lesions or ulcers Psychiatry: Judgement and insight appear normal. Mood & affect appropriate.     Data  Reviewed: I have personally reviewed following labs and imaging studies  CBC: Recent Labs  Lab 06/23/22 1006 06/23/22 1827 06/24/22 0506  WBC 5.6 5.1 6.4  NEUTROABS 3.7  --   --   HGB 14.6 15.0 16.1*  HCT 43.7 44.1 47.2*  MCV 90.1 89.1 89.1  PLT 241 236 824   Basic Metabolic Panel: Recent Labs  Lab 06/23/22 1006 06/23/22 1827 06/24/22 0506  NA 130* 133* 136  K 4.5 4.1 4.2  CL 96* 96* 98  CO2 '26 27 26  '$ GLUCOSE 225* 185* 235*  BUN '12 11 10  '$ CREATININE 0.62 0.51 0.47  CALCIUM 9.6 9.2 9.5   GFR: Estimated Creatinine Clearance: 85.5 mL/min (by C-G formula based on SCr of 0.47 mg/dL). Liver Function Tests: Recent Labs  Lab 06/23/22 1827  AST 21  ALT 19  ALKPHOS 91  BILITOT 1.0  PROT 7.8  ALBUMIN 4.1   Recent Labs  Lab 06/23/22 1827  LIPASE 28   No results for input(s): "AMMONIA" in the last 168 hours. Coagulation Profile: No results for input(s): "INR", "PROTIME" in the last 168 hours. Cardiac Enzymes: No results for input(s): "CKTOTAL", "CKMB", "CKMBINDEX", "TROPONINI" in the last 168 hours. BNP (last 3 results) No results for input(s): "PROBNP" in the last 8760 hours. HbA1C: No results for input(s): "HGBA1C" in the last 72 hours. CBG: Recent Labs  Lab 06/24/22 1608 06/24/22 2127 06/24/22 2241 06/25/22 0818 06/25/22 1147  GLUCAP 204* 226* 200* 235* 206*   Lipid Profile: No results for input(s): "CHOL", "HDL", "LDLCALC", "TRIG", "CHOLHDL", "LDLDIRECT" in the last 72 hours. Thyroid Function Tests: No results for input(s): "TSH", "T4TOTAL", "FREET4", "T3FREE", "THYROIDAB" in the last 72 hours. Anemia Panel: No results for input(s): "VITAMINB12", "FOLATE", "FERRITIN", "TIBC", "IRON", "RETICCTPCT" in the last 72 hours. Sepsis Labs: Recent Labs  Lab 06/23/22 1828  LATICACIDVEN 1.5    No results found for this or any previous visit (from the past 240 hour(s)).   Radiology Studies: CT Angio Chest/Abd/Pel for Dissection W and/or Wo  Contrast  Result Date: 06/23/2022 CLINICAL DATA:  Acute aortic syndrome suspected. Hypertension with chest and abdominal pain. EXAM: CT ANGIOGRAPHY CHEST, ABDOMEN AND PELVIS TECHNIQUE: Non-contrast CT of the chest was initially obtained. Multidetector CT imaging through the chest, abdomen and pelvis was performed using the standard protocol during bolus administration of intravenous contrast. Multiplanar reconstructed images and MIPs were obtained and reviewed to evaluate the vascular anatomy. RADIATION DOSE REDUCTION: This exam was performed according to the departmental dose-optimization program which includes automated exposure control, adjustment of the mA and/or kV according to patient size and/or use of iterative reconstruction technique. CONTRAST:  127m OMNIPAQUE IOHEXOL 350 MG/ML SOLN COMPARISON:  Chest CT 12/20/2021 FINDINGS: CTA CHEST FINDINGS Cardiovascular: The heart is normal in size. No pericardial effusion. Normal caliber thoracic aorta without dissection. Scattered atherosclerotic calcifications. Scattered coronary artery calcifications. The pulmonary arteries are grossly normal. Mediastinum/Nodes: No mediastinal or hilar mass or adenopathy or hematoma. The esophagus is unremarkable. The thyroid gland is normal. Lungs/Pleura: Stable mild emphysematous changes and pulmonary scarring. No acute pulmonary process. No worrisome pulmonary lesions or pulmonary nodules. Streaky subsegmental  atelectasis at both lung bases. Musculoskeletal: No breast masses, supraclavicular or axillary adenopathy. The bony thorax is intact. Review of the MIP images confirms the above findings. CTA ABDOMEN AND PELVIS FINDINGS VASCULAR Aorta: Scattered atherosclerotic calcifications but no aneurysm or dissection. Celiac: Mild narrowing at the origin but no significant stenosis. No aneurysm or dissection. SMA: Minimal ostial calcifications but no stenosis. Renals: Ostial calcifications but no significant stenosis. IMA: Patent  Inflow: Minimal atherosclerotic calcifications. No aneurysm dissection. Veins: No significant findings. Review of the MIP images confirms the above findings. NON-VASCULAR Hepatobiliary: No hepatic lesions or intrahepatic biliary dilatation. The gallbladder is surgically absent. No common bile duct dilatation. Pancreas: Normal Spleen: Calcified granuloma but no worrisome lesions. Adrenals/Urinary Tract: The adrenal glands and kidneys are. The bladder is unremarkable. Mild cystocele is noted. Stomach/Bowel: The stomach, duodenum, small and colon unremarkable. No acute inflammatory process, mass lesions or obstructive findings. Advanced descending colon diverticulosis without findings for acute diverticulitis. Remote surgical changes involving the mid sigmoid colon. Lymphatic: No mesenteric or retroperitoneal mass or adenopathy. Reproductive: The uterus and ovaries are unremarkable. Other: No pelvic mass or adenopathy. No free pelvic fluid collections. No inguinal mass or adenopathy. No abdominal wall hernia or subcutaneous lesions. Musculoskeletal: No significant bony findings. Review of the MIP images confirms the above findings. IMPRESSION: 1. Moderate atherosclerotic calcifications involving the abdominal aorta but no aneurysm or dissection. Minimal atherosclerotic calcifications involving the thoracic aorta. 2. No acute pulmonary process. 3. Stable mild emphysematous changes and pulmonary scarring. 4. Status post cholecystectomy without biliary dilatation. 5. Advanced descending colon diverticulosis without findings for acute diverticulitis. 6. Mild cystocele. 7. Aortic atherosclerosis. Aortic Atherosclerosis (ICD10-I70.0) and Emphysema (ICD10-J43.9). Electronically Signed   By: Marijo Sanes M.D.   On: 06/23/2022 19:55    Scheduled Meds:  amLODipine  5 mg Oral Daily   aspirin  81 mg Oral BID   buPROPion  150 mg Oral Daily   DULoxetine  60 mg Oral BID   enoxaparin (LOVENOX) injection  0.5 mg/kg Subcutaneous  Q24H   fluticasone furoate-vilanterol  1 puff Inhalation Daily   And   umeclidinium bromide  1 puff Inhalation Daily   gabapentin  1,500 mg Oral QHS   gabapentin  900 mg Oral q morning   hydrALAZINE  25 mg Oral Q8H   insulin aspart  0-5 Units Subcutaneous QHS   insulin aspart  0-6 Units Subcutaneous TID WC   levothyroxine  75 mcg Oral Q0600   lidocaine  1 patch Transdermal Q12H   metaxalone  800 mg Oral TID   metoprolol succinate  50 mg Oral Daily   nicotine  7 mg Transdermal Q0600   pantoprazole (PROTONIX) IV  40 mg Intravenous Q24H   rosuvastatin  10 mg Oral Daily   sodium chloride flush  3 mL Intravenous Q12H   Continuous Infusions:   LOS: 1 day    Time spent: 35 mins    Damika Harmon, MD [facility]   If 7PM-7AM, please contact night-coverage

## 2022-06-26 DIAGNOSIS — I160 Hypertensive urgency: Secondary | ICD-10-CM | POA: Diagnosis not present

## 2022-06-26 LAB — CBC
HCT: 46.9 % — ABNORMAL HIGH (ref 36.0–46.0)
Hemoglobin: 16.3 g/dL — ABNORMAL HIGH (ref 12.0–15.0)
MCH: 30.7 pg (ref 26.0–34.0)
MCHC: 34.8 g/dL (ref 30.0–36.0)
MCV: 88.3 fL (ref 80.0–100.0)
Platelets: 281 10*3/uL (ref 150–400)
RBC: 5.31 MIL/uL — ABNORMAL HIGH (ref 3.87–5.11)
RDW: 14.5 % (ref 11.5–15.5)
WBC: 8.6 10*3/uL (ref 4.0–10.5)
nRBC: 0 % (ref 0.0–0.2)

## 2022-06-26 LAB — BASIC METABOLIC PANEL
Anion gap: 8 (ref 5–15)
BUN: 40 mg/dL — ABNORMAL HIGH (ref 8–23)
CO2: 25 mmol/L (ref 22–32)
Calcium: 9.7 mg/dL (ref 8.9–10.3)
Chloride: 102 mmol/L (ref 98–111)
Creatinine, Ser: 0.62 mg/dL (ref 0.44–1.00)
GFR, Estimated: >60 mL/min (ref >60)
Glucose, Bld: 217 mg/dL — ABNORMAL HIGH (ref 70–99)
Potassium: 3.7 mmol/L (ref 3.5–5.1)
Sodium: 135 mmol/L (ref 135–145)

## 2022-06-26 LAB — GLUCOSE, CAPILLARY
Glucose-Capillary: 194 mg/dL — ABNORMAL HIGH (ref 70–99)
Glucose-Capillary: 294 mg/dL — ABNORMAL HIGH (ref 70–99)
Glucose-Capillary: 237 mg/dL — ABNORMAL HIGH (ref 70–99)
Glucose-Capillary: 203 mg/dL — ABNORMAL HIGH (ref 70–99)

## 2022-06-26 LAB — MAGNESIUM: Magnesium: 2.5 mg/dL — ABNORMAL HIGH (ref 1.7–2.4)

## 2022-06-26 LAB — BRAIN NATRIURETIC PEPTIDE: B Natriuretic Peptide: 20.2 pg/mL (ref 0.0–100.0)

## 2022-06-26 LAB — PHOSPHORUS: Phosphorus: 2.6 mg/dL (ref 2.5–4.6)

## 2022-06-26 MED ORDER — AMLODIPINE BESYLATE 10 MG PO TABS
10.0000 mg | ORAL_TABLET | Freq: Every day | ORAL | Status: DC
  Administered 2022-06-26 – 2022-07-01 (×6): 10 mg via ORAL
  Filled 2022-06-26 (×6): qty 1

## 2022-06-26 NOTE — Progress Notes (Signed)
PT Cancellation Note  Patient Details Name: Linda Bradford MRN: [id number] DOB: Feb 23, 1956   Cancelled Treatment:    Reason Eval/Treat Not Completed: Medical issues which prohibited therapy (Per chart review, BP elevated at vitals check SBP >19mHg. Communicated with RN regarding this. WIll attempt again at later date/time.)  11:22 AM, 06/26/22 AEtta Grandchild PT, DPT Physical Therapist [facility] [phone]([place]    Linda Bradford C 06/26/2022, 11:22 AM

## 2022-06-26 NOTE — Progress Notes (Signed)
PROGRESS NOTE    Linda Bradford  ZSW:[id number] DOB: 1956-04-06 DOA: 06/23/2022  PCP: [name], FNP   Brief Narrative:  This 66 years old female with PMH significant for hypertension, type 2 diabetes, COPD, hypothyroidism, chronic back pain presented in the ED with complaint of worsening back pain.  Patient reports she has been doing a lot more physical activity than usual recently while working in her garage and went on to develop severe worsening  in her chronic back pain.  She was seen in the ED and pain was improved with Toradol and Lidoderm CT head was unremarkable and patient was discharged home.  She did start back with worsening low back pain. She is following up with orthopedic surgery as outpatient and MRI is planned once insurance authorizes.  Patient was found to be hypertensive on arrival with systolic BP 322/025.  CTA chest/abdomen/pelvis negative for acute findings.  Patient is admitted for hypertensive urgency.  Assessment & Plan:   Principal Problem:   Hypertensive urgency Active Problems:   Bronchitis, chronic (HCC)   Adult hypothyroidism   Type 2 diabetes mellitus with hyperglycemia, without long-term current use of insulin (HCC)   Chronic pain syndrome   Acute exacerbation of chronic low back pain  Hypertensive urgency: Patient was found to be severely hypertensive with SBP above 207/107 associated with acute back pain and nonadherence with antihypertensive medications. There is no evidence of end organ injury.  Home blood pressure medications resumed that include metoprolol ,  Increase amlodipine 10 mg daily, Continue hydralazine 25 mg 3 times daily. Continue IV labetalol as needed. BP is improving, continue to monitor.  Acute on chronic back pain: CT in the ED showed mild spinal stenosis but there was concerning more severe stenosis at lateral recesses and foramina of L3 L5. There is no weakness or change in bowel or bladder habits noted. Patient is following  up with [facility] for this and waiting on insurance authorization for MRI. Continue adequate pain control, follow-up outpatient as planned. PT and OT evaluation pending  COPD:  Not in acute exacerbation on admission,  Continue home inhalers.  Type 2 diabetes: Hemoglobin A1c 6.6.  Carb modified diet.   Continue regular sliding scale.  Hypothyroidism: Continue Synthroid.  DVT prophylaxis: Lovenox Code Status:Full code Family Communication: No family at bed side. Disposition Plan:   Status is: Inpatient Remains inpatient appropriate because: Patient admitted for hypertensive urgency.  Also found to have acute on chronic back pain.  PT and OT evaluation.   Consultants:  None  Procedures: CT abdomen / pelvis / chest Antimicrobials: None  Subjective: Patient was seen and examined at bedside.  Overnight events noted.  Patient reports feeling very weak states she could not move because of the back pain. Blood pressure is improving, physical therapy still pending since  BP was up.   Objective: Vitals:   06/26/22 0008 06/26/22 0447 06/26/22 0811 06/26/22 1145  BP: 136/62 (!) 159/69 (!) 184/79 (!) 156/61  Pulse: 94 93 98 89  Resp: '19 19 19 18  '$ Temp: 98 F (36.7 C) (!) 97.4 F (36.3 C) 98 F (36.7 C)   TempSrc: Oral Oral    SpO2: 100% 100% 93% 93%  Weight:      Height:        Intake/Output Summary (Last 24 hours) at 06/26/2022 1217 Last data filed at 06/26/2022 1023 Gross per 24 hour  Intake 3 ml  Output 575 ml  Net -572 ml   [employer]  06/23/22 1804 06/25/22 0830  Weight: 99.8 kg 97.9 kg    Examination:  General exam: Appears comfortable, not in any acute distress.  Deconditioned Respiratory system: CTA bilaterally, respiratory effort normal, RR 14. Cardiovascular system: S1 & S2 heard, regular rate and rhythm, no murmur. Gastrointestinal system: Abdomen is soft, mildly tender, non distended, BS+ Central nervous system: Alert and oriented x3, no  focal neurological deficits. Extremities: No edema, no cyanosis, no clubbing Skin: No rashes, lesions or ulcers Psychiatry: Judgement and insight appear normal. Mood & affect appropriate.     Data Reviewed: I have personally reviewed following labs and imaging studies  CBC: Recent Labs  Lab 06/23/22 1006 06/23/22 1827 06/24/22 0506  WBC 5.6 5.1 6.4  NEUTROABS 3.7  --   --   HGB 14.6 15.0 16.1*  HCT 43.7 44.1 47.2*  MCV 90.1 89.1 89.1  PLT 241 236 277   Basic Metabolic Panel: Recent Labs  Lab 06/23/22 1006 06/23/22 1827 06/24/22 0506  NA 130* 133* 136  K 4.5 4.1 4.2  CL 96* 96* 98  CO2 '26 27 26  '$ GLUCOSE 225* 185* 235*  BUN '12 11 10  '$ CREATININE 0.62 0.51 0.47  CALCIUM 9.6 9.2 9.5   GFR: Estimated Creatinine Clearance: 84.6 mL/min (by C-G formula based on SCr of 0.47 mg/dL). Liver Function Tests: Recent Labs  Lab 06/23/22 1827  AST 21  ALT 19  ALKPHOS 91  BILITOT 1.0  PROT 7.8  ALBUMIN 4.1   Recent Labs  Lab 06/23/22 1827  LIPASE 28   No results for input(s): "AMMONIA" in the last 168 hours. Coagulation Profile: No results for input(s): "INR", "PROTIME" in the last 168 hours. Cardiac Enzymes: No results for input(s): "CKTOTAL", "CKMB", "CKMBINDEX", "TROPONINI" in the last 168 hours. BNP (last 3 results) No results for input(s): "PROBNP" in the last 8760 hours. HbA1C: No results for input(s): "HGBA1C" in the last 72 hours. CBG: Recent Labs  Lab 06/25/22 1147 06/25/22 1650 06/25/22 2209 06/26/22 0812 06/26/22 1144  GLUCAP 206* 214* 207* 194* 294*   Lipid Profile: No results for input(s): "CHOL", "HDL", "LDLCALC", "TRIG", "CHOLHDL", "LDLDIRECT" in the last 72 hours. Thyroid Function Tests: No results for input(s): "TSH", "T4TOTAL", "FREET4", "T3FREE", "THYROIDAB" in the last 72 hours. Anemia Panel: No results for input(s): "VITAMINB12", "FOLATE", "FERRITIN", "TIBC", "IRON", "RETICCTPCT" in the last 72 hours. Sepsis Labs: Recent Labs  Lab  06/23/22 1828  LATICACIDVEN 1.5    No results found for this or any previous visit (from the past 240 hour(s)).   Radiology Studies: No results found.  Scheduled Meds:  amLODipine  10 mg Oral Daily   aspirin  81 mg Oral BID   buPROPion  150 mg Oral Daily   DULoxetine  60 mg Oral BID   enoxaparin (LOVENOX) injection  0.5 mg/kg Subcutaneous Q24H   fluticasone furoate-vilanterol  1 puff Inhalation Daily   And   umeclidinium bromide  1 puff Inhalation Daily   gabapentin  1,500 mg Oral QHS   gabapentin  900 mg Oral q morning   hydrALAZINE  25 mg Oral Q8H   insulin aspart  0-5 Units Subcutaneous QHS   insulin aspart  0-6 Units Subcutaneous TID WC   levothyroxine  75 mcg Oral Q0600   lidocaine  1 patch Transdermal Q12H   metaxalone  800 mg Oral TID   metoprolol succinate  50 mg Oral Daily   nicotine  7 mg Transdermal Q0600   pantoprazole (PROTONIX) IV  40 mg Intravenous Q24H  rosuvastatin  10 mg Oral Daily   sodium chloride flush  3 mL Intravenous Q12H   Continuous Infusions:   LOS: 2 days    Time spent: 35 mins    Trustin Chapa, MD [facility]   If 7PM-7AM, please contact night-coverage

## 2022-06-26 NOTE — Plan of Care (Signed)
  Problem: Clinical Measurements: Goal: Respiratory complications will improve Outcome: Progressing   Problem: Coping: Goal: Level of anxiety will decrease Outcome: Progressing   Problem: Pain Managment: Goal: General experience of comfort will improve Outcome: Progressing   Problem: Safety: Goal: Ability to remain free from injury will improve Outcome: Progressing   Problem: Skin Integrity: Goal: Risk for impaired skin integrity will decrease Outcome: Progressing

## 2022-06-26 NOTE — Evaluation (Signed)
Physical Therapy Evaluation Patient Details Name: Linda Bradford MRN: [id number] DOB: 22-Apr-1956 Today's Date: 06/26/2022  History of Present Illness  Linda Bradford is a 31yoF comes to [facility] 11/23 c progressive back pain. Patient was found to be hypertensive on arrival 207/160mHg. PMH: HTN, COPD, hypoTSH, chronic LBP, s/p R TKA on 01/10/22. CT in the ED showed mild spinal stenosis but there was concerning more severe stenosis at lateral recesses and foramina of L3 L5.  Clinical Impression  Author waited until BP is within acceptable range for PT evaluation. Per RN, pt has been hypersomnolent much of day, alerts to engagement albeit briefly. Pt remains hypersomnolent for my visit, falls asleep several times mid interaction with me, when awake has repeated yawning as well. Responses to line of questions is fairly inappropriate, hence aPryor Bradford basic orientation questions and gets some troublesome responses. Pt reporting the year as "2003". When asked to say where she currently is, she first reports "here," then " the hospital," and when cued for more detail offers "Linda Bradford my sister's house- no, my house." Linda Bradford if she typically uses a walker for mobility, to which she replies she walked here to the hospital. Pt is able to mobilize to/from supine with minA, near-max effort required and very distractible. Attempts to stand on cue appear disorganized, poorly planned, and unsafe, author ultimately stops further attempts for mobility. Pt reports she cannot feel her feet wile sitting up- attempted subsequent exam of sensation, but mentation/somnolence preclude this. Able to hear some wet sounds in breathing during session, Pt on room air at 91-92%. Pt assisted back into bed, HOB elevated to 40 degrees, heels floating. MD/RN made aware of pt presentation which appears to be quite worse from this morning's assessment. Will continue to follow.       Recommendations for follow up therapy are  one component of a multi-disciplinary discharge planning process, led by the attending physician.  Recommendations may be updated based on patient status, additional functional criteria and insurance authorization.  Follow Up Recommendations Skilled nursing-short term rehab (<3 hours/day)      Assistance Recommended at Discharge    Patient can return home with the following  Two people to help with bathing/dressing/bathroom;Two people to help with walking and/or transfers;Direct supervision/assist for financial management;Direct supervision/assist for medications management;Help with stairs or ramp for entrance;Assistance with cooking/housework;Assist for transportation    Equipment Recommendations None recommended by PT  Recommendations for Other Services       Functional Status Assessment Patient has had a recent decline in their functional status and demonstrates the ability to make significant improvements in function in a reasonable and predictable amount of time.     Precautions / Restrictions Precautions Precautions: Fall Precaution Comments: significant AMS on evaluation Restrictions Weight Bearing Restrictions: No      Mobility  Bed Mobility Overal bed mobility: Needs Assistance Bed Mobility: Supine to Sit, Sit to Supine     Supine to sit: Min assist Sit to supine: Min assist   General bed mobility comments: labored, distracted, requires cues to attend to task, can balance at EOB, but falls asleep repeatedly unless constantly stimulated- also yawning frequnetly.    Transfers Overall transfer level: Needs assistance   Transfers: Sit to/from Stand             General transfer comment: pt attempts several times, requires cues due to distractibility, feet slide out several times, pt lacks problem solving skils to attempt a different technique, falls alseep; we  decide to cease mobility at this time for safety;    Ambulation/Gait                   Stairs            Wheelchair Mobility    Modified Rankin (Stroke Patients Only)       Balance                                             Pertinent Vitals/Pain Pain Assessment Pain Assessment: Faces Faces Pain Scale: Hurts little more Pain Location: back pain while bed is supine, otherwise denies pain Pain Intervention(s): Limited activity within patient's tolerance, Monitored during session    Linda Bradford expects to be discharged to:: Private residence Living Arrangements: Spouse/significant other Available Help at Discharge: Friend(s);Available PRN/intermittently;Home health Type of Home: House Home Access: Stairs to enter Entrance Stairs-Rails: Psychiatric nurse of Steps: 4   Home Layout: One level Home Equipment: Cane - single point;Grab bars - tub/shower;Rolling Walker (2 wheels);Shower seat - built in;BSC/3in1 Additional Comments: At last admission in June, pt was caregiver to both her husband with physical impairments and sister-in-law with down syndrome. Pt has hired a home health care agency to come out to the ouse QD for the next 6 weeks to help with cleaning of home as well as caring for husband and SIL.    Prior Function Prior Level of Function : Independent/Modified Independent;Driving;History of Falls (last six months)             Mobility Comments: Independent with ADLs and IADLs. Drives.       Hand Dominance        Extremity/Trunk Assessment                Communication      Cognition Arousal/Alertness: Lethargic Behavior During Therapy: Impulsive (distracible, somnolent, unfocused; appears intoxicated) Overall Cognitive Status: Impaired/Different from baseline Area of Impairment: Orientation                 Orientation Level:  (tell sme is 2003, that she is at "[place] at my sister's house- no, my house," and that she walked here with her walker.)                       General Comments      Exercises     Assessment/Plan    PT Assessment Patient needs continued PT services  PT Problem List Decreased strength;Decreased range of motion;Decreased activity tolerance;Decreased balance;Decreased mobility;Decreased coordination;Decreased cognition;Cardiopulmonary status limiting activity;Decreased safety awareness       PT Treatment Interventions DME instruction;Gait training;Stair training;Functional mobility training;Therapeutic activities;Therapeutic exercise;Balance training;Neuromuscular re-education;Patient/family education;Cognitive remediation    PT Goals (Current goals can be found in the Care Plan section)  Acute Rehab PT Goals PT Goal Formulation: Patient unable to participate in goal setting    Frequency Min 2X/week     Co-evaluation               AM-PAC PT "6 Clicks" Mobility  Outcome Measure Help needed turning from your back to your side while in a flat bed without using bedrails?: A Lot Help needed moving from lying on your back to sitting on the side of a flat bed without using bedrails?: A Lot Help needed moving to and from a bed to a chair (  including a wheelchair)?: Total Help needed standing up from a chair using your arms (e.g., wheelchair or bedside chair)?: Total Help needed to walk in hospital room?: Total Help needed climbing 3-5 steps with a railing? : Total 6 Click Score: 8    End of Session   Activity Tolerance: Patient limited by lethargy;Treatment limited secondary to medical complications (Comment) (worsening mental status.) Patient left: in bed;with call bell/phone within reach;with bed alarm set (HOB >40 degrees, heels elevated) Nurse Communication: Mobility status (concerns for AMS and wet breathing sounds) PT Visit Diagnosis: Other symptoms and signs involving the nervous system (R29.898);Other abnormalities of gait and mobility (R26.89)    Time: 1537-1600 PT Time Calculation (min)  (ACUTE ONLY): 23 min   Charges:   PT Evaluation $PT Eval Moderate Complexity: 1 Mod         4:13 PM, 06/26/22 Etta Grandchild, PT, DPT Physical Therapist - [facility]  [phone] ([place])    Saydie Gerdts C 06/26/2022, 4:06 PM

## 2022-06-26 NOTE — Progress Notes (Signed)
Pt now AxO X2 to person and time. Earlier this shift, pt previously AxO X4 with forgetfulness at times and easily re-oriented. Shawna Clamp, MD aware of neuro change r/t orientation. See new lab orders.

## 2022-06-27 DIAGNOSIS — I160 Hypertensive urgency: Secondary | ICD-10-CM | POA: Diagnosis not present

## 2022-06-27 LAB — BLOOD GAS, VENOUS
Acid-Base Excess: 1.5 mmol/L (ref 0.0–2.0)
Bicarbonate: 24.7 mmol/L (ref 20.0–28.0)
O2 Saturation: 92.8 %
Patient temperature: 37
pCO2, Ven: 34 mmHg — ABNORMAL LOW (ref 44–60)
pH, Ven: 7.47 — ABNORMAL HIGH (ref 7.25–7.43)
pO2, Ven: 63 mmHg — ABNORMAL HIGH (ref 32–45)

## 2022-06-27 LAB — GLUCOSE, CAPILLARY
Glucose-Capillary: 211 mg/dL — ABNORMAL HIGH (ref 70–99)
Glucose-Capillary: 228 mg/dL — ABNORMAL HIGH (ref 70–99)
Glucose-Capillary: 214 mg/dL — ABNORMAL HIGH (ref 70–99)
Glucose-Capillary: 223 mg/dL — ABNORMAL HIGH (ref 70–99)

## 2022-06-27 NOTE — Evaluation (Signed)
Occupational Therapy Evaluation Patient Details Name: Linda Bradford MRN: [id number] DOB: April 11, 1956 Today's Date: 06/27/2022   History of Present Illness Linda Bradford is a 35yoF comes to [facility] 11/23 c progressive back pain. Patient was found to be hypertensive on arrival 207/124mHg. PMH: HTN, COPD, hypoTSH, chronic LBP, s/p R TKA on 01/10/22. CT in the ED showed mild spinal stenosis but there was concerning more severe stenosis at lateral recesses and foramina of L3 L5.   Clinical Impression   Patient presenting with decreased independence in self care, balance, functional mobility/transfers, endurance, and safety awareness. Pt is a poor historian 2/2 change in cognitive status and unable to provide PLOF at this time. During evaluation, pt required Min-Mod A for bed mobility, Max A to stand from EOB, and Max A for self-feeding. Pt with limited standing tolerance and unable/unsafe to attempt functional mobility at this time. She was oriented to her name, "hospital", and "[place]". Pt required Max multimodal cues for initiation, sequencing, safety awareness, and problem solving. Pt will benefit from acute OT to increase overall independence in the areas of ADLs and functional mobility in order to safely discharge to next venue of care. MD present at end of session and aware of pt presentation.     Recommendations for follow up therapy are one component of a multi-disciplinary discharge planning process, led by the attending physician.  Recommendations may be updated based on patient status, additional functional criteria and insurance authorization.   Follow Up Recommendations  Skilled nursing-short term rehab (<3 hours/day)     Assistance Recommended at Discharge Frequent or constant Supervision/Assistance  Patient can return home with the following Two people to help with walking and/or transfers;Direct supervision/assist for medications management;Direct supervision/assist for financial  management;Assistance with feeding;Assistance with cooking/housework;Assist for transportation;Help with stairs or ramp for entrance;Two people to help with bathing/dressing/bathroom    Functional Status Assessment  Patient has had a recent decline in their functional status and demonstrates the ability to make significant improvements in function in a reasonable and predictable amount of time.  Equipment Recommendations  Other (comment) (defer to next venue of care)    Recommendations for Other Services       Precautions / Restrictions Precautions Precautions: Fall Precaution Comments: significant AMS on evaluation Restrictions Weight Bearing Restrictions: No      Mobility Bed Mobility Overal bed mobility: Needs Assistance Bed Mobility: Supine to Sit, Sit to Supine     Supine to sit: Mod assist Sit to supine: Min assist   General bed mobility comments: required increased time 2/2 difficulty with initiation & sequencing    Transfers Overall transfer level: Needs assistance Equipment used: Rolling walker (2 wheels) Transfers: Sit to/from Stand Sit to Stand: Max assist                  Balance Overall balance assessment: Needs assistance Sitting-balance support: Feet supported Sitting balance-Leahy Scale: Good     Standing balance support: Bilateral upper extremity supported, Reliant on assistive device for balance Standing balance-Leahy Scale: Poor Standing balance comment: limited standing tolerance (~20 seconds), unwilling to attempt further standing attempts 2/2 fatigue                           ADL either performed or assessed with clinical judgement   ADL Overall ADL's : Needs assistance/impaired Eating/Feeding: Maximal assistance;Cueing for sequencing Eating/Feeding Details (indicate cue type and reason): Pt had difficulty reaching for and grasping utensils/cups 2/2 BUE intention tremors,  unable to grab spoon & retrieve a bite of oatmeal  requiring therapist to place spoon in hand with oatmeal already on spoon, then able to bring spoon to mouth Grooming: Wash/dry face;Brushing hair;Set up;Sitting;Cueing for sequencing Grooming Details (indicate cue type and reason): Placed comb in pt's hand (just stared at it), required initial VC to brush her hair. Repeated with washcloth.                 Toilet Transfer: Maximal assistance;Rolling walker (2 wheels) Toilet Transfer Details (indicate cue type and reason): simulated with STS from EOB                 Vision Baseline Vision/History: 1 Wears glasses Patient Visual Report: No change from baseline       Perception     Praxis      Pertinent Vitals/Pain Pain Assessment Pain Assessment: No/denies pain     Hand Dominance     Extremity/Trunk Assessment Upper Extremity Assessment Upper Extremity Assessment: Generalized weakness;Difficult to assess due to impaired cognition (BUE intention tremors (pt reports this is baseline))   Lower Extremity Assessment Lower Extremity Assessment: Generalized weakness;Difficult to assess due to impaired cognition   Cervical / Trunk Assessment Cervical / Trunk Assessment: Normal   Communication Communication Communication: No difficulties   Cognition Arousal/Alertness: Lethargic Behavior During Therapy: Impulsive, Flat affect Overall Cognitive Status: Impaired/Different from baseline Area of Impairment: Orientation, Attention, Following commands, Safety/judgement, Awareness, Problem solving, Memory                 Orientation Level: Disoriented to, Time, Situation, Place Current Attention Level: Selective Memory: Decreased short-term memory Following Commands: Follows one step commands inconsistently Safety/Judgement: Decreased awareness of safety, Decreased awareness of deficits Awareness: Intellectual Problem Solving: Decreased initiation, Difficulty sequencing, Requires verbal cues, Requires tactile cues,  Slow processing General Comments: Oriented to name but not DOB, responded with "physical therapy" when asked where she is, able to state hospital when given 2 choices (home vs hospital), unable to state name of hospital, stated [facility] when asked what state she is in, d/o time & situation, Max multimodal cues for initiation/sequencing/safety awareness/problem solving, required repetition/increased time to follow one step commands, VC for redirection     General Comments       Exercises Other Exercises Other Exercises: OT provided education re: role of OT, OT POC, post acute recs, sitting up for all meals, EOB/OOB mobility with assistance, home/fall safety.     Shoulder Instructions      Home Living Family/patient expects to be discharged to:: Private residence Living Arrangements: Spouse/significant other Available Help at Discharge: Friend(s);Available PRN/intermittently;Home health Type of Home: House Home Access: Stairs to enter [employer] of Steps: 4 Entrance Stairs-Rails: Right;Left Home Layout: One level     Bathroom Shower/Tub: Occupational psychologist: Standard     Home Equipment: Cane - single point;Grab bars - tub/shower;Rolling Walker (2 wheels);Shower seat - built in;BSC/3in1   Additional Comments: Information obtained from EMR. At last admission in June, pt was caregiver to both her husband with physical impairments and sister-in-law with down syndrome. Pt has hired a home health care agency to come out to the house QD for the next 6 weeks to help with cleaning of home as well as caring for husband and SIL.      Prior Functioning/Environment Prior Level of Function : Patient poor historian/Family not available  OT Problem List: Decreased strength;Decreased activity tolerance;Impaired balance (sitting and/or standing);Decreased cognition;Decreased safety awareness;Decreased coordination;Impaired UE functional  use;Decreased knowledge of use of DME or AE      OT Treatment/Interventions: Self-care/ADL training;Therapeutic exercise;Therapeutic activities;Cognitive remediation/compensation;Energy conservation;DME and/or AE instruction;Patient/family education;Balance training    OT Goals(Current goals can be found in the care plan section) Acute Rehab OT Goals Patient Stated Goal: none stated OT Goal Formulation: Patient unable to participate in goal setting Time For Goal Achievement: 07/11/22 Potential to Achieve Goals: Fair   OT Frequency: Min 2X/week    Co-evaluation              AM-PAC OT "6 Clicks" Daily Activity     Outcome Measure Help from another person eating meals?: A Lot Help from another person taking care of personal grooming?: A Lot Help from another person toileting, which includes using toliet, bedpan, or urinal?: A Lot Help from another person bathing (including washing, rinsing, drying)?: A Lot Help from another person to put on and taking off regular upper body clothing?: A Lot Help from another person to put on and taking off regular lower body clothing?: A Lot 6 Click Score: 12   End of Session Equipment Utilized During Treatment: Gait belt;Rolling walker (2 wheels) Nurse Communication: Mobility status  Activity Tolerance: Patient limited by fatigue;Other (comment) (2/2 cognition) Patient left: in bed;with call bell/phone within reach;with bed alarm set  OT Visit Diagnosis: Unsteadiness on feet (R26.81);Muscle weakness (generalized) (M62.81);Other symptoms and signs involving cognitive function                Time: 4287-6811 OT Time Calculation (min): 28 min Charges:  OT General Charges $OT Visit: 1 Visit OT Evaluation $OT Eval Moderate Complexity: 1 Mod  [place] MS, OTR/L ascom [phone]  06/27/22, 1:44 PM

## 2022-06-27 NOTE — NC FL2 (Signed)
[place] LEVEL OF CARE SCREENING TOOL     IDENTIFICATION  Patient Name: Linda Bradford Birthdate: 1956/06/13 Sex: female Admission Date (Current Location): 06/23/2022  [facility] and [place] Number:  Engineering geologist and Address:  [facility], [address] [place], [place] [address]      Provider Number:    Attending Physician Name and Address:  Shawna Clamp, MD  Relative Name and Phone Number:  Clair Gulling [phone]    Current Level of Care: Hospital Recommended Level of Care: [place] Prior Approval Number:    Date Approved/Denied:   PASRR Number: pending  Discharge Plan: SNF    Current Diagnoses: Patient Active Problem List   Diagnosis Date Noted   Hypertensive urgency 06/23/2022   Acute exacerbation of chronic low back pain 06/23/2022   Trochanteric bursitis of right hip 04/05/2022   Acquired trigger finger of left ring finger 03/17/2022   Stiffness of right knee 02/03/2022   Pain in joint of right knee 01/24/2022   S/P TKR (total knee replacement) using cement, right 01/10/2022   Vulvar rash 12/07/2021   LVH (left ventricular hypertrophy) 10/24/2021   Aortic regurgitation 09/73/5329   Systolic murmur 92/42/6834   Thrush of mouth and esophagus ([place]) 10/01/2021   Skin yeast infection 10/01/2021   Vaginal yeast infection 10/01/2021   Current chronic use of inhaled steroid 10/01/2021   Chronic pain syndrome 10/01/2021   Mixed hyperlipidemia 10/01/2021   Nodule of left lung 09/20/2021   Hyperlipidemia associated with type 2 diabetes mellitus ([place]) 09/02/2021   Type 2 diabetes mellitus with hyperglycemia, without long-term current use of insulin ([place]) 09/02/2021   Hypertension associated with type 2 diabetes mellitus ([place]) 09/02/2021   Vaginal cyst 09/02/2021   COPD with exacerbation ([place]) 09/02/2021   Osteoarthritis of left knee 06/08/2021   History of total knee arthroplasty 05/17/2021   S/P total knee  replacement, left 05/10/2021   Healthcare maintenance 05/03/2021   Contusion of coccyx 03/15/2021   Osteoarthritis of knees, bilateral 04/22/2020   Chronic obstructive pulmonary disease with acute exacerbation (HCC) 12/31/2019   Cough 12/31/2019   Biliary dyskinesia    Diverticulitis of large intestine with abscess 05/27/2018   Diabetes mellitus without complication ([place]) 19/62/2297   Lateral epicondylitis 07/07/2017   Personal history of tobacco use, presenting hazards to health 05/03/2016   Bronchitis, chronic ([place]) 03/03/2015   Anxiety, generalized 11/05/2014   Alcohol abuse, in remission 11/05/2014   Nicotine addiction 11/05/2014   H/O: osteoarthritis 11/05/2014   Fibromyalgia 11/05/2014   Restless leg syndrome 11/05/2014   Arthralgia of hip 01/13/2014   Adiposity 01/13/2014   Disordered sleep 01/13/2014   Airway hyperreactivity 12/17/2013   Adult hypothyroidism 12/17/2013   Current tobacco use 12/17/2013   Basal cell carcinoma 11/04/2013   Arthropathia 02/08/2012   Arthritis of knee, left 02/08/2012    Orientation RESPIRATION BLADDER Height & Weight     Self  Normal Incontinent, External catheter Weight: 215 lb 13.3 oz (97.9 kg) Height:  '5\' 8"'$  (172.7 cm)  BEHAVIORAL SYMPTOMS/MOOD NEUROLOGICAL BOWEL NUTRITION STATUS     (forgetful) Continent Diet  AMBULATORY STATUS COMMUNICATION OF NEEDS Skin   Supervision Verbally Normal                       Personal Care Assistance Level of Assistance  Bathing, Feeding, Dressing Bathing Assistance: Limited assistance Feeding assistance: Maximum assistance Dressing Assistance: Limited assistance     Functional Limitations Info  Sight, Hearing,  Speech Sight Info: Adequate Hearing Info: Adequate Speech Info: Adequate    SPECIAL CARE FACTORS FREQUENCY                       Contractures Contractures Info: Not present    Additional Factors Info  Code Status, Allergies Code Status Info: full Allergies Info:  Dexilant (Dexlansoprazole)           Current Medications (06/27/2022):  This is the current hospital active medication list Current Facility-Administered Medications  Medication Dose Route Frequency Provider Last Rate Last Admin   acetaminophen (TYLENOL) tablet 650 mg  650 mg Oral Q6H PRN Opyd, Ilene Qua, MD       Or   acetaminophen (TYLENOL) suppository 650 mg  650 mg Rectal Q6H PRN Opyd, Ilene Qua, MD       albuterol (PROVENTIL) (2.5 MG/3ML) 0.083% nebulizer solution 2.5 mg  2.5 mg Inhalation Q4H PRN Opyd, Ilene Qua, MD       amLODipine (NORVASC) tablet 10 mg  10 mg Oral Daily Shawna Clamp, MD   10 mg at 06/27/22 1024   aspirin chewable tablet 81 mg  81 mg Oral BID Opyd, Ilene Qua, MD   81 mg at 06/27/22 1024   bisacodyl (DULCOLAX) EC tablet 5 mg  5 mg Oral Daily PRN Opyd, Ilene Qua, MD   5 mg at 06/24/22 0950   buPROPion (WELLBUTRIN XL) 24 hr tablet 150 mg  150 mg Oral Daily Opyd, Ilene Qua, MD   150 mg at 06/27/22 1024   DULoxetine (CYMBALTA) DR capsule 60 mg  60 mg Oral BID Opyd, Ilene Qua, MD   60 mg at 06/27/22 1024   enoxaparin (LOVENOX) injection 50 mg  0.5 mg/kg Subcutaneous Q24H Opyd, Ilene Qua, MD   50 mg at 06/26/22 2057   fluticasone furoate-vilanterol (BREO ELLIPTA) 200-25 MCG/ACT 1 puff  1 puff Inhalation Daily Opyd, Ilene Qua, MD   1 puff at 06/27/22 0813   And   umeclidinium bromide (INCRUSE ELLIPTA) 62.5 MCG/ACT 1 puff  1 puff Inhalation Daily Opyd, Ilene Qua, MD   1 puff at 06/27/22 0813   gabapentin (NEURONTIN) capsule 1,500 mg  1,500 mg Oral QHS Opyd, Ilene Qua, MD   1,500 mg at 06/26/22 2057   gabapentin (NEURONTIN) capsule 900 mg  900 mg Oral q morning Opyd, Ilene Qua, MD   900 mg at 06/27/22 1024   hydrALAZINE (APRESOLINE) tablet 25 mg  25 mg Oral Q8H Shawna Clamp, MD   25 mg at 06/27/22 1447   HYDROmorphone (DILAUDID) injection 0.5 mg  0.5 mg Intravenous Q3H PRN Opyd, Ilene Qua, MD   0.5 mg at 06/25/22 2135   hydrOXYzine (ATARAX) tablet 10 mg  10 mg Oral TID  PRN Opyd, Ilene Qua, MD       insulin aspart (novoLOG) injection 0-5 Units  0-5 Units Subcutaneous QHS Opyd, Ilene Qua, MD   2 Units at 06/26/22 2307   insulin aspart (novoLOG) injection 0-6 Units  0-6 Units Subcutaneous TID WC Opyd, Ilene Qua, MD   2 Units at 06/27/22 1144   labetalol (NORMODYNE) injection 10 mg  10 mg Intravenous Q2H PRN Opyd, Ilene Qua, MD   10 mg at 06/25/22 1158   levothyroxine (SYNTHROID) tablet 75 mcg  75 mcg Oral Q0600 Vianne Bulls, MD   75 mcg at 06/27/22 0542   lidocaine (LIDODERM) 5 % 1 patch  1 patch Transdermal Q12H Opyd, Ilene Qua, MD   1 patch at 06/27/22  1022   metaxalone (SKELAXIN) tablet 800 mg  800 mg Oral TID Vianne Bulls, MD   800 mg at 06/27/22 1024   metoprolol succinate (TOPROL-XL) 24 hr tablet 50 mg  50 mg Oral Daily Opyd, Ilene Qua, MD   50 mg at 06/27/22 1024   nicotine (NICODERM CQ - dosed in mg/24 hr) patch 7 mg  7 mg Transdermal Q0600 Opyd, Ilene Qua, MD   7 mg at 06/27/22 0546   nicotine polacrilex (NICORETTE) gum 2 mg  2 mg Oral Q2H PRN Opyd, Ilene Qua, MD       ondansetron (ZOFRAN) tablet 4 mg  4 mg Oral Q6H PRN Opyd, Ilene Qua, MD       Or   ondansetron (ZOFRAN) injection 4 mg  4 mg Intravenous Q6H PRN Opyd, Ilene Qua, MD   4 mg at 06/25/22 0857   oxyCODONE (Oxy IR/ROXICODONE) immediate release tablet 5 mg  5 mg Oral Q4H PRN Opyd, Ilene Qua, MD   5 mg at 06/26/22 4332   pantoprazole (PROTONIX) injection 40 mg  40 mg Intravenous Q24H Shawna Clamp, MD   40 mg at 06/27/22 1146   rosuvastatin (CRESTOR) tablet 10 mg  10 mg Oral Daily Opyd, Ilene Qua, MD   10 mg at 06/27/22 1024   senna-docusate (Senokot-S) tablet 1 tablet  1 tablet Oral QHS PRN Opyd, Ilene Qua, MD   1 tablet at 06/26/22 2059   sodium chloride flush (NS) 0.9 % injection 3 mL  3 mL Intravenous Q12H Opyd, Ilene Qua, MD   3 mL at 06/27/22 1026     Discharge Medications: Please see discharge summary for a list of discharge medications.  Relevant Imaging Results:  Relevant Lab  Results:   Additional Information ss#: [id number]  [place], LCSW

## 2022-06-27 NOTE — Care Management Important Message (Signed)
Important Message  Patient Details  Name: Linda Bradford MRN: [id number] Date of Birth: 04/07/1956   Medicare Important Message Given:  N/A - LOS <3 / Initial given by admissions     Juliann Pulse A Chaney Maclaren 06/27/2022, 11:01 AM

## 2022-06-27 NOTE — Inpatient Diabetes Management (Signed)
Inpatient Diabetes Program Recommendations  AACE/ADA: New Consensus Statement on Inpatient Glycemic Control   Target Ranges:  Prepandial:   less than 140 mg/dL      Peak postprandial:   less than 180 mg/dL (1-2 hours)      Critically ill patients:  140 - 180 mg/dL    Latest Reference Range & Units 06/26/22 08:12 06/26/22 11:44 06/26/22 16:44 06/26/22 21:31 06/27/22 07:26  Glucose-Capillary 70 - 99 mg/dL 194 (H) 294 (H) 237 (H) 203 (H) 211 (H)   Review of Glycemic Control  Diabetes history: DM2 Outpatient Diabetes medications: Metformin XR 1000 mg daily Current orders for Inpatient glycemic control: Novolog 0-6 units TID with meals, Novolog 0-5 units QHS  Inpatient Diabetes Program Recommendations:    Insulin: Please consider increasing Novolog correction to 0-9 units TID with meals.  Diet: Please consider discontinuing Regular diet and ordering Carb Modified diet if appropriate.  Thanks, Barnie Alderman, RN, MSN, [place] Diabetes Coordinator Inpatient Diabetes Program [phone] (Team Pager from 8am to [place])

## 2022-06-27 NOTE — TOC Progression Note (Signed)
Transition of Care [facility]) - Progression Note    Patient Details  Name: Linda Bradford MRN: [id number] Date of Birth: [birth year]  Transition of [facility]) CM/SW [place], LCSW Phone Number: 06/27/2022, 12:32 PM  Clinical Narrative:    CSW met with pt to discuss SNF.  Pt was a little confused.  CSW informed pt that spouse would be called.  CSW called spouse, Clair Gulling,  to discuss SNFs. He said there's no preference.  Once bed offers given, CSW will discuss with Clair Gulling about choice if there are more than 2 offers.         Expected Discharge Plan and Services SNF Barriers:  Continued medical workup                                                 Social Determinants of Health (SDOH) Interventions    Readmission Risk Interventions     No data to display

## 2022-06-27 NOTE — Progress Notes (Signed)
PROGRESS NOTE    Linda Bradford  EPP:[id number] DOB: 1956-05-26 DOA: 06/23/2022  PCP: [name], FNP   Brief Narrative:  This 66 years old female with PMH significant for hypertension, type 2 diabetes, COPD, hypothyroidism, chronic back pain presented in the ED with complaint of worsening back pain.  Patient reports she has been doing a lot more physical activity than usual recently while working in her garage and went on to develop severe worsening  in her chronic back pain.  She was seen in the ED and pain was improved with Toradol and Lidoderm CT head was unremarkable and patient was discharged home.  She did start back with worsening low back pain. She is following up with orthopedic surgery as outpatient and MRI is planned once insurance authorizes.  Patient was found to be hypertensive on arrival with systolic BP 606/301.  CTA chest/abdomen/pelvis negative for acute findings.  Patient is admitted for hypertensive urgency.  Assessment & Plan:   Principal Problem:   Hypertensive urgency Active Problems:   Bronchitis, chronic (HCC)   Adult hypothyroidism   Type 2 diabetes mellitus with hyperglycemia, without long-term current use of insulin (HCC)   Chronic pain syndrome   Acute exacerbation of chronic low back pain  Hypertensive urgency: > Improving Patient was found to be severely hypertensive with SBP above 207/107 associated with acute back pain and nonadherence with antihypertensive medications. There is no evidence of end organ injury.  Home blood pressure medications resumed that included metoprolol ,  Continue amlodipine 10 mg daily, Continue hydralazine 25 mg 3 times daily. Continue IV labetalol as needed. BP is improving, continue to monitor.  Acute on chronic back pain: CT in the ED showed mild spinal stenosis but there was concerning more severe stenosis at lateral recesses and foramina of L3 L5. There is no weakness or change in bowel or bladder habits noted. Patient  is following up with [facility] for this and waiting on insurance authorization for MRI. Continue adequate pain control, follow-up outpatient as planned. PT and OT recommended SNF  COPD:  Not in acute exacerbation on admission,  Continue home inhalers.  Type 2 diabetes: Hemoglobin A1c 6.6.  Carb modified diet.   Continue regular sliding scale.  Hypothyroidism: Continue Synthroid.  DVT prophylaxis: Lovenox Code Status:Full code Family Communication: No family at bed side. Disposition Plan:   Status is: Inpatient Remains inpatient appropriate because: Patient admitted for hypertensive urgency.  Also found to have acute on chronic back pain.  PT and OT recommended SNF.   Consultants:  None  Procedures: CT abdomen / pelvis / chest Antimicrobials: None  Subjective: Patient was seen and examined at bedside.  Overnight events noted. Patient reports feeling very weak,  states she has participated in physical therapy and was advised rehab. Blood pressure has improved.   Objective: Vitals:   06/27/22 0005 06/27/22 0532 06/27/22 0724 06/27/22 1108  BP: (!) 157/60 (!) 167/69 134/71 (!) 160/68  Pulse: 88 91 94 (!) 102  Resp: '20 20 18 20  '$ Temp: 97.9 F (36.6 C) 98.3 F (36.8 C) (!) 97.5 F (36.4 C) 98.5 F (36.9 C)  TempSrc: Oral Oral Oral   SpO2: 92% 96% 95% 92%  Weight:      Height:        Intake/Output Summary (Last 24 hours) at 06/27/2022 1419 Last data filed at 06/27/2022 0900 Gross per 24 hour  Intake 240 ml  Output 425 ml  Net -185 ml   Filed Weights   06/23/22  1804 06/25/22 0830  Weight: 99.8 kg 97.9 kg    Examination:  General exam: Appears comfortable, not in any acute distress.  Deconditioned Respiratory system: CTA bilaterally, respiratory effort normal, RR 14. Cardiovascular system: S1-S2 heard, regular rate and rhythm, no murmur. Gastrointestinal system: Abdomen is soft, mildly tender, non distended, BS+ Central nervous system: Alert and  oriented x3, no focal neurological deficits. Back: tenderness noted. Limited ROM. Extremities: No edema, no cyanosis, no clubbing Skin: No rashes, lesions or ulcers Psychiatry: Judgement and insight appear normal. Mood & affect appropriate.     Data Reviewed: I have personally reviewed following labs and imaging studies  CBC: Recent Labs  Lab 06/23/22 1006 06/23/22 1827 06/24/22 0506 06/26/22 1823  WBC 5.6 5.1 6.4 8.6  NEUTROABS 3.7  --   --   --   HGB 14.6 15.0 16.1* 16.3*  HCT 43.7 44.1 47.2* 46.9*  MCV 90.1 89.1 89.1 88.3  PLT 241 236 232 591   Basic Metabolic Panel: Recent Labs  Lab 06/23/22 1006 06/23/22 1827 06/24/22 0506 06/26/22 1823  NA 130* 133* 136 135  K 4.5 4.1 4.2 3.7  CL 96* 96* 98 102  CO2 '26 27 26 25  '$ GLUCOSE 225* 185* 235* 217*  BUN '12 11 10 '$ 40*  CREATININE 0.62 0.51 0.47 0.62  CALCIUM 9.6 9.2 9.5 9.7  MG  --   --   --  2.5*  PHOS  --   --   --  2.6   GFR: Estimated Creatinine Clearance: 84.6 mL/min (by C-G formula based on SCr of 0.62 mg/dL). Liver Function Tests: Recent Labs  Lab 06/23/22 1827  AST 21  ALT 19  ALKPHOS 91  BILITOT 1.0  PROT 7.8  ALBUMIN 4.1   Recent Labs  Lab 06/23/22 1827  LIPASE 28   No results for input(s): "AMMONIA" in the last 168 hours. Coagulation Profile: No results for input(s): "INR", "PROTIME" in the last 168 hours. Cardiac Enzymes: No results for input(s): "CKTOTAL", "CKMB", "CKMBINDEX", "TROPONINI" in the last 168 hours. BNP (last 3 results) No results for input(s): "PROBNP" in the last 8760 hours. HbA1C: No results for input(s): "HGBA1C" in the last 72 hours. CBG: Recent Labs  Lab 06/26/22 1144 06/26/22 1644 06/26/22 2131 06/27/22 0726 06/27/22 1105  GLUCAP 294* 237* 203* 211* 228*   Lipid Profile: No results for input(s): "CHOL", "HDL", "LDLCALC", "TRIG", "CHOLHDL", "LDLDIRECT" in the last 72 hours. Thyroid Function Tests: No results for input(s): "TSH", "T4TOTAL", "FREET4", "T3FREE",  "THYROIDAB" in the last 72 hours. Anemia Panel: No results for input(s): "VITAMINB12", "FOLATE", "FERRITIN", "TIBC", "IRON", "RETICCTPCT" in the last 72 hours. Sepsis Labs: Recent Labs  Lab 06/23/22 1828  LATICACIDVEN 1.5    No results found for this or any previous visit (from the past 240 hour(s)).   Radiology Studies: No results found.  Scheduled Meds:  amLODipine  10 mg Oral Daily   aspirin  81 mg Oral BID   buPROPion  150 mg Oral Daily   DULoxetine  60 mg Oral BID   enoxaparin (LOVENOX) injection  0.5 mg/kg Subcutaneous Q24H   fluticasone furoate-vilanterol  1 puff Inhalation Daily   And   umeclidinium bromide  1 puff Inhalation Daily   gabapentin  1,500 mg Oral QHS   gabapentin  900 mg Oral q morning   hydrALAZINE  25 mg Oral Q8H   insulin aspart  0-5 Units Subcutaneous QHS   insulin aspart  0-6 Units Subcutaneous TID WC   levothyroxine  75 mcg  Oral Q0600   lidocaine  1 patch Transdermal Q12H   metaxalone  800 mg Oral TID   metoprolol succinate  50 mg Oral Daily   nicotine  7 mg Transdermal Q0600   pantoprazole (PROTONIX) IV  40 mg Intravenous Q24H   rosuvastatin  10 mg Oral Daily   sodium chloride flush  3 mL Intravenous Q12H   Continuous Infusions:   LOS: 3 days    Time spent: 35 mins    Shaylin Blatt, MD [facility]   If 7PM-7AM, please contact night-coverage

## 2022-06-28 ENCOUNTER — Inpatient Hospital Stay: Payer: Medicare PPO

## 2022-06-28 DIAGNOSIS — I160 Hypertensive urgency: Secondary | ICD-10-CM | POA: Diagnosis not present

## 2022-06-28 LAB — AMMONIA: Ammonia: 17 umol/L (ref 9–35)

## 2022-06-28 LAB — GLUCOSE, CAPILLARY
Glucose-Capillary: 203 mg/dL — ABNORMAL HIGH (ref 70–99)
Glucose-Capillary: 225 mg/dL — ABNORMAL HIGH (ref 70–99)
Glucose-Capillary: 240 mg/dL — ABNORMAL HIGH (ref 70–99)
Glucose-Capillary: 196 mg/dL — ABNORMAL HIGH (ref 70–99)

## 2022-06-28 MED ORDER — GABAPENTIN 300 MG PO CAPS: 600.0000 mg | ORAL_CAPSULE | Freq: Every morning | ORAL | Status: DC

## 2022-06-28 MED ORDER — PANTOPRAZOLE SODIUM 40 MG PO TBEC
40.0000 mg | DELAYED_RELEASE_TABLET | Freq: Every day | ORAL | Status: DC
  Administered 2022-06-29 – 2022-07-01 (×3): 40 mg via ORAL
  Filled 2022-06-28 (×3): qty 1

## 2022-06-28 MED ORDER — OXYCODONE HCL 5 MG PO TABS
5.0000 mg | ORAL_TABLET | Freq: Four times a day (QID) | ORAL | Status: DC | PRN
  Administered 2022-06-30 – 2022-07-01 (×4): 5 mg via ORAL
  Filled 2022-06-28 (×5): qty 1

## 2022-06-28 MED ORDER — INSULIN ASPART 100 UNIT/ML IJ SOLN
0.0000 [IU] | Freq: Every day | INTRAMUSCULAR | Status: DC
  Administered 2022-06-29 – 2022-06-30 (×2): 3 [IU] via SUBCUTANEOUS
  Filled 2022-06-28 (×2): qty 1

## 2022-06-28 MED ORDER — PRIMIDONE 50 MG PO TABS
50.0000 mg | ORAL_TABLET | Freq: Two times a day (BID) | ORAL | Status: DC
  Administered 2022-06-28 – 2022-06-29 (×3): 50 mg via ORAL
  Filled 2022-06-28 (×3): qty 1

## 2022-06-28 MED ORDER — MUSCLE RUB 10-15 % EX CREA
1.0000 | TOPICAL_CREAM | CUTANEOUS | Status: DC | PRN
  Administered 2022-06-28: 1 via TOPICAL
  Filled 2022-06-28: qty 85

## 2022-06-28 MED ORDER — HYDRALAZINE HCL 50 MG PO TABS
50.0000 mg | ORAL_TABLET | Freq: Three times a day (TID) | ORAL | Status: DC
  Administered 2022-06-28 – 2022-07-01 (×9): 50 mg via ORAL
  Filled 2022-06-28 (×9): qty 1

## 2022-06-28 MED ORDER — INSULIN ASPART 100 UNIT/ML IJ SOLN
0.0000 [IU] | Freq: Three times a day (TID) | INTRAMUSCULAR | Status: DC
  Administered 2022-06-28 – 2022-06-29 (×2): 3 [IU] via SUBCUTANEOUS
  Administered 2022-06-29: 7 [IU] via SUBCUTANEOUS
  Administered 2022-06-29 – 2022-06-30 (×2): 3 [IU] via SUBCUTANEOUS
  Administered 2022-06-30: 2 [IU] via SUBCUTANEOUS
  Administered 2022-06-30: 3 [IU] via SUBCUTANEOUS
  Administered 2022-07-01: 2 [IU] via SUBCUTANEOUS
  Administered 2022-07-01: 9 [IU] via SUBCUTANEOUS
  Filled 2022-06-28 (×9): qty 1

## 2022-06-28 MED ORDER — GABAPENTIN 400 MG PO CAPS
1200.0000 mg | ORAL_CAPSULE | Freq: Every day | ORAL | Status: DC
  Administered 2022-06-28: 1200 mg via ORAL
  Filled 2022-06-28: qty 3

## 2022-06-28 MED ORDER — OXYBUTYNIN CHLORIDE 5 MG PO TABS
5.0000 mg | ORAL_TABLET | Freq: Every day | ORAL | Status: DC
  Administered 2022-06-28 – 2022-06-30 (×3): 5 mg via ORAL
  Filled 2022-06-28 (×3): qty 1

## 2022-06-28 MED ORDER — GUAIFENESIN-DM 100-10 MG/5ML PO SYRP
5.0000 mL | ORAL_SOLUTION | ORAL | Status: DC | PRN
  Administered 2022-06-28: 5 mL via ORAL
  Filled 2022-06-28: qty 10

## 2022-06-28 NOTE — Progress Notes (Signed)
Patient to remain in bed at this time due to waxing/waning mentation and confusion. Patient alert at times and responsive to sternal rub at other times, and c/o back pain when sitting too far upright at this time. Patient repositioned in bed and bed placed in chair position. Nursing staff continue to safety round and redirect patient. Patient requests k-pad be removed at this time.    06/28/22 0800  Mobility  Activity Moved into chair position in bed

## 2022-06-28 NOTE — Progress Notes (Signed)
RE: Jessilyn Catino Date of Birth: 11/25/1955 Date: 06/28/22     To Whom It May Concern:   Please be advised that the above-named patient will require a short-term nursing home stay - anticipated 30 days or less for rehabilitation and strengthening.  The plan is for return home  Kelby Fam, [place], [place], [place]

## 2022-06-28 NOTE — Consult Note (Signed)
PHARMACIST - PHYSICIAN COMMUNICATION  DR:   Shawna Clamp, MD  CONCERNING: IV to Oral Route Change Policy  RECOMMENDATION: This patient is receiving pantoprazole by the intravenous route.  Based on criteria approved by the Pharmacy and Therapeutics Committee, the intravenous medication(s) is/are being converted to the equivalent oral dose form(s).   DESCRIPTION: These criteria include: The patient is eating (either orally or via tube) and/or has been taking other orally administered medications for a least 24 hours The patient has no evidence of active gastrointestinal bleeding or impaired GI absorption (gastrectomy, short bowel, patient on TNA or NPO).  If you have questions about this conversion, please contact the Pharmacy Department  '[]'$   [phone] )  [facility] '[x]'$   [phone] )  [facility] '[]'$   [phone] )  [facility] '[]'$   [phone] )  [facility] '[]'$   [phone] )  [place], PharmD PGY1 Pharmacy Resident 06/28/2022 11:00 AM

## 2022-06-28 NOTE — Progress Notes (Signed)
PROGRESS NOTE    Linda Bradford  CNO:[id number] DOB: [birth year] DOA: 06/23/2022  PCP: [name], FNP   Brief Narrative:  This 66 years old female with PMH significant for hypertension, type 2 diabetes, COPD, hypothyroidism, chronic back pain presented in the ED with complaint of worsening back pain.  Patient reports she has been doing a lot more physical activity than usual recently while working in her garage and went on to develop severe worsening  in her chronic back pain.  She was seen in the ED and pain was improved with Toradol and Lidoderm CT head was unremarkable and patient was discharged home.  She did start back with worsening low back pain. She is following up with orthopedic surgery as outpatient and MRI is planned once insurance authorizes.  Patient was found to be hypertensive on arrival with systolic BP 294/765. CTA chest/abdomen/pelvis negative for acute findings.  Patient is admitted for hypertensive urgency.  Assessment & Plan:   Principal Problem:   Hypertensive urgency Active Problems:   Bronchitis, chronic (HCC)   Adult hypothyroidism   Type 2 diabetes mellitus with hyperglycemia, without long-term current use of insulin (HCC)   Chronic pain syndrome   Acute exacerbation of chronic low back pain  Hypertensive urgency: > Improving Patient was found to be severely hypertensive with SBP above 207/107 associated with acute back pain and nonadherence with antihypertensive medications. There is no evidence of end organ injury.  Home blood pressure medications resumed that included metoprolol ,  Continue amlodipine 10 mg daily, Increase hydralazine 50 mg 3 times daily. Continue IV labetalol as needed. BP is improving, continue to monitor.  Acute on chronic back pain: CT in the ED showed mild spinal stenosis but there was concerning more severe stenosis at lateral recesses and foramina of L3 L5. There is no weakness or change in bowel or bladder habits noted. Patient is  following up with [facility] for this and waiting on insurance authorization for MRI. Continue adequate pain control, follow-up outpatient as planned. PT and OT recommended SNF  Acute encephalopathy : Likely secondary to polypharmacy. CT head no acute abnormality.  Ammonia level 17, VBG shows mildly elevated PCO2. Patient is fully awake in the morning as the day progresses he becomes lethargic. Medication adjusted.  Gabapentin, Cymbalta , skelexin doses reduced. Continue to monitor.  COPD:  Not in acute exacerbation on admission,  Continue home inhalers.  Type 2 diabetes: Hemoglobin A1c 6.6.  Carb modified diet.   Continue regular sliding scale.  Hypothyroidism: Continue Synthroid.  DVT prophylaxis: Lovenox Code Status:Full code Family Communication: No family at bed side. Disposition Plan:   Status is: Inpatient Remains inpatient appropriate because: Patient admitted for hypertensive urgency.  Also found to have acute on chronic back pain.  PT and OT recommended SNF.   Consultants:  None  Procedures: CT abdomen / pelvis / chest Antimicrobials: None  Subjective: Patient was seen and examined at bedside.  Overnight events noted. Patient reports feeling much better.  She is awake, following commands. Blood pressure is improving.   Objective: Vitals:   06/28/22 0005 06/28/22 0311 06/28/22 0808 06/28/22 1115  BP: 122/81 132/64 (!) 160/80 (!) 163/76  Pulse: 84 80 92 94  Resp: '20 12 18 18  '$ Temp: 98.6 F (37 C) 98.9 F (37.2 C) 97.7 F (36.5 C) 97.6 F (36.4 C)  TempSrc: Oral  Oral Axillary  SpO2: 100% 98% 95% 100%  Weight:      Height:  Intake/Output Summary (Last 24 hours) at 06/28/2022 1442 Last data filed at 06/28/2022 7425 Gross per 24 hour  Intake --  Output 0 ml  Net 0 ml   Filed Weights   06/23/22 1804 06/25/22 0830  Weight: 99.8 kg 97.9 kg    Examination:  General exam: Appears well, not in any acute distress.   Deconditioned. Respiratory system: CTA bilaterally, respiratory effort normal, RR 14. Cardiovascular system: S1-S2 heard, regular rate and rhythm, no murmur. Gastrointestinal system: Abdomen is soft, mildly tender, non distended, BS+ Central nervous system: Alert and oriented x 3, no focal neurological deficits. Back: Low back tenderness noted, limited ROM Extremities: No edema, no cyanosis, no clubbing Skin: No rashes, lesions or ulcers Psychiatry: Judgement and insight appear normal. Mood & affect appropriate.     Data Reviewed: I have personally reviewed following labs and imaging studies  CBC: Recent Labs  Lab 06/23/22 1006 06/23/22 1827 06/24/22 0506 06/26/22 1823  WBC 5.6 5.1 6.4 8.6  NEUTROABS 3.7  --   --   --   HGB 14.6 15.0 16.1* 16.3*  HCT 43.7 44.1 47.2* 46.9*  MCV 90.1 89.1 89.1 88.3  PLT 241 236 232 956   Basic Metabolic Panel: Recent Labs  Lab 06/23/22 1006 06/23/22 1827 06/24/22 0506 06/26/22 1823  NA 130* 133* 136 135  K 4.5 4.1 4.2 3.7  CL 96* 96* 98 102  CO2 '26 27 26 25  '$ GLUCOSE 225* 185* 235* 217*  BUN '12 11 10 '$ 40*  CREATININE 0.62 0.51 0.47 0.62  CALCIUM 9.6 9.2 9.5 9.7  MG  --   --   --  2.5*  PHOS  --   --   --  2.6   GFR: Estimated Creatinine Clearance: 84.6 mL/min (by C-G formula based on SCr of 0.62 mg/dL). Liver Function Tests: Recent Labs  Lab 06/23/22 1827  AST 21  ALT 19  ALKPHOS 91  BILITOT 1.0  PROT 7.8  ALBUMIN 4.1   Recent Labs  Lab 06/23/22 1827  LIPASE 28   Recent Labs  Lab 06/28/22 0918  AMMONIA 17   Coagulation Profile: No results for input(s): "INR", "PROTIME" in the last 168 hours. Cardiac Enzymes: No results for input(s): "CKTOTAL", "CKMB", "CKMBINDEX", "TROPONINI" in the last 168 hours. BNP (last 3 results) No results for input(s): "PROBNP" in the last 8760 hours. HbA1C: No results for input(s): "HGBA1C" in the last 72 hours. CBG: Recent Labs  Lab 06/27/22 1105 06/27/22 1558 06/27/22 2136  06/28/22 0813 06/28/22 1119  GLUCAP 228* 214* 223* 203* 225*   Lipid Profile: No results for input(s): "CHOL", "HDL", "LDLCALC", "TRIG", "CHOLHDL", "LDLDIRECT" in the last 72 hours. Thyroid Function Tests: No results for input(s): "TSH", "T4TOTAL", "FREET4", "T3FREE", "THYROIDAB" in the last 72 hours. Anemia Panel: No results for input(s): "VITAMINB12", "FOLATE", "FERRITIN", "TIBC", "IRON", "RETICCTPCT" in the last 72 hours. Sepsis Labs: Recent Labs  Lab 06/23/22 1828  LATICACIDVEN 1.5    No results found for this or any previous visit (from the past 240 hour(s)).   Radiology Studies: CT HEAD WO CONTRAST (5MM)  Result Date: 06/28/2022 CLINICAL DATA:  Mental status change, unknown cause EXAM: CT HEAD WITHOUT CONTRAST TECHNIQUE: Contiguous axial images were obtained from the base of the skull through the vertex without intravenous contrast. RADIATION DOSE REDUCTION: This exam was performed according to the departmental dose-optimization program which includes automated exposure control, adjustment of the mA and/or kV according to patient size and/or use of iterative reconstruction technique. COMPARISON:  12/28/2017 FINDINGS:  Brain: There is periventricular white matter decreased attenuation consistent with small vessel ischemic changes. Gray-white differentiation is preserved. No acute intracranial hemorrhage, mass effect or shift. No hydrocephalus. Vascular: No hyperdense vessel or unexpected calcification. Skull: Normal. Negative for fracture or focal lesion. Sinuses/Orbits: No acute finding. IMPRESSION: Periventricular white matter changes consistent with chronic small vessel ischemia. No acute intracranial process identified. Electronically Signed   By: Sammie Bench M.D.   On: 06/28/2022 09:45    Scheduled Meds:  amLODipine  10 mg Oral Daily   aspirin  81 mg Oral BID   buPROPion  150 mg Oral Daily   DULoxetine  60 mg Oral BID   enoxaparin (LOVENOX) injection  0.5 mg/kg  Subcutaneous Q24H   fluticasone furoate-vilanterol  1 puff Inhalation Daily   And   umeclidinium bromide  1 puff Inhalation Daily   gabapentin  1,200 mg Oral QHS   [START ON 06/29/2022] gabapentin  600 mg Oral q morning   hydrALAZINE  50 mg Oral Q8H   insulin aspart  0-5 Units Subcutaneous QHS   insulin aspart  0-9 Units Subcutaneous TID WC   levothyroxine  75 mcg Oral Q0600   lidocaine  1 patch Transdermal Q12H   metoprolol succinate  50 mg Oral Daily   nicotine  7 mg Transdermal Q0600   [START ON 06/29/2022] pantoprazole  40 mg Oral Daily   primidone  50 mg Oral BID   rosuvastatin  10 mg Oral Daily   sodium chloride flush  3 mL Intravenous Q12H   Continuous Infusions:   LOS: 4 days    Time spent: 35 mins    Kriya Westra, MD [facility]   If 7PM-7AM, please contact night-coverage

## 2022-06-28 NOTE — Progress Notes (Signed)
Physical Therapy Treatment Patient Details Name: Linda Bradford MRN: [id number] DOB: 1956-02-20 Today's Date: 06/28/2022   History of Present Illness Linda Bradford is a 77yoF comes [facility]/23 c progressive back pain. Patient was found to be hypertensive on arrival 207/112mHg. PMH: HTN, COPD, hypoTSH, chronic LBP, s/p R TKA on 01/10/22. CT in the ED showed mild spinal stenosis but there was concerning more severe stenosis at lateral recesses and foramina of L3 L5.    PT Comments    Pt seen early in day after return from CT in hope to catch her with best possible mentation. Pt is awake and generally oriented, but does have fluctuating mentation, in particular worse when exerting herself and upright- pt also c/o stomach pain after final standing phase, curious for orthostatic hypotension, especially given her 30 mmHg different in sitting compared to 8am assessment supine, but warrants formal assessment. Pt does better with bed mobility but remains very weak and limited. Pt able to rise to standing 3 times in session as intervention, but this is significantly exerting. Pt assisted back to supine, sitting tall in bed to find a more palliative position for her back pain complaint. Son at bedside. Pt again reports numbness in feet upon sitting on EOB, similar to evaluation visit. This is new compared to baseline, but does align well with lumbar spine imaging findings. Discussed PT recs to rehab with son/pt.     Recommendations for follow up therapy are one component of a multi-disciplinary discharge planning process, led by the attending physician.  Recommendations may be updated based on patient status, additional functional criteria and insurance authorization.  Follow Up Recommendations  Skilled nursing-short term rehab (<3 hours/day) Can patient physically be transported by private vehicle: No   Assistance Recommended at Discharge Intermittent Supervision/Assistance  Patient can return home with the  following Two people to help with bathing/dressing/bathroom;Two people to help with walking and/or transfers;Direct supervision/assist for financial management;Direct supervision/assist for medications management;Help with stairs or ramp for entrance;Assistance with cooking/housework;Assist for transportation   Equipment Recommendations  None recommended by PT    Recommendations for Other Services       Precautions / Restrictions Precautions Precautions: Fall Precaution Comments: continues to have insidious fulctuations in hypersomnia and AMS Restrictions Weight Bearing Restrictions: No     Mobility  Bed Mobility Overal bed mobility: Needs Assistance Bed Mobility: Supine to Sit, Sit to Supine     Supine to sit: Min assist Sit to supine: Min assist        Transfers Overall transfer level: Needs assistance Equipment used: Rolling walker (2 wheels) Transfers: Sit to/from Stand Sit to Stand: Max assist                Ambulation/Gait Ambulation/Gait assistance:  (unsafe to attempt at present)                 SMarine scientistRankin (Stroke Patients Only)       Balance                                            Cognition  Exercises Other Exercises Other Exercises: STS from elevated EOB, RW, minA; high level effort required with multiple false starts, declining mentation, lengthy recovery interval needed between reps. (Son Logan in room providing additional encouragement)    General Comments        Pertinent Vitals/Pain Pain Assessment Pain Assessment: 0-10 Pain Score: 6  Pain Location: back pain Pain Intervention(s): Limited activity within patient's tolerance, Monitored during session, Premedicated before session, Repositioned    Home Living                          Prior Function            PT  Goals (current goals can now be found in the care plan section) Acute Rehab PT Goals PT Goal Formulation: With patient/family Progress towards PT goals: Progressing toward goals    Frequency    Min 2X/week      PT Plan Current plan remains appropriate    Co-evaluation              AM-PAC PT "6 Clicks" Mobility   Outcome Measure  Help needed turning from your back to your side while in a flat bed without using bedrails?: A Lot Help needed moving from lying on your back to sitting on the side of a flat bed without using bedrails?: A Lot Help needed moving to and from a bed to a chair (including a wheelchair)?: A Lot Help needed standing up from a chair using your arms (e.g., wheelchair or bedside chair)?: A Lot Help needed to walk in hospital room?: Total Help needed climbing 3-5 steps with a railing? : Total 6 Click Score: 10    End of Session   Activity Tolerance: Patient tolerated treatment well;Patient limited by fatigue Patient left: in bed;with bed alarm set;with call bell/phone within reach;with family/visitor present Nurse Communication:  (mentation) PT Visit Diagnosis: Other symptoms and signs involving the nervous system (R29.898);Other abnormalities of gait and mobility (R26.89)     Time: 2395-3202 PT Time Calculation (min) (ACUTE ONLY): 23 min  Charges:  $Therapeutic Exercise: 23-37 mins                    4:02 PM, 06/28/22 Etta Grandchild, PT, DPT Physical Therapist - [facility]  [phone] ([place])   Willi Borowiak C 06/28/2022, 3:58 PM

## 2022-06-29 ENCOUNTER — Inpatient Hospital Stay: Payer: Medicare PPO

## 2022-06-29 DIAGNOSIS — R4182 Altered mental status, unspecified: Secondary | ICD-10-CM

## 2022-06-29 DIAGNOSIS — I160 Hypertensive urgency: Secondary | ICD-10-CM | POA: Diagnosis not present

## 2022-06-29 LAB — GLUCOSE, CAPILLARY
Glucose-Capillary: 246 mg/dL — ABNORMAL HIGH (ref 70–99)
Glucose-Capillary: 204 mg/dL — ABNORMAL HIGH (ref 70–99)
Glucose-Capillary: 322 mg/dL — ABNORMAL HIGH (ref 70–99)
Glucose-Capillary: 265 mg/dL — ABNORMAL HIGH (ref 70–99)

## 2022-06-29 LAB — TSH: TSH: 2.384 u[IU]/mL (ref 0.350–4.500)

## 2022-06-29 LAB — PHENOBARBITAL LEVEL: Phenobarbital: <5 ug/mL — ABNORMAL LOW (ref 15.0–40.0)

## 2022-06-29 LAB — VITAMIN B12: Vitamin B-12: 382 pg/mL (ref 180–914)

## 2022-06-29 LAB — AMMONIA: Ammonia: 26 umol/L (ref 9–35)

## 2022-06-29 MED ORDER — PRIMIDONE 50 MG PO TABS
25.0000 mg | ORAL_TABLET | Freq: Two times a day (BID) | ORAL | Status: DC
  Administered 2022-06-29 – 2022-07-01 (×4): 25 mg via ORAL
  Filled 2022-06-29 (×4): qty 0.5

## 2022-06-29 MED ORDER — INSULIN GLARGINE-YFGN 100 UNIT/ML ~~LOC~~ SOLN
4.0000 [IU] | Freq: Every day | SUBCUTANEOUS | Status: DC
  Administered 2022-06-29: 4 [IU] via SUBCUTANEOUS
  Filled 2022-06-29 (×2): qty 0.04

## 2022-06-29 MED ORDER — GABAPENTIN 100 MG PO CAPS
200.0000 mg | ORAL_CAPSULE | Freq: Three times a day (TID) | ORAL | Status: DC
  Administered 2022-06-29 – 2022-07-01 (×6): 200 mg via ORAL
  Filled 2022-06-29 (×6): qty 2

## 2022-06-29 NOTE — Progress Notes (Signed)
Eeg done 

## 2022-06-29 NOTE — Consult Note (Addendum)
Neurology Consultation  Reason for Consult: Fluctuating mental status Referring Physician: Dr. Dwyane Dee, hospitalist  CC: Fluctuating mental status  History is obtained from: Chart, patient, care team  HPI: Linda Bradford is a 66 y.o. female past medical history of hypertension, COPD, diabetes, hypothyroidism, chronic back pain on multiple pain medication, depression, benign essential tremor, restless leg syndrome, sleep apnea, concern for cognitive impairment in the setting of polypharmacy at baseline, admitted on 06/23/2022 with complaints of back pain and was noted to be in hypertensive urgency secondary to nonadherence to medications at home. Her blood pressures have been controlled, acute on chronic back pain being managed.  Her other morbidities of COPD diabetes and hypothyroidism also are being managed in the hospital without any acute exacerbations.  She has been having spells where she keeps falling asleep while speaking with the care team.  I was called by phone for a curbside consultation yesterday by the hospitalist and he had specific questions on Namenda, which could increase somnolence.  I recommended either half or discontinuing the dose, which was done yesterday but she continues to have the same episodes. She reports that she has been having a lot of trouble with multiple issues but most concerning her back pain.  She does not complain of any chest pain shortness of breath.  She has been on pain medications including gabapentin for many years.  Her home dosages of gabapentin are listed as 900 in the a.m. and 1500 at night. Neurological consultation obtained for the waxing and waning mentation  ROS: Full ROS was performed and is negative except as noted in the HPI.   Past Medical History:  Diagnosis Date   Anemia    Anxiety    Arthritis    Cancer ([place])    skin cancer, removed   COPD (chronic obstructive pulmonary disease) ([place])    Depression    Diabetes mellitus without  complication ([place])    Dyspnea    Dysrhythmia    occasional pvc's. not being treated.   Fatigue    GERD (gastroesophageal reflux disease)    Headache    Heart murmur    Hypertension    Hypothyroidism    Pneumonia    Thyroid disease    Tremors of nervous system      Family History  Problem Relation Age of Onset   Lung cancer Mother    Alcohol abuse Sister    Arthritis Sister    Alcohol abuse Brother    Heart disease Brother 42   Heart attack Brother    Pancreatic cancer Maternal Aunt    Diabetes Maternal Aunt    Alcohol abuse Son    Drug abuse Son    Drug abuse Son    Heart disease Father 27   Diabetes Sister    Breast cancer Brother    Alcohol abuse Maternal Uncle    Alcohol abuse Maternal Grandmother    Diabetes Maternal Grandmother      Social History:   reports that she has been smoking cigarettes. She started smoking about 42 years ago. She has a 90.00 pack-year smoking history. She has never used smokeless tobacco. She reports that she does not drink alcohol and does not use drugs.  Medications  Current Facility-Administered Medications:    acetaminophen (TYLENOL) tablet 650 mg, 650 mg, Oral, Q6H PRN, 650 mg at 06/28/22 0616 **OR** acetaminophen (TYLENOL) suppository 650 mg, 650 mg, Rectal, Q6H PRN, Opyd, Timothy S, MD   albuterol (PROVENTIL) (2.5 MG/3ML) 0.083% nebulizer solution 2.5 mg,  2.5 mg, Inhalation, Q4H PRN, Opyd, Ilene Qua, MD   amLODipine (NORVASC) tablet 10 mg, 10 mg, Oral, Daily, Shawna Clamp, MD, 10 mg at 06/29/22 0902   aspirin chewable tablet 81 mg, 81 mg, Oral, BID, Opyd, Ilene Qua, MD, 81 mg at 06/29/22 0902   bisacodyl (DULCOLAX) EC tablet 5 mg, 5 mg, Oral, Daily PRN, Opyd, Ilene Qua, MD, 5 mg at 06/29/22 1059   buPROPion (WELLBUTRIN XL) 24 hr tablet 150 mg, 150 mg, Oral, Daily, Opyd, Timothy S, MD, 150 mg at 06/29/22 0902   DULoxetine (CYMBALTA) DR capsule 60 mg, 60 mg, Oral, BID, Opyd, Ilene Qua, MD, 60 mg at 06/29/22 0904   enoxaparin  (LOVENOX) injection 50 mg, 0.5 mg/kg, Subcutaneous, Q24H, Opyd, Timothy S, MD, 50 mg at 06/28/22 2140   fluticasone furoate-vilanterol (BREO ELLIPTA) 200-25 MCG/ACT 1 puff, 1 puff, Inhalation, Daily, 1 puff at 06/29/22 0903 **AND** umeclidinium bromide (INCRUSE ELLIPTA) 62.5 MCG/ACT 1 puff, 1 puff, Inhalation, Daily, Opyd, Ilene Qua, MD, 1 puff at 06/29/22 0904   gabapentin (NEURONTIN) capsule 1,200 mg, 1,200 mg, Oral, QHS, Shawna Clamp, MD, 1,200 mg at 06/28/22 2140   guaiFENesin-dextromethorphan (ROBITUSSIN DM) 100-10 MG/5ML syrup 5 mL, 5 mL, Oral, Q4H PRN, Shawna Clamp, MD, 5 mL at 06/28/22 0615   hydrALAZINE (APRESOLINE) tablet 50 mg, 50 mg, Oral, Q8H, Shawna Clamp, MD, 50 mg at 06/29/22 4097   insulin aspart (novoLOG) injection 0-5 Units, 0-5 Units, Subcutaneous, QHS, Kumar, Pardeep, MD   insulin aspart (novoLOG) injection 0-9 Units, 0-9 Units, Subcutaneous, TID WC, Shawna Clamp, MD, 3 Units at 06/29/22 1221   insulin glargine-yfgn (SEMGLEE) injection 4 Units, 4 Units, Subcutaneous, QHS, Kumar, Pardeep, MD   labetalol (NORMODYNE) injection 10 mg, 10 mg, Intravenous, Q2H PRN, Opyd, Ilene Qua, MD, 10 mg at 06/25/22 1158   levothyroxine (SYNTHROID) tablet 75 mcg, 75 mcg, Oral, Q0600, Opyd, Ilene Qua, MD, 75 mcg at 06/29/22 0620   metoprolol succinate (TOPROL-XL) 24 hr tablet 50 mg, 50 mg, Oral, Daily, Opyd, Timothy S, MD, 50 mg at 06/29/22 0901   Muscle Rub CREA 1 Application, 1 Application, Topical, PRN, Shawna Clamp, MD, 1 Application at 35/32/99 0616   nicotine (NICODERM CQ - dosed in mg/24 hr) patch 7 mg, 7 mg, Transdermal, Q0600, Opyd, Ilene Qua, MD, 7 mg at 06/29/22 0620   nicotine polacrilex (NICORETTE) gum 2 mg, 2 mg, Oral, Q2H PRN, Opyd, Ilene Qua, MD   ondansetron (ZOFRAN) tablet 4 mg, 4 mg, Oral, Q6H PRN **OR** ondansetron (ZOFRAN) injection 4 mg, 4 mg, Intravenous, Q6H PRN, Opyd, Ilene Qua, MD, 4 mg at 06/25/22 0857   oxybutynin (DITROPAN) tablet 5 mg, 5 mg, Oral, QHS, Beers,  Brandon D, RPH, 5 mg at 06/28/22 1711   oxyCODONE (Oxy IR/ROXICODONE) immediate release tablet 5 mg, 5 mg, Oral, Q6H PRN, Shawna Clamp, MD   pantoprazole (PROTONIX) EC tablet 40 mg, 40 mg, Oral, Daily, Coulter, Carolyn, RPH, 40 mg at 06/29/22 2426   primidone (MYSOLINE) tablet 50 mg, 50 mg, Oral, BID, Shawna Clamp, MD, 50 mg at 06/29/22 0905   rosuvastatin (CRESTOR) tablet 10 mg, 10 mg, Oral, Daily, Opyd, Timothy S, MD, 10 mg at 06/29/22 0901   senna-docusate (Senokot-S) tablet 1 tablet, 1 tablet, Oral, QHS PRN, Opyd, Ilene Qua, MD, 1 tablet at 06/28/22 1059   sodium chloride flush (NS) 0.9 % injection 3 mL, 3 mL, Intravenous, Q12H, Opyd, Ilene Qua, MD, 3 mL at 06/29/22 0906  Exam: Current vital signs: BP 100/67 (BP Location: Left Arm)  Pulse 91   Temp (!) 97.5 F (36.4 C) (Oral)   Resp 19   Ht '5\' 8"'$  (1.727 m)   Wt 97.9 kg   SpO2 96%   BMI 32.82 kg/m  Vital signs in last 24 hours: Temp:  [97.5 F (36.4 C)-98.6 F (37 C)] 97.5 F (36.4 C) (11/29 1215) Pulse Rate:  [87-110] 91 (11/29 1215) Resp:  [19-22] 19 (11/29 1215) BP: (100-167)/(58-84) 100/67 (11/29 1215) SpO2:  [94 %-100 %] 96 % (11/29 1215) General: The patient was awake when I saw her but kept dozing off during the course of the encounter. HEENT: Normocephalic atraumatic Lungs: Scattered rales CVS: Regular rhythm Abdomen nondistended nontender Neurological exam She is awake for the most part but keeps dozing off in the middle. Has diminished attention concentration No dysarthria No aphasia Cranial nerves II to XII intact Motor examination with no drift in the upper extremities but does have what appears to be asterixis or negative myoclonus on outstretched palms bilaterally.  Bilateral lower extremities are antigravity with mild drift in both secondary to pain. Sensation intact to light touch Coordination with no dysmetria   Labs I have reviewed labs in epic and the results pertinent to this consultation  are:  CBC    Component Value Date/Time   WBC 8.6 06/26/2022 1823   RBC 5.31 (H) 06/26/2022 1823   HGB 16.3 (H) 06/26/2022 1823   HGB 14.8 09/27/2021 0950   HCT 46.9 (H) 06/26/2022 1823   HCT 44.2 09/27/2021 0950   PLT 281 06/26/2022 1823   PLT 300 09/27/2021 0950   MCV 88.3 06/26/2022 1823   MCV 93 09/27/2021 0950   MCH 30.7 06/26/2022 1823   MCHC 34.8 06/26/2022 1823   RDW 14.5 06/26/2022 1823   RDW 14.3 09/27/2021 0950   LYMPHSABS 1.3 06/23/2022 1006   LYMPHSABS 1.9 04/06/2021 0846   MONOABS 0.5 06/23/2022 1006   EOSABS 0.2 06/23/2022 1006   EOSABS 0.2 04/06/2021 0846   BASOSABS 0.1 06/23/2022 1006   BASOSABS 0.1 04/06/2021 0846    CMP     Component Value Date/Time   NA 135 06/26/2022 1823   NA 136 12/07/2021 1528   K 3.7 06/26/2022 1823   CL 102 06/26/2022 1823   CO2 25 06/26/2022 1823   GLUCOSE 217 (H) 06/26/2022 1823   BUN 40 (H) 06/26/2022 1823   BUN 13 12/07/2021 1528   CREATININE 0.62 06/26/2022 1823   CREATININE 0.58 05/19/2017 0823   CALCIUM 9.7 06/26/2022 1823   PROT 7.8 06/23/2022 1827   PROT 6.8 09/02/2021 1447   ALBUMIN 4.1 06/23/2022 1827   ALBUMIN 4.4 09/02/2021 1447   AST 21 06/23/2022 1827   ALT 19 06/23/2022 1827   ALKPHOS 91 06/23/2022 1827   BILITOT 1.0 06/23/2022 1827   BILITOT <0.2 09/02/2021 1447   GFRNONAA >60 06/26/2022 1823   GFRNONAA 99 05/19/2017 0823   GFRAA >60 07/06/2019 0320   GFRAA 115 05/19/2017 0823    Lipid Panel     Component Value Date/Time   CHOL 169 12/07/2021 1528   TRIG 149 12/07/2021 1528   HDL 60 12/07/2021 1528   CHOLHDL 2.8 12/07/2021 1528   CHOLHDL 4.3 05/19/2017 0823   LDLCALC 83 12/07/2021 1528   LDLCALC 167 (H) 05/19/2017 0823     Imaging I have reviewed the images obtained:  CT-head-no acute changes  Assessment: 66 year old with multiple comorbidities listed above admitted for back pain and hypertensive urgency having spells of waxing and waning mentation.  She appears  to be exhibiting poor  attention concentration in between her spells of having conversations with me.  On examination she has mild asterixis on bilateral arms which has been documented before but could also be exacerbated by gabapentin. I suspect polypharmacy might be playing a big role in her current clinical picture. Her Namenda was stopped yesterday. I would recommend changes to gabapentin as well as primidone further. That said, I would ensure that reversible causes of altered mental status are checked and we are not missing underlying seizures versus strokes that could also be responsible for her mentation  Impression: Multifactorial toxic metabolic encephalopathy Polypharmacy Evaluate for seizure Evaluate for stroke Evaluate for reversible causes of altered mental status and memory loss  Recommendations: EEG MRI brain without contrast B12 TSH Ammonia level Toxicology screen Decrease gabapentin further.  I will change gabapentin to 200 mg p.o. 3 times daily She is on primidone which metabolizes to phenobarbital.  Check phenobarbital level  Reduce phenobarbital to 25 twice daily. Has history of sleep apnea not sure if using CPAP.  I will check a blood gas to make sure she is not retaining CO2. Plan discussed with Dr. Dwyane Dee  -- Amie Portland, MD Neurologist [facility] Pager: [phone]

## 2022-06-29 NOTE — Progress Notes (Signed)
PROGRESS NOTE    Linda Bradford  TKP:[id number] DOB: [birth year] DOA: 06/23/2022  PCP: [name], FNP   Brief Narrative:  This 66 years old female with PMH significant for hypertension, type 2 diabetes, COPD, hypothyroidism, chronic back pain presented in the ED with complaint of worsening back pain.  Patient reports she has been doing a lot more physical activity than usual recently while working in her garage and went on to develop severe worsening in her chronic back pain.  She was seen in the ED and pain was improved with Toradol and Lidoderm. CT head was unremarkable and patient was discharged home.  She returned back with worsening low back pain. She is following up with orthopedic surgery as outpatient and MRI is planned once insurance authorizes.  Patient was found to be hypertensive on arrival with systolic BP 517/001. CTA chest/abdomen/pelvis negative for acute findings.  Patient is admitted for hypertensive urgency.  Assessment & Plan:   Principal Problem:   Hypertensive urgency Active Problems:   Bronchitis, chronic (HCC)   Adult hypothyroidism   Type 2 diabetes mellitus with hyperglycemia, without long-term current use of insulin (HCC)   Chronic pain syndrome   Acute exacerbation of chronic low back pain  Hypertensive urgency: > Improving Patient was found to be severely hypertensive with SBP above 207/107 associated with acute back pain and nonadherence with antihypertensive medications. There is no evidence of end organ injury.  Home blood pressure medications resumed that included metoprolol ,  Continue amlodipine 10 mg daily, Increase hydralazine 50 mg 3 times daily. Continue IV labetalol as needed. BP is improving, continue to monitor.  Acute on chronic back pain: CT in the ED showed mild spinal stenosis but there was concerning more severe stenosis at lateral recesses and foramina of L3 L5. There is no weakness or change in bowel or bladder habits noted. Patient is  following up with [facility] for this and waiting on insurance authorization for MRI. Continue adequate pain control, follow-up outpatient as planned. PT and OT recommended SNF  Acute encephalopathy : Likely secondary to polypharmacy. CT head no acute abnormality.  Ammonia level 17, VBG shows mildly elevated PCO2. Patient is fully awake in the morning as the day progresses she becomes lethargic. Medication adjusted.  Gabapentin, Cymbalta , skelexin doses reduced. Neurology consulted, agreed for differential diagnosis MRI and EEG is ordered. Neuro follow up  COPD:  Not in acute exacerbation on admission,  Continue home inhalers.  Type 2 diabetes: Hemoglobin A1c 6.6.  Carb modified diet.   Continue regular sliding scale.  Hypothyroidism: Continue Synthroid.  DVT prophylaxis: Lovenox Code Status:Full code Family Communication: No family at bed side. Disposition Plan:   Status is: Inpatient Remains inpatient appropriate because: Patient admitted for hypertensive urgency.  Also found to have acute on chronic back pain. Now developed acute encephalopathy likely secondary to polypharmacy.  Medication dose adjusted.  Neurology recommended MRI and EEG.  PT and OT recommended SNF.   Consultants:  None  Procedures: CT abdomen / pelvis / chest Antimicrobials: None  Subjective: Patient was seen and examined at bedside.  Overnight events noted. She reports feeling much better, She is awake, following commands. She has periods where she goes to sleep while talking  Blood pressure is improving.   Objective: Vitals:   06/28/22 2352 06/29/22 0502 06/29/22 0726 06/29/22 1215  BP: (!) 158/65 107/65 (!) 167/70 100/67  Pulse: (!) 110 (!) 109 (!) 110 91  Resp: '20 20 20 19  '$ Temp: 98.6 F (  37 C) 98.1 F (36.7 C) 97.6 F (36.4 C) (!) 97.5 F (36.4 C)  TempSrc: Oral  Oral Oral  SpO2: 94% 100% 100% 96%  Weight:      Height:        Intake/Output Summary (Last 24 hours) at  06/29/2022 1443 Last data filed at 06/29/2022 1218 Gross per 24 hour  Intake 240 ml  Output 1375 ml  Net -1135 ml   Filed Weights   06/23/22 1804 06/25/22 0830  Weight: 99.8 kg 97.9 kg    Examination:  General exam: Appears comfortable, not in any acute distress, deconditioned. Respiratory system: CTA bilaterally, respiratory effort normal, RR 15 Cardiovascular system: S1-S2 heard, regular rate and rhythm, no murmur. Gastrointestinal system: Abdomen is soft, mildly tender, nondistended, BS+ Central nervous system: Alert, oriented x 3, no focal neurological deficits. Back: Low back tenderness noted, limited ROM Extremities: No edema, no cyanosis, no clubbing Skin: No rashes, lesions or ulcers Psychiatry: Judgement and insight appear normal. Mood & affect appropriate.     Data Reviewed: I have personally reviewed following labs and imaging studies  CBC: Recent Labs  Lab 06/23/22 1006 06/23/22 1827 06/24/22 0506 06/26/22 1823  WBC 5.6 5.1 6.4 8.6  NEUTROABS 3.7  --   --   --   HGB 14.6 15.0 16.1* 16.3*  HCT 43.7 44.1 47.2* 46.9*  MCV 90.1 89.1 89.1 88.3  PLT 241 236 232 967   Basic Metabolic Panel: Recent Labs  Lab 06/23/22 1006 06/23/22 1827 06/24/22 0506 06/26/22 1823  NA 130* 133* 136 135  K 4.5 4.1 4.2 3.7  CL 96* 96* 98 102  CO2 '26 27 26 25  '$ GLUCOSE 225* 185* 235* 217*  BUN '12 11 10 '$ 40*  CREATININE 0.62 0.51 0.47 0.62  CALCIUM 9.6 9.2 9.5 9.7  MG  --   --   --  2.5*  PHOS  --   --   --  2.6   GFR: Estimated Creatinine Clearance: 84.6 mL/min (by C-G formula based on SCr of 0.62 mg/dL). Liver Function Tests: Recent Labs  Lab 06/23/22 1827  AST 21  ALT 19  ALKPHOS 91  BILITOT 1.0  PROT 7.8  ALBUMIN 4.1   Recent Labs  Lab 06/23/22 1827  LIPASE 28   Recent Labs  Lab 06/28/22 0918  AMMONIA 17   Coagulation Profile: No results for input(s): "INR", "PROTIME" in the last 168 hours. Cardiac Enzymes: No results for input(s): "CKTOTAL",  "CKMB", "CKMBINDEX", "TROPONINI" in the last 168 hours. BNP (last 3 results) No results for input(s): "PROBNP" in the last 8760 hours. HbA1C: No results for input(s): "HGBA1C" in the last 72 hours. CBG: Recent Labs  Lab 06/28/22 1119 06/28/22 1602 06/28/22 2028 06/29/22 0728 06/29/22 1217  GLUCAP 225* 240* 196* 246* 204*   Lipid Profile: No results for input(s): "CHOL", "HDL", "LDLCALC", "TRIG", "CHOLHDL", "LDLDIRECT" in the last 72 hours. Thyroid Function Tests: No results for input(s): "TSH", "T4TOTAL", "FREET4", "T3FREE", "THYROIDAB" in the last 72 hours. Anemia Panel: No results for input(s): "VITAMINB12", "FOLATE", "FERRITIN", "TIBC", "IRON", "RETICCTPCT" in the last 72 hours. Sepsis Labs: Recent Labs  Lab 06/23/22 1828  LATICACIDVEN 1.5    No results found for this or any previous visit (from the past 240 hour(s)).   Radiology Studies: CT HEAD WO CONTRAST (5MM)  Result Date: 06/28/2022 CLINICAL DATA:  Mental status change, unknown cause EXAM: CT HEAD WITHOUT CONTRAST TECHNIQUE: Contiguous axial images were obtained from the base of the skull through the vertex without  intravenous contrast. RADIATION DOSE REDUCTION: This exam was performed according to the departmental dose-optimization program which includes automated exposure control, adjustment of the mA and/or kV according to patient size and/or use of iterative reconstruction technique. COMPARISON:  12/28/2017 FINDINGS: Brain: There is periventricular white matter decreased attenuation consistent with small vessel ischemic changes. Gray-white differentiation is preserved. No acute intracranial hemorrhage, mass effect or shift. No hydrocephalus. Vascular: No hyperdense vessel or unexpected calcification. Skull: Normal. Negative for fracture or focal lesion. Sinuses/Orbits: No acute finding. IMPRESSION: Periventricular white matter changes consistent with chronic small vessel ischemia. No acute intracranial process  identified. Electronically Signed   By: Sammie Bench M.D.   On: 06/28/2022 09:45    Scheduled Meds:  amLODipine  10 mg Oral Daily   aspirin  81 mg Oral BID   buPROPion  150 mg Oral Daily   DULoxetine  60 mg Oral BID   enoxaparin (LOVENOX) injection  0.5 mg/kg Subcutaneous Q24H   fluticasone furoate-vilanterol  1 puff Inhalation Daily   And   umeclidinium bromide  1 puff Inhalation Daily   gabapentin  200 mg Oral TID   hydrALAZINE  50 mg Oral Q8H   insulin aspart  0-5 Units Subcutaneous QHS   insulin aspart  0-9 Units Subcutaneous TID WC   insulin glargine-yfgn  4 Units Subcutaneous QHS   levothyroxine  75 mcg Oral Q0600   metoprolol succinate  50 mg Oral Daily   nicotine  7 mg Transdermal Q0600   oxybutynin  5 mg Oral QHS   pantoprazole  40 mg Oral Daily   primidone  25 mg Oral BID   rosuvastatin  10 mg Oral Daily   sodium chloride flush  3 mL Intravenous Q12H   Continuous Infusions:   LOS: 5 days    Time spent: 35 mins    Alexsandria Kivett, MD [facility]   If 7PM-7AM, please contact night-coverage

## 2022-06-29 NOTE — Procedures (Signed)
Patient Name: Linda Bradford  MRN: [id number]  Epilepsy Attending: Lora Havens  Referring Physician/Provider: Amie Portland, MD  Date: 06/29/2022 Duration: 26.55 mins  Patient history: 66 year old with multiple comorbidities listed above admitted for back pain and hypertensive urgency having spells of waxing and waning mentation. EEG to evaluate for seizure  Level of alertness: Awake  AEDs during EEG study: GBP  Technical aspects: This EEG study was done with scalp electrodes positioned according to the 10-20 International system of electrode placement. Electrical activity was reviewed with band pass filter of 1-'70Hz'$ , sensitivity of 7 uV/mm, display speed of 32m/sec with a '60Hz'$  notched filter applied as appropriate. EEG data were recorded continuously and digitally stored.  Video monitoring was available and reviewed as appropriate.  Description: The posterior dominant rhythm consists of 7.5 Hz activity of moderate voltage (25-35 uV) seen predominantly in posterior head regions, symmetric and reactive to eye opening and eye closing. EEG showed continuous generalized 5 to 7 Hz theta slowing.Hyperventilation and photic stimulation were not performed.     ABNORMALITY - Continuous slow, generalized  IMPRESSION: This study is suggestive of mild to moderate diffuse encephalopathy, nonspecific etiology. No seizures or epileptiform discharges were seen throughout the recording.  Keairra Bardon OBarbra Sarks

## 2022-06-29 NOTE — Progress Notes (Signed)
Occupational Therapy Treatment Patient Details Name: Linda Bradford MRN: [id number] DOB: 10/28/1955 Today's Date: 06/29/2022   History of present illness Linda Bradford is a 51yoF comes to [facility] 11/23 c progressive back pain. Patient was found to be hypertensive on arrival 207/127mHg. PMH: HTN, COPD, hypoTSH, chronic LBP, s/p R TKA on 01/10/22. CT in the ED showed mild spinal stenosis but there was concerning more severe stenosis at lateral recesses and foramina of L3 L5.   OT comments  Upon entering session, pt resting in bed and agreeable to OT. Son present at beginning of session, but then stepping outside of room. Pt required CGA-Min A for bed mobility, Mod A to stand from EOB and BSC, and CGA-Min A to take several steps from EOB<>BSC using RW. Pt continues to require multimodal cues for initiation, sequencing, safety awareness, and problem solving during ADL tasks and OOB mobility. Pt left as received with all needs in reach. D/C recommendation remains appropriate. OT will continue to follow acutely.    Recommendations for follow up therapy are one component of a multi-disciplinary discharge planning process, led by the attending physician.  Recommendations may be updated based on patient status, additional functional criteria and insurance authorization.    Follow Up Recommendations  Skilled nursing-short term rehab (<3 hours/day)     Assistance Recommended at Discharge Frequent or constant Supervision/Assistance  Patient can return home with the following  Direct supervision/assist for medications management;Direct supervision/assist for financial management;Assistance with feeding;Assistance with cooking/housework;Assist for transportation;Help with stairs or ramp for entrance;A lot of help with walking and/or transfers;A lot of help with bathing/dressing/bathroom   Equipment Recommendations  Other (comment) (defer to next venue of care)    Recommendations for Other Services       Precautions / Restrictions Precautions Precautions: Fall Restrictions Weight Bearing Restrictions: No       Mobility Bed Mobility Overal bed mobility: Needs Assistance Bed Mobility: Supine to Sit, Sit to Supine     Supine to sit: Min assist Sit to supine: Min guard        Transfers Overall transfer level: Needs assistance Equipment used: Rolling walker (2 wheels) Transfers: Sit to/from Stand Sit to Stand: Mod assist           General transfer comment: STS from EOB and BSC     Balance Overall balance assessment: Needs assistance Sitting-balance support: Feet supported Sitting balance-Leahy Scale: Good     Standing balance support: Bilateral upper extremity supported, Reliant on assistive device for balance Standing balance-Leahy Scale: Fair                             ADL either performed or assessed with clinical judgement   ADL Overall ADL's : Needs assistance/impaired     Grooming: Wash/dry face;Brushing hair;Set up;Sitting                   Toilet Transfer: Ambulation;BSC/3in1;Rolling walker (2 wheels);Moderate assistance;Cueing for safety;Cueing for sequencing Toilet Transfer Details (indicate cue type and reason): Mod A to stand from BDarbyand Hygiene: Set up;Minimal assistance;Sitting/lateral lean;Sit to/from stand Toileting - Clothing Manipulation Details (indicate cue type and reason): pt able to assist with posterior hygiene in sitting with set up A, Min A for thoroughness in standing     Functional mobility during ADLs: Min guard;Minimal assistance;Rolling walker (2 wheels);Cueing for safety;Cueing for sequencing (to take several steps from EOB<>BSC, Min A intermittently for RW management and balance  2/2 unsteadiness)      Extremity/Trunk Assessment Upper Extremity Assessment Upper Extremity Assessment: Generalized weakness   Lower Extremity Assessment Lower Extremity Assessment: Generalized  weakness   Cervical / Trunk Assessment Cervical / Trunk Assessment: Normal    Vision Baseline Vision/History: 1 Wears glasses Patient Visual Report: No change from baseline     Perception     Praxis      Cognition Arousal/Alertness: Awake/alert Behavior During Therapy: Impulsive, Flat affect Overall Cognitive Status: Impaired/Different from baseline Area of Impairment: Orientation, Attention, Following commands, Safety/judgement, Awareness, Problem solving, Memory                 Orientation Level: Disoriented to, Time, Situation Current Attention Level: Focused Memory: Decreased short-term memory Following Commands: Follows one step commands inconsistently Safety/Judgement: Decreased awareness of safety, Decreased awareness of deficits   Problem Solving: Decreased initiation, Difficulty sequencing, Requires verbal cues, Requires tactile cues, Slow processing General Comments: VC for safety awareness, multimodal cues for sequencing and safe transfer techniques (hand placement)        Exercises      Shoulder Instructions       General Comments      Pertinent Vitals/ Pain       Pain Assessment Pain Assessment: Faces Faces Pain Scale: Hurts a little bit Pain Location: back pain Pain Descriptors / Indicators: Sore, Aching, Discomfort Pain Intervention(s): Limited activity within patient's tolerance, Monitored during session, Repositioned  Home Living                                          Prior Functioning/Environment              Frequency  Min 2X/week        Progress Toward Goals  OT Goals(current goals can now be found in the care plan section)  Progress towards OT goals: Progressing toward goals  Acute Rehab OT Goals Patient Stated Goal: go home OT Goal Formulation: With patient Time For Goal Achievement: 07/11/22 Potential to Achieve Goals: [place] Discharge plan remains appropriate;Frequency remains appropriate     Co-evaluation                 AM-PAC OT "6 Clicks" Daily Activity     Outcome Measure   Help from another person eating meals?: A Little Help from another person taking care of personal grooming?: A Little Help from another person toileting, which includes using toliet, bedpan, or urinal?: A Lot Help from another person bathing (including washing, rinsing, drying)?: A Lot Help from another person to put on and taking off regular upper body clothing?: A Little Help from another person to put on and taking off regular lower body clothing?: A Lot 6 Click Score: 15    End of Session Equipment Utilized During Treatment: Gait belt;Rolling walker (2 wheels)  OT Visit Diagnosis: Unsteadiness on feet (R26.81);Muscle weakness (generalized) (M62.81);Other symptoms and signs involving cognitive function   Activity Tolerance Patient tolerated treatment well;Patient limited by fatigue   Patient Left in bed;with call bell/phone within reach;with bed alarm set   Nurse Communication Mobility status        Time: 1535-1600 OT Time Calculation (min): 25 min  Charges: OT General Charges $OT Visit: 1 Visit OT Treatments $Self Care/Home Management : 23-37 mins  [facility] MS, OTR/L ascom [phone]  06/29/22, 6:11 PM

## 2022-06-29 NOTE — Progress Notes (Signed)
Physical Therapy Treatment Patient Details Name: Linda Bradford MRN: [id number] DOB: 01-13-1956 Today's Date: 06/29/2022   History of Present Illness Linda Bradford is a 73yoF comes to [facility] 11/23 c progressive back pain. Patient was found to be hypertensive on arrival 207/163mHg. PMH: HTN, COPD, hypoTSH, chronic LBP, s/p R TKA on 01/10/22. CT in the ED showed mild spinal stenosis but there was concerning more severe stenosis at lateral recesses and foramina of L3 L5.    PT Comments    Limited session due to pt's cognition and lethargy.  She was intermittently able to verbalize and seemingly engage a little with PT but would devolve to either blank stare, falling asleep or mumbling incoherently.  Able to occasionally wake/focus her enough to get a few reps of exercises in but most AROM was very limited and she needed direct AAROM to perform simple tasks.  AMS very limiting at this point, there are orders for MRI.  Will maintain on caseload and continue to attempt as able and appropriate.  Recommendations for follow up therapy are one component of a multi-disciplinary discharge planning process, led by the attending physician.  Recommendations may be updated based on patient status, additional functional criteria and insurance authorization.  Follow Up Recommendations  Skilled nursing-short term rehab (<3 hours/day) Can patient physically be transported by private vehicle: No   Assistance Recommended at Discharge Intermittent Supervision/Assistance  Patient can return home with the following Two people to help with bathing/dressing/bathroom;Two people to help with walking and/or transfers;Direct supervision/assist for financial management;Direct supervision/assist for medications management;Help with stairs or ramp for entrance;Assistance with cooking/housework;Assist for transportation   Equipment Recommendations  None recommended by PT    Recommendations for Other Services       Precautions /  Restrictions Precautions Precautions: Fall Restrictions Weight Bearing Restrictions: No     Mobility  Bed Mobility               General bed mobility comments: unable to stay awake long enough to make mobility a viable option    Transfers                        Ambulation/Gait                   Stairs             Wheelchair Mobility    Modified Rankin (Stroke Patients Only)       Balance                                            Cognition Arousal/Alertness: Lethargic Behavior During Therapy: Flat affect Overall Cognitive Status: Impaired/Different from baseline                                 General Comments: difficult to keep on task, awake, generally showing poor awareness and consistency        Exercises General Exercises - Lower Extremity Ankle Circles/Pumps: AROM Heel Slides: AAROM, 10 reps (with lightly resisted leg ext per pt tolerance)    General Comments        Pertinent Vitals/Pain Pain Assessment Pain Location: struggled to answer questions consistently, did not c/o pain (back or otherwise) with limited activity    Home Living  Prior Function            PT Goals (current goals can now be found in the care plan section) Progress towards PT goals: Not progressing toward goals - comment (mentation)    Frequency    Min 2X/week      PT Plan Current plan remains appropriate    Co-evaluation              AM-PAC PT "6 Clicks" Mobility   Outcome Measure  Help needed turning from your back to your side while in a flat bed without using bedrails?: A Lot Help needed moving from lying on your back to sitting on the side of a flat bed without using bedrails?: Total Help needed moving to and from a bed to a chair (including a wheelchair)?: Total Help needed standing up from a chair using your arms (e.g., wheelchair or bedside chair)?:  Total Help needed to walk in hospital room?: Total Help needed climbing 3-5 steps with a railing? : Total 6 Click Score: 7    End of Session   Activity Tolerance: Patient limited by lethargy Patient left: with bed alarm set;with call bell/phone within reach   PT Visit Diagnosis: Other symptoms and signs involving the nervous system (R29.898);Other abnormalities of gait and mobility (R26.89)     Time: 1720-1730 PT Time Calculation (min) (ACUTE ONLY): 10 min  Charges:  $Therapeutic Exercise: 8-22 mins                     Kreg Shropshire, DPT 06/29/2022, 6:27 PM

## 2022-06-29 NOTE — Progress Notes (Signed)
PASRR number received: [id number] Laure Kidney, MSW, [place]

## 2022-06-30 DIAGNOSIS — G928 Other toxic encephalopathy: Secondary | ICD-10-CM

## 2022-06-30 DIAGNOSIS — G8929 Other chronic pain: Secondary | ICD-10-CM | POA: Diagnosis not present

## 2022-06-30 DIAGNOSIS — E871 Hypo-osmolality and hyponatremia: Secondary | ICD-10-CM | POA: Insufficient documentation

## 2022-06-30 DIAGNOSIS — E876 Hypokalemia: Secondary | ICD-10-CM | POA: Insufficient documentation

## 2022-06-30 DIAGNOSIS — M545 Low back pain, unspecified: Secondary | ICD-10-CM | POA: Diagnosis not present

## 2022-06-30 DIAGNOSIS — I160 Hypertensive urgency: Secondary | ICD-10-CM | POA: Diagnosis not present

## 2022-06-30 LAB — MAGNESIUM: Magnesium: 2.3 mg/dL (ref 1.7–2.4)

## 2022-06-30 LAB — BASIC METABOLIC PANEL
Anion gap: 10 (ref 5–15)
BUN: 42 mg/dL — ABNORMAL HIGH (ref 8–23)
CO2: 21 mmol/L — ABNORMAL LOW (ref 22–32)
Calcium: 9.3 mg/dL (ref 8.9–10.3)
Chloride: 100 mmol/L (ref 98–111)
Creatinine, Ser: 0.61 mg/dL (ref 0.44–1.00)
GFR, Estimated: >60 mL/min (ref >60)
Glucose, Bld: 208 mg/dL — ABNORMAL HIGH (ref 70–99)
Potassium: 3.3 mmol/L — ABNORMAL LOW (ref 3.5–5.1)
Sodium: 131 mmol/L — ABNORMAL LOW (ref 135–145)

## 2022-06-30 LAB — CBC
HCT: 44.6 % (ref 36.0–46.0)
Hemoglobin: 15.3 g/dL — ABNORMAL HIGH (ref 12.0–15.0)
MCH: 30.5 pg (ref 26.0–34.0)
MCHC: 34.3 g/dL (ref 30.0–36.0)
MCV: 88.8 fL (ref 80.0–100.0)
Platelets: 299 10*3/uL (ref 150–400)
RBC: 5.02 MIL/uL (ref 3.87–5.11)
RDW: 14.3 % (ref 11.5–15.5)
WBC: 11.5 10*3/uL — ABNORMAL HIGH (ref 4.0–10.5)
nRBC: 0 % (ref 0.0–0.2)

## 2022-06-30 LAB — GLUCOSE, CAPILLARY
Glucose-Capillary: 206 mg/dL — ABNORMAL HIGH (ref 70–99)
Glucose-Capillary: 222 mg/dL — ABNORMAL HIGH (ref 70–99)
Glucose-Capillary: 200 mg/dL — ABNORMAL HIGH (ref 70–99)
Glucose-Capillary: 281 mg/dL — ABNORMAL HIGH (ref 70–99)

## 2022-06-30 LAB — PHOSPHORUS: Phosphorus: 3.4 mg/dL (ref 2.5–4.6)

## 2022-06-30 MED ORDER — POTASSIUM CHLORIDE CRYS ER 20 MEQ PO TBCR
40.0000 meq | EXTENDED_RELEASE_TABLET | ORAL | Status: AC
  Administered 2022-06-30 (×2): 40 meq via ORAL
  Filled 2022-06-30 (×2): qty 2

## 2022-06-30 MED ORDER — INSULIN GLARGINE-YFGN 100 UNIT/ML ~~LOC~~ SOLN
10.0000 [IU] | Freq: Every day | SUBCUTANEOUS | Status: DC
  Administered 2022-06-30: 10 [IU] via SUBCUTANEOUS
  Filled 2022-06-30 (×2): qty 0.1

## 2022-06-30 MED ORDER — SENNOSIDES-DOCUSATE SODIUM 8.6-50 MG PO TABS
2.0000 | ORAL_TABLET | Freq: Two times a day (BID) | ORAL | Status: DC
  Administered 2022-06-30 – 2022-07-01 (×2): 2 via ORAL
  Filled 2022-06-30 (×2): qty 2

## 2022-06-30 MED ORDER — MEMANTINE HCL 5 MG PO TABS
5.0000 mg | ORAL_TABLET | Freq: Two times a day (BID) | ORAL | Status: DC
  Administered 2022-06-30 – 2022-07-01 (×3): 5 mg via ORAL
  Filled 2022-06-30 (×3): qty 1

## 2022-06-30 NOTE — Progress Notes (Addendum)
  Progress Note   Patient: Linda Bradford TIR:[id number] DOB: [birth year] DOA: 06/23/2022     6 DOS: the patient was seen and examined on 06/30/2022   Brief hospital course: This 66 years old female with PMH significant for hypertension, type 2 diabetes, COPD, hypothyroidism, chronic back pain presented in the ED with complaint of worsening back pain.  Patient reports she has been doing a lot more physical activity than usual recently while working in her garage and went on to develop severe worsening in her chronic back pain.  She was seen in the ED and pain was improved with Toradol and Lidoderm. CT head was unremarkable and patient was discharged home.  She returned back with worsening low back pain. She is following up with orthopedic surgery as outpatient and MRI is planned once insurance authorizes.  Patient was found to be hypertensive on arrival with systolic BP 676/195. CTA chest/abdomen/pelvis negative for acute findings.  Patient is admitted for hypertensive urgency.   Assessment and Plan: Hypertension urgency. Appears to be secondary to severe pain, agitation.  Condition has improved  Acute metabolic encephalopathy. Seen by neurology, condition appears to be secondary to medications.  Medication doses adjusted.  Acute on chronic back pain. Patient will be followed by orthopedics as outpatient.  Hyponatremia. Hypokalemia. Replete potassium, recheck levels tomorrow.  COPD Stable.  Type 2 diabetes Continue sliding scale insulin.  Generalized weakness. Patient has been seen by PT/OT, recommended nursing home placement.  Currently pending for insurance authorization.      Subjective:  Patient still complaining of back pain,.  She is also complaining lower abdominal pain, constipation.  Physical Exam: Vitals:   06/30/22 0717 06/30/22 1020 06/30/22 1103 06/30/22 1145  BP: (!) 142/65   (!) 127/49  Pulse: (!) 102   79  Resp: '18 18 18 16  '$ Temp: 97.7 F (36.5 C)   97.6 F  (36.4 C)  TempSrc:      SpO2: 94%   98%  Weight:      Height:       General exam: Appears calm and comfortable  Respiratory system: Clear to auscultation. Respiratory effort normal. Cardiovascular system: S1 & S2 heard, RRR. No JVD, murmurs, rubs, gallops or clicks. No pedal edema. Gastrointestinal system: Abdomen is nondistended, soft and nontender. No organomegaly or masses felt. Normal bowel sounds heard. Central nervous system: Alert and oriented. No focal neurological deficits. Extremities: Symmetric 5 x 5 power. Skin: No rashes, lesions or ulcers Psychiatry: Judgement and insight appear normal. Mood & affect appropriate.   Data Reviewed:  Lab results reviewed.  Family Communication: Son updated at bedside.  Disposition: Status is: Inpatient Remains inpatient appropriate because: Unsafe discharge, pending nursing home placement.  Planned Discharge Destination: Skilled nursing facility    Time spent: 35 minutes  Author: Sharen Hones, MD 06/30/2022 3:34 PM  For on call review [email or web address].

## 2022-06-30 NOTE — Progress Notes (Signed)
Neurology Progress Note   S:// Seen and examined.  Comfortably sitting in bed and having breakfast Complains of a mild headache-received Tylenol for it.  O:// Current vital signs: BP (!) 142/65 (BP Location: Left Arm)   Pulse (!) 102   Temp 97.7 F (36.5 C)   Resp 18   Ht '5\' 8"'$  (1.727 m)   Wt 97.9 kg   SpO2 94%   BMI 32.82 kg/m  Vital signs in last 24 hours: Temp:  [97.5 F (36.4 C)-98.4 F (36.9 C)] 97.7 F (36.5 C) (11/30 0717) Pulse Rate:  [88-102] 102 (11/30 0717) Resp:  [18-20] 18 (11/30 0717) BP: (100-151)/(56-107) 142/65 (11/30 0717) SpO2:  [92 %-100 %] 94 % (11/30 0717) General: Awake alert in no distress HNT: Normocephalic, atraumatic Lungs: Clear Cardiovascular: Regular rate rhythm m Abdomen nondistended nontender Neurological exam Awake alert oriented x 3.  No aphasia. Cranial nerves II to XII-no deficits Motor examination with no deficits Sensory exam: Intact to light touch Coordination: There is mild tremor in both upper extremities on outstretched arms.  No frank dysmetria   Medications  Current Facility-Administered Medications:    acetaminophen (TYLENOL) tablet 650 mg, 650 mg, Oral, Q6H PRN, 650 mg at 06/30/22 0727 **OR** acetaminophen (TYLENOL) suppository 650 mg, 650 mg, Rectal, Q6H PRN, Opyd, Timothy S, MD   albuterol (PROVENTIL) (2.5 MG/3ML) 0.083% nebulizer solution 2.5 mg, 2.5 mg, Inhalation, Q4H PRN, Opyd, Ilene Qua, MD   amLODipine (NORVASC) tablet 10 mg, 10 mg, Oral, Daily, Shawna Clamp, MD, 10 mg at 06/30/22 0849   aspirin chewable tablet 81 mg, 81 mg, Oral, BID, Opyd, Ilene Qua, MD, 81 mg at 06/30/22 0849   bisacodyl (DULCOLAX) EC tablet 5 mg, 5 mg, Oral, Daily PRN, Opyd, Ilene Qua, MD, 5 mg at 06/29/22 1059   buPROPion (WELLBUTRIN XL) 24 hr tablet 150 mg, 150 mg, Oral, Daily, Opyd, Timothy S, MD, 150 mg at 06/30/22 0849   DULoxetine (CYMBALTA) DR capsule 60 mg, 60 mg, Oral, BID, Opyd, Timothy S, MD, 60 mg at 06/30/22 0849   enoxaparin  (LOVENOX) injection 50 mg, 0.5 mg/kg, Subcutaneous, Q24H, Opyd, Timothy S, MD, 50 mg at 06/29/22 2209   fluticasone furoate-vilanterol (BREO ELLIPTA) 200-25 MCG/ACT 1 puff, 1 puff, Inhalation, Daily, 1 puff at 06/30/22 0854 **AND** umeclidinium bromide (INCRUSE ELLIPTA) 62.5 MCG/ACT 1 puff, 1 puff, Inhalation, Daily, Opyd, Ilene Qua, MD, 1 puff at 06/30/22 0854   gabapentin (NEURONTIN) capsule 200 mg, 200 mg, Oral, TID, Amie Portland, MD, 200 mg at 06/30/22 0849   guaiFENesin-dextromethorphan (ROBITUSSIN DM) 100-10 MG/5ML syrup 5 mL, 5 mL, Oral, Q4H PRN, Shawna Clamp, MD, 5 mL at 06/28/22 0615   hydrALAZINE (APRESOLINE) tablet 50 mg, 50 mg, Oral, Q8H, Shawna Clamp, MD, 50 mg at 06/30/22 0505   insulin aspart (novoLOG) injection 0-5 Units, 0-5 Units, Subcutaneous, QHS, Shawna Clamp, MD, 3 Units at 06/29/22 2209   insulin aspart (novoLOG) injection 0-9 Units, 0-9 Units, Subcutaneous, TID WC, Shawna Clamp, MD, 3 Units at 06/30/22 0848   insulin glargine-yfgn (SEMGLEE) injection 4 Units, 4 Units, Subcutaneous, QHS, Shawna Clamp, MD, 4 Units at 06/29/22 2210   labetalol (NORMODYNE) injection 10 mg, 10 mg, Intravenous, Q2H PRN, Opyd, Ilene Qua, MD, 10 mg at 06/25/22 1158   levothyroxine (SYNTHROID) tablet 75 mcg, 75 mcg, Oral, Q0600, Opyd, Ilene Qua, MD, 75 mcg at 06/30/22 0505   metoprolol succinate (TOPROL-XL) 24 hr tablet 50 mg, 50 mg, Oral, Daily, Opyd, Ilene Qua, MD, 50 mg at 06/30/22 [phone]  Muscle Rub CREA 1 Application, 1 Application, Topical, PRN, Shawna Clamp, MD, 1 Application at 36/64/40 0616   nicotine (NICODERM CQ - dosed in mg/24 hr) patch 7 mg, 7 mg, Transdermal, Q0600, Opyd, Ilene Qua, MD, 7 mg at 06/30/22 0506   nicotine polacrilex (NICORETTE) gum 2 mg, 2 mg, Oral, Q2H PRN, Opyd, Ilene Qua, MD   ondansetron (ZOFRAN) tablet 4 mg, 4 mg, Oral, Q6H PRN **OR** ondansetron (ZOFRAN) injection 4 mg, 4 mg, Intravenous, Q6H PRN, Opyd, Timothy S, MD, 4 mg at 06/25/22 0857   oxybutynin  (DITROPAN) tablet 5 mg, 5 mg, Oral, QHS, Beers, Shanon Brow, RPH, 5 mg at 06/29/22 2206   oxyCODONE (Oxy IR/ROXICODONE) immediate release tablet 5 mg, 5 mg, Oral, Q6H PRN, Shawna Clamp, MD   pantoprazole (PROTONIX) EC tablet 40 mg, 40 mg, Oral, Daily, Coulter, Carolyn, RPH, 40 mg at 06/30/22 0849   potassium chloride SA (KLOR-CON M) CR tablet 40 mEq, 40 mEq, Oral, Q2H, Sharen Hones, MD, 40 mEq at 06/30/22 0849   primidone (MYSOLINE) tablet 25 mg, 25 mg, Oral, BID, Amie Portland, MD, 25 mg at 06/30/22 0849   rosuvastatin (CRESTOR) tablet 10 mg, 10 mg, Oral, Daily, Opyd, Ilene Qua, MD, 10 mg at 06/30/22 0849   senna-docusate (Senokot-S) tablet 1 tablet, 1 tablet, Oral, QHS PRN, Opyd, Ilene Qua, MD, 1 tablet at 06/28/22 1059   sodium chloride flush (NS) 0.9 % injection 3 mL, 3 mL, Intravenous, Q12H, Opyd, Ilene Qua, MD, 3 mL at 06/29/22 2211 Labs CBC    Component Value Date/Time   WBC 11.5 (H) 06/30/2022 0559   RBC 5.02 06/30/2022 0559   HGB 15.3 (H) 06/30/2022 0559   HGB 14.8 09/27/2021 0950   HCT 44.6 06/30/2022 0559   HCT 44.2 09/27/2021 0950   PLT 299 06/30/2022 0559   PLT 300 09/27/2021 0950   MCV 88.8 06/30/2022 0559   MCV 93 09/27/2021 0950   MCH 30.5 06/30/2022 0559   MCHC 34.3 06/30/2022 0559   RDW 14.3 06/30/2022 0559   RDW 14.3 09/27/2021 0950   LYMPHSABS 1.3 06/23/2022 1006   LYMPHSABS 1.9 04/06/2021 0846   MONOABS 0.5 06/23/2022 1006   EOSABS 0.2 06/23/2022 1006   EOSABS 0.2 04/06/2021 0846   BASOSABS 0.1 06/23/2022 1006   BASOSABS 0.1 04/06/2021 0846    CMP     Component Value Date/Time   NA 131 (L) 06/30/2022 0559   NA 136 12/07/2021 1528   K 3.3 (L) 06/30/2022 0559   CL 100 06/30/2022 0559   CO2 21 (L) 06/30/2022 0559   GLUCOSE 208 (H) 06/30/2022 0559   BUN 42 (H) 06/30/2022 0559   BUN 13 12/07/2021 1528   CREATININE 0.61 06/30/2022 0559   CREATININE 0.58 05/19/2017 0823   CALCIUM 9.3 06/30/2022 0559   PROT 7.8 06/23/2022 1827   PROT 6.8 09/02/2021 1447    ALBUMIN 4.1 06/23/2022 1827   ALBUMIN 4.4 09/02/2021 1447   AST 21 06/23/2022 1827   ALT 19 06/23/2022 1827   ALKPHOS 91 06/23/2022 1827   BILITOT 1.0 06/23/2022 1827   BILITOT <0.2 09/02/2021 1447   GFRNONAA >60 06/30/2022 0559   GFRNONAA 99 05/19/2017 0823   GFRAA >60 07/06/2019 0320   GFRAA 115 05/19/2017 0823   lipid Panel     Component Value Date/Time   CHOL 169 12/07/2021 1528   TRIG 149 12/07/2021 1528   HDL 60 12/07/2021 1528   CHOLHDL 2.8 12/07/2021 1528   CHOLHDL 4.3 05/19/2017 0823   LDLCALC 83  12/07/2021 1528   LDLCALC 167 (H) 05/19/2017 0823   B12 382 TSH 2.3 ammonia 26 phenobarbital level undetectable  Neurodiagnostics: EEG: Generalized slowing   Imaging I have reviewed images in epic and the results pertinent to this consultation are: MRI of the brain: No acute intracranial process  Assessment: 66 year old with multiple comorbidities admitted for back pain and hypertensive urgency with waxing and waning spells of her mentation for which neurological consultation was obtained.  No focal deficits on exam yesterday or today.  Today appears to be much more awake than yesterday after sedating medications were lowered. I suspect polypharmacy as a cause for her waxing and waning mentation.  She was on large doses of gabapentin in addition to being on opiates and other sedating medications as well as Namenda. EEG with no evidence of seizures.  MRI with no evidence of structural defect. She is on primidone.  She is getting primidone in the hospital.  Level is undetectable-and this is fine.  I wanted to make sure she is not toxic on phenobarbital and that phenobarbital was not contributing to her encephalopathy.  Impression:  Toxic metabolic encephalopathy likely secondary to polypharmacy with medications including opiates, gabapentin.  Recommendations: Continue to minimize sedating medications. Continue gabapentin 200 mg 3 times daily Primidone was reduced to  25 twice daily.  Continue this dose. Namenda was held over the past few days.  I would recommend resuming Namenda at 5 mg twice daily.  I would not escalate the dose any further inpatient. May need outpatient sleep study. Symptomatic management of headache as you are. Patient neurology follow-up in 8 to 12 weeks.  Plan relayed to Dr. Roosevelt Locks via secure chat   -- Amie Portland, MD Neurologist [facility] Pager: [phone]

## 2022-06-30 NOTE — Hospital Course (Signed)
This 66 years old female with PMH significant for hypertension, type 2 diabetes, COPD, hypothyroidism, chronic back pain presented in the ED with complaint of worsening back pain.  Patient reports she has been doing a lot more physical activity than usual recently while working in her garage and went on to develop severe worsening in her chronic back pain.  She was seen in the ED and pain was improved with Toradol and Lidoderm. CT head was unremarkable and patient was discharged home.  She returned back with worsening low back pain. She is following up with orthopedic surgery as outpatient and MRI is planned once insurance authorizes.  Patient was found to be hypertensive on arrival with systolic BP 906/893. CTA chest/abdomen/pelvis negative for acute findings.  Patient is admitted for hypertensive urgency.

## 2022-06-30 NOTE — TOC Progression Note (Signed)
Transition of Care [facility]) - Progression Note    Patient Details  Name: Linda Bradford MRN: [id number] Date of Birth: [birth year]  Transition of Care [facility]) CM/SW [place], [place] Phone Number: 06/30/2022, 2:00 PM  Clinical Narrative:      CSW met with patient at bedside, discussed bed offer of [place]. She is in agreement.   CSW has notified Tanya with [facility] of acceptance of bed offer. Insurance Josem Kaufmann has been started today for [employer].       Expected Discharge Plan and Services                                                 Social Determinants of Health (SDOH) Interventions    Readmission Risk Interventions     No data to display

## 2022-06-30 NOTE — Inpatient Diabetes Management (Signed)
Inpatient Diabetes Program Recommendations  AACE/ADA: New Consensus Statement on Inpatient Glycemic Control (2015)  Target Ranges:  Prepandial:   less than 140 mg/dL      Peak postprandial:   less than 180 mg/dL (1-2 hours)      Critically ill patients:  140 - 180 mg/dL    Latest Reference Range & Units 06/29/22 07:28 06/29/22 12:17 06/29/22 16:21 06/29/22 20:34  Glucose-Capillary 70 - 99 mg/dL 246 (H)  3 units Novolog '@0901'$  204 (H)  3 units Novolog  322 (H)  7 units Novolog  265 (H)  3 units Novolog '@2209'$   4 units Semglee '@2210'$   (H): Data is abnormally high  Latest Reference Range & Units 06/30/22 07:18 06/30/22 11:46  Glucose-Capillary 70 - 99 mg/dL 206 (H)  3 units Novolog '@0848'$  222 (H)  3 units Novolog   (H): Data is abnormally high    Home DM Meds: Metformin XR 1000 mg daily    Current Orders: Semglee 4 units QHS  Novolog 0-9 units TID ac/hs    Started Semglee last PM   CBGs remain >200 today  MD- Please consider increasing the Semglee insulin to 8 units QHS (0.075 units/kg)    --Will follow patient during hospitalization--  Wyn Quaker RN, MSN, [place] Diabetes Coordinator Inpatient Glycemic Control Team Team Pager: [phone] (8a-5p)

## 2022-06-30 NOTE — Care Management Important Message (Signed)
Important Message  Patient Details  Name: Roda Lauture MRN: [id number] Date of Birth: Jul 10, 1956   Medicare Important Message Given:  Yes     Dannette Barbara 06/30/2022, 2:30 PM

## 2022-07-01 DIAGNOSIS — M545 Low back pain, unspecified: Secondary | ICD-10-CM | POA: Diagnosis not present

## 2022-07-01 DIAGNOSIS — E871 Hypo-osmolality and hyponatremia: Secondary | ICD-10-CM

## 2022-07-01 DIAGNOSIS — I160 Hypertensive urgency: Secondary | ICD-10-CM | POA: Diagnosis not present

## 2022-07-01 DIAGNOSIS — E1165 Type 2 diabetes mellitus with hyperglycemia: Secondary | ICD-10-CM | POA: Diagnosis not present

## 2022-07-01 LAB — BASIC METABOLIC PANEL
Anion gap: 9 (ref 5–15)
BUN: 34 mg/dL — ABNORMAL HIGH (ref 8–23)
CO2: 21 mmol/L — ABNORMAL LOW (ref 22–32)
Calcium: 9.1 mg/dL (ref 8.9–10.3)
Chloride: 100 mmol/L (ref 98–111)
Creatinine, Ser: 0.58 mg/dL (ref 0.44–1.00)
GFR, Estimated: >60 mL/min (ref >60)
Glucose, Bld: 188 mg/dL — ABNORMAL HIGH (ref 70–99)
Potassium: 4.2 mmol/L (ref 3.5–5.1)
Sodium: 130 mmol/L — ABNORMAL LOW (ref 135–145)

## 2022-07-01 LAB — MAGNESIUM: Magnesium: 2.1 mg/dL (ref 1.7–2.4)

## 2022-07-01 LAB — GLUCOSE, CAPILLARY
Glucose-Capillary: 365 mg/dL — ABNORMAL HIGH (ref 70–99)
Glucose-Capillary: 196 mg/dL — ABNORMAL HIGH (ref 70–99)

## 2022-07-01 MED ORDER — HYDRALAZINE HCL 50 MG PO TABS: 50.0000 mg | ORAL_TABLET | Freq: Three times a day (TID) | ORAL | Status: DC

## 2022-07-01 MED ORDER — INSULIN GLARGINE 100 UNIT/ML ~~LOC~~ SOLN: 12.0000 [IU] | Freq: Every day | SUBCUTANEOUS | 11 refills | Status: DC

## 2022-07-01 MED ORDER — OXYCODONE HCL 5 MG PO TABS: 5.0000 mg | ORAL_TABLET | Freq: Four times a day (QID) | ORAL | 0 refills | Status: DC | PRN

## 2022-07-01 MED ORDER — SENNOSIDES-DOCUSATE SODIUM 8.6-50 MG PO TABS: 2.0000 | ORAL_TABLET | Freq: Two times a day (BID) | ORAL | Status: DC

## 2022-07-01 MED ORDER — PRIMIDONE 50 MG PO TABS: 25.0000 mg | ORAL_TABLET | Freq: Two times a day (BID) | ORAL | Status: AC

## 2022-07-01 MED ORDER — SODIUM CHLORIDE 1 G PO TABS
1.0000 g | ORAL_TABLET | Freq: Two times a day (BID) | ORAL | Status: DC
  Administered 2022-07-01: 1 g via ORAL
  Filled 2022-07-01: qty 1

## 2022-07-01 MED ORDER — AMLODIPINE BESYLATE 10 MG PO TABS: 10.0000 mg | ORAL_TABLET | Freq: Every day | ORAL | Status: DC

## 2022-07-01 MED ORDER — SODIUM BICARBONATE 650 MG PO TABS: 650.0000 mg | ORAL_TABLET | Freq: Two times a day (BID) | ORAL | 0 refills | Status: AC

## 2022-07-01 MED ORDER — INSULIN ASPART 100 UNIT/ML IJ SOLN: 6.0000 [IU] | Freq: Three times a day (TID) | INTRAMUSCULAR | 0 refills | Status: DC

## 2022-07-01 MED ORDER — GABAPENTIN 100 MG PO CAPS: 200.0000 mg | ORAL_CAPSULE | Freq: Three times a day (TID) | ORAL | Status: DC

## 2022-07-01 MED ORDER — SODIUM CHLORIDE 1 G PO TABS: 1.0000 g | ORAL_TABLET | Freq: Two times a day (BID) | ORAL | Status: DC

## 2022-07-01 NOTE — Progress Notes (Addendum)
Physical Therapy Treatment Patient Details Name: Linda Bradford MRN: [id number] DOB: 12/11/1955 Today's Date: 07/01/2022   History of Present Illness Linda Bradford is a 66yoF comes [facility] 11/23 c progressive back pain. Patient was found to be hypertensive on arrival 207/127mHg. PMH: HTN, COPD, hypoTSH, chronic LBP, s/p R TKA on 01/10/22. CT in the ED showed mild spinal stenosis but there was concerning more severe stenosis at lateral recesses and foramina of L3 L5.    PT Comments    Pt found supine in bed upon PT/OT co-treatment with c/o back pain. Pt required min assist x1 for bed mobility. Sit<>Stand with mod-max assist x2 and RW. Stand pivot transfer to the chair RW and CGA. Sit<>Stand repeated with mod assist x2 and RW. Pt ambulated 867fwith RW and CGA and, after a seated rest break, was able to walk another 47f29fith RW and CGA. Pt also able to tolerate seated level exercises without increase in pain. Visible tremors noted throughout mobility and verbal/tactile cuing required throughout. Pt would benefit from skilled physical therapy to address the listed deficits (see below) to increase independence with ADLs and function.       Recommendations for follow up therapy are one component of a multi-disciplinary discharge planning process, led by the attending physician.  Recommendations may be updated based on patient status, additional functional criteria and insurance authorization.  Follow Up Recommendations  Skilled nursing-short term rehab (<3 hours/day) Can patient physically be transported by private vehicle: No   Assistance Recommended at Discharge Intermittent Supervision/Assistance  Patient can return home with the following Two people to help with bathing/dressing/bathroom;Two people to help with walking and/or transfers;Direct supervision/assist for financial management;Direct supervision/assist for medications management;Help with stairs or ramp for entrance;Assistance with  cooking/housework;Assist for transportation   Equipment Recommendations  None recommended by PT    Recommendations for Other Services       Precautions / Restrictions Precautions Precautions: Fall Restrictions Weight Bearing Restrictions: No     Mobility  Bed Mobility Overal bed mobility: Needs Assistance Bed Mobility: Supine to Sit     Supine to sit: Min assist          Transfers Overall transfer level: Needs assistance Equipment used: Rolling walker (2 wheels) Transfers: Sit to/from Stand, Bed to chair/wheelchair/BSC Sit to Stand: Mod assist, Max assist, +2 physical assistance Stand pivot transfers: Min guard         General transfer comment: mod to max x2 assist    Ambulation/Gait Ambulation/Gait assistance: Min guard Gait Distance (Feet): 12 Feet (8ft31fut and 47ft 36ft with a seated rest break between) Assistive device: Rolling walker (2 wheels) Gait Pattern/deviations: Trunk flexed, Narrow base of support, Shuffle       General Gait Details: reliant on AD for balance and anxiety limiting mobility   Stairs             Wheelchair Mobility    Modified Rankin (Stroke Patients Only)       Balance Overall balance assessment: Needs assistance Sitting-balance support: Feet supported Sitting balance-Leahy Scale: Good     Standing balance support: Bilateral upper extremity supported, Reliant on assistive device for balance Standing balance-Leahy Scale: Fair                              Cognition Arousal/Alertness: Awake/alert Behavior During Therapy: WFL for tasks assessed/performed Overall Cognitive Status: Impaired/Different from baseline  General Comments: difficult to keep on task and requires verbal and tactile cues for safety with RW        Exercises General Exercises - Lower Extremity Long Arc Quad: AROM, 5 reps, Right, Left, Seated Hip Flexion/Marching: 5 reps, Right,  Left, AROM, Seated    General Comments        Pertinent Vitals/Pain Pain Assessment Faces Pain Scale: Hurts little more Pain Location: R hip, lower back Pain Descriptors / Indicators: Sore, Aching, Discomfort Pain Intervention(s): Monitored during session, Repositioned    Home Living                          Prior Function            PT Goals (current goals can now be found in the care plan section) Acute Rehab PT Goals PT Goal Formulation: With patient Progress towards PT goals: Progressing toward goals    Frequency    Min 2X/week      PT Plan Current plan remains appropriate    Co-evaluation PT/OT/SLP Co-Evaluation/Treatment: Yes Reason for Co-Treatment: Necessary to address cognition/behavior during functional activity PT goals addressed during session: Mobility/safety with mobility;Balance;Proper use of DME;Strengthening/ROM OT goals addressed during session: ADL's and self-care;Proper use of Adaptive equipment and DME;Strengthening/ROM      AM-PAC PT "6 Clicks" Mobility   Outcome Measure  Help needed turning from your back to your side while in a flat bed without using bedrails?: A Little Help needed moving from lying on your back to sitting on the side of a flat bed without using bedrails?: A Little Help needed moving to and from a bed to a chair (including a wheelchair)?: A Lot Help needed standing up from a chair using your arms (e.g., wheelchair or bedside chair)?: A Lot Help needed to walk in hospital room?: A Lot Help needed climbing 3-5 steps with a railing? : Total 6 Click Score: 13    End of Session Equipment Utilized During Treatment: Gait belt Activity Tolerance: Patient limited by fatigue Patient left: in chair;with call bell/phone within reach;with chair alarm set   PT Visit Diagnosis: Other symptoms and signs involving the nervous system (R29.898);Other abnormalities of gait and mobility (R26.89)     Time: 4270-6237 PT Time  Calculation (min) (ACUTE ONLY): 26 min  Charges:  $Therapeutic Activity: 8-22 mins                    [employer], SPT  07/01/2022, 12:44 PM

## 2022-07-01 NOTE — Discharge Summary (Signed)
Physician Discharge Summary   Patient: Linda Bradford MRN: [id number] DOB: Aug 26, 1955  Admit date:     06/23/2022  Discharge date: 07/01/22  Discharge Physician: Sharen Hones   PCP: [name], FNP   Recommendations at discharge:   Repeat a BMP in 1 week. Follow-up with PCP in 1 week. Follow-up with orthopedics in 2 weeks.  Discharge Diagnoses: Principal Problem:   Hypertensive urgency Active Problems:   Bronchitis, chronic (HCC)   Adult hypothyroidism   Obesity (BMI 30-39.9)   Type 2 diabetes mellitus with hyperglycemia, without long-term current use of insulin (HCC)   Chronic pain syndrome   Acute exacerbation of chronic low back pain   Hypokalemia   Hyponatremia Mild metabolic acidosis. Resolved Problems:   * No resolved hospital problems. [facility] Course: This 66 years old female with PMH significant for hypertension, type 2 diabetes, COPD, hypothyroidism, chronic back pain presented in the ED with complaint of worsening back pain.  Patient reports she has been doing a lot more physical activity than usual recently while working in her garage and went on to develop severe worsening in her chronic back pain.  She was seen in the ED and pain was improved with Toradol and Lidoderm. CT head was unremarkable and patient was discharged home.  She returned back with worsening low back pain. She is following up with orthopedic surgery as outpatient and MRI is planned once insurance authorizes.  Patient was found to be hypertensive on arrival with systolic BP 952/841. CTA chest/abdomen/pelvis negative for acute findings.  Patient is admitted for hypertensive urgency.   Assessment and Plan:  Hypertension urgency. Medicine adjusted, blood pressure is better.  Follow-up with PCP in rehab facility.   Acute metabolic encephalopathy. Seen by neurology, condition appears to be secondary to medications.  Medication doses adjusted.   Acute on chronic back pain. Patient will be  followed by orthopedics as outpatient.   Hyponatremia. Hypokalemia. Mild metabolic acidosis. Potassium normalized, sodium level still low, added salt tablets.  Recheck a BMP in 1 week.  Patient also advised to not drink excess amounts of water. Patient has mild metabolic acidosis with bicarb 21, anion gap normal, prescribed 3 days of sodium bicarbonate.  Please recheck a BMP in 1 week time.   COPD Stable.   Uncontrolled type 2 diabetes with hyperglycemia. Glucose running high, started on Lantus and NovoLog.  Will continue.  A1c sent out, pending results.  Please check on it.   Generalized weakness. Patient has been seen by PT/OT, recommended nursing home placement.  Currently pending for insurance authorization.       Consultants: None Procedures performed: None  Disposition: Skilled nursing facility Diet recommendation:  Discharge Diet Orders (From admission, onward)     Start     Ordered   07/01/22 0000  Diet - low sodium heart healthy        07/01/22 1041           Cardiac diet DISCHARGE MEDICATION: Allergies as of 07/01/2022       Reactions   Dexilant [dexlansoprazole] Nausea Only, Other (See Comments)   Dizziness        Medication List     STOP taking these medications    fluconazole 150 MG tablet Commonly known as: DIFLUCAN   ibuprofen 600 MG tablet Commonly known as: ADVIL   metaxalone 800 MG tablet Commonly known as: SKELAXIN   moxifloxacin 400 MG tablet Commonly known as: AVELOX   Na Sulfate-K Sulfate-Mg Sulf 17.5-3.13-1.6 GM/177ML  Soln       TAKE these medications    acetaminophen 500 MG tablet Commonly known as: TYLENOL Take 1,000 mg by mouth every 6 (six) hours as needed for mild pain or moderate pain.   aerochamber plus with mask inhaler   albuterol 108 (90 Base) MCG/ACT inhaler Commonly known as: VENTOLIN HFA INHALE 2 PUFFS INTO THE LUNGS EVERY 4 HOURS AS NEEDED FOR WHEEZING OR SHORTNESS OF BREATH   amLODipine 10 MG  tablet Commonly known as: NORVASC Take 1 tablet (10 mg total) by mouth daily. Start taking on: July 02, 2022   aspirin 81 MG chewable tablet Chew 1 tablet (81 mg total) by mouth 2 (two) times daily.   benzonatate 200 MG capsule Commonly known as: TESSALON TAKE 1 CAPSULE BY MOUTH 3 TIMES DAILY ASNEEDED   buPROPion 150 MG 24 hr tablet Commonly known as: WELLBUTRIN XL TAKE 1 TABLET BY MOUTH ONCE DAILY. *MAKEAPPOINTMENT FOR FURTHER FILLS*   diclofenac 50 MG EC tablet Commonly known as: VOLTAREN   diclofenac Sodium 1 % Gel Commonly known as: VOLTAREN   DULoxetine 60 MG capsule Commonly known as: CYMBALTA TAKE 1 CAPSULE BY MOUTH TWICE DAILY   fluticasone 50 MCG/ACT nasal spray Commonly known as: FLONASE USE 2 SPRAYS IN EACH NOSTRIL ONCE DAILY   gabapentin 100 MG capsule Commonly known as: NEURONTIN Take 2 capsules (200 mg total) by mouth 3 (three) times daily. What changed:  medication strength See the new instructions.   glucose blood test strip Commonly known as: Contour Next Test To check blood sugar once daily   hydrALAZINE 50 MG tablet Commonly known as: APRESOLINE Take 1 tablet (50 mg total) by mouth every 8 (eight) hours.   insulin aspart 100 UNIT/ML injection Commonly known as: NovoLOG Inject 6 Units into the skin 3 (three) times daily with meals.   insulin glargine 100 UNIT/ML injection Commonly known as: Lantus Inject 0.12 mLs (12 Units total) into the skin at bedtime.   ipratropium-albuterol 0.5-2.5 (3) MG/3ML Soln Commonly known as: DUONEB Take 3 mLs by nebulization every 6 (six) hours as needed.   levothyroxine 75 MCG tablet Commonly known as: SYNTHROID Take 1 tablet (75 mcg total) by mouth daily before breakfast.   loperamide 2 MG tablet Commonly known as: IMODIUM A-D Take 2-4 mg by mouth 4 (four) times daily as needed for diarrhea or loose stools.   meloxicam 15 MG tablet Commonly known as: MOBIC Take 1 tablet (15 mg total) by mouth  daily.   metFORMIN 500 MG 24 hr tablet Commonly known as: GLUCOPHAGE-XR TAKE 2 TABLETS BY MOUTH ONCE A DAY WITH SUPPER. Please schedule office visit before any future refill.   metoprolol succinate 50 MG 24 hr tablet Commonly known as: TOPROL-XL TAKE 1 TABLET BY MOUTH ONCE DAILY WITH OR IMMEDIATELY FOLLOWING A MEAL What changed: See the new instructions.   nicotine 21 mg/24hr patch Commonly known as: NICODERM CQ - dosed in mg/24 hours Place 1 patch (21 mg total) onto the skin daily.   nicotine polacrilex 2 MG lozenge Commonly known as: Nicotine Mini Take 1 lozenge (2 mg total) by mouth every 2 (two) hours as needed for smoking cessation.   nystatin 100000 UNIT/ML suspension Commonly known as: MYCOSTATIN Take 5 mLs (500,000 Units total) by mouth 4 (four) times daily.   nystatin powder Commonly known as: MYCOSTATIN/NYSTOP Apply 1 application topically 3 (three) times daily.   oxybutynin 5 MG 24 hr tablet Commonly known as: DITROPAN-XL Take 1 tablet (5 mg total)  by mouth at bedtime.   oxyCODONE 5 MG immediate release tablet Commonly known as: Oxy IR/ROXICODONE Take 1 tablet (5 mg total) by mouth every 6 (six) hours as needed for moderate pain.   primidone 50 MG tablet Commonly known as: MYSOLINE Take 50 mg by mouth 2 (two) times daily.   rosuvastatin 10 MG tablet Commonly known as: Crestor Take 1 tablet (10 mg total) by mouth daily.   senna-docusate 8.6-50 MG tablet Commonly known as: Senokot-S Take 2 tablets by mouth 2 (two) times daily.   sodium chloride 1 g tablet Take 1 tablet (1 g total) by mouth 2 (two) times daily with a meal.   traZODone 100 MG tablet Commonly known as: DESYREL Take 2 tablets (200 mg total) by mouth at bedtime as needed for sleep. What changed: how much to take   Trelegy Ellipta 200-62.5-25 MCG/ACT Aepb Generic drug: Fluticasone-Umeclidin-Vilant Inhale 1 puff into the lungs 2 (two) times daily.   Vitamin D 50 MCG (2000 UT) tablet Take  2,000 Units by mouth daily.        Follow-up Information     Tally Joe T, FNP Follow up in 1 week(s).   Specialty: Family Medicine Contact information: [place] [address] [phone]         Lovell Sheehan, MD Follow up in 2 week(s).   Specialty: Orthopedic Surgery Contact information: [place] [address] [phone]                Discharge Exam: Danley Danker Weights   06/23/22 1804 06/25/22 0830 07/01/22 0402  Weight: 99.8 kg 97.9 kg 96.7 kg   General exam: Appears calm and comfortable  Respiratory system: Clear to auscultation. Respiratory effort normal. Cardiovascular system: S1 & S2 heard, RRR. No JVD, murmurs, rubs, gallops or clicks. No pedal edema. Gastrointestinal system: Abdomen is nondistended, soft and nontender. No organomegaly or masses felt. Normal bowel sounds heard. Central nervous system: Alert and oriented. No focal neurological deficits. Extremities: Symmetric 5 x 5 power. Skin: No rashes, lesions or ulcers Psychiatry: Judgement and insight appear normal. Mood & affect appropriate.    Condition at discharge: good  The results of significant diagnostics from this hospitalization (including imaging, microbiology, ancillary and laboratory) are listed below for reference.   Imaging Studies: MR BRAIN WO CONTRAST  Result Date: 06/29/2022 CLINICAL DATA:  Delirium EXAM: MRI HEAD WITHOUT CONTRAST TECHNIQUE: Multiplanar, multiecho pulse sequences of the brain and surrounding structures were obtained without intravenous contrast. COMPARISON:  None Available. FINDINGS: Brain: No acute infarct, mass effect or extra-axial collection. No acute or chronic hemorrhage. There is multifocal hyperintense T2-weighted signal within the white matter. Generalized volume loss. The midline structures are normal. Vascular: Major flow voids are preserved. Skull and upper cervical spine: Normal calvarium and skull base.  Visualized upper cervical spine and soft tissues are normal. Sinuses/Orbits:No paranasal sinus fluid levels or advanced mucosal thickening. No mastoid or middle ear effusion. Normal orbits. IMPRESSION: 1. No acute intracranial abnormality. 2. Findings of chronic small vessel ischemia and volume loss. Electronically Signed   By: Ulyses Jarred M.D.   On: 06/29/2022 22:07   EEG adult  Result Date: 06/29/2022 Lora Havens, MD     06/29/2022  5:42 PM Patient Name: Ahri Olson MRN: [id number] Epilepsy Attending: Lora Havens Referring Physician/Provider: Amie Portland, MD Date: 06/29/2022 Duration: 26.55 mins Patient history: 66 year old with multiple comorbidities listed above admitted for back pain and hypertensive urgency having spells of waxing and  waning mentation. EEG to evaluate for seizure Level of alertness: Awake AEDs during EEG study: GBP Technical aspects: This EEG study was done with scalp electrodes positioned according to the 10-20 International system of electrode placement. Electrical activity was reviewed with band pass filter of 1-'70Hz'$ , sensitivity of 7 uV/mm, display speed of 9m/sec with a '60Hz'$  notched filter applied as appropriate. EEG data were recorded continuously and digitally stored.  Video monitoring was available and reviewed as appropriate. Description: The posterior dominant rhythm consists of 7.5 Hz activity of moderate voltage (25-35 uV) seen predominantly in posterior head regions, symmetric and reactive to eye opening and eye closing. EEG showed continuous generalized 5 to 7 Hz theta slowing.Hyperventilation and photic stimulation were not performed.   ABNORMALITY - Continuous slow, generalized IMPRESSION: This study is suggestive of mild to moderate diffuse encephalopathy, nonspecific etiology. No seizures or epileptiform discharges were seen throughout the recording. Priyanka OBarbra Sarks  CT HEAD WO CONTRAST (5MM)  Result Date: 06/28/2022 CLINICAL DATA:  Mental status  change, unknown cause EXAM: CT HEAD WITHOUT CONTRAST TECHNIQUE: Contiguous axial images were obtained from the base of the skull through the vertex without intravenous contrast. RADIATION DOSE REDUCTION: This exam was performed according to the departmental dose-optimization program which includes automated exposure control, adjustment of the mA and/or kV according to patient size and/or use of iterative reconstruction technique. COMPARISON:  12/28/2017 FINDINGS: Brain: There is periventricular white matter decreased attenuation consistent with small vessel ischemic changes. Gray-white differentiation is preserved. No acute intracranial hemorrhage, mass effect or shift. No hydrocephalus. Vascular: No hyperdense vessel or unexpected calcification. Skull: Normal. Negative for fracture or focal lesion. Sinuses/Orbits: No acute finding. IMPRESSION: Periventricular white matter changes consistent with chronic small vessel ischemia. No acute intracranial process identified. Electronically Signed   By: JSammie BenchM.D.   On: 06/28/2022 09:45   CT Angio Chest/Abd/Pel for Dissection W and/or Wo Contrast  Result Date: 06/23/2022 CLINICAL DATA:  Acute aortic syndrome suspected. Hypertension with chest and abdominal pain. EXAM: CT ANGIOGRAPHY CHEST, ABDOMEN AND PELVIS TECHNIQUE: Non-contrast CT of the chest was initially obtained. Multidetector CT imaging through the chest, abdomen and pelvis was performed using the standard protocol during bolus administration of intravenous contrast. Multiplanar reconstructed images and MIPs were obtained and reviewed to evaluate the vascular anatomy. RADIATION DOSE REDUCTION: This exam was performed according to the departmental dose-optimization program which includes automated exposure control, adjustment of the mA and/or kV according to patient size and/or use of iterative reconstruction technique. CONTRAST:  1031mOMNIPAQUE IOHEXOL 350 MG/ML SOLN COMPARISON:  Chest CT  12/20/2021 FINDINGS: CTA CHEST FINDINGS Cardiovascular: The heart is normal in size. No pericardial effusion. Normal caliber thoracic aorta without dissection. Scattered atherosclerotic calcifications. Scattered coronary artery calcifications. The pulmonary arteries are grossly normal. Mediastinum/Nodes: No mediastinal or hilar mass or adenopathy or hematoma. The esophagus is unremarkable. The thyroid gland is normal. Lungs/Pleura: Stable mild emphysematous changes and pulmonary scarring. No acute pulmonary process. No worrisome pulmonary lesions or pulmonary nodules. Streaky subsegmental atelectasis at both lung bases. Musculoskeletal: No breast masses, supraclavicular or axillary adenopathy. The bony thorax is intact. Review of the MIP images confirms the above findings. CTA ABDOMEN AND PELVIS FINDINGS VASCULAR Aorta: Scattered atherosclerotic calcifications but no aneurysm or dissection. Celiac: Mild narrowing at the origin but no significant stenosis. No aneurysm or dissection. SMA: Minimal ostial calcifications but no stenosis. Renals: Ostial calcifications but no significant stenosis. IMA: Patent Inflow: Minimal atherosclerotic calcifications. No aneurysm dissection. Veins: No significant findings. Review  of the MIP images confirms the above findings. NON-VASCULAR Hepatobiliary: No hepatic lesions or intrahepatic biliary dilatation. The gallbladder is surgically absent. No common bile duct dilatation. Pancreas: Normal Spleen: Calcified granuloma but no worrisome lesions. Adrenals/Urinary Tract: The adrenal glands and kidneys are. The bladder is unremarkable. Mild cystocele is noted. Stomach/Bowel: The stomach, duodenum, small and colon unremarkable. No acute inflammatory process, mass lesions or obstructive findings. Advanced descending colon diverticulosis without findings for acute diverticulitis. Remote surgical changes involving the mid sigmoid colon. Lymphatic: No mesenteric or retroperitoneal mass or  adenopathy. Reproductive: The uterus and ovaries are unremarkable. Other: No pelvic mass or adenopathy. No free pelvic fluid collections. No inguinal mass or adenopathy. No abdominal wall hernia or subcutaneous lesions. Musculoskeletal: No significant bony findings. Review of the MIP images confirms the above findings. IMPRESSION: 1. Moderate atherosclerotic calcifications involving the abdominal aorta but no aneurysm or dissection. Minimal atherosclerotic calcifications involving the thoracic aorta. 2. No acute pulmonary process. 3. Stable mild emphysematous changes and pulmonary scarring. 4. Status post cholecystectomy without biliary dilatation. 5. Advanced descending colon diverticulosis without findings for acute diverticulitis. 6. Mild cystocele. 7. Aortic atherosclerosis. Aortic Atherosclerosis (ICD10-I70.0) and Emphysema (ICD10-J43.9). Electronically Signed   By: Marijo Sanes M.D.   On: 06/23/2022 19:55   CT Lumbar Spine Wo Contrast  Result Date: 06/23/2022 CLINICAL DATA:  66 year old female with low back pain, multiple falls yesterday. EXAM: CT LUMBAR SPINE WITHOUT CONTRAST TECHNIQUE: Multidetector CT imaging of the lumbar spine was performed without intravenous contrast administration. Multiplanar CT image reconstructions were also generated. RADIATION DOSE REDUCTION: This exam was performed according to the departmental dose-optimization program which includes automated exposure control, adjustment of the mA and/or kV according to patient size and/or use of iterative reconstruction technique. COMPARISON:  Lumbar MRI 09/09/2014. FINDINGS: Segmentation: Normal. Alignment: Lumbar lordosis appears stable since 2016 including mild chronic retrolisthesis at each of L2-L3, L3-L4, L4-L5. Vertebrae: Maintained T12 and lumbar vertebral height. Some osteopenia. Grossly intact visible sacrum and SI joints. No acute osseous abnormality identified. Paraspinal and other soft tissues: Surgically absent gallbladder.  Aortoiliac calcified atherosclerosis. Normal caliber abdominal aorta. Partially visible distal large bowel anastomosis series 4, image 121. Partially visible large bowel diverticulosis. Other visible noncontrast abdominal viscera appear negative. Lumbar paraspinal soft tissues are within normal limits. Disc levels: Lumbar spine degeneration with disc bulging and posterior element hypertrophy superimposed on chronic retrolisthesis does appear progressed since the 2016 MRI at multiple levels (right side vacuum facet now at L4-L5. However, suspect only mild spinal stenosis results. Possible more significant lateral recess stenosis at L2-L3 greater on the left (left L3 nerve level) and L4-L5 on the right (right L5 nerve level). And there is at least moderate right L4 and L5 foraminal stenosis. IMPRESSION: 1. No acute osseous abnormality in the lumbar spine. 2. Lumbar spine degeneration appears progressed since a 2016 MRI in association with multilevel chronic retrolisthesis. Only mild spinal stenosis suspected by CT, but more significant stenosis of the lateral recesses and/or foramina possible at the left L3, right L4 and right L5 nerve levels. Query associated radiculitis. 3.  Aortic Atherosclerosis (ICD10-I70.0). Electronically Signed   By: Genevie Ann M.D.   On: 06/23/2022 10:18    Microbiology: Results for orders placed or performed during the hospital encounter of 12/24/21  Surgical pcr screen     Status: None   Collection Time: 12/24/21  3:24 PM   Specimen: Nasal Mucosa; Nasal Swab  Result Value Ref Range Status   MRSA, PCR NEGATIVE NEGATIVE Final  Staphylococcus aureus NEGATIVE NEGATIVE Final    Comment: (NOTE) The Xpert SA Assay (FDA approved for NASAL specimens in patients 56 years of age and older), is one component of a comprehensive surveillance program. It is not intended to diagnose infection nor to guide or monitor treatment. Performed at [facility], [address]  [place], [place] [address]     Labs: CBC: Recent Labs  Lab 06/26/22 1823 06/30/22 0559  WBC 8.6 11.5*  HGB 16.3* 15.3*  HCT 46.9* 44.6  MCV 88.3 88.8  PLT 281 623   Basic Metabolic Panel: Recent Labs  Lab 06/26/22 1823 06/30/22 0559 07/01/22 0508  NA 135 131* 130*  K 3.7 3.3* 4.2  CL 102 100 100  CO2 25 21* 21*  GLUCOSE 217* 208* 188*  BUN 40* 42* 34*  CREATININE 0.62 0.61 0.58  CALCIUM 9.7 9.3 9.1  MG 2.5* 2.3 2.1  PHOS 2.6 3.4  --    Liver Function Tests: No results for input(s): "AST", "ALT", "ALKPHOS", "BILITOT", "PROT", "ALBUMIN" in the last 168 hours. CBG: Recent Labs  Lab 06/30/22 0718 06/30/22 1146 06/30/22 1650 06/30/22 2057 07/01/22 0738  GLUCAP 206* 222* 200* 281* 365*    Discharge time spent: greater than 30 minutes.  Signed: Sharen Hones, MD [facility] 07/01/2022

## 2022-07-01 NOTE — TOC Transition Note (Signed)
Transition of Care [facility]) - CM/SW Discharge Note   Patient Details  Name: Eldoris Beiser MRN: [id number] Date of Birth: 1956-01-22  [facility] of [facility] CM/SW Contact:  Gerilyn Pilgrim, LCSW Phone Number: 07/01/2022, 10:53 AM   Clinical Narrative:     Patient will DC to: [place] date: 07/01/2022 Transport by: Johnanna Schneiders  Per MD patient ready for DC to [facility] RN, patient, patient's family, and facility notified of DC. Discharge Summary sent to facility. RN given number for report [phone]. DC packet on chart. Ambulance transport requested for patient.  CSW signing off.    Final next level of care: Skilled Nursing Facility Barriers to Discharge: No Barriers Identified   Patient Goals and CMS Choice Patient states their goals for this hospitalization and ongoing recovery are:: To return home CMS Medicare.gov Compare Post Acute Care list provided to:: Patient Choice offered to / list presented to : Patient  Discharge Placement              Patient [facility] at[facility] Patient to be transferred to facility by: [facility]   Patient and family notified of of transfer: 07/01/22  Discharge Plan and Services                                     Social Determinants of Health (SDOH) Interventions     Readmission Risk Interventions     No data to display

## 2022-07-01 NOTE — Progress Notes (Signed)
Occupational Therapy Treatment Patient Details Name: Linda Bradford MRN: [id number] DOB: 02/05/1956 Today's Date: 07/01/2022   History of present illness Linda Bradford is a 62yoF comes [facility]/23 c progressive back pain. Patient was found to be hypertensive on arrival 207/185mHg. PMH: HTN, COPD, hypoTSH, chronic LBP, s/p R TKA on 01/10/22. CT in the ED showed mild spinal stenosis but there was concerning more severe stenosis at lateral recesses and foramina of L3 L5.   OT comments  Chart reviewed, pt greeted in bed agreeable to OT tx session. Co tx completed with PT on this date. Tx session targeted improving functional activity tolerance in the setting of ADL tasks. Improvements noted in cognition- pt is oriented to self, grossly to place, situation, oriented to date. Increased time required for processing with anticipatory fear of mobility. Progress is made throughout specifically in bed mobility, STS, amb in room, performance of grooming tasks (see flow sheet below for further details) however pt appears to continue to perform below PLOF, would continue to benefit from skilled OT to address functional deficits. OT will continue to follow acutely.     Recommendations for follow up therapy are one component of a multi-disciplinary discharge planning process, led by the attending physician.  Recommendations may be updated based on patient status, additional functional criteria and insurance authorization.    Follow Up Recommendations  Skilled nursing-short term rehab (<3 hours/day)     Assistance Recommended at Discharge Frequent or constant Supervision/Assistance  Patient can return home with the following  Direct supervision/assist for medications management;Direct supervision/assist for financial management;Assistance with feeding;Assistance with cooking/housework;Assist for transportation;Help with stairs or ramp for entrance;A lot of help with walking and/or transfers;A lot of help with  bathing/dressing/bathroom   Equipment Recommendations  Other (comment) (defer)    Recommendations for Other Services      Precautions / Restrictions Precautions Precautions: Fall Restrictions Weight Bearing Restrictions: No       Mobility Bed Mobility Overal bed mobility: Needs Assistance Bed Mobility: Supine to Sit     Supine to sit: Min assist          Transfers Overall transfer Bradford: Needs assistance Equipment used: Rolling walker (2 wheels) Transfers: Sit to/from Stand, Bed to chair/wheelchair/BSC Sit to Stand: Mod assist, Max assist, +2 physical assistance Stand pivot transfers: Min guard               Balance Overall balance assessment: Needs assistance Sitting-balance support: Feet supported Sitting balance-Leahy Scale: Good     Standing balance support: Bilateral upper extremity supported, Reliant on assistive device for balance Standing balance-Leahy Scale: Fair                             ADL either performed or assessed with clinical judgement   ADL Overall ADL's : Needs assistance/impaired Eating/Feeding: Set up;Sitting   Grooming: Oral care;Set up;Sitting Grooming Details (indicate cue type and reason): essential tremor noted, pt reports it feels close to baseline             Lower Body Dressing: Maximal assistance   Toilet Transfer: Minimal assistance;Moderate assistance;Rolling walker (2 wheels);+2 for safety/equipment;+2 for physical assistance Toilet Transfer Details (indicate cue type and reason): simulated to bedside chair         Functional mobility during ADLs: Minimal assistance;+2 for safety/equipment;Rolling walker (2 wheels);Cueing for safety (8' with RW, then 4' with RW with close chair follow)      Extremity/Trunk Assessment  Vision       Perception     Praxis      Cognition Arousal/Alertness: Awake/alert Behavior During Therapy: WFL for tasks assessed/performed Overall  Cognitive Status: Impaired/Different from baseline Area of Impairment: Attention, Following commands, Safety/judgement, Awareness, Problem solving, Memory                   Current Attention Bradford: Sustained Memory: Decreased short-term memory Following Commands: Follows one step commands with increased time Safety/Judgement: Decreased awareness of safety, Decreased awareness of deficits Awareness: Emergent Problem Solving: Requires verbal cues, Requires tactile cues, Slow processing          Exercises      Shoulder Instructions       General Comments      Pertinent Vitals/ Pain       Pain Assessment Pain Assessment: 0-10 Pain Score: 6  Faces Pain Scale: Hurts little more Pain Location: R hip, lower back Pain Descriptors / Indicators: Sore, Aching, Discomfort Pain Intervention(s): Limited activity within patient's tolerance, Monitored during session, Repositioned  Home Living                                          Prior Functioning/Environment              Frequency  Min 2X/week        Progress Toward Goals  OT Goals(current goals can now be found in the care plan section)  Progress towards OT goals: Progressing toward goals     Plan Discharge plan remains appropriate;Frequency remains appropriate    Co-evaluation    PT/OT/SLP Co-Evaluation/Treatment: Yes Reason for Co-Treatment: Necessary to address cognition/behavior during functional activity PT goals addressed during session: Mobility/safety with mobility;Balance;Proper use of DME;Strengthening/ROM OT goals addressed during session: ADL's and self-care;Proper use of Adaptive equipment and DME;Strengthening/ROM      AM-PAC OT "6 Clicks" Daily Activity     Outcome Measure   Help from another person eating meals?: A Little Help from another person taking care of personal grooming?: A Little Help from another person toileting, which includes using toliet, bedpan, or  urinal?: A Lot Help from another person bathing (including washing, rinsing, drying)?: A Lot Help from another person to put on and taking off regular upper body clothing?: A Little Help from another person to put on and taking off regular lower body clothing?: A Lot 6 Click Score: 15    End of Session Equipment Utilized During Treatment: Gait belt;Rolling walker (2 wheels)  OT Visit Diagnosis: Unsteadiness on feet (R26.81);Muscle weakness (generalized) (M62.81);Other symptoms and signs involving cognitive function   Activity Tolerance Patient tolerated treatment well;Patient limited by fatigue   Patient Left in chair;with call bell/phone within reach;with chair alarm set   Nurse Communication Mobility status        Time: 3419-6222 OT Time Calculation (min): 26 min  Charges: OT General Charges $OT Visit: 1 Visit OT Treatments $Self Care/Home Management : 8-22 mins  Shanon Payor, OTD OTR/L  07/01/22, 12:46 PM

## 2022-07-01 NOTE — TOC Progression Note (Signed)
Transition of Care [facility]) - Progression Note    Patient Details  Name: Linda Bradford MRN: [id number] Date of Birth: 1955/12/25  Transition of Care [facility]) CM/SW [place], [place] Phone Number: 07/01/2022, 10:31 AM  Clinical Narrative:     Insurance auth achieved for [place] ID: [id number] Audree Camel [id number] 12/1-12/5       Expected Discharge Plan and Services                                                 Social Determinants of Health (SDOH) Interventions    Readmission Risk Interventions     No data to display

## 2022-07-01 NOTE — Care Management Important Message (Signed)
Important Message  Patient Details  Name: Linda Bradford MRN: [id number] Date of Birth: Jun 09, 1956   Medicare Important Message Given:  Yes     Dannette Barbara 07/01/2022, 12:37 PM

## 2022-07-02 ENCOUNTER — Emergency Department
Admission: EM | Admit: 2022-07-02 | Discharge: 2022-07-02 | Disposition: A | Discharge status: Home / Self Care | Payer: Medicare PPO | Attending: Emergency Medicine | Admitting: Emergency Medicine

## 2022-07-02 ENCOUNTER — Other Ambulatory Visit: Payer: Self-pay

## 2022-07-02 ENCOUNTER — Emergency Department: Payer: Medicare PPO

## 2022-07-02 DIAGNOSIS — M5442 Lumbago with sciatica, left side: Secondary | ICD-10-CM | POA: Insufficient documentation

## 2022-07-02 DIAGNOSIS — M5441 Lumbago with sciatica, right side: Secondary | ICD-10-CM

## 2022-07-02 DIAGNOSIS — M48061 Spinal stenosis, lumbar region without neurogenic claudication: Secondary | ICD-10-CM | POA: Insufficient documentation

## 2022-07-02 DIAGNOSIS — R32 Unspecified urinary incontinence: Secondary | ICD-10-CM | POA: Diagnosis not present

## 2022-07-02 DIAGNOSIS — M549 Dorsalgia, unspecified: Secondary | ICD-10-CM | POA: Diagnosis present

## 2022-07-02 LAB — URINE DRUG SCREEN, QUALITATIVE (ARMC ONLY)
Amphetamines, Ur Screen: NOT DETECTED
Barbiturates, Ur Screen: NOT DETECTED
Benzodiazepine, Ur Scrn: NOT DETECTED
Cannabinoid 50 Ng, Ur ~~LOC~~: NOT DETECTED
Cocaine Metabolite,Ur ~~LOC~~: NOT DETECTED
MDMA (Ecstasy)Ur Screen: NOT DETECTED
Methadone Scn, Ur: NOT DETECTED
Opiate, Ur Screen: NOT DETECTED
Phencyclidine (PCP) Ur S: NOT DETECTED
Tricyclic, Ur Screen: NOT DETECTED

## 2022-07-02 LAB — CBC
HCT: 40.6 % (ref 36.0–46.0)
Hemoglobin: 14.3 g/dL (ref 12.0–15.0)
MCH: 31.2 pg (ref 26.0–34.0)
MCHC: 35.2 g/dL (ref 30.0–36.0)
MCV: 88.5 fL (ref 80.0–100.0)
Platelets: 301 10*3/uL (ref 150–400)
RBC: 4.59 MIL/uL (ref 3.87–5.11)
RDW: 13.7 % (ref 11.5–15.5)
WBC: 13.2 10*3/uL — ABNORMAL HIGH (ref 4.0–10.5)
nRBC: 0 % (ref 0.0–0.2)

## 2022-07-02 LAB — COMPREHENSIVE METABOLIC PANEL
ALT: 32 U/L (ref 0–44)
AST: 31 U/L (ref 15–41)
Albumin: 3.5 g/dL (ref 3.5–5.0)
Alkaline Phosphatase: 115 U/L (ref 38–126)
Anion gap: 10 (ref 5–15)
BUN: 19 mg/dL (ref 8–23)
CO2: 21 mmol/L — ABNORMAL LOW (ref 22–32)
Calcium: 9.3 mg/dL (ref 8.9–10.3)
Chloride: 99 mmol/L (ref 98–111)
Creatinine, Ser: 0.5 mg/dL (ref 0.44–1.00)
GFR, Estimated: >60 mL/min (ref >60)
Glucose, Bld: 180 mg/dL — ABNORMAL HIGH (ref 70–99)
Potassium: 4.3 mmol/L (ref 3.5–5.1)
Sodium: 130 mmol/L — ABNORMAL LOW (ref 135–145)
Total Bilirubin: 0.8 mg/dL (ref 0.3–1.2)
Total Protein: 8.1 g/dL (ref 6.5–8.1)

## 2022-07-02 LAB — ACETAMINOPHEN LEVEL: Acetaminophen (Tylenol), Serum: <10 ug/mL — ABNORMAL LOW (ref 10–30)

## 2022-07-02 LAB — HEMOGLOBIN A1C
Hgb A1c MFr Bld: 8.2 % — ABNORMAL HIGH (ref 4.8–5.6)
Mean Plasma Glucose: 189 mg/dL

## 2022-07-02 LAB — ETHANOL: Alcohol, Ethyl (B): <10 mg/dL (ref <10)

## 2022-07-02 LAB — SALICYLATE LEVEL: Salicylate Lvl: <7 mg/dL — ABNORMAL LOW (ref 7.0–30.0)

## 2022-07-02 MED ORDER — CYCLOBENZAPRINE HCL 10 MG PO TABS
5.0000 mg | ORAL_TABLET | Freq: Once | ORAL | Status: AC
  Administered 2022-07-02: 5 mg via ORAL
  Filled 2022-07-02: qty 1

## 2022-07-02 MED ORDER — PREDNISONE 20 MG PO TABS
60.0000 mg | ORAL_TABLET | Freq: Once | ORAL | Status: AC
  Administered 2022-07-02: 60 mg via ORAL
  Filled 2022-07-02: qty 3

## 2022-07-02 MED ORDER — PREDNISONE 10 MG PO TABS: 20.0000 mg | ORAL_TABLET | Freq: Every day | ORAL | 0 refills | Status: AC

## 2022-07-02 MED ORDER — KETOROLAC TROMETHAMINE 15 MG/ML IJ SOLN
15.0000 mg | Freq: Once | INTRAMUSCULAR | Status: AC
  Administered 2022-07-02: 15 mg via INTRAMUSCULAR
  Filled 2022-07-02: qty 1

## 2022-07-02 NOTE — ED Triage Notes (Signed)
Pt states she has been having back pain the last week- pt was recently admitted and released to a rehab and she AMA'ed- pt also tearful in triage saying she "doesn't want to live" and has been feeling this way for the last couple days- pt states she cannot walk at all and that the rehab was not helping her get up

## 2022-07-02 NOTE — ED Triage Notes (Signed)
Pt in via EMS from home with c/o  Pt was admitted on 11/23, d/c yesterday and taken to [facility] and left the facility AMA today. Pt was supposed to ger PT but did not want to wait until Monday. Pt reports continued back pain.

## 2022-07-02 NOTE — ED Provider Notes (Signed)
[facility] Provider Note    Event Date/Time   First MD Initiated Contact with Patient 07/02/22 1552     (approximate)   History   Back Pain and Suicidal   HPI  Linda Bradford is a 66 y.o. female with past medical history significant for chronic back pain, who presents to the emergency department with back pain and suicidal thoughts.  Patient was recently at rehab for her chronic back pain and made statements that she did not want to live anymore.  Signed out AMA.  Patient initially stated that she was having suicidal ideation however after further conversation she states that she is just having back pain and wants her back pain taking care of it which is why she is here.  Denies any thoughts of suicide.  No prior suicide attempts in the past.  Complaining of lower extremity weakness.  Ongoing numbness.  Denies any new falls or trauma.  Endorses urinary incontinence.     Physical Exam   Triage Vital Signs: ED Triage Vitals  Enc Vitals Group     BP 07/02/22 1437 129/69     Pulse Rate 07/02/22 1437 (!) 116     Resp 07/02/22 1437 20     Temp 07/02/22 1437 98.2 F (36.8 C)     Temp Source 07/02/22 1437 Oral     SpO2 07/02/22 1437 95 %     Weight --      Height --      Head Circumference --      Peak Flow --      Pain Score 07/02/22 1438 9     Pain Loc --      Pain Edu? --      Excl. in [place]? --     Most recent vital signs: Vitals:   07/02/22 2009 07/02/22 2010  BP: (!) 130/50 (!) 130/50  Pulse: 98 95  Resp: 18   Temp: 98.9 F (37.2 C)   SpO2: 98% 94%    Physical Exam Constitutional:      Appearance: She is well-developed.  HENT:     Head: Atraumatic.  Eyes:     Conjunctiva/sclera: Conjunctivae normal.  Cardiovascular:     Rate and Rhythm: Regular rhythm.  Pulmonary:     Effort: No respiratory distress.  Abdominal:     General: There is no distension.  Musculoskeletal:        General: Normal range of motion.     Cervical back: Normal  range of motion.  Skin:    General: Skin is warm.  Neurological:     Mental Status: She is alert. Mental status is at baseline.     Comments: Cranial nerves grossly intact.  5/5 strength bilateral upper and lower extremities.  Sensation intact in upper and lower extremities.  Normal gait.          IMPRESSION / MDM / ASSESSMENT AND PLAN / ED COURSE  I reviewed the triage vital signs and the nursing notes.  Differential diagnosis including cauda equina/epidural compression syndrome given the patient's back pain and lower extremity weakness, musculoskeletal, lumbar strain, herniated disc, radiculopathy.  Given patient's new weakness will order an MRI to further evaluate for cauda equina or epidural compression syndrome.   RADIOLOGY I independently reviewed imaging, my interpretation of imaging: MRI reviewed with no signs of cauda equina.  Demonstrated stenosis to the thoracic and lumbar spine.      ED Results / Procedures / Treatments   Labs (all labs  ordered are listed, but only abnormal results are displayed) Labs interpreted as -    Labs Reviewed  COMPREHENSIVE METABOLIC PANEL - Abnormal; Notable for the following components:      Result Value   Sodium 130 (*)    CO2 21 (*)    Glucose, Bld 180 (*)    All other components within normal limits  SALICYLATE LEVEL - Abnormal; Notable for the following components:   Salicylate Lvl <6.8 (*)    All other components within normal limits  ACETAMINOPHEN LEVEL - Abnormal; Notable for the following components:   Acetaminophen (Tylenol), Serum <10 (*)    All other components within normal limits  CBC - Abnormal; Notable for the following components:   WBC 13.2 (*)    All other components within normal limits  ETHANOL  URINE DRUG SCREEN, QUALITATIVE ([facility] ONLY)   Given ketorolac and Flexeril.  On reevaluation patient contracting for safety.  Denies any suicidal ideation.  Do not feel that the patient needs IVC need for  evaluation by psychiatry.  States that if she went home she has no thoughts of suicide and would not hurt herself.  Will follow-up with primary care physician.  Will do a short course of steroids.  Will follow-up with a spine surgeon.    PROCEDURES:  Critical Care performed: No  Procedures  Patient's presentation is most consistent with acute presentation with potential threat to life or bodily function.   MEDICATIONS ORDERED IN ED: Medications  predniSONE (DELTASONE) tablet 60 mg (has no administration in time range)  ketorolac (TORADOL) 15 MG/ML injection 15 mg (15 mg Intramuscular Given 07/02/22 1801)  cyclobenzaprine (FLEXERIL) tablet 5 mg (5 mg Oral Given 07/02/22 1759)    FINAL CLINICAL IMPRESSION(S) / ED DIAGNOSES   Final diagnoses:  Acute bilateral low back pain with bilateral sciatica  Spinal stenosis of lumbar region, unspecified whether neurogenic claudication present     Rx / DC Orders   ED Discharge Orders          Ordered    predniSONE (DELTASONE) 10 MG tablet  Daily with breakfast        07/02/22 2058             Note:  This document was prepared using Dragon voice recognition software and may include unintentional dictation errors.   Nathaniel Man, MD 07/02/22 2101

## 2022-07-02 NOTE — Discharge Instructions (Addendum)
You were seen in the emergency department for lower back pain.  You had an MRI done that showed spinal stenosis.  It is importantly follow-up with your primary care physician for ongoing back pain.  You may need to be seen by a spine surgeon or by pain clinic.  You are given a short course of steroids.  This may increase your glucose.  You can use Lidoderm patches  Pain control:  Ibuprofen (motrin/aleve/advil) - You can take 3-4 tablets (600-800 mg) every 6 hours as needed for pain/fever.  Acetaminophen (tylenol) - You can take 2 extra strength tablets (1000 mg) every 6 hours as needed for pain/fever.  You can alternate these medications or take them together.  Make sure you eat food/drink water when taking these medications.

## 2022-07-02 NOTE — ED Provider Triage Note (Signed)
Emergency Medicine Provider Triage Evaluation Note  Linda Bradford , a 66 y.o. female  was evaluated in triage.  Pt complains of back pain for the past week. She had been sent to rehab after hospitalized for blood pressure and glucose control. She signed out of rehab AMA because she didn't feel like she was getting any better and PT wasn't coming until Monday.Marland Kitchen  Physical Exam  There were no vitals taken for this visit. Gen:   Awake, no distress   Resp:  Normal effort  MSK:   Moves extremities without difficulty  Other:    Medical Decision Making  Medically screening exam initiated at 2:34 PM.  Appropriate orders placed.  Linda Bradford was informed that the remainder of the evaluation will be completed by another provider, this initial triage assessment does not replace that evaluation, and the importance of remaining in the ED until their evaluation is complete.    Victorino Dike, FNP 07/02/22 1436

## 2022-07-04 NOTE — Progress Notes (Signed)
A1c is no longer well controlled; recommend setting an appt up to discuss next steps. Goal of <7%. Currently 8.2% up from 6.6%; recommend 3 month interval for rechecks given increase.

## 2022-07-05 ENCOUNTER — Telehealth: Payer: Self-pay | Admitting: Family Medicine

## 2022-07-05 NOTE — Telephone Encounter (Signed)
Requested medication (s) are due for refill today - no  Requested medication (s) are on the active medication list -no  Future visit scheduled -no  Last refill: unsure  Notes to clinic: non delegated Rx- no longer on current medication list  Requested Prescriptions  Pending Prescriptions Disp Refills   metaxalone (SKELAXIN) 800 MG tablet [Pharmacy Med Name: METAXALONE 800 MG TAB] 270 tablet     Sig: TAKE 1 TABLET BY MOUTH 3 TIMES DAILY [facility]     Not Delegated - Analgesics:  Muscle Relaxants Failed - 07/05/2022 10:20 AM      Failed - This refill cannot be delegated      Failed - Valid encounter within last 6 months    Recent Outpatient Visits           7 months ago Encounter for health maintenance examination   [facility] Thedore Mins, [place], PA-C   9 months ago Thrush of mouth and esophagus [facility])   [facility] Tally Joe T, FNP   9 months ago Dyspnea on exertion   [facility] Birdie Sons, MD   10 months ago Chronic obstructive pulmonary disease with acute exacerbation [facility])   [facility] Tally Joe T, FNP   1 year ago Diabetes mellitus without complication [facility])   [place], Vickki Muff, PA-C                 Requested Prescriptions  Pending Prescriptions Disp Refills   metaxalone (SKELAXIN) 800 MG tablet [Pharmacy Med Name: METAXALONE 800 MG TAB] 270 tablet     Sig: TAKE 1 TABLET BY MOUTH 3 TIMES DAILY FORMUSCLE SPASMS     Not Delegated - Analgesics:  Muscle Relaxants Failed - 07/05/2022 10:20 AM      Failed - This refill cannot be delegated      Failed - Valid encounter within last 6 months    Recent Outpatient Visits           7 months ago Encounter for health maintenance examination   [facility] Thedore Mins, [place], PA-C   9 months ago [place] of mouth and esophagus [facility])   [facility] Tally Joe T, FNP   9 months ago Dyspnea on  exertion   [facility] Birdie Sons, MD   10 months ago Chronic obstructive pulmonary disease with acute exacerbation [facility])   [facility] Tally Joe T, FNP   1 year ago Diabetes mellitus without complication [facility])   [facility] [place], Vickki Muff, [place]

## 2022-07-07 ENCOUNTER — Other Ambulatory Visit: Payer: Self-pay | Admitting: Family Medicine

## 2022-07-08 LAB — BLOOD GAS, ARTERIAL
Acid-Base Excess: 1.7 mmol/L (ref 0.0–2.0)
Bicarbonate: 24.4 mmol/L (ref 20.0–28.0)
O2 Saturation: 94 %
Patient temperature: 37
pCO2 arterial: 32 mmHg (ref 32–48)
pH, Arterial: 7.49 — ABNORMAL HIGH (ref 7.35–7.45)
pO2, Arterial: 63 mmHg — ABNORMAL LOW (ref 83–108)

## 2022-07-12 ENCOUNTER — Ambulatory Visit: Payer: Medicare PPO | Admitting: Family Medicine

## 2022-07-12 ENCOUNTER — Encounter: Payer: Self-pay | Admitting: Family Medicine

## 2022-07-12 VITALS — BP 115/74 | HR 74 | Temp 98.1°F | Resp 16 | Ht 68.0 in | Wt 207.0 lb

## 2022-07-12 DIAGNOSIS — Z23 Encounter for immunization: Secondary | ICD-10-CM | POA: Insufficient documentation

## 2022-07-12 DIAGNOSIS — F331 Major depressive disorder, recurrent, moderate: Secondary | ICD-10-CM | POA: Diagnosis not present

## 2022-07-12 DIAGNOSIS — E1149 Type 2 diabetes mellitus with other diabetic neurological complication: Secondary | ICD-10-CM | POA: Diagnosis not present

## 2022-07-12 DIAGNOSIS — I152 Hypertension secondary to endocrine disorders: Secondary | ICD-10-CM

## 2022-07-12 DIAGNOSIS — F411 Generalized anxiety disorder: Secondary | ICD-10-CM

## 2022-07-12 DIAGNOSIS — E1159 Type 2 diabetes mellitus with other circulatory complications: Secondary | ICD-10-CM

## 2022-07-12 MED ORDER — BUPROPION HCL ER (XL) 300 MG PO TB24: 300.0000 mg | ORAL_TABLET | Freq: Every day | ORAL | 3 refills | Status: DC

## 2022-07-12 MED ORDER — BLOOD GLUCOSE METER KIT: PACK | 0 refills | Status: DC

## 2022-07-12 MED ORDER — GABAPENTIN 100 MG PO CAPS: 300.0000 mg | ORAL_CAPSULE | Freq: Every day | ORAL | Status: DC

## 2022-07-12 NOTE — Assessment & Plan Note (Signed)
Chronic, worsening Increase Wellbutrin to '300mg'$  to assist Contracted for safety; no longer reporting SI; denies HI

## 2022-07-12 NOTE — Progress Notes (Signed)
Established patient visit   Patient: Linda Bradford   DOB: 02/28/1956   66 y.o. Female  MRN: [id number] Visit Date: 07/12/2022  Today's healthcare provider: [name], FNP  Re Introduced to nurse practitioner role and practice setting.  All questions answered.  Discussed provider/patient relationship and expectations.   I,Tiffany J Bragg,acting as a scribe for [name], FNP.,have documented all relevant documentation on the behalf of [name], FNP,as directed by  [name], FNP while in the presence of [name], FNP.   Chief Complaint  Patient presents with   Diabetes   Follow-up   Subjective    HPI  Follow up Hospitalization  Patient was admitted to [facility] on 06/23/22 and discharged on 07/01/22. She was treated for hypertension. States she was transferred to a rehab center but left AMA. She reports excellent compliance with treatment. She reports this condition is improved.  ----------------------------------------------------------------------------------------- -  Diabetes Mellitus Type II, Follow-up  Lab Results  Component Value Date   HGBA1C 8.2 (H) 07/01/2022   HGBA1C 6.6 (H) 12/07/2021   HGBA1C 6.8 (H) 09/02/2021   FBG 140-200s; lowest at 115 FBG  Wt Readings from Last 3 Encounters:  07/12/22 207 lb (93.9 kg)  07/01/22 213 lb 3 oz (96.7 kg)  06/23/22 220 lb (99.8 kg)   Last seen for diabetes 7 months ago.  Management since then includes no changes. She reports excellent compliance with treatment. She is having side effects. Diarrhea  Symptoms: Yes fatigue No foot ulcerations  Yes appetite changes No nausea  Yes paresthesia of the feet  No polydipsia  No polyuria No visual disturbances   No vomiting     Home blood sugar records:  around 145  Episodes of hypoglycemia? No  Most Recent Eye Exam: done last year Current exercise: none Current diet habits: low fat/ cholesterol  Pertinent Labs: Lab Results  Component Value Date    CHOL 169 12/07/2021   HDL 60 12/07/2021   LDLCALC 83 12/07/2021   TRIG 149 12/07/2021   CHOLHDL 2.8 12/07/2021   Lab Results  Component Value Date   NA 130 (L) 07/02/2022   K 4.3 07/02/2022   CREATININE 0.50 07/02/2022   GFRNONAA >60 07/02/2022   MICROALBUR Negative 12/02/2019   LABMICR 35.5 04/06/2021     ---------------------------------------------------------------------------------------------------   Medications: Outpatient Medications Prior to Visit  Medication Sig   acetaminophen (TYLENOL) 500 MG tablet Take 1,000 mg by mouth every 6 (six) hours as needed for mild pain or moderate pain.    albuterol (VENTOLIN HFA) 108 (90 Base) MCG/ACT inhaler INHALE 2 PUFFS INTO THE LUNGS EVERY 4 HOURS AS NEEDED FOR WHEEZING OR SHORTNESS OF BREATH   amLODipine (NORVASC) 10 MG tablet Take 1 tablet (10 mg total) by mouth daily.   aspirin 81 MG chewable tablet Chew 1 tablet (81 mg total) by mouth 2 (two) times daily.   benzonatate (TESSALON) 200 MG capsule TAKE 1 CAPSULE BY MOUTH 3 TIMES DAILY ASNEEDED   Cholecalciferol (VITAMIN D) 50 MCG (2000 UT) tablet Take 2,000 Units by mouth daily.   diclofenac (VOLTAREN) 50 MG EC tablet    diclofenac Sodium (VOLTAREN) 1 % GEL    DULoxetine (CYMBALTA) 60 MG capsule TAKE 1 CAPSULE BY MOUTH TWICE DAILY   fluticasone (FLONASE) 50 MCG/ACT nasal spray USE 2 SPRAYS IN EACH NOSTRIL ONCE DAILY   Fluticasone-Umeclidin-Vilant (TRELEGY ELLIPTA) 200-62.5-25 MCG/ACT AEPB Inhale 1 puff into the lungs 2 (two) times daily.  glucose blood (CONTOUR NEXT TEST) test strip To check blood sugar once daily   hydrALAZINE (APRESOLINE) 50 MG tablet Take 1 tablet (50 mg total) by mouth every 8 (eight) hours.   insulin aspart (NOVOLOG) 100 UNIT/ML injection Inject 6 Units into the skin 3 (three) times daily with meals.   insulin glargine (LANTUS) 100 UNIT/ML injection Inject 0.12 mLs (12 Units total) into the skin at bedtime.   ipratropium-albuterol (DUONEB) 0.5-2.5 (3)  MG/3ML SOLN Take 3 mLs by nebulization every 6 (six) hours as needed.   levothyroxine (SYNTHROID) 75 MCG tablet Take 1 tablet (75 mcg total) by mouth daily before breakfast.   loperamide (IMODIUM A-D) 2 MG tablet Take 2-4 mg by mouth 4 (four) times daily as needed for diarrhea or loose stools.   meloxicam (MOBIC) 15 MG tablet Take 1 tablet (15 mg total) by mouth daily.   metFORMIN (GLUCOPHAGE-XR) 500 MG 24 hr tablet TAKE 2 TABLETS BY MOUTH ONCE A DAY WITH SUPPER. Please schedule office visit before any future refill.   metoprolol succinate (TOPROL-XL) 50 MG 24 hr tablet TAKE 1 TABLET BY MOUTH ONCE DAILY WITH OR IMMEDIATELY FOLLOWING A MEAL   nicotine (NICODERM CQ - DOSED IN MG/24 HOURS) 21 mg/24hr patch Place 1 patch (21 mg total) onto the skin daily.   nicotine polacrilex (NICOTINE MINI) 2 MG lozenge Take 1 lozenge (2 mg total) by mouth every 2 (two) hours as needed for smoking cessation.   nystatin (MYCOSTATIN) 100000 UNIT/ML suspension Take 5 mLs (500,000 Units total) by mouth 4 (four) times daily.   nystatin (MYCOSTATIN/NYSTOP) powder Apply 1 application topically 3 (three) times daily.   oxybutynin (DITROPAN-XL) 5 MG 24 hr tablet Take 1 tablet (5 mg total) by mouth at bedtime.   primidone (MYSOLINE) 50 MG tablet Take 0.5 tablets (25 mg total) by mouth 2 (two) times daily.   rosuvastatin (CRESTOR) 10 MG tablet Take 1 tablet (10 mg total) by mouth daily.   sodium chloride 1 g tablet Take 1 tablet (1 g total) by mouth 2 (two) times daily with a meal.   [DISCONTINUED] buPROPion (WELLBUTRIN XL) 150 MG 24 hr tablet TAKE 1 TABLET BY MOUTH ONCE DAILY. *MAKEAPPOINTMENT FOR FURTHER FILLS*   [DISCONTINUED] gabapentin (NEURONTIN) 100 MG capsule Take 2 capsules (200 mg total) by mouth 3 (three) times daily.   [DISCONTINUED] traZODone (DESYREL) 100 MG tablet Take 2 tablets (200 mg total) by mouth at bedtime as needed for sleep. (Patient taking differently: Take 100 mg by mouth at bedtime as needed for sleep.)    [DISCONTINUED] oxyCODONE (OXY IR/ROXICODONE) 5 MG immediate release tablet Take 1 tablet (5 mg total) by mouth every 6 (six) hours as needed for moderate pain. (Patient not taking: Reported on 07/12/2022)   [DISCONTINUED] senna-docusate (SENOKOT-S) 8.6-50 MG tablet Take 2 tablets by mouth 2 (two) times daily. (Patient not taking: Reported on 07/12/2022)   [DISCONTINUED] Spacer/Aero-Holding Chambers (AEROCHAMBER PLUS WITH MASK) inhaler  (Patient not taking: Reported on 07/12/2022)   [DISCONTINUED] theophylline (THEODUR) 300 MG 12 hr tablet Take 300 mg by mouth 2 (two) times daily. (Patient not taking: Reported on 07/12/2022)   No facility-administered medications prior to visit.    Review of Systems    Objective    BP 115/74 (BP Location: Left Arm, Patient Position: Sitting, Cuff Size: Large)   Pulse 74   Temp 98.1 F (36.7 C) (Oral)   Resp 16   Ht _0  (1.727 m)   Wt 207 lb (93.9 kg)   BMI  31.47 kg/m   Physical Exam Vitals and nursing note reviewed.  Constitutional:      General: She is not in acute distress.    Appearance: Normal appearance. She is obese. She is not ill-appearing, toxic-appearing or diaphoretic.  HENT:     Head: Normocephalic and atraumatic.  Cardiovascular:     Rate and Rhythm: Normal rate and regular rhythm.     Pulses: Normal pulses.     Heart sounds: Normal heart sounds. No murmur heard.    No friction rub. No gallop.  Pulmonary:     Effort: Pulmonary effort is normal. No respiratory distress.     Breath sounds: Decreased air movement present. No stridor. Examination of the right-upper field reveals wheezing. Examination of the left-upper field reveals wheezing. Examination of the right-middle field reveals wheezing. Examination of the left-middle field reveals wheezing. Examination of the right-lower field reveals wheezing and rhonchi. Examination of the left-lower field reveals wheezing and rhonchi. Wheezing and rhonchi present. No rales.  Chest:      Chest wall: No tenderness.  Musculoskeletal:        General: No swelling, tenderness, deformity or signs of injury. Normal range of motion.     Right lower leg: No edema.     Left lower leg: No edema.  Skin:    General: Skin is warm and dry.     Capillary Refill: Capillary refill takes less than 2 seconds.     Coloration: Skin is not jaundiced or pale.     Findings: No bruising, erythema, lesion or rash.  Neurological:     General: No focal deficit present.     Mental Status: She is alert and oriented to person, place, and time. Mental status is at baseline.     Cranial Nerves: No cranial nerve deficit.     Sensory: No sensory deficit.     Motor: No weakness.     Coordination: Coordination normal.  Psychiatric:        Mood and Affect: Mood normal.        Behavior: Behavior normal.        Thought Content: Thought content normal.        Judgment: Judgment normal.     No results found for any visits on 07/12/22.  Assessment & Plan     Problem List Items Addressed This Visit       Cardiovascular and Mediastinum   Hypertension associated with type 2 diabetes mellitus (HCC)    Chronic, stable Goal <130/<80 Notes elevation with stress, illness and steroid use Endorses medication compliance with norvasc 10 mg, toprol 50 mg      Relevant Orders   CBC   Basic Metabolic Panel (BMET)     Endocrine   Diabetes mellitus type 2 with neurological manifestations ([place]) - Primary   Relevant Medications   gabapentin (NEURONTIN) 100 MG capsule   blood glucose meter kit and supplies   Other Relevant Orders   Urine Microalbumin w/creat. ratio     Other   Anxiety, generalized    Chronic, worsening No longer reporting SI; never had a plan to assist with ending her life Reports health stressors and recent illness caused worsening complaints Continue wellbutrin xl and cymablta 60 mg BID to assist Contracted for safety       Relevant Medications   buPROPion (WELLBUTRIN XL) 300 MG 24  hr tablet   Depression, major, recurrent, moderate (HCC)    Chronic, worsening Increase Wellbutrin to 360m to assist Contracted for safety;  no longer reporting SI; denies HI      Relevant Medications   buPROPion (WELLBUTRIN XL) 300 MG 24 hr tablet   Need for COVID-19 vaccine   Relevant Orders   PFIZER Comirnaty(GRAY TOP)COVID-19 Vaccine (Completed)   Return in about 3 months (around 10/11/2022) for chonic disease management.     Vonna Kotyk, FNP, have reviewed all documentation for this visit. The documentation on 07/12/22 for the exam, diagnosis, procedures, and orders are all accurate and complete.  [name], [place] [phone] (phone) [phone] (fax)  [place]

## 2022-07-12 NOTE — Assessment & Plan Note (Signed)
Chronic, worsening No longer reporting SI; never had a plan to assist with ending her life Reports health stressors and recent illness caused worsening complaints Continue wellbutrin xl and cymablta 60 mg BID to assist Contracted for safety

## 2022-07-12 NOTE — Assessment & Plan Note (Signed)
Chronic, stable Goal <130/<80 Notes elevation with stress, illness and steroid use Endorses medication compliance with norvasc 10 mg, toprol 50 mg

## 2022-07-13 LAB — BASIC METABOLIC PANEL
BUN/Creatinine Ratio: 13 (ref 12–28)
BUN: 7 mg/dL — ABNORMAL LOW (ref 8–27)
CO2: 25 mmol/L (ref 20–29)
Calcium: 9.1 mg/dL (ref 8.7–10.3)
Chloride: 100 mmol/L (ref 96–106)
Creatinine, Ser: 0.53 mg/dL — ABNORMAL LOW (ref 0.57–1.00)
Glucose: 117 mg/dL — ABNORMAL HIGH (ref 70–99)
Potassium: 4.9 mmol/L (ref 3.5–5.2)
Sodium: 139 mmol/L (ref 134–144)
eGFR: 102 mL/min/{1.73_m2} (ref >59)

## 2022-07-13 LAB — CBC
Hematocrit: 40.5 % (ref 34.0–46.6)
Hemoglobin: 13.5 g/dL (ref 11.1–15.9)
MCH: 30.8 pg (ref 26.6–33.0)
MCHC: 33.3 g/dL (ref 31.5–35.7)
MCV: 93 fL (ref 79–97)
Platelets: 298 10*3/uL (ref 150–450)
RBC: 4.38 x10E6/uL (ref 3.77–5.28)
RDW: 14.2 % (ref 11.7–15.4)
WBC: 7.2 10*3/uL (ref 3.4–10.8)

## 2022-07-13 LAB — MICROALBUMIN / CREATININE URINE RATIO
Creatinine, Urine: 109.2 mg/dL
Microalb/Creat Ratio: 23 mg/g creat (ref 0–29)
Microalbumin, Urine: 25.3 ug/mL

## 2022-07-13 NOTE — Progress Notes (Signed)
Labs have stabilized; urine micro pending.  Take care, [name], [address] [place], [place] [address] [phone] (phone) [phone] (fax) [place]

## 2022-07-13 NOTE — Progress Notes (Signed)
Micro albumin has improved; repeat in 1 year. [name], [address] [place], [place] [address] [phone] (phone) [phone] (fax) [place]

## 2022-07-19 ENCOUNTER — Ambulatory Visit: Payer: Self-pay

## 2022-07-19 NOTE — Telephone Encounter (Signed)
Message from Shan Levans sent at 07/19/2022 10:16 AM EST  Summary: Nausea   Patient states that she had increased nausea especially after eating. Patient states that she has been taking Pepto Bismol, but that constipates her after taking so much. Patient is requesting an anti-nausea medication.          Chief Complaint: nausea Symptoms: pt stated nausea intermittently after eating. Nausea lasts for 2-3 hours and then subsides. Pt unsure of why and denies any vomiting Frequency: few weeks Pertinent Negatives: Patient denies abd pain, vomiting or signs of dehydration Disposition: '[]'$ ED /'[]'$ Urgent Care (no appt availability in office) / '[x]'$ Appointment(In office/virtual)/ '[]'$  [facility]/ '[]'$ Home Care/ '[]'$ Refused Recommended Disposition /'[]'$ [facility]/ '[]'$  Follow-up with PCP Additional Notes: virtual appt made for request of nausea med (Zofran)  Reason for Disposition  Nausea lasts > 1 week  Answer Assessment - Initial Assessment Questions 1. NAUSEA SEVERITY: "How bad is the nausea?" (e.g., mild, moderate, severe; dehydration, weight loss)   - MILD: loss of appetite without change in eating habits   - MODERATE: decreased oral intake without significant weight loss, dehydration, or malnutrition   - SEVERE: inadequate caloric or fluid intake, significant weight loss, symptoms of dehydration     mild 2. ONSET: "When did the nausea begin?"     After out of hospital  few weeks  3. VOMITING: "Any vomiting?" If Yes, ask: "How many times today?"     no 4. RECURRENT SYMPTOM: "Have you had nausea before?" If Yes, ask: "When was the last time?" "What happened that time?"     10 minutes after eating starts nausea head spinning 2-3 hours 5. CAUSE: "What do you think is causing the nausea?"     unknown 6. PREGNANCY: "Is there any chance you are pregnant?" (e.g., unprotected intercourse, missed birth control pill, broken condom)     N/a  Protocols used: Nausea-A-AH

## 2022-07-20 ENCOUNTER — Telehealth (INDEPENDENT_AMBULATORY_CARE_PROVIDER_SITE_OTHER): Payer: Medicare PPO | Admitting: Family Medicine

## 2022-07-20 ENCOUNTER — Other Ambulatory Visit: Payer: Self-pay | Admitting: Family Medicine

## 2022-07-20 ENCOUNTER — Encounter: Payer: Self-pay | Admitting: Family Medicine

## 2022-07-20 VITALS — Wt 207.0 lb

## 2022-07-20 DIAGNOSIS — K5904 Chronic idiopathic constipation: Secondary | ICD-10-CM | POA: Insufficient documentation

## 2022-07-20 DIAGNOSIS — R110 Nausea: Secondary | ICD-10-CM | POA: Insufficient documentation

## 2022-07-20 DIAGNOSIS — F458 Other somatoform disorders: Secondary | ICD-10-CM | POA: Insufficient documentation

## 2022-07-20 MED ORDER — METOCLOPRAMIDE HCL 10 MG PO TABS: 10.0000 mg | ORAL_TABLET | Freq: Three times a day (TID) | ORAL | 0 refills | Status: DC | PRN

## 2022-07-20 MED ORDER — PANTOPRAZOLE SODIUM 20 MG PO TBEC: 20.0000 mg | DELAYED_RELEASE_TABLET | Freq: Two times a day (BID) | ORAL | 0 refills | Status: DC

## 2022-07-20 NOTE — Assessment & Plan Note (Signed)
Recommend small, bland meals with good PO intake Continue to move your body to assist Add fiber Trial of reglan to assist with both motility and nausea

## 2022-07-20 NOTE — Assessment & Plan Note (Signed)
Trial of PPI to assist; pt reports health concerns worsening with her spouse and she has her own health issues and it's been a trying few weeks Denies further assistance in relation to mood; consented for safety- will reach back out if needed

## 2022-07-20 NOTE — Progress Notes (Signed)
I,Connie R Striblin,acting as a Education administrator for [name], FNP.,have documented all relevant documentation on the behalf of [name], FNP,as directed by  [name], FNP while in the presence of [name], FNP.  MyChart Video Visit    Virtual Visit via Video Note   This format is felt to be most appropriate for this patient at this time. Physical exam was limited by quality of the video and audio technology used for the visit.   Patient location: Home Provider location: [facility]   I discussed the limitations of evaluation and management by telemedicine and the availability of in person appointments. The patient expressed understanding and agreed to proceed.  Patient: Linda Bradford   DOB: 1955/12/11   66 y.o. Female  MRN: [id number] Visit Date: 07/20/2022  Today's healthcare provider: [name], FNP  Re Introduced to nurse practitioner role and practice setting.  All questions answered.  Discussed provider/patient relationship and expectations.   Chief Complaint  Patient presents with   Nausea        Subjective    HPI HPI     Nausea    Additional comments:        Last edited by Araceli Bouche, CMA on 07/20/2022  2:03 PM.      Nausea: Pt complains of nausea. Symptoms began a month ago. Associated symptoms include dizziness . Pt denies symptoms of no fever or chills. Pt has been taking Pepto Bismol with slight  relief.   Medications: Outpatient Medications Prior to Visit  Medication Sig   acetaminophen (TYLENOL) 500 MG tablet Take 1,000 mg by mouth every 6 (six) hours as needed for mild pain or moderate pain.    albuterol (VENTOLIN HFA) 108 (90 Base) MCG/ACT inhaler INHALE 2 PUFFS INTO THE LUNGS EVERY 4 HOURS AS NEEDED FOR WHEEZING OR SHORTNESS OF BREATH   amLODipine (NORVASC) 10 MG tablet Take 1 tablet (10 mg total) by mouth daily.   aspirin 81 MG chewable tablet Chew 1 tablet (81 mg total) by mouth 2 (two) times daily.    benzonatate (TESSALON) 200 MG capsule TAKE 1 CAPSULE BY MOUTH 3 TIMES DAILY ASNEEDED   blood glucose meter kit and supplies Dispense based on patient and insurance preference. Once daily. (FOR ICD-10 E10.9, E11.9).   buPROPion (WELLBUTRIN XL) 300 MG 24 hr tablet Take 1 tablet (300 mg total) by mouth daily.   Cholecalciferol (VITAMIN D) 50 MCG (2000 UT) tablet Take 2,000 Units by mouth daily.   diclofenac (VOLTAREN) 50 MG EC tablet    diclofenac Sodium (VOLTAREN) 1 % GEL    DULoxetine (CYMBALTA) 60 MG capsule TAKE 1 CAPSULE BY MOUTH TWICE DAILY   fluticasone (FLONASE) 50 MCG/ACT nasal spray USE 2 SPRAYS IN EACH NOSTRIL ONCE DAILY   Fluticasone-Umeclidin-Vilant (TRELEGY ELLIPTA) 200-62.5-25 MCG/ACT AEPB Inhale 1 puff into the lungs 2 (two) times daily.   gabapentin (NEURONTIN) 100 MG capsule Take 3 capsules (300 mg total) by mouth at bedtime.   glucose blood (CONTOUR NEXT TEST) test strip To check blood sugar once daily   hydrALAZINE (APRESOLINE) 50 MG tablet Take 1 tablet (50 mg total) by mouth every 8 (eight) hours.   insulin aspart (NOVOLOG) 100 UNIT/ML injection Inject 6 Units into the skin 3 (three) times daily with meals.   insulin glargine (LANTUS) 100 UNIT/ML injection Inject 0.12 mLs (12 Units total) into the skin at bedtime.   ipratropium-albuterol (DUONEB) 0.5-2.5 (3) MG/3ML SOLN Take 3 mLs by nebulization  every 6 (six) hours as needed.   levothyroxine (SYNTHROID) 75 MCG tablet Take 1 tablet (75 mcg total) by mouth daily before breakfast.   loperamide (IMODIUM A-D) 2 MG tablet Take 2-4 mg by mouth 4 (four) times daily as needed for diarrhea or loose stools.   meloxicam (MOBIC) 15 MG tablet Take 1 tablet (15 mg total) by mouth daily.   metFORMIN (GLUCOPHAGE-XR) 500 MG 24 hr tablet TAKE 2 TABLETS BY MOUTH ONCE A DAY WITH SUPPER. Please schedule office visit before any future refill.   metoprolol succinate (TOPROL-XL) 50 MG 24 hr tablet TAKE 1 TABLET BY MOUTH ONCE DAILY WITH OR IMMEDIATELY  FOLLOWING A MEAL   nicotine (NICODERM CQ - DOSED IN MG/24 HOURS) 21 mg/24hr patch Place 1 patch (21 mg total) onto the skin daily.   nicotine polacrilex (NICOTINE MINI) 2 MG lozenge Take 1 lozenge (2 mg total) by mouth every 2 (two) hours as needed for smoking cessation.   nystatin (MYCOSTATIN) 100000 UNIT/ML suspension Take 5 mLs (500,000 Units total) by mouth 4 (four) times daily.   nystatin (MYCOSTATIN/NYSTOP) powder Apply 1 application topically 3 (three) times daily.   oxybutynin (DITROPAN-XL) 5 MG 24 hr tablet Take 1 tablet (5 mg total) by mouth at bedtime.   primidone (MYSOLINE) 50 MG tablet Take 0.5 tablets (25 mg total) by mouth 2 (two) times daily.   rosuvastatin (CRESTOR) 10 MG tablet Take 1 tablet (10 mg total) by mouth daily.   sodium chloride 1 g tablet Take 1 tablet (1 g total) by mouth 2 (two) times daily with a meal.   No facility-administered medications prior to visit.    Review of Systems   Objective    Wt 207 lb (93.9 kg)   BMI 31.47 kg/m   Physical Exam Vitals and nursing note reviewed.  Constitutional:      Appearance: Normal appearance. She is obese.  Pulmonary:     Effort: Pulmonary effort is normal.  Abdominal:     Comments: Endorses constipation and nausea  Neurological:     General: No focal deficit present.     Mental Status: She is alert and oriented to person, place, and time. Mental status is at baseline.  Psychiatric:        Mood and Affect: Mood normal.        Behavior: Behavior normal.        Thought Content: Thought content normal.        Judgment: Judgment normal.        Assessment & Plan     Problem List Items Addressed This Visit       Digestive   Chronic idiopathic constipation    Recommend small, bland meals with good PO intake Continue to move your body to assist Add fiber Trial of reglan to assist with both motility and nausea        Other   Nausea - Primary    Variable, acute/stable x1 month Trial of reglan to  assist Endorses some constipation  Denies vomiting       Nervous indigestion    Trial of PPI to assist; pt reports health concerns worsening with her spouse and she has her own health issues and it's been a trying few weeks Denies further assistance in relation to mood; consented for safety- will reach back out if needed         Return if symptoms worsen or fail to improve.     I discussed the assessment and treatment plan with  the patient. The patient was provided an opportunity to ask questions and all were answered. The patient agreed with the plan and demonstrated an understanding of the instructions.   The patient was advised to call back or seek an in-person evaluation if the symptoms worsen or if the condition fails to improve as anticipated.  I provided 10 minutes of face-to-face time during this encounter discussing current symptoms and plan of care for   I, [name], FNP, have reviewed all documentation for this visit. The documentation on 07/20/22 for the exam, diagnosis, procedures, and orders are all accurate and complete.   [name], [place] [phone] (phone) [phone] (fax)  [place]

## 2022-07-20 NOTE — Assessment & Plan Note (Signed)
Variable, acute/stable x1 month Trial of reglan to assist Endorses some constipation  Denies vomiting

## 2022-07-20 NOTE — Telephone Encounter (Signed)
duplicate

## 2022-08-02 ENCOUNTER — Telehealth: Payer: Self-pay

## 2022-08-02 NOTE — Telephone Encounter (Signed)
Dr. Marius Ditch is not going to be in the office on 08/17/2022. Called patient and patient would like to move her colonoscopy to 08/22/2022. Sent out new instructions to patient.

## 2022-08-03 ENCOUNTER — Other Ambulatory Visit: Payer: Self-pay | Admitting: Physician Assistant

## 2022-08-09 ENCOUNTER — Other Ambulatory Visit: Payer: Self-pay | Admitting: Family Medicine

## 2022-08-09 DIAGNOSIS — E119 Type 2 diabetes mellitus without complications: Secondary | ICD-10-CM

## 2022-08-09 NOTE — Telephone Encounter (Signed)
Requested Prescriptions  Pending Prescriptions Disp Refills   metFORMIN (GLUCOPHAGE-XR) 500 MG 24 hr tablet [Pharmacy Med Name: METFORMIN HCL ER 500 MG TAB] 180 tablet 0    Sig: TAKE 2 TABLETS BY MOUTH ONCE A DAY WITH SUPPER     Endocrinology:  Diabetes - Biguanides Failed - 08/09/2022 12:40 PM      Failed - Cr in normal range and within 360 days    Creat  Date Value Ref Range Status  05/19/2017 0.58 0.50 - 0.99 mg/dL Final    Comment:    For patients >88 years of age, the reference limit for Creatinine is approximately 13% higher for people identified as African-American. .    Creatinine, Ser  Date Value Ref Range Status  07/12/2022 0.53 (L) 0.57 - 1.00 mg/dL Final         Failed - HBA1C is between 0 and 7.9 and within 180 days    Hgb A1c MFr Bld  Date Value Ref Range Status  07/01/2022 8.2 (H) 4.8 - 5.6 % Final    Comment:    (NOTE)         Prediabetes: 5.7 - 6.4         Diabetes: >6.4         Glycemic control for adults with diabetes: <7.0          Passed - eGFR in normal range and within 360 days    GFR, Est African American  Date Value Ref Range Status  05/19/2017 115 > OR = 60 mL/min/1.72m Final   GFR calc Af Amer  Date Value Ref Range Status  07/06/2019 >60 >60 mL/min Final   GFR, Est Non African American  Date Value Ref Range Status  05/19/2017 99 > OR = 60 mL/min/1.722mFinal   GFR, Estimated  Date Value Ref Range Status  07/02/2022 >60 >60 mL/min Final    Comment:    (NOTE) Calculated using the CKD-EPI Creatinine Equation (2021)    eGFR  Date Value Ref Range Status  07/12/2022 102 >59 mL/min/1.73 Final         Passed - B12 Level in normal range and within 720 days    Vitamin B-12  Date Value Ref Range Status  06/29/2022 382 180 - 914 pg/mL Final    Comment:    (NOTE) This assay is not validated for testing neonatal or myeloproliferative syndrome specimens for Vitamin B12 levels. Performed at [facility]  [place] [id number]        Passed - Valid encounter within last 6 months    Recent Outpatient Visits           2 weeks ago [facility]ause[facility]   [facility]   [facility] weeks [facility]   8 months ago En[facility] for health maintenance examination   [facility] [place]   10 months ago [place] [facility]  [facility]   10 months ago Dyspnea on exertion   [facility] SonsMD       Future Appointments             In 1 month PaGwyneth SproutFNCrawfordPEM[place] CBC within normal limits and  completed in the last 12 months    WBC  Date Value Ref Range Status  07/12/2022 7.2 3.4 - 10.8 x10E3/uL Final  07/02/2022 13.2 (H) 4.0 - 10.5 K/uL Final   RBC  Date Value Ref Range Status  07/12/2022 4.38 3.77 - 5.28 x10E6/uL Final  07/02/2022 4.59 3.87 - 5.11 MIL/uL Final   Hemoglobin  Date Value Ref Range Status  07/12/2022 13.5 11.1 - 15.9 g/dL Final   Hematocrit  Date Value Ref Range Status  07/12/2022 40.5 34.0 - 46.6 % Final   MCHC  Date Value Ref Range Status  07/12/2022 33.3 31.5 - 35.7 g/dL Final  07/02/2022 35.2 30.0 - 36.0 g/dL Final   [facility]  Date Value Ref Range Status  07/12/2022 30.8 26.6 - 33.0 pg Final  07/02/2022 31.2 26.0 - 34.0 pg Final   MCV  Date Value Ref Range Status  07/12/2022 93 79 - 97 fL Final   No results found for: "PLTCOUNTKUC", "LABPLAT", "POCPLA" RDW  Date Value Ref Range Status  07/12/2022 14.2 11.7 - 15.4 % Final

## 2022-08-10 ENCOUNTER — Telehealth: Payer: Self-pay | Admitting: *Deleted

## 2022-08-10 NOTE — Telephone Encounter (Signed)
Patient called office to cancel her colonoscopy which was schedule on 08/22/2022 due to her husband was in the hospital.  Patient will call office back when she will be able to.

## 2022-08-15 ENCOUNTER — Other Ambulatory Visit: Payer: Self-pay | Admitting: Family Medicine

## 2022-08-15 DIAGNOSIS — E039 Hypothyroidism, unspecified: Secondary | ICD-10-CM

## 2022-08-15 DIAGNOSIS — F331 Major depressive disorder, recurrent, moderate: Secondary | ICD-10-CM

## 2022-08-15 DIAGNOSIS — J441 Chronic obstructive pulmonary disease with (acute) exacerbation: Secondary | ICD-10-CM

## 2022-08-15 DIAGNOSIS — I10 Essential (primary) hypertension: Secondary | ICD-10-CM

## 2022-08-15 DIAGNOSIS — G250 Essential tremor: Secondary | ICD-10-CM

## 2022-08-15 DIAGNOSIS — E1169 Type 2 diabetes mellitus with other specified complication: Secondary | ICD-10-CM

## 2022-08-15 DIAGNOSIS — E119 Type 2 diabetes mellitus without complications: Secondary | ICD-10-CM

## 2022-08-15 DIAGNOSIS — F411 Generalized anxiety disorder: Secondary | ICD-10-CM

## 2022-08-15 DIAGNOSIS — Z9109 Other allergy status, other than to drugs and biological substances: Secondary | ICD-10-CM

## 2022-08-16 ENCOUNTER — Other Ambulatory Visit: Payer: Self-pay

## 2022-08-16 ENCOUNTER — Other Ambulatory Visit: Payer: Self-pay | Admitting: Family Medicine

## 2022-08-16 DIAGNOSIS — E119 Type 2 diabetes mellitus without complications: Secondary | ICD-10-CM

## 2022-08-16 NOTE — Telephone Encounter (Signed)
[facility] faxed refill request for the following medications:  metFORMIN (GLUCOPHAGE-XR) 500 MG 24 hr tablet   albuterol (VENTOLIN HFA) 108 (90 Base) MCG/ACT inhaler    levothyroxine (SYNTHROID) 75 MCG tablet   Fluticasone-Umeclidin-Vilant (TRELEGY ELLIPTA) 200-62.5-25 MCG/ACT AEPB   Metaxalone 800 mg tablet  rosuvastatin (CRESTOR) 10 MG tablet  metoprolol succinate (TOPROL-XL) 50 MG 24 hr tablet   buPROPion (WELLBUTRIN XL) 300 MG 24 hr tablet   pantoprazole (PROTONIX) 20 MG tablet   metoCLOPramide (REGLAN) 10 MG tablet   traZODone (DESYREL) 100 MG tablet   fluticasone (FLONASE) 50 MCG/ACT nasal spray    Please advise.

## 2022-08-16 NOTE — Telephone Encounter (Signed)
Requested Prescriptions  Pending Prescriptions Disp Refills   pantoprazole (PROTONIX) 20 MG tablet [Pharmacy Med Name: PANTOPRAZOLE '20MG'$  TABLETS] 180 tablet 0    Sig: TAKE 1 TABLET(20 MG) BY MOUTH TWICE DAILY BEFORE A MEAL     Gastroenterology: Proton Pump Inhibitors Passed - 08/16/2022 10:32 AM      Passed - Valid encounter within last 12 months    Recent Outpatient Visits           3 weeks ago Nausea   [facility] Tally Joe T, FNP   1 month ago Diabetes mellitus type 2 with neurological manifestations [facility])   [facility] [name], FNP   8 months ago Encounter for health maintenance examination   [facility] Thedore Mins, [place], PA-C   10 months ago [place] mouth and esophagus [facility])   [facility] Tally Joe T, FNP   10 months ago Dyspnea on exertion   [facility] Birdie Sons, MD       Future Appointments             In 1 month [name], [place], P[facility]

## 2022-08-17 MED ORDER — METOPROLOL SUCCINATE ER 50 MG PO TB24: ORAL_TABLET | ORAL | 0 refills | Status: DC

## 2022-08-17 MED ORDER — FLUTICASONE PROPIONATE 50 MCG/ACT NA SUSP: 2.0000 | Freq: Every day | NASAL | 1 refills | Status: DC

## 2022-08-17 MED ORDER — ROSUVASTATIN CALCIUM 10 MG PO TABS: 10.0000 mg | ORAL_TABLET | Freq: Every day | ORAL | 3 refills | Status: DC

## 2022-08-17 MED ORDER — TRELEGY ELLIPTA 200-62.5-25 MCG/ACT IN AEPB: 1.0000 | INHALATION_SPRAY | Freq: Two times a day (BID) | RESPIRATORY_TRACT | 11 refills | Status: DC

## 2022-08-17 MED ORDER — LEVOTHYROXINE SODIUM 75 MCG PO TABS: 75.0000 ug | ORAL_TABLET | Freq: Every day | ORAL | 3 refills | Status: DC

## 2022-08-17 MED ORDER — ALBUTEROL SULFATE HFA 108 (90 BASE) MCG/ACT IN AERS: 2.0000 | INHALATION_SPRAY | RESPIRATORY_TRACT | 3 refills | Status: DC | PRN

## 2022-08-17 MED ORDER — METOCLOPRAMIDE HCL 10 MG PO TABS: 10.0000 mg | ORAL_TABLET | Freq: Three times a day (TID) | ORAL | 0 refills | Status: DC | PRN

## 2022-08-17 MED ORDER — METFORMIN HCL ER 500 MG PO TB24: ORAL_TABLET | ORAL | 0 refills | Status: DC

## 2022-08-17 MED ORDER — BUPROPION HCL ER (XL) 300 MG PO TB24: 300.0000 mg | ORAL_TABLET | Freq: Every day | ORAL | 3 refills | Status: DC

## 2022-08-18 ENCOUNTER — Other Ambulatory Visit: Payer: Self-pay

## 2022-08-18 DIAGNOSIS — N3946 Mixed incontinence: Secondary | ICD-10-CM

## 2022-08-18 MED ORDER — OXYBUTYNIN CHLORIDE ER 5 MG PO TB24: 5.0000 mg | ORAL_TABLET | Freq: Every day | ORAL | 5 refills | Status: DC

## 2022-08-19 ENCOUNTER — Other Ambulatory Visit: Payer: Self-pay | Admitting: Family Medicine

## 2022-08-19 MED ORDER — TRELEGY ELLIPTA 100-62.5-25 MCG/ACT IN AEPB: 1.0000 | INHALATION_SPRAY | Freq: Every day | RESPIRATORY_TRACT | 11 refills | Status: DC

## 2022-08-22 ENCOUNTER — Encounter: Admission: RE | Payer: Self-pay | Source: Home / Self Care

## 2022-08-22 ENCOUNTER — Ambulatory Visit: Admission: RE | Admit: 2022-08-22 | Payer: Medicare PPO | Source: Home / Self Care | Admitting: Gastroenterology

## 2022-08-22 SURGERY — COLONOSCOPY WITH PROPOFOL
Anesthesia: General

## 2022-08-23 ENCOUNTER — Encounter: Payer: Self-pay | Admitting: Family Medicine

## 2022-08-23 ENCOUNTER — Other Ambulatory Visit: Payer: Self-pay | Admitting: Family Medicine

## 2022-08-29 ENCOUNTER — Encounter: Payer: Self-pay | Admitting: Family Medicine

## 2022-08-29 ENCOUNTER — Ambulatory Visit: Payer: Medicare PPO | Admitting: Family Medicine

## 2022-08-29 VITALS — BP 136/61 | HR 71 | Temp 97.5°F | Wt 205.9 lb

## 2022-08-29 DIAGNOSIS — E1159 Type 2 diabetes mellitus with other circulatory complications: Secondary | ICD-10-CM

## 2022-08-29 DIAGNOSIS — J418 Mixed simple and mucopurulent chronic bronchitis: Secondary | ICD-10-CM | POA: Diagnosis not present

## 2022-08-29 DIAGNOSIS — F32A Depression, unspecified: Secondary | ICD-10-CM

## 2022-08-29 DIAGNOSIS — E1149 Type 2 diabetes mellitus with other diabetic neurological complication: Secondary | ICD-10-CM | POA: Diagnosis not present

## 2022-08-29 DIAGNOSIS — I152 Hypertension secondary to endocrine disorders: Secondary | ICD-10-CM

## 2022-08-29 MED ORDER — DULOXETINE HCL 60 MG PO CPEP: 60.0000 mg | ORAL_CAPSULE | Freq: Two times a day (BID) | ORAL | 1 refills | Status: DC

## 2022-08-29 MED ORDER — IPRATROPIUM-ALBUTEROL 0.5-2.5 (3) MG/3ML IN SOLN: 3.0000 mL | Freq: Four times a day (QID) | RESPIRATORY_TRACT | 11 refills | Status: AC | PRN

## 2022-08-29 MED ORDER — AMLODIPINE BESYLATE 10 MG PO TABS: ORAL_TABLET | ORAL | 1 refills | Status: DC

## 2022-08-29 MED ORDER — METFORMIN HCL ER 500 MG PO TB24: 1000.0000 mg | ORAL_TABLET | Freq: Two times a day (BID) | ORAL | 1 refills | Status: DC

## 2022-08-29 MED ORDER — MONTELUKAST SODIUM 10 MG PO TABS: 10.0000 mg | ORAL_TABLET | Freq: Every day | ORAL | 1 refills | Status: DC

## 2022-08-29 MED ORDER — GABAPENTIN 300 MG PO CAPS: 300.0000 mg | ORAL_CAPSULE | Freq: Every day | ORAL | 1 refills | Status: DC

## 2022-08-29 NOTE — Assessment & Plan Note (Signed)
Chronic, stable Declines changes at this time Reports "my house is a bad house" with her husband and her ill SIL there Continue cymbalta 60 mg to assist

## 2022-08-29 NOTE — Assessment & Plan Note (Signed)
Chronic, stable Wheezing heard throughout Request for new neb machine- hand written Rx provided Refills provided on duoneb

## 2022-08-29 NOTE — Assessment & Plan Note (Addendum)
Chronic, recently elevated during hospital course Declined use of insulin; titrate metformin from 1000 mg daily to 1000 mg BID with meals Repeat A1c in 3 months from previous Continue to recommend balanced, lower carb meals. Smaller meal size, adding snacks. Choosing water as drink of choice and increasing purposeful exercise. Request for refill of gabapentin at 300 mg qHS

## 2022-08-29 NOTE — Assessment & Plan Note (Signed)
Chronic, elevated on home readings Was placed on both norvasc as well as hydral Pt did not start either Recommend start of norvasc with slow titration from 5 mg to 10 mg as needed Goal BP <130/<80

## 2022-08-29 NOTE — Progress Notes (Signed)
I,Connie R Striblin,acting as a Education administrator for [name], FNP.,have documented all relevant documentation on the behalf of [name], FNP,as directed by  [name], FNP while in the presence of [name], FNP.   Established patient visit   Patient: Linda Bradford   DOB: Jan 25, 1956   67 y.o. Female  MRN: [id number] Visit Date: 08/29/2022  Today's healthcare provider: [name], FNP  Introduced to nurse practitioner role and practice setting.  All questions answered.  Discussed provider/patient relationship and expectations.  Subjective    HPI Hypertension, follow-up  BP Readings from Last 3 Encounters:  08/29/22 136/61  07/12/22 115/74  07/02/22 (!) 130/50   Wt Readings from Last 3 Encounters:  08/29/22 205 lb 14.4 oz (93.4 kg)  07/20/22 207 lb (93.9 kg)  07/12/22 207 lb (93.9 kg)     She was last seen for hypertension 1 months ago.  BP at that visit was 115/74. Management since that visit includes endorsing medication compliance with norvasc 10 mg, toprol 50 mg .  She reports good compliance with treatment. She is not having side effects.  She is following a Regular diet. She is not exercising. She does smoke- 2 packs per day  Use of agents associated with hypertension: none  Outside blood pressures are around 180/95  Symptoms: include headache and dizziness  Pertinent labs Lab Results  Component Value Date   CHOL 169 12/07/2021   HDL 60 12/07/2021   LDLCALC 83 12/07/2021   TRIG 149 12/07/2021   CHOLHDL 2.8 12/07/2021   Lab Results  Component Value Date   NA 139 07/12/2022   K 4.9 07/12/2022   CREATININE 0.53 (L) 07/12/2022   EGFR 102 07/12/2022   GLUCOSE 117 (H) 07/12/2022   TSH 2.384 06/29/2022     The 10-year ASCVD risk score (Arnett DK, et al., 2019) is: 26.4%  ---------------------------------------------------------------------------------------------------    Medications: Outpatient Medications Prior to Visit  Medication Sig    acetaminophen (TYLENOL) 500 MG tablet Take 1,000 mg by mouth every 6 (six) hours as needed for mild pain or moderate pain.    aspirin 81 MG chewable tablet Chew 1 tablet (81 mg total) by mouth 2 (two) times daily.   benzonatate (TESSALON) 200 MG capsule TAKE 1 CAPSULE BY MOUTH 3 TIMES DAILY ASNEEDED   blood glucose meter kit and supplies Dispense based on patient and insurance preference. Once daily. (FOR ICD-10 E10.9, E11.9).   buPROPion (WELLBUTRIN XL) 300 MG 24 hr tablet Take 1 tablet (300 mg total) by mouth daily.   Cholecalciferol (VITAMIN D) 50 MCG (2000 UT) tablet Take 2,000 Units by mouth daily.   diclofenac (VOLTAREN) 50 MG EC tablet    diclofenac Sodium (VOLTAREN) 1 % GEL    fluticasone (FLONASE) 50 MCG/ACT nasal spray Place 2 sprays into both nostrils daily.   Fluticasone-Umeclidin-Vilant (TRELEGY ELLIPTA) 100-62.5-25 MCG/ACT AEPB Inhale 1 puff into the lungs daily.   glucose blood (CONTOUR NEXT TEST) test strip To check blood sugar once daily   levothyroxine (SYNTHROID) 75 MCG tablet Take 1 tablet (75 mcg total) by mouth daily before breakfast.   loperamide (IMODIUM A-D) 2 MG tablet Take 2-4 mg by mouth 4 (four) times daily as needed for diarrhea or loose stools.   meloxicam (MOBIC) 15 MG tablet Take 1 tablet (15 mg total) by mouth daily.   metoCLOPramide (REGLAN) 10 MG tablet Take 1 tablet (10 mg total) by mouth every 8 (eight) hours as needed for nausea.  metoprolol succinate (TOPROL-XL) 50 MG 24 hr tablet TAKE 1 TABLET BY MOUTH ONCE DAILY WITH OR IMMEDIATELY FOLLOWING A MEAL   nicotine polacrilex (NICOTINE MINI) 2 MG lozenge Take 1 lozenge (2 mg total) by mouth every 2 (two) hours as needed for smoking cessation.   nystatin (MYCOSTATIN) 100000 UNIT/ML suspension Take 5 mLs (500,000 Units total) by mouth 4 (four) times daily.   nystatin (MYCOSTATIN/NYSTOP) powder Apply 1 application topically 3 (three) times daily.   oxybutynin (DITROPAN-XL) 5 MG 24 hr tablet Take 1 tablet (5 mg  total) by mouth at bedtime.   pantoprazole (PROTONIX) 20 MG tablet TAKE 1 TABLET(20 MG) BY MOUTH TWICE DAILY BEFORE A MEAL   primidone (MYSOLINE) 50 MG tablet Take 0.5 tablets (25 mg total) by mouth 2 (two) times daily.   rosuvastatin (CRESTOR) 10 MG tablet Take 1 tablet (10 mg total) by mouth daily.   sodium chloride 1 g tablet Take 1 tablet (1 g total) by mouth 2 (two) times daily with a meal.   [DISCONTINUED] albuterol (VENTOLIN HFA) 108 (90 Base) MCG/ACT inhaler Inhale 2 puffs into the lungs every 4 (four) hours as needed for wheezing or shortness of breath.   [DISCONTINUED] DULoxetine (CYMBALTA) 60 MG capsule TAKE 1 CAPSULE BY MOUTH TWICE DAILY   [DISCONTINUED] gabapentin (NEURONTIN) 100 MG capsule Take 3 capsules (300 mg total) by mouth at bedtime.   [DISCONTINUED] hydrALAZINE (APRESOLINE) 50 MG tablet Take 1 tablet (50 mg total) by mouth every 8 (eight) hours.   [DISCONTINUED] ipratropium-albuterol (DUONEB) 0.5-2.5 (3) MG/3ML SOLN Take 3 mLs by nebulization every 6 (six) hours as needed.   [DISCONTINUED] metFORMIN (GLUCOPHAGE-XR) 500 MG 24 hr tablet TAKE 2 TABLETS BY MOUTH ONCE A DAY WITH SUPPER   theophylline (THEODUR) 300 MG 12 hr tablet TAKE 1 TABLET BY MOUTH DAILY (Patient not taking: Reported on 08/29/2022)   [DISCONTINUED] amLODipine (NORVASC) 10 MG tablet Take 1 tablet (10 mg total) by mouth daily. (Patient not taking: Reported on 08/29/2022)   [DISCONTINUED] insulin aspart (NOVOLOG) 100 UNIT/ML injection Inject 6 Units into the skin 3 (three) times daily with meals. (Patient not taking: Reported on 08/29/2022)   [DISCONTINUED] insulin glargine (LANTUS) 100 UNIT/ML injection Inject 0.12 mLs (12 Units total) into the skin at bedtime. (Patient not taking: Reported on 08/29/2022)   No facility-administered medications prior to visit.    Review of Systems    Objective    BP 136/61 (BP Location: Right Arm, Patient Position: Sitting, Cuff Size: Normal)   Pulse 71   Temp (!) 97.5 F (36.4  C) (Oral)   Wt 205 lb 14.4 oz (93.4 kg)   SpO2 99%   BMI 31.31 kg/m   Physical Exam Vitals and nursing note reviewed.  Constitutional:      General: She is not in acute distress.    Appearance: Normal appearance. She is obese. She is not ill-appearing, toxic-appearing or diaphoretic.  HENT:     Head: Normocephalic and atraumatic.  Cardiovascular:     Rate and Rhythm: Normal rate and regular rhythm.     Pulses: Normal pulses.     Heart sounds: Normal heart sounds. No murmur heard.    No friction rub. No gallop.  Pulmonary:     Effort: Pulmonary effort is normal. No respiratory distress.     Breath sounds: No stridor. Wheezing present. No rhonchi or rales.  Chest:     Chest wall: No tenderness.  Abdominal:     General: Bowel sounds are normal.  Palpations: Abdomen is soft.  Musculoskeletal:        General: No swelling, tenderness, deformity or signs of injury. Normal range of motion.     Right lower leg: No edema.     Left lower leg: No edema.  Skin:    General: Skin is warm and dry.     Capillary Refill: Capillary refill takes less than 2 seconds.     Coloration: Skin is not jaundiced or pale.     Findings: No bruising, erythema, lesion or rash.  Neurological:     General: No focal deficit present.     Mental Status: She is alert and oriented to person, place, and time. Mental status is at baseline.     Cranial Nerves: No cranial nerve deficit.     Sensory: No sensory deficit.     Motor: No weakness.     Coordination: Coordination normal.  Psychiatric:        Mood and Affect: Mood normal.        Behavior: Behavior normal.        Thought Content: Thought content normal.        Judgment: Judgment normal.      No results found for any visits on 08/29/22.  Assessment & Plan     Problem List Items Addressed This Visit       Cardiovascular and Mediastinum   Hypertension associated with type 2 diabetes mellitus ([place]) - Primary    Chronic, elevated on home  readings Was placed on both norvasc as well as hydral Pt did not start either Recommend start of norvasc with slow titration from 5 mg to 10 mg as needed Goal BP <130/<80      Relevant Medications   amLODipine (NORVASC) 10 MG tablet   metFORMIN (GLUCOPHAGE-XR) 500 MG 24 hr tablet     Respiratory   Bronchitis, chronic (HCC)    Chronic, stable Wheezing heard throughout Request for new neb machine- hand written Rx provided Refills provided on duoneb      Relevant Medications   montelukast (SINGULAIR) 10 MG tablet   ipratropium-albuterol (DUONEB) 0.5-2.5 (3) MG/3ML SOLN     Endocrine   Diabetes mellitus type 2 with neurological manifestations (HCC)    Chronic, recently elevated during hospital course Declined use of insulin; titrate metformin from 1000 mg daily to 1000 mg BID with meals Repeat A1c in 3 months from previous Continue to recommend balanced, lower carb meals. Smaller meal size, adding snacks. Choosing water as drink of choice and increasing purposeful exercise. Request for refill of gabapentin at 300 mg qHS       Relevant Medications   metFORMIN (GLUCOPHAGE-XR) 500 MG 24 hr tablet   gabapentin (NEURONTIN) 300 MG capsule     Other   Depression    Chronic, stable Declines changes at this time Reports "my house is a bad house" with her husband and her ill SIL there Continue cymbalta 60 mg to assist       Relevant Medications   DULoxetine (CYMBALTA) 60 MG capsule   Return in about 5 weeks (around 10/03/2022) for HTN management.     Vonna Kotyk, FNP, have reviewed all documentation for this visit. The documentation on 08/29/22 for the exam, diagnosis, procedures, and orders are all accurate and complete.  [name], [place] [phone] (phone) [phone] (fax)  [place]

## 2022-08-31 ENCOUNTER — Other Ambulatory Visit: Payer: Self-pay | Admitting: Family Medicine

## 2022-08-31 DIAGNOSIS — E1149 Type 2 diabetes mellitus with other diabetic neurological complication: Secondary | ICD-10-CM

## 2022-08-31 MED ORDER — METFORMIN HCL ER 500 MG PO TB24: 1000.0000 mg | ORAL_TABLET | Freq: Two times a day (BID) | ORAL | 1 refills | Status: DC

## 2022-09-01 ENCOUNTER — Other Ambulatory Visit: Payer: Self-pay | Admitting: Family Medicine

## 2022-09-01 DIAGNOSIS — E1159 Type 2 diabetes mellitus with other circulatory complications: Secondary | ICD-10-CM

## 2022-09-01 MED ORDER — AMLODIPINE BESYLATE 10 MG PO TABS: 10.0000 mg | ORAL_TABLET | Freq: Every day | ORAL | 1 refills | Status: DC

## 2022-09-08 LAB — HM DIABETES EYE EXAM

## 2022-09-29 NOTE — Progress Notes (Deleted)
Established patient visit   Patient: Linda Bradford   DOB: 1956/07/06   67 y.o. Female  MRN: [id number] Visit Date: 09/30/2022  Today's healthcare provider: [name], FNP   No chief complaint on file.  Subjective    HPI  Hypertension, follow-up  BP Readings from Last 3 Encounters:  08/29/22 136/61  07/12/22 115/74  07/02/22 (!) 130/50   Wt Readings from Last 3 Encounters:  08/29/22 205 lb 14.4 oz (93.4 kg)  07/20/22 207 lb (93.9 kg)  07/12/22 207 lb (93.9 kg)     She was last seen for hypertension 5 weeks ago.  BP at that visit was 136/61. Management since that visit includes start of norvasc with slow titration from 5 mg to 10 mg as needed .  She reports {excellent/good/fair/poor:19665} compliance with treatment. She {is/is not:9024} having side effects. {document side effects if present:1}  She does smoke.  Outside blood pressures are {***enter patient reported home BP readings, or 'not being checked':1}. Symptoms: {Yes/No:20286} chest pain {Yes/No:20286} chest pressure  {Yes/No:20286} palpitations {Yes/No:20286} syncope  {Yes/No:20286} dyspnea {Yes/No:20286} orthopnea  {Yes/No:20286} paroxysmal nocturnal dyspnea {Yes/No:20286} lower extremity edema   Pertinent labs Lab Results  Component Value Date   CHOL 169 12/07/2021   HDL 60 12/07/2021   LDLCALC 83 12/07/2021   TRIG 149 12/07/2021   CHOLHDL 2.8 12/07/2021   Lab Results  Component Value Date   NA 139 07/12/2022   K 4.9 07/12/2022   CREATININE 0.53 (L) 07/12/2022   EGFR 102 07/12/2022   GLUCOSE 117 (H) 07/12/2022   TSH 2.384 06/29/2022     The 10-year ASCVD risk score (Arnett DK, et al., 2019) is: 21.2%  ---------------------------------------------------------------------------------------------------  Diabetes Mellitus Type II, Follow-up  Lab Results  Component Value Date   HGBA1C 8.2 (H) 07/01/2022   HGBA1C 6.6 (H) 12/07/2021   HGBA1C 6.8 (H) 09/02/2021   Wt Readings from Last 3  Encounters:  08/29/22 205 lb 14.4 oz (93.4 kg)  07/20/22 207 lb (93.9 kg)  07/12/22 207 lb (93.9 kg)   Last seen for diabetes 3 weeks ago.  Management since then includes titrate metformin from 1000 mg daily to 1000 mg BID with meals . She reports {excellent/good/fair/poor:19665} compliance with treatment. She {is/is not:21021397} having side effects. {document side effects if present:1} Symptoms: {Yes/No:20286} fatigue {Yes/No:20286} foot ulcerations  {Yes/No:20286} appetite changes {Yes/No:20286} nausea  {Yes/No:20286} paresthesia of the feet  {Yes/No:20286} polydipsia  {Yes/No:20286} polyuria {Yes/No:20286} visual disturbances   {Yes/No:20286} vomiting     Home blood sugar records: {diabetes glucometry results:16657}  Episodes of hypoglycemia? {Yes/No:20286} {enter symptoms and frequency of symptoms if yes:1}   Current insulin regiment: {enter 'none' or type of insulin and number of units taken with each dose of each insulin formulation that the patient is taking:1} Most Recent Eye Exam: 09/08/2022  Pertinent Labs: Lab Results  Component Value Date   CHOL 169 12/07/2021   HDL 60 12/07/2021   LDLCALC 83 12/07/2021   TRIG 149 12/07/2021   CHOLHDL 2.8 12/07/2021   Lab Results  Component Value Date   NA 139 07/12/2022   K 4.9 07/12/2022   CREATININE 0.53 (L) 07/12/2022   EGFR 102 07/12/2022   LABMICR 25.3 07/12/2022   MICRALBCREAT 23 07/12/2022     ---------------------------------------------------------------------------------------------------  Medications: Outpatient Medications Prior to Visit  Medication Sig   acetaminophen (TYLENOL) 500 MG tablet Take 1,000 mg by mouth every 6 (six) hours as needed for mild pain or moderate pain.    amLODipine (  NORVASC) 10 MG tablet Take 1 tablet (10 mg total) by mouth daily. Goal BP <130/<80   aspirin 81 MG chewable tablet Chew 1 tablet (81 mg total) by mouth 2 (two) times daily.   benzonatate (TESSALON) 200 MG capsule TAKE 1  CAPSULE BY MOUTH 3 TIMES DAILY ASNEEDED   blood glucose meter kit and supplies Dispense based on patient and insurance preference. Once daily. (FOR ICD-10 E10.9, E11.9).   buPROPion (WELLBUTRIN XL) 300 MG 24 hr tablet Take 1 tablet (300 mg total) by mouth daily.   Cholecalciferol (VITAMIN D) 50 MCG (2000 UT) tablet Take 2,000 Units by mouth daily.   diclofenac (VOLTAREN) 50 MG EC tablet    diclofenac Sodium (VOLTAREN) 1 % GEL    DULoxetine (CYMBALTA) 60 MG capsule Take 1 capsule (60 mg total) by mouth 2 (two) times daily.   fluticasone (FLONASE) 50 MCG/ACT nasal spray Place 2 sprays into both nostrils daily.   Fluticasone-Umeclidin-Vilant (TRELEGY ELLIPTA) 100-62.5-25 MCG/ACT AEPB Inhale 1 puff into the lungs daily.   gabapentin (NEURONTIN) 300 MG capsule Take 1 capsule (300 mg total) by mouth at bedtime.   glucose blood (CONTOUR NEXT TEST) test strip To check blood sugar once daily   ipratropium-albuterol (DUONEB) 0.5-2.5 (3) MG/3ML SOLN Take 3 mLs by nebulization every 6 (six) hours as needed.   levothyroxine (SYNTHROID) 75 MCG tablet Take 1 tablet (75 mcg total) by mouth daily before breakfast.   loperamide (IMODIUM A-D) 2 MG tablet Take 2-4 mg by mouth 4 (four) times daily as needed for diarrhea or loose stools.   meloxicam (MOBIC) 15 MG tablet Take 1 tablet (15 mg total) by mouth daily.   metFORMIN (GLUCOPHAGE-XR) 500 MG 24 hr tablet Take 2 tablets (1,000 mg total) by mouth 2 (two) times daily with a meal.   metoCLOPramide (REGLAN) 10 MG tablet Take 1 tablet (10 mg total) by mouth every 8 (eight) hours as needed for nausea.   metoprolol succinate (TOPROL-XL) 50 MG 24 hr tablet TAKE 1 TABLET BY MOUTH ONCE DAILY WITH OR IMMEDIATELY FOLLOWING A MEAL   montelukast (SINGULAIR) 10 MG tablet Take 1 tablet (10 mg total) by mouth at bedtime.   nystatin (MYCOSTATIN) 100000 UNIT/ML suspension Take 5 mLs (500,000 Units total) by mouth 4 (four) times daily.   nystatin (MYCOSTATIN/NYSTOP) powder Apply 1  application topically 3 (three) times daily.   oxybutynin (DITROPAN-XL) 5 MG 24 hr tablet Take 1 tablet (5 mg total) by mouth at bedtime.   pantoprazole (PROTONIX) 20 MG tablet TAKE 1 TABLET(20 MG) BY MOUTH TWICE DAILY BEFORE A MEAL   primidone (MYSOLINE) 50 MG tablet Take 0.5 tablets (25 mg total) by mouth 2 (two) times daily.   rosuvastatin (CRESTOR) 10 MG tablet Take 1 tablet (10 mg total) by mouth daily.   sodium chloride 1 g tablet Take 1 tablet (1 g total) by mouth 2 (two) times daily with a meal.   theophylline (THEODUR) 300 MG 12 hr tablet TAKE 1 TABLET BY MOUTH DAILY (Patient not taking: Reported on 08/29/2022)   No facility-administered medications prior to visit.    Review of Systems  {Labs  Heme  Chem  Endocrine  Serology  Results Review (optional):23779}   Objective    There were no vitals taken for this visit. {Show previous vital signs (optional):23777}  Physical Exam  ***  No results found for any visits on 09/30/22.  Assessment & Plan     ***  No follow-ups on file.      {provider  attestation***:1}   [name], [place] [phone] (phone) [phone] (fax)  [place]

## 2022-09-30 ENCOUNTER — Ambulatory Visit: Payer: Medicare PPO | Admitting: Family Medicine

## 2022-09-30 DIAGNOSIS — E1149 Type 2 diabetes mellitus with other diabetic neurological complication: Secondary | ICD-10-CM

## 2022-10-03 ENCOUNTER — Ambulatory Visit: Payer: Medicare PPO | Admitting: Family Medicine

## 2022-10-06 ENCOUNTER — Other Ambulatory Visit: Payer: Self-pay | Admitting: Family Medicine

## 2022-10-06 ENCOUNTER — Encounter: Payer: Self-pay | Admitting: Family Medicine

## 2022-10-06 ENCOUNTER — Ambulatory Visit: Payer: Medicare PPO | Admitting: Family Medicine

## 2022-10-06 VITALS — BP 135/50 | HR 84 | Ht 68.0 in | Wt 206.0 lb

## 2022-10-06 DIAGNOSIS — J42 Unspecified chronic bronchitis: Secondary | ICD-10-CM

## 2022-10-06 DIAGNOSIS — N644 Mastodynia: Secondary | ICD-10-CM | POA: Insufficient documentation

## 2022-10-06 DIAGNOSIS — E1149 Type 2 diabetes mellitus with other diabetic neurological complication: Secondary | ICD-10-CM | POA: Diagnosis not present

## 2022-10-06 DIAGNOSIS — F331 Major depressive disorder, recurrent, moderate: Secondary | ICD-10-CM

## 2022-10-06 DIAGNOSIS — Z5320 Procedure and treatment not carried out because of patient's decision for unspecified reasons: Secondary | ICD-10-CM | POA: Insufficient documentation

## 2022-10-06 DIAGNOSIS — E1159 Type 2 diabetes mellitus with other circulatory complications: Secondary | ICD-10-CM

## 2022-10-06 DIAGNOSIS — I152 Hypertension secondary to endocrine disorders: Secondary | ICD-10-CM

## 2022-10-06 LAB — POCT GLYCOSYLATED HEMOGLOBIN (HGB A1C): Hemoglobin A1C: 6.8 % — AB (ref 4.0–5.6)

## 2022-10-06 MED ORDER — AMLODIPINE BESYLATE 5 MG PO TABS: 5.0000 mg | ORAL_TABLET | Freq: Every day | ORAL | 0 refills | Status: DC

## 2022-10-06 NOTE — Progress Notes (Signed)
Established patient visit   Patient: Linda Bradford   DOB: November 10, 1955   67 y.o. Female  MRN: [id number] Visit Date: 10/06/2022  Today's healthcare provider: [name], FNP  Re Introduced to nurse practitioner role and practice setting.  All questions answered.  Discussed provider/patient relationship and expectations.  Subjective    HPI  Hypertension, follow-up  BP Readings from Last 3 Encounters:  10/06/22 (!) 135/50  08/29/22 136/61  07/12/22 115/74   Wt Readings from Last 3 Encounters:  10/06/22 206 lb (93.4 kg)  08/29/22 205 lb 14.4 oz (93.4 kg)  07/20/22 207 lb (93.9 kg)     She was last seen for hypertension 1 months ago.  BP at that visit was 136/61. Management since that visit includes advised to start Norvasc with slow titration from 5 mg to 10 mg as needed.  She reports excellent compliance with treatment. She is not having side effects.   Outside blood pressures are not being checked.  Pertinent labs Lab Results  Component Value Date   CHOL 169 12/07/2021   HDL 60 12/07/2021   LDLCALC 83 12/07/2021   TRIG 149 12/07/2021   CHOLHDL 2.8 12/07/2021   Lab Results  Component Value Date   NA 139 07/12/2022   K 4.9 07/12/2022   CREATININE 0.53 (L) 07/12/2022   EGFR 102 07/12/2022   GLUCOSE 117 (H) 07/12/2022   TSH 2.384 06/29/2022     The 10-year ASCVD risk score (Arnett DK, et al., 2019) is: 26%  ---------------------------------------------------------------------------------------------------   Medications: Outpatient Medications Prior to Visit  Medication Sig   acetaminophen (TYLENOL) 500 MG tablet Take 1,000 mg by mouth every 6 (six) hours as needed for mild pain or moderate pain.    aspirin 81 MG chewable tablet Chew 1 tablet (81 mg total) by mouth 2 (two) times daily.   blood glucose meter kit and supplies Dispense based on patient and insurance preference. Once daily. (FOR ICD-10 E10.9, E11.9).   buPROPion (WELLBUTRIN XL) 300 MG 24 hr tablet  Take 1 tablet (300 mg total) by mouth daily.   Cholecalciferol (VITAMIN D) 50 MCG (2000 UT) tablet Take 2,000 Units by mouth daily.   diclofenac (VOLTAREN) 50 MG EC tablet    diclofenac Sodium (VOLTAREN) 1 % GEL    DULoxetine (CYMBALTA) 60 MG capsule Take 1 capsule (60 mg total) by mouth 2 (two) times daily.   fluticasone (FLONASE) 50 MCG/ACT nasal spray Place 2 sprays into both nostrils daily.   Fluticasone-Umeclidin-Vilant (TRELEGY ELLIPTA) 100-62.5-25 MCG/ACT AEPB Inhale 1 puff into the lungs daily.   gabapentin (NEURONTIN) 300 MG capsule Take 1 capsule (300 mg total) by mouth at bedtime.   glucose blood (CONTOUR NEXT TEST) test strip To check blood sugar once daily   ipratropium-albuterol (DUONEB) 0.5-2.5 (3) MG/3ML SOLN Take 3 mLs by nebulization every 6 (six) hours as needed.   levothyroxine (SYNTHROID) 75 MCG tablet Take 1 tablet (75 mcg total) by mouth daily before breakfast.   loperamide (IMODIUM A-D) 2 MG tablet Take 2-4 mg by mouth 4 (four) times daily as needed for diarrhea or loose stools.   meloxicam (MOBIC) 15 MG tablet Take 1 tablet (15 mg total) by mouth daily.   metFORMIN (GLUCOPHAGE-XR) 500 MG 24 hr tablet Take 2 tablets (1,000 mg total) by mouth 2 (two) times daily with a meal.   metoCLOPramide (REGLAN) 10 MG tablet Take 1 tablet (10 mg total) by mouth every 8 (eight) hours as needed for nausea.   metoprolol succinate (  TOPROL-XL) 50 MG 24 hr tablet TAKE 1 TABLET BY MOUTH ONCE DAILY WITH OR IMMEDIATELY FOLLOWING A MEAL   montelukast (SINGULAIR) 10 MG tablet Take 1 tablet (10 mg total) by mouth at bedtime.   nystatin (MYCOSTATIN) 100000 UNIT/ML suspension Take 5 mLs (500,000 Units total) by mouth 4 (four) times daily.   nystatin (MYCOSTATIN/NYSTOP) powder Apply 1 application topically 3 (three) times daily.   oxybutynin (DITROPAN-XL) 5 MG 24 hr tablet Take 1 tablet (5 mg total) by mouth at bedtime.   pantoprazole (PROTONIX) 20 MG tablet TAKE 1 TABLET(20 MG) BY MOUTH TWICE DAILY  BEFORE A MEAL   primidone (MYSOLINE) 50 MG tablet Take 0.5 tablets (25 mg total) by mouth 2 (two) times daily.   rosuvastatin (CRESTOR) 10 MG tablet Take 1 tablet (10 mg total) by mouth daily.   sodium chloride 1 g tablet Take 1 tablet (1 g total) by mouth 2 (two) times daily with a meal.   theophylline (THEODUR) 300 MG 12 hr tablet TAKE 1 TABLET BY MOUTH DAILY   [DISCONTINUED] amLODipine (NORVASC) 10 MG tablet Take 1 tablet (10 mg total) by mouth daily. Goal BP <130/<80   benzonatate (TESSALON) 200 MG capsule TAKE 1 CAPSULE BY MOUTH 3 TIMES DAILY ASNEEDED (Patient not taking: Reported on 10/06/2022)   No facility-administered medications prior to visit.    Review of Systems  Last CBC Lab Results  Component Value Date   WBC 7.2 07/12/2022   HGB 13.5 07/12/2022   HCT 40.5 07/12/2022   MCV 93 07/12/2022   MCH 30.8 07/12/2022   RDW 14.2 07/12/2022   PLT 298 123456   Last metabolic panel Lab Results  Component Value Date   GLUCOSE 117 (H) 07/12/2022   NA 139 07/12/2022   K 4.9 07/12/2022   CL 100 07/12/2022   CO2 25 07/12/2022   BUN 7 (L) 07/12/2022   CREATININE 0.53 (L) 07/12/2022   EGFR 102 07/12/2022   CALCIUM 9.1 07/12/2022   PHOS 3.4 06/30/2022   PROT 8.1 07/02/2022   ALBUMIN 3.5 07/02/2022   LABGLOB 2.4 09/02/2021   AGRATIO 1.8 09/02/2021   BILITOT 0.8 07/02/2022   ALKPHOS 115 07/02/2022   AST 31 07/02/2022   ALT 32 07/02/2022   ANIONGAP 10 07/02/2022   Last lipids Lab Results  Component Value Date   CHOL 169 12/07/2021   HDL 60 12/07/2021   LDLCALC 83 12/07/2021   TRIG 149 12/07/2021   CHOLHDL 2.8 12/07/2021   Last hemoglobin A1c Lab Results  Component Value Date   HGBA1C 6.8 (A) 10/06/2022   Last thyroid functions Lab Results  Component Value Date   TSH 2.384 06/29/2022   T4TOTAL 10.3 04/06/2021   Last vitamin D Lab Results  Component Value Date   VD25OH 25.4 (L) 04/06/2021   Last vitamin B12 and Folate Lab Results  Component Value Date    VITAMINB12 382 06/29/2022       Objective    BP (!) 135/50 (BP Location: Left Arm, Patient Position: Sitting, Cuff Size: Large)   Pulse 84   Ht '5\' 8"'$  (1.727 m)   Wt 206 lb (93.4 kg)   SpO2 95%   BMI 31.32 kg/m   BP Readings from Last 3 Encounters:  10/06/22 (!) 135/50  08/29/22 136/61  07/12/22 115/74   Wt Readings from Last 3 Encounters:  10/06/22 206 lb (93.4 kg)  08/29/22 205 lb 14.4 oz (93.4 kg)  07/20/22 207 lb (93.9 kg)   SpO2 Readings from Last 3 Encounters:  10/06/22 95%  08/29/22 99%  07/02/22 94%      Physical Exam Vitals and nursing note reviewed.  Constitutional:      General: She is not in acute distress.    Appearance: Normal appearance. She is obese. She is not ill-appearing, toxic-appearing or diaphoretic.  HENT:     Head: Normocephalic and atraumatic.  Cardiovascular:     Rate and Rhythm: Normal rate and regular rhythm.     Pulses: Normal pulses.     Heart sounds: Normal heart sounds. No murmur heard.    No friction rub. No gallop.  Pulmonary:     Effort: Pulmonary effort is normal. No respiratory distress.     Breath sounds: Normal breath sounds. No stridor. No wheezing, rhonchi or rales.  Chest:     Chest wall: Tenderness present.       Comments: Breasts: breasts appear normal, no suspicious masses, no skin or nipple changes or axillary nodes, right breast normal without mass, skin or nipple changes or axillary nodes, left breast normal without mass, skin or nipple changes or axillary nodes, risk and benefit of breast self-exam was discussed  Abdominal:     General: Bowel sounds are normal.     Palpations: Abdomen is soft.  Musculoskeletal:        General: No swelling, tenderness, deformity or signs of injury. Normal range of motion.     Right lower leg: No edema.     Left lower leg: No edema.  Skin:    General: Skin is warm and dry.     Capillary Refill: Capillary refill takes less than 2 seconds.     Coloration: Skin is not  jaundiced or pale.     Findings: No bruising, erythema, lesion or rash.  Neurological:     General: No focal deficit present.     Mental Status: She is alert and oriented to person, place, and time. Mental status is at baseline.     Cranial Nerves: No cranial nerve deficit.     Sensory: No sensory deficit.     Motor: No weakness.     Coordination: Coordination normal.  Psychiatric:        Mood and Affect: Mood normal.        Behavior: Behavior normal.        Thought Content: Thought content normal.        Judgment: Judgment normal.     Results for orders placed or performed in visit on 10/06/22  POCT glycosylated hemoglobin (Hb A1C)  Result Value Ref Range   Hemoglobin A1C 6.8 (A) 4.0 - 5.6 %   HbA1c POC (<> result, manual entry)     HbA1c, POC (prediabetic range)     HbA1c, POC (controlled diabetic range)      Assessment & Plan     Problem List Items Addressed This Visit       Cardiovascular and Mediastinum   Hypertension associated with type 2 diabetes mellitus (HCC)    Chronic, borderline Goal of <130/<80 Encourage heart healthy diet and exercise to assist Remains on Norvasc 10 mg, Toprol 50 mg       Relevant Medications   amLODipine (NORVASC) 5 MG tablet     Respiratory   Bronchitis, chronic (HCC)    Chronic, stable Remains in research study Continues on theophylline 300 mg, singulair 10 mg, duo nebs PRN, trelegy Despite dx- pt continues to smoke        Endocrine   Diabetes mellitus type 2 with neurological  manifestations ([place])    Chronic, stable- continue metformin 1000 mg BID A1c at goal of <7% Continue to recommend balanced, lower carb meals. Smaller meal size, adding snacks. Choosing water as drink of choice and increasing purposeful exercise.       Relevant Orders   POCT glycosylated hemoglobin (Hb A1C) (Completed)     Other   Breast pain, left - Primary    Acute, self limiting Concern that this may be associated with underwire; do not believe  this is the cause given position of pain from 2-6 pm No palpable concerns; will send for [place] and Mammo per protocol       Relevant Orders   [place] LIMITED ULTRASOUND INCLUDING AXILLA LEFT BREAST    Colon cancer screening declined    Defers repeat colonoscopy at this time given her husband passed in 2/24 and patient is still settling his affairs       Depression, major, recurrent, moderate (HCC)    Chronic, remains stable Doing well since her husband's passing; however, some good/bad days which I have explained are normal. Remains on cymbalta 60 mg BID Denies SI or HI       Return in about 2 weeks (around 10/20/2022) for chonic disease management.     Vonna Kotyk, FNP, have reviewed all documentation for this visit. The documentation on 10/07/22 for the exam, diagnosis, procedures, and orders are all accurate and complete.  [name], [place] [phone] (phone) [phone] (fax)  [place]

## 2022-10-06 NOTE — Patient Instructions (Signed)
   [place] [facility]  [address], [place],  [place]  [address] Get Driving Directions Main: [phone]  Sunday:Closed Monday:7:20 AM - 5:00 PM Tuesday:7:20 AM - 5:00 PM Wednesday:7:20 AM - 5:00 PM Thursday:7:20 AM - 5:00 PM Friday:7:20 AM - 4:30 PM Saturday:Closed

## 2022-10-07 NOTE — Assessment & Plan Note (Signed)
Chronic, borderline Goal of <130/<80 Encourage heart healthy diet and exercise to assist Remains on Norvasc 10 mg, Toprol 50 mg

## 2022-10-07 NOTE — Assessment & Plan Note (Signed)
Defers repeat colonoscopy at this time given her husband passed in 2/24 and patient is still settling his affairs

## 2022-10-07 NOTE — Assessment & Plan Note (Signed)
Acute, self limiting Concern that this may be associated with underwire; do not believe this is the cause given position of pain from 2-6 pm No palpable concerns; will send for [place] and Mammo per protocol

## 2022-10-07 NOTE — Assessment & Plan Note (Signed)
Chronic, stable- continue metformin 1000 mg BID A1c at goal of <7% Continue to recommend balanced, lower carb meals. Smaller meal size, adding snacks. Choosing water as drink of choice and increasing purposeful exercise.

## 2022-10-07 NOTE — Telephone Encounter (Signed)
Requested Prescriptions  Pending Prescriptions Disp Refills   amLODipine (NORVASC) 5 MG tablet [Pharmacy Med Name: AMLODIPINE BESYLATE '5MG'$  TABLETS] 90 tablet     Sig: TAKE 1 TABLET(5 MG) BY MOUTH DAILY     Cardiovascular: Calcium Channel Blockers 2 Passed - 10/06/2022  4:26 PM      Passed - Last BP in normal range    BP Readings from Last 1 Encounters:  10/06/22 (!) 135/50         Passed - Last Heart Rate in normal range    Pulse Readings from Last 1 Encounters:  10/06/22 84         Passed - Valid encounter within last 6 months    Recent Outpatient Visits           Yesterday Hypertension associated with type 2 diabetes mellitus [facility])   [place] Tally Joe T, FNP   1 month ago Hypertension associated with type 2 diabetes mellitus [facility])   [place] [name], FNP   2 months ago Nausea   [place] Tally Joe T, FNP   2 months ago Diabetes mellitus type 2 with neurological manifestations [facility])   [place] [name], FNP   10 months ago Encounter for health maintenance examination   [facility] [name], PA-C       Future Appointments             In 1 week [name], [place], [facility]

## 2022-10-07 NOTE — Assessment & Plan Note (Signed)
Chronic, stable Remains in research study Continues on theophylline 300 mg, singulair 10 mg, duo nebs PRN, trelegy Despite dx- pt continues to smoke

## 2022-10-07 NOTE — Assessment & Plan Note (Signed)
Chronic, remains stable Doing well since her husband's passing; however, some good/bad days which I have explained are normal. Remains on cymbalta 60 mg BID Denies SI or HI

## 2022-10-14 ENCOUNTER — Ambulatory Visit
Admission: RE | Admit: 2022-10-14 | Discharge: 2022-10-14 | Disposition: A | Discharge status: Home / Self Care | Payer: Medicare PPO | Source: Ambulatory Visit | Attending: Family Medicine | Admitting: Family Medicine

## 2022-10-14 DIAGNOSIS — N644 Mastodynia: Secondary | ICD-10-CM | POA: Insufficient documentation

## 2022-10-17 NOTE — Progress Notes (Signed)
Hi [place]  Normal mammogram; repeat in 1 year.  Please let us know if you have any questions.  Thank you,  Rain [place], FNP 

## 2022-10-19 NOTE — Progress Notes (Unsigned)
Linda Bradford,acting as a scribe for Linda Sprout, FNP.,have documented all relevant documentation on the behalf of Linda Sprout, FNP,as directed by  Linda Sprout, FNP while in the presence of Linda Sprout, FNP.  Established patient visit  Patient: Linda Bradford   DOB: [birth year]   67 y.o. Female  MRN: [id number] Visit Date: 10/20/2022  Today's healthcare provider: [name], FNP  Introduced to nurse practitioner role and practice setting.  All questions answered.  Discussed provider/patient relationship and expectations.  Subjective    HPI  Hypertension, follow-up  BP Readings from Last 3 Encounters:  10/20/22 131/73  10/06/22 (!) 135/50  08/29/22 136/61   Wt Readings from Last 3 Encounters:  10/20/22 207 lb 8 oz (94.1 kg)  10/06/22 206 lb (93.4 kg)  08/29/22 205 lb 14.4 oz (93.4 kg)     She was last seen for hypertension 5 weeks ago.  BP at that visit was 135/50. Management since that visit includes Recommend start of norvasc with slow titration from 5 mg to 10 mg as needed .  She reports good compliance with treatment. She is having side effects. Patient complains of swelling/"puffiness" in both feet has since resolved. She does smoke.  Outside blood pressures are not being checked Symptoms: No chest pain No chest pressure  No palpitations No syncope  No dyspnea No orthopnea  No paroxysmal nocturnal dyspnea Yes lower extremity edema   Pertinent labs Lab Results  Component Value Date   CHOL 169 12/07/2021   HDL 60 12/07/2021   LDLCALC 83 12/07/2021   TRIG 149 12/07/2021   CHOLHDL 2.8 12/07/2021   Lab Results  Component Value Date   NA 139 07/12/2022   K 4.9 07/12/2022   CREATININE 0.53 (L) 07/12/2022   EGFR 102 07/12/2022   GLUCOSE 117 (H) 07/12/2022   TSH 2.384 06/29/2022     The 10-year ASCVD risk score (Arnett DK, et al., 2019) is: 24.7%  Patient complains of slight pain in the left ankle, says "it feels like I am pulling a tendon" when  rotated in a particular way.  ---------------------------------------------------------------------------------------------------   Medications: Outpatient Medications Prior to Visit  Medication Sig Note   acetaminophen (TYLENOL) 500 MG tablet Take 1,000 mg by mouth every 6 (six) hours as needed for mild pain or moderate pain.     amLODipine (NORVASC) 5 MG tablet Take 1 tablet (5 mg total) by mouth daily.    aspirin 81 MG chewable tablet Chew 1 tablet (81 mg total) by mouth 2 (two) times daily.    benzonatate (TESSALON) 200 MG capsule TAKE 1 CAPSULE BY MOUTH 3 TIMES DAILY ASNEEDED    blood glucose meter kit and supplies Dispense based on patient and insurance preference. Once daily. (FOR ICD-10 E10.9, E11.9).    buPROPion (WELLBUTRIN XL) 300 MG 24 hr tablet Take 1 tablet (300 mg total) by mouth daily.    Cholecalciferol (VITAMIN D) 50 MCG (2000 UT) tablet Take 2,000 Units by mouth daily.    diclofenac (VOLTAREN) 50 MG EC tablet     diclofenac Sodium (VOLTAREN) 1 % GEL     DULoxetine (CYMBALTA) 60 MG capsule Take 1 capsule (60 mg total) by mouth 2 (two) times daily.    fluticasone (FLONASE) 50 MCG/ACT nasal spray Place 2 sprays into both nostrils daily.    Fluticasone-Umeclidin-Vilant (TRELEGY ELLIPTA) 100-62.5-25 MCG/ACT AEPB Inhale 1 puff into the lungs daily.    glucose blood (CONTOUR NEXT TEST) test strip To check  blood sugar once daily    ipratropium-albuterol (DUONEB) 0.5-2.5 (3) MG/3ML SOLN Take 3 mLs by nebulization every 6 (six) hours as needed.    levothyroxine (SYNTHROID) 75 MCG tablet Take 1 tablet (75 mcg total) by mouth daily before breakfast.    loperamide (IMODIUM A-D) 2 MG tablet Take 2-4 mg by mouth 4 (four) times daily as needed for diarrhea or loose stools.    meloxicam (MOBIC) 15 MG tablet Take 1 tablet (15 mg total) by mouth daily.    metFORMIN (GLUCOPHAGE-XR) 500 MG 24 hr tablet Take 2 tablets (1,000 mg total) by mouth 2 (two) times daily with a meal.    metoCLOPramide  (REGLAN) 10 MG tablet Take 1 tablet (10 mg total) by mouth every 8 (eight) hours as needed for nausea.    metoprolol succinate (TOPROL-XL) 50 MG 24 hr tablet TAKE 1 TABLET BY MOUTH ONCE DAILY WITH OR IMMEDIATELY FOLLOWING A MEAL    montelukast (SINGULAIR) 10 MG tablet Take 1 tablet (10 mg total) by mouth at bedtime.    nystatin (MYCOSTATIN) 100000 UNIT/ML suspension Take 5 mLs (500,000 Units total) by mouth 4 (four) times daily.    nystatin (MYCOSTATIN/NYSTOP) powder Apply 1 application topically 3 (three) times daily. 10/20/2022: Rx refill    oxybutynin (DITROPAN-XL) 5 MG 24 hr tablet Take 1 tablet (5 mg total) by mouth at bedtime.    pantoprazole (PROTONIX) 20 MG tablet TAKE 1 TABLET(20 MG) BY MOUTH TWICE DAILY BEFORE A MEAL 10/20/2022: Rx refill    primidone (MYSOLINE) 50 MG tablet Take 0.5 tablets (25 mg total) by mouth 2 (two) times daily.    rosuvastatin (CRESTOR) 10 MG tablet Take 1 tablet (10 mg total) by mouth daily.    sodium chloride 1 g tablet Take 1 tablet (1 g total) by mouth 2 (two) times daily with a meal.    theophylline (THEODUR) 300 MG 12 hr tablet TAKE 1 TABLET BY MOUTH DAILY    [DISCONTINUED] gabapentin (NEURONTIN) 300 MG capsule Take 1 capsule (300 mg total) by mouth at bedtime.    No facility-administered medications prior to visit.    Review of Systems  Neurological:  Negative for headaches.      Objective    BP 131/73 (BP Location: Right Arm, Patient Position: Sitting, Cuff Size: Normal)   Pulse 64   Temp 98.1 F (36.7 C) (Oral)   Resp 11   Ht 5' 7.52" (1.715 m)   Wt 207 lb 8 oz (94.1 kg)   SpO2 96%   BMI 32.00 kg/m   Physical Exam Vitals and nursing note reviewed.  Constitutional:      General: She is not in acute distress.    Appearance: Normal appearance. She is overweight. She is not ill-appearing, toxic-appearing or diaphoretic.  HENT:     Head: Normocephalic and atraumatic.  Cardiovascular:     Rate and Rhythm: Normal rate and regular rhythm.      Pulses: Normal pulses.     Heart sounds: Normal heart sounds. No murmur heard.    No friction rub. No gallop.  Pulmonary:     Effort: Pulmonary effort is normal. No respiratory distress.     Breath sounds: No stridor. Rhonchi present. No wheezing or rales.  Chest:     Chest wall: No tenderness.  Musculoskeletal:        General: No swelling, tenderness, deformity or signs of injury. Normal range of motion.     Right lower leg: No edema.     Left lower  leg: No edema.  Skin:    General: Skin is warm and dry.     Capillary Refill: Capillary refill takes less than 2 seconds.     Coloration: Skin is not jaundiced or pale.     Findings: No bruising, erythema, lesion or rash.  Neurological:     General: No focal deficit present.     Mental Status: She is alert and oriented to person, place, and time. Mental status is at baseline.     Cranial Nerves: No cranial nerve deficit.     Sensory: No sensory deficit.     Motor: No weakness.     Coordination: Coordination normal.  Psychiatric:        Mood and Affect: Mood normal.        Behavior: Behavior normal.        Thought Content: Thought content normal.        Judgment: Judgment normal.     No results found for any visits on 10/20/22.  Assessment & Plan     Problem List Items Addressed This Visit       Cardiovascular and Mediastinum   Hypertension associated with type 2 diabetes mellitus ([place]) - Primary    Chronic, improved Goal at <130/<80 Continue lifestyle to assist Endorses stress and anxiety following         Endocrine   Diabetes mellitus type 2 with neurological manifestations (HCC)    Chronic, stable Continue to recommend balanced, lower carb meals. Smaller meal size, adding snacks. Choosing water as drink of choice and increasing purposeful exercise. Reports binging on cookies at night during couch time        Relevant Medications   gabapentin (NEURONTIN) 300 MG capsule     Other   Binge eating    Acute on  chronic, variable Encouraged to make smarting choices; defer buying sweets; bring smaller sweets into the home      Return if symptoms worsen or fail to improve.     Vonna Kotyk, FNP, have reviewed all documentation for this visit. The documentation on 10/20/22 for the exam, diagnosis, procedures, and orders are all accurate and complete.  Linda Bradford, [place] [phone] (phone) [phone] (fax)  [place]

## 2022-10-20 ENCOUNTER — Ambulatory Visit: Payer: Medicare PPO | Admitting: Family Medicine

## 2022-10-20 ENCOUNTER — Encounter: Payer: Self-pay | Admitting: Family Medicine

## 2022-10-20 ENCOUNTER — Other Ambulatory Visit: Payer: Self-pay

## 2022-10-20 VITALS — BP 131/73 | HR 64 | Temp 98.1°F | Resp 11 | Ht 67.52 in | Wt 207.5 lb

## 2022-10-20 DIAGNOSIS — E1149 Type 2 diabetes mellitus with other diabetic neurological complication: Secondary | ICD-10-CM | POA: Diagnosis not present

## 2022-10-20 DIAGNOSIS — I152 Hypertension secondary to endocrine disorders: Secondary | ICD-10-CM | POA: Diagnosis not present

## 2022-10-20 DIAGNOSIS — Z7951 Long term (current) use of inhaled steroids: Secondary | ICD-10-CM

## 2022-10-20 DIAGNOSIS — E1159 Type 2 diabetes mellitus with other circulatory complications: Secondary | ICD-10-CM | POA: Diagnosis not present

## 2022-10-20 DIAGNOSIS — R632 Polyphagia: Secondary | ICD-10-CM | POA: Insufficient documentation

## 2022-10-20 DIAGNOSIS — B370 Candidal stomatitis: Secondary | ICD-10-CM

## 2022-10-20 MED ORDER — NYSTATIN 100000 UNIT/ML MT SUSP: 5.0000 mL | Freq: Four times a day (QID) | OROMUCOSAL | 0 refills | Status: DC

## 2022-10-20 MED ORDER — GABAPENTIN 300 MG PO CAPS: ORAL_CAPSULE | ORAL | 3 refills | Status: DC

## 2022-10-20 MED ORDER — PANTOPRAZOLE SODIUM 20 MG PO TBEC: DELAYED_RELEASE_TABLET | ORAL | 0 refills | Status: DC

## 2022-10-20 MED ORDER — THEOPHYLLINE ER 300 MG PO TB12: 300.0000 mg | ORAL_TABLET | Freq: Every day | ORAL | 0 refills | Status: DC

## 2022-10-20 NOTE — Patient Instructions (Signed)
The CDC recommends two doses of Shingrix (the new shingles vaccine) separated by 2 to 6 months for adults age 67 years and older. I recommend checking with your insurance plan regarding coverage for this vaccine.    

## 2022-10-20 NOTE — Assessment & Plan Note (Signed)
Acute on chronic, variable Encouraged to make smarting choices; defer buying sweets; bring smaller sweets into the home

## 2022-10-20 NOTE — Assessment & Plan Note (Signed)
Chronic, improved Goal at <130/<80 Continue lifestyle to assist Endorses stress and anxiety following

## 2022-10-20 NOTE — Assessment & Plan Note (Signed)
Chronic, stable Continue to recommend balanced, lower carb meals. Smaller meal size, adding snacks. Choosing water as drink of choice and increasing purposeful exercise. Reports binging on cookies at night during couch time

## 2022-10-28 ENCOUNTER — Ambulatory Visit: Payer: Medicare PPO | Admitting: Physician Assistant

## 2022-10-28 ENCOUNTER — Other Ambulatory Visit: Payer: Self-pay

## 2022-10-28 ENCOUNTER — Emergency Department
Admission: EM | Admit: 2022-10-28 | Discharge: 2022-10-28 | Disposition: A | Discharge status: Home / Self Care | Payer: Medicare PPO | Attending: Emergency Medicine | Admitting: Emergency Medicine

## 2022-10-28 ENCOUNTER — Emergency Department: Payer: Medicare PPO

## 2022-10-28 ENCOUNTER — Ambulatory Visit: Payer: Self-pay | Admitting: *Deleted

## 2022-10-28 DIAGNOSIS — R4182 Altered mental status, unspecified: Secondary | ICD-10-CM | POA: Diagnosis not present

## 2022-10-28 DIAGNOSIS — N390 Urinary tract infection, site not specified: Secondary | ICD-10-CM | POA: Diagnosis not present

## 2022-10-28 DIAGNOSIS — R4781 Slurred speech: Secondary | ICD-10-CM | POA: Insufficient documentation

## 2022-10-28 DIAGNOSIS — I6782 Cerebral ischemia: Secondary | ICD-10-CM | POA: Diagnosis not present

## 2022-10-28 DIAGNOSIS — R42 Dizziness and giddiness: Secondary | ICD-10-CM | POA: Diagnosis present

## 2022-10-28 LAB — DIFFERENTIAL
Abs Immature Granulocytes: 0.02 10*3/uL (ref 0.00–0.07)
Basophils Absolute: 0.1 10*3/uL (ref 0.0–0.1)
Basophils Relative: 1 %
Eosinophils Absolute: 0.2 10*3/uL (ref 0.0–0.5)
Eosinophils Relative: 3 %
Immature Granulocytes: 0 %
Lymphocytes Relative: 22 %
Lymphs Abs: 1.5 10*3/uL (ref 0.7–4.0)
Monocytes Absolute: 0.6 10*3/uL (ref 0.1–1.0)
Monocytes Relative: 8 %
Neutro Abs: 4.5 10*3/uL (ref 1.7–7.7)
Neutrophils Relative %: 66 %

## 2022-10-28 LAB — CBC
HCT: 45.7 % (ref 36.0–46.0)
Hemoglobin: 14.7 g/dL (ref 12.0–15.0)
MCH: 29.8 pg (ref 26.0–34.0)
MCHC: 32.2 g/dL (ref 30.0–36.0)
MCV: 92.7 fL (ref 80.0–100.0)
Platelets: 250 10*3/uL (ref 150–400)
RBC: 4.93 MIL/uL (ref 3.87–5.11)
RDW: 13.2 % (ref 11.5–15.5)
WBC: 6.8 10*3/uL (ref 4.0–10.5)
nRBC: 0 % (ref 0.0–0.2)

## 2022-10-28 LAB — URINALYSIS, ROUTINE W REFLEX MICROSCOPIC
Bilirubin Urine: NEGATIVE
Glucose, UA: NEGATIVE mg/dL
Hgb urine dipstick: NEGATIVE
Ketones, ur: NEGATIVE mg/dL
Nitrite: NEGATIVE
Protein, ur: NEGATIVE mg/dL
Specific Gravity, Urine: 1.015 (ref 1.005–1.030)
WBC, UA: >50 WBC/hpf (ref 0–5)
pH: 5 (ref 5.0–8.0)

## 2022-10-28 LAB — COMPREHENSIVE METABOLIC PANEL
ALT: 15 U/L (ref 0–44)
AST: 17 U/L (ref 15–41)
Albumin: 4 g/dL (ref 3.5–5.0)
Alkaline Phosphatase: 92 U/L (ref 38–126)
Anion gap: 11 (ref 5–15)
BUN: 14 mg/dL (ref 8–23)
CO2: 26 mmol/L (ref 22–32)
Calcium: 9.5 mg/dL (ref 8.9–10.3)
Chloride: 100 mmol/L (ref 98–111)
Creatinine, Ser: 0.61 mg/dL (ref 0.44–1.00)
GFR, Estimated: >60 mL/min (ref >60)
Glucose, Bld: 139 mg/dL — ABNORMAL HIGH (ref 70–99)
Potassium: 4.2 mmol/L (ref 3.5–5.1)
Sodium: 137 mmol/L (ref 135–145)
Total Bilirubin: 0.6 mg/dL (ref 0.3–1.2)
Total Protein: 7.6 g/dL (ref 6.5–8.1)

## 2022-10-28 LAB — PROTIME-INR
INR: 1.1 (ref 0.8–1.2)
Prothrombin Time: 13.6 seconds (ref 11.4–15.2)

## 2022-10-28 LAB — APTT: aPTT: 28 seconds (ref 24–36)

## 2022-10-28 LAB — ETHANOL: Alcohol, Ethyl (B): <10 mg/dL (ref <10)

## 2022-10-28 MED ORDER — SODIUM CHLORIDE 0.9 % IV BOLUS
1000.0000 mL | Freq: Once | INTRAVENOUS | Status: AC
  Administered 2022-10-28: 1000 mL via INTRAVENOUS

## 2022-10-28 MED ORDER — GADOBUTROL 1 MMOL/ML IV SOLN
9.0000 mL | Freq: Once | INTRAVENOUS | Status: AC | PRN
  Administered 2022-10-28: 10 mL via INTRAVENOUS

## 2022-10-28 MED ORDER — CEFDINIR 300 MG PO CAPS: 300.0000 mg | ORAL_CAPSULE | Freq: Two times a day (BID) | ORAL | 0 refills | Status: AC

## 2022-10-28 MED ORDER — SODIUM CHLORIDE 0.9 % IV SOLN
1.0000 g | Freq: Once | INTRAVENOUS | Status: AC
  Administered 2022-10-28: 1 g via INTRAVENOUS
  Filled 2022-10-28: qty 10

## 2022-10-28 MED ORDER — SODIUM CHLORIDE 0.9% FLUSH: 3.0000 mL | Freq: Once | INTRAVENOUS | Status: DC

## 2022-10-28 NOTE — ED Notes (Signed)
Pt transported to CT ?

## 2022-10-28 NOTE — ED Notes (Signed)
Patient transported to MRI 

## 2022-10-28 NOTE — Telephone Encounter (Signed)
Noted  

## 2022-10-28 NOTE — Telephone Encounter (Signed)
  Chief Complaint: unsteady on feet, trouble keeping thoughts Symptoms: see above Frequency: started yesterday   Disposition: [x] ED /[] Urgent Care (no appt availability in office) / [] Appointment(In office/virtual)/ []  [facility] [facility]/ [] Home Care/ [] Refused Recommended Disposition /[] [place] [facility]/ []  Follow-up with PCP Additional Notes: Patient was having trouble expressing thoughts during triage- could not remember - advised ED now- her son will take her

## 2022-10-28 NOTE — Progress Notes (Deleted)
Established patient visit   Patient: Linda Bradford   DOB: 09-Jan-1956   67 y.o. Female  MRN: [id number] Visit Date: 10/28/2022  Today's healthcare provider: Mardene Speak, PA-C   No chief complaint on file.  Subjective    HPI  ***  Medications: Outpatient Medications Prior to Visit  Medication Sig   acetaminophen (TYLENOL) 500 MG tablet Take 1,000 mg by mouth every 6 (six) hours as needed for mild pain or moderate pain.    amLODipine (NORVASC) 5 MG tablet Take 1 tablet (5 mg total) by mouth daily.   aspirin 81 MG chewable tablet Chew 1 tablet (81 mg total) by mouth 2 (two) times daily.   benzonatate (TESSALON) 200 MG capsule TAKE 1 CAPSULE BY MOUTH 3 TIMES DAILY ASNEEDED   blood glucose meter kit and supplies Dispense based on patient and insurance preference. Once daily. (FOR ICD-10 E10.9, E11.9).   buPROPion (WELLBUTRIN XL) 300 MG 24 hr tablet Take 1 tablet (300 mg total) by mouth daily.   Cholecalciferol (VITAMIN D) 50 MCG (2000 UT) tablet Take 2,000 Units by mouth daily.   diclofenac (VOLTAREN) 50 MG EC tablet    diclofenac Sodium (VOLTAREN) 1 % GEL    DULoxetine (CYMBALTA) 60 MG capsule Take 1 capsule (60 mg total) by mouth 2 (two) times daily.   fluticasone (FLONASE) 50 MCG/ACT nasal spray Place 2 sprays into both nostrils daily.   Fluticasone-Umeclidin-Vilant (TRELEGY ELLIPTA) 100-62.5-25 MCG/ACT AEPB Inhale 1 puff into the lungs daily.   gabapentin (NEURONTIN) 300 MG capsule Take 2 capsules PO every morning; Take 3 capsules PO every evening.   glucose blood (CONTOUR NEXT TEST) test strip To check blood sugar once daily   ipratropium-albuterol (DUONEB) 0.5-2.5 (3) MG/3ML SOLN Take 3 mLs by nebulization every 6 (six) hours as needed.   levothyroxine (SYNTHROID) 75 MCG tablet Take 1 tablet (75 mcg total) by mouth daily before breakfast.   loperamide (IMODIUM A-D) 2 MG tablet Take 2-4 mg by mouth 4 (four) times daily as needed for diarrhea or loose stools.   meloxicam  (MOBIC) 15 MG tablet Take 1 tablet (15 mg total) by mouth daily.   metFORMIN (GLUCOPHAGE-XR) 500 MG 24 hr tablet Take 2 tablets (1,000 mg total) by mouth 2 (two) times daily with a meal.   metoCLOPramide (REGLAN) 10 MG tablet Take 1 tablet (10 mg total) by mouth every 8 (eight) hours as needed for nausea.   metoprolol succinate (TOPROL-XL) 50 MG 24 hr tablet TAKE 1 TABLET BY MOUTH ONCE DAILY WITH OR IMMEDIATELY FOLLOWING A MEAL   montelukast (SINGULAIR) 10 MG tablet Take 1 tablet (10 mg total) by mouth at bedtime.   nystatin (MYCOSTATIN) 100000 UNIT/ML suspension Take 5 mLs (500,000 Units total) by mouth 4 (four) times daily.   nystatin (MYCOSTATIN/NYSTOP) powder Apply 1 application topically 3 (three) times daily.   oxybutynin (DITROPAN-XL) 5 MG 24 hr tablet Take 1 tablet (5 mg total) by mouth at bedtime.   pantoprazole (PROTONIX) 20 MG tablet TAKE 1 TABLET(20 MG) BY MOUTH TWICE DAILY BEFORE A MEAL   primidone (MYSOLINE) 50 MG tablet Take 0.5 tablets (25 mg total) by mouth 2 (two) times daily.   rosuvastatin (CRESTOR) 10 MG tablet Take 1 tablet (10 mg total) by mouth daily.   sodium chloride 1 g tablet Take 1 tablet (1 g total) by mouth 2 (two) times daily with a meal.   theophylline (THEODUR) 300 MG 12 hr tablet Take 1 tablet (300 mg total) by mouth  daily.   No facility-administered medications prior to visit.    Review of Systems  {Labs  Heme  Chem  Endocrine  Serology  Results Review (optional):23779}   Objective    There were no vitals taken for this visit. {Show previous vital signs (optional):23777}  Physical Exam  ***  No results found for any visits on 10/28/22.  Assessment & Plan     ***  No follow-ups on file.      {provider attestation***:1}   Mardene Speak, PA-C  [place] [phone] (phone) [phone] (fax)  [place]

## 2022-10-28 NOTE — ED Notes (Signed)
Pt verbalized understanding of discharge instructions. Opportunity for questions provided.  

## 2022-10-28 NOTE — ED Triage Notes (Signed)
Pt to ED via POV from home. Pt reports yesterday morning she started having slurred speech, dizziness and unsteady gait. Son states pt has tremors at baseline but they have been more intense. Son also reports increased life stressors. Pt denies hx of stroke. Pt denies blood thinners. Pt BP 90/59 in triage and states she did take her BP meds this morning.

## 2022-10-28 NOTE — ED Provider Notes (Signed)
[facility] Provider Note   Event Date/Time   First MD Initiated Contact with Patient 10/28/22 1050     (approximate) History  Dizziness  HPI Linda Bradford is a 67 y.o. female presents for lightheadedness and generalized weakness over the past 24 hours.  Patient states that she has been unable to ambulate occasionally due to weakness in her legs and also states that she has been having difficulty with word finding over this time as well.  Patient denies any abdominal pain, patient denies any lower extremity pain, patient denies any recent travel or sick contacts. ROS: Patient currently denies any vision changes, tinnitus, difficulty speaking, facial droop, sore throat, chest pain, shortness of breath, abdominal pain, nausea/vomiting/diarrhea, dysuria, or weakness/numbness/paresthesias in any extremity   Physical Exam  Triage Vital Signs: ED Triage Vitals  Enc Vitals Group     BP 10/28/22 1047 (!) 90/59     Pulse Rate 10/28/22 1047 71     Resp 10/28/22 1047 16     Temp 10/28/22 1044 98 F (36.7 C)     Temp Source 10/28/22 1044 Oral     SpO2 10/28/22 1047 96 %     Weight 10/28/22 1044 207 lb 8 oz (94.1 kg)     Height 10/28/22 1044 5\' 7"  (1.702 m)     Head Circumference --      Peak Flow --      Pain Score 10/28/22 1044 0     Pain Loc --      Pain Edu? --      Excl. in [place]? --    Most recent vital signs: Vitals:   10/28/22 1445 10/28/22 1451  BP: (!) 141/64   Pulse: 69   Resp: 19   Temp:  97.7 F (36.5 C)  SpO2: 94%    General: Awake, oriented x4. CV:  Good peripheral perfusion.  Resp:  Normal effort.  Abd:  No distention.  Other:  Elderly obese Caucasian female laying in bed in no acute distress ED Results / Procedures / Treatments  Labs (all labs ordered are listed, but only abnormal results are displayed) Labs Reviewed  COMPREHENSIVE METABOLIC PANEL - Abnormal; Notable for the following components:      Result Value   Glucose, Bld 139 (*)     All other components within normal limits  URINALYSIS, ROUTINE W REFLEX MICROSCOPIC - Abnormal; Notable for the following components:   Color, Urine YELLOW (*)    APPearance CLOUDY (*)    Leukocytes,Ua LARGE (*)    Bacteria, UA FEW (*)    All other components within normal limits  PROTIME-INR  APTT  CBC  DIFFERENTIAL  ETHANOL   EKG ED ECG REPORT I, Naaman Plummer, the attending physician, personally viewed and interpreted this ECG. Date: 10/28/2022 EKG Time: 1048 Rate: 67 Rhythm: normal sinus rhythm QRS Axis: normal Intervals: normal ST/T Wave abnormalities: normal Narrative Interpretation: no evidence of acute ischemia RADIOLOGY ED MD interpretation:   MRI of the brain with contrast shows no evidence of acute abnormalities  CT of the head without contrast interpreted by me shows no evidence of acute abnormalities including no intracerebral hemorrhage, obvious masses, or significant edema -Agree with radiology assessment Official radiology report(s): MR Brain W and Wo Contrast  Result Date: 10/28/2022 CLINICAL DATA:  Slurred speech, dizziness, and unsteady gait beginning yesterday. EXAM: MRI HEAD WITHOUT AND WITH CONTRAST TECHNIQUE: Multiplanar, multiecho pulse sequences of the brain and surrounding structures were obtained without and with intravenous  contrast. CONTRAST:  34mL GADAVIST GADOBUTROL 1 MMOL/ML IV SOLN COMPARISON:  CT head obtained earlier the same day. Brain MRI 06/29/2022 FINDINGS: Brain: There is no acute intracranial hemorrhage, extra-axial fluid collection, or acute infarct. Parenchymal volume is normal. The ventricles are normal in size. Patchy FLAIR signal abnormality in the supratentorial white matter and pons is unchanged, likely reflecting sequela of chronic small-vessel ischemic change. There is no acute or suspicious parenchymal signal abnormality. The pituitary and suprasellar region are normal. There is no mass lesion or abnormal enhancement. There is no  mass effect or midline shift. Vascular: Normal flow voids. Skull and upper cervical spine: Normal marrow signal. Sinuses/Orbits: The paranasal sinuses are clear. The globes and orbits are unremarkable. Other: None. IMPRESSION: No acute intracranial pathology. Electronically Signed   By: Valetta Mole M.D.   On: 10/28/2022 13:57   CT HEAD WO CONTRAST  Result Date: 10/28/2022 CLINICAL DATA:  TIA EXAM: CT HEAD WITHOUT CONTRAST TECHNIQUE: Contiguous axial images were obtained from the base of the skull through the vertex without intravenous contrast. RADIATION DOSE REDUCTION: This exam was performed according to the departmental dose-optimization program which includes automated exposure control, adjustment of the mA and/or kV according to patient size and/or use of iterative reconstruction technique. COMPARISON:  CT head 06/28/22 FINDINGS: Brain: No evidence of acute infarction, hemorrhage, hydrocephalus, extra-axial collection or mass lesion/mass effect. Unchanged small calcification in the left hemi pons. Sequela of mild chronic microvascular ischemic change. Vascular: No hyperdense vessel or unexpected calcification. Skull: Normal. Negative for fracture or focal lesion. Sinuses/Orbits: No middle ear or mastoid effusion. Paranasal sinuses are clear. Orbits are unremarkable. Other: None. IMPRESSION: No acute intracranial abnormality. Electronically Signed   By: Marin Roberts M.D.   On: 10/28/2022 11:35   PROCEDURES: Critical Care performed: No .1-3 Lead EKG Interpretation  Performed by: Naaman Plummer, MD Authorized by: Naaman Plummer, MD     Interpretation: normal     ECG rate:  71   ECG rate assessment: normal     Rhythm: sinus rhythm     Ectopy: none     Conduction: normal    MEDICATIONS ORDERED IN ED: Medications  sodium chloride flush (NS) 0.9 % injection 3 mL ( Intravenous Canceled Entry 10/28/22 1047)  sodium chloride 0.9 % bolus 1,000 mL (0 mLs Intravenous Stopped 10/28/22 1411)   gadobutrol (GADAVIST) 1 MMOL/ML injection 9 mL (10 mLs Intravenous Contrast Given 10/28/22 1345)  cefTRIAXone (ROCEPHIN) 1 g in sodium chloride 0.9 % 100 mL IVPB (0 g Intravenous Stopped 10/28/22 1452)   IMPRESSION / MDM / ASSESSMENT AND PLAN / ED COURSE  I reviewed the triage vital signs and the nursing notes.                             The patient is on the cardiac monitor to evaluate for evidence of arrhythmia and/or significant heart rate changes. Patient's presentation is most consistent with acute presentation with potential threat to life or bodily function. Patient has signs and symptoms of urinary tract infection that is likely the cause of patient's symptoms. Not Pregnant. Unlikely TOA, Ovarian Torsion, PID, gonorrhea/chlamydia. Low suspicion for Infected Urolithiasis, AAA, Cholecystitis, Pancreatitis, SBO, Appendicitis, or other acute abdomen.  Rx: Cefdinir 300 mg BID for 5 days Disposition: Discharge home. SRP discussed. Advise follow up with primary care provider within 24-72 hours.   FINAL CLINICAL IMPRESSION(S) / ED DIAGNOSES   Final diagnoses:  Lightheadedness  Lower urinary tract infectious disease   Rx / DC Orders   ED Discharge Orders          Ordered    cefdinir (OMNICEF) 300 MG capsule  2 times daily        10/28/22 1409           Note:  This document was prepared using Dragon voice recognition software and may include unintentional dictation errors.   Naaman Plummer, MD 10/28/22 [phone]

## 2022-10-28 NOTE — Telephone Encounter (Signed)
Reason for Disposition  SEVERE dizziness (e.g., unable to stand, requires support to walk, feels like passing out now)    Patient is having unsteadiness and trouble gathering thoughts  Answer Assessment - Initial Assessment Questions 1. DESCRIPTION: "Describe your dizziness."     Unsteady on feet- has to hold on 2. LIGHTHEADED: "Do you feel lightheaded?" (e.g., somewhat faint, woozy, weak upon standing)     Problems remembering things, unsteady   3. VERTIGO: "Do you feel like either you or the room is spinning or tilting?" (i.e. vertigo)     no 4. SEVERITY: "How bad is it?"  "Do you feel like you are going to faint?" "Can you stand and walk?"   - MILD: Feels slightly dizzy, but walking normally.   - MODERATE: Feels unsteady when walking, but not falling; interferes with normal activities (e.g., school, work).   - SEVERE: Unable to walk without falling, or requires assistance to walk without falling; feels like passing out now.      Moderate to severe 5. ONSET:  "When did the dizziness begin?"     yesterday  8. CAUSE: "What do you think is causing the dizziness?"     unsure 9. RECURRENT SYMPTOM: "Have you had dizziness before?" If Yes, ask: "When was the last time?" "What happened that time?"     Patient states she has had this before- she was hospitalized  10. OTHER SYMPTOMS: "Do you have any other symptoms?" (e.g., fever, chest pain, vomiting, diarrhea, bleeding)       Trouble maintaining thoughts  Protocols used: Dizziness - Lightheadedness-A-AH

## 2022-10-31 ENCOUNTER — Ambulatory Visit (INDEPENDENT_AMBULATORY_CARE_PROVIDER_SITE_OTHER): Payer: Medicare PPO | Admitting: Family Medicine

## 2022-10-31 ENCOUNTER — Emergency Department
Admission: EM | Admit: 2022-10-31 | Discharge: 2022-10-31 | Disposition: A | Discharge status: Home / Self Care | Payer: Medicare PPO | Attending: Emergency Medicine | Admitting: Emergency Medicine

## 2022-10-31 ENCOUNTER — Other Ambulatory Visit: Payer: Self-pay

## 2022-10-31 ENCOUNTER — Emergency Department: Payer: Medicare PPO

## 2022-10-31 DIAGNOSIS — E119 Type 2 diabetes mellitus without complications: Secondary | ICD-10-CM | POA: Diagnosis not present

## 2022-10-31 DIAGNOSIS — I10 Essential (primary) hypertension: Secondary | ICD-10-CM | POA: Insufficient documentation

## 2022-10-31 DIAGNOSIS — R531 Weakness: Secondary | ICD-10-CM | POA: Insufficient documentation

## 2022-10-31 DIAGNOSIS — R443 Hallucinations, unspecified: Secondary | ICD-10-CM | POA: Diagnosis not present

## 2022-10-31 DIAGNOSIS — R4182 Altered mental status, unspecified: Secondary | ICD-10-CM | POA: Diagnosis present

## 2022-10-31 LAB — CBC WITH DIFFERENTIAL/PLATELET
Abs Immature Granulocytes: 0.02 10*3/uL (ref 0.00–0.07)
Basophils Absolute: 0.1 10*3/uL (ref 0.0–0.1)
Basophils Relative: 1 %
Eosinophils Absolute: 0.3 10*3/uL (ref 0.0–0.5)
Eosinophils Relative: 5 %
HCT: 45.1 % (ref 36.0–46.0)
Hemoglobin: 14.8 g/dL (ref 12.0–15.0)
Immature Granulocytes: 0 %
Lymphocytes Relative: 26 %
Lymphs Abs: 1.6 10*3/uL (ref 0.7–4.0)
MCH: 30 pg (ref 26.0–34.0)
MCHC: 32.8 g/dL (ref 30.0–36.0)
MCV: 91.5 fL (ref 80.0–100.0)
Monocytes Absolute: 0.5 10*3/uL (ref 0.1–1.0)
Monocytes Relative: 9 %
Neutro Abs: 3.5 10*3/uL (ref 1.7–7.7)
Neutrophils Relative %: 59 %
Platelets: 234 10*3/uL (ref 150–400)
RBC: 4.93 MIL/uL (ref 3.87–5.11)
RDW: 13.4 % (ref 11.5–15.5)
WBC: 5.9 10*3/uL (ref 4.0–10.5)
nRBC: 0 % (ref 0.0–0.2)

## 2022-10-31 LAB — URINE DRUG SCREEN, QUALITATIVE (ARMC ONLY)
Amphetamines, Ur Screen: NOT DETECTED
Barbiturates, Ur Screen: POSITIVE — AB
Benzodiazepine, Ur Scrn: NOT DETECTED
Cannabinoid 50 Ng, Ur ~~LOC~~: NOT DETECTED
Cocaine Metabolite,Ur ~~LOC~~: NOT DETECTED
MDMA (Ecstasy)Ur Screen: NOT DETECTED
Methadone Scn, Ur: NOT DETECTED
Opiate, Ur Screen: NOT DETECTED
Phencyclidine (PCP) Ur S: NOT DETECTED
Tricyclic, Ur Screen: NOT DETECTED

## 2022-10-31 LAB — URINALYSIS, ROUTINE W REFLEX MICROSCOPIC
Bilirubin Urine: NEGATIVE
Glucose, UA: NEGATIVE mg/dL
Hgb urine dipstick: NEGATIVE
Ketones, ur: NEGATIVE mg/dL
Leukocytes,Ua: NEGATIVE
Nitrite: NEGATIVE
Protein, ur: NEGATIVE mg/dL
Specific Gravity, Urine: 1.012 (ref 1.005–1.030)
pH: 6 (ref 5.0–8.0)

## 2022-10-31 LAB — COMPREHENSIVE METABOLIC PANEL
ALT: 17 U/L (ref 0–44)
AST: 20 U/L (ref 15–41)
Albumin: 4.2 g/dL (ref 3.5–5.0)
Alkaline Phosphatase: 83 U/L (ref 38–126)
Anion gap: 10 (ref 5–15)
BUN: 14 mg/dL (ref 8–23)
CO2: 26 mmol/L (ref 22–32)
Calcium: 9.5 mg/dL (ref 8.9–10.3)
Chloride: 100 mmol/L (ref 98–111)
Creatinine, Ser: 0.56 mg/dL (ref 0.44–1.00)
GFR, Estimated: >60 mL/min (ref >60)
Glucose, Bld: 146 mg/dL — ABNORMAL HIGH (ref 70–99)
Potassium: 4.5 mmol/L (ref 3.5–5.1)
Sodium: 136 mmol/L (ref 135–145)
Total Bilirubin: 0.6 mg/dL (ref 0.3–1.2)
Total Protein: 7.4 g/dL (ref 6.5–8.1)

## 2022-10-31 LAB — CBG MONITORING, ED: Glucose-Capillary: 150 mg/dL — ABNORMAL HIGH (ref 70–99)

## 2022-10-31 LAB — LACTIC ACID, PLASMA
Lactic Acid, Venous: 1.4 mmol/L (ref 0.5–1.9)
Lactic Acid, Venous: 1.2 mmol/L (ref 0.5–1.9)

## 2022-10-31 LAB — TSH: TSH: 3.511 u[IU]/mL (ref 0.350–4.500)

## 2022-10-31 LAB — T4, FREE: Free T4: 0.72 ng/dL (ref 0.61–1.12)

## 2022-10-31 MED ORDER — SODIUM CHLORIDE 0.9 % IV SOLN
1.0000 g | Freq: Once | INTRAVENOUS | Status: AC
  Administered 2022-10-31: 1 g via INTRAVENOUS
  Filled 2022-10-31: qty 10

## 2022-10-31 MED ORDER — ONDANSETRON HCL 4 MG/2ML IJ SOLN
4.0000 mg | Freq: Once | INTRAMUSCULAR | Status: AC
  Administered 2022-10-31: 4 mg via INTRAVENOUS
  Filled 2022-10-31: qty 2

## 2022-10-31 MED ORDER — SODIUM CHLORIDE 0.9 % IV BOLUS
1000.0000 mL | Freq: Once | INTRAVENOUS | Status: AC
  Administered 2022-10-31: 1000 mL via INTRAVENOUS

## 2022-10-31 NOTE — ED Provider Notes (Signed)
[facility] Provider Note    Event Date/Time   First MD Initiated Contact with Patient 10/31/22 1128     (approximate)   History   Dizziness and Hallucinations   HPI  Linda Bradford is a 67 y.o. female past medical history significant for hypertension, hyperlipidemia, diabetes, who presents to the emergency department with altered mental status.  History is provided by the patient and her son at bedside.  Patient states that over the past couple of days she has had progressively worsening hallucinations.  Has been hallucinating that others are in the room and that the door is changing sizes.  States that she was evaluated a couple of days ago in the emergency department and diagnosed with urinary tract infection.  Started on antibiotics with cefdinir but only took 1 dose.  States that her cat got the medications and she is unable to find her pill bottle.  States that she has had worsening weakness and worsening tremulous since the past couple of days.  Denies any falls or head trauma.  Denies any chest pain or shortness of breath.  Denies any nausea or vomiting.  No abdominal pain at this time.  Denies new medication changes.  States that she has had a tremor and has been evaluated by neurology in the past.  Denies any drug or alcohol use.     Physical Exam   Triage Vital Signs: ED Triage Vitals  Enc Vitals Group     BP 10/31/22 1111 123/78     Pulse Rate 10/31/22 1111 66     Resp 10/31/22 1111 18     Temp 10/31/22 1111 98.5 F (36.9 C)     Temp Source 10/31/22 1111 Oral     SpO2 10/31/22 1111 100 %     Weight 10/31/22 1111 205 lb (93 kg)     Height 10/31/22 1111 5\' 8"  (1.727 m)     Head Circumference --      Peak Flow --      Pain Score 10/31/22 1117 0     Pain Loc --      Pain Edu? --      Excl. in [place]? --     Most recent vital signs: Vitals:   10/31/22 1111 10/31/22 1516  BP: 123/78 (!) 160/73  Pulse: 66 70  Resp: 18 18  Temp: 98.5 F (36.9 C)  98 F (36.7 C)  SpO2: 100% 98%    Physical Exam Constitutional:      Appearance: She is well-developed.  HENT:     Head: Atraumatic.  Eyes:     Conjunctiva/sclera: Conjunctivae normal.  Cardiovascular:     Rate and Rhythm: Regular rhythm.  Pulmonary:     Effort: No respiratory distress.  Abdominal:     General: There is no distension.     Tenderness: There is no abdominal tenderness. There is no right CVA tenderness or left CVA tenderness.  Musculoskeletal:        General: Normal range of motion.     Cervical back: Normal range of motion.  Skin:    General: Skin is warm.  Neurological:     Mental Status: She is alert. Mental status is at baseline.     GCS: GCS eye subscore is 4. GCS verbal subscore is 5. GCS motor subscore is 6.     Cranial Nerves: Cranial nerves 2-12 are intact.     Motor: Motor function is intact.     Comments: States that she  is seeing things and people that are not supposed to be in the room.     IMPRESSION / MDM / ASSESSMENT AND PLAN / ED COURSE  I reviewed the triage vital signs and the nursing notes.  On chart review patient was recently seen a couple of days ago in the emergency department.  Patient had an MRI with and without contrast and a UA.  Diagnosed with urinary tract infection and started on cefdinir.  On chart review no history of a resistant urinary tract infection.  No culture data available.  Differential diagnosis including urinary tract infection, infectious process, electrolyte abnormality, dehydration, hyperthyroidism, intracranial hemorrhage, medication side effect.   EKG  I, Nathaniel Man, the attending physician, personally viewed and interpreted this ECG.   Rate: Normal  Rhythm: Normal sinus  Axis: Normal  Intervals: Normal  ST&T Change: None  No tachycardic or bradycardic dysrhythmias while on cardiac telemetry.  RADIOLOGY I independently reviewed imaging, my interpretation of imaging: CT head -   LABS (all labs  ordered are listed, but only abnormal results are displayed) Labs interpreted as -    Labs Reviewed  COMPREHENSIVE METABOLIC PANEL - Abnormal; Notable for the following components:      Result Value   Glucose, Bld 146 (*)    All other components within normal limits  URINALYSIS, ROUTINE W REFLEX MICROSCOPIC - Abnormal; Notable for the following components:   Color, Urine YELLOW (*)    APPearance HAZY (*)    All other components within normal limits  URINE DRUG SCREEN, QUALITATIVE ([facility] ONLY) - Abnormal; Notable for the following components:   Barbiturates, Ur Screen POSITIVE (*)    All other components within normal limits  CBG MONITORING, ED - Abnormal; Notable for the following components:   Glucose-Capillary 150 (*)    All other components within normal limits  CULTURE, BLOOD (ROUTINE X 2)  CULTURE, BLOOD (ROUTINE X 2)  URINE CULTURE  LACTIC ACID, PLASMA  LACTIC ACID, PLASMA  CBC WITH DIFFERENTIAL/PLATELET  TSH  T4, FREE  ETHANOL     MDM  Patient without an obvious findings to explain the patient's hallucinations today.  Urine without signs of an infection.  No culture data.  Normal lactic acid.  No significant leukocytosis.  Creatinine at baseline with no significant electrolyte abnormalities.  CT scan of her head without signs of intracranial hemorrhage or infarction.  No meningismus on exam and have a low suspicion for meningitis.  Low suspicion for subclinical seizures, no focal findings on exam.  History of tremors in the past and has been evaluated by neurology.  UDS is negative.  Normal free T4.  Recommended admission given her ongoing hallucinations for further workup and possible from polypharmacy.  Patient was adamant that she would not be admitted to the hospital.  Recommended that she be admitted so that they can get an MRI and further workup for her hallucinations.  She expressed understanding her and her son stated that they will follow-up as an outpatient.  Will  follow-up with her primary care physician and with neurology.  Given return precautions for any worsening symptoms.     PROCEDURES:  Critical Care performed: No  Procedures  Patient's presentation is most consistent with acute presentation with potential threat to life or bodily function.   MEDICATIONS ORDERED IN ED: Medications  cefTRIAXone (ROCEPHIN) 1 g in sodium chloride 0.9 % 100 mL IVPB (0 g Intravenous Stopped 10/31/22 1401)  sodium chloride 0.9 % bolus 1,000 mL (0 mLs Intravenous  Stopped 10/31/22 1518)  ondansetron (ZOFRAN) injection 4 mg (4 mg Intravenous Given 10/31/22 1320)    FINAL CLINICAL IMPRESSION(S) / ED DIAGNOSES   Final diagnoses:  Hallucinations     Rx / DC Orders   ED Discharge Orders     None        Note:  This document was prepared using Dragon voice recognition software and may include unintentional dictation errors.   Nathaniel Man, MD 10/31/22 [phone]

## 2022-10-31 NOTE — Progress Notes (Signed)
     I recommend emergent care at this time given inability to walk, hallucinations in setting of acute cystitis during check in.  Son with her and will bring patient to hospital.  No charge for office visit.  [name], [place] [phone] (phone) [phone] (fax)  [place]

## 2022-10-31 NOTE — ED Triage Notes (Addendum)
Pt to ED via POV from PCP. Pt was seen on 3/29 and dx with UTI. Pt placed on antibiotics and states the cat knocked them over and is now having hallucinations and increased dizziness. Pt trying to leave in triage. Pt accompanied by son. Pt tearful anxious. Son states this is not pt's baseline. Son denies etoh or drug use. CBG 150 in triage.

## 2022-10-31 NOTE — Discharge Instructions (Signed)
You are seen in the emergency department for hallucinating.  During your last visit to the emergency department while you are having hallucinations that did an MRI of your brain that did not show any obvious cause.  Today you did not have any findings of a urinary tract infection.  Your lab work was overall normal.  You had a CT scan of your head that did not show any bleeding or signs of a stroke.  It is recommended that you be admitted to the hospital for further workup for your hallucinations.  You ultimately wanted to go home.  Follow-up closely with your primary care physician.  Follow-up with your neurologist and return to the emergency department if you have worsening symptoms.

## 2022-11-01 ENCOUNTER — Other Ambulatory Visit: Payer: Self-pay

## 2022-11-01 ENCOUNTER — Emergency Department
Admission: EM | Admit: 2022-11-01 | Discharge: 2022-11-02 | Disposition: A | Discharge status: Home / Self Care | Payer: Medicare PPO | Attending: Emergency Medicine | Admitting: Emergency Medicine

## 2022-11-01 DIAGNOSIS — F411 Generalized anxiety disorder: Secondary | ICD-10-CM | POA: Diagnosis present

## 2022-11-01 DIAGNOSIS — G2581 Restless legs syndrome: Secondary | ICD-10-CM | POA: Diagnosis present

## 2022-11-01 DIAGNOSIS — F331 Major depressive disorder, recurrent, moderate: Secondary | ICD-10-CM | POA: Diagnosis present

## 2022-11-01 DIAGNOSIS — F17210 Nicotine dependence, cigarettes, uncomplicated: Secondary | ICD-10-CM | POA: Diagnosis not present

## 2022-11-01 DIAGNOSIS — I10 Essential (primary) hypertension: Secondary | ICD-10-CM | POA: Diagnosis not present

## 2022-11-01 DIAGNOSIS — E039 Hypothyroidism, unspecified: Secondary | ICD-10-CM | POA: Insufficient documentation

## 2022-11-01 DIAGNOSIS — Z96653 Presence of artificial knee joint, bilateral: Secondary | ICD-10-CM | POA: Diagnosis not present

## 2022-11-01 DIAGNOSIS — Z1152 Encounter for screening for COVID-19: Secondary | ICD-10-CM | POA: Diagnosis not present

## 2022-11-01 DIAGNOSIS — R4182 Altered mental status, unspecified: Secondary | ICD-10-CM

## 2022-11-01 DIAGNOSIS — J449 Chronic obstructive pulmonary disease, unspecified: Secondary | ICD-10-CM | POA: Insufficient documentation

## 2022-11-01 DIAGNOSIS — E119 Type 2 diabetes mellitus without complications: Secondary | ICD-10-CM | POA: Insufficient documentation

## 2022-11-01 LAB — URINALYSIS, ROUTINE W REFLEX MICROSCOPIC
Bacteria, UA: NONE SEEN
Bilirubin Urine: NEGATIVE
Glucose, UA: NEGATIVE mg/dL
Hgb urine dipstick: NEGATIVE
Ketones, ur: NEGATIVE mg/dL
Leukocytes,Ua: NEGATIVE
Nitrite: NEGATIVE
Protein, ur: NEGATIVE mg/dL
Specific Gravity, Urine: 1.006 (ref 1.005–1.030)
Squamous Epithelial / HPF: NONE SEEN /HPF (ref 0–5)
WBC, UA: NONE SEEN WBC/hpf (ref 0–5)
pH: 6 (ref 5.0–8.0)

## 2022-11-01 LAB — URINE CULTURE: Culture: <10000 — AB

## 2022-11-01 LAB — CBC
HCT: 43.4 % (ref 36.0–46.0)
Hemoglobin: 13.9 g/dL (ref 12.0–15.0)
MCH: 29.4 pg (ref 26.0–34.0)
MCHC: 32 g/dL (ref 30.0–36.0)
MCV: 91.9 fL (ref 80.0–100.0)
Platelets: 231 10*3/uL (ref 150–400)
RBC: 4.72 MIL/uL (ref 3.87–5.11)
RDW: 13.4 % (ref 11.5–15.5)
WBC: 6.1 10*3/uL (ref 4.0–10.5)
nRBC: 0 % (ref 0.0–0.2)

## 2022-11-01 LAB — COMPREHENSIVE METABOLIC PANEL
ALT: 19 U/L (ref 0–44)
AST: 24 U/L (ref 15–41)
Albumin: 4 g/dL (ref 3.5–5.0)
Alkaline Phosphatase: 78 U/L (ref 38–126)
Anion gap: 8 (ref 5–15)
BUN: 15 mg/dL (ref 8–23)
CO2: 27 mmol/L (ref 22–32)
Calcium: 9.2 mg/dL (ref 8.9–10.3)
Chloride: 99 mmol/L (ref 98–111)
Creatinine, Ser: 0.62 mg/dL (ref 0.44–1.00)
GFR, Estimated: >60 mL/min (ref >60)
Glucose, Bld: 156 mg/dL — ABNORMAL HIGH (ref 70–99)
Potassium: 4.4 mmol/L (ref 3.5–5.1)
Sodium: 134 mmol/L — ABNORMAL LOW (ref 135–145)
Total Bilirubin: 0.5 mg/dL (ref 0.3–1.2)
Total Protein: 7.1 g/dL (ref 6.5–8.1)

## 2022-11-01 LAB — SARS CORONAVIRUS 2 BY RT PCR: SARS Coronavirus 2 by RT PCR: NEGATIVE

## 2022-11-01 MED ORDER — BUPROPION HCL ER (XL) 150 MG PO TB24
300.0000 mg | ORAL_TABLET | Freq: Every day | ORAL | Status: DC
  Administered 2022-11-01 – 2022-11-02 (×2): 300 mg via ORAL
  Filled 2022-11-01 (×2): qty 2

## 2022-11-01 MED ORDER — DULOXETINE HCL 60 MG PO CPEP
60.0000 mg | ORAL_CAPSULE | Freq: Two times a day (BID) | ORAL | Status: DC
  Administered 2022-11-01 – 2022-11-02 (×2): 60 mg via ORAL
  Filled 2022-11-01 (×2): qty 1

## 2022-11-01 NOTE — ED Provider Notes (Signed)
[facility] Provider Note    Event Date/Time   First MD Initiated Contact with Patient 11/01/22 1510     (approximate)  History   Chief Complaint: Shaking  HPI  Luca Sanangelo is a 67 y.o. female with a past medical history of anemia, anxiety, COPD, diabetes, gastric reflux, hypertension, presents to the emergency department for altered mental status.  Patient was seen in the emergency department yesterday for altered mental status and hallucinations.  Has also been having weakness symptoms as well.  Was told that she was to be admitted yesterday but ultimately changed her mind and went home.  Patient continued to feel altered and confused and is hearing things today so she came back to the emergency department for evaluation.  Overall patient had a negative/reassuring workup yesterday.  Denies any urinary symptoms.  Denies any fever.  Denies any cough or congestion.  Physical Exam   Triage Vital Signs: ED Triage Vitals [11/01/22 1040]  Enc Vitals Group     BP 125/73     Pulse Rate 68     Resp 18     Temp 97.8 F (36.6 C)     Temp Source Oral     SpO2 93 %     Weight      Height      Head Circumference      Peak Flow      Pain Score 0     Pain Loc      Pain Edu?      Excl. in [place]?     Most recent vital signs: Vitals:   11/01/22 1040  BP: 125/73  Pulse: 68  Resp: 18  Temp: 97.8 F (36.6 C)  SpO2: 93%    General: Awake, no distress.  CV:  Good peripheral perfusion.  Regular rate and rhythm  Resp:  Normal effort.  Equal breath sounds bilaterally.  Abd:  No distention.  Soft, nontender.  No rebound or guarding.  ED Results / Procedures / Treatments   MEDICATIONS ORDERED IN ED: Medications - No data to display   IMPRESSION / MDM / [place] / ED COURSE  I reviewed the triage vital signs and the nursing notes.  Patient's presentation is most consistent with acute presentation with potential threat to life or bodily  function.  Patient presents emergency department for symptoms of weakness, confusion, hearing and seeing things at times that are not present.  Patient was seen in the emergency department yesterday for the same symptoms ultimately had a reassuring workup however due to the confusion and Hallucinations they were going to admit the patient but the patient ultimately decided to go home.  She is back today due to continued symptoms.  Patient's workup from yesterday showed overall reassuring CBC chemistry negative urinalysis was barbiturate positive on her urine drug screen.  Patient is on quite a few home medications, could very likely be more polypharmacy induced.  Patient denies any drugs or alcohol.  Given the patient's acute delirium discussed with the hospitalist for admission however they feel that this is more likely a psychiatric issue although the patient has no long-term psychiatric conditions besides depression.  They would feel more comfortable with psychiatry evaluating first and if cleared by psychiatry then the hospitalist would be agreeable to admit the patient.  Will have psychiatry TTS evaluate.  Psychiatry and TTS have evaluated.  They recommended admission to inpatient psychiatric unit.  Currently awaiting bed availability.  We will move to the  behavioral section of the emergency department.  FINAL CLINICAL IMPRESSION(S) / ED DIAGNOSES   Altered Mental status    Note:  This document was prepared using Dragon voice recognition software and may include unintentional dictation errors.   Harvest Dark, MD 11/01/22 1718

## 2022-11-01 NOTE — ED Notes (Signed)
Pt belongings include 1 purse, 1 phone, 1 shirt, 1 bra, 2 shoes, 1 pants

## 2022-11-01 NOTE — ED Triage Notes (Signed)
Pt here with shaking and loss of balance. Pt was here yesterday with hallucinations. Pt was supposed to be admitted but then did not want to and went home. Pt now thinks she needs to be admitted. Pt is not hallucinating today but is altered per the family. Pt denies pain or injury.

## 2022-11-01 NOTE — BH Assessment (Signed)
Adult/GERO MH  Referral information for Psychiatric Hospitalization faxed to:   [facility] ([place]-[phone]---[phone]---[phone])   [facility] [phone] -or- [phone], [phone])   [facility] ([phone] or [phone]),  . [facility] ([phone] -or- [phone]), 

## 2022-11-01 NOTE — ED Notes (Signed)
This NT gave pt PM snack at this time.

## 2022-11-01 NOTE — BH Assessment (Signed)
Comprehensive Clinical Assessment (CCA) Note  11/01/2022 Linda Bradford [id number]  Linda Bradford, 67 year old female who presents to [facility] ED voluntarily for treatment. Per triage note, Pt here with shaking and loss of balance. Pt was here yesterday with hallucinations. Pt was supposed to be admitted but then did not want to and went home. Pt now thinks she needs to be admitted. Pt is not hallucinating today but is altered per the family. Pt denies pain or injury.   During TTS assessment pt presents alert and oriented x 4, restless but cooperative, and mood-congruent with affect. The pt does not appear to be responding to internal or external stimuli. Neither is the pt presenting with any delusional thinking. Pt verified the information provided to triage RN.   Patient presented to the ED with son, Rolla Plate at bedside. Pt identifies her main complaint to be that for the past 4-5 days she has not been able to walk or talk. Patient reports being off balance, and stuttering. Patient states she was seen at Urgent Care yesterday and dx with a UTI. Patient also mentioned recent stressful events going on in her family. Patient states her husband of 24 years passed away on 09-24-22. She reports she has been dealing with his loss while also trying to care for her sister-in-law who has dementia. Meanwhile, her 2 sons were feuding, and patient reports she got involved which further exacerbated her stress levels. "I feel like the earth is coming down on me." Patient states she has a ringing in her ear, she has not slept or eaten in 3 days, and she has been experiencing hallucinations when her eyes are closed. "I can't seem to turn my brain off." Patient denies using any illicit substances and alcohol. Pt reports no prior INPT hx nor is she seeing a therapist for outpatient services. Patient endorses passive SI. "There are times when I wish I would fall asleep and not wake up." Patient states she is on several medications and  takes them as prescribed.    Per Christal NP, patient is recommended for inpatient psychiatric admission.   Chief Complaint:  Chief Complaint  Patient presents with   Shaking   Visit Diagnosis: Depression and anxiety    CCA Screening, Triage and Referral (STR)  Patient Reported Information How did you hear about [place]? Self  Referral name: No data recorded Referral phone number: No data recorded  Whom do you see for routine medical problems? No data recorded Practice/Facility Name: No data recorded Practice/Facility Phone Number: No data recorded Name of Contact: No data recorded Contact Number: No data recorded Contact Fax Number: No data recorded Prescriber Name: No data recorded Prescriber Address (if known): No data recorded  What Is the Reason for Your Visit/Call Today? Patient came to ED for Parkinson like symptoms, depression, and possible side effects of UTI.  How Long Has This Been Causing You Problems? 1 wk - 1 month  What Do You Feel Would Help You the Most Today? Treatment for Depression or other mood problem; Medication(s); Stress Management   Have You Recently Been in Any Inpatient Treatment (Hospital/Detox/Crisis Center/28-Day Program)? No data recorded Name/Location of Program/Hospital:No data recorded How Long Were You There? No data recorded When Were You Discharged? No data recorded  Have You Ever Received Services From [facility] Before? No data recorded Who Do You See at [facility]? No data recorded  Have You Recently Had Any Thoughts About Hurting Yourself? Yes  Are You Planning to Commit Suicide/Harm  Yourself At This time? No   Have you Recently Had Thoughts About [place]? No  Explanation: No data recorded  Have You Used Any Alcohol or Drugs in the Past 24 Hours? No  How Long Ago Did You Use Drugs or Alcohol? No data recorded What Did You Use and How Much? No data recorded  Do You Currently Have a Therapist/Psychiatrist?  No  Name of Therapist/Psychiatrist: No data recorded  Have You Been Recently Discharged From Any Office Practice or Programs? No  Explanation of Discharge From Practice/Program: No data recorded    CCA Screening Triage Referral Assessment Type of Contact: Face-to-Face  Is this Initial or Reassessment? No data recorded Date Telepsych consult ordered in [facility]:  No data recorded Time Telepsych consult ordered in [facility]:  No data recorded  Patient Reported Information Reviewed? No data recorded Patient Left Without Being Seen? No data recorded Reason for Not Completing Assessment: No data recorded  Collateral Involvement: Son, Rolla Plate   Does Patient Have a [place]? No data recorded Name and Contact of Legal Guardian: No data recorded If Minor and Not Living with Parent(s), Who has Custody? No data recorded Is CPS involved or ever been involved? Never  Is APS involved or ever been involved? Never   Patient Determined To Be At Risk for Harm To Self or Others Based on Review of Patient Reported Information or Presenting Complaint? Yes, for Self-Harm  Method: No Plan  Availability of Means: Has close by  Intent: Vague intent or NA  Notification Required: No data recorded Additional Information for Danger to Others Potential: No data recorded Additional Comments for Danger to Others Potential: No data recorded Are There Guns or Other Weapons in Your Home? Yes  Types of Guns/Weapons: No data recorded Are These Weapons Safely Secured?                            Yes  Who Could Verify You Are Able To Have These Secured: No data recorded Do You Have any Outstanding Charges, Pending Court Dates, Parole/Probation? No data recorded Contacted To Inform of Risk of Harm To Self or Others: No data recorded  Location of Assessment: [facility] ED   Does Patient Present under Involuntary Commitment? No  IVC Papers Initial File Date: No data recorded  [place] of Residence:  [place]   Patient Currently Receiving the Following Services: Medication Management   Determination of Need: Emergent (2 hours)   Options For Referral: ED Visit; Inpatient Hospitalization; Outpatient Therapy; Medication Management     CCA Biopsychosocial Intake/Chief Complaint:  No data recorded Current Symptoms/Problems: No data recorded  Patient Reported Schizophrenia/Schizoaffective Diagnosis in Past: No   Strengths: Patient is able to communicate and verbalize needs.  Preferences: No data recorded Abilities: No data recorded  Type of Services Patient Feels are Needed: No data recorded  Initial Clinical Notes/Concerns: No data recorded  Mental Health Symptoms Depression:   Change in energy/activity; Difficulty Concentrating; Tearfulness; Increase/decrease in appetite   Duration of Depressive symptoms:  Greater than two weeks   Mania:   N/A   Anxiety:    Difficulty concentrating; Restlessness; Worrying; Tension; Sleep   Psychosis:   Hallucinations   Duration of Psychotic symptoms:  Less than six months   Trauma:   N/A   Obsessions:   N/A   Compulsions:   N/A   Inattention:   N/A   Hyperactivity/Impulsivity:   N/A   Oppositional/Defiant Behaviors:  N/A   Emotional Irregularity:   Potentially harmful impulsivity   Other Mood/Personality Symptoms:  No data recorded   Mental Status Exam Appearance and self-care  Stature:   Average   Weight:   Average weight   Clothing:   Neat/clean   Grooming:   Normal   Cosmetic use:   None   Posture/gait:   Tense   Motor activity:   Tremor   Sensorium  Attention:   Normal   Concentration:   Anxiety interferes   Orientation:   X5   Recall/memory:   Normal   Affect and Mood  Affect:   Anxious; Depressed   Mood:   Anxious; Depressed   Relating  Eye contact:   Normal   Facial expression:   Anxious; Depressed   Attitude toward examiner:   Cooperative   Thought  and Language  Speech flow:  Clear and Coherent   Thought content:   Appropriate to Mood and Circumstances   Preoccupation:   None   Hallucinations:   Visual   Organization:  No data recorded  [employer] of Knowledge:   Average   Intelligence:   Average   Abstraction:   Normal   Judgement:   Impaired   Reality Testing:   Adequate   Insight:   Fair   Decision Making:   Impulsive   Social Functioning  Social Maturity:   Impulsive   Social Judgement:   Normal   Stress  Stressors:   Family conflict; Grief/losses; Transitions; Illness   Coping Ability:   Overwhelmed   Skill Deficits:   Communication   Supports:   Family     Religion:    Leisure/Recreation:    Exercise/Diet: Exercise/Diet Do You Have Any Trouble Sleeping?: Yes Explanation of Sleeping Difficulties: Patient reports she has not slept in 3 days.   CCA Employment/Education Employment/Work Situation:    Education:     CCA Family/Childhood History Family and Relationship History: Family history Marital status: Widowed Does patient have children?: Yes  Childhood History:     Child/Adolescent Assessment:     CCA Substance Use Alcohol/Drug Use: Alcohol / Drug Use Pain Medications: See PTA Prescriptions: See PTA Over the Counter: See PTA History of alcohol / drug use?: No history of alcohol / drug abuse                         ASAM's:  Six Dimensions of Multidimensional Assessment  Dimension 1:  Acute Intoxication and/or Withdrawal Potential:      Dimension 2:  Biomedical Conditions and Complications:      Dimension 3:  Emotional, Behavioral, or Cognitive Conditions and Complications:     Dimension 4:  Readiness to Change:     Dimension 5:  Relapse, Continued use, or Continued Problem Potential:     Dimension 6:  Recovery/Living Environment:     ASAM Severity Score:    ASAM Recommended Level of Treatment:     Substance use  Disorder (SUD)    Recommendations for Services/Supports/Treatments:    DSM5 Diagnoses: Patient Active Problem List   Diagnosis Date Noted   Hallucinations 10/31/2022   Weakness 10/31/2022   Binge eating 10/20/2022   Breast pain, left 10/06/2022   Colon cancer screening declined 10/06/2022   Nausea 07/20/2022   Chronic idiopathic constipation 07/20/2022   Nervous indigestion 07/20/2022   Diabetes mellitus type 2 with neurological manifestations 07/12/2022   Need for COVID-19 vaccine 07/12/2022   Hypokalemia 06/30/2022  Hyponatremia 06/30/2022   Acute exacerbation of chronic low back pain 06/23/2022   Trochanteric bursitis of right hip 04/05/2022   Acquired trigger finger of left ring finger 03/17/2022   Stiffness of right knee 02/03/2022   Pain in joint of right knee 01/24/2022   S/P TKR (total knee replacement) using cement, right 01/10/2022   Vulvar rash 12/07/2021   LVH (left ventricular hypertrophy) 10/24/2021   Aortic regurgitation [id number]   Systolic murmur [id number]   Thrush of mouth and esophagus 10/01/2021   Skin yeast infection 10/01/2021   Vaginal yeast infection 10/01/2021   Current chronic use of inhaled steroid 10/01/2021   Chronic pain syndrome 10/01/2021   Mixed hyperlipidemia 10/01/2021   Nodule of left lung 09/20/2021   Hyperlipidemia associated with type 2 diabetes mellitus 09/02/2021   Type 2 diabetes mellitus with hyperglycemia, without long-term current use of insulin 09/02/2021   Hypertension associated with type 2 diabetes mellitus 09/02/2021   Vaginal cyst 09/02/2021   COPD with exacerbation 09/02/2021   Osteoarthritis of left knee 06/08/2021   History of total knee arthroplasty 05/17/2021   S/P total knee replacement, left 05/10/2021   Healthcare maintenance 05/03/2021   Contusion of coccyx 03/15/2021   Osteoarthritis of knees, bilateral 04/22/2020   Chronic obstructive pulmonary disease with acute exacerbation 12/31/2019   Cough  12/31/2019   Biliary dyskinesia    Diverticulitis of large intestine with abscess 05/27/2018   Lateral epicondylitis 07/07/2017   Personal history of tobacco use, presenting hazards to health 05/03/2016   Bronchitis, chronic 03/03/2015   Depression, major, recurrent, moderate 11/05/2014   Anxiety, generalized 11/05/2014   Alcohol abuse, in remission 11/05/2014   Nicotine addiction 11/05/2014   H/O: osteoarthritis 11/05/2014   Fibromyalgia 11/05/2014   Restless leg syndrome 11/05/2014   Arthralgia of hip 01/13/2014   Obesity (BMI 30-39.9) 01/13/2014   Disordered sleep 01/13/2014   Airway hyperreactivity 12/17/2013   Depression 12/17/2013   Adult hypothyroidism 12/17/2013   Current tobacco use 12/17/2013   Basal cell carcinoma 11/04/2013   Arthropathia 02/08/2012   Arthritis of knee, left 02/08/2012    Patient Centered Plan: Patient is on the following Treatment Plan(s):  Anxiety and Depression   Referrals to Alternative Service(s): Referred to Alternative Service(s):   Place:   Date:   Time:    Referred to Alternative Service(s):   Place:   Date:   Time:    Referred to Alternative Service(s):   Place:   Date:   Time:    Referred to Alternative Service(s):   Place:   Date:   Time:      @[facility]LL[facility]ARE @  [place], Counselor, LCAS-A

## 2022-11-01 NOTE — Consult Note (Signed)
[facility] Consult   Reason for Consult:  Psychiatric Evaluation / AMS  Referring Physician:  Dr. Kerman Passey  Patient Identification: Linda Bradford MRN:  [id number] Principal Diagnosis: Depression, major, recurrent, moderate Diagnosis:  Principal Problem:   Depression, major, recurrent, moderate Active Problems:   Anxiety, generalized   Restless leg syndrome   Total Time spent with patient: 45 minutes  Subjective:   Linda Bradford is a 67 y.o. female patient admitted with 'altered mental status' and shakiness.  Patient seen and chart reviewed.  On evaluation, patient is alert and oriented. She appears anxious, restless, and exhibits a depressed mood with a flat affect. She is earful throughout the assessment. She endorses visual hallucinations when her eyes are closed and reports an inability to walk or talk for four to five days. She describes a feeling of the earth coming down on her and reports ringing in her ears. The patient's symptoms began after her sons got into an argument three days ago, which overwhelmed her and led to a desire to go to sleep and not wake up. However, she did not make any attempts at suicide. She acknowledge being under significant stress due to her husband's death in 11-Sep-2022, medical problems, financial issues, and family stressors. She eports poor sleep due to restless legs and a decreased appetite. She denies suicidal of homicidal ideation. She denies auditory hallucinations She does not appear to be responding ot internal stimuli, nor is there any evidence of delusional thinking. The patient reports a past history of alcohol abuse, has been sober for 29 years and denies any drug use. She denies illicit drug use. UDS + Barbiturates. BAL <10. PDMP review reveals no active prescriptions.  Patient gives consent to speak with son, Donalynn Furlong.    Collateral information from son, Donalynn Furlong at bedside: The patient's son reveals her symptoms started  on Friday, trouble with balance, shaking, stuttering, hallucinations, numb feet and fingers. Son states test revealed positive UTI for which she was prescribed antibiotics, but medication was lost due to patients' cat knocking pill bottle off the counter. Son reports no safety concerns, able to safety-proof house and lock firearm away, He reports symptoms possibly related to family stressors; husband died 2022/09/14, progressive tremors. He reports the patient took an Ativan from an old prescription over the weekend. He reports she was in the emergency department yesterday, considering admission for tests related to Parkinson-like symptoms, and neurology, but patient declined. Discussed inpatient geriatric psychiatric hospitalization, son and patient agreeable.   Plan: Inpatient geriatric psychiatric hospitalization recommended for safety and stabilization when medically cleared.    HPI:  Per Dr. Kerman Passey, Linda Bradford is a 67 y.o. female with a past medical history of anemia, anxiety, COPD, diabetes, gastric reflux, hypertension, presents to the emergency department for altered mental status.  Patient was seen in the emergency department yesterday for altered mental status and hallucinations.  Has also been having weakness symptoms as well.  Was told that she was to be admitted yesterday but ultimately changed her mind and went home.  Patient continued to feel altered and confused and is hearing things today so she came back to the emergency department for evaluation.  Overall patient had a negative/reassuring workup yesterday.  Denies any urinary symptoms.  Denies any fever.  Denies any cough or congestion.   Past Psychiatric History: The patient has a psychiatric history significant for depression, anxiety, restless leg syndrome (RLS). She has a history of psychiatric hospitalization for alcohol abuse in 1995  and a past suicide attempt 30 to 35 years ago, during which she took Tylenol PM and alcohol  with the intention of not waking up.   Risk to Self:  Yes Risk to Others:  No Prior Inpatient Therapy:   Prior Outpatient Therapy:    Past Medical History:  Past Medical History:  Diagnosis Date   Anemia    Anxiety    Arthritis    Cancer    skin cancer, removed   COPD (chronic obstructive pulmonary disease)    Depression    Diabetes mellitus without complication    Disordered sleep 01/13/2014   Dyspnea    Dysrhythmia    occasional pvc's. not being treated.   Fatigue    GERD (gastroesophageal reflux disease)    Headache    Heart murmur    Hypertension    Hypothyroidism    Pneumonia    Thyroid disease    Tremors of nervous system     Past Surgical History:  Procedure Laterality Date   APPENDECTOMY     CHOLECYSTECTOMY N/A 07/01/2019   Procedure: LAPAROSCOPIC CHOLECYSTECTOMY;  Surgeon: Olean Ree, MD;  Location: [facility];  Service: General;  Laterality: N/A;   COLECTOMY WITH COLOSTOMY CREATION/HARTMANN PROCEDURE N/A 09/03/2018   Error, this was not done.   COLONOSCOPY WITH PROPOFOL N/A 03/08/2019   Procedure: COLONOSCOPY WITH PROPOFOL;  Surgeon: Virgel Manifold, MD;  Location: [facility];  Service: Endoscopy;  Laterality: N/A;   CYSTOSCOPY WITH STENT PLACEMENT Bilateral 09/03/2018   Procedure: CYSTOSCOPY WITH STENT PLACEMENT-LIGHTED STENTS;  Surgeon: Hollice Espy, MD;  Location: [facility];  Service: Urology;  Laterality: Bilateral;   LAPAROSCOPIC SIGMOID COLECTOMY N/A 09/03/2018   Procedure: LAPAROSCOPIC SIGMOID COLECTOMY;  Surgeon: Olean Ree, MD;  Location: [facility];  Service: General;  Laterality: N/A;   percutaneous drainage tube  07/2018   2nd tube placed. not healing in colon, causing a fistula   TONSILLECTOMY     TOTAL KNEE ARTHROPLASTY Left 05/10/2021   Procedure: TOTAL KNEE ARTHROPLASTY;  Surgeon: Lovell Sheehan, MD;  Location: [facility];  Service: Orthopedics;  Laterality: Left;   TOTAL KNEE ARTHROPLASTY Right 01/10/2022   Procedure: TOTAL KNEE  ARTHROPLASTY;  Surgeon: Lovell Sheehan, MD;  Location: [facility];  Service: Orthopedics;  Laterality: Right;   TUBAL LIGATION     Family History:  Family History  Problem Relation Age of Onset   Lung cancer Mother    Alcohol abuse Sister    Arthritis Sister    Alcohol abuse Brother    Heart disease Brother 61   Heart attack Brother    Pancreatic cancer Maternal Aunt    Diabetes Maternal Aunt    Alcohol abuse Son    Drug abuse Son    Drug abuse Son    Heart disease Father 22   Diabetes Sister    Breast cancer Brother    Alcohol abuse Maternal Uncle    Alcohol abuse Maternal Grandmother    Diabetes Maternal Grandmother    Family Psychiatric  History: Charted history of alcohol and drug abuse Social History:  Social History   Substance and Sexual Activity  Alcohol Use No   Alcohol/week: 0.0 standard drinks of alcohol     Social History   Substance and Sexual Activity  Drug Use No    Social History   Socioeconomic History   Marital status: Widowed    Spouse name: Clair Gulling   Number of children: 2   Years of education: Not on file  Highest education level: Not on file  Occupational History   Occupation: medical coder    Comment: currently employed  Tobacco Use   Smoking status: Every Day    Packs/day: 2.00    Years: 45.00    Additional pack years: 0.00    Total pack years: 90.00    Types: Cigarettes    Start date: 03/02/1980   Smokeless tobacco: Never   Tobacco comments:    1.5PPD 05/16/2022  Vaping Use   Vaping Use: Former   Devices: only for a month. did not like  Substance and Sexual Activity   Alcohol use: No    Alcohol/week: 0.0 standard drinks of alcohol   Drug use: No   Sexual activity: Not Currently  Other Topics Concern   Not on file  Social History Narrative   Live with hubby at home, sister susie with down syndrome   Social Determinants of Health   Financial Resource Strain: Low Risk  (04/18/2022)   Overall Financial Resource Strain (CARDIA)     Difficulty of Paying Living Expenses: Not hard at all  Food Insecurity: No Food Insecurity (04/18/2022)   Hunger Vital Sign    Worried About Running Out of Food in the Last Year: Never true    Ran Out of Food in the Last Year: Never true  Transportation Needs: No Transportation Needs (04/18/2022)   PRAPARE - Hydrologist (Medical): No    Lack of Transportation (Non-Medical): No  Physical Activity: Insufficiently Active (04/18/2022)   Exercise Vital Sign    Days of Exercise per Week: 3 days    Minutes of Exercise per Session: 30 min  Stress: No Stress Concern Present (04/18/2022)   [place]    Feeling of Stress : Only a little  Social Connections: Socially Isolated (04/18/2022)   Social Connection and Isolation Panel [NHANES]    Frequency of Communication with Friends and Family: Once a week    Frequency of Social Gatherings with Friends and Family: Never    Attends Religious Services: Never    Marine scientist or Organizations: No    Attends Archivist Meetings: Never    Marital Status: Married   Additional Social History:    Allergies:   Allergies  Allergen Reactions   Dexilant [Dexlansoprazole] Nausea Only and Other (See Comments)    Dizziness    Labs:  Results for orders placed or performed during the hospital encounter of 11/01/22 (from the past 48 hour(s))  Comprehensive metabolic panel     Status: Abnormal   Collection Time: 11/01/22 10:42 AM  Result Value Ref Range   Sodium 134 (L) 135 - 145 mmol/L   Potassium 4.4 3.5 - 5.1 mmol/L   Chloride 99 98 - 111 mmol/L   CO2 27 22 - 32 mmol/L   Glucose, Bld 156 (H) 70 - 99 mg/dL    Comment: Glucose reference range applies only to samples taken after fasting for at least 8 hours.   BUN 15 8 - 23 mg/dL   Creatinine, Ser 0.62 0.44 - 1.00 mg/dL   Calcium 9.2 8.9 - 10.3 mg/dL   Total Protein 7.1 6.5 - 8.1 g/dL    Albumin 4.0 3.5 - 5.0 g/dL   AST 24 15 - 41 U/L   ALT 19 0 - 44 U/L   Alkaline Phosphatase 78 38 - 126 U/L   Total Bilirubin 0.5 0.3 - 1.2 mg/dL   GFR, Estimated >  60 >60 mL/min    Comment: (NOTE) Calculated using the CKD-EPI Creatinine Equation (2021)    Anion gap 8 5 - 15    Comment: Performed at [facility], [address] [place], [place] [address]  CBC     Status: None   Collection Time: 11/01/22 10:42 AM  Result Value Ref Range   WBC 6.1 4.0 - 10.5 K/uL   RBC 4.72 3.87 - 5.11 MIL/uL   Hemoglobin 13.9 12.0 - 15.0 g/dL   HCT 43.4 36.0 - 46.0 %   MCV 91.9 80.0 - 100.0 fL   MCH 29.4 26.0 - 34.0 pg   MCHC 32.0 30.0 - 36.0 g/dL   RDW 13.4 11.5 - 15.5 %   Platelets 231 150 - 400 K/uL   nRBC 0.0 0.0 - 0.2 %    Comment: Performed at [facility], [address] [place], [place] [address]  Urinalysis, Routine w reflex microscopic -Urine, Clean Catch     Status: Abnormal   Collection Time: 11/01/22  3:36 PM  Result Value Ref Range   Color, Urine YELLOW (A) YELLOW   APPearance CLEAR (A) CLEAR   Specific Gravity, Urine 1.006 1.005 - 1.030   pH 6.0 5.0 - 8.0   Glucose, UA NEGATIVE NEGATIVE mg/dL   Hgb urine dipstick NEGATIVE NEGATIVE   Bilirubin Urine NEGATIVE NEGATIVE   Ketones, ur NEGATIVE NEGATIVE mg/dL   Protein, ur NEGATIVE NEGATIVE mg/dL   Nitrite NEGATIVE NEGATIVE   Leukocytes,Ua NEGATIVE NEGATIVE   RBC / HPF 0-5 0 - 5 RBC/hpf   WBC, UA NONE SEEN 0 - 5 WBC/hpf   Bacteria, UA NONE SEEN NONE SEEN   Squamous Epithelial / HPF NONE SEEN 0 - 5 /HPF    Comment: Performed at [facility], [address], [place], [place] [address]    Current Facility-Administered Medications  Medication Dose Route Frequency Provider Last Rate Last Admin   buPROPion (WELLBUTRIN XL) 24 hr tablet 300 mg  300 mg Oral Daily Deionte Spivack H, NP       DULoxetine (CYMBALTA) DR capsule 60 mg  60 mg Oral BID Roneisha Stern H, NP       Current Outpatient  Medications  Medication Sig Dispense Refill   acetaminophen (TYLENOL) 500 MG tablet Take 1,000 mg by mouth every 6 (six) hours as needed for mild pain or moderate pain.      amLODipine (NORVASC) 5 MG tablet Take 1 tablet (5 mg total) by mouth daily. 60 tablet 0   aspirin 81 MG chewable tablet Chew 1 tablet (81 mg total) by mouth 2 (two) times daily. 60 tablet 0   benzonatate (TESSALON) 200 MG capsule TAKE 1 CAPSULE BY MOUTH 3 TIMES DAILY ASNEEDED 90 capsule 0   blood glucose meter kit and supplies Dispense based on patient and insurance preference. Once daily. (FOR ICD-10 E10.9, E11.9). 1 each 0   buPROPion (WELLBUTRIN XL) 300 MG 24 hr tablet Take 1 tablet (300 mg total) by mouth daily. 90 tablet 3   cefdinir (OMNICEF) 300 MG capsule Take 1 capsule (300 mg total) by mouth 2 (two) times daily for 5 days. 10 capsule 0   Cholecalciferol (VITAMIN D) 50 MCG (2000 UT) tablet Take 2,000 Units by mouth daily.     diclofenac (VOLTAREN) 50 MG EC tablet      diclofenac Sodium (VOLTAREN) 1 % GEL      DULoxetine (CYMBALTA) 60 MG capsule Take 1 capsule (60 mg total) by mouth 2 (two) times  daily. 180 capsule 1   fluticasone (FLONASE) 50 MCG/ACT nasal spray Place 2 sprays into both nostrils daily. 48 g 1   Fluticasone-Umeclidin-Vilant (TRELEGY ELLIPTA) 100-62.5-25 MCG/ACT AEPB Inhale 1 puff into the lungs daily. 1 each 11   gabapentin (NEURONTIN) 300 MG capsule Take 2 capsules PO every morning; Take 3 capsules PO every evening. 450 capsule 3   glucose blood (CONTOUR NEXT TEST) test strip To check blood sugar once daily 100 each 12   ipratropium-albuterol (DUONEB) 0.5-2.5 (3) MG/3ML SOLN Take 3 mLs by nebulization every 6 (six) hours as needed. 360 mL 11   levothyroxine (SYNTHROID) 75 MCG tablet Take 1 tablet (75 mcg total) by mouth daily before breakfast. 90 tablet 3   loperamide (IMODIUM A-D) 2 MG tablet Take 2-4 mg by mouth 4 (four) times daily as needed for diarrhea or loose stools.     meloxicam (MOBIC) 15  MG tablet Take 1 tablet (15 mg total) by mouth daily. 90 tablet 1   metFORMIN (GLUCOPHAGE-XR) 500 MG 24 hr tablet Take 2 tablets (1,000 mg total) by mouth 2 (two) times daily with a meal. 360 tablet 1   metoCLOPramide (REGLAN) 10 MG tablet Take 1 tablet (10 mg total) by mouth every 8 (eight) hours as needed for nausea. 90 tablet 0   metoprolol succinate (TOPROL-XL) 50 MG 24 hr tablet TAKE 1 TABLET BY MOUTH ONCE DAILY WITH OR IMMEDIATELY FOLLOWING A MEAL 90 tablet 0   montelukast (SINGULAIR) 10 MG tablet Take 1 tablet (10 mg total) by mouth at bedtime. 90 tablet 1   nystatin (MYCOSTATIN) 100000 UNIT/ML suspension Take 5 mLs (500,000 Units total) by mouth 4 (four) times daily. 473 mL 0   nystatin (MYCOSTATIN/NYSTOP) powder Apply 1 application topically 3 (three) times daily. 60 g 0   oxybutynin (DITROPAN-XL) 5 MG 24 hr tablet Take 1 tablet (5 mg total) by mouth at bedtime. 90 tablet 5   pantoprazole (PROTONIX) 20 MG tablet TAKE 1 TABLET(20 MG) BY MOUTH TWICE DAILY BEFORE A MEAL 180 tablet 0   primidone (MYSOLINE) 50 MG tablet Take 0.5 tablets (25 mg total) by mouth 2 (two) times daily.     rosuvastatin (CRESTOR) 10 MG tablet Take 1 tablet (10 mg total) by mouth daily. 90 tablet 3   sodium chloride 1 g tablet Take 1 tablet (1 g total) by mouth 2 (two) times daily with a meal.     theophylline (THEODUR) 300 MG 12 hr tablet Take 1 tablet (300 mg total) by mouth daily. 90 tablet 0    Musculoskeletal: Strength & Muscle Tone: within normal limits Gait & Station:  Did not observe  Patient leans: N/A            Psychiatric Specialty Exam:  Presentation  General Appearance: Casual  Eye Contact:Good  Speech:Clear and Coherent  Speech Volume:Normal  Handedness:Right   Mood and Affect  Mood:Anxious; Depressed  Affect:Depressed; Flat   Thought Process  Thought Processes:Coherent  Descriptions of Associations:Intact  Orientation:Full (Time, Place and Person)  Thought  Content:Logical; WDL  History of Schizophrenia/Schizoaffective disorder:No data recorded Duration of Psychotic Symptoms:No data recorded Hallucinations:Hallucinations: None  Ideas of Reference:None  Suicidal Thoughts:Suicidal Thoughts: No  Homicidal Thoughts:Homicidal Thoughts: No   Sensorium  Memory:Immediate Good; Recent Good  Judgment:Good  Insight:Good   Executive Functions  Concentration:Good  Attention Span:Good  [place] of Knowledge:Good  Language:Good   Psychomotor Activity  Psychomotor Activity:Psychomotor Activity: Tremor; Restlessness   Assets  Assets:Communication Skills; Desire for Improvement; Resilience; Social  Support   Sleep  Sleep:Sleep: Poor   Physical Exam: Physical Exam Vitals and nursing note reviewed.  HENT:     Head: Normocephalic and atraumatic.  Pulmonary:     Effort: Pulmonary effort is normal.  Musculoskeletal:        General: Normal range of motion.     Cervical back: Normal range of motion.  Neurological:     Mental Status: She is alert and oriented to person, place, and time.     Comments: Tremulous   Psychiatric:        Attention and Perception: Attention and perception normal. She does not perceive auditory or visual hallucinations.        Mood and Affect: Mood is anxious and depressed. Affect is flat and tearful.        Speech: Speech normal.        Behavior: Behavior normal. Behavior is cooperative.        Thought Content: Thought content is not paranoid or delusional. Thought content does not include homicidal or suicidal ideation.        Cognition and Memory: Cognition and memory normal.        Judgment: Judgment normal.   ROS Blood pressure 125/73, pulse 68, temperature 97.8 F (36.6 C), temperature source Oral, resp. rate 18, SpO2 93 %. There is no height or weight on file to calculate BMI.  Treatment Plan Summary: Daily contact with patient to assess and evaluate symptoms and progress in  treatment, Medication management, and Plan Inpatient psychiatric hospitalization recommended for safety and stabilization.   Home medications: Bupropion 300 mg daily Duloxetine 60 mg 2 times daily   START mirtazapine 7.5 mg daily at bedtime for sleep support, also beneficial for appetite stimulation.  Disposition: Recommend psychiatric Inpatient admission when medically cleared. Supportive therapy provided about ongoing stressors.  Ronny Flurry, NP 11/01/2022 5:42 PM

## 2022-11-02 LAB — CBG MONITORING, ED: Glucose-Capillary: 139 mg/dL — ABNORMAL HIGH (ref 70–99)

## 2022-11-02 MED ORDER — TRAZODONE HCL 100 MG PO TABS
100.0000 mg | ORAL_TABLET | Freq: Every day | ORAL | Status: DC
  Administered 2022-11-02: 100 mg via ORAL
  Filled 2022-11-02: qty 1

## 2022-11-02 MED ORDER — FLUTICASONE FUROATE-VILANTEROL 100-25 MCG/ACT IN AEPB
1.0000 | INHALATION_SPRAY | Freq: Every day | RESPIRATORY_TRACT | Status: DC
  Administered 2022-11-02: 1 via RESPIRATORY_TRACT
  Filled 2022-11-02: qty 28

## 2022-11-02 MED ORDER — UMECLIDINIUM BROMIDE 62.5 MCG/ACT IN AEPB
1.0000 | INHALATION_SPRAY | Freq: Every day | RESPIRATORY_TRACT | Status: DC
  Administered 2022-11-02: 1 via RESPIRATORY_TRACT
  Filled 2022-11-02: qty 7

## 2022-11-02 MED ORDER — PANTOPRAZOLE SODIUM 20 MG PO TBEC: 20.0000 mg | DELAYED_RELEASE_TABLET | Freq: Two times a day (BID) | ORAL | Status: DC

## 2022-11-02 MED ORDER — AMLODIPINE BESYLATE 5 MG PO TABS
5.0000 mg | ORAL_TABLET | Freq: Every day | ORAL | Status: DC
  Administered 2022-11-02: 5 mg via ORAL
  Filled 2022-11-02: qty 1

## 2022-11-02 MED ORDER — MELOXICAM 7.5 MG PO TABS
15.0000 mg | ORAL_TABLET | Freq: Every day | ORAL | Status: DC
  Administered 2022-11-02: 15 mg via ORAL
  Filled 2022-11-02: qty 2

## 2022-11-02 MED ORDER — FLUTICASONE PROPIONATE 50 MCG/ACT NA SUSP
2.0000 | Freq: Every day | NASAL | Status: DC
  Administered 2022-11-02: 2 via NASAL
  Filled 2022-11-02: qty 16

## 2022-11-02 MED ORDER — METOCLOPRAMIDE HCL 10 MG PO TABS: 10.0000 mg | ORAL_TABLET | Freq: Three times a day (TID) | ORAL | Status: DC | PRN

## 2022-11-02 MED ORDER — CEFDINIR 300 MG PO CAPS
300.0000 mg | ORAL_CAPSULE | Freq: Two times a day (BID) | ORAL | Status: DC
  Filled 2022-11-02: qty 1

## 2022-11-02 MED ORDER — NYSTATIN 100000 UNIT/ML MT SUSP
5.0000 mL | Freq: Four times a day (QID) | OROMUCOSAL | Status: DC
  Administered 2022-11-02: 500000 [IU] via ORAL
  Filled 2022-11-02: qty 5

## 2022-11-02 MED ORDER — METOPROLOL SUCCINATE ER 50 MG PO TB24
50.0000 mg | ORAL_TABLET | Freq: Every day | ORAL | Status: DC
  Administered 2022-11-02: 50 mg via ORAL
  Filled 2022-11-02: qty 1

## 2022-11-02 MED ORDER — LEVOTHYROXINE SODIUM 50 MCG PO TABS: 75.0000 ug | ORAL_TABLET | Freq: Every day | ORAL | Status: DC

## 2022-11-02 MED ORDER — VITAMIN D 25 MCG (1000 UNIT) PO TABS
2000.0000 [IU] | ORAL_TABLET | Freq: Every day | ORAL | Status: DC
  Administered 2022-11-02: 2000 [IU] via ORAL
  Filled 2022-11-02: qty 2

## 2022-11-02 MED ORDER — BENZONATATE 100 MG PO CAPS: 200.0000 mg | ORAL_CAPSULE | Freq: Three times a day (TID) | ORAL | Status: DC | PRN

## 2022-11-02 MED ORDER — ACETAMINOPHEN 500 MG PO TABS: 1000.0000 mg | ORAL_TABLET | Freq: Four times a day (QID) | ORAL | Status: DC | PRN

## 2022-11-02 MED ORDER — ROSUVASTATIN CALCIUM 10 MG PO TABS
10.0000 mg | ORAL_TABLET | Freq: Every day | ORAL | Status: DC
  Administered 2022-11-02: 10 mg via ORAL
  Filled 2022-11-02: qty 1

## 2022-11-02 MED ORDER — PRIMIDONE 50 MG PO TABS
25.0000 mg | ORAL_TABLET | Freq: Two times a day (BID) | ORAL | Status: DC
  Administered 2022-11-02: 25 mg via ORAL
  Filled 2022-11-02: qty 0.5

## 2022-11-02 MED ORDER — METFORMIN HCL ER 500 MG PO TB24
1000.0000 mg | ORAL_TABLET | Freq: Two times a day (BID) | ORAL | Status: DC
  Administered 2022-11-02: 1000 mg via ORAL
  Filled 2022-11-02: qty 2

## 2022-11-02 MED ORDER — NYSTATIN 100000 UNIT/GM EX POWD
1.0000 | Freq: Three times a day (TID) | CUTANEOUS | Status: DC
  Filled 2022-11-02: qty 15

## 2022-11-02 MED ORDER — IPRATROPIUM-ALBUTEROL 0.5-2.5 (3) MG/3ML IN SOLN: 3.0000 mL | Freq: Four times a day (QID) | RESPIRATORY_TRACT | Status: DC | PRN

## 2022-11-02 MED ORDER — MONTELUKAST SODIUM 10 MG PO TABS: 10.0000 mg | ORAL_TABLET | Freq: Every day | ORAL | Status: DC

## 2022-11-02 MED ORDER — OXYBUTYNIN CHLORIDE ER 5 MG PO TB24: 5.0000 mg | ORAL_TABLET | Freq: Every day | ORAL | Status: DC

## 2022-11-02 MED ORDER — THEOPHYLLINE ER 300 MG PO TB12
300.0000 mg | ORAL_TABLET | Freq: Every day | ORAL | Status: DC
  Administered 2022-11-02: 300 mg via ORAL
  Filled 2022-11-02: qty 1

## 2022-11-02 MED ORDER — ASPIRIN 81 MG PO CHEW
81.0000 mg | CHEWABLE_TABLET | Freq: Two times a day (BID) | ORAL | Status: DC
  Administered 2022-11-02: 81 mg via ORAL
  Filled 2022-11-02: qty 1

## 2022-11-02 MED ORDER — TRAMADOL HCL 50 MG PO TABS: 50.0000 mg | ORAL_TABLET | Freq: Two times a day (BID) | ORAL | Status: DC | PRN

## 2022-11-02 NOTE — Discharge Instructions (Addendum)
You were seen by psychiatry and they were looking for a place for you to be hospitalized and further managed for your psychiatric conditions, but you decided to go home at this time.  Please follow-up with your regular doctor and psychiatry as you have planned.  Come back to the emergency department if you have any new or worsening symptoms.

## 2022-11-02 NOTE — ED Notes (Signed)
Pt was requesting food, educated in rules of unit. Pt states that she is diabetic and not treated as needed. Sugar checked for patient. Communicated with Dr. Leonides Schanz, pharmacy contacted requesting staff for med review at this time.

## 2022-11-02 NOTE — Progress Notes (Unsigned)
I,Joseline E Rosas,acting as a scribe for [name], FNP.,have documented all relevant documentation on the behalf of [name], FNP,as directed by  [name], FNP while in the presence of [name], FNP.  Established patient visit  Patient: Linda Bradford   DOB: 02/01/1956   67 y.o. Female  MRN: [id number] Visit Date: 11/03/2022  Today's healthcare provider: [name], FNP  Re Introduced to nurse practitioner role and practice setting.  All questions answered.  Discussed provider/patient relationship and expectations.  Chief Complaint  Patient presents with   Follow-up   Subjective    HPI  Follow up ER visit  Patient was seen in ER for Hallucinations on 10/31/2022 and Altered Mental Status 11/01/2022. Treatment for this included Psychiatry and TTS have evaluation.They recommended admission to inpatient psychiatric unit. Patient decided to go home.  Wt Readings from Last 3 Encounters:  11/03/22 200 lb 1.6 oz (90.8 kg)  10/31/22 205 lb (93 kg)  10/28/22 207 lb 8 oz (94.1 kg)    Medications: Outpatient Medications Prior to Visit  Medication Sig   acetaminophen (TYLENOL) 500 MG tablet Take 1,000 mg by mouth every 6 (six) hours as needed for mild pain or moderate pain.    amLODipine (NORVASC) 5 MG tablet Take 1 tablet (5 mg total) by mouth daily.   aspirin 81 MG chewable tablet Chew 1 tablet (81 mg total) by mouth 2 (two) times daily.   benzonatate (TESSALON) 200 MG capsule TAKE 1 CAPSULE BY MOUTH 3 TIMES DAILY ASNEEDED (Patient taking differently: Take 200 mg by mouth 3 (three) times daily as needed for cough.)   blood glucose meter kit and supplies Dispense based on patient and insurance preference. Once daily. (FOR ICD-10 E10.9, E11.9).   buPROPion (WELLBUTRIN XL) 300 MG 24 hr tablet Take 1 tablet (300 mg total) by mouth daily.   Cholecalciferol (VITAMIN D) 50 MCG (2000 UT) tablet Take 2,000 Units by mouth daily.   diclofenac (VOLTAREN) 50 MG EC tablet     diclofenac Sodium (VOLTAREN) 1 % GEL    DULoxetine (CYMBALTA) 60 MG capsule Take 1 capsule (60 mg total) by mouth 2 (two) times daily.   fluticasone (FLONASE) 50 MCG/ACT nasal spray Place 2 sprays into both nostrils daily.   Fluticasone-Umeclidin-Vilant (TRELEGY ELLIPTA) 100-62.5-25 MCG/ACT AEPB Inhale 1 puff into the lungs daily.   glucose blood (CONTOUR NEXT TEST) test strip To check blood sugar once daily   ipratropium-albuterol (DUONEB) 0.5-2.5 (3) MG/3ML SOLN Take 3 mLs by nebulization every 6 (six) hours as needed.   levothyroxine (SYNTHROID) 75 MCG tablet Take 1 tablet (75 mcg total) by mouth daily before breakfast.   loperamide (IMODIUM A-D) 2 MG tablet Take 2-4 mg by mouth 4 (four) times daily as needed for diarrhea or loose stools.   meloxicam (MOBIC) 15 MG tablet Take 1 tablet (15 mg total) by mouth daily.   metFORMIN (GLUCOPHAGE-XR) 500 MG 24 hr tablet Take 2 tablets (1,000 mg total) by mouth 2 (two) times daily with a meal.   metoCLOPramide (REGLAN) 10 MG tablet Take 1 tablet (10 mg total) by mouth every 8 (eight) hours as needed for nausea.   metoprolol succinate (TOPROL-XL) 50 MG 24 hr tablet TAKE 1 TABLET BY MOUTH ONCE DAILY WITH OR IMMEDIATELY FOLLOWING A MEAL   montelukast (SINGULAIR) 10 MG tablet Take 1 tablet (10 mg total) by mouth at bedtime.   nystatin (MYCOSTATIN) 100000 UNIT/ML suspension Take 5 mLs (500,000 Units total) by mouth 4 (  four) times daily.   nystatin (MYCOSTATIN/NYSTOP) powder Apply 1 application topically 3 (three) times daily.   oxybutynin (DITROPAN-XL) 5 MG 24 hr tablet Take 1 tablet (5 mg total) by mouth at bedtime.   pantoprazole (PROTONIX) 20 MG tablet TAKE 1 TABLET(20 MG) BY MOUTH TWICE DAILY BEFORE A MEAL (Patient taking differently: Take 20 mg by mouth 2 (two) times daily before a meal. TAKE 1 TABLET(20 MG) BY MOUTH TWICE DAILY BEFORE A MEAL)   primidone (MYSOLINE) 50 MG tablet Take 0.5 tablets (25 mg total) by mouth 2 (two) times daily.   rosuvastatin  (CRESTOR) 10 MG tablet Take 1 tablet (10 mg total) by mouth daily.   sodium chloride 1 g tablet Take 1 tablet (1 g total) by mouth 2 (two) times daily with a meal.   theophylline (THEODUR) 300 MG 12 hr tablet Take 1 tablet (300 mg total) by mouth daily.   traMADol (ULTRAM) 50 MG tablet Take 50 mg by mouth 2 (two) times daily as needed for moderate pain.   [DISCONTINUED] gabapentin (NEURONTIN) 300 MG capsule Take 2 capsules PO every morning; Take 3 capsules PO every evening. (Patient taking differently: Take 600-900 mg by mouth 2 (two) times daily. Take 600 mg (two capsules) by mouth every morning; Take 900 mg (three capsules) every evening.)   No facility-administered medications prior to visit.    Review of Systems    Objective    BP 111/67 (BP Location: Left Arm, Patient Position: Sitting, Cuff Size: Normal)   Pulse 72   Temp 98.1 F (36.7 C) (Oral)   Resp 16   Wt 200 lb 1.6 oz (90.8 kg)   SpO2 97%   BMI 30.43 kg/m   Physical Exam Vitals and nursing note reviewed.  Constitutional:      General: She is not in acute distress.    Appearance: Normal appearance. She is obese. She is not ill-appearing, toxic-appearing or diaphoretic.  HENT:     Head: Normocephalic and atraumatic.  Cardiovascular:     Rate and Rhythm: Normal rate and regular rhythm.     Pulses: Normal pulses.     Heart sounds: Normal heart sounds. No murmur heard.    No friction rub. No gallop.  Pulmonary:     Effort: Pulmonary effort is normal. No respiratory distress.     Breath sounds: Normal breath sounds. No stridor. No wheezing, rhonchi or rales.  Chest:     Chest wall: No tenderness.  Musculoskeletal:        General: No swelling, tenderness, deformity or signs of injury. Normal range of motion.     Right lower leg: No edema.     Left lower leg: No edema.  Skin:    General: Skin is warm and dry.     Capillary Refill: Capillary refill takes less than 2 seconds.     Coloration: Skin is not jaundiced or  pale.     Findings: No bruising, erythema, lesion or rash.  Neurological:     General: No focal deficit present.     Mental Status: She is alert and oriented to person, place, and time. Mental status is at baseline.     Cranial Nerves: No cranial nerve deficit.     Sensory: No sensory deficit.     Motor: No weakness.     Coordination: Coordination normal.  Psychiatric:        Mood and Affect: Mood is anxious.        Behavior: Behavior is hyperactive.  Thought Content: Thought content normal. Thought content is not paranoid or delusional. Thought content does not include homicidal or suicidal ideation. Thought content does not include homicidal or suicidal plan.        Judgment: Judgment normal.     Comments: Bouncing legs up and down     No results found for any visits on 11/03/22.  Assessment & Plan     Problem List Items Addressed This Visit       Endocrine   Diabetes mellitus type 2 with neurological manifestations    Chronic, stable Has not been able to fill gabapentin d/t shortage at local pharmacy; will send not another pharmacy to assist       Relevant Medications   gabapentin (NEURONTIN) 300 MG capsule     Other   Hallucinations    Acute, improving Previous negative workup at ER x 3 visits Recommend psych hold; patient refused. Encourage psych care today- [facility] or [place] options- numbers provided Encourage regular meals and sleep patterns; reduce tobacco to assist       Relevant Orders   Theophylline level   Ambulatory referral to Psychiatry   Ambulatory referral to Psychology   Prolonged grief disorder - Primary    Patient request for assistance following passing of her husband in February 2024      Relevant Orders   Ambulatory referral to Psychiatry   Ambulatory referral to Psychology   Wandering associated with mental disorder    Patient notes tremors and; recommend use of atarax as well as trazodone at this time with close follow  up      Return in about 2 weeks (around 11/17/2022) for anxiety and depression.     Vonna Kotyk, FNP, have reviewed all documentation for this visit. The documentation on 11/03/22 for the exam, diagnosis, procedures, and orders are all accurate and complete.  [name], [place] [phone] (phone) [phone] (fax)  [place]

## 2022-11-02 NOTE — ED Notes (Signed)
Pt demanding to be placed in a facility locally. Pt refusing to go to location [facility]. Pt states "You have until 12 oclock or I'm leaving" to psych team at bedside.

## 2022-11-02 NOTE — ED Notes (Signed)
Pt has moved to a recliner in hallway due to comfort in bed. Pt has had challenge through entire shift with the comfort of the bed. Pt educated that this is temporary and needs for space for patients is priority. Pt expresses understanding.

## 2022-11-02 NOTE — Consult Note (Signed)
This writer accompanied by TTS talked to the patient as requested.  The patient presented voluntarily to the emergency department and is now requesting to go home, expressing a desire to follow-up with her primary care provider for outpatient therapy.  Despite being advised of the benefits of inpatient treatment, the patient declined and refused to sign a voluntary consent form.  She has capacity and is clear and coherent, denying any suicidal ideation or hallucinations.  The patient has been given return precautions and plans to follow-up with outpatient therapy as referred by her primary care provider.  Despite reluctance for inpatient treatment, the patient is deemed capable of decision making, and there are no safety concerns, leading to the decision not to keep her against her will. Plan reviewed with Dr. Jacelyn Grip, [place].   Loreto Loescher H. Richardson Landry, NP [id number]/[phone]

## 2022-11-02 NOTE — ED Provider Notes (Signed)
This patient is here voluntarily, seen by psychiatry team who is looking into placement for the patient.  She now wants to go home states that she does not want to stay in the hospital any further, denies suicidality, has a plan to see her primary doctor and outpatient psychiatry.  Return precautions given.  She is here voluntarily and I do not see a reason to keep her against her will as this patient has decision-making capacity    Lucillie Garfinkel, MD 11/02/22 1128

## 2022-11-02 NOTE — ED Notes (Signed)
Pt declines voluntary admission to [facility] at [facility]. Pt given belongings bag 1/1 at time of discharge. E-signature not working at this time. Pt verbalized understanding of D/C instructions, prescriptions and follow up care with no further questions at this time. Pt in NAD and ambulatory at time of D/C.

## 2022-11-02 NOTE — BH Assessment (Signed)
Writer and Psych NP spoke with patient as requested. Patient states she no longer wants inpatient treatment and would prefer outpatient therapy with referral from her PCP. Patient was advised that inpatient treatment would be beneficial for current wellbeing due to life stressors. Patient was reluctant however, stating [place] and [place] by noon, she would go. Psych Team was able to speak with [facility] and Lorrin Goodell Supervisor who provided bed acceptance and admission. Patient refused to sign voluntary consent form and was adamant about leaving. Patient is coherent and speaking in linear sentences. There are no safety concerns. Patient denies SI/HI/AH/VH.

## 2022-11-03 ENCOUNTER — Ambulatory Visit (INDEPENDENT_AMBULATORY_CARE_PROVIDER_SITE_OTHER): Payer: Medicare PPO | Admitting: Family Medicine

## 2022-11-03 ENCOUNTER — Encounter: Payer: Self-pay | Admitting: Family Medicine

## 2022-11-03 ENCOUNTER — Other Ambulatory Visit: Payer: Self-pay | Admitting: Family Medicine

## 2022-11-03 VITALS — BP 111/67 | HR 72 | Temp 98.1°F | Resp 16 | Wt 200.1 lb

## 2022-11-03 DIAGNOSIS — F4381 Prolonged grief disorder: Secondary | ICD-10-CM | POA: Diagnosis not present

## 2022-11-03 DIAGNOSIS — Z9183 Wandering in diseases classified elsewhere: Secondary | ICD-10-CM

## 2022-11-03 DIAGNOSIS — R443 Hallucinations, unspecified: Secondary | ICD-10-CM | POA: Diagnosis not present

## 2022-11-03 DIAGNOSIS — E1149 Type 2 diabetes mellitus with other diabetic neurological complication: Secondary | ICD-10-CM

## 2022-11-03 MED ORDER — TRAZODONE HCL 100 MG PO TABS
100.0000 mg | ORAL_TABLET | Freq: Two times a day (BID) | ORAL | 1 refills | Status: DC
Start: 2022-11-03 — End: 2023-01-18

## 2022-11-03 MED ORDER — HYDROXYZINE PAMOATE 25 MG PO CAPS
25.0000 mg | ORAL_CAPSULE | Freq: Three times a day (TID) | ORAL | 0 refills | Status: DC | PRN
Start: 2022-11-03 — End: 2023-01-18

## 2022-11-03 MED ORDER — GABAPENTIN 300 MG PO CAPS: 300.0000 mg | ORAL_CAPSULE | Freq: Two times a day (BID) | ORAL | 1 refills | Status: DC

## 2022-11-03 NOTE — Assessment & Plan Note (Signed)
Chronic, stable Has not been able to fill gabapentin d/t shortage at local pharmacy; will send not another pharmacy to assist

## 2022-11-03 NOTE — Assessment & Plan Note (Signed)
Patient notes tremors and; recommend use of atarax as well as trazodone at this time with close follow up

## 2022-11-03 NOTE — Patient Instructions (Addendum)
Please proceed to [facility], [place] [address] [phone]  Jamey Reas Call our 24-hour HelpLine at [phone] or [phone] for immediate assistance for mental health and substance abuse issues.

## 2022-11-03 NOTE — Assessment & Plan Note (Signed)
Acute, improving Previous negative workup at ER x 3 visits Recommend psych hold; patient refused. Encourage psych care today- [facility] or [facility] options- numbers provided Encourage regular meals and sleep patterns; reduce tobacco to assist

## 2022-11-03 NOTE — Assessment & Plan Note (Signed)
Patient request for assistance following passing of her husband in February 2024

## 2022-11-03 NOTE — Telephone Encounter (Signed)
Requested medication (s) are due for refill today: yes  Requested medication (s) are on the active medication list: yes  Last refill:  11/03/22  Future visit scheduled: yes  Notes to clinic:  Pharmacy comment: Alternative Requested:NEED PA PLEASE.      Requested Prescriptions  Pending Prescriptions Disp Refills   hydrOXYzine (VISTARIL) 25 MG capsule [Pharmacy Med Name: HYDROXYZINE PAM 25 MG CAP] 30 capsule 0    Sig: Take 1 capsule (25 mg total) by mouth every 8 (eight) hours as needed.     Ear, Nose, and Throat:  Antihistamines 2 Passed - 11/03/2022  2:05 PM      Passed - Cr in normal range and within 360 days    Creat  Date Value Ref Range Status  05/19/2017 0.58 0.50 - 0.99 mg/dL Final    Comment:    For patients >29 years of age, the reference limit for Creatinine is approximately 13% higher for people identified as African-American. .    Creatinine, Ser  Date Value Ref Range Status  11/01/2022 0.62 0.44 - 1.00 mg/dL Final         Passed - Valid encounter within last 12 months    Recent Outpatient Visits           Today Prolonged grief disorder   [facility] [name], FNP   3 days ago Hallucinations   [place] Tally Joe T, FNP   2 weeks ago Hypertension associated with type 2 diabetes mellitus [facility])   [place] [name], FNP   4 weeks ago Breast pain, left   [facility] Tally Joe T, FNP   2 months ago Hypertension associated with type 2 diabetes mellitus [facility])   [place] [name], FNP

## 2022-11-05 LAB — CULTURE, BLOOD (ROUTINE X 2)
Culture: NO GROWTH
Special Requests: ADEQUATE

## 2022-11-05 LAB — THEOPHYLLINE LEVEL: Theophylline, Serum: 1.3 ug/mL — ABNORMAL LOW (ref 10.0–20.0)

## 2022-11-07 NOTE — Progress Notes (Signed)
Recommend discontinuation of medication, theophylline. We will adjust levothyroxine to assist as needed

## 2022-11-10 LAB — MISC LABCORP TEST (SEND OUT): Labcorp test code: 8680

## 2022-12-07 LAB — ORGANISM ID BY SEQUENCING

## 2022-12-07 LAB — AEROBIC BACTERIA, ID BY SEQ.

## 2022-12-09 LAB — CULTURE, BLOOD (ROUTINE X 2)

## 2022-12-09 LAB — SUSCEPTIBILITY RESULT

## 2022-12-18 ENCOUNTER — Other Ambulatory Visit: Payer: Self-pay | Admitting: Family Medicine

## 2022-12-18 DIAGNOSIS — E1159 Type 2 diabetes mellitus with other circulatory complications: Secondary | ICD-10-CM

## 2022-12-22 ENCOUNTER — Ambulatory Visit
Admission: RE | Admit: 2022-12-22 | Discharge: 2022-12-22 | Disposition: A | Discharge status: Home / Self Care | Payer: Medicare PPO | Source: Ambulatory Visit | Attending: Acute Care | Admitting: Acute Care

## 2022-12-22 DIAGNOSIS — Z122 Encounter for screening for malignant neoplasm of respiratory organs: Secondary | ICD-10-CM | POA: Insufficient documentation

## 2022-12-22 DIAGNOSIS — F17210 Nicotine dependence, cigarettes, uncomplicated: Secondary | ICD-10-CM | POA: Insufficient documentation

## 2022-12-22 DIAGNOSIS — Z87891 Personal history of nicotine dependence: Secondary | ICD-10-CM | POA: Insufficient documentation

## 2022-12-30 ENCOUNTER — Other Ambulatory Visit: Payer: Self-pay

## 2022-12-30 ENCOUNTER — Telehealth: Payer: Self-pay | Admitting: Acute Care

## 2022-12-30 ENCOUNTER — Other Ambulatory Visit: Payer: Self-pay | Admitting: Family Medicine

## 2022-12-30 DIAGNOSIS — R911 Solitary pulmonary nodule: Secondary | ICD-10-CM

## 2022-12-30 DIAGNOSIS — F17210 Nicotine dependence, cigarettes, uncomplicated: Secondary | ICD-10-CM

## 2022-12-30 DIAGNOSIS — I359 Nonrheumatic aortic valve disorder, unspecified: Secondary | ICD-10-CM

## 2022-12-30 NOTE — Telephone Encounter (Signed)
Spoke with patient by phone to review results of LDCT.  Mild emphysema and atherosclerosis noted.  Some calcifications on the aortic valve.  New right lung nodule with recommendation for follow up in 6 months, as precaution.  Patient has multiple nodules noted in comparing to previous CT 2023. Likely benign findings.  Patient states she 'always has congestion in chest' and states she has a history of chronic bronchitis. Denies recent infection.  Patient is on statin medication and has previously seen cardiology and had echo several years ago.  Not under cardiology care at present time. Results routed to PCP with plan for repeat LDCT 6 months.  Also, added note to PCP to review calcifications of aortic valve and make recommendation to patient if echo is needed.  Patient will follow up PCP regarding her recommendations.  Order placed for repeat LDCT 6 months

## 2023-01-03 ENCOUNTER — Other Ambulatory Visit: Payer: Self-pay

## 2023-01-03 DIAGNOSIS — R0602 Shortness of breath: Secondary | ICD-10-CM

## 2023-01-17 ENCOUNTER — Telehealth: Payer: Self-pay | Admitting: Family Medicine

## 2023-01-17 NOTE — Telephone Encounter (Signed)
The patient called in stating somehow she has lost her metoprolol succinate (TOPROL-XL) 50 MG 24 hr tablet and is also having issues with her mail order pharmacy [facility]. She is needing a refill if possible and has an appt tomorrow with her provider to discuss further as well. Please assist patient further.

## 2023-01-17 NOTE — Telephone Encounter (Signed)
Please advise 

## 2023-01-18 ENCOUNTER — Other Ambulatory Visit: Payer: Self-pay | Admitting: Family Medicine

## 2023-01-18 ENCOUNTER — Encounter: Payer: Self-pay | Admitting: Family Medicine

## 2023-01-18 ENCOUNTER — Ambulatory Visit: Payer: Medicare PPO | Admitting: Family Medicine

## 2023-01-18 VITALS — BP 108/51 | HR 82 | Wt 208.5 lb

## 2023-01-18 DIAGNOSIS — I700 Atherosclerosis of aorta: Secondary | ICD-10-CM | POA: Diagnosis not present

## 2023-01-18 DIAGNOSIS — Z1211 Encounter for screening for malignant neoplasm of colon: Secondary | ICD-10-CM | POA: Insufficient documentation

## 2023-01-18 DIAGNOSIS — J432 Centrilobular emphysema: Secondary | ICD-10-CM

## 2023-01-18 DIAGNOSIS — E871 Hypo-osmolality and hyponatremia: Secondary | ICD-10-CM

## 2023-01-18 DIAGNOSIS — E039 Hypothyroidism, unspecified: Secondary | ICD-10-CM

## 2023-01-18 DIAGNOSIS — G250 Essential tremor: Secondary | ICD-10-CM

## 2023-01-18 DIAGNOSIS — Z720 Tobacco use: Secondary | ICD-10-CM

## 2023-01-18 DIAGNOSIS — M51379 Other intervertebral disc degeneration, lumbosacral region without mention of lumbar back pain or lower extremity pain: Secondary | ICD-10-CM | POA: Insufficient documentation

## 2023-01-18 DIAGNOSIS — I10 Essential (primary) hypertension: Secondary | ICD-10-CM

## 2023-01-18 DIAGNOSIS — I152 Hypertension secondary to endocrine disorders: Secondary | ICD-10-CM

## 2023-01-18 DIAGNOSIS — M5137 Other intervertebral disc degeneration, lumbosacral region: Secondary | ICD-10-CM

## 2023-01-18 DIAGNOSIS — E1159 Type 2 diabetes mellitus with other circulatory complications: Secondary | ICD-10-CM

## 2023-01-18 DIAGNOSIS — E1149 Type 2 diabetes mellitus with other diabetic neurological complication: Secondary | ICD-10-CM

## 2023-01-18 DIAGNOSIS — E1169 Type 2 diabetes mellitus with other specified complication: Secondary | ICD-10-CM

## 2023-01-18 DIAGNOSIS — I359 Nonrheumatic aortic valve disorder, unspecified: Secondary | ICD-10-CM

## 2023-01-18 MED ORDER — METOPROLOL SUCCINATE ER 50 MG PO TB24: 50.0000 mg | ORAL_TABLET | Freq: Every day | ORAL | 3 refills | Status: DC

## 2023-01-18 MED ORDER — METOPROLOL SUCCINATE ER 50 MG PO TB24: 50.0000 mg | ORAL_TABLET | Freq: Every day | ORAL | 0 refills | Status: DC

## 2023-01-18 NOTE — Assessment & Plan Note (Signed)
Chronic, stable Continue to recommend exercise, inflammation improvement and tylenol to assist 1000 mg TID PRN

## 2023-01-18 NOTE — Assessment & Plan Note (Signed)
Pt reported; variable times Unclear cause Previous work up including grief vs poly pharmacy Upcoming appt with neurology Check Vitamin levels at this time

## 2023-01-18 NOTE — Progress Notes (Signed)
I,Vanessa  Vital,acting as a Neurosurgeon for Jacky Kindle, FNP.,have documented all relevant documentation on the behalf of Jacky Kindle, FNP,as directed by  Jacky Kindle, FNP while in the presence of Jacky Kindle, FNP.  Established patient visit  Patient: Linda Bradford   DOB: 03/29/1956   67 y.o. Female  MRN: [id number] Visit Date: 01/18/2023  Today's healthcare provider: Jacky Kindle, FNP   Re Introduced to nurse practitioner role and practice setting.  All questions answered.  Discussed provider/patient relationship and expectations.  Subjective    HPI   Medications: Outpatient Medications Prior to Visit  Medication Sig   acetaminophen (TYLENOL) 500 MG tablet Take 1,000 mg by mouth every 6 (six) hours as needed for mild pain or moderate pain.    amLODipine (NORVASC) 5 MG tablet TAKE 1 TABLET(5 MG) BY MOUTH DAILY   aspirin 81 MG chewable tablet Chew 1 tablet (81 mg total) by mouth 2 (two) times daily.   blood glucose meter kit and supplies Dispense based on patient and insurance preference. Once daily. (FOR ICD-10 E10.9, E11.9).   buPROPion (WELLBUTRIN XL) 300 MG 24 hr tablet Take 1 tablet (300 mg total) by mouth daily.   Cholecalciferol (VITAMIN D) 50 MCG (2000 UT) tablet Take 2,000 Units by mouth daily.   diclofenac (VOLTAREN) 50 MG EC tablet    diclofenac Sodium (VOLTAREN) 1 % GEL    DULoxetine (CYMBALTA) 60 MG capsule Take 1 capsule (60 mg total) by mouth 2 (two) times daily.   fluticasone (FLONASE) 50 MCG/ACT nasal spray Place 2 sprays into both nostrils daily.   Fluticasone-Umeclidin-Vilant (TRELEGY ELLIPTA) 100-62.5-25 MCG/ACT AEPB Inhale 1 puff into the lungs daily.   gabapentin (NEURONTIN) 300 MG capsule Take 1 capsule (300 mg total) by mouth 2 (two) times daily. Take 2 capsules PO every morning; Take 3 capsules PO every evening.   glucose blood (CONTOUR NEXT TEST) test strip To check blood sugar once daily   ipratropium-albuterol (DUONEB) 0.5-2.5 (3) MG/3ML SOLN Take 3 mLs  by nebulization every 6 (six) hours as needed.   levothyroxine (SYNTHROID) 75 MCG tablet Take 1 tablet (75 mcg total) by mouth daily before breakfast.   loperamide (IMODIUM A-D) 2 MG tablet Take 2-4 mg by mouth 4 (four) times daily as needed for diarrhea or loose stools.   meloxicam (MOBIC) 15 MG tablet Take 1 tablet (15 mg total) by mouth daily.   metFORMIN (GLUCOPHAGE-XR) 500 MG 24 hr tablet Take 2 tablets (1,000 mg total) by mouth 2 (two) times daily with a meal.   metoCLOPramide (REGLAN) 10 MG tablet Take 1 tablet (10 mg total) by mouth every 8 (eight) hours as needed for nausea.   metoprolol succinate (TOPROL-XL) 50 MG 24 hr tablet Take 1 tablet (50 mg total) by mouth daily.   metoprolol succinate (TOPROL-XL) 50 MG 24 hr tablet Take 1 tablet (50 mg total) by mouth daily. Take with or immediately following a meal.   montelukast (SINGULAIR) 10 MG tablet Take 1 tablet (10 mg total) by mouth at bedtime.   nystatin (MYCOSTATIN) 100000 UNIT/ML suspension Take 5 mLs (500,000 Units total) by mouth 4 (four) times daily.   nystatin (MYCOSTATIN/NYSTOP) powder Apply 1 application topically 3 (three) times daily.   oxybutynin (DITROPAN-XL) 5 MG 24 hr tablet Take 1 tablet (5 mg total) by mouth at bedtime.   pantoprazole (PROTONIX) 20 MG tablet TAKE 1 TABLET(20 MG) BY MOUTH TWICE DAILY BEFORE A MEAL (Patient taking differently: Take 20 mg by  mouth 2 (two) times daily before a meal. TAKE 1 TABLET(20 MG) BY MOUTH TWICE DAILY BEFORE A MEAL)   primidone (MYSOLINE) 50 MG tablet Take 0.5 tablets (25 mg total) by mouth 2 (two) times daily.   rosuvastatin (CRESTOR) 10 MG tablet Take 1 tablet (10 mg total) by mouth daily.   traMADol (ULTRAM) 50 MG tablet Take 50 mg by mouth 2 (two) times daily as needed for moderate pain.   [DISCONTINUED] benzonatate (TESSALON) 200 MG capsule TAKE 1 CAPSULE BY MOUTH 3 TIMES DAILY ASNEEDED (Patient taking differently: Take 200 mg by mouth 3 (three) times daily as needed for cough.)    [DISCONTINUED] sodium chloride 1 g tablet Take 1 tablet (1 g total) by mouth 2 (two) times daily with a meal.   [DISCONTINUED] theophylline (THEODUR) 300 MG 12 hr tablet Take 1 tablet (300 mg total) by mouth daily.   [DISCONTINUED] hydrOXYzine (VISTARIL) 25 MG capsule Take 1 capsule (25 mg total) by mouth every 8 (eight) hours as needed. (Patient not taking: Reported on 01/18/2023)   [DISCONTINUED] traZODone (DESYREL) 100 MG tablet Take 1 tablet (100 mg total) by mouth 2 (two) times daily. (Patient not taking: Reported on 01/18/2023)   No facility-administered medications prior to visit.    Review of Systems  All other systems reviewed and are negative.    Objective    BP (!) 108/51 (BP Location: Left Arm, Patient Position: Sitting, Cuff Size: Large)   Pulse 82   Wt 208 lb 8 oz (94.6 kg)   SpO2 96%   BMI 31.70 kg/m   Physical Exam Vitals and nursing note reviewed.  Constitutional:      General: She is not in acute distress.    Appearance: Normal appearance. She is obese. She is not ill-appearing, toxic-appearing or diaphoretic.  HENT:     Head: Normocephalic and atraumatic.  Cardiovascular:     Rate and Rhythm: Normal rate and regular rhythm.     Pulses: Normal pulses.     Heart sounds: Normal heart sounds. No murmur heard.    No friction rub. No gallop.  Pulmonary:     Effort: Pulmonary effort is normal. No respiratory distress.     Breath sounds: Normal breath sounds. No stridor. No wheezing, rhonchi or rales.  Chest:     Chest wall: No tenderness.  Musculoskeletal:        General: No swelling, tenderness, deformity or signs of injury. Normal range of motion.     Right lower leg: No edema.     Left lower leg: No edema.  Skin:    General: Skin is warm and dry.     Capillary Refill: Capillary refill takes less than 2 seconds.     Coloration: Skin is not jaundiced or pale.     Findings: No bruising, erythema, lesion or rash.  Neurological:     General: No focal deficit  present.     Mental Status: She is alert and oriented to person, place, and time. Mental status is at baseline.     Cranial Nerves: No cranial nerve deficit.     Sensory: No sensory deficit.     Motor: No weakness.     Coordination: Coordination normal.     Comments: Pt reports "tremor" not noted on examination or movement within the room  Psychiatric:        Mood and Affect: Mood normal.        Behavior: Behavior normal.        Thought Content:  Thought content normal.        Judgment: Judgment normal.     No results found for any visits on 01/18/23.  Assessment & Plan     Problem List Items Addressed This Visit       Cardiovascular and Mediastinum   Aortic atherosclerosis (HCC)    Chronic, worsening given ongoing tobacco use Continue to recommend high dose statin and diet/exercise as able      Relevant Orders   US AORTA   Aortic valve calcification    Recommendation for ECHO; pt reports she has upcoming appt with cardiology On statin medication at this time Due for f/u with cardiology Continue to work on behavioral modification and symptom management       Relevant Orders   ECHOCARDIOGRAM COMPLETE   RESOLVED: Essential hypertension   Hypertension associated with type 2 diabetes mellitus (HCC)    Chronic, stable Continue to monitor for variable blood pressures in setting of medication adherence  Goal 129/79      Relevant Orders   ECHOCARDIOGRAM COMPLETE   TSH     Respiratory   Centrilobular emphysema (HCC)    Normal work of breathing; no adventitious lung sounds Samples provided of trelegy to assist with compliance Followed by pulmonary and is a research study      Relevant Orders   Pulmonary function test     Endocrine   Adult hypothyroidism    Chronic, previously stable on 75 mcg; however, endorses tremors Repeat labs at this time      Relevant Orders   TSH   Diabetes mellitus type 2 with neurological manifestations (HCC) - Primary    Chronic,  stable Repeat A1c Continue to recommend balanced, lower carb meals. Smaller meal size, adding snacks. Choosing water as drink of choice and increasing purposeful exercise. Unclear if pt reported tremor is s/s high/low BG or other co-morbid conditions Remains on metformin 2000 mg BID       Relevant Orders   Vitamin B1   Vitamin B6   B12 and Folate Panel   Vitamin D (25 hydroxy)   Hemoglobin A1c   Hyperlipidemia associated with type 2 diabetes mellitus (HCC)    Chronic, previously borderline Goal of 55-70; encourage FLP to futher assist recommend diet low in saturated fat and regular exercise - 30 min at least 5 times per week continues at 10 mg crestor        Nervous and Auditory   Essential tremor    Pt reported; variable times Unclear cause Previous work up including grief vs poly pharmacy Upcoming appt with neurology Check Vitamin levels at this time      Relevant Orders   CBC with Differential/Platelet   Comprehensive Metabolic Panel (CMET)   Vitamin B1   Vitamin B6   B12 and Folate Panel   TSH   Vitamin D (25 hydroxy)     Musculoskeletal and Integument   DDD (degenerative disc disease), lumbosacral    Chronic, stable Continue to recommend exercise, inflammation improvement and tylenol to assist 1000 mg TID PRN        Other   Colon cancer screening    3 year schedule; due for screening      Relevant Orders   Ambulatory referral to Gastroenterology   Current tobacco use    Chronic, unchanged remains pre-contemplative regarding cessation efforts at this time Continue to monitor Followed by pulm Request for referral for PFTs      Relevant Orders   Pulmonary function test  Hyponatremia    Chronic, unknown cause outside of COPD Repeat BMP Defer salt tabs at this time given labile HTN      Relevant Orders   Vitamin B1   Vitamin B6   B12 and Folate Panel   Return in about 3 months (around 04/20/2023) for chonic disease management.     Leilani Merl, FNP, have reviewed all documentation for this visit. The documentation on 01/18/23 for the exam, diagnosis, procedures, and orders are all accurate and complete.  Jacky Kindle, FNP  [facility] [phone] (phone) [phone] (fax)  [facility]

## 2023-01-18 NOTE — Assessment & Plan Note (Signed)
Recommendation for ECHO; pt reports she has upcoming appt with cardiology On statin medication at this time Due for f/u with cardiology Continue to work on behavioral modification and symptom management

## 2023-01-18 NOTE — Assessment & Plan Note (Signed)
Chronic, previously stable on 75 mcg; however, endorses tremors Repeat labs at this time

## 2023-01-18 NOTE — Assessment & Plan Note (Signed)
Chronic, stable Continue to monitor for variable blood pressures in setting of medication adherence  Goal 129/79

## 2023-01-18 NOTE — Assessment & Plan Note (Signed)
Chronic, unchanged remains pre-contemplative regarding cessation efforts at this time Continue to monitor Followed by pulm Request for referral for PFTs

## 2023-01-18 NOTE — Telephone Encounter (Signed)
Unable to refill per protocol, Rx request was refilled 01/18/23 for 90 days.E-Prescribing Status: Receipt confirmed by pharmacy (01/18/2023  9:16 AM EDT)   Requested Prescriptions  Pending Prescriptions Disp Refills   metoprolol succinate (TOPROL-XL) 50 MG 24 hr tablet [Pharmacy Med Name: METOPROLOL ER SUCCINATE 50MG  TABS] 90 tablet     Sig: TAKE 1 TABLET(50 MG) BY MOUTH DAILY WITH OR IMMEDIATELY FOLLOWING A MEAL     Cardiovascular:  Beta Blockers Passed - 01/18/2023  9:21 AM      Passed - Last BP in normal range    BP Readings from Last 1 Encounters:  11/03/22 111/67         Passed - Last Heart Rate in normal range    Pulse Readings from Last 1 Encounters:  11/03/22 72         Passed - Valid encounter within last 6 months    Recent Outpatient Visits           2 months ago Prolonged grief disorder   [facility] Jacky Kindle, FNP   2 months ago Hallucinations   [facility] Merita Norton T, FNP   3 months ago Hypertension associated with type 2 diabetes mellitus [facility])   [place] [facility] Merita Norton T, FNP   3 months ago Breast pain, left   [facility] Merita Norton T, FNP   4 months ago Hypertension associated with type 2 diabetes mellitus [facility])   [facility] Jacky Kindle, FNP       Future Appointments             Today Jacky Kindle, FNP [facility]   In 6 days Raechel Chute, MD [facility] at [place]   In 2 months Gollan, Tollie Pizza, MD [facility]

## 2023-01-18 NOTE — Assessment & Plan Note (Signed)
Chronic, previously borderline Goal of 55-70; encourage FLP to futher assist recommend diet low in saturated fat and regular exercise - 30 min at least 5 times per week continues at 10 mg crestor

## 2023-01-18 NOTE — Assessment & Plan Note (Signed)
Chronic, unknown cause outside of COPD Repeat BMP Defer salt tabs at this time given labile HTN

## 2023-01-18 NOTE — Addendum Note (Signed)
Addended by: Merita Norton T on: 01/18/2023 03:34 PM   Modules accepted: Level of Service

## 2023-01-18 NOTE — Assessment & Plan Note (Signed)
Normal work of breathing; no adventitious lung sounds Samples provided of trelegy to assist with compliance Followed by pulmonary and is a research study

## 2023-01-18 NOTE — Assessment & Plan Note (Signed)
Chronic, worsening given ongoing tobacco use Continue to recommend high dose statin and diet/exercise as able

## 2023-01-18 NOTE — Assessment & Plan Note (Signed)
3 year schedule; due for screening

## 2023-01-18 NOTE — Assessment & Plan Note (Signed)
Chronic, stable Repeat A1c Continue to recommend balanced, lower carb meals. Smaller meal size, adding snacks. Choosing water as drink of choice and increasing purposeful exercise. Unclear if pt reported tremor is s/s high/low BG or other co-morbid conditions Remains on metformin 2000 mg BID

## 2023-01-18 NOTE — Patient Instructions (Signed)
The CDC recommends two doses of Shingrix (the new shingles vaccine) separated by 2 to 6 months for adults age 67 years and older. I recommend checking with your insurance plan regarding coverage for this vaccine.    

## 2023-01-19 ENCOUNTER — Ambulatory Visit: Payer: Medicare PPO | Attending: Student in an Organized Health Care Education/Training Program

## 2023-01-19 NOTE — Progress Notes (Signed)
A1c remains stable, slight increase; Continue to recommend balanced, lower carb meals. Smaller meal size, adding snacks. Choosing water as drink of choice and increasing purposeful exercise.  Vitamins for tingling/numbness pending.

## 2023-01-22 NOTE — Progress Notes (Signed)
B6 remains pending; all others are normal/improved. Continue Vit D supplement. 2000-5000 IU Vit D3 available OTC

## 2023-01-23 ENCOUNTER — Other Ambulatory Visit: Payer: Self-pay | Admitting: Family Medicine

## 2023-01-23 DIAGNOSIS — Z9109 Other allergy status, other than to drugs and biological substances: Secondary | ICD-10-CM

## 2023-01-24 ENCOUNTER — Ambulatory Visit: Payer: Medicare PPO | Admitting: Student in an Organized Health Care Education/Training Program

## 2023-01-24 NOTE — Telephone Encounter (Signed)
Requested Prescriptions  Pending Prescriptions Disp Refills   fluticasone (FLONASE) 50 MCG/ACT nasal spray [Pharmacy Med Name: FLUTICASONE PROPIONATE 50 MCG/ACT Suspension] 48 g 1    Sig: USE 2 SPRAYS INTO BOTH NOSTRILS DAILY.     Ear, Nose, and Throat: Nasal Preparations - Corticosteroids Passed - 01/23/2023  1:58 AM      Passed - Valid encounter within last 12 months    Recent Outpatient Visits           6 days ago Diabetes mellitus type 2 with neurological manifestations [facility])   [facility] Merita Norton T, FNP   2 months ago Prolonged grief disorder   [facility] Jacky Kindle, FNP   2 months ago Hallucinations   [facility] Merita Norton T, FNP   3 months ago Hypertension associated with type 2 diabetes mellitus [facility])   [place] Merita Norton T, FNP   3 months ago Breast pain, left   [place] [facility] Jacky Kindle, FNP       Future Appointments             In 2 months Gollan, Tollie Pizza, MD [facility] at [facility]

## 2023-01-25 ENCOUNTER — Ambulatory Visit: Admission: RE | Admit: 2023-01-25 | Payer: Medicare PPO | Source: Ambulatory Visit

## 2023-01-25 LAB — B12 AND FOLATE PANEL
Folate: 10.4 ng/mL (ref >3.0)
Vitamin B-12: 364 pg/mL (ref 232–1245)

## 2023-01-25 LAB — COMPREHENSIVE METABOLIC PANEL
ALT: 18 IU/L (ref 0–32)
AST: 17 IU/L (ref 0–40)
Albumin: 4.4 g/dL (ref 3.9–4.9)
Alkaline Phosphatase: 118 IU/L (ref 44–121)
BUN/Creatinine Ratio: 16 (ref 12–28)
BUN: 11 mg/dL (ref 8–27)
Bilirubin Total: 0.3 mg/dL (ref 0.0–1.2)
CO2: 23 mmol/L (ref 20–29)
Calcium: 9.6 mg/dL (ref 8.7–10.3)
Chloride: 101 mmol/L (ref 96–106)
Creatinine, Ser: 0.68 mg/dL (ref 0.57–1.00)
Globulin, Total: 2.4 g/dL (ref 1.5–4.5)
Glucose: 119 mg/dL — ABNORMAL HIGH (ref 70–99)
Potassium: 4.3 mmol/L (ref 3.5–5.2)
Sodium: 140 mmol/L (ref 134–144)
Total Protein: 6.8 g/dL (ref 6.0–8.5)
eGFR: 95 mL/min/{1.73_m2} (ref >59)

## 2023-01-25 LAB — CBC WITH DIFFERENTIAL/PLATELET
Basophils Absolute: 0.1 10*3/uL (ref 0.0–0.2)
Basos: 1 %
EOS (ABSOLUTE): 0.2 10*3/uL (ref 0.0–0.4)
Eos: 4 %
Hematocrit: 40.8 % (ref 34.0–46.6)
Hemoglobin: 14 g/dL (ref 11.1–15.9)
Immature Grans (Abs): 0 10*3/uL (ref 0.0–0.1)
Immature Granulocytes: 0 %
Lymphocytes Absolute: 1.5 10*3/uL (ref 0.7–3.1)
Lymphs: 27 %
MCH: 30.7 pg (ref 26.6–33.0)
MCHC: 34.3 g/dL (ref 31.5–35.7)
MCV: 90 fL (ref 79–97)
Monocytes Absolute: 0.6 10*3/uL (ref 0.1–0.9)
Monocytes: 10 %
Neutrophils Absolute: 3.3 10*3/uL (ref 1.4–7.0)
Neutrophils: 58 %
Platelets: 288 10*3/uL (ref 150–450)
RBC: 4.56 x10E6/uL (ref 3.77–5.28)
RDW: 13 % (ref 11.7–15.4)
WBC: 5.7 10*3/uL (ref 3.4–10.8)

## 2023-01-25 LAB — VITAMIN B1: Thiamine: 126.8 nmol/L (ref 66.5–200.0)

## 2023-01-25 LAB — HEMOGLOBIN A1C
Est. average glucose Bld gHb Est-mCnc: 163 mg/dL
Hgb A1c MFr Bld: 7.3 % — ABNORMAL HIGH (ref 4.8–5.6)

## 2023-01-25 LAB — VITAMIN D 25 HYDROXY (VIT D DEFICIENCY, FRACTURES): Vit D, 25-Hydroxy: 38.9 ng/mL (ref 30.0–100.0)

## 2023-01-25 LAB — TSH: TSH: 1.12 u[IU]/mL (ref 0.450–4.500)

## 2023-01-25 LAB — VITAMIN B6: Vitamin B6: 3 ug/L — ABNORMAL LOW (ref 3.4–65.2)

## 2023-01-29 ENCOUNTER — Other Ambulatory Visit: Payer: Self-pay | Admitting: Family Medicine

## 2023-01-30 ENCOUNTER — Other Ambulatory Visit: Payer: Self-pay | Admitting: Family Medicine

## 2023-02-06 ENCOUNTER — Ambulatory Visit: Admission: RE | Admit: 2023-02-06 | Payer: Medicare PPO | Source: Ambulatory Visit

## 2023-02-14 ENCOUNTER — Ambulatory Visit: Payer: Medicare PPO | Attending: Student in an Organized Health Care Education/Training Program

## 2023-02-28 ENCOUNTER — Ambulatory Visit: Admission: RE | Admit: 2023-02-28 | Payer: Medicare PPO | Source: Ambulatory Visit

## 2023-03-07 ENCOUNTER — Other Ambulatory Visit: Payer: Self-pay | Admitting: Family Medicine

## 2023-03-07 DIAGNOSIS — E1149 Type 2 diabetes mellitus with other diabetic neurological complication: Secondary | ICD-10-CM

## 2023-03-25 NOTE — Progress Notes (Deleted)
Cardiology Office Note  Date:  03/25/2023   ID:  Deepti Handcock, DOB Jun 27, 1956, MRN [id number]  PCP:  Jacky Kindle, FNP   No chief complaint on file.   HPI:  [name] is a 67 year old woman with past medical history of HTN Smoking hx, continues to smoke COPD, shortness of breath obesity palpitations, PVCs on previous Holter monitor Diabetes type 2 Who presents for referral from elise payne for aortic valve calcification, cirrhosis  CT scan chest May 2024, images pulled up and reviewed    Previous Holter monitor reviewed with her in detail showing rare PVCs APCs  She try Lopressor but had side effects, stopped medication   PreviousCT lung screen 05/2016 reviewed with her in detail, images pulled up in the office  No significant coronary calcification noted, minimal aortic plaque   PMH:   has a past medical history of Anemia, Anxiety, Arthritis, Cancer (HCC), COPD (chronic obstructive pulmonary disease) (HCC), Depression, Diabetes mellitus without complication (HCC), Disordered sleep (01/13/2014), Dyspnea, Dysrhythmia, Fatigue, GERD (gastroesophageal reflux disease), Headache, Heart murmur, Hypertension, Hypothyroidism, Pneumonia, Thyroid disease, and Tremors of nervous system.  PSH:    Past Surgical History:  Procedure Laterality Date   APPENDECTOMY     CHOLECYSTECTOMY N/A 07/01/2019   Procedure: LAPAROSCOPIC CHOLECYSTECTOMY;  Surgeon: Henrene Dodge, MD;  Location: [facility] ORS;  Service: General;  Laterality: N/A;   COLECTOMY WITH COLOSTOMY CREATION/HARTMANN PROCEDURE N/A 09/03/2018   Error, this was not done.   COLONOSCOPY WITH PROPOFOL N/A 03/08/2019   Procedure: COLONOSCOPY WITH PROPOFOL;  Surgeon: Pasty Spillers, MD;  Location: [facility] ENDOSCOPY;  Service: Endoscopy;  Laterality: N/A;   CYSTOSCOPY WITH STENT PLACEMENT Bilateral 09/03/2018   Procedure: CYSTOSCOPY WITH STENT PLACEMENT-LIGHTED STENTS;  Surgeon: Vanna Scotland, MD;  Location: [facility] ORS;  Service: Urology;   Laterality: Bilateral;   LAPAROSCOPIC SIGMOID COLECTOMY N/A 09/03/2018   Procedure: LAPAROSCOPIC SIGMOID COLECTOMY;  Surgeon: Henrene Dodge, MD;  Location: [facility] ORS;  Service: General;  Laterality: N/A;   percutaneous drainage tube  07/2018   2nd tube placed. not healing in colon, causing a fistula   TONSILLECTOMY     TOTAL KNEE ARTHROPLASTY Left 05/10/2021   Procedure: TOTAL KNEE ARTHROPLASTY;  Surgeon: Lyndle Herrlich, MD;  Location: [facility] ORS;  Service: Orthopedics;  Laterality: Left;   TOTAL KNEE ARTHROPLASTY Right 01/10/2022   Procedure: TOTAL KNEE ARTHROPLASTY;  Surgeon: Lyndle Herrlich, MD;  Location: [facility] ORS;  Service: Orthopedics;  Laterality: Right;   TUBAL LIGATION      Current Outpatient Medications  Medication Sig Dispense Refill   acetaminophen (TYLENOL) 500 MG tablet Take 1,000 mg by mouth every 6 (six) hours as needed for mild pain or moderate pain.      amLODipine (NORVASC) 5 MG tablet TAKE 1 TABLET(5 MG) BY MOUTH DAILY 90 tablet 1   aspirin 81 MG chewable tablet Chew 1 tablet (81 mg total) by mouth 2 (two) times daily. 60 tablet 0   blood glucose meter kit and supplies Dispense based on patient and insurance preference. Once daily. (FOR ICD-10 E10.9, E11.9). 1 each 0   buPROPion (WELLBUTRIN XL) 300 MG 24 hr tablet Take 1 tablet (300 mg total) by mouth daily. 90 tablet 3   Cholecalciferol (VITAMIN D) 50 MCG (2000 UT) tablet Take 2,000 Units by mouth daily.     diclofenac (VOLTAREN) 50 MG EC tablet      diclofenac Sodium (VOLTAREN) 1 % GEL      DULoxetine (CYMBALTA) 60 MG capsule Take 1 capsule (  60 mg total) by mouth 2 (two) times daily. 180 capsule 1   fluticasone (FLONASE) 50 MCG/ACT nasal spray USE 2 SPRAYS INTO BOTH NOSTRILS DAILY. 48 g 1   Fluticasone-Umeclidin-Vilant (TRELEGY ELLIPTA) 100-62.5-25 MCG/ACT AEPB Inhale 1 puff into the lungs daily. 1 each 11   gabapentin (NEURONTIN) 300 MG capsule Take 1 capsule (300 mg total) by mouth 2 (two) times daily. Take 2 capsules PO  every morning; Take 3 capsules PO every evening. 60 capsule 1   glucose blood (CONTOUR NEXT TEST) test strip To check blood sugar once daily 100 each 12   ipratropium-albuterol (DUONEB) 0.5-2.5 (3) MG/3ML SOLN Take 3 mLs by nebulization every 6 (six) hours as needed. 360 mL 11   levothyroxine (SYNTHROID) 75 MCG tablet Take 1 tablet (75 mcg total) by mouth daily before breakfast. 90 tablet 3   loperamide (IMODIUM A-D) 2 MG tablet Take 2-4 mg by mouth 4 (four) times daily as needed for diarrhea or loose stools.     meloxicam (MOBIC) 15 MG tablet Take 1 tablet (15 mg total) by mouth daily. 90 tablet 1   metFORMIN (GLUCOPHAGE-XR) 500 MG 24 hr tablet TAKE 2 TABLETS TWICE DAILY WITH MEALS 360 tablet 1   metoCLOPramide (REGLAN) 10 MG tablet TAKE 1 TABLET(10 MG) BY MOUTH EVERY 8 HOURS AS NEEDED FOR NAUSEA 90 tablet 0   metoprolol succinate (TOPROL-XL) 50 MG 24 hr tablet Take 1 tablet (50 mg total) by mouth daily. 90 tablet 3   metoprolol succinate (TOPROL-XL) 50 MG 24 hr tablet Take 1 tablet (50 mg total) by mouth daily. Take with or immediately following a meal. 30 tablet 0   montelukast (SINGULAIR) 10 MG tablet Take 1 tablet (10 mg total) by mouth at bedtime. 90 tablet 1   nystatin (MYCOSTATIN) 100000 UNIT/ML suspension Take 5 mLs (500,000 Units total) by mouth 4 (four) times daily. 473 mL 0   nystatin (MYCOSTATIN/NYSTOP) powder Apply 1 application topically 3 (three) times daily. 60 g 0   oxybutynin (DITROPAN-XL) 5 MG 24 hr tablet Take 1 tablet (5 mg total) by mouth at bedtime. 90 tablet 5   pantoprazole (PROTONIX) 20 MG tablet TAKE 1 TABLET(20 MG) BY MOUTH TWICE DAILY BEFORE A MEAL (Patient taking differently: Take 20 mg by mouth 2 (two) times daily before a meal. TAKE 1 TABLET(20 MG) BY MOUTH TWICE DAILY BEFORE A MEAL) 180 tablet 0   primidone (MYSOLINE) 50 MG tablet Take 0.5 tablets (25 mg total) by mouth 2 (two) times daily.     rosuvastatin (CRESTOR) 10 MG tablet Take 1 tablet (10 mg total) by mouth  daily. 90 tablet 3   traMADol (ULTRAM) 50 MG tablet Take 50 mg by mouth 2 (two) times daily as needed for moderate pain.     No current facility-administered medications for this visit.     Allergies:   Dexilant [dexlansoprazole]   Social History:  The patient  reports that she has been smoking cigarettes. She started smoking about 43 years ago. She has a 90 pack-year smoking history. She has never used smokeless tobacco. She reports that she does not drink alcohol and does not use drugs.   Family History:   family history includes Alcohol abuse in her brother, maternal grandmother, maternal uncle, sister, and son; Arthritis in her sister; Breast cancer in her brother; Diabetes in her maternal aunt, maternal grandmother, and sister; Drug abuse in her son and son; Heart attack in her brother; Heart disease (age of onset: 3) in her father; Heart  disease (age of onset: 64) in her brother; Lung cancer in her mother; Pancreatic cancer in her maternal aunt.    Review of Systems: ROS   PHYSICAL EXAM: VS:  There were no vitals taken for this visit. , BMI There is no height or weight on file to calculate BMI. GEN: Well nourished, well developed, in no acute distress HEENT: normal Neck: no JVD, carotid bruits, or masses Cardiac: RRR; no murmurs, rubs, or gallops,no edema  Respiratory:  clear to auscultation bilaterally, normal work of breathing GI: soft, nontender, nondistended, + BS MS: no deformity or atrophy Skin: warm and dry, no rash Neuro:  Strength and sensation are intact Psych: euthymic mood, full affect    Recent Labs: 06/26/2022: B Natriuretic Peptide 20.2 07/01/2022: Magnesium 2.1 01/18/2023: ALT 18; BUN 11; Creatinine, Ser 0.68; Hemoglobin 14.0; Platelets 288; Potassium 4.3; Sodium 140; TSH 1.120    Lipid Panel Lab Results  Component Value Date   CHOL 169 12/07/2021   HDL 60 12/07/2021   LDLCALC 83 12/07/2021   TRIG 149 12/07/2021      Wt Readings from Last 3  Encounters:  01/18/23 208 lb 8 oz (94.6 kg)  11/03/22 200 lb 1.6 oz (90.8 kg)  10/31/22 205 lb (93 kg)       ASSESSMENT AND PLAN:  Problem List Items Addressed This Visit   None    Disposition:   F/U  12 months   Total encounter time more than 30 minutes  Greater than 50% was spent in counseling and coordination of care with the patient    Signed, Dossie Arbour, M.D., Ph.D. [facility] [place], [place] [phone]

## 2023-03-27 ENCOUNTER — Ambulatory Visit: Payer: Medicare PPO | Attending: Cardiovascular Disease | Admitting: Cardiovascular Disease

## 2023-03-27 DIAGNOSIS — E1149 Type 2 diabetes mellitus with other diabetic neurological complication: Secondary | ICD-10-CM

## 2023-03-27 DIAGNOSIS — I152 Hypertension secondary to endocrine disorders: Secondary | ICD-10-CM

## 2023-03-27 DIAGNOSIS — I700 Atherosclerosis of aorta: Secondary | ICD-10-CM

## 2023-03-27 DIAGNOSIS — J432 Centrilobular emphysema: Secondary | ICD-10-CM

## 2023-03-27 DIAGNOSIS — I359 Nonrheumatic aortic valve disorder, unspecified: Secondary | ICD-10-CM

## 2023-03-27 DIAGNOSIS — E785 Hyperlipidemia, unspecified: Secondary | ICD-10-CM

## 2023-03-28 ENCOUNTER — Encounter: Payer: Self-pay | Admitting: Cardiovascular Disease

## 2023-03-30 ENCOUNTER — Ambulatory Visit: Payer: Medicare PPO | Attending: Student in an Organized Health Care Education/Training Program

## 2023-04-12 ENCOUNTER — Ambulatory Visit: Payer: Medicare PPO | Admitting: Student in an Organized Health Care Education/Training Program

## 2023-05-01 ENCOUNTER — Ambulatory Visit (INDEPENDENT_AMBULATORY_CARE_PROVIDER_SITE_OTHER): Payer: Medicare PPO

## 2023-05-01 ENCOUNTER — Other Ambulatory Visit: Payer: Self-pay | Admitting: Family Medicine

## 2023-05-01 VITALS — BP 116/60 | Ht 67.52 in | Wt 214.8 lb

## 2023-05-01 DIAGNOSIS — K6289 Other specified diseases of anus and rectum: Secondary | ICD-10-CM

## 2023-05-01 DIAGNOSIS — Z0000 Encounter for general adult medical examination without abnormal findings: Secondary | ICD-10-CM

## 2023-05-01 DIAGNOSIS — Z1211 Encounter for screening for malignant neoplasm of colon: Secondary | ICD-10-CM

## 2023-05-01 DIAGNOSIS — Z23 Encounter for immunization: Secondary | ICD-10-CM

## 2023-05-01 DIAGNOSIS — R159 Full incontinence of feces: Secondary | ICD-10-CM

## 2023-05-01 NOTE — Progress Notes (Signed)
Subjective:   Linda Bradford is a 67 y.o. female who presents for Medicare Annual (Subsequent) preventive examination.  Visit Complete: In person  Patient Medicare AWV questionnaire was completed by the patient on (not done); I have confirmed that all information answered by patient is correct and no changes since this date.  Cardiac Risk Factors include: advanced age (>74men, >90 women);diabetes mellitus;dyslipidemia;hypertension;obesity (BMI >30kg/m2);sedentary lifestyle;smoking/ tobacco exposure    Objective:    Today's Vitals   05/01/23 1516 05/01/23 1521  Weight: 214 lb 12.8 oz (97.4 kg)   Height: 5' 7.52" (1.715 m)   PainSc:  8    Body mass index is 33.13 kg/m.     05/01/2023    3:39 PM 11/01/2022   10:41 AM 10/31/2022   11:18 AM 10/28/2022   10:46 AM 07/02/2022    2:39 PM 06/27/2022   12:00 PM 06/23/2022    6:05 PM  Advanced Directives  Does Patient Have a Medical Advance Directive? Yes No No No No No Yes  Type of Estate agent of [place];Living will     Healthcare Power of [employer] of [place];Living will  Copy of Healthcare Power of Attorney in Chart?      No - copy requested   Would patient like information on creating a medical advance directive?  No - Patient declined No - Patient declined No - Patient declined  No - Patient declined     Current Medications (verified) Outpatient Encounter Medications as of 05/01/2023  Medication Sig   acetaminophen (TYLENOL) 500 MG tablet Take 1,000 mg by mouth every 6 (six) hours as needed for mild pain or moderate pain.    amLODipine (NORVASC) 5 MG tablet TAKE 1 TABLET(5 MG) BY MOUTH DAILY   aspirin 81 MG chewable tablet Chew 1 tablet (81 mg total) by mouth 2 (two) times daily.   blood glucose meter kit and supplies Dispense based on patient and insurance preference. Once daily. (FOR ICD-10 E10.9, E11.9).   buPROPion (WELLBUTRIN XL) 300 MG 24 hr tablet Take 1 tablet (300 mg total) by mouth  daily.   Cholecalciferol (VITAMIN D) 50 MCG (2000 UT) tablet Take 2,000 Units by mouth daily.   diclofenac (VOLTAREN) 50 MG EC tablet    diclofenac Sodium (VOLTAREN) 1 % GEL    DULoxetine (CYMBALTA) 60 MG capsule Take 1 capsule (60 mg total) by mouth 2 (two) times daily.   fluticasone (FLONASE) 50 MCG/ACT nasal spray USE 2 SPRAYS INTO BOTH NOSTRILS DAILY.   Fluticasone-Umeclidin-Vilant (TRELEGY ELLIPTA) 100-62.5-25 MCG/ACT AEPB Inhale 1 puff into the lungs daily.   gabapentin (NEURONTIN) 300 MG capsule Take 1 capsule (300 mg total) by mouth 2 (two) times daily. Take 2 capsules PO every morning; Take 3 capsules PO every evening.   glucose blood (CONTOUR NEXT TEST) test strip To check blood sugar once daily   ipratropium-albuterol (DUONEB) 0.5-2.5 (3) MG/3ML SOLN Take 3 mLs by nebulization every 6 (six) hours as needed.   levothyroxine (SYNTHROID) 75 MCG tablet Take 1 tablet (75 mcg total) by mouth daily before breakfast.   loperamide (IMODIUM A-D) 2 MG tablet Take 2-4 mg by mouth 4 (four) times daily as needed for diarrhea or loose stools.   meloxicam (MOBIC) 15 MG tablet Take 1 tablet (15 mg total) by mouth daily.   metFORMIN (GLUCOPHAGE-XR) 500 MG 24 hr tablet TAKE 2 TABLETS TWICE DAILY WITH MEALS   metoCLOPramide (REGLAN) 10 MG tablet TAKE 1 TABLET(10 MG) BY MOUTH EVERY 8 HOURS AS NEEDED  FOR NAUSEA   metoprolol succinate (TOPROL-XL) 50 MG 24 hr tablet Take 1 tablet (50 mg total) by mouth daily.   metoprolol succinate (TOPROL-XL) 50 MG 24 hr tablet Take 1 tablet (50 mg total) by mouth daily. Take with or immediately following a meal.   montelukast (SINGULAIR) 10 MG tablet Take 1 tablet (10 mg total) by mouth at bedtime.   nystatin (MYCOSTATIN) 100000 UNIT/ML suspension Take 5 mLs (500,000 Units total) by mouth 4 (four) times daily.   nystatin (MYCOSTATIN/NYSTOP) powder Apply 1 application topically 3 (three) times daily.   oxybutynin (DITROPAN-XL) 5 MG 24 hr tablet Take 1 tablet (5 mg total) by  mouth at bedtime.   pantoprazole (PROTONIX) 20 MG tablet TAKE 1 TABLET(20 MG) BY MOUTH TWICE DAILY BEFORE A MEAL (Patient taking differently: Take 20 mg by mouth 2 (two) times daily before a meal. TAKE 1 TABLET(20 MG) BY MOUTH TWICE DAILY BEFORE A MEAL)   primidone (MYSOLINE) 50 MG tablet Take 0.5 tablets (25 mg total) by mouth 2 (two) times daily.   rosuvastatin (CRESTOR) 10 MG tablet Take 1 tablet (10 mg total) by mouth daily.   traMADol (ULTRAM) 50 MG tablet Take 50 mg by mouth 2 (two) times daily as needed for moderate pain.   No facility-administered encounter medications on file as of 05/01/2023.    Allergies (verified) Dexilant [dexlansoprazole]   History: Past Medical History:  Diagnosis Date   Anemia    Anxiety    Arthritis    Cancer (HCC)    skin cancer, removed   COPD (chronic obstructive pulmonary disease) (HCC)    Depression    Diabetes mellitus without complication (HCC)    Disordered sleep 01/13/2014   Dyspnea    Dysrhythmia    occasional pvc's. not being treated.   Fatigue    GERD (gastroesophageal reflux disease)    Headache    Heart murmur    Hypertension    Hypothyroidism    Pneumonia    Thyroid disease    Tremors of nervous system    Past Surgical History:  Procedure Laterality Date   APPENDECTOMY     CHOLECYSTECTOMY N/A 07/01/2019   Procedure: LAPAROSCOPIC CHOLECYSTECTOMY;  Surgeon: Henrene Dodge, MD;  Location: [facility];  Service: General;  Laterality: N/A;   COLECTOMY WITH COLOSTOMY CREATION/HARTMANN PROCEDURE N/A 09/03/2018   Error, this was not done.   COLONOSCOPY WITH PROPOFOL N/A 03/08/2019   Procedure: COLONOSCOPY WITH PROPOFOL;  Surgeon: Pasty Spillers, MD;  Location: [facility];  Service: Endoscopy;  Laterality: N/A;   CYSTOSCOPY WITH STENT PLACEMENT Bilateral 09/03/2018   Procedure: CYSTOSCOPY WITH STENT PLACEMENT-LIGHTED STENTS;  Surgeon: Vanna Scotland, MD;  Location: [facility];  Service: Urology;  Laterality: Bilateral;    LAPAROSCOPIC SIGMOID COLECTOMY N/A 09/03/2018   Procedure: LAPAROSCOPIC SIGMOID COLECTOMY;  Surgeon: Henrene Dodge, MD;  Location: [facility];  Service: General;  Laterality: N/A;   percutaneous drainage tube  07/2018   2nd tube placed. not healing in colon, causing a fistula   TONSILLECTOMY     TOTAL KNEE ARTHROPLASTY Left 05/10/2021   Procedure: TOTAL KNEE ARTHROPLASTY;  Surgeon: Lyndle Herrlich, MD;  Location: [facility];  Service: Orthopedics;  Laterality: Left;   TOTAL KNEE ARTHROPLASTY Right 01/10/2022   Procedure: TOTAL KNEE ARTHROPLASTY;  Surgeon: Lyndle Herrlich, MD;  Location: [facility];  Service: Orthopedics;  Laterality: Right;   TUBAL LIGATION     Family History  Problem Relation Age of Onset   Lung cancer Mother  Alcohol abuse Sister    Arthritis Sister    Alcohol abuse Brother    Heart disease Brother 107   Heart attack Brother    Pancreatic cancer Maternal Aunt    Diabetes Maternal Aunt    Alcohol abuse Son    Drug abuse Son    Drug abuse Son    Heart disease Father 8   Diabetes Sister    Breast cancer Brother    Alcohol abuse Maternal Uncle    Alcohol abuse Maternal Grandmother    Diabetes Maternal Grandmother    Social History   Socioeconomic History   Marital status: Widowed    Spouse name: Rosanne Ashing   Number of children: 2   Years of education: Not on file   Highest education level: Not on file  Occupational History   Occupation: Energy manager    Comment: currently employed  Tobacco Use   Smoking status: Every Day    Current packs/day: 2.00    Average packs/day: 2.0 packs/day for 45.0 years (89.9 ttl pk-yrs)    Types: Cigarettes    Start date: 03/02/1980   Smokeless tobacco: Never   Tobacco comments:    1.5PPD 05/16/2022  Vaping Use   Vaping status: Former   Devices: only for a month. did not like  Substance and Sexual Activity   Alcohol use: No    Alcohol/week: 0.0 standard drinks of alcohol   Drug use: No   Sexual activity: Not Currently  Other  Topics Concern   Not on file  Social History Narrative   Live with hubby at home, sister susie with down syndrome   Social Determinants of Health   Financial Resource Strain: Low Risk  (05/01/2023)   Overall Financial Resource Strain (CARDIA)    Difficulty of Paying Living Expenses: Not hard at all  Food Insecurity: No Food Insecurity (05/01/2023)   Hunger Vital Sign    Worried About Running Out of Food in the Last Year: Never true    Ran Out of Food in the Last Year: Never true  Transportation Needs: No Transportation Needs (05/01/2023)   PRAPARE - Administrator, Civil Service (Medical): No    Lack of Transportation (Non-Medical): No  Physical Activity: Inactive (05/01/2023)   Exercise Vital Sign    Days of Exercise per Week: 0 days    Minutes of Exercise per Session: 0 min  Stress: Stress Concern Present (05/01/2023)   [employer] of Occupational Health - Occupational Stress Questionnaire    Feeling of Stress : To some extent  Social Connections: Moderately Integrated (05/01/2023)   Social Connection and Isolation Panel [NHANES]    Frequency of Communication with Friends and Family: Once a week    Frequency of Social Gatherings with Friends and Family: Never    Attends Religious Services: More than 4 times per year    Active Member of [employer] or Organizations: Yes    Attends Banker Meetings: 1 to 4 times per year    Marital Status: Married    Tobacco Counseling Ready to quit: Not Answered Counseling given: Not Answered Tobacco comments: 1.5PPD 05/16/2022   Clinical Intake:  Pre-visit preparation completed: Yes  Pain : 0-10 Pain Score: 8  Pain Location: Hand Pain Descriptors / Indicators: Aching Pain Onset: More than a month ago Pain Frequency: Constant Pain Relieving Factors: tramadol and tylenol  Pain Relieving Factors: tramadol and tylenol  BMI - recorded: 33.13 Nutritional Status: BMI > 30  Obese Nutritional Risks: None Diabetes:  Yes CBG done?: No Did pt. bring in CBG monitor from home?: No  How often do you need to have someone help you when you read instructions, pamphlets, or other written materials from your doctor or pharmacy?: 1 - Never  Interpreter Needed?: No  Comments: sons lives her Information entered by :: B.Takirah Binford,LPN   Activities of Daily Living    05/01/2023    3:40 PM 01/18/2023    2:31 PM  In your present state of health, do you have any difficulty performing the following activities:  Hearing? 1 0  Vision? 0 0  Difficulty concentrating or making decisions? 0 0  Walking or climbing stairs? 0 0  Dressing or bathing? 0 0  Doing errands, shopping? 0 0  Preparing Food and eating ? N   Using the Toilet? N   In the past six months, have you accidently leaked urine? Y   Do you have problems with loss of bowel control? Y   Comment taking the metformin   Managing your Medications? N   Managing your Finances? N   Housekeeping or managing your Housekeeping? N     Patient Care Team: Jacky Kindle, FNP as PCP - General (Family Medicine) [facility], [facility])  Indicate any recent Medical Services you may have received from other than [facility] providers in the past year (date may be approximate).     Assessment:   This is a routine wellness examination for [place].  Hearing/Vision screen Hearing Screening - Comments:: Pt says her hearing is ok Vision Screening - Comments:: Pt says her vision is 20/20 with her glasses   Goals Addressed             This Visit's Progress    DIET - EAT MORE FRUITS AND VEGETABLES   On track    Quit smoking / using tobacco   Not on track      Depression Screen    05/01/2023    3:34 PM 01/18/2023    2:31 PM 11/03/2022    1:32 PM 10/20/2022    2:52 PM 08/29/2022    3:28 PM 04/18/2022    2:21 PM 12/07/2021    2:35 PM  PHQ 2/9 Scores  PHQ - 2 Score 0 0 0 0 0 1 0  PHQ- 9 Score  3 8 3  0 1 0    Fall Risk    05/01/2023    3:28 PM 01/18/2023     2:31 PM 11/03/2022    1:32 PM 10/20/2022    2:51 PM 08/29/2022    3:27 PM  Fall Risk   Falls in the past year? 0 1 0 1 1  Number falls in past yr: 0 1 0 0 1  Comment     6  Injury with Fall? 0 0 0 1 0  Risk for fall due to : No Fall Risks History of fall(s) No Fall Risks History of fall(s)   Follow up Education provided;Falls prevention discussed Falls evaluation completed       MEDICARE RISK AT HOME: Medicare Risk at Home Any stairs in or around the home?: Yes If so, are there any without handrails?: Yes Home free of loose throw rugs in walkways, pet beds, electrical cords, etc?: Yes Adequate lighting in your home to reduce risk of falls?: Yes Life alert?: No Use of a cane, walker or w/c?: No Grab bars in the bathroom?: Yes Shower chair or bench in shower?: Yes Elevated toilet seat or a handicapped toilet?: Yes  TIMED UP AND GO:  Was the test performed?  Yes  Length of time to ambulate 10 feet: 12 sec Gait steady and fast without use of assistive device    Cognitive Function:        05/01/2023    3:43 PM  6CIT Screen  What Year? 0 points  What month? 0 points  What time? 0 points  Count back from 20 0 points  Months in reverse 0 points  Repeat phrase 0 points  Total Score 0 points    Immunizations Immunization History  Administered Date(s) Administered   Fluad Quad(high Dose 65+) 05/11/2021   Fluad Trivalent(High Dose 65+) 05/01/2023   Influenza, High Dose Seasonal PF 04/18/2022   Influenza,inj,Quad PF,6+ Mos 05/10/2017, 04/29/2020   PFIZER Comirnaty(Gray Top)Covid-19 Tri-Sucrose Vaccine 07/12/2022   PFIZER(Purple Top)SARS-COV-2 Vaccination 10/19/2019, 11/09/2019   Pneumococcal Conjugate-13 05/10/2016   Pneumococcal Polysaccharide-23 05/11/2021   Tdap 08/24/2017   Zoster, Live 05/10/2016    TDAP status: Up to date  Flu Vaccine status: Completed at today's visit  Pneumococcal vaccine status: Up to date  Covid-19 vaccine status: Completed  vaccines  Qualifies for Shingles Vaccine? Yes   Zostavax completed No   Shingrix Completed?: No.    Education has been provided regarding the importance of this vaccine. Patient has been advised to call insurance company to determine out of pocket expense if they have not yet received this vaccine. Advised may also receive vaccine at local pharmacy or Health Dept. Verbalized acceptance and understanding.  Screening Tests Health Maintenance  Topic Date Due   Zoster Vaccines- Shingrix (1 of 2) 01/04/1975   Colonoscopy  03/07/2022   COVID-19 Vaccine (4 - 2023-24 season) 04/02/2023   MAMMOGRAM  05/18/2023   Diabetic kidney evaluation - Urine ACR  07/13/2023   FOOT EXAM  07/13/2023   HEMOGLOBIN A1C  07/20/2023   OPHTHALMOLOGY EXAM  09/09/2023   Lung Cancer Screening  12/22/2023   Diabetic kidney evaluation - eGFR measurement  01/18/2024   Medicare Annual Wellness (AWV)  04/30/2024   DTaP/Tdap/Td (2 - Td or Tdap) 08/25/2027   Pneumonia Vaccine 80+ Years old  Completed   INFLUENZA VACCINE  Completed   DEXA SCAN  Completed   Hepatitis C Screening  Completed   HPV VACCINES  Aged Out    Health Maintenance  Health Maintenance Due  Topic Date Due   Zoster Vaccines- Shingrix (1 of 2) 01/04/1975   Colonoscopy  03/07/2022   COVID-19 Vaccine (4 - 2023-24 season) 04/02/2023    Colorectal cancer screening: Type of screening: Colonoscopy. Completed no. Repeat every 5-10 years  Mammogram status: Completed yes. Repeat every year  Bone Density status: Completed yes. Results reflect: Bone density results: NORMAL. Repeat every 5 years.  Lung Cancer Screening: (Low Dose CT Chest recommended if Age 73-80 years, 20 pack-year currently smoking OR have quit w/in 15years.) does qualify.   Lung Cancer Screening Referral: no already ordered  Additional Screening:  Hepatitis C Screening: does not qualify; Completed yes  Vision Screening: Recommended annual ophthalmology exams for early detection of  glaucoma and other disorders of the eye. Is the patient up to date with their annual eye exam?  Yes  Who is the provider or what is the name of the office in which the patient attends annual eye exams? [facility] If pt is not established with a provider, would they like to be referred to a provider to establish care? No .   Dental Screening: Recommended annual dental exams for proper  oral hygiene  Diabetic Foot Exam: Diabetic Foot Exam: Overdue, Pt has been advised about the importance in completing this exam. Pt is scheduled for diabetic foot exam on 05/03/23.  Community Resource Referral / Chronic Care Management: CRR required this visit?  No   CCM required this visit?  No     Plan:     I have personally reviewed and noted the following in the patient's chart:   Medical and social history Use of alcohol, tobacco or illicit drugs  Current medications and supplements including opioid prescriptions. Patient is not currently taking opioid prescriptions. Functional ability and status Nutritional status Physical activity Advanced directives List of other physicians Hospitalizations, surgeries, and ER visits in previous 12 months Vitals Screenings to include cognitive, depression, and falls Referrals and appointments  In addition, I have reviewed and discussed with patient certain preventive protocols, quality metrics, and best practice recommendations. A written personalized care plan for preventive services as well as general preventive health recommendations were provided to patient.     Sue Lush, LPN   1/61/0960   After Visit Summary: (MyChart) Due to this being a telephonic visit, the after visit summary with patients personalized plan was offered to patient via MyChart   Nurse Notes: Pt states she doing alright some days better than others as she is still grieving the loss of her husband in February this year. She relays her sons live with her. She expresses the  need to increase her socialization but struggles with not really wanting to. Pt does voice complaints of lose stools after every meal and having to wear pads. She believes the Metformin has worsened these symptoms. Pt desires to talk with PCP on next week seeking improvement of symptoms.  Pt due for colonoscopy (Aug 2024)

## 2023-05-01 NOTE — Patient Instructions (Addendum)
Linda Bradford , Thank you for taking time to come for your Medicare Wellness Visit. I appreciate your ongoing commitment to your health goals. Please review the following plan we discussed and let me know if I can assist you in the future.   Referrals/Orders/Follow-Ups/Clinician Recommendations: Colonoscopy  This is a list of the screening recommended for you and due dates:  Health Maintenance  Topic Date Due   Zoster (Shingles) Vaccine (1 of 2) 01/04/1975   Colon Cancer Screening  03/07/2022   COVID-19 Vaccine (4 - 2023-24 season) 04/02/2023   Mammogram  05/18/2023   Yearly kidney health urinalysis for diabetes  07/13/2023   Complete foot exam   07/13/2023   Hemoglobin A1C  07/20/2023   Eye exam for diabetics  09/09/2023   Screening for Lung Cancer  12/22/2023   Yearly kidney function blood test for diabetes  01/18/2024   Medicare Annual Wellness Visit  04/30/2024   DTaP/Tdap/Td vaccine (2 - Td or Tdap) 08/25/2027   Pneumonia Vaccine  Completed   Flu Shot  Completed   DEXA scan (bone density measurement)  Completed   Hepatitis C Screening  Completed   HPV Vaccine  Aged Out    Advanced directives: (Copy Requested) Please bring a copy of your health care power of attorney and living will to the office to be added to your chart at your convenience.  Next Medicare Annual Wellness Visit scheduled for next year: Yes 05/01/24 @ 2:20pm telephone

## 2023-05-03 ENCOUNTER — Encounter: Payer: Self-pay | Admitting: Family Medicine

## 2023-05-03 ENCOUNTER — Ambulatory Visit: Payer: Medicare PPO | Admitting: Family Medicine

## 2023-05-03 VITALS — BP 132/63 | HR 72 | Resp 16 | Ht 71.0 in | Wt 216.0 lb

## 2023-05-03 DIAGNOSIS — T383X5A Adverse effect of insulin and oral hypoglycemic [antidiabetic] drugs, initial encounter: Secondary | ICD-10-CM | POA: Diagnosis not present

## 2023-05-03 DIAGNOSIS — W19XXXS Unspecified fall, sequela: Secondary | ICD-10-CM

## 2023-05-03 DIAGNOSIS — J418 Mixed simple and mucopurulent chronic bronchitis: Secondary | ICD-10-CM | POA: Diagnosis not present

## 2023-05-03 DIAGNOSIS — F39 Unspecified mood [affective] disorder: Secondary | ICD-10-CM | POA: Insufficient documentation

## 2023-05-03 DIAGNOSIS — W19XXXD Unspecified fall, subsequent encounter: Secondary | ICD-10-CM | POA: Diagnosis not present

## 2023-05-03 DIAGNOSIS — E1149 Type 2 diabetes mellitus with other diabetic neurological complication: Secondary | ICD-10-CM

## 2023-05-03 DIAGNOSIS — Z7984 Long term (current) use of oral hypoglycemic drugs: Secondary | ICD-10-CM

## 2023-05-03 DIAGNOSIS — I700 Atherosclerosis of aorta: Secondary | ICD-10-CM | POA: Diagnosis not present

## 2023-05-03 DIAGNOSIS — W19XXXA Unspecified fall, initial encounter: Secondary | ICD-10-CM | POA: Insufficient documentation

## 2023-05-03 DIAGNOSIS — K521 Toxic gastroenteritis and colitis: Secondary | ICD-10-CM | POA: Insufficient documentation

## 2023-05-03 LAB — POCT GLYCOSYLATED HEMOGLOBIN (HGB A1C): Hemoglobin A1C: 7.1 % — AB (ref 4.0–5.6)

## 2023-05-03 NOTE — Progress Notes (Signed)
Established patient visit   Patient: Linda Bradford   DOB: Apr 20, 1956   67 y.o. Female  MRN: [id number] Visit Date: 05/03/2023  Today's healthcare provider: Jacky Kindle, FNP  Introduced to nurse practitioner role and practice setting.  All questions answered.  Discussed provider/patient relationship and expectations.  Subjective    Diarrhea    HPI     Diarrhea    Additional comments: Feels like side effect from Metformin, taking imodium       Last edited by Dollene Primrose, CMA on 05/03/2023  2:34 PM.     The patient, with a history of diabetes and sigmoidectomy, presents with concerns about gastrointestinal side effects from metformin. She reports loose, watery stools with brown material, occurring shortly after eating. The patient denies any mucus or blood in the stool, and reports no history of colitis or Crohn's disease. She also reports occasional soreness from hemorrhoids due to frequent use of the restroom. She denies bright red blood per rectum or concerns for melena.  In addition to the gastrointestinal concerns, the patient [place], which resulted in a knee injury. She describes the injury as painful, with occasional shooting pains. The area around the injury is still red and inflamed, a large formation of a scab remains. Encouraged to reach back out to ortho team.  The patient also discusses feelings of grief and depression following the loss of her husband. She describes a deep, sinking feeling in her chest, but denies any palpitations, arm pain, back pain, nausea, or reflux during these episodes. The patient is currently attending grief therapy and taking medication for depression. However, continues to note exacerbations.  Medications: Outpatient Medications Prior to Visit  Medication Sig   acetaminophen (TYLENOL) 500 MG tablet Take 1,000 mg by mouth every 6 (six) hours as needed for mild pain or moderate pain.    amLODipine  (NORVASC) 5 MG tablet TAKE 1 TABLET(5 MG) BY MOUTH DAILY   aspirin 81 MG chewable tablet Chew 1 tablet (81 mg total) by mouth 2 (two) times daily.   blood glucose meter kit and supplies Dispense based on patient and insurance preference. Once daily. (FOR ICD-10 E10.9, E11.9).   buPROPion (WELLBUTRIN XL) 300 MG 24 hr tablet Take 1 tablet (300 mg total) by mouth daily.   Cholecalciferol (VITAMIN D) 50 MCG (2000 UT) tablet Take 2,000 Units by mouth daily.   diclofenac (VOLTAREN) 50 MG EC tablet    diclofenac Sodium (VOLTAREN) 1 % GEL    DULoxetine (CYMBALTA) 60 MG capsule Take 1 capsule (60 mg total) by mouth 2 (two) times daily.   fluticasone (FLONASE) 50 MCG/ACT nasal spray USE 2 SPRAYS INTO BOTH NOSTRILS DAILY.   Fluticasone-Umeclidin-Vilant (TRELEGY ELLIPTA) 100-62.5-25 MCG/ACT AEPB Inhale 1 puff into the lungs daily.   gabapentin (NEURONTIN) 300 MG capsule Take 1 capsule (300 mg total) by mouth 2 (two) times daily. Take 2 capsules PO every morning; Take 3 capsules PO every evening.   glucose blood (CONTOUR NEXT TEST) test strip To check blood sugar once daily   ipratropium-albuterol (DUONEB) 0.5-2.5 (3) MG/3ML SOLN Take 3 mLs by nebulization every 6 (six) hours as needed.   levothyroxine (SYNTHROID) 75 MCG tablet Take 1 tablet (75 mcg total) by mouth daily before breakfast.   loperamide (IMODIUM A-D) 2 MG tablet Take 2-4 mg by mouth 4 (four) times daily as needed for diarrhea or loose stools.   meloxicam (MOBIC) 15 MG tablet Take 1 tablet (  15 mg total) by mouth daily.   metFORMIN (GLUCOPHAGE-XR) 500 MG 24 hr tablet TAKE 2 TABLETS TWICE DAILY WITH MEALS   metoCLOPramide (REGLAN) 10 MG tablet TAKE 1 TABLET(10 MG) BY MOUTH EVERY 8 HOURS AS NEEDED FOR NAUSEA   metoprolol succinate (TOPROL-XL) 50 MG 24 hr tablet Take 1 tablet (50 mg total) by mouth daily.   metoprolol succinate (TOPROL-XL) 50 MG 24 hr tablet Take 1 tablet (50 mg total) by mouth daily. Take with or immediately following a meal.    montelukast (SINGULAIR) 10 MG tablet Take 1 tablet (10 mg total) by mouth at bedtime.   nystatin (MYCOSTATIN) 100000 UNIT/ML suspension Take 5 mLs (500,000 Units total) by mouth 4 (four) times daily.   nystatin (MYCOSTATIN/NYSTOP) powder Apply 1 application topically 3 (three) times daily.   oxybutynin (DITROPAN-XL) 5 MG 24 hr tablet Take 1 tablet (5 mg total) by mouth at bedtime.   pantoprazole (PROTONIX) 20 MG tablet TAKE 1 TABLET(20 MG) BY MOUTH TWICE DAILY BEFORE A MEAL (Patient taking differently: Take 20 mg by mouth 2 (two) times daily before a meal. TAKE 1 TABLET(20 MG) BY MOUTH TWICE DAILY BEFORE A MEAL)   primidone (MYSOLINE) 50 MG tablet Take 0.5 tablets (25 mg total) by mouth 2 (two) times daily.   rosuvastatin (CRESTOR) 10 MG tablet Take 1 tablet (10 mg total) by mouth daily.   traMADol (ULTRAM) 50 MG tablet Take 50 mg by mouth 2 (two) times daily as needed for moderate pain.   No facility-administered medications prior to visit.    Review of Systems  Gastrointestinal:  Positive for diarrhea.     Objective    BP 132/63   Pulse 72   Resp 16   Ht 5\' 11"  (1.803 m)   Wt 216 lb (98 kg)   SpO2 97%   BMI 30.13 kg/m   Physical Exam Vitals and nursing note reviewed.  Constitutional:      General: She is not in acute distress.    Appearance: Normal appearance. She is obese. She is not ill-appearing, toxic-appearing or diaphoretic.  HENT:     Head: Normocephalic and atraumatic.  Cardiovascular:     Rate and Rhythm: Normal rate and regular rhythm.     Pulses: Normal pulses.     Heart sounds: Normal heart sounds. No murmur heard.    No friction rub. No gallop.  Pulmonary:     Effort: Pulmonary effort is normal. No respiratory distress.     Breath sounds: Normal breath sounds. No stridor. No wheezing, rhonchi or rales.  Chest:     Chest wall: No tenderness.  Abdominal:     General: Bowel sounds are normal.     Palpations: Abdomen is soft.  Musculoskeletal:        General:  No swelling, tenderness, deformity or signs of injury. Normal range of motion.     Right lower leg: No edema.     Left lower leg: No edema.  Skin:    General: Skin is warm and dry.     Capillary Refill: Capillary refill takes less than 2 seconds.     Coloration: Skin is not jaundiced or pale.     Findings: No bruising, erythema, lesion or rash.  Neurological:     General: No focal deficit present.     Mental Status: She is alert and oriented to person, place, and time. Mental status is at baseline.     Cranial Nerves: No cranial nerve deficit.     Sensory:  No sensory deficit.     Motor: No weakness.     Coordination: Coordination normal.  Psychiatric:        Mood and Affect: Mood normal.        Behavior: Behavior normal.        Thought Content: Thought content normal.        Judgment: Judgment normal.      No results found for any visits on 05/03/23.  Assessment & Plan    Knee Injury Recent fall with persistent pain and inflammation. Recommended to contact orthopedic surgeon for evaluation. -Contact orthopedic surgeon for evaluation.  Type 2 Diabetes Mellitus A1c 7.1, well controlled. Patient experiencing GI side effects from Metformin. -Discontinue Metformin given significant GI complaints; A1c with very little improvement despite start -Schedule follow-up in 3 months to monitor A1c following discontinuation of medication planned on 10/3  Depression/Grief Patient reports significant grief and depression following the loss of her husband. Currently attending grief therapy. -Continue grief therapy. -Consider cardiology referral for potential stress cardiomyopathy evaluation; has upcoming appt with Dr Mariah Milling   Chronic Obstructive Pulmonary Disease (COPD) Patient reports occasional dizziness but no exacerbation of symptoms requiring nebulizer use. -Continue current inhaler regimen. -Encourage physical activity to prevent fluid build-up in lungs.  Colon Polyps History of  polyps removed during colonoscopy in 2020. Upcoming consult with Dr. Lewie Loron for anesthetic clearance for traditional colonoscopy. -Continue with scheduled consult with Dr. Lewie Loron. -Ensure follow-up colonoscopy is scheduled.  General Health Maintenance -Received flu vaccine recently. No follow-ups on file.      Leilani Merl, FNP, have reviewed all documentation for this visit. The documentation on 05/03/23 for the exam, diagnosis, procedures, and orders are all accurate and complete.  Jacky Kindle, FNP  [facility] [phone] (phone) [phone] (fax)  [facility]

## 2023-05-05 DIAGNOSIS — Z96651 Presence of right artificial knee joint: Secondary | ICD-10-CM | POA: Diagnosis not present

## 2023-05-05 DIAGNOSIS — Z96652 Presence of left artificial knee joint: Secondary | ICD-10-CM | POA: Diagnosis not present

## 2023-05-05 DIAGNOSIS — S80211A Abrasion, right knee, initial encounter: Secondary | ICD-10-CM | POA: Diagnosis not present

## 2023-05-25 DIAGNOSIS — M47896 Other spondylosis, lumbar region: Secondary | ICD-10-CM | POA: Diagnosis not present

## 2023-05-25 DIAGNOSIS — M48061 Spinal stenosis, lumbar region without neurogenic claudication: Secondary | ICD-10-CM | POA: Diagnosis not present

## 2023-05-25 DIAGNOSIS — M791 Myalgia, unspecified site: Secondary | ICD-10-CM | POA: Diagnosis not present

## 2023-05-25 DIAGNOSIS — Z79899 Other long term (current) drug therapy: Secondary | ICD-10-CM | POA: Diagnosis not present

## 2023-05-25 DIAGNOSIS — M51369 Other intervertebral disc degeneration, lumbar region without mention of lumbar back pain or lower extremity pain: Secondary | ICD-10-CM | POA: Diagnosis not present

## 2023-05-25 DIAGNOSIS — M545 Low back pain, unspecified: Secondary | ICD-10-CM | POA: Diagnosis not present

## 2023-05-26 DIAGNOSIS — Z96653 Presence of artificial knee joint, bilateral: Secondary | ICD-10-CM | POA: Diagnosis not present

## 2023-05-26 DIAGNOSIS — S80211A Abrasion, right knee, initial encounter: Secondary | ICD-10-CM | POA: Diagnosis not present

## 2023-06-05 ENCOUNTER — Other Ambulatory Visit: Payer: Self-pay

## 2023-06-07 ENCOUNTER — Other Ambulatory Visit: Payer: Self-pay | Admitting: Family Medicine

## 2023-06-07 DIAGNOSIS — E1169 Type 2 diabetes mellitus with other specified complication: Secondary | ICD-10-CM

## 2023-06-07 NOTE — Progress Notes (Signed)
Celso Amy, PA-C [address]  [address]  [place], [place] [address]  Main: [phone]  Fax: [phone]   Gastroenterology Consultation  Referring Provider:     Jacky Kindle, FNP Primary Care Physician:  Jacky Kindle, FNP Primary Gastroenterologist:  Celso Amy, PA-C / Dr. Lannette Donath   Reason for Consultation:     Fecal incontinence, diarrhea, Repeat Colon        HPI:   Linda Bradford is a 67 y.o. y/o female referred for consultation & management  by Jacky Kindle, FNP.    Patient saw her PCP 05/03/2023 to evaluate diarrhea.  Patient was concerned about side effect of taking metformin.  Patient has history of diabetes and sigmoidectomy.  Patient has been having loose watery stools after eating.  Denies melena or hematochezia.  Metformin was discontinued and has not had any more diarrhea since stopping metformin.  She is currently having bowel movement daily or every other day which is formed.  Admits to intestinal gas.  No other GI symptoms.  Last colonoscopy 03/2019 by Dr. Maximino Greenland showed good prep, 2 flat (3 mm to 4 mm) tubular adenoma polyps removed from cecum.  Mild thickened folds in the ascending colon (biopsies benign).  4 flat tubular adenoma polyps (3 mm to 4 mm) in the sigmoid and transverse colon.  Previous anastomosis in the sigmoid colon.  Sigmoid diverticulosis.  3-year repeat colonoscopy is overdue.  Medical history significant for diabetes, COPD, GERD, cholecystectomy 06/2019, appendectomy, laparoscopic sigmoid colectomy 09/2018 (due to sigmoid diverticulitis with abscess).  Last labs 12/2022 showed normal CBC, CMP, TSH.  Normal hemoglobin 14.0.  Past Medical History:  Diagnosis Date   Anemia    Anxiety    Arthritis    Cancer (HCC)    skin cancer, removed   COPD (chronic obstructive pulmonary disease) (HCC)    Depression    Diabetes mellitus without complication (HCC)    Disordered sleep 01/13/2014   Dyspnea    Dysrhythmia    occasional pvc's.  not being treated.   Fatigue    GERD (gastroesophageal reflux disease)    Headache    Heart murmur    Hypertension    Hypothyroidism    Pneumonia    Thyroid disease    Tremors of nervous system     Past Surgical History:  Procedure Laterality Date   APPENDECTOMY     CHOLECYSTECTOMY N/A 07/01/2019   Procedure: LAPAROSCOPIC CHOLECYSTECTOMY;  Surgeon: Henrene Dodge, MD;  Location: [facility];  Service: General;  Laterality: N/A;   COLECTOMY WITH COLOSTOMY CREATION/HARTMANN PROCEDURE N/A 09/03/2018   Error, this was not done.   COLONOSCOPY WITH PROPOFOL N/A 03/08/2019   Procedure: COLONOSCOPY WITH PROPOFOL;  Surgeon: Pasty Spillers, MD;  Location: [facility];  Service: Endoscopy;  Laterality: N/A;   CYSTOSCOPY WITH STENT PLACEMENT Bilateral 09/03/2018   Procedure: CYSTOSCOPY WITH STENT PLACEMENT-LIGHTED STENTS;  Surgeon: Vanna Scotland, MD;  Location: [facility];  Service: Urology;  Laterality: Bilateral;   LAPAROSCOPIC SIGMOID COLECTOMY N/A 09/03/2018   Procedure: LAPAROSCOPIC SIGMOID COLECTOMY;  Surgeon: Henrene Dodge, MD;  Location: [facility];  Service: General;  Laterality: N/A;   percutaneous drainage tube  07/2018   2nd tube placed. not healing in colon, causing a fistula   TONSILLECTOMY     TOTAL KNEE ARTHROPLASTY Left 05/10/2021   Procedure: TOTAL KNEE ARTHROPLASTY;  Surgeon: Lyndle Herrlich, MD;  Location: [facility];  Service: Orthopedics;  Laterality: Left;   TOTAL KNEE ARTHROPLASTY Right 01/10/2022  Procedure: TOTAL KNEE ARTHROPLASTY;  Surgeon: Lyndle Herrlich, MD;  Location: [facility];  Service: Orthopedics;  Laterality: Right;   TUBAL LIGATION      Prior to Admission medications   Medication Sig Start Date End Date Taking? Authorizing Provider  acetaminophen (TYLENOL) 500 MG tablet Take 1,000 mg by mouth every 6 (six) hours as needed for mild pain or moderate pain.     [provider]  albuterol (VENTOLIN HFA) 108 (90 Base) MCG/ACT inhaler Inhale into the lungs.  02/22/23   [provider]  amLODipine (NORVASC) 5 MG tablet TAKE 1 TABLET(5 MG) BY MOUTH DAILY 12/20/22   Jacky Kindle, FNP  aspirin 81 MG chewable tablet Chew 1 tablet (81 mg total) by mouth 2 (two) times daily. 01/13/22   Altamese Cabal, PA-C  blood glucose meter kit and supplies Dispense based on patient and insurance preference. Once daily. (FOR ICD-10 E10.9, E11.9). 07/12/22   Jacky Kindle, FNP  buPROPion (WELLBUTRIN XL) 300 MG 24 hr tablet Take 1 tablet (300 mg total) by mouth daily. 08/17/22   Jacky Kindle, FNP  Cholecalciferol (VITAMIN D) 50 MCG (2000 UT) tablet Take 2,000 Units by mouth daily.    [provider]  diclofenac (VOLTAREN) 50 MG EC tablet     [provider]  diclofenac Sodium (VOLTAREN) 1 % GEL     [provider]  DULoxetine (CYMBALTA) 60 MG capsule Take 1 capsule (60 mg total) by mouth 2 (two) times daily. 08/29/22   Jacky Kindle, FNP  fluticasone (FLONASE) 50 MCG/ACT nasal spray USE 2 SPRAYS INTO BOTH NOSTRILS DAILY. 01/24/23   Jacky Kindle, FNP  Fluticasone-Umeclidin-Vilant (TRELEGY ELLIPTA) 100-62.5-25 MCG/ACT AEPB Inhale 1 puff into the lungs daily. 08/19/22   Jacky Kindle, FNP  gabapentin (NEURONTIN) 300 MG capsule Take 1 capsule (300 mg total) by mouth 2 (two) times daily. Take 2 capsules PO every morning; Take 3 capsules PO every evening. 11/03/22   Jacky Kindle, FNP  glucose blood (CONTOUR NEXT TEST) test strip To check blood sugar once daily 06/09/17   Margaretann Loveless, PA-C  ipratropium-albuterol (DUONEB) 0.5-2.5 (3) MG/3ML SOLN Take 3 mLs by nebulization every 6 (six) hours as needed. 08/29/22   Jacky Kindle, FNP  levothyroxine (SYNTHROID) 75 MCG tablet Take 1 tablet (75 mcg total) by mouth daily before breakfast. 08/17/22   Jacky Kindle, FNP  loperamide (IMODIUM A-D) 2 MG tablet Take 2-4 mg by mouth 4 (four) times daily as needed for diarrhea or loose stools.    [provider]  meloxicam (MOBIC) 15 MG tablet  Take 1 tablet (15 mg total) by mouth daily. 09/15/20   Margaretann Loveless, PA-C  metoCLOPramide (REGLAN) 10 MG tablet TAKE 1 TABLET(10 MG) BY MOUTH EVERY 8 HOURS AS NEEDED FOR NAUSEA 01/31/23   Jacky Kindle, FNP  metoprolol succinate (TOPROL-XL) 50 MG 24 hr tablet Take 1 tablet (50 mg total) by mouth daily. 01/18/23   Jacky Kindle, FNP  metoprolol succinate (TOPROL-XL) 50 MG 24 hr tablet Take 1 tablet (50 mg total) by mouth daily. Take with or immediately following a meal. 01/18/23   Jacky Kindle, FNP  montelukast (SINGULAIR) 10 MG tablet Take 1 tablet (10 mg total) by mouth at bedtime. 08/29/22   Jacky Kindle, FNP  nystatin (MYCOSTATIN) 100000 UNIT/ML suspension Take 5 mLs (500,000 Units total) by mouth 4 (four) times daily. 10/20/22   Jacky Kindle, FNP  nystatin (MYCOSTATIN/NYSTOP) powder  Apply 1 application topically 3 (three) times daily. 10/01/21   Jacky Kindle, FNP  oxybutynin (DITROPAN-XL) 5 MG 24 hr tablet Take 1 tablet (5 mg total) by mouth at bedtime. 08/18/22   Allie Bossier, MD  pantoprazole (PROTONIX) 20 MG tablet TAKE 1 TABLET(20 MG) BY MOUTH TWICE DAILY BEFORE A MEAL Patient taking differently: Take 20 mg by mouth 2 (two) times daily before a meal. TAKE 1 TABLET(20 MG) BY MOUTH TWICE DAILY BEFORE A MEAL 10/20/22   Jacky Kindle, FNP  primidone (MYSOLINE) 50 MG tablet Take 0.5 tablets (25 mg total) by mouth 2 (two) times daily. 07/01/22   Marrion Coy, MD  rosuvastatin (CRESTOR) 10 MG tablet TAKE 1 TABLET (10 MG TOTAL) BY MOUTH DAILY. 06/07/23   Jacky Kindle, FNP  traMADol (ULTRAM) 50 MG tablet Take 50 mg by mouth 2 (two) times daily as needed for moderate pain. 09/29/22   [provider]    Family History  Problem Relation Age of Onset   Lung cancer Mother    Alcohol abuse Sister    Arthritis Sister    Alcohol abuse Brother    Heart disease Brother 65   Heart attack Brother    Pancreatic cancer Maternal Aunt    Diabetes Maternal Aunt    Alcohol abuse Son    Drug  abuse Son    Drug abuse Son    Heart disease Father 34   Diabetes Sister    Breast cancer Brother    Alcohol abuse Maternal Uncle    Alcohol abuse Maternal Grandmother    Diabetes Maternal Grandmother      Social History   Tobacco Use   Smoking status: Every Day    Current packs/day: 2.00    Average packs/day: 2.0 packs/day for 45.1 years (90.1 ttl pk-yrs)    Types: Cigarettes    Start date: 03/02/1980   Smokeless tobacco: Never   Tobacco comments:    1.5PPD 05/16/2022  Vaping Use   Vaping status: Former   Devices: only for a month. did not like  Substance Use Topics   Alcohol use: No    Alcohol/week: 0.0 standard drinks of alcohol   Drug use: No    Allergies as of 06/08/2023 - Review Complete 06/08/2023  Allergen Reaction Noted   Metformin and related Diarrhea 05/03/2023   Dexilant [dexlansoprazole] Nausea Only and Other (See Comments) 07/28/2016    Review of Systems:    All systems reviewed and negative except where noted in HPI.   Physical Exam:  BP 135/73 (BP Location: Right Arm, Patient Position: Sitting, Cuff Size: Normal)   Pulse 73   Temp 98.2 F (36.8 C) (Oral)   Ht 5\' 11"  (1.803 m)   Wt 219 lb (99.3 kg)   BMI 30.54 kg/m  No LMP recorded. Patient is postmenopausal.  General:   Alert,  Well-developed, well-nourished, pleasant and cooperative in NAD Lungs:  Respirations even and unlabored.  Clear throughout to auscultation.   No wheezes, crackles, or rhonchi. No acute distress. Heart:  Regular rate and rhythm; no murmurs, clicks, rubs, or gallops. Abdomen:  Normal bowel sounds.  No bruits.  Soft, and non-distended without masses, hepatosplenomegaly or hernias noted.  No Tenderness.  No guarding or rebound tenderness.    Neurologic:  Alert and oriented x3;  grossly normal neurologically. Psych:  Alert and cooperative. Normal mood and affect.  Imaging Studies: No results found.  Assessment and Plan:   Linda Bradford is a 67 y.o. y/o female  has been  referred for diarrhea.  Metformin was recently held.  Previous cholecystectomy and sigmoid colectomy in 2020.  3-year repeat colonoscopy is overdue due to previous history of colon polyps.  1.  Diarrhea - Resolved off Metformin  Remain off metformin.  No further GI workup is needed at this time.  Happy to see her back if she has recurrent diarrhea in the future.  2.  History of adenomatous colon polyps Scheduling Colonoscopy I discussed risks of colonoscopy with patient to include risk of bleeding, colon perforation, and risk of sedation.  Patient expressed understanding and agrees to proceed with colonoscopy.   Follow up as needed based on colonoscopy results and GI symptoms.  Celso Amy, PA-C

## 2023-06-08 ENCOUNTER — Telehealth: Payer: Self-pay

## 2023-06-08 ENCOUNTER — Ambulatory Visit: Payer: Medicare PPO | Admitting: Physician Assistant

## 2023-06-08 ENCOUNTER — Other Ambulatory Visit: Payer: Self-pay

## 2023-06-08 ENCOUNTER — Encounter: Payer: Self-pay | Admitting: Physician Assistant

## 2023-06-08 VITALS — BP 135/73 | HR 73 | Temp 98.2°F | Ht 71.0 in | Wt 219.0 lb

## 2023-06-08 DIAGNOSIS — Z09 Encounter for follow-up examination after completed treatment for conditions other than malignant neoplasm: Secondary | ICD-10-CM | POA: Diagnosis not present

## 2023-06-08 DIAGNOSIS — Z860101 Personal history of adenomatous and serrated colon polyps: Secondary | ICD-10-CM

## 2023-06-08 DIAGNOSIS — Z860100 Personal history of colon polyps, unspecified: Secondary | ICD-10-CM

## 2023-06-08 DIAGNOSIS — R195 Other fecal abnormalities: Secondary | ICD-10-CM

## 2023-06-08 MED ORDER — GOLYTELY 236 G PO SOLR: 4000.0000 mL | Freq: Once | ORAL | 0 refills | Status: AC

## 2023-06-08 NOTE — Telephone Encounter (Signed)
   [facility] Pre-operative Risk Assessment    Request for surgical clearance:  What type of surgery is being performed? Colonoscopy   When is this surgery scheduled? 07/05/2023   Are there any medications that need to be held prior to surgery and how long?None    Practice name and name of physician performing surgery?  Gastroenterology    What is your office phone and fax number? [phone]   Anesthesia type (None, local, MAC, general) ? General    Denman George 06/08/2023, 3:40 PM  _________________________________________________________________   (provider comments below)

## 2023-06-08 NOTE — Telephone Encounter (Signed)
   Name: Linda Bradford  DOB: Feb 02, 1956  MRN: [id number]  Primary Cardiologist: None  Chart reviewed as part of pre-operative protocol coverage. The patient has an upcoming visit scheduled with Dr. Mariah Milling on 06/13/23 at which time clearance can be addressed in case there are any issues that would impact surgical recommendations.  I added preop FYI to appointment note so that provider is aware to address at time of outpatient visit.  Per office protocol the cardiology provider should forward their finalized clearance decision and recommendations regarding antiplatelet therapy to the requesting party below.     I will route this message as FYI to requesting party and remove this message from the preop box as separate preop APP input not needed at this time.   Please call with any questions.  Napoleon Form, Leodis Rains, NP  06/08/2023, 3:43 PM

## 2023-06-13 ENCOUNTER — Encounter: Payer: Self-pay | Admitting: Cardiovascular Disease

## 2023-06-13 ENCOUNTER — Ambulatory Visit: Payer: Medicare PPO | Attending: Cardiovascular Disease | Admitting: Cardiovascular Disease

## 2023-06-13 VITALS — BP 128/58 | HR 81 | Ht 70.0 in | Wt 223.1 lb

## 2023-06-13 DIAGNOSIS — J432 Centrilobular emphysema: Secondary | ICD-10-CM

## 2023-06-13 DIAGNOSIS — R0989 Other specified symptoms and signs involving the circulatory and respiratory systems: Secondary | ICD-10-CM | POA: Diagnosis not present

## 2023-06-13 DIAGNOSIS — Z87891 Personal history of nicotine dependence: Secondary | ICD-10-CM

## 2023-06-13 DIAGNOSIS — I2510 Atherosclerotic heart disease of native coronary artery without angina pectoris: Secondary | ICD-10-CM | POA: Diagnosis not present

## 2023-06-13 DIAGNOSIS — E782 Mixed hyperlipidemia: Secondary | ICD-10-CM

## 2023-06-13 DIAGNOSIS — I700 Atherosclerosis of aorta: Secondary | ICD-10-CM | POA: Diagnosis not present

## 2023-06-13 DIAGNOSIS — I359 Nonrheumatic aortic valve disorder, unspecified: Secondary | ICD-10-CM

## 2023-06-13 DIAGNOSIS — Z79899 Other long term (current) drug therapy: Secondary | ICD-10-CM

## 2023-06-13 MED ORDER — EZETIMIBE 10 MG PO TABS: 10.0000 mg | ORAL_TABLET | Freq: Every day | ORAL | 3 refills | Status: DC

## 2023-06-13 NOTE — Patient Instructions (Addendum)
Medication Instructions:  Please start zetia 10 mg daily  If you need a refill on your cardiac medications before your next appointment, please call your pharmacy.   Lab work: No new labs needed  Testing/Procedures: Your physician has requested that you have a carotid duplex. This test is an ultrasound of the carotid arteries in your neck. It looks at blood flow through these arteries that supply the brain with blood.   Allow one hour for this exam.  There are no restrictions or special instructions.  This will take place at [address] [facility] [address] [facility]) [address], [place] [address]  Please note: We ask at that you not bring children with you during ultrasound (echo/ vascular) testing. Due to room size and safety concerns, children are not allowed in the ultrasound rooms during exams. Our front office staff cannot provide observation of children in our lobby area while testing is being conducted. An adult accompanying a patient to their appointment will only be allowed in the ultrasound room at the discretion of the ultrasound technician under special circumstances. We apologize for any inconvenience.   Follow-Up: At [facility], you and your health needs are our priority.  As part of our continuing mission to provide you with exceptional heart care, we have created designated Provider Care Teams.  These Care Teams include your primary Cardiologist (physician) and Advanced Practice Providers (APPs -  Physician Assistants and Nurse Practitioners) who all work together to provide you with the care you need, when you need it.  You will need a follow up appointment as needed  Providers on your designated Care Team:   Nicolasa Ducking, NP Eula Listen, PA-C Cadence Fransico Michael, [place]  COVID-19 Vaccine Information can be found at: [email or web address] For questions related to vaccine distribution or appointments, please email  [email or web address] or call [phone].

## 2023-06-13 NOTE — Progress Notes (Signed)
Cardiology Office Note  Date:  06/13/2023   ID:  Faraday Balster, DOB 15-Aug-1955, MRN [id number]  PCP:  Jacky Kindle, FNP   Chief Complaint  Patient presents with   New Patient (Initial Visit)    Pre-op clearance for a colonoscopy. Patient c/o palpitations, chest discomfort and shortness of breath. Medications reviewed by the patient verbally.     HPI:  [name] is a 67 year old woman with past medical history of Diabetes Hypertension COPD/smoker Who presents by referral from [facility] for aortic valve calcification, aortic atherosclerosis  Discussed recent events Lost husband, He had numerous medical issues including PAD, carotid, retinal occlusion, CHF, malnutrition, depression Since his passing, she has Had grief response, Will occasionally feel chest fullness, feels it is associated with grief, typically resolves without intervention  Reports having significant stress at home, Lives with two sons with disabilities, do not get along, yelling at her, one in recovery Self medicate, drugs, 1 goes to AA meetings  CT chest May 2024 Images pulled up and reviewed Mild diffuse aortic atherosclerosis Mild coronary calcification in the proximal LAD and RCA Mild aortic valve calcification, mitral annular calcification  CT scan abdomen pelvis/lumbar spine in 2023 with mild to moderate aortic atherosclerosis Images pulled up and reviewed  Echo ordered but not completed Remote echo June 2018 normal ejection fraction, mild to moderate LVH, mild AI  Lab work reviewed A1c 7.1 Total cholesterol 169 LDL 83 on Crestor 10  EKG personally reviewed by myself on todays visit EKG Interpretation Date/Time:  Tuesday June 13 2023 16:07:19 EST Ventricular Rate:  81 PR Interval:  160 QRS Duration:  82 QT Interval:  370 QTC Calculation: 429 R Axis:   10  Text Interpretation: Normal sinus rhythm Normal ECG When compared with ECG of 31-Oct-2022 11:21, No significant change was  found Confirmed by Julien Nordmann [phone]) on 06/13/2023 4:18:26 PM    PMH:   has a past medical history of Anemia, Anxiety, Arthritis, Cancer (HCC), COPD (chronic obstructive pulmonary disease) (HCC), Depression, Diabetes mellitus without complication (HCC), Disordered sleep (01/13/2014), Dyspnea, Dysrhythmia, Fatigue, GERD (gastroesophageal reflux disease), Headache, Heart murmur, Hypertension, Hypothyroidism, Pneumonia, Thyroid disease, and Tremors of nervous system.  PSH:    Past Surgical History:  Procedure Laterality Date   APPENDECTOMY     CHOLECYSTECTOMY N/A 07/01/2019   Procedure: LAPAROSCOPIC CHOLECYSTECTOMY;  Surgeon: Henrene Dodge, MD;  Location: [facility];  Service: General;  Laterality: N/A;   COLECTOMY WITH COLOSTOMY CREATION/HARTMANN PROCEDURE N/A 09/03/2018   Error, this was not done.   COLONOSCOPY WITH PROPOFOL N/A 03/08/2019   Procedure: COLONOSCOPY WITH PROPOFOL;  Surgeon: Pasty Spillers, MD;  Location: [facility];  Service: Endoscopy;  Laterality: N/A;   CYSTOSCOPY WITH STENT PLACEMENT Bilateral 09/03/2018   Procedure: CYSTOSCOPY WITH STENT PLACEMENT-LIGHTED STENTS;  Surgeon: Vanna Scotland, MD;  Location: [facility];  Service: Urology;  Laterality: Bilateral;   LAPAROSCOPIC SIGMOID COLECTOMY N/A 09/03/2018   Procedure: LAPAROSCOPIC SIGMOID COLECTOMY;  Surgeon: Henrene Dodge, MD;  Location: [facility];  Service: General;  Laterality: N/A;   percutaneous drainage tube  07/2018   2nd tube placed. not healing in colon, causing a fistula   TONSILLECTOMY     TOTAL KNEE ARTHROPLASTY Left 05/10/2021   Procedure: TOTAL KNEE ARTHROPLASTY;  Surgeon: Lyndle Herrlich, MD;  Location: [facility];  Service: Orthopedics;  Laterality: Left;   TOTAL KNEE ARTHROPLASTY Right 01/10/2022   Procedure: TOTAL KNEE ARTHROPLASTY;  Surgeon: Lyndle Herrlich, MD;  Location: [facility];  Service: Orthopedics;  Laterality: Right;   TUBAL LIGATION      Current Outpatient Medications  Medication Sig  Dispense Refill   acetaminophen (TYLENOL) 500 MG tablet Take 1,000 mg by mouth every 6 (six) hours as needed for mild pain or moderate pain.      albuterol (VENTOLIN HFA) 108 (90 Base) MCG/ACT inhaler Inhale into the lungs.     amLODipine (NORVASC) 5 MG tablet TAKE 1 TABLET(5 MG) BY MOUTH DAILY 90 tablet 1   aspirin 81 MG chewable tablet Chew 1 tablet (81 mg total) by mouth 2 (two) times daily. 60 tablet 0   blood glucose meter kit and supplies Dispense based on patient and insurance preference. Once daily. (FOR ICD-10 E10.9, E11.9). 1 each 0   buPROPion (WELLBUTRIN XL) 300 MG 24 hr tablet Take 1 tablet (300 mg total) by mouth daily. 90 tablet 3   Cholecalciferol (VITAMIN D) 50 MCG (2000 UT) tablet Take 2,000 Units by mouth daily.     diclofenac Sodium (VOLTAREN) 1 % GEL      DULoxetine (CYMBALTA) 60 MG capsule Take 1 capsule (60 mg total) by mouth 2 (two) times daily. 180 capsule 1   fluticasone (FLONASE) 50 MCG/ACT nasal spray USE 2 SPRAYS INTO BOTH NOSTRILS DAILY. 48 g 1   Fluticasone-Umeclidin-Vilant (TRELEGY ELLIPTA) 100-62.5-25 MCG/ACT AEPB Inhale 1 puff into the lungs daily. 1 each 11   gabapentin (NEURONTIN) 300 MG capsule Take 1 capsule (300 mg total) by mouth 2 (two) times daily. Take 2 capsules PO every morning; Take 3 capsules PO every evening. 60 capsule 1   glucose blood (CONTOUR NEXT TEST) test strip To check blood sugar once daily 100 each 12   ipratropium-albuterol (DUONEB) 0.5-2.5 (3) MG/3ML SOLN Take 3 mLs by nebulization every 6 (six) hours as needed. 360 mL 11   levothyroxine (SYNTHROID) 75 MCG tablet Take 1 tablet (75 mcg total) by mouth daily before breakfast. 90 tablet 3   loperamide (IMODIUM A-D) 2 MG tablet Take 2-4 mg by mouth 4 (four) times daily as needed for diarrhea or loose stools.     meloxicam (MOBIC) 15 MG tablet Take 1 tablet (15 mg total) by mouth daily. 90 tablet 1   metoCLOPramide (REGLAN) 10 MG tablet TAKE 1 TABLET(10 MG) BY MOUTH EVERY 8 HOURS AS NEEDED FOR  NAUSEA 90 tablet 0   metoprolol succinate (TOPROL-XL) 50 MG 24 hr tablet Take 1 tablet (50 mg total) by mouth daily. 90 tablet 3   montelukast (SINGULAIR) 10 MG tablet Take 1 tablet (10 mg total) by mouth at bedtime. 90 tablet 1   nystatin (MYCOSTATIN) 100000 UNIT/ML suspension Take 5 mLs (500,000 Units total) by mouth 4 (four) times daily. 473 mL 0   nystatin (MYCOSTATIN/NYSTOP) powder Apply 1 application topically 3 (three) times daily. 60 g 0   oxybutynin (DITROPAN-XL) 5 MG 24 hr tablet Take 1 tablet (5 mg total) by mouth at bedtime. 90 tablet 5   pantoprazole (PROTONIX) 20 MG tablet TAKE 1 TABLET(20 MG) BY MOUTH TWICE DAILY BEFORE A MEAL 180 tablet 0   primidone (MYSOLINE) 50 MG tablet Take 0.5 tablets (25 mg total) by mouth 2 (two) times daily.     rosuvastatin (CRESTOR) 10 MG tablet TAKE 1 TABLET (10 MG TOTAL) BY MOUTH DAILY. 90 tablet 3   traMADol (ULTRAM) 50 MG tablet Take 50 mg by mouth 2 (two) times daily as needed for moderate pain.     No current facility-administered medications for this visit.     Allergies:  Metformin and related and Dexilant [dexlansoprazole]   Social History:  The patient  reports that she has been smoking cigarettes. She started smoking about 43 years ago. She has a 90.2 pack-year smoking history. She has never used smokeless tobacco. She reports that she does not drink alcohol and does not use drugs.   Family History:   family history includes Alcohol abuse in her brother, maternal grandmother, maternal uncle, sister, and son; Arthritis in her sister; Breast cancer in her brother; Diabetes in her maternal aunt, maternal grandmother, and sister; Drug abuse in her son and son; Heart attack in her brother; Heart disease (age of onset: 21) in her father; Heart disease (age of onset: 41) in her brother; Lung cancer in her mother; Pancreatic cancer in her maternal aunt.    Review of Systems: Review of Systems  Constitutional: Negative.   HENT: Negative.     Respiratory: Negative.    Cardiovascular: Negative.   Gastrointestinal: Negative.   Musculoskeletal: Negative.   Neurological: Negative.   Psychiatric/Behavioral: Negative.    All other systems reviewed and are negative.    PHYSICAL EXAM: VS:  BP (!) 128/58 (BP Location: Right Arm, Patient Position: Sitting, Cuff Size: Normal)   Pulse 81   Ht 5\' 10"  (1.778 m)   Wt 223 lb 2 oz (101.2 kg)   SpO2 95%   BMI 32.02 kg/m  , BMI Body mass index is 32.02 kg/m. GEN: Well nourished, well developed, in no acute distress HEENT: normal Neck: no JVD, 1+ right carotid bruit, no masses Cardiac: RRR; no murmurs, rubs, or gallops,no edema  Respiratory:  clear to auscultation bilaterally, normal work of breathing GI: soft, nontender, nondistended, + BS MS: no deformity or atrophy Skin: warm and dry, no rash Neuro:  Strength and sensation are intact Psych: euthymic mood, full affect   Recent Labs: 06/26/2022: B Natriuretic Peptide 20.2 07/01/2022: Magnesium 2.1 01/18/2023: ALT 18; BUN 11; Creatinine, Ser 0.68; Hemoglobin 14.0; Platelets 288; Potassium 4.3; Sodium 140; TSH 1.120    Lipid Panel Lab Results  Component Value Date   CHOL 169 12/07/2021   HDL 60 12/07/2021   LDLCALC 83 12/07/2021   TRIG 149 12/07/2021      Wt Readings from Last 3 Encounters:  06/13/23 223 lb 2 oz (101.2 kg)  06/08/23 219 lb (99.3 kg)  05/03/23 216 lb (98 kg)       ASSESSMENT AND PLAN:  Problem List Items Addressed This Visit       Cardiology Problems   Aortic valve calcification   Relevant Orders   EKG 12-Lead (Completed)   Aortic atherosclerosis (HCC) - Primary   Relevant Orders   EKG 12-Lead (Completed)     Other   Centrilobular emphysema (HCC)   Relevant Orders   EKG 12-Lead (Completed)   Other Visit Diagnoses     Mixed hyperlipidemia       History of smoking          Coronary calcification Mild coronary calcification of the LAD, RCA, denies anginal symptoms Recent chest  fullness likely secondary to stress after loss of her husband, also with significant stress at home as detailed above For worsening symptoms of chest pain, could consider cardiac CTA  Aortic atherosclerosis Mild diffuse disease, smoking cessation recommended We will add Zetia to her Crestor to achieve goal LDL less than 70  Hyperlipidemia Continue Crestor, add Zetia 10 daily  Smoker We have encouraged her to continue to work on weaning her cigarettes and smoking cessation. She  will continue to work on this and does not want any assistance with chantix.    Aortic valve calcification Minimal calcification on CT scan No significant murmur on exam warranting repeat echo  Carotid bruit on right Carotid ultrasound ordered   Signed, Dossie Arbour, M.D., Ph.D. [facility] [place], [place] [phone]

## 2023-06-14 ENCOUNTER — Other Ambulatory Visit: Payer: Self-pay | Admitting: Family Medicine

## 2023-06-14 DIAGNOSIS — Z9109 Other allergy status, other than to drugs and biological substances: Secondary | ICD-10-CM

## 2023-06-20 ENCOUNTER — Other Ambulatory Visit: Payer: Self-pay | Admitting: Family Medicine

## 2023-06-26 ENCOUNTER — Ambulatory Visit
Admission: RE | Admit: 2023-06-26 | Discharge: 2023-06-26 | Disposition: A | Discharge status: Home / Self Care | Payer: Medicare PPO | Source: Ambulatory Visit | Attending: Acute Care | Admitting: Acute Care

## 2023-06-26 DIAGNOSIS — F17210 Nicotine dependence, cigarettes, uncomplicated: Secondary | ICD-10-CM | POA: Diagnosis not present

## 2023-06-26 DIAGNOSIS — R911 Solitary pulmonary nodule: Secondary | ICD-10-CM | POA: Insufficient documentation

## 2023-06-28 ENCOUNTER — Ambulatory Visit: Payer: Self-pay

## 2023-06-28 ENCOUNTER — Telehealth: Payer: Self-pay | Admitting: Family Medicine

## 2023-06-28 NOTE — Telephone Encounter (Signed)
Referral Request - Did the patient discuss referral with their provider in the last year? Yes (If No - schedule appointment) (If Yes - send message)  Appointment offered? No  Type of order/referral and detailed reason for visit: pt states her left big toe is hurting, throbbing and she is a diabetic.  Preference of office, provider, location: any  If referral order, have you been seen by this specialty before? No (If Yes, this issue or another issue? When? Where?  Can we respond through MyChart? Yes Pt ask to leave a message today. But this is an urgent referral.

## 2023-06-28 NOTE — Telephone Encounter (Signed)
Left detailed message per DPR. Ok for [facility] Nurse to advise

## 2023-06-28 NOTE — Telephone Encounter (Signed)
Chief Complaint: Toe Pain Symptoms: Left great toe pain 10/10 Frequency: constant Pertinent Negatives: Patient denies redness, swollen, joint pain Disposition: [] ED /[] Urgent Care (no appt availability in office) / [x] Appointment(In office/virtual)/ []  [place] [facility]/ [] Home Care/ [] Refused Recommended Disposition /[] [place] [facility]/ []  Follow-up with PCP Additional Notes: Patient states she stubbed her toe on a shopping cart a few days ago and now her left big toe is hurting, pain 10/10. Patient states she is taking her pain medication and the pain is not improving. Patient states she has not been seen for this toe pain and would like a referral for toe pain. Care advice was given and patient has been scheduled for an appointment on Monday. Patient stated she will try soaking the toe in epsom salt to see if improves the discomfort. Reason for Disposition  [1] SEVERE pain (e.g., excruciating, unable to do any normal activities) AND [2] not improved after 2 hours of pain medicine  Answer Assessment - Initial Assessment Questions 1. ONSET: "When did the pain start?"      2 days ago  2. LOCATION: "Where is the pain located?"      Left Great toe  3. PAIN: "How bad is the pain?"    (Scale 1-10; or mild, moderate, severe)  - MILD (1-3): doesn't interfere with normal activities.   - MODERATE (4-7): interferes with normal activities (e.g., work or school) or awakens from sleep, limping.   - SEVERE (8-10): excruciating pain, unable to do any normal activities, unable to walk.      10/10 4. WORK OR EXERCISE: "Has there been any recent work or exercise that involved this part of the body?"      No 5. CAUSE: "What do you think is causing the foot pain?"     I stubbed my toe on a shopping cart  6. OTHER SYMPTOMS: "Do you have any other symptoms?" (e.g., leg pain, rash, fever, numbness)     None  Protocols used: Foot Pain-A-AH

## 2023-06-30 ENCOUNTER — Other Ambulatory Visit: Payer: Self-pay | Admitting: Family Medicine

## 2023-06-30 DIAGNOSIS — E1159 Type 2 diabetes mellitus with other circulatory complications: Secondary | ICD-10-CM

## 2023-07-03 ENCOUNTER — Ambulatory Visit: Payer: Medicare PPO | Admitting: Family Medicine

## 2023-07-03 ENCOUNTER — Encounter: Payer: Self-pay | Admitting: Family Medicine

## 2023-07-03 VITALS — BP 136/56 | HR 72 | Resp 16 | Ht 70.0 in | Wt 225.0 lb

## 2023-07-03 DIAGNOSIS — F32A Depression, unspecified: Secondary | ICD-10-CM

## 2023-07-03 DIAGNOSIS — E1149 Type 2 diabetes mellitus with other diabetic neurological complication: Secondary | ICD-10-CM

## 2023-07-03 DIAGNOSIS — M79675 Pain in left toe(s): Secondary | ICD-10-CM | POA: Diagnosis not present

## 2023-07-03 MED ORDER — DULOXETINE HCL 60 MG PO CPEP: 60.0000 mg | ORAL_CAPSULE | Freq: Two times a day (BID) | ORAL | 1 refills | Status: DC

## 2023-07-03 NOTE — Progress Notes (Signed)
Acute Office Visit Introduced to nurse practitioner role and practice setting.  All questions answered.  Discussed provider/patient relationship and expectations.  Subjective:     Patient ID: Linda Bradford, female    DOB: 1955/10/12, 67 y.o.   MRN: [id number]  Chief Complaint  Patient presents with   Toe Pain    Pt presents with L great toe pain for 1.5 weeks. Stubbed toe on cabinet in kitchen two weekends ago and over past weekend dropped a tire on it. She is having acute pain on tip of great toe and on top of L great toe nail bed. Denies any sensation changes, drainage, bleeding, swelling, bruising. She is able to ambulate. She can were her orthopedic shoes with thin socks, as they do not rub on toe. She is managing the pain with tylenol, her prescribed tramadol, and Voltaren gel on great toe joint. She is concerned with her hx of diabetes and foot neuropathy about the healing time. She checks her feet for wounds daily and keeps feet cleanly. States elevating foot and rest helps alleviate pain. Pain is worse with touch, but she is currently in a 1/10 pain during office visit.  While here she needed a refill on her Cymbalta - will reorder.   ROS  Negative unless stated in HPI.      Objective:    BP (!) 136/56   Pulse 72   Resp 16   Ht 5\' 10"  (1.778 m)   Wt 225 lb (102.1 kg)   SpO2 94%   BMI 32.28 kg/m    Physical Exam Cardiovascular:     Pulses:          Dorsalis pedis pulses are 2+ on the right side and 2+ on the left side.       Posterior tibial pulses are 2+ on the right side and 2+ on the left side.  Musculoskeletal:     Right foot: Normal range of motion. No deformity, bunion or Charcot foot.     Left foot: Normal range of motion. No deformity, bunion or Charcot foot.       Feet:  Feet:     Right foot:     Skin integrity: Dry skin present.     Toenail Condition: Right toenails are abnormally thick.     Left foot:     Skin integrity: Erythema and dry skin present.  No ulcer, blister, skin breakdown, warmth, callus or fissure.     Toenail Condition: Left toenails are abnormally thick and ingrown.     Comments: Ingrown toenail L great toe. L foot normal temperature.  Tenderness to palpation of distal phalanx of L great toe and anterior nail bed tenderness elicited with palpation. Erythema and scant edema at distal L great toe phalanx. Pain 5/10 with palpitation, otherwise pain is 1/10 at visit.   Baseline has neuropathy in bilateral feet - pt states sensation at visit is her baseline.   No results found for any visits on 07/03/23.      Assessment & Plan:   Problem List Items Addressed This Visit       Endocrine   Diabetes mellitus type 2 with neurological manifestations (HCC)    Pt concerned for toe nail changes and general toe structure changes. She requested a referral to podiatry for assessment given her diabetic foot neuropathy history. Lab Results  Component Value Date   HGBA1C 7.1 (A) 05/03/2023   HGBA1C 7.3 (H) 01/18/2023   HGBA1C 6.8 (A) 10/06/2022  Relevant Orders   Ambulatory referral to Podiatry     Other   Great toe pain, left - Primary    Toe Fracture vs. Soft tissue injury Pt presented with acute L great To pain d/t trauma related to stubbing toe and dropping object on toe.  Pain is managed with at home Tylenol, voltaren gel on toe joint, and her prescribed Tramadol 50mg  BID prn - she is happy with this and does not need any additional pain management.  Does not appear to be infected.  While there is no bruising, sensation changes, pain is managed, she is able to ambulate, and foot/toe is perfusing well, given the pain has continued for greater then one week since original inicident, continued erythema, and history of diabetic foot neuropathy will order L great Toe x-ray to rule out any fracture - will communicate results.Imaging routine - per pt will get tomorrow  Discussed and educated on toe buddy taping - pt  declined in office taping.  Elevate at home and ice affected toe.  Rest as able.  Pending results discussed need for ortho referral.  Wear supportive shoes and be aware of surrounding when ambulating.  Discussed depending injury could take upwards of 4-6 weeks for pain to subside.       Relevant Orders   DG Toe Great Left   Other Visit Diagnoses     Depression, unspecified depression type       Relevant Medications   DULoxetine (CYMBALTA) 60 MG capsule      Pt requested refill on her Cymbalta - reordered.   Meds ordered this encounter  Medications   DULoxetine (CYMBALTA) 60 MG capsule    Sig: Take 1 capsule (60 mg total) by mouth 2 (two) times daily.    Dispense:  180 capsule    Refill:  1    Return in about 3 months (around 10/01/2023) for as needed for chronic disease mgmt or if toe pain worsens..  Pt has scheduled visit with PCP on 08/02/22.  Sallee Provencal, FNP

## 2023-07-03 NOTE — Assessment & Plan Note (Addendum)
Toe Fracture vs. Soft tissue injury Pt presented with acute L great To pain d/t trauma related to stubbing toe and dropping object on toe.  Pain is managed with at home Tylenol, voltaren gel on toe joint, and her prescribed Tramadol 50mg  BID prn - she is happy with this and does not need any additional pain management.  Does not appear to be infected.  While there is no bruising, sensation changes, pain is managed, she is able to ambulate, and foot/toe is perfusing well, given the pain has continued for greater then one week since original inicident, continued erythema, and history of diabetic foot neuropathy will order L great Toe x-ray to rule out any fracture - will communicate results.Imaging routine - per pt will get tomorrow  Discussed and educated on toe buddy taping - pt declined in office taping.  Elevate at home and ice affected toe.  Rest as able.  Pending results discussed need for ortho referral.  Wear supportive shoes and be aware of surrounding when ambulating.  Discussed depending injury could take upwards of 4-6 weeks for pain to subside.

## 2023-07-03 NOTE — Telephone Encounter (Signed)
Patient seen on 07/03/23 by Ardean Larsen for this problem.

## 2023-07-03 NOTE — Assessment & Plan Note (Addendum)
Pt concerned for toe nail changes and general toe structure changes. She requested a referral to podiatry for assessment given her diabetic foot neuropathy history. Last A1C 7.1 on 05/03/23. Lab Results  Component Value Date   HGBA1C 7.1 (A) 05/03/2023   HGBA1C 7.3 (H) 01/18/2023   HGBA1C 6.8 (A) 10/06/2022

## 2023-07-04 ENCOUNTER — Encounter: Payer: Self-pay | Admitting: Gastroenterology

## 2023-07-04 NOTE — Telephone Encounter (Signed)
Requested Prescriptions  Pending Prescriptions Disp Refills   amLODipine (NORVASC) 5 MG tablet [Pharmacy Med Name: AMLODIPINE BESYLATE 5MG  TABLETS] 90 tablet 1    Sig: TAKE 1 TABLET(5 MG) BY MOUTH DAILY     Cardiovascular: Calcium Channel Blockers 2 Passed - 06/30/2023 10:06 AM      Passed - Last BP in normal range    BP Readings from Last 1 Encounters:  07/03/23 (!) 136/56         Passed - Last Heart Rate in normal range    Pulse Readings from Last 1 Encounters:  07/03/23 72         Passed - Valid encounter within last 6 months    Recent Outpatient Visits           Yesterday Great toe pain, left   [facility] [place] [place] A, FNP   2 months ago Diarrhea due to drug   [facility] T, FNP   5 months ago Diabetes mellitus type 2 with neurological manifestations [facility])   [place] [facility] T, FNP   8 months ago Prolonged grief disorder   [facility] Jacky Kindle, FNP   8 months ago Hallucinations   [facility] Jacky Kindle, FNP       Future Appointments             In 1 month Suzie Portela, Daryl Eastern, FNP [facility], [facility]

## 2023-07-05 ENCOUNTER — Ambulatory Visit: Payer: Medicare PPO | Admitting: Anesthesiology

## 2023-07-05 ENCOUNTER — Encounter
Admission: RE | Disposition: A | Discharge status: Home / Self Care | Payer: Self-pay | Source: Home / Self Care | Attending: Gastroenterology

## 2023-07-05 ENCOUNTER — Encounter: Payer: Self-pay | Admitting: Gastroenterology

## 2023-07-05 ENCOUNTER — Ambulatory Visit
Admission: RE | Admit: 2023-07-05 | Discharge: 2023-07-05 | Disposition: A | Discharge status: Home / Self Care | Payer: Medicare PPO | Attending: Gastroenterology | Admitting: Gastroenterology

## 2023-07-05 DIAGNOSIS — Z1211 Encounter for screening for malignant neoplasm of colon: Secondary | ICD-10-CM | POA: Insufficient documentation

## 2023-07-05 DIAGNOSIS — E039 Hypothyroidism, unspecified: Secondary | ICD-10-CM | POA: Insufficient documentation

## 2023-07-05 DIAGNOSIS — E119 Type 2 diabetes mellitus without complications: Secondary | ICD-10-CM | POA: Insufficient documentation

## 2023-07-05 DIAGNOSIS — F419 Anxiety disorder, unspecified: Secondary | ICD-10-CM | POA: Diagnosis not present

## 2023-07-05 DIAGNOSIS — D124 Benign neoplasm of descending colon: Secondary | ICD-10-CM | POA: Diagnosis not present

## 2023-07-05 DIAGNOSIS — M797 Fibromyalgia: Secondary | ICD-10-CM | POA: Insufficient documentation

## 2023-07-05 DIAGNOSIS — K635 Polyp of colon: Secondary | ICD-10-CM

## 2023-07-05 DIAGNOSIS — K219 Gastro-esophageal reflux disease without esophagitis: Secondary | ICD-10-CM | POA: Diagnosis not present

## 2023-07-05 DIAGNOSIS — K5730 Diverticulosis of large intestine without perforation or abscess without bleeding: Secondary | ICD-10-CM | POA: Diagnosis not present

## 2023-07-05 DIAGNOSIS — Z833 Family history of diabetes mellitus: Secondary | ICD-10-CM | POA: Diagnosis not present

## 2023-07-05 DIAGNOSIS — Z79899 Other long term (current) drug therapy: Secondary | ICD-10-CM | POA: Diagnosis not present

## 2023-07-05 DIAGNOSIS — Z980 Intestinal bypass and anastomosis status: Secondary | ICD-10-CM | POA: Insufficient documentation

## 2023-07-05 DIAGNOSIS — J449 Chronic obstructive pulmonary disease, unspecified: Secondary | ICD-10-CM | POA: Insufficient documentation

## 2023-07-05 DIAGNOSIS — D120 Benign neoplasm of cecum: Secondary | ICD-10-CM | POA: Diagnosis not present

## 2023-07-05 DIAGNOSIS — Z860100 Personal history of colon polyps, unspecified: Secondary | ICD-10-CM | POA: Diagnosis not present

## 2023-07-05 DIAGNOSIS — R519 Headache, unspecified: Secondary | ICD-10-CM | POA: Diagnosis not present

## 2023-07-05 DIAGNOSIS — F32A Depression, unspecified: Secondary | ICD-10-CM | POA: Diagnosis not present

## 2023-07-05 DIAGNOSIS — R0602 Shortness of breath: Secondary | ICD-10-CM | POA: Diagnosis not present

## 2023-07-05 DIAGNOSIS — I10 Essential (primary) hypertension: Secondary | ICD-10-CM | POA: Diagnosis not present

## 2023-07-05 DIAGNOSIS — F17210 Nicotine dependence, cigarettes, uncomplicated: Secondary | ICD-10-CM | POA: Insufficient documentation

## 2023-07-05 DIAGNOSIS — M1990 Unspecified osteoarthritis, unspecified site: Secondary | ICD-10-CM | POA: Diagnosis not present

## 2023-07-05 DIAGNOSIS — Z79890 Hormone replacement therapy (postmenopausal): Secondary | ICD-10-CM | POA: Insufficient documentation

## 2023-07-05 DIAGNOSIS — Z791 Long term (current) use of non-steroidal anti-inflammatories (NSAID): Secondary | ICD-10-CM | POA: Diagnosis not present

## 2023-07-05 DIAGNOSIS — D123 Benign neoplasm of transverse colon: Secondary | ICD-10-CM | POA: Insufficient documentation

## 2023-07-05 DIAGNOSIS — Z7982 Long term (current) use of aspirin: Secondary | ICD-10-CM | POA: Diagnosis not present

## 2023-07-05 HISTORY — PX: POLYPECTOMY: SHX5525

## 2023-07-05 HISTORY — PX: COLONOSCOPY WITH PROPOFOL: SHX5780

## 2023-07-05 SURGERY — COLONOSCOPY WITH PROPOFOL
Anesthesia: General

## 2023-07-05 MED ORDER — SODIUM CHLORIDE 0.9 % IV SOLN: INTRAVENOUS | Status: DC

## 2023-07-05 MED ORDER — PHENYLEPHRINE HCL-NACL 20-0.9 MG/250ML-% IV SOLN
INTRAVENOUS | Status: AC
  Filled 2023-07-05: qty 250

## 2023-07-05 MED ORDER — MIDAZOLAM HCL 2 MG/2ML IJ SOLN
INTRAMUSCULAR | Status: AC
  Filled 2023-07-05: qty 2

## 2023-07-05 MED ORDER — PROPOFOL 500 MG/50ML IV EMUL
INTRAVENOUS | Status: DC | PRN
  Administered 2023-07-05: 100 ug/kg/min via INTRAVENOUS

## 2023-07-05 MED ORDER — PROPOFOL 10 MG/ML IV BOLUS
INTRAVENOUS | Status: DC | PRN
  Administered 2023-07-05: 70 mg via INTRAVENOUS

## 2023-07-05 MED ORDER — PROPOFOL 1000 MG/100ML IV EMUL
INTRAVENOUS | Status: AC
  Filled 2023-07-05: qty 100

## 2023-07-05 MED ORDER — MIDAZOLAM HCL 2 MG/2ML IJ SOLN
INTRAMUSCULAR | Status: DC | PRN
  Administered 2023-07-05: 2 mg via INTRAVENOUS

## 2023-07-05 NOTE — Anesthesia Preprocedure Evaluation (Signed)
Anesthesia Evaluation  Patient identified by MRN, date of birth, ID band Patient awake    Reviewed: Allergy & Precautions, NPO status , Patient's Chart, lab work & pertinent test results  History of Anesthesia Complications Negative for: history of anesthetic complications  Airway Mallampati: III  TM Distance: >3 FB Neck ROM: Full    Dental  (+) Lower Dentures, Upper Dentures   Pulmonary shortness of breath and with exertion, asthma , neg sleep apnea, COPD,  COPD inhaler, Current SmokerPatient did not abstain from smoking.   Pulmonary exam normal breath sounds clear to auscultation       Cardiovascular Exercise Tolerance: Good METShypertension, Pt. on medications (-) CAD and (-) Past MI (-) dysrhythmias  Rhythm:Regular Rate:Normal - Systolic murmurs    Neuro/Psych  Headaches PSYCHIATRIC DISORDERS Anxiety Depression     Neuromuscular disease    GI/Hepatic ,GERD  Medicated and Controlled,,(+)     (-) substance abuse    Endo/Other  diabetes    Renal/GU negative Renal ROS     Musculoskeletal  (+) Arthritis ,  Fibromyalgia -  Abdominal   Peds  Hematology   Anesthesia Other Findings Past Medical History: No date: Anemia No date: Anxiety No date: Arthritis No date: Cancer (HCC)     Comment:  skin cancer, removed No date: COPD (chronic obstructive pulmonary disease) (HCC) No date: Depression No date: Diabetes mellitus without complication (HCC) No date: Dyspnea No date: Dysrhythmia     Comment:  occasional pvc's. not being treated. No date: Fatigue No date: GERD (gastroesophageal reflux disease) No date: Headache No date: Heart murmur No date: Hypertension No date: Hypothyroidism No date: Pneumonia No date: Thyroid disease No date: Tremors of nervous system  Reproductive/Obstetrics                             Anesthesia Physical Anesthesia Plan  ASA: 3  Anesthesia Plan: General    Post-op Pain Management: Minimal or no pain anticipated   Induction: Intravenous  PONV Risk Score and Plan: 1 and Propofol infusion, TIVA and Ondansetron  Airway Management Planned: Nasal Cannula  Additional Equipment: None  Intra-op Plan:   Post-operative Plan:   Informed Consent: I have reviewed the patients History and Physical, chart, labs and discussed the procedure including the risks, benefits and alternatives for the proposed anesthesia with the patient or authorized representative who has indicated his/her understanding and acceptance.     Dental advisory given  Plan Discussed with: CRNA and Surgeon  Anesthesia Plan Comments: (Discussed risks of anesthesia with patient, including possibility of difficulty with spontaneous ventilation under anesthesia necessitating airway intervention, PONV, and rare risks such as cardiac or respiratory or neurological events, and allergic reactions. Discussed the role of CRNA in patient's perioperative care. Patient understands. Patient counseled on benefits of smoking cessation, and increased perioperative risks associated with continued smoking. )        Anesthesia Quick Evaluation

## 2023-07-05 NOTE — Transfer of Care (Signed)
  Immediate Anesthesia Transfer of Care Note  Patient: Linda Bradford  Procedure(s) Performed: COLONOSCOPY WITH PROPOFOL POLYPECTOMY  Patient Location: PACU  Anesthesia Type:General  Level of Consciousness: awake, alert , and oriented  Airway & Oxygen Therapy: Patient Spontanous Breathing and Patient connected to nasal cannula oxygen  Post-op Assessment: Report given to RN, Post -op Vital signs reviewed and stable, and Patient moving all extremities  Post vital signs: Reviewed and stable  Last Vitals:  Vitals Value Taken Time  BP 97/82 07/05/23 0915  Temp 36.1 C 07/05/23 0915  Pulse 78 07/05/23 0916  Resp 18 07/05/23 0916  SpO2 98 % 07/05/23 0916  Vitals shown include unfiled device data.  Last Pain:  Vitals:   07/05/23 0915  TempSrc: Temporal  PainSc: 0-No pain         Complications: No notable events documented.

## 2023-07-05 NOTE — H&P (Signed)
Arlyss Repress, MD [address]  [address]  [place], [place] [address]  Main: [phone]  Fax: [phone] Pager: [phone]  Primary Care Physician:  Jacky Kindle, FNP Primary Gastroenterologist:  Dr. Arlyss Repress  Pre-Procedure History & Physical: HPI:  Linda Bradford is a 67 y.o. female is here for an colonoscopy.   Past Medical History:  Diagnosis Date   Anemia    Anxiety    Arthritis    Cancer (HCC)    skin cancer, removed   COPD (chronic obstructive pulmonary disease) (HCC)    Depression    Diabetes mellitus without complication (HCC)    Disordered sleep 01/13/2014   Dyspnea    Dysrhythmia    occasional pvc's. not being treated.   Fatigue    GERD (gastroesophageal reflux disease)    Headache    Heart murmur    Hypertension    Hypothyroidism    Pneumonia    Thyroid disease    Tremors of nervous system     Past Surgical History:  Procedure Laterality Date   APPENDECTOMY     CHOLECYSTECTOMY N/A 07/01/2019   Procedure: LAPAROSCOPIC CHOLECYSTECTOMY;  Surgeon: Henrene Dodge, MD;  Location: [facility];  Service: General;  Laterality: N/A;   COLECTOMY WITH COLOSTOMY CREATION/HARTMANN PROCEDURE N/A 09/03/2018   Error, this was not done.   COLONOSCOPY WITH PROPOFOL N/A 03/08/2019   Procedure: COLONOSCOPY WITH PROPOFOL;  Surgeon: Pasty Spillers, MD;  Location: [facility];  Service: Endoscopy;  Laterality: N/A;   CYSTOSCOPY WITH STENT PLACEMENT Bilateral 09/03/2018   Procedure: CYSTOSCOPY WITH STENT PLACEMENT-LIGHTED STENTS;  Surgeon: Vanna Scotland, MD;  Location: [facility];  Service: Urology;  Laterality: Bilateral;   LAPAROSCOPIC SIGMOID COLECTOMY N/A 09/03/2018   Procedure: LAPAROSCOPIC SIGMOID COLECTOMY;  Surgeon: Henrene Dodge, MD;  Location: [facility];  Service: General;  Laterality: N/A;   percutaneous drainage tube  07/2018   2nd tube placed. not healing in colon, causing a fistula   TONSILLECTOMY     TOTAL KNEE ARTHROPLASTY Left 05/10/2021    Procedure: TOTAL KNEE ARTHROPLASTY;  Surgeon: Lyndle Herrlich, MD;  Location: [facility];  Service: Orthopedics;  Laterality: Left;   TOTAL KNEE ARTHROPLASTY Right 01/10/2022   Procedure: TOTAL KNEE ARTHROPLASTY;  Surgeon: Lyndle Herrlich, MD;  Location: [facility];  Service: Orthopedics;  Laterality: Right;   TUBAL LIGATION      Prior to Admission medications   Medication Sig Start Date End Date Taking? Authorizing Provider  acetaminophen (TYLENOL) 500 MG tablet Take 1,000 mg by mouth every 6 (six) hours as needed for mild pain or moderate pain.     [provider]  albuterol (VENTOLIN HFA) 108 (90 Base) MCG/ACT inhaler Inhale into the lungs. 02/22/23   [provider]  amLODipine (NORVASC) 5 MG tablet TAKE 1 TABLET(5 MG) BY MOUTH DAILY 07/04/23   Jacky Kindle, FNP  aspirin 81 MG chewable tablet Chew 1 tablet (81 mg total) by mouth 2 (two) times daily. 01/13/22   Altamese Cabal, PA-C  blood glucose meter kit and supplies Dispense based on patient and insurance preference. Once daily. (FOR ICD-10 E10.9, E11.9). 07/12/22   Jacky Kindle, FNP  buPROPion (WELLBUTRIN XL) 300 MG 24 hr tablet Take 1 tablet (300 mg total) by mouth daily. 08/17/22   Jacky Kindle, FNP  Cholecalciferol (VITAMIN D) 50 MCG (2000 UT) tablet Take 2,000 Units by mouth daily.    [provider]  diclofenac Sodium (VOLTAREN) 1 % GEL  [provider]  DULoxetine (CYMBALTA) 60 MG capsule Take 1 capsule (60 mg total) by mouth 2 (two) times daily. 07/03/23   Sallee Provencal, FNP  ezetimibe (ZETIA) 10 MG tablet Take 1 tablet (10 mg total) by mouth daily. 06/13/23 09/11/23  Antonieta Iba, MD  fluticasone (FLONASE) 50 MCG/ACT nasal spray USE 2 SPRAYS IN BOTH NOSTRILS DAILY. 06/14/23   Jacky Kindle, FNP  Fluticasone-Umeclidin-Vilant (TRELEGY ELLIPTA) 100-62.5-25 MCG/ACT AEPB Inhale 1 puff into the lungs daily. 08/19/22   Jacky Kindle, FNP  gabapentin (NEURONTIN) 300 MG capsule Take 1 capsule (300  mg total) by mouth 2 (two) times daily. Take 2 capsules PO every morning; Take 3 capsules PO every evening. 11/03/22   Jacky Kindle, FNP  glucose blood (CONTOUR NEXT TEST) test strip To check blood sugar once daily 06/09/17   Margaretann Loveless, PA-C  ipratropium-albuterol (DUONEB) 0.5-2.5 (3) MG/3ML SOLN Take 3 mLs by nebulization every 6 (six) hours as needed. 08/29/22   Jacky Kindle, FNP  levothyroxine (SYNTHROID) 75 MCG tablet Take 1 tablet (75 mcg total) by mouth daily before breakfast. 08/17/22   Jacky Kindle, FNP  loperamide (IMODIUM A-D) 2 MG tablet Take 2-4 mg by mouth 4 (four) times daily as needed for diarrhea or loose stools.    [provider]  meloxicam (MOBIC) 15 MG tablet Take 1 tablet (15 mg total) by mouth daily. 09/15/20   Margaretann Loveless, PA-C  metoCLOPramide (REGLAN) 10 MG tablet TAKE 1 TABLET(10 MG) BY MOUTH EVERY 8 HOURS AS NEEDED FOR NAUSEA 01/31/23   Jacky Kindle, FNP  metoprolol succinate (TOPROL-XL) 50 MG 24 hr tablet Take 1 tablet (50 mg total) by mouth daily. 01/18/23   Jacky Kindle, FNP  montelukast (SINGULAIR) 10 MG tablet Take 1 tablet (10 mg total) by mouth at bedtime. 08/29/22   Jacky Kindle, FNP  nystatin (MYCOSTATIN) 100000 UNIT/ML suspension Take 5 mLs (500,000 Units total) by mouth 4 (four) times daily. 10/20/22   Jacky Kindle, FNP  nystatin (MYCOSTATIN/NYSTOP) powder Apply 1 application topically 3 (three) times daily. 10/01/21   Jacky Kindle, FNP  oxybutynin (DITROPAN-XL) 5 MG 24 hr tablet Take 1 tablet (5 mg total) by mouth at bedtime. 08/18/22   Allie Bossier, MD  pantoprazole (PROTONIX) 20 MG tablet TAKE 1 TABLET(20 MG) BY MOUTH TWICE DAILY BEFORE A MEAL 06/20/23   Jacky Kindle, FNP  primidone (MYSOLINE) 50 MG tablet Take 0.5 tablets (25 mg total) by mouth 2 (two) times daily. 07/01/22   Marrion Coy, MD  rosuvastatin (CRESTOR) 10 MG tablet TAKE 1 TABLET (10 MG TOTAL) BY MOUTH DAILY. 06/07/23   Jacky Kindle, FNP  traMADol (ULTRAM) 50 MG  tablet Take 50 mg by mouth 2 (two) times daily as needed for moderate pain. 09/29/22   [provider]    Allergies as of 06/09/2023 - Review Complete 06/08/2023  Allergen Reaction Noted   Metformin and related Diarrhea 05/03/2023   Dexilant [dexlansoprazole] Nausea Only and Other (See Comments) 07/28/2016    Family History  Problem Relation Age of Onset   Lung cancer Mother    Alcohol abuse Sister    Arthritis Sister    Alcohol abuse Brother    Heart disease Brother 64   Heart attack Brother    Pancreatic cancer Maternal Aunt    Diabetes Maternal Aunt    Alcohol abuse Son    Drug abuse Son    Drug abuse Son  Heart disease Father 36   Diabetes Sister    Breast cancer Brother    Alcohol abuse Maternal Uncle    Alcohol abuse Maternal Grandmother    Diabetes Maternal Grandmother     Social History   Socioeconomic History   Marital status: Widowed    Spouse name: Rosanne Ashing   Number of children: 2   Years of education: Not on file   Highest education level: Not on file  Occupational History   Occupation: Energy manager    Comment: currently employed  Tobacco Use   Smoking status: Every Day    Current packs/day: 2.00    Average packs/day: 2.0 packs/day for 45.1 years (90.3 ttl pk-yrs)    Types: Cigarettes    Start date: 03/02/1980   Smokeless tobacco: Never   Tobacco comments:    1.5PPD 05/16/2022  Vaping Use   Vaping status: Former   Devices: only for a month. did not like  Substance and Sexual Activity   Alcohol use: No    Alcohol/week: 0.0 standard drinks of alcohol   Drug use: No   Sexual activity: Not Currently  Other Topics Concern   Not on file  Social History Narrative   Live with hubby at home, sister susie with down syndrome   Social Determinants of Health   Financial Resource Strain: Low Risk  (05/01/2023)   Overall Financial Resource Strain (CARDIA)    Difficulty of Paying Living Expenses: Not hard at all  Food Insecurity: No Food Insecurity  (05/01/2023)   Hunger Vital Sign    Worried About Running Out of Food in the Last Year: Never true    Ran Out of Food in the Last Year: Never true  Transportation Needs: No Transportation Needs (05/01/2023)   PRAPARE - Administrator, Civil Service (Medical): No    Lack of Transportation (Non-Medical): No  Physical Activity: Inactive (05/01/2023)   Exercise Vital Sign    Days of Exercise per Week: 0 days    Minutes of Exercise per Session: 0 min  Stress: Stress Concern Present (05/01/2023)   [employer] of Occupational Health - Occupational Stress Questionnaire    Feeling of Stress : To some extent  Social Connections: Moderately Integrated (05/01/2023)   Social Connection and Isolation Panel [NHANES]    Frequency of Communication with Friends and Family: Once a week    Frequency of Social Gatherings with Friends and Family: Never    Attends Religious Services: More than 4 times per year    Active Member of [employer] or Organizations: Yes    Attends Banker Meetings: 1 to 4 times per year    Marital Status: Married  Catering manager Violence: Not At Risk (05/01/2023)   Humiliation, Afraid, Rape, and Kick questionnaire    Fear of Current or Ex-Partner: No    Emotionally Abused: No    Physically Abused: No    Sexually Abused: No    Review of Systems: See HPI, otherwise negative ROS  Physical Exam: BP 97/82   Pulse 84   Temp (!) 97 F (36.1 C) (Temporal)   Resp 18   Ht 5\' 11"  (1.803 m)   Wt 99.8 kg   SpO2 95%   BMI 30.68 kg/m  General:   Alert,  pleasant and cooperative in NAD Head:  Normocephalic and atraumatic. Neck:  Supple; no masses or thyromegaly. Lungs:  Clear throughout to auscultation.    Heart:  Regular rate and rhythm. Abdomen:  Soft, nontender and nondistended. Normal  bowel sounds, without guarding, and without rebound.   Neurologic:  Alert and  oriented x4;  grossly normal neurologically.  Impression/Plan: Shelsy Wakley is here for an  colonoscopy to be performed for h/o colon adenomas  Risks, benefits, limitations, and alternatives regarding  colonoscopy have been reviewed with the patient.  Questions have been answered.  All parties agreeable.   Lannette Donath, MD  07/05/2023, 9:18 AM

## 2023-07-05 NOTE — Anesthesia Postprocedure Evaluation (Signed)
Anesthesia Post Note  Patient: Linda Bradford  Procedure(s) Performed: COLONOSCOPY WITH PROPOFOL POLYPECTOMY  Patient location during evaluation: Endoscopy Anesthesia Type: General Level of consciousness: awake and alert Pain management: pain level controlled Vital Signs Assessment: post-procedure vital signs reviewed and stable Respiratory status: spontaneous breathing, nonlabored ventilation, respiratory function stable and patient connected to nasal cannula oxygen Cardiovascular status: blood pressure returned to baseline and stable Postop Assessment: no apparent nausea or vomiting Anesthetic complications: no   No notable events documented.   Last Vitals:  Vitals:   07/05/23 0925 07/05/23 0935  BP: 132/82   Pulse: 74 69  Resp:    Temp:    SpO2: 100% 100%    Last Pain:  Vitals:   07/05/23 0935  TempSrc:   PainSc: 0-No pain                 Corinda Gubler

## 2023-07-05 NOTE — Op Note (Signed)
[facility] Patient Name: Linda Bradford Procedure Date: 07/05/2023 8:15 AM MRN: [id number] Account #: [id number] Date of Birth: Mar 26, 1956 Admit Type: Outpatient Age: 67 Room: [facility] ENDO ROOM 2 Gender: Female Note Status: Finalized Instrument Name: Prentice Docker [id number] Procedure:             Colonoscopy Indications:           Surveillance: Personal history of adenomatous polyps                         on last colonoscopy 3 years ago, Last colonoscopy:                         August 2020 Providers:             Toney Reil MD, MD Referring MD:          Daryl Eastern. Suzie Portela (Referring MD) Medicines:             General Anesthesia Complications:         No immediate complications. Estimated blood loss: None. Procedure:             Pre-Anesthesia Assessment:                        - Prior to the procedure, a History and Physical was                         performed, and patient medications and allergies were                         reviewed. The patient is competent. The risks and                         benefits of the procedure and the sedation options and                         risks were discussed with the patient. All questions                         were answered and informed consent was obtained.                         Patient identification and proposed procedure were                         verified by the physician, the nurse, the                         anesthesiologist, the anesthetist and the technician                         in the pre-procedure area in the procedure room in the                         endoscopy suite. Mental Status Examination: normal.                         Airway Examination: normal oropharyngeal airway and  neck mobility. Respiratory Examination: clear to                         auscultation. CV Examination: normal. Prophylactic                         Antibiotics: The patient does not require  prophylactic                         antibiotics. Prior Anticoagulants: The patient has                         taken no anticoagulant or antiplatelet agents. ASA                         Grade Assessment: III - A patient with severe systemic                         disease. After reviewing the risks and benefits, the                         patient was deemed in satisfactory condition to                         undergo the procedure. The anesthesia plan was to use                         general anesthesia. Immediately prior to                         administration of medications, the patient was                         re-assessed for adequacy to receive sedatives. The                         heart rate, respiratory rate, oxygen saturations,                         blood pressure, adequacy of pulmonary ventilation, and                         response to care were monitored throughout the                         procedure. The physical status of the patient was                         re-assessed after the procedure.                        After obtaining informed consent, the colonoscope was                         passed under direct vision. Throughout the procedure,                         the patient's blood pressure, pulse, and oxygen  saturations were monitored continuously. The                         Colonoscope was introduced through the anus and                         advanced to the the cecum, identified by appendiceal                         orifice and ileocecal valve. The colonoscopy was                         performed without difficulty. The patient tolerated                         the procedure well. The quality of the bowel                         preparation was evaluated using the BBPS [facility] Bowel                         Preparation Scale) with scores of: Right Colon = 2                         (minor amount of residual staining, small  fragments of                         stool and/or opaque liquid, but mucosa seen well),                         Transverse Colon = 2 (minor amount of residual                         staining, small fragments of stool and/or opaque                         liquid, but mucosa seen well) and Left Colon = 2                         (minor amount of residual staining, small fragments of                         stool and/or opaque liquid, but mucosa seen well). The                         total BBPS score equals 6. The ileocecal valve,                         appendiceal orifice, and rectum were photographed. Findings:      The perianal and digital rectal examinations were normal. Pertinent       negatives include normal sphincter tone and no palpable rectal lesions.      Four sessile polyps were found in the descending colon, transverse colon       and cecum. The polyps were 7 to 8 mm in size. These polyps were removed       with a cold snare. Resection and retrieval were complete. Estimated  blood loss was minimal.      A few small-mouthed diverticula were found in the descending colon.      There was evidence of a prior functional end-to-end colo-colonic       anastomosis in the sigmoid colon. This was patent and was characterized       by healthy appearing mucosa. The anastomosis was traversed.      The retroflexed view of the distal rectum and anal verge was normal and       showed no anal or rectal abnormalities. Impression:            - Four 7 to 8 mm polyps in the descending colon, in                         the transverse colon and in the cecum, removed with a                         cold snare. Resected and retrieved.                        - Diverticulosis in the descending colon.                        - Patent functional end-to-end colo-colonic                         anastomosis, characterized by healthy appearing mucosa.                        - The distal rectum and anal  verge are normal on                         retroflexion view. Recommendation:        - Discharge patient to home (with escort).                        - Resume previous diet today.                        - Continue present medications.                        - Await pathology results.                        - Repeat colonoscopy in 3 years for surveillance of                         multiple polyps. Procedure Code(s):     --- Professional ---                        [phone], Colonoscopy, flexible; with removal of                         tumor(s), polyp(s), or other lesion(s) by snare                         technique Diagnosis Code(s):     --- Professional ---  Z86.010, Personal history of colonic polyps                        D12.4, Benign neoplasm of descending colon                        D12.3, Benign neoplasm of transverse colon (hepatic                         flexure or splenic flexure)                        D12.0, Benign neoplasm of cecum                        Z98.0, Intestinal bypass and anastomosis status                        K57.30, Diverticulosis of large intestine without                         perforation or abscess without bleeding CPT copyright 2022 American Medical Association. All rights reserved. The codes documented in this report are preliminary and upon coder review may  be revised to meet current compliance requirements. Dr. Libby Maw Toney Reil MD, MD 07/05/2023 9:12:59 AM This report has been signed electronically. Number of Addenda: 0 Note Initiated On: 07/05/2023 8:15 AM Scope Withdrawal Time: 0 hours 20 minutes 29 seconds  Total Procedure Duration: 0 hours 23 minutes 18 seconds  Estimated Blood Loss:  Estimated blood loss: none.      [facility]

## 2023-07-06 ENCOUNTER — Other Ambulatory Visit: Payer: Self-pay | Admitting: Cardiovascular Disease

## 2023-07-06 ENCOUNTER — Encounter: Payer: Self-pay | Admitting: Gastroenterology

## 2023-07-06 DIAGNOSIS — J432 Centrilobular emphysema: Secondary | ICD-10-CM

## 2023-07-06 DIAGNOSIS — R0989 Other specified symptoms and signs involving the circulatory and respiratory systems: Secondary | ICD-10-CM

## 2023-07-06 DIAGNOSIS — I700 Atherosclerosis of aorta: Secondary | ICD-10-CM

## 2023-07-06 DIAGNOSIS — I359 Nonrheumatic aortic valve disorder, unspecified: Secondary | ICD-10-CM

## 2023-07-06 DIAGNOSIS — Z87891 Personal history of nicotine dependence: Secondary | ICD-10-CM

## 2023-07-06 DIAGNOSIS — Z79899 Other long term (current) drug therapy: Secondary | ICD-10-CM

## 2023-07-06 DIAGNOSIS — E782 Mixed hyperlipidemia: Secondary | ICD-10-CM

## 2023-07-06 DIAGNOSIS — I2510 Atherosclerotic heart disease of native coronary artery without angina pectoris: Secondary | ICD-10-CM

## 2023-07-06 LAB — SURGICAL PATHOLOGY

## 2023-07-07 ENCOUNTER — Ambulatory Visit: Payer: Medicare PPO | Attending: Cardiovascular Disease

## 2023-07-07 ENCOUNTER — Encounter: Payer: Self-pay | Admitting: Gastroenterology

## 2023-07-07 DIAGNOSIS — R0989 Other specified symptoms and signs involving the circulatory and respiratory systems: Secondary | ICD-10-CM | POA: Diagnosis not present

## 2023-07-11 ENCOUNTER — Encounter: Payer: Self-pay | Admitting: Emergency Medicine

## 2023-07-18 ENCOUNTER — Other Ambulatory Visit: Payer: Self-pay

## 2023-07-18 DIAGNOSIS — Z87891 Personal history of nicotine dependence: Secondary | ICD-10-CM

## 2023-07-18 DIAGNOSIS — F17200 Nicotine dependence, unspecified, uncomplicated: Secondary | ICD-10-CM

## 2023-07-18 DIAGNOSIS — Z122 Encounter for screening for malignant neoplasm of respiratory organs: Secondary | ICD-10-CM

## 2023-08-03 ENCOUNTER — Encounter: Payer: Self-pay | Admitting: Family Medicine

## 2023-08-03 ENCOUNTER — Ambulatory Visit: Payer: Medicare PPO | Admitting: Family Medicine

## 2023-08-03 VITALS — BP 114/67 | HR 70 | Ht 70.0 in | Wt 227.6 lb

## 2023-08-03 DIAGNOSIS — I700 Atherosclerosis of aorta: Secondary | ICD-10-CM

## 2023-08-03 DIAGNOSIS — E785 Hyperlipidemia, unspecified: Secondary | ICD-10-CM | POA: Diagnosis not present

## 2023-08-03 DIAGNOSIS — E1169 Type 2 diabetes mellitus with other specified complication: Secondary | ICD-10-CM

## 2023-08-03 DIAGNOSIS — E559 Vitamin D deficiency, unspecified: Secondary | ICD-10-CM | POA: Diagnosis not present

## 2023-08-03 DIAGNOSIS — J418 Mixed simple and mucopurulent chronic bronchitis: Secondary | ICD-10-CM

## 2023-08-03 DIAGNOSIS — M2550 Pain in unspecified joint: Secondary | ICD-10-CM | POA: Diagnosis not present

## 2023-08-03 DIAGNOSIS — F331 Major depressive disorder, recurrent, moderate: Secondary | ICD-10-CM

## 2023-08-03 DIAGNOSIS — J432 Centrilobular emphysema: Secondary | ICD-10-CM

## 2023-08-03 DIAGNOSIS — F39 Unspecified mood [affective] disorder: Secondary | ICD-10-CM

## 2023-08-03 DIAGNOSIS — E1159 Type 2 diabetes mellitus with other circulatory complications: Secondary | ICD-10-CM

## 2023-08-03 DIAGNOSIS — I152 Hypertension secondary to endocrine disorders: Secondary | ICD-10-CM | POA: Diagnosis not present

## 2023-08-03 DIAGNOSIS — E039 Hypothyroidism, unspecified: Secondary | ICD-10-CM

## 2023-08-03 DIAGNOSIS — E1149 Type 2 diabetes mellitus with other diabetic neurological complication: Secondary | ICD-10-CM

## 2023-08-03 DIAGNOSIS — Z1231 Encounter for screening mammogram for malignant neoplasm of breast: Secondary | ICD-10-CM

## 2023-08-03 MED ORDER — MONTELUKAST SODIUM 10 MG PO TABS: 10.0000 mg | ORAL_TABLET | Freq: Every day | ORAL | 1 refills | Status: DC

## 2023-08-03 MED ORDER — MELOXICAM 15 MG PO TABS: 15.0000 mg | ORAL_TABLET | Freq: Every day | ORAL | 0 refills | Status: DC

## 2023-08-03 NOTE — Assessment & Plan Note (Signed)
 Chronic, improved Upcoming 1 year anniversary from her husband passing Pt reports self d/c of wellbutrin  300xr    08/03/2023    2:00 PM 07/03/2023    3:52 PM 05/03/2023    2:29 PM  PHQ9 SCORE ONLY  PHQ-9 Total Score 5 0 4      08/03/2023    2:00 PM 07/03/2023    3:52 PM 05/03/2023    2:35 PM 11/03/2022    1:33 PM  GAD 7 : Generalized Anxiety Score  Nervous, Anxious, on Edge 0 0 0 1  Control/stop worrying 0 0 0 1  Worry too much - different things 0 0 1 1  Trouble relaxing 0 0 0 1  Restless 0 0 0 1  Easily annoyed or irritable 0 0 0 1  Afraid - awful might happen 0 0 0 1  Total GAD 7 Score 0 0 1 7  Anxiety Difficulty Not difficult at all Not difficult at all  Not difficult at all

## 2023-08-03 NOTE — Assessment & Plan Note (Signed)
 Chronic, stable Plans to restart singulair 10 to assist with symptoms F/u as needed

## 2023-08-03 NOTE — Progress Notes (Signed)
 Established patient visit  Patient: Linda Bradford   DOB: 22-Mar-1956   68 y.o. Female  MRN: [id number] Visit Date: 08/03/2023  Today's healthcare provider: Kelly ONEIDA Cedar, FNP  Introduced to nurse practitioner role and practice setting.  All questions answered.  Discussed provider/patient relationship and expectations.  Chief Complaint  Patient presents with   Follow-up    F/u chronic management.   Subjective    HPI HPI     Follow-up    Additional comments: F/u chronic management.      Last edited by Deitra Therisa HERO, CMA on 08/03/2023  1:54 PM.      Plans to see podiatry since 12/2 appt with great tow pain  Completed colon cancer screening 12/4  Saw cardiology- Gollan: plaque okay  Medications: Outpatient Medications Prior to Visit  Medication Sig   acetaminophen  (TYLENOL ) 500 MG tablet Take 1,000 mg by mouth every 6 (six) hours as needed for mild pain or moderate pain.    albuterol  (VENTOLIN  HFA) 108 (90 Base) MCG/ACT inhaler Inhale into the lungs.   amLODipine  (NORVASC ) 5 MG tablet TAKE 1 TABLET(5 MG) BY MOUTH DAILY   aspirin  81 MG chewable tablet Chew 1 tablet (81 mg total) by mouth 2 (two) times daily.   blood glucose meter kit and supplies Dispense based on patient and insurance preference. Once daily. (FOR ICD-10 E10.9, E11.9).   buPROPion  (WELLBUTRIN  XL) 300 MG 24 hr tablet Take 1 tablet (300 mg total) by mouth daily.   Cholecalciferol  (VITAMIN D ) 50 MCG (2000 UT) tablet Take 2,000 Units by mouth daily.   diclofenac  Sodium (VOLTAREN ) 1 % GEL    DULoxetine  (CYMBALTA ) 60 MG capsule Take 1 capsule (60 mg total) by mouth 2 (two) times daily.   ezetimibe  (ZETIA ) 10 MG tablet Take 1 tablet (10 mg total) by mouth daily.   fluticasone  (FLONASE ) 50 MCG/ACT nasal spray USE 2 SPRAYS IN BOTH NOSTRILS DAILY.   Fluticasone -Umeclidin-Vilant (TRELEGY ELLIPTA ) 100-62.5-25 MCG/ACT AEPB Inhale 1 puff into the lungs daily.   gabapentin  (NEURONTIN ) 300 MG capsule Take 1 capsule (300  mg total) by mouth 2 (two) times daily. Take 2 capsules PO every morning; Take 3 capsules PO every evening.   glucose blood (CONTOUR NEXT TEST) test strip To check blood sugar once daily   ipratropium-albuterol  (DUONEB) 0.5-2.5 (3) MG/3ML SOLN Take 3 mLs by nebulization every 6 (six) hours as needed.   levothyroxine  (SYNTHROID ) 75 MCG tablet Take 1 tablet (75 mcg total) by mouth daily before breakfast.   loperamide  (IMODIUM  A-D) 2 MG tablet Take 2-4 mg by mouth 4 (four) times daily as needed for diarrhea or loose stools.   metoCLOPramide  (REGLAN ) 10 MG tablet TAKE 1 TABLET(10 MG) BY MOUTH EVERY 8 HOURS AS NEEDED FOR NAUSEA   metoprolol  succinate (TOPROL -XL) 50 MG 24 hr tablet Take 1 tablet (50 mg total) by mouth daily.   nystatin  (MYCOSTATIN ) 100000 UNIT/ML suspension Take 5 mLs (500,000 Units total) by mouth 4 (four) times daily.   nystatin  (MYCOSTATIN /NYSTOP ) powder Apply 1 application topically 3 (three) times daily.   oxybutynin  (DITROPAN -XL) 5 MG 24 hr tablet Take 1 tablet (5 mg total) by mouth at bedtime.   pantoprazole  (PROTONIX ) 20 MG tablet TAKE 1 TABLET(20 MG) BY MOUTH TWICE DAILY BEFORE A MEAL   primidone  (MYSOLINE ) 50 MG tablet Take 0.5 tablets (25 mg total) by mouth 2 (two) times daily.   rosuvastatin  (CRESTOR ) 10 MG tablet TAKE 1 TABLET (10 MG TOTAL) BY MOUTH DAILY.  traMADol  (ULTRAM ) 50 MG tablet Take 50 mg by mouth 2 (two) times daily as needed for moderate pain.   [DISCONTINUED] meloxicam  (MOBIC ) 15 MG tablet Take 1 tablet (15 mg total) by mouth daily.   [DISCONTINUED] montelukast  (SINGULAIR ) 10 MG tablet Take 1 tablet (10 mg total) by mouth at bedtime.   No facility-administered medications prior to visit.   Last CBC Lab Results  Component Value Date   WBC 5.7 01/18/2023   HGB 14.0 01/18/2023   HCT 40.8 01/18/2023   MCV 90 01/18/2023   MCH 30.7 01/18/2023   RDW 13.0 01/18/2023   PLT 288 01/18/2023   Last metabolic panel Lab Results  Component Value Date   GLUCOSE  119 (H) 01/18/2023   NA 140 01/18/2023   K 4.3 01/18/2023   CL 101 01/18/2023   CO2 23 01/18/2023   BUN 11 01/18/2023   CREATININE 0.68 01/18/2023   EGFR 95 01/18/2023   CALCIUM  9.6 01/18/2023   PHOS 3.4 06/30/2022   PROT 6.8 01/18/2023   ALBUMIN 4.4 01/18/2023   LABGLOB 2.4 01/18/2023   AGRATIO 1.8 09/02/2021   BILITOT 0.3 01/18/2023   ALKPHOS 118 01/18/2023   AST 17 01/18/2023   ALT 18 01/18/2023   ANIONGAP 8 11/01/2022   Last lipids Lab Results  Component Value Date   CHOL 169 12/07/2021   HDL 60 12/07/2021   LDLCALC 83 12/07/2021   TRIG 149 12/07/2021   CHOLHDL 2.8 12/07/2021   Last hemoglobin A1c Lab Results  Component Value Date   HGBA1C 7.1 (A) 05/03/2023   Last thyroid  functions Lab Results  Component Value Date   TSH 1.120 01/18/2023   T4TOTAL 10.3 04/06/2021   Last vitamin D  Lab Results  Component Value Date   VD25OH 38.9 01/18/2023   Last vitamin B12 and Folate Lab Results  Component Value Date   VITAMINB12 364 01/18/2023   FOLATE 10.4 01/18/2023     Objective    BP 114/67 (BP Location: Right Arm, Patient Position: Sitting, Cuff Size: Large)   Pulse 70   Ht 5' 10 (1.778 m)   Wt 227 lb 9.6 oz (103.2 kg)   SpO2 95%   BMI 32.66 kg/m   BP Readings from Last 3 Encounters:  08/03/23 114/67  07/05/23 132/82  07/03/23 (!) 136/56   Wt Readings from Last 3 Encounters:  08/03/23 227 lb 9.6 oz (103.2 kg)  07/05/23 220 lb (99.8 kg)  07/03/23 225 lb (102.1 kg)   SpO2 Readings from Last 3 Encounters:  08/03/23 95%  07/05/23 100%  07/03/23 94%   Physical Exam Vitals and nursing note reviewed.  Constitutional:      General: She is not in acute distress.    Appearance: Normal appearance. She is obese. She is not ill-appearing, toxic-appearing or diaphoretic.  HENT:     Head: Normocephalic and atraumatic.  Cardiovascular:     Rate and Rhythm: Normal rate and regular rhythm.     Pulses: Normal pulses.     Heart sounds: Normal heart sounds.  No murmur heard.    No friction rub. No gallop.  Pulmonary:     Effort: Pulmonary effort is normal. No respiratory distress.     Breath sounds: Normal breath sounds. No stridor. No wheezing, rhonchi or rales.  Chest:     Chest wall: No tenderness.  Musculoskeletal:        General: No swelling, tenderness, deformity or signs of injury. Normal range of motion.     Right lower leg: No  edema.     Left lower leg: No edema.  Skin:    General: Skin is warm and dry.     Capillary Refill: Capillary refill takes less than 2 seconds.     Coloration: Skin is not jaundiced or pale.     Findings: No bruising, erythema, lesion or rash.  Neurological:     General: No focal deficit present.     Mental Status: She is alert and oriented to person, place, and time. Mental status is at baseline.     Cranial Nerves: No cranial nerve deficit.     Sensory: No sensory deficit.     Motor: No weakness.     Coordination: Coordination normal.  Psychiatric:        Mood and Affect: Mood normal.        Behavior: Behavior normal.        Thought Content: Thought content normal.        Judgment: Judgment normal.     No results found for any visits on 08/03/23.  Assessment & Plan     Problem List Items Addressed This Visit       Cardiovascular and Mediastinum   Aortic atherosclerosis (HCC)   Chronic, noted on imaging On crestor  10 mg Continue to recommend tobacco cessation efforts; pt declines assistance at this time Brief discussion regarding risks of tobacco/nicotine  use and recommendations on ways to reduce use and work towards cessation of use of tobacco/nicotine  products. Encouraged to use 1-800-QUIT-NOW.       Relevant Orders   CBC with Differential/Platelet   Comprehensive Metabolic Panel (CMET)   Lipid panel   Hypertension associated with type 2 diabetes mellitus (HCC) - Primary   Chronic, at goal Continue medications as previously prescribed -norvasc  5 mg  -toprol  50 mg      Relevant  Orders   Hemoglobin A1c   Urine Microalbumin w/creat. ratio   TSH     Respiratory   Centrilobular emphysema (HCC)   Chronic, stable Plans to restart singulair  10 to assist with symptoms F/u as needed       Relevant Medications   montelukast  (SINGULAIR ) 10 MG tablet     Endocrine   Adult hypothyroidism   Chronic, previously stable Repeat TSH Last on synthroid  75 mcg       Diabetes mellitus type 2 with neurological manifestations (HCC)   Chronic, with neuropathy to great toe Repeat A1c and urine micro albumin Upcoming podiatry appt per pt report Continue to recommend balanced, lower carb meals. Smaller meal size, adding snacks. Choosing water  as drink of choice and increasing purposeful exercise.       Hyperlipidemia associated with type 2 diabetes mellitus (HCC)   Chronic, elevated LDL goal remains <50 Repeat LP Last LDL at 83      Relevant Orders   Lipid panel     Other   Mood disorder (HCC)   Chronic, improved Upcoming 1 year anniversary from her husband passing Pt reports self d/c of wellbutrin  300xr    08/03/2023    2:00 PM 07/03/2023    3:52 PM 05/03/2023    2:29 PM  PHQ9 SCORE ONLY  PHQ-9 Total Score 5 0 4      08/03/2023    2:00 PM 07/03/2023    3:52 PM 05/03/2023    2:35 PM 11/03/2022    1:33 PM  GAD 7 : Generalized Anxiety Score  Nervous, Anxious, on Edge 0 0 0 1  Control/stop worrying 0 0 0 1  Worry  too much - different things 0 0 1 1  Trouble relaxing 0 0 0 1  Restless 0 0 0 1  Easily annoyed or irritable 0 0 0 1  Afraid - awful might happen 0 0 0 1  Total GAD 7 Score 0 0 1 7  Anxiety Difficulty Not difficult at all Not difficult at all  Not difficult at all          Other Visit Diagnoses       Depression, major, recurrent, moderate (HCC)         Avitaminosis D       Relevant Orders   Vitamin D  (25 hydroxy)     Arthralgia, unspecified joint       Relevant Medications   meloxicam  (MOBIC ) 15 MG tablet     Mixed simple and mucopurulent  chronic bronchitis (HCC)       Relevant Medications   montelukast  (SINGULAIR ) 10 MG tablet     Screening mammogram for breast cancer       Relevant Orders   MM 3D SCREENING MAMMOGRAM BILATERAL BREAST      Return in about 3 months (around 11/01/2023).     LILLETTE Kelly ONEIDA Emilio, FNP, have reviewed all documentation for this visit. The documentation on 08/03/23 for the exam, diagnosis, procedures, and orders are all accurate and complete.  Kelly ONEIDA Emilio, FNP  [facility] [phone] (phone) [phone] (fax)  [facility]

## 2023-08-03 NOTE — Assessment & Plan Note (Signed)
 Chronic, previously stable Repeat TSH Last on synthroid 75 mcg

## 2023-08-03 NOTE — Assessment & Plan Note (Signed)
 Chronic, at goal Continue medications as previously prescribed -norvasc 5 mg  -toprol 50 mg

## 2023-08-03 NOTE — Assessment & Plan Note (Signed)
 Chronic, noted on imaging On crestor  10 mg Continue to recommend tobacco cessation efforts; pt declines assistance at this time Brief discussion regarding risks of tobacco/nicotine  use and recommendations on ways to reduce use and work towards cessation of use of tobacco/nicotine  products. Encouraged to use 1-800-QUIT-NOW.

## 2023-08-03 NOTE — Assessment & Plan Note (Signed)
 Chronic, elevated LDL goal remains <50 Repeat LP Last LDL at 83

## 2023-08-03 NOTE — Patient Instructions (Signed)
 Please call and schedule your mammogram:  [facility]  [address], [address] [facility] [place],  [place]  [address] Main: [phone]  Sunday:Closed Monday:7:20 AM - 5:00 PM Tuesday:7:20 AM - 5:00 PM Wednesday:7:20 AM - 5:00 PM Thursday:7:20 AM - 5:00 PM Friday:7:20 AM - 4:30 PM Saturday:Closed

## 2023-08-03 NOTE — Assessment & Plan Note (Signed)
 Chronic, with neuropathy to great toe Repeat A1c and urine micro albumin Upcoming podiatry appt per pt report Continue to recommend balanced, lower carb meals. Smaller meal size, adding snacks. Choosing water  as drink of choice and increasing purposeful exercise.

## 2023-08-04 LAB — COMPREHENSIVE METABOLIC PANEL
ALT: 13 [IU]/L (ref 0–32)
AST: 14 [IU]/L (ref 0–40)
Albumin: 3.9 g/dL (ref 3.9–4.9)
Alkaline Phosphatase: 157 [IU]/L — ABNORMAL HIGH (ref 44–121)
BUN/Creatinine Ratio: 17 (ref 12–28)
BUN: 11 mg/dL (ref 8–27)
Bilirubin Total: 0.4 mg/dL (ref 0.0–1.2)
CO2: 26 mmol/L (ref 20–29)
Calcium: 9 mg/dL (ref 8.7–10.3)
Chloride: 99 mmol/L (ref 96–106)
Creatinine, Ser: 0.65 mg/dL (ref 0.57–1.00)
Globulin, Total: 2.6 g/dL (ref 1.5–4.5)
Glucose: 308 mg/dL — ABNORMAL HIGH (ref 70–99)
Potassium: 4.5 mmol/L (ref 3.5–5.2)
Sodium: 136 mmol/L (ref 134–144)
Total Protein: 6.5 g/dL (ref 6.0–8.5)
eGFR: 96 mL/min/{1.73_m2} (ref >59)

## 2023-08-04 LAB — HEMOGLOBIN A1C
Est. average glucose Bld gHb Est-mCnc: 246 mg/dL
Hgb A1c MFr Bld: 10.2 % — ABNORMAL HIGH (ref 4.8–5.6)

## 2023-08-04 LAB — CBC WITH DIFFERENTIAL/PLATELET
Basophils Absolute: 0 10*3/uL (ref 0.0–0.2)
Basos: 1 %
EOS (ABSOLUTE): 0.2 10*3/uL (ref 0.0–0.4)
Eos: 4 %
Hematocrit: 42.8 % (ref 34.0–46.6)
Hemoglobin: 13.8 g/dL (ref 11.1–15.9)
Immature Grans (Abs): 0 10*3/uL (ref 0.0–0.1)
Immature Granulocytes: 0 %
Lymphocytes Absolute: 1.8 10*3/uL (ref 0.7–3.1)
Lymphs: 32 %
MCH: 29.5 pg (ref 26.6–33.0)
MCHC: 32.2 g/dL (ref 31.5–35.7)
MCV: 92 fL (ref 79–97)
Monocytes Absolute: 0.5 10*3/uL (ref 0.1–0.9)
Monocytes: 9 %
Neutrophils Absolute: 3.1 10*3/uL (ref 1.4–7.0)
Neutrophils: 54 %
Platelets: 257 10*3/uL (ref 150–450)
RBC: 4.68 x10E6/uL (ref 3.77–5.28)
RDW: 13.1 % (ref 11.7–15.4)
WBC: 5.7 10*3/uL (ref 3.4–10.8)

## 2023-08-04 LAB — MICROALBUMIN / CREATININE URINE RATIO
Creatinine, Urine: 103.4 mg/dL
Microalb/Creat Ratio: 17 mg/g{creat} (ref 0–29)
Microalbumin, Urine: 17.2 ug/mL

## 2023-08-04 LAB — VITAMIN D 25 HYDROXY (VIT D DEFICIENCY, FRACTURES): Vit D, 25-Hydroxy: 23.2 ng/mL — ABNORMAL LOW (ref 30.0–100.0)

## 2023-08-04 LAB — LIPID PANEL
Chol/HDL Ratio: 2.4 {ratio} (ref 0.0–4.4)
Cholesterol, Total: 131 mg/dL (ref 100–199)
HDL: 54 mg/dL (ref >39)
LDL Chol Calc (NIH): 54 mg/dL (ref 0–99)
Triglycerides: 130 mg/dL (ref 0–149)
VLDL Cholesterol Cal: 23 mg/dL (ref 5–40)

## 2023-08-04 LAB — TSH: TSH: 2.1 u[IU]/mL (ref 0.450–4.500)

## 2023-08-04 NOTE — Progress Notes (Signed)
 A1c is now uncontrolled at 10.2. Continue to recommend balanced, lower carb meals. Smaller meal size, adding snacks. Choosing water  as drink of choice and increasing purposeful exercise. Recommend start of metformin  750 xr twice daily with meals and A1c repeat in 3 months. 180# r0 if pt agreeable.  Vit D is back in low range; recommend high dose 5000 IU daily OTC Vit D3 with meal.  Cholesterol remains at goal.  The 10-year ASCVD risk score (Arnett DK, et al., 2019) is: 19.6%  Continue to recommend smoking cessation to assist ASCVD risk at 20% in 10 years.

## 2023-08-05 ENCOUNTER — Encounter: Payer: Self-pay | Admitting: *Deleted

## 2023-08-05 ENCOUNTER — Other Ambulatory Visit: Payer: Self-pay

## 2023-08-05 ENCOUNTER — Ambulatory Visit
Admission: EM | Admit: 2023-08-05 | Discharge: 2023-08-05 | Disposition: A | Discharge status: Home / Self Care | Payer: Medicare PPO | Attending: Emergency Medicine | Admitting: Emergency Medicine

## 2023-08-05 DIAGNOSIS — R300 Dysuria: Secondary | ICD-10-CM | POA: Insufficient documentation

## 2023-08-05 LAB — POCT URINALYSIS DIP (MANUAL ENTRY)
Bilirubin, UA: NEGATIVE
Glucose, UA: >=1000 mg/dL — AB
Ketones, POC UA: NEGATIVE mg/dL
Nitrite, UA: NEGATIVE
Protein Ur, POC: 100 mg/dL — AB
Spec Grav, UA: 1.02 (ref 1.010–1.025)
Urobilinogen, UA: 0.2 U/dL
pH, UA: 7 (ref 5.0–8.0)

## 2023-08-05 MED ORDER — FLUCONAZOLE 150 MG PO TABS: 150.0000 mg | ORAL_TABLET | ORAL | 0 refills | Status: DC | PRN

## 2023-08-05 MED ORDER — CEPHALEXIN 500 MG PO CAPS: 500.0000 mg | ORAL_CAPSULE | Freq: Three times a day (TID) | ORAL | 0 refills | Status: AC

## 2023-08-05 NOTE — ED Provider Notes (Signed)
 Linda Bradford    CSN: [id number] Arrival date & time: 08/05/23  9188      History   Chief Complaint Chief Complaint  Patient presents with   Dysuria    HPI Linda Bradford is a 68 y.o. female.   Patient presents for evaluation of dysuria, urinary frequency and lower abdominal pain and pressure present for 3 days.  Has attempted use of cranberry and Azo but has ran out of medication.  Denies fever, hematuria, flank pain or vaginal symptoms.  Past Medical History:  Diagnosis Date   Anemia    Anxiety    Arthritis    Cancer (HCC)    skin cancer, removed   COPD (chronic obstructive pulmonary disease) (HCC)    Depression    Diabetes mellitus without complication (HCC)    Disordered sleep 01/13/2014   Dyspnea    Dysrhythmia    occasional pvc's. not being treated.   Fatigue    GERD (gastroesophageal reflux disease)    Headache    Heart murmur    Hypertension    Hypothyroidism    Pneumonia    Thyroid  disease    Tremors of nervous system     Patient Active Problem List   Diagnosis Date Noted   Hx of colonic polyps 07/05/2023   Polyp of colon 07/05/2023   Great toe pain, left 07/03/2023   Fall 05/03/2023   Diarrhea due to drug 05/03/2023   Mood disorder (HCC) 05/03/2023   Centrilobular emphysema (HCC) 01/18/2023   Aortic valve calcification 01/18/2023   Colon cancer screening 01/18/2023   Essential tremor 01/18/2023   Aortic atherosclerosis (HCC) 01/18/2023   DDD (degenerative disc disease), lumbosacral 01/18/2023   Diabetes mellitus type 2 with neurological manifestations (HCC) 07/12/2022   Hyponatremia 06/30/2022   Nodule of left lung 09/20/2021   Hyperlipidemia associated with type 2 diabetes mellitus (HCC) 09/02/2021   Hypertension associated with type 2 diabetes mellitus (HCC) 09/02/2021   Adult hypothyroidism 12/17/2013   Current tobacco use 12/17/2013    Past Surgical History:  Procedure Laterality Date   APPENDECTOMY     CHOLECYSTECTOMY N/A  07/01/2019   Procedure: LAPAROSCOPIC CHOLECYSTECTOMY;  Surgeon: Desiderio Schanz, MD;  Location: [facility];  Service: General;  Laterality: N/A;   COLECTOMY WITH COLOSTOMY CREATION/HARTMANN PROCEDURE N/A 09/03/2018   Error, this was not done.   COLONOSCOPY WITH PROPOFOL  N/A 03/08/2019   Procedure: COLONOSCOPY WITH PROPOFOL ;  Surgeon: Janalyn Keene NOVAK, MD;  Location: [facility];  Service: Endoscopy;  Laterality: N/A;   COLONOSCOPY WITH PROPOFOL  N/A 07/05/2023   Procedure: COLONOSCOPY WITH PROPOFOL ;  Surgeon: Unk Corinn Skiff, MD;  Location: [facility]COPY;  Service: Gastroenterology;  Laterality: N/A;   CYSTOSCOPY WITH STENT PLACEMENT Bilateral 09/03/2018   Procedure: CYSTOSCOPY WITH STENT PLACEMENT-LIGHTED STENTS;  Surgeon: Penne Knee, MD;  Location: [facility];  Service: Urology;  Laterality: Bilateral;   LAPAROSCOPIC SIGMOID COLECTOMY N/A 09/03/2018   Procedure: LAPAROSCOPIC SIGMOID COLECTOMY;  Surgeon: Desiderio Schanz, MD;  Location: [facility];  Service: General;  Laterality: N/A;   percutaneous drainage tube  07/2018   2nd tube placed. not healing in colon, causing a fistula   POLYPECTOMY  07/05/2023   Procedure: POLYPECTOMY;  Surgeon: Unk Corinn Skiff, MD;  Location: [facility] ENDOSCOPY;  Service: Gastroenterology;;   TONSILLECTOMY     TOTAL KNEE ARTHROPLASTY Left 05/10/2021   Procedure: TOTAL KNEE ARTHROPLASTY;  Surgeon: Leora Lynwood SAUNDERS, MD;  Location: [facility];  Service: Orthopedics;  Laterality: Left;   TOTAL KNEE ARTHROPLASTY Right 01/10/2022  Procedure: TOTAL KNEE ARTHROPLASTY;  Surgeon: Leora Lynwood SAUNDERS, MD;  Location: [facility];  Service: Orthopedics;  Laterality: Right;   TUBAL LIGATION      OB History     Gravida  2   Para  2   Term  2   Preterm      AB      Living  2      SAB      IAB      Ectopic      Multiple      Live Births               Home Medications    Prior to Admission medications   Medication Sig Start Date End Date Taking? Authorizing  Provider  acetaminophen  (TYLENOL ) 500 MG tablet Take 1,000 mg by mouth every 6 (six) hours as needed for mild pain or moderate pain.    Yes [provider]  albuterol  (VENTOLIN  HFA) 108 (90 Base) MCG/ACT inhaler Inhale into the lungs. 02/22/23  Yes [provider]  amLODipine  (NORVASC ) 5 MG tablet TAKE 1 TABLET(5 MG) BY MOUTH DAILY 07/04/23  Yes Emilio Kelly DASEN, FNP  aspirin  81 MG chewable tablet Chew 1 tablet (81 mg total) by mouth 2 (two) times daily. 01/13/22  Yes Joshua Lin, PA-C  cephALEXin  (KEFLEX ) 500 MG capsule Take 1 capsule (500 mg total) by mouth 3 (three) times daily for 5 days. 08/05/23 08/10/23 Yes Missie Gehrig R, NP  Cholecalciferol  (VITAMIN D ) 50 MCG (2000 UT) tablet Take 2,000 Units by mouth daily.   Yes [provider]  diclofenac  Sodium (VOLTAREN ) 1 % GEL    Yes [provider]  DULoxetine  (CYMBALTA ) 60 MG capsule Take 1 capsule (60 mg total) by mouth 2 (two) times daily. 07/03/23  Yes Wellington Beams A, FNP  ezetimibe  (ZETIA ) 10 MG tablet Take 1 tablet (10 mg total) by mouth daily. 06/13/23 09/11/23 Yes Gollan, Timothy J, MD  fluconazole  (DIFLUCAN ) 150 MG tablet Take 1 tablet (150 mg total) by mouth every three (3) days as needed for up to 2 doses. 08/05/23  Yes Timohty Renbarger R, NP  fluticasone  (FLONASE ) 50 MCG/ACT nasal spray USE 2 SPRAYS IN BOTH NOSTRILS DAILY. 06/14/23  Yes Emilio Kelly T, FNP  Fluticasone -Umeclidin-Vilant (TRELEGY ELLIPTA ) 100-62.5-25 MCG/ACT AEPB Inhale 1 puff into the lungs daily. 08/19/22  Yes Emilio Kelly DASEN, FNP  gabapentin  (NEURONTIN ) 300 MG capsule Take 1 capsule (300 mg total) by mouth 2 (two) times daily. Take 2 capsules PO every morning; Take 3 capsules PO every evening. 11/03/22  Yes Emilio Kelly DASEN, FNP  levothyroxine  (SYNTHROID ) 75 MCG tablet Take 1 tablet (75 mcg total) by mouth daily before breakfast. 08/17/22  Yes Emilio Kelly DASEN, FNP  meloxicam  (MOBIC ) 15 MG tablet Take 1 tablet (15 mg total) by mouth daily. 08/03/23   Yes Emilio Kelly DASEN, FNP  metoprolol  succinate (TOPROL -XL) 50 MG 24 hr tablet Take 1 tablet (50 mg total) by mouth daily. 01/18/23  Yes Emilio Kelly DASEN, FNP  montelukast  (SINGULAIR ) 10 MG tablet Take 1 tablet (10 mg total) by mouth at bedtime. 08/03/23  Yes Emilio Kelly T, FNP  nystatin  (MYCOSTATIN ) 100000 UNIT/ML suspension Take 5 mLs (500,000 Units total) by mouth 4 (four) times daily. 10/20/22  Yes Emilio Kelly T, FNP  pantoprazole  (PROTONIX ) 20 MG tablet TAKE 1 TABLET(20 MG) BY MOUTH TWICE DAILY BEFORE A MEAL 06/20/23  Yes Emilio Kelly T, FNP  primidone  (MYSOLINE ) 50 MG tablet Take 0.5 tablets (25 mg  total) by mouth 2 (two) times daily. 07/01/22  Yes Laurita Pillion, MD  rosuvastatin  (CRESTOR ) 10 MG tablet TAKE 1 TABLET (10 MG TOTAL) BY MOUTH DAILY. 06/07/23  Yes Emilio Kelly DASEN, FNP  traMADol  (ULTRAM ) 50 MG tablet Take 50 mg by mouth 2 (two) times daily as needed for moderate pain. 09/29/22  Yes [provider]  blood glucose meter kit and supplies Dispense based on patient and insurance preference. Once daily. (FOR ICD-10 E10.9, E11.9). 07/12/22   Emilio Kelly DASEN, FNP  buPROPion  (WELLBUTRIN  XL) 300 MG 24 hr tablet Take 1 tablet (300 mg total) by mouth daily. 08/17/22   Emilio Kelly DASEN, FNP  glucose blood (CONTOUR NEXT TEST) test strip To check blood sugar once daily 06/09/17   Vivienne Delon HERO, PA-C  ipratropium-albuterol  (DUONEB) 0.5-2.5 (3) MG/3ML SOLN Take 3 mLs by nebulization every 6 (six) hours as needed. 08/29/22   Emilio Kelly DASEN, FNP  loperamide  (IMODIUM  A-D) 2 MG tablet Take 2-4 mg by mouth 4 (four) times daily as needed for diarrhea or loose stools.    [provider]  metoCLOPramide  (REGLAN ) 10 MG tablet TAKE 1 TABLET(10 MG) BY MOUTH EVERY 8 HOURS AS NEEDED FOR NAUSEA 01/31/23   Emilio Kelly DASEN, FNP  nystatin  (MYCOSTATIN /NYSTOP ) powder Apply 1 application topically 3 (three) times daily. 10/01/21   Emilio Kelly DASEN, FNP  oxybutynin  (DITROPAN -XL) 5 MG 24 hr tablet Take 1 tablet (5 mg  total) by mouth at bedtime. 08/18/22   Starla Harland BROCKS, MD    Family History Family History  Problem Relation Age of Onset   Lung cancer Mother    Alcohol abuse Sister    Arthritis Sister    Alcohol abuse Brother    Heart disease Brother 58   Heart attack Brother    Pancreatic cancer Maternal Aunt    Diabetes Maternal Aunt    Alcohol abuse Son    Drug abuse Son    Drug abuse Son    Heart disease Father 24   Diabetes Sister    Breast cancer Brother    Alcohol abuse Maternal Uncle    Alcohol abuse Maternal Grandmother    Diabetes Maternal Grandmother     Social History Social History   Tobacco Use   Smoking status: Every Day    Current packs/day: 2.00    Average packs/day: 2.0 packs/day for 45.2 years (90.4 ttl pk-yrs)    Types: Cigarettes    Start date: 03/02/1980   Smokeless tobacco: Never   Tobacco comments:    1.5PPD 05/16/2022  Vaping Use   Vaping status: Former   Devices: only for a month. did not like  Substance Use Topics   Alcohol use: No    Alcohol/week: 0.0 standard drinks of alcohol   Drug use: No     Allergies   Metformin  and related and Dexilant  [dexlansoprazole ]   Review of Systems Review of Systems   Physical Exam Triage Vital Signs ED Triage Vitals  Encounter Vitals Group     BP 08/05/23 0820 (!) 165/78     Systolic BP Percentile --      Diastolic BP Percentile --      Pulse Rate 08/05/23 0820 88     Resp 08/05/23 0820 (!) 22     Temp 08/05/23 0820 98.4 F (36.9 C)     Temp Source 08/05/23 0820 Oral     SpO2 08/05/23 0820 92 %     Weight --      Height --  Head Circumference --      Peak Flow --      Pain Score 08/05/23 0822 8     Pain Loc --      Pain Education --      Exclude from Growth Chart --    No data found.  Updated Vital Signs BP (!) 165/78 Comment: states has not taken HTN meds yet  Pulse 88   Temp 98.4 F (36.9 C) (Oral)   Resp (!) 22   SpO2 92%   Visual Acuity Right Eye Distance:   Left Eye Distance:    Bilateral Distance:    Right Eye Near:   Left Eye Near:    Bilateral Near:     Physical Exam Constitutional:      Appearance: Normal appearance.  Eyes:     Extraocular Movements: Extraocular movements intact.  Pulmonary:     Effort: Pulmonary effort is normal.  Abdominal:     Tenderness: There is no abdominal tenderness. There is no right CVA tenderness, left CVA tenderness or guarding.  Neurological:     Mental Status: She is alert and oriented to person, place, and time. Mental status is at baseline.      UC Treatments / Results  Labs (all labs ordered are listed, but only abnormal results are displayed) Labs Reviewed  POCT URINALYSIS DIP (MANUAL ENTRY) - Abnormal; Notable for the following components:      Result Value   Clarity, UA cloudy (*)    Glucose, UA >=1,000 (*)    Blood, UA large (*)    Protein Ur, POC =100 (*)    Leukocytes, UA Large (3+) (*)    All other components within normal limits  URINE CULTURE    EKG   Radiology No results found.  Procedures Procedures (including critical care time)  Medications Ordered in UC Medications - No data to display  Initial Impression / Assessment and Plan / UC Course  I have reviewed the triage vital signs and the nursing notes.  Pertinent labs & imaging results that were available during my care of the patient were reviewed by me and considered in my medical decision making (see chart for details).  Dysuria  Urinalysis showing leukocytes, negative for nitrates, sent for culture, discussed findings with patient, initiating antibiotic as she is symptomatic, cephalexin  prescribed as well as Diflucan  as she endorses yeast with antibiotic use, recommended supportive care and advised follow-up if symptoms persist worsen or recur Final Clinical Impressions(s) / UC Diagnoses   Final diagnoses:  Dysuria     Discharge Instructions      Your urinalysis shows Linda Bradford blood cells but does not currently show  bacteria, your urine will be sent to the lab to determine exactly which bacteria is present, if any changes need to be made to your medications you will be notified  Begin use of cephalexin  every 8 hours for 5 days  You may use over-the-counter Azo to help minimize your symptoms until antibiotic removes bacteria, this medication will turn your urine orange  Increase your fluid intake through use of water   As always practice good hygiene, wiping front to back and avoidance of scented vaginal products to prevent further irritation  If symptoms continue to persist after use of medication or recur please follow-up with urgent care or your primary doctor as needed    ED Prescriptions     Medication Sig Dispense Auth. Provider   cephALEXin  (KEFLEX ) 500 MG capsule Take 1 capsule (500 mg total) by  mouth 3 (three) times daily for 5 days. 15 capsule Promise Bushong R, NP   fluconazole  (DIFLUCAN ) 150 MG tablet Take 1 tablet (150 mg total) by mouth every three (3) days as needed for up to 2 doses. 2 tablet Blakleigh Straw R, NP      PDMP not reviewed this encounter.   Teresa Shelba SAUNDERS, [place] 08/05/23 [phone]

## 2023-08-05 NOTE — Discharge Instructions (Addendum)
 Your urinalysis shows Linda Bradford blood cells but does not currently show bacteria, your urine will be sent to the lab to determine exactly which bacteria is present, if any changes need to be made to your medications you will be notified  Begin use of cephalexin  every 8 hours for 5 days  You may use over-the-counter Azo to help minimize your symptoms until antibiotic removes bacteria, this medication will turn your urine orange  Increase your fluid intake through use of water   As always practice good hygiene, wiping front to back and avoidance of scented vaginal products to prevent further irritation  If symptoms continue to persist after use of medication or recur please follow-up with urgent care or your primary doctor as needed

## 2023-08-05 NOTE — ED Triage Notes (Signed)
 C/O bladder discomfort and dysuria, urinary urgency and polyuria onset 3 days ago. Denies fevers. Has taken Tyl.

## 2023-08-08 LAB — URINE CULTURE

## 2023-08-16 ENCOUNTER — Encounter: Payer: Self-pay | Admitting: Physician Assistant

## 2023-08-16 ENCOUNTER — Ambulatory Visit (INDEPENDENT_AMBULATORY_CARE_PROVIDER_SITE_OTHER): Payer: Medicare PPO | Admitting: Physician Assistant

## 2023-08-16 VITALS — BP 132/51 | HR 76 | Temp 97.6°F | Resp 16 | Wt 226.0 lb

## 2023-08-16 DIAGNOSIS — I10 Essential (primary) hypertension: Secondary | ICD-10-CM | POA: Diagnosis not present

## 2023-08-16 DIAGNOSIS — Z7984 Long term (current) use of oral hypoglycemic drugs: Secondary | ICD-10-CM

## 2023-08-16 DIAGNOSIS — J42 Unspecified chronic bronchitis: Secondary | ICD-10-CM

## 2023-08-16 DIAGNOSIS — E119 Type 2 diabetes mellitus without complications: Secondary | ICD-10-CM | POA: Diagnosis not present

## 2023-08-16 DIAGNOSIS — R300 Dysuria: Secondary | ICD-10-CM | POA: Diagnosis not present

## 2023-08-16 DIAGNOSIS — F17200 Nicotine dependence, unspecified, uncomplicated: Secondary | ICD-10-CM

## 2023-08-16 DIAGNOSIS — J069 Acute upper respiratory infection, unspecified: Secondary | ICD-10-CM | POA: Diagnosis not present

## 2023-08-16 DIAGNOSIS — I152 Hypertension secondary to endocrine disorders: Secondary | ICD-10-CM

## 2023-08-16 DIAGNOSIS — E1149 Type 2 diabetes mellitus with other diabetic neurological complication: Secondary | ICD-10-CM

## 2023-08-16 DIAGNOSIS — J209 Acute bronchitis, unspecified: Secondary | ICD-10-CM

## 2023-08-16 DIAGNOSIS — F17210 Nicotine dependence, cigarettes, uncomplicated: Secondary | ICD-10-CM

## 2023-08-16 LAB — POCT URINALYSIS DIPSTICK
Bilirubin, UA: NEGATIVE
Glucose, UA: POSITIVE — AB
Ketones, UA: NEGATIVE
Nitrite, UA: POSITIVE
Protein, UA: NEGATIVE
Spec Grav, UA: 1.025 (ref 1.010–1.025)
Urobilinogen, UA: 0.2 U/dL
pH, UA: 5 (ref 5.0–8.0)

## 2023-08-16 MED ORDER — FLUCONAZOLE 150 MG PO TABS: 150.0000 mg | ORAL_TABLET | ORAL | 0 refills | Status: DC | PRN

## 2023-08-16 MED ORDER — AMOXICILLIN 875 MG PO TABS: 875.0000 mg | ORAL_TABLET | Freq: Two times a day (BID) | ORAL | 0 refills | Status: AC

## 2023-08-16 NOTE — Progress Notes (Signed)
 Established patient visit  Patient: Linda Bradford   DOB: [birth year]   68 y.o. Female  MRN: [id number] Visit Date: 08/16/2023  Today's healthcare provider: Blane Bunting, PA-C   Chief Complaint  Patient presents with   Possible Urinary tract infection   Cough   Subjective       Discussed the use of AI scribe software for clinical note transcription with the patient, who gave verbal consent to proceed.  History of Present Illness   The patient, with a history of COPD, emphysema, and recurrent UTIs, presents with a persistent cough, lower abdominal pain, and painful urination. The cough, which started a week ago, is associated with congestion, nasal drip, runny nose, and some wheezing. The cough is worse at night and has led to occasional incontinence. The patient also reports lower abdominal pain and painful urination, symptoms similar to a previous UTI for which she was treated with Keflex . The patient also reports a pressure sensation in the lower abdomen. Despite treatment, the symptoms have returned. The patient also has a history of diabetes and is currently on metformin , but reports issues with diarrhea and potential hypoglycemia. The patient is a current smoker, smoking about a pack and a half a day for the past 50 years.           08/03/2023    2:00 PM 07/03/2023    3:52 PM 05/03/2023    2:29 PM  Depression screen PHQ 2/9  Decreased Interest 0 0 0  Down, Depressed, Hopeless 0 0 0  PHQ - 2 Score 0 0 0  Altered sleeping 3 0 2  Tired, decreased energy 2 0 0  Change in appetite 0 0 2  Feeling bad or failure about yourself  0 0 0  Trouble concentrating 0 0 0  Moving slowly or fidgety/restless 0 0 0  Suicidal thoughts 0 0 0  PHQ-9 Score 5 0 4  Difficult doing work/chores  Not difficult at all       08/03/2023    2:00 PM 07/03/2023    3:52 PM 05/03/2023    2:35 PM 11/03/2022    1:33 PM  GAD 7 : Generalized Anxiety Score  Nervous, Anxious, on Edge 0 0 0 1  Control/stop worrying 0  0 0 1  Worry too much - different things 0 0 1 1  Trouble relaxing 0 0 0 1  Restless 0 0 0 1  Easily annoyed or irritable 0 0 0 1  Afraid - awful might happen 0 0 0 1  Total GAD 7 Score 0 0 1 7  Anxiety Difficulty Not difficult at all Not difficult at all  Not difficult at all    Medications: Outpatient Medications Prior to Visit  Medication Sig   acetaminophen  (TYLENOL ) 500 MG tablet Take 1,000 mg by mouth every 6 (six) hours as needed for mild pain or moderate pain.    albuterol  (VENTOLIN  HFA) 108 (90 Base) MCG/ACT inhaler Inhale into the lungs.   amLODipine  (NORVASC ) 5 MG tablet TAKE 1 TABLET(5 MG) BY MOUTH DAILY   aspirin  81 MG chewable tablet Chew 1 tablet (81 mg total) by mouth 2 (two) times daily.   blood glucose meter kit and supplies Dispense based on patient and insurance preference. Once daily. (FOR ICD-10 E10.9, E11.9).   buPROPion  (WELLBUTRIN  XL) 300 MG 24 hr tablet Take 1 tablet (300 mg total) by mouth daily.   Cholecalciferol  (VITAMIN D ) 50 MCG (2000 UT) tablet Take 2,000 Units by mouth daily.   diclofenac   Sodium (VOLTAREN ) 1 % GEL    DULoxetine  (CYMBALTA ) 60 MG capsule Take 1 capsule (60 mg total) by mouth 2 (two) times daily.   ezetimibe  (ZETIA ) 10 MG tablet Take 1 tablet (10 mg total) by mouth daily.   fluticasone  (FLONASE ) 50 MCG/ACT nasal spray USE 2 SPRAYS IN BOTH NOSTRILS DAILY.   Fluticasone -Umeclidin-Vilant (TRELEGY ELLIPTA ) 100-62.5-25 MCG/ACT AEPB Inhale 1 puff into the lungs daily.   gabapentin  (NEURONTIN ) 300 MG capsule Take 1 capsule (300 mg total) by mouth 2 (two) times daily. Take 2 capsules PO every morning; Take 3 capsules PO every evening.   glucose blood (CONTOUR NEXT TEST) test strip To check blood sugar once daily   ipratropium-albuterol  (DUONEB) 0.5-2.5 (3) MG/3ML SOLN Take 3 mLs by nebulization every 6 (six) hours as needed.   levothyroxine  (SYNTHROID ) 75 MCG tablet Take 1 tablet (75 mcg total) by mouth daily before breakfast.   loperamide  (IMODIUM   A-D) 2 MG tablet Take 2-4 mg by mouth 4 (four) times daily as needed for diarrhea or loose stools.   meloxicam  (MOBIC ) 15 MG tablet Take 1 tablet (15 mg total) by mouth daily.   metoCLOPramide  (REGLAN ) 10 MG tablet TAKE 1 TABLET(10 MG) BY MOUTH EVERY 8 HOURS AS NEEDED FOR NAUSEA   metoprolol  succinate (TOPROL -XL) 50 MG 24 hr tablet Take 1 tablet (50 mg total) by mouth daily.   montelukast  (SINGULAIR ) 10 MG tablet Take 1 tablet (10 mg total) by mouth at bedtime.   nystatin  (MYCOSTATIN ) 100000 UNIT/ML suspension Take 5 mLs (500,000 Units total) by mouth 4 (four) times daily.   nystatin  (MYCOSTATIN /NYSTOP ) powder Apply 1 application topically 3 (three) times daily.   oxybutynin  (DITROPAN -XL) 5 MG 24 hr tablet Take 1 tablet (5 mg total) by mouth at bedtime.   pantoprazole  (PROTONIX ) 20 MG tablet TAKE 1 TABLET(20 MG) BY MOUTH TWICE DAILY BEFORE A MEAL   primidone  (MYSOLINE ) 50 MG tablet Take 0.5 tablets (25 mg total) by mouth 2 (two) times daily.   rosuvastatin  (CRESTOR ) 10 MG tablet TAKE 1 TABLET (10 MG TOTAL) BY MOUTH DAILY.   traMADol  (ULTRAM ) 50 MG tablet Take 50 mg by mouth 2 (two) times daily as needed for moderate pain.   [DISCONTINUED] fluconazole  (DIFLUCAN ) 150 MG tablet Take 1 tablet (150 mg total) by mouth every three (3) days as needed for up to 2 doses.   No facility-administered medications prior to visit.    Review of Systems All negative Except see HPI       Objective    BP (!) 132/51 (BP Location: Left Arm, Patient Position: Sitting, Cuff Size: Large)   Pulse 76   Temp 97.6 F (36.4 C) (Oral)   Resp 16   Wt 226 lb (102.5 kg)   SpO2 97%   BMI 32.43 kg/m     Physical Exam   Results for orders placed or performed in visit on 08/16/23  POCT urinalysis dipstick  Result Value Ref Range   Color, UA Yellow    Clarity, UA Cloudy    Glucose, UA Positive (A) Negative   Bilirubin, UA Negative    Ketones, UA Negative    Spec Grav, UA 1.025 1.010 - 1.025   Blood, UA  Trace    pH, UA 5.0 5.0 - 8.0   Protein, UA Negative Negative   Urobilinogen, UA 0.2 0.2 or 1.0 E.U./dL   Nitrite, UA Positive    Leukocytes, UA Small (1+) (A) Negative   Appearance     Odor  Assessment and Plan    Upper Respiratory Infection Cough, congestion, nasal drip, runny nose, and wheezing since last week. No shortness of breath or fever. Cough is worse at night. Post nasal drainage observed during examination. -Continue Trelegy daily and Albuterol  as a rescue inhaler. -Use Flonase  twice daily after saline spray. -Consider Mucinex  for cough. If cough persist, contact me for a different medication. in the past, tussionex worked well. -Start antibiotic therapy for possible bronchitis or pneumonia.  Dysuria/Urinary Tract Infection Lower abdominal pain, pressure in lower abdomen, and painful urination. Previously treated with Keflex  for possible UTI. Incontinence noted, possibly contributing to recurrent UTIs. -Start antibiotic therapy, pending urine culture and sensitivity results. -Prescribe Diflucan  for possible yeast infection secondary to antibiotic use. - POCT urinalysis dipstick - Urine Microscopic - Urine Culture  Acute exacerbation of chronic bronchitis History of COPD with current exacerbation. Patient continues to smoke 1.5 packs per day. -Continue Trelegy daily and Albuterol  as a rescue inhaler. -Consider antibiotic therapy for possible bronchitis or pneumonia. if symptoms persist, do CXR - amoxicillin  (AMOXIL ) 875 MG tablet; Take 1 tablet (875 mg total) by mouth 2 (two) times daily for 10 days.  Dispense: 20 tablet; Refill: 0 - fluconazole  (DIFLUCAN ) 150 MG tablet; Take 1 tablet (150 mg total) by mouth every three (3) days as needed for up to 2 doses.  Dispense: 2 tablet; Refill: 0  Type 2 Diabetes Mellitus chronic Elevated A1c, currently on Metformin  with reported side effect of diarrhea. Blood glucose levels vary from 130 to 200. -Continue  Metformin . -Consider Glipizide for rapid blood sugar control. -Start 14-day continuous glucose monitoring, sample provided -Encourage diet control and hydration.  Hypertension chronic No current complaints or changes in management. -Continue current management.  Smoking Smoking cessation was addressed  Patient was advised to quit smoking  Patient seems reluctant. Will revisit  Tinnitus chronic Constant ringing in ears, no pain or popping sensation. -No change in management. General Health Maintenance / Followup Plans -Encourage smoking cessation. -Follow up at the end of the week to assess response to antibiotic therapy. -Notify primary care provider about intolerance to Metformin .     Orders Placed This Encounter  Procedures   Urine Culture   Urine Microscopic   POCT urinalysis dipstick    No follow-ups on file.   The patient was advised to call back or seek an in-person evaluation if the symptoms worsen or if the condition fails to improve as anticipated.  I discussed the assessment and treatment plan with the patient. The patient was provided an opportunity to ask questions and all were answered. The patient agreed with the plan and demonstrated an understanding of the instructions.  I, Jaaziel Peatross, PA-C have reviewed all documentation for this visit. The documentation on 08/16/2023  for the exam, diagnosis, procedures, and orders are all accurate and complete.  Blane Bunting, [facility], MMS [facility] [phone] (phone) [phone] (fax)  [facility]

## 2023-08-17 LAB — URINALYSIS, MICROSCOPIC ONLY

## 2023-08-19 ENCOUNTER — Other Ambulatory Visit: Payer: Self-pay | Admitting: Obstetrics & Gynecology

## 2023-08-19 DIAGNOSIS — N3946 Mixed incontinence: Secondary | ICD-10-CM

## 2023-08-19 LAB — URINE CULTURE

## 2023-08-21 ENCOUNTER — Encounter: Payer: Self-pay | Admitting: Physician Assistant

## 2023-08-24 DIAGNOSIS — M542 Cervicalgia: Secondary | ICD-10-CM | POA: Diagnosis not present

## 2023-08-24 DIAGNOSIS — M48061 Spinal stenosis, lumbar region without neurogenic claudication: Secondary | ICD-10-CM | POA: Diagnosis not present

## 2023-08-24 DIAGNOSIS — M791 Myalgia, unspecified site: Secondary | ICD-10-CM | POA: Diagnosis not present

## 2023-08-24 DIAGNOSIS — M47896 Other spondylosis, lumbar region: Secondary | ICD-10-CM | POA: Diagnosis not present

## 2023-08-24 DIAGNOSIS — Z5181 Encounter for therapeutic drug level monitoring: Secondary | ICD-10-CM | POA: Diagnosis not present

## 2023-08-24 DIAGNOSIS — M51369 Other intervertebral disc degeneration, lumbar region without mention of lumbar back pain or lower extremity pain: Secondary | ICD-10-CM | POA: Diagnosis not present

## 2023-08-24 DIAGNOSIS — Z79899 Other long term (current) drug therapy: Secondary | ICD-10-CM | POA: Diagnosis not present

## 2023-08-24 DIAGNOSIS — M545 Low back pain, unspecified: Secondary | ICD-10-CM | POA: Diagnosis not present

## 2023-08-29 ENCOUNTER — Telehealth: Payer: Self-pay | Admitting: Family Medicine

## 2023-08-29 ENCOUNTER — Other Ambulatory Visit: Payer: Self-pay | Admitting: Family Medicine

## 2023-08-29 DIAGNOSIS — E039 Hypothyroidism, unspecified: Secondary | ICD-10-CM

## 2023-08-29 MED ORDER — LEVOTHYROXINE SODIUM 75 MCG PO TABS: 75.0000 ug | ORAL_TABLET | Freq: Every day | ORAL | 3 refills | Status: DC

## 2023-08-29 NOTE — Telephone Encounter (Signed)
[facility] faxed refill request for the following medications:   levothyroxine (SYNTHROID) 75 MCG tablet     Please advise.

## 2023-08-30 ENCOUNTER — Other Ambulatory Visit: Payer: Self-pay | Admitting: Family Medicine

## 2023-08-30 DIAGNOSIS — I10 Essential (primary) hypertension: Secondary | ICD-10-CM

## 2023-08-30 DIAGNOSIS — G250 Essential tremor: Secondary | ICD-10-CM

## 2023-08-30 NOTE — Telephone Encounter (Signed)
Medication Refill -  Most Recent Primary Care Visit:  Provider: Debera Lat  Department: [facility]  Visit Type: OFFICE VISIT  Date: 08/16/2023  Medication: metoprolol succinate (TOPROL-XL) 50 MG 24 hr tablet  Fluticasone-Umeclidin-Vilant (TRELEGY ELLIPTA) 100-62.5-25 MCG/ACT AEPB   Has the patient contacted their pharmacy? Yes Pharmacy was supposed to fax over request.  Is this the correct pharmacy for this prescription? Yes This is the patient's preferred pharmacy:  [facility] - [place], [place] - [address] [place] [address] Phone: [phone] Fax: [phone]  Has the prescription been filled recently? No  Is the patient out of the medication? No  Has the patient been seen for an appointment in the last year OR does the patient have an upcoming appointment? Yes  Can we respond through MyChart? Yes  Agent: Please be advised that Rx refills may take up to 3 business days. We ask that you follow-up with your pharmacy.

## 2023-08-31 MED ORDER — TRELEGY ELLIPTA 100-62.5-25 MCG/ACT IN AEPB: 1.0000 | INHALATION_SPRAY | Freq: Every day | RESPIRATORY_TRACT | 11 refills | Status: DC

## 2023-08-31 NOTE — Telephone Encounter (Signed)
Requested medication (s) are due for refill today- yes  Requested medication (s) are on the active medication list -yes  Future visit scheduled -yes  Last refill: 08/19/22 1 each 11RF  Notes to clinic: off protocol- provider review   Requested Prescriptions  Pending Prescriptions Disp Refills   Fluticasone-Umeclidin-Vilant (TRELEGY ELLIPTA) 100-62.5-25 MCG/ACT AEPB 1 each 11    Sig: Inhale 1 puff into the lungs daily.     Off-Protocol Failed - 08/31/2023  3:45 PM      Failed - Medication not assigned to a protocol, review manually.      Passed - Valid encounter within last 12 months    Recent Outpatient Visits           2 weeks ago Dysuria   [place] [facility] [place], [place], PA-C   4 weeks ago Hypertension associated with type 2 diabetes mellitus [facility])   [place] [facility] Jacky Kindle, FNP   1 month ago Great toe pain, left   [facility] [place], [place] A, FNP   4 months ago Diarrhea due to drug   [facility] Merita Norton T, FNP   7 months ago Diabetes mellitus type 2 with neurological manifestations [facility])   [facility] Merita Norton T, FNP       Future Appointments             In 2 months Pardue, Monico Blitz, DO [place] [facility], [facility]            Refused Prescriptions Disp Refills   metoprolol succinate (TOPROL-XL) 50 MG 24 hr tablet 90 tablet 3    Sig: Take 1 tablet (50 mg total) by mouth daily.     Cardiovascular:  Beta Blockers Passed - 08/31/2023  3:45 PM      Passed - Last BP in normal range    BP Readings from Last 1 Encounters:  08/16/23 (!) 132/51         Passed - Last Heart Rate in normal range    Pulse Readings from Last 1 Encounters:  08/16/23 76         Passed - Valid encounter within last 6 months    Recent Outpatient Visits           2 weeks ago Dysuria   [place] [facility]  [place], PA-C   4 weeks ago Hypertension associated with type 2 diabetes mellitus [facility])   [place] [facility] Jacky Kindle, FNP   1 month ago Great toe pain, left   [place] [facility] [place], [place] A, FNP   4 months ago Diarrhea due to drug   [facility] Mer[facility] Norton T, FNP   7 months ago Diabetes mellitus type 2 with neurological manifestations [facility])   [place] [facility] Jacky Kindle, FNP       Future Appointments             In 2 months Pardue, Monico Blitz, DO [place] [facility], [facility]               Requested Prescriptions  Pending Prescriptions Disp Refills   Fluticasone-Umeclidin-Vilant (TRELEGY ELLIPTA) 100-62.5-25 MCG/ACT AEPB 1 each 11    Sig: Inhale 1 puff into the lungs daily.     Off-Protocol Failed - 08/31/2023  3:45 PM      Failed - Medication not  assigned to a protocol, review manually.      Passed - Valid encounter within last 12 months    Recent Outpatient Visits           2 weeks ago Dysuria   [place] [facility] [place], [place], PA-C   4 weeks ago Hypertension associated with type 2 diabetes mellitus [facility])   [place] [facility] Jacky Kindle, FNP   1 month ago Great toe pain, left   [facility] [place], [place] A, FNP   4 months ago Diarrhea due to drug   [facility] Merita Norton T, FNP   7 months ago Diabetes mellitus type 2 with neurological manifestations [facility])   [place] [facility] Merita Norton T, FNP       Future Appointments             In 2 months Pardue, Monico Blitz, DO [facility], [facility]            Refused Prescriptions Disp Refills   metoprolol succinate (TOPROL-XL) 50 MG 24 hr tablet 90 tablet 3    Sig: Take 1 tablet (50 mg total) by mouth daily.     Cardiovascular:  Beta Blockers  Passed - 08/31/2023  3:45 PM      Passed - Last BP in normal range    BP Readings from Last 1 Encounters:  08/16/23 (!) 132/51         Passed - Last Heart Rate in normal range    Pulse Readings from Last 1 Encounters:  08/16/23 76         Passed - Valid encounter within last 6 months    Recent Outpatient Visits           2 weeks ago Dysuria   [place] [facility] [place], [place], PA-C   4 weeks ago Hypertension associated with type 2 diabetes mellitus [facility])   [place] [facility] Jacky Kindle, FNP   1 month ago Great toe pain, left   [facility] [place], [place] A, FNP   4 months ago Diarrhea due to drug   [facility] Merita Norton T, FNP   7 months ago Diabetes mellitus type 2 with neurological manifestations [facility])   [place] [facility] Jacky Kindle, FNP       Future Appointments             In 2 months Pardue, Monico Blitz, DO [place] [facility] [place], [facility]

## 2023-08-31 NOTE — Telephone Encounter (Signed)
Rx 01/18/23 #90 3RF- 1 year supply Requested Prescriptions  Pending Prescriptions Disp Refills   metoprolol succinate (TOPROL-XL) 50 MG 24 hr tablet 90 tablet 3    Sig: Take 1 tablet (50 mg total) by mouth daily.     Cardiovascular:  Beta Blockers Passed - 08/31/2023  3:43 PM      Passed - Last BP in normal range    BP Readings from Last 1 Encounters:  08/16/23 (!) 132/51         Passed - Last Heart Rate in normal range    Pulse Readings from Last 1 Encounters:  08/16/23 76         Passed - Valid encounter within last 6 months    Recent Outpatient Visits           2 weeks ago Dysuria   [place] [facility] [place], [place], PA-C   4 weeks ago Hypertension associated with type 2 diabetes mellitus [facility])   [place] [facility] Jacky Kindle, FNP   1 month ago Great toe pain, left   [facility] [place] [facility] [place], [place] A, FNP   4 months ago Diarrhea due to drug   [facility] Merita Norton T, FNP   7 months ago Diabetes mellitus type 2 with neurological manifestations [facility])   [place] [facility] Jacky Kindle, FNP       Future Appointments             In 2 months Pardue, Monico Blitz, DO [place] [facility], [facility]             Fluticasone-Umeclidin-Vilant (TRELEGY ELLIPTA) 100-62.5-25 MCG/ACT AEPB 1 each 11    Sig: Inhale 1 puff into the lungs daily.     Off-Protocol Failed - 08/31/2023  3:43 PM      Failed - Medication not assigned to a protocol, review manually.      Passed - Valid encounter within last 12 months    Recent Outpatient Visits           2 weeks ago Dysuria   [place] [facility] [place], [place], PA-C   4 weeks ago Hypertension associated with type 2 diabetes mellitus [facility])   [place] [facility] Jacky Kindle, FNP   1 month ago Great toe pain, left   [place] [facility] [place], [place]  A, FNP   4 months ago Diarrhea due to drug   [facility] Merita Norton T, FNP   7 months ago Diabetes mellitus type 2 with neurological manifestations [facility])   [place] [facility] Jacky Kindle, FNP       Future Appointments             In 2 months Pardue, Monico Blitz, DO [place] [facility]

## 2023-09-18 ENCOUNTER — Telehealth: Payer: Self-pay | Admitting: Family Medicine

## 2023-09-18 NOTE — Telephone Encounter (Signed)
[facility] is requesting refill pantoprazole (PROTONIX) 20 MG tablet  Please advise

## 2023-09-20 MED ORDER — PANTOPRAZOLE SODIUM 20 MG PO TBEC: DELAYED_RELEASE_TABLET | ORAL | 0 refills | Status: DC

## 2023-09-27 ENCOUNTER — Other Ambulatory Visit: Payer: Self-pay | Admitting: Family Medicine

## 2023-09-27 DIAGNOSIS — Z7951 Long term (current) use of inhaled steroids: Secondary | ICD-10-CM

## 2023-09-27 DIAGNOSIS — B370 Candidal stomatitis: Secondary | ICD-10-CM

## 2023-09-27 DIAGNOSIS — F411 Generalized anxiety disorder: Secondary | ICD-10-CM

## 2023-09-27 DIAGNOSIS — F331 Major depressive disorder, recurrent, moderate: Secondary | ICD-10-CM

## 2023-09-27 NOTE — Telephone Encounter (Signed)
 Pantoprazole sent on 09/20/23 for 90 days supply. Too soon to refill.   Bupropion refill appropriate and sent.

## 2023-09-27 NOTE — Telephone Encounter (Signed)
 [facility] faxed refill request for the following medications:   nystatin (MYCOSTATIN) 100000 UNIT/ML suspension     pantoprazole (PROTONIX) 20 MG tablet    buPROPion (WELLBUTRIN XL) 300 MG 24 hr tablet    Please advise.

## 2023-09-28 MED ORDER — BUPROPION HCL ER (XL) 300 MG PO TB24: 300.0000 mg | ORAL_TABLET | Freq: Every day | ORAL | 3 refills | Status: DC

## 2023-10-10 LAB — HM DIABETES EYE EXAM

## 2023-10-18 ENCOUNTER — Encounter: Payer: Self-pay | Admitting: Ophthalmology

## 2023-10-23 ENCOUNTER — Telehealth: Payer: Self-pay

## 2023-10-23 NOTE — Telephone Encounter (Signed)
 Patient was identified as falling into the [place] Measure - Diabetes.   Patient was: Appointment scheduled with primary care provider in the next 30 days.

## 2023-10-31 ENCOUNTER — Other Ambulatory Visit: Payer: Self-pay | Admitting: Family Medicine

## 2023-10-31 ENCOUNTER — Telehealth: Payer: Self-pay | Admitting: Family Medicine

## 2023-10-31 DIAGNOSIS — M2550 Pain in unspecified joint: Secondary | ICD-10-CM

## 2023-10-31 MED ORDER — MELOXICAM 15 MG PO TABS
15.0000 mg | ORAL_TABLET | Freq: Every day | ORAL | 0 refills | Status: DC
Start: 2023-10-31 — End: 2024-02-01

## 2023-10-31 NOTE — Telephone Encounter (Signed)
 Copied from CRM [id number]. Topic: Clinical - Medication Refill >> Oct 31, 2023 10:03 AM Elle L wrote: Most Recent Primary Care Visit:  Provider: Debera Lat  Department: [facility]  Visit Type: OFFICE VISIT  Date: 08/16/2023  Medication: albuterol (VENTOLIN HFA) 108 (90 Base) MCG/ACT inhaler  Has the patient contacted their pharmacy? Yes  Is this the correct pharmacy for this prescription? Yes  This is the patient's preferred pharmacy:  [id number] [place] - [address] AT [facility] [address] [place] [address] Phone: [phone] Fax: [phone]   Has the prescription been filled recently? No  Is the patient out of the medication? Yes  Has the patient been seen for an appointment in the last year OR does the patient have an upcoming appointment? Yes  Can we respond through MyChart? Yes  Agent: Please be advised that Rx refills may take up to 3 business days. We ask that you follow-up with your pharmacy.

## 2023-10-31 NOTE — Anesthesia Preprocedure Evaluation (Addendum)
 Anesthesia Evaluation    Airway Mallampati: III  TM Distance: >3 FB Neck ROM: Full    Dental no notable dental hx. (+) Upper Dentures, Partial Lower, Missing   Pulmonary Current Smoker   Pulmonary exam normal breath sounds clear to auscultation       Cardiovascular hypertension, Normal cardiovascular exam Rhythm:Regular Rate:Normal     Neuro/Psych    GI/Hepatic   Endo/Other  diabetes    Renal/GU      Musculoskeletal negative musculoskeletal ROS (+) Arthritis ,    Abdominal   Peds negative pediatric ROS (+)  Hematology   Anesthesia Other Findings Anxiety Depression Fatigue Headache Thyroid disease Hypertension Diabetes mellitus without complication (HCC) COPD (chronic obstructive pulmonary disease) (HCC) Dysrhythmia GERD (gastroesophageal reflux disease) Hypothyroidism Arthritis Cancer (HCC) Tremors of nervous system Heart murmur Pneumonia Dyspnea Anemia Disordered sleep Essential tremor Generalized anxiety disorder Chronic bronchitis (HCC) Aortic atherosclerosis (HCC) Smoker    Reproductive/Obstetrics                              Anesthesia Physical Anesthesia Plan  ASA: 3  Anesthesia Plan: MAC   Post-op Pain Management:    Induction: Intravenous  PONV Risk Score and Plan:   Airway Management Planned: Natural Airway and Nasal Cannula  Additional Equipment:   Intra-op Plan:   Post-operative Plan:   Informed Consent: I have reviewed the patients History and Physical, chart, labs and discussed the procedure including the risks, benefits and alternatives for the proposed anesthesia with the patient or authorized representative who has indicated his/her understanding and acceptance.     Dental Advisory Given  Plan Discussed with: Anesthesiologist, CRNA and Surgeon  Anesthesia Plan Comments: (Patient consented for risks of anesthesia including but not limited to:  -  adverse reactions to medications - damage to eyes, teeth, lips or other oral mucosa - nerve damage due to positioning  - sore throat or hoarseness - Damage to heart, brain, nerves, lungs, other parts of body or loss of life  Patient voiced understanding and assent.)        Anesthesia Quick Evaluation

## 2023-10-31 NOTE — Discharge Instructions (Signed)

## 2023-10-31 NOTE — Telephone Encounter (Signed)
 [facility] refill request for the following medications:  meloxicam (MOBIC) 15 MG tablet   Please advise.

## 2023-11-01 ENCOUNTER — Ambulatory Visit
Admission: RE | Admit: 2023-11-01 | Discharge: 2023-11-01 | Disposition: A | Discharge status: Home / Self Care | Attending: Ophthalmology | Admitting: Ophthalmology

## 2023-11-01 ENCOUNTER — Ambulatory Visit: Payer: Self-pay | Admitting: Anesthesiology

## 2023-11-01 ENCOUNTER — Encounter: Payer: Self-pay | Admitting: Ophthalmology

## 2023-11-01 ENCOUNTER — Other Ambulatory Visit: Payer: Self-pay

## 2023-11-01 ENCOUNTER — Ambulatory Visit: Payer: Self-pay | Admitting: Family Medicine

## 2023-11-01 ENCOUNTER — Encounter
Admission: RE | Disposition: A | Discharge status: Home / Self Care | Payer: Self-pay | Source: Home / Self Care | Attending: Ophthalmology

## 2023-11-01 DIAGNOSIS — H2511 Age-related nuclear cataract, right eye: Secondary | ICD-10-CM | POA: Insufficient documentation

## 2023-11-01 DIAGNOSIS — Z791 Long term (current) use of non-steroidal anti-inflammatories (NSAID): Secondary | ICD-10-CM | POA: Insufficient documentation

## 2023-11-01 DIAGNOSIS — I10 Essential (primary) hypertension: Secondary | ICD-10-CM | POA: Diagnosis not present

## 2023-11-01 DIAGNOSIS — E1136 Type 2 diabetes mellitus with diabetic cataract: Secondary | ICD-10-CM | POA: Diagnosis not present

## 2023-11-01 DIAGNOSIS — M1990 Unspecified osteoarthritis, unspecified site: Secondary | ICD-10-CM | POA: Diagnosis not present

## 2023-11-01 DIAGNOSIS — F419 Anxiety disorder, unspecified: Secondary | ICD-10-CM | POA: Insufficient documentation

## 2023-11-01 DIAGNOSIS — Z7982 Long term (current) use of aspirin: Secondary | ICD-10-CM | POA: Diagnosis not present

## 2023-11-01 DIAGNOSIS — F17210 Nicotine dependence, cigarettes, uncomplicated: Secondary | ICD-10-CM | POA: Diagnosis not present

## 2023-11-01 DIAGNOSIS — F32A Depression, unspecified: Secondary | ICD-10-CM | POA: Insufficient documentation

## 2023-11-01 DIAGNOSIS — E039 Hypothyroidism, unspecified: Secondary | ICD-10-CM | POA: Insufficient documentation

## 2023-11-01 DIAGNOSIS — Z7984 Long term (current) use of oral hypoglycemic drugs: Secondary | ICD-10-CM | POA: Diagnosis not present

## 2023-11-01 DIAGNOSIS — Z79890 Hormone replacement therapy (postmenopausal): Secondary | ICD-10-CM | POA: Insufficient documentation

## 2023-11-01 DIAGNOSIS — J449 Chronic obstructive pulmonary disease, unspecified: Secondary | ICD-10-CM | POA: Diagnosis not present

## 2023-11-01 DIAGNOSIS — Z79899 Other long term (current) drug therapy: Secondary | ICD-10-CM | POA: Insufficient documentation

## 2023-11-01 HISTORY — DX: Unspecified chronic bronchitis: J42

## 2023-11-01 HISTORY — PX: CATARACT EXTRACTION W/PHACO: SHX586

## 2023-11-01 HISTORY — DX: Generalized anxiety disorder: F41.1

## 2023-11-01 HISTORY — DX: Atherosclerosis of aorta: I70.0

## 2023-11-01 HISTORY — DX: Nicotine dependence, unspecified, uncomplicated: F17.200

## 2023-11-01 HISTORY — DX: Essential tremor: G25.0

## 2023-11-01 LAB — GLUCOSE, CAPILLARY: Glucose-Capillary: 202 mg/dL — ABNORMAL HIGH (ref 70–99)

## 2023-11-01 SURGERY — PHACOEMULSIFICATION, CATARACT, WITH IOL INSERTION
Anesthesia: Monitor Anesthesia Care | Site: Eye | Laterality: Right

## 2023-11-01 MED ORDER — MIDAZOLAM HCL 2 MG/2ML IJ SOLN
INTRAMUSCULAR | Status: DC | PRN
Start: 2023-11-01 — End: 2023-11-01
  Administered 2023-11-01 (×2): 1 mg via INTRAVENOUS

## 2023-11-01 MED ORDER — SIGHTPATH DOSE#1 NA HYALUR & NA CHOND-NA HYALUR IO KIT
PACK | INTRAOCULAR | Status: DC | PRN
  Administered 2023-11-01: 1 via OPHTHALMIC

## 2023-11-01 MED ORDER — CEFUROXIME OPHTHALMIC INJECTION 1 MG/0.1 ML
INJECTION | OPHTHALMIC | Status: DC | PRN
  Administered 2023-11-01: 1 mg via INTRACAMERAL

## 2023-11-01 MED ORDER — TETRACAINE HCL 0.5 % OP SOLN
1.0000 [drp] | OPHTHALMIC | Status: DC | PRN
Start: 2023-11-01 — End: 2023-11-01
  Administered 2023-11-01 (×3): 1 [drp] via OPHTHALMIC

## 2023-11-01 MED ORDER — BRIMONIDINE TARTRATE-TIMOLOL 0.2-0.5 % OP SOLN
OPHTHALMIC | Status: DC | PRN
  Administered 2023-11-01: 1 [drp] via OPHTHALMIC

## 2023-11-01 MED ORDER — LIDOCAINE HCL (PF) 2 % IJ SOLN
INTRAOCULAR | Status: DC | PRN
  Administered 2023-11-01: 2 mL

## 2023-11-01 MED ORDER — ARMC OPHTHALMIC DILATING DROPS
OPHTHALMIC | Status: AC
  Filled 2023-11-01: qty 0.5

## 2023-11-01 MED ORDER — ARMC OPHTHALMIC DILATING DROPS
1.0000 | OPHTHALMIC | Status: DC | PRN
Start: 2023-11-01 — End: 2023-11-01
  Administered 2023-11-01 (×3): 1 via OPHTHALMIC

## 2023-11-01 MED ORDER — SIGHTPATH DOSE#1 BSS IO SOLN
INTRAOCULAR | Status: DC | PRN
Start: 2023-11-01 — End: 2023-11-01
  Administered 2023-11-01: 15 mL via INTRAOCULAR

## 2023-11-01 MED ORDER — TETRACAINE HCL 0.5 % OP SOLN
OPHTHALMIC | Status: AC
Start: 2023-11-01 — End: ?
  Filled 2023-11-01: qty 4

## 2023-11-01 MED ORDER — FENTANYL CITRATE (PF) 100 MCG/2ML IJ SOLN
INTRAMUSCULAR | Status: DC | PRN
  Administered 2023-11-01: 25 ug via INTRAVENOUS
  Administered 2023-11-01: 50 ug via INTRAVENOUS

## 2023-11-01 MED ORDER — SODIUM CHLORIDE 0.9% FLUSH
INTRAVENOUS | Status: DC | PRN
  Administered 2023-11-01: 10 mL via INTRAVENOUS

## 2023-11-01 MED ORDER — FENTANYL CITRATE (PF) 100 MCG/2ML IJ SOLN
INTRAMUSCULAR | Status: AC
Start: 2023-11-01 — End: ?
  Filled 2023-11-01: qty 2

## 2023-11-01 MED ORDER — SIGHTPATH DOSE#1 BSS IO SOLN
INTRAOCULAR | Status: DC | PRN
  Administered 2023-11-01: 61 mL via OPHTHALMIC

## 2023-11-01 MED ORDER — MIDAZOLAM HCL 2 MG/2ML IJ SOLN
INTRAMUSCULAR | Status: AC
  Filled 2023-11-01: qty 2

## 2023-11-01 SURGICAL SUPPLY — 10 items
CATARACT SUITE SIGHTPATH (MISCELLANEOUS) ×1 IMPLANT
FEE CATARACT SUITE SIGHTPATH (MISCELLANEOUS) ×1 IMPLANT
GLOVE BIOGEL PI IND STRL 8 (GLOVE) ×1 IMPLANT
GLOVE SURG LX STRL 7.5 STRW (GLOVE) ×1 IMPLANT
GLOVE SURG PROTEXIS BL SZ6.5 (GLOVE) ×1 IMPLANT
GLOVE SURG SYN 6.5 PF PI BL (GLOVE) ×1 IMPLANT
LENS IOL TECNIS EYHANCE 17.5 (Intraocular Lens) IMPLANT
NDL FILTER BLUNT 18X1 1/2 (NEEDLE) ×1 IMPLANT
NEEDLE FILTER BLUNT 18X1 1/2 (NEEDLE) ×1 IMPLANT
SYR 3ML LL SCALE MARK (SYRINGE) ×1 IMPLANT

## 2023-11-01 NOTE — Op Note (Signed)
 LOCATION:  [facility]   PREOPERATIVE DIAGNOSIS:    Nuclear sclerotic cataract right eye. H25.11   POSTOPERATIVE DIAGNOSIS:  Nuclear sclerotic cataract right eye.     PROCEDURE:  Phacoemusification with posterior chamber intraocular lens placement of the right eye   ULTRASOUND TIME: Procedure(s): PHACOEMULSIFICATION, CATARACT, WITH IOL INSERTION 9.83 00:47.4 (Right)  LENS:   Implant Name Type Inv. Item Serial No. Manufacturer Lot No. LRB No. Used Action  LENS IOL TECNIS EYHANCE 17.5 - [id number] Intraocular Lens LENS IOL TECNIS EYHANCE 17.5 [id number] [employer]  Right 1 Implanted         SURGEON:  Deirdre Evener, MD   ANESTHESIA:  Topical with tetracaine drops and 2% Xylocaine jelly, augmented with 1% preservative-free intracameral lidocaine.    COMPLICATIONS:  None.   DESCRIPTION OF PROCEDURE:  The patient was identified in the holding room and transported to the operating room and placed in the supine position under the operating microscope.  The right eye was identified as the operative eye and it was prepped and draped in the usual sterile ophthalmic fashion.   A 1 millimeter clear-corneal paracentesis was made at the 12:00 position.  0.5 ml of preservative-free 1% lidocaine was injected into the anterior chamber. The anterior chamber was filled with Viscoat viscoelastic.  A 2.4 millimeter keratome was used to make a near-clear corneal incision at the 9:00 position.  A curvilinear capsulorrhexis was made with a cystotome and capsulorrhexis forceps.  Balanced salt solution was used to hydrodissect and hydrodelineate the nucleus.   Phacoemulsification was then used in stop and chop fashion to remove the lens nucleus and epinucleus.  The remaining cortex was then removed using the irrigation and aspiration handpiece. Provisc was then placed into the capsular bag to distend it for lens placement.  A lens was then injected into the capsular bag.  The remaining  viscoelastic was aspirated.   Wounds were hydrated with balanced salt solution.  The anterior chamber was inflated to a physiologic pressure with balanced salt solution.  No wound leaks were noted. Cefuroxime 0.1 ml of a 10mg /ml solution was injected into the anterior chamber for a dose of 1 mg of intracameral antibiotic at the completion of the case.   Timolol and Brimonidine drops were applied to the eye.  The patient was taken to the recovery room in stable condition without complications of anesthesia or surgery.   Silvie Obremski 11/01/2023, 9:00 AM

## 2023-11-01 NOTE — Transfer of Care (Signed)
 Immediate Anesthesia Transfer of Care Note  Patient: Linda Bradford  Procedure(s) Performed: PHACOEMULSIFICATION, CATARACT, WITH IOL INSERTION 9.83 00:47.4 (Right: Eye)  Patient Location: PACU  Anesthesia Type:MAC  Level of Consciousness: awake  Airway & Oxygen Therapy: Patient Spontanous Breathing  Post-op Assessment: Report given to RN  Post vital signs: Reviewed  Last Vitals: Pt awake and instructed to take deep breathes.  Smoker.  See PACU flow sheet fr normal temp . BP  Vitals Value Taken Time  BP    Temp    Pulse 63 11/01/23 0904  Resp 11 11/01/23 0904  SpO2 92 % 11/01/23 0904  Vitals shown include unfiled device data.  Last Pain:  Vitals:   11/01/23 0901  TempSrc:   PainSc: (P) 0-No pain         Complications: No notable events documented.

## 2023-11-01 NOTE — Anesthesia Postprocedure Evaluation (Signed)
 Anesthesia Post Note  Patient: Linda Bradford  Procedure(s) Performed: PHACOEMULSIFICATION, CATARACT, WITH IOL INSERTION 9.83 00:47.4 (Right: Eye)  Patient location during evaluation: PACU Anesthesia Type: MAC Level of consciousness: awake and alert Pain management: pain level controlled Vital Signs Assessment: post-procedure vital signs reviewed and stable Respiratory status: spontaneous breathing, nonlabored ventilation, respiratory function stable and patient connected to nasal cannula oxygen Cardiovascular status: blood pressure returned to baseline and stable Postop Assessment: no apparent nausea or vomiting Anesthetic complications: no   No notable events documented.   Last Vitals:  Vitals:   11/01/23 0901 11/01/23 0906  BP: (!) 139/55 127/64  Pulse: 64 64  Resp:  13  Temp: (!) 36.1 C   SpO2: 93% 91%    Last Pain:  Vitals:   11/01/23 0906  TempSrc:   PainSc: 0-No pain                 Marisue Humble

## 2023-11-01 NOTE — H&P (Signed)
 [facility]   Primary Care Physician:  Sallee Provencal, FNP Ophthalmologist: Dr. Lockie Mola  Pre-Procedure History & Physical: HPI:  Linda Bradford is a 68 y.o. female here for ophthalmic surgery.   Past Medical History:  Diagnosis Date   Anemia    Anxiety    Aortic atherosclerosis (HCC)    Arthritis    Cancer (HCC)    skin cancer, removed   Chronic bronchitis (HCC)    COPD (chronic obstructive pulmonary disease) (HCC)    Depression    Diabetes mellitus without complication (HCC)    Disordered sleep 01/13/2014   Dyspnea    Dysrhythmia    occasional pvc's. not being treated.   Essential tremor    Fatigue    Generalized anxiety disorder    GERD (gastroesophageal reflux disease)    Headache    Heart murmur    Hypertension    Hypothyroidism    Pneumonia    Smoker    Thyroid disease    Tremors of nervous system     Past Surgical History:  Procedure Laterality Date   APPENDECTOMY     CHOLECYSTECTOMY N/A 07/01/2019   Procedure: LAPAROSCOPIC CHOLECYSTECTOMY;  Surgeon: Henrene Dodge, MD;  Location: [facility] ORS;  Service: General;  Laterality: N/A;   COLECTOMY WITH COLOSTOMY CREATION/HARTMANN PROCEDURE N/A 09/03/2018   Error, this was not done.   COLONOSCOPY WITH PROPOFOL N/A 03/08/2019   Procedure: COLONOSCOPY WITH PROPOFOL;  Surgeon: Pasty Spillers, MD;  Location: [facility] ENDOSCOPY;  Service: Endoscopy;  Laterality: N/A;   COLONOSCOPY WITH PROPOFOL N/A 07/05/2023   Procedure: COLONOSCOPY WITH PROPOFOL;  Surgeon: Toney Reil, MD;  Location: [facility] ENDOSCOPY;  Service: Gastroenterology;  Laterality: N/A;   CYSTOSCOPY WITH STENT PLACEMENT Bilateral 09/03/2018   Procedure: CYSTOSCOPY WITH STENT PLACEMENT-LIGHTED STENTS;  Surgeon: Vanna Scotland, MD;  Location: [facility] ORS;  Service: Urology;  Laterality: Bilateral;   LAPAROSCOPIC SIGMOID COLECTOMY N/A 09/03/2018   Procedure: LAPAROSCOPIC SIGMOID COLECTOMY;  Surgeon: Henrene Dodge, MD;  Location: [facility] ORS;   Service: General;  Laterality: N/A;   percutaneous drainage tube  07/2018   2nd tube placed. not healing in colon, causing a fistula   POLYPECTOMY  07/05/2023   Procedure: POLYPECTOMY;  Surgeon: Toney Reil, MD;  Location: [facility] ENDOSCOPY;  Service: Gastroenterology;;   TONSILLECTOMY     TOTAL KNEE ARTHROPLASTY Left 05/10/2021   Procedure: TOTAL KNEE ARTHROPLASTY;  Surgeon: Lyndle Herrlich, MD;  Location: [facility] ORS;  Service: Orthopedics;  Laterality: Left;   TOTAL KNEE ARTHROPLASTY Right 01/10/2022   Procedure: TOTAL KNEE ARTHROPLASTY;  Surgeon: Lyndle Herrlich, MD;  Location: [facility] ORS;  Service: Orthopedics;  Laterality: Right;   TUBAL LIGATION      Prior to Admission medications   Medication Sig Start Date End Date Taking? Authorizing Provider  acetaminophen (TYLENOL) 500 MG tablet Take 1,000 mg by mouth every 6 (six) hours as needed for mild pain or moderate pain.    Yes [provider]  albuterol (VENTOLIN HFA) 108 (90 Base) MCG/ACT inhaler Inhale into the lungs. 02/22/23  Yes [provider]  amLODipine (NORVASC) 5 MG tablet TAKE 1 TABLET(5 MG) BY MOUTH DAILY 07/04/23  Yes Merita Norton T, FNP  buPROPion (WELLBUTRIN XL) 300 MG 24 hr tablet Take 1 tablet (300 mg total) by mouth daily. 09/28/23  Yes Charlcie Cradle A, FNP  Cholecalciferol (VITAMIN D) 50 MCG (2000 UT) tablet Take 2,000 Units by mouth daily.   Yes [provider]  diclofenac Sodium (VOLTAREN) 1 %  GEL    Yes [provider]  DULoxetine (CYMBALTA) 60 MG capsule Take 1 capsule (60 mg total) by mouth 2 (two) times daily. 07/03/23  Yes Clifton, Caryl Asp A, FNP  fluticasone (FLONASE) 50 MCG/ACT nasal spray USE 2 SPRAYS IN BOTH NOSTRILS DAILY. 06/14/23  Yes Merita Norton T, FNP  Fluticasone-Umeclidin-Vilant (TRELEGY ELLIPTA) 100-62.5-25 MCG/ACT AEPB Inhale 1 puff into the lungs daily. 08/31/23  Yes Charlcie Cradle A, FNP  gabapentin (NEURONTIN) 300 MG capsule Take 1 capsule (300 mg total) by mouth 2  (two) times daily. Take 2 capsules PO every morning; Take 3 capsules PO every evening. 11/03/22  Yes Jacky Kindle, FNP  levothyroxine (SYNTHROID) 75 MCG tablet Take 1 tablet (75 mcg total) by mouth daily before breakfast. 08/29/23  Yes Sallee Provencal, FNP  meloxicam (MOBIC) 15 MG tablet Take 1 tablet (15 mg total) by mouth daily. 10/31/23  Yes Sallee Provencal, FNP  metformin (FORTAMET) 1000 MG (OSM) 24 hr tablet Take 1,000 mg by mouth 2 (two) times daily with a meal.   Yes [provider]  metoCLOPramide (REGLAN) 10 MG tablet TAKE 1 TABLET(10 MG) BY MOUTH EVERY 8 HOURS AS NEEDED FOR NAUSEA 01/31/23  Yes Jacky Kindle, FNP  metoprolol succinate (TOPROL-XL) 50 MG 24 hr tablet Take 1 tablet (50 mg total) by mouth daily. 01/18/23  Yes Jacky Kindle, FNP  montelukast (SINGULAIR) 10 MG tablet Take 1 tablet (10 mg total) by mouth at bedtime. 08/03/23  Yes Jacky Kindle, FNP  oxybutynin (DITROPAN-XL) 5 MG 24 hr tablet Take 1 tablet (5 mg total) by mouth at bedtime. 08/18/22  Yes Dove, Myra C, MD  pantoprazole (PROTONIX) 20 MG tablet TAKE 1 TABLET(20 MG) BY MOUTH TWICE DAILY BEFORE A MEAL 09/20/23  Yes Charlcie Cradle A, FNP  primidone (MYSOLINE) 50 MG tablet Take 0.5 tablets (25 mg total) by mouth 2 (two) times daily. 07/01/22  Yes Marrion Coy, MD  rosuvastatin (CRESTOR) 10 MG tablet TAKE 1 TABLET (10 MG TOTAL) BY MOUTH DAILY. 06/07/23  Yes Jacky Kindle, FNP  traMADol (ULTRAM) 50 MG tablet Take 50 mg by mouth 2 (two) times daily as needed for moderate pain. 09/29/22  Yes [provider]  aspirin 81 MG chewable tablet Chew 1 tablet (81 mg total) by mouth 2 (two) times daily. Patient not taking: Reported on 10/18/2023 01/13/22   Altamese Cabal, PA-C  blood glucose meter kit and supplies Dispense based on patient and insurance preference. Once daily. (FOR ICD-10 E10.9, E11.9). 07/12/22   Jacky Kindle, FNP  ezetimibe (ZETIA) 10 MG tablet Take 1 tablet (10 mg total) by mouth daily. 06/13/23 09/11/23   Antonieta Iba, MD  fluconazole (DIFLUCAN) 150 MG tablet Take 1 tablet (150 mg total) by mouth every three (3) days as needed for up to 2 doses. 08/16/23   Debera Lat, PA-C  glucose blood (CONTOUR NEXT TEST) test strip To check blood sugar once daily 06/09/17   Margaretann Loveless, PA-C  ipratropium-albuterol (DUONEB) 0.5-2.5 (3) MG/3ML SOLN Take 3 mLs by nebulization every 6 (six) hours as needed. 08/29/22   Jacky Kindle, FNP  loperamide (IMODIUM A-D) 2 MG tablet Take 2-4 mg by mouth 4 (four) times daily as needed for diarrhea or loose stools.    [provider]  nystatin (MYCOSTATIN) 100000 UNIT/ML suspension Take 5 mLs (500,000 Units total) by mouth 4 (four) times daily. Patient not taking: Reported on 10/18/2023 10/20/22   Jacky Kindle, FNP  nystatin (  MYCOSTATIN/NYSTOP) powder Apply 1 application topically 3 (three) times daily. 10/01/21   Jacky Kindle, FNP    Allergies as of 10/18/2023 - Review Complete 10/18/2023  Allergen Reaction Noted   Dexilant [dexlansoprazole] Nausea Only and Other (See Comments) 07/28/2016    Family History  Problem Relation Age of Onset   Lung cancer Mother    Alcohol abuse Sister    Arthritis Sister    Alcohol abuse Brother    Heart disease Brother 31   Heart attack Brother    Pancreatic cancer Maternal Aunt    Diabetes Maternal Aunt    Alcohol abuse Son    Drug abuse Son    Drug abuse Son    Heart disease Father 49   Diabetes Sister    Breast cancer Brother    Alcohol abuse Maternal Uncle    Alcohol abuse Maternal Grandmother    Diabetes Maternal Grandmother     Social History   Socioeconomic History   Marital status: Widowed    Spouse name: Rosanne Ashing   Number of children: 2   Years of education: Not on file   Highest education level: Not on file  Occupational History   Occupation: Energy manager    Comment: currently employed  Tobacco Use   Smoking status: Every Day    Current packs/day: 1.50    Average packs/day: 2.0  packs/day for 45.5 years (88.8 ttl pk-yrs)    Types: Cigarettes    Start date: 03/02/1980   Smokeless tobacco: Never   Tobacco comments:    1.5PPD 05/16/2022  Vaping Use   Vaping status: Former   Devices: only for a month. did not like  Substance and Sexual Activity   Alcohol use: No    Alcohol/week: 0.0 standard drinks of alcohol   Drug use: No   Sexual activity: Not on file  Other Topics Concern   Not on file  Social History Narrative   Live with hubby at home, sister susie with down syndrome   Social Drivers of Health   Financial Resource Strain: Low Risk  (05/01/2023)   Overall Financial Resource Strain (CARDIA)    Difficulty of Paying Living Expenses: Not hard at all  Food Insecurity: No Food Insecurity (05/01/2023)   Hunger Vital Sign    Worried About Running Out of Food in the Last Year: Never true    Ran Out of Food in the Last Year: Never true  Transportation Needs: No Transportation Needs (05/01/2023)   PRAPARE - Administrator, Civil Service (Medical): No    Lack of Transportation (Non-Medical): No  Physical Activity: Inactive (05/01/2023)   Exercise Vital Sign    Days of Exercise per Week: 0 days    Minutes of Exercise per Session: 0 min  Stress: Stress Concern Present (05/01/2023)   [employer] of Occupational Health - Occupational Stress Questionnaire    Feeling of Stress : To some extent  Social Connections: Moderately Integrated (05/01/2023)   Social Connection and Isolation Panel [NHANES]    Frequency of Communication with Friends and Family: Once a week    Frequency of Social Gatherings with Friends and Family: Never    Attends Religious Services: More than 4 times per year    Active Member of [employer] or Organizations: Yes    Attends Banker Meetings: 1 to 4 times per year    Marital Status: Married  Catering manager Violence: Not At Risk (05/01/2023)   Humiliation, Afraid, Rape, and Kick questionnaire  Fear of Current or  Ex-Partner: No    Emotionally Abused: No    Physically Abused: No    Sexually Abused: No    Review of Systems: See HPI, otherwise negative ROS  Physical Exam: BP (!) 147/60   Pulse 93   Temp 98.2 F (36.8 C) (Temporal)   Resp 18   Ht 5\' 11"  (1.803 m)   Wt 102.1 kg   SpO2 94%   BMI 31.38 kg/m  General:   Alert,  pleasant and cooperative in NAD Head:  Normocephalic and atraumatic. Lungs:  Clear to auscultation.    Heart:  Regular rate and rhythm.   Impression/Plan: Linda Bradford is here for ophthalmic surgery.  Risks, benefits, limitations, and alternatives regarding ophthalmic surgery have been reviewed with the patient.  Questions have been answered.  All parties agreeable.   Lockie Mola, MD  11/01/2023, 8:25 AM

## 2023-11-02 ENCOUNTER — Encounter: Payer: Self-pay | Admitting: Ophthalmology

## 2023-11-02 NOTE — Telephone Encounter (Signed)
 Requested medication (s) are due for refill today: routing for review  Requested medication (s) are on the active medication list: yes  Last refill:  06/05/23  Future visit scheduled: yes  Notes to clinic:  Unable to refill per protocol, last refill by another/historical provider.      Requested Prescriptions  Pending Prescriptions Disp Refills   albuterol (VENTOLIN HFA) 108 (90 Base) MCG/ACT inhaler      Sig: Inhale into the lungs.     Pulmonology:  Beta Agonists 2 Failed - 11/02/2023  9:57 AM      Failed - Valid encounter within last 12 months    Recent Outpatient Visits   None            Passed - Last BP in normal range    BP Readings from Last 1 Encounters:  11/01/23 127/64         Passed - Last Heart Rate in normal range    Pulse Readings from Last 1 Encounters:  11/01/23 64

## 2023-11-07 MED ORDER — ALBUTEROL SULFATE HFA 108 (90 BASE) MCG/ACT IN AERS: 2.0000 | INHALATION_SPRAY | Freq: Four times a day (QID) | RESPIRATORY_TRACT | 1 refills | Status: DC | PRN

## 2023-11-08 ENCOUNTER — Other Ambulatory Visit: Payer: Self-pay

## 2023-11-08 ENCOUNTER — Telehealth: Payer: Self-pay | Admitting: Family Medicine

## 2023-11-08 DIAGNOSIS — I10 Essential (primary) hypertension: Secondary | ICD-10-CM

## 2023-11-08 DIAGNOSIS — G250 Essential tremor: Secondary | ICD-10-CM

## 2023-11-08 MED ORDER — METOPROLOL SUCCINATE ER 50 MG PO TB24: 50.0000 mg | ORAL_TABLET | Freq: Every day | ORAL | 3 refills | Status: DC

## 2023-11-08 NOTE — Telephone Encounter (Signed)
 [facility] faxed refill request for the following medications:   metoprolol succinate (TOPROL-XL) 50 MG 24 hr tablet     Please advise.

## 2023-11-13 NOTE — Anesthesia Preprocedure Evaluation (Signed)
 Anesthesia Evaluation  Patient identified by MRN, date of birth, ID band Patient awake    Reviewed: Allergy & Precautions, H&P , NPO status , Patient's Chart, lab work & pertinent test results  Airway Mallampati: III  TM Distance: >3 FB Neck ROM: Full    Dental no notable dental hx. (+) Upper Dentures, Partial Upper, Missing Upper Dentures, Partial Lower, Missing some teeth   :   Pulmonary shortness of breath, pneumonia, COPD, Current Smoker and Patient abstained from smoking.   Pulmonary exam normal breath sounds clear to auscultation       Cardiovascular hypertension, Normal cardiovascular exam+ dysrhythmias + Valvular Problems/Murmurs  Rhythm:Regular Rate:Normal     Neuro/Psych  Headaches PSYCHIATRIC DISORDERS Anxiety Depression       GI/Hepatic Neg liver ROS,GERD  ,,  Endo/Other  diabetesHypothyroidism    Renal/GU negative Renal ROS  negative genitourinary   Musculoskeletal  (+) Arthritis ,    Abdominal   Peds negative pediatric ROS (+)  Hematology  (+) Blood dyscrasia, anemia   Anesthesia Other Findings Previous cataract surgery 11-01-23 Dr. Aldo Amble  Takes metoprolol  Anxiety  Depression Fatigue  Headache Thyroid disease  Hypertension Diabetes mellitus without complication (HCC) COPD (chronic obstructive pulmonary disease) (HCC) Dysrhythmia  GERD (gastroesophageal reflux disease) Hypothyroidism A rthritis Cancer (HCC)  Tremors of nervous system Heart murmur  Pneumonia Dyspnea  Anemia Disordered sleep  Essential tremor Generalized anxiety disorder  Chronic bronchitis (HCC) Aortic atherosclerosis (HCC)  Smoker    Reproductive/Obstetrics negative OB ROS                              Anesthesia Physical Anesthesia Plan  ASA: 3  Anesthesia Plan: MAC   Post-op Pain Management:    Induction: Intravenous  PONV Risk Score and Plan:   Airway Management Planned:  Natural Airway and Nasal Cannula  Additional Equipment:   Intra-op Plan:   Post-operative Plan:   Informed Consent: I have reviewed the patients History and Physical, chart, labs and discussed the procedure including the risks, benefits and alternatives for the proposed anesthesia with the patient or authorized representative who has indicated his/her understanding and acceptance.     Dental Advisory Given  Plan Discussed with: Anesthesiologist, CRNA and Surgeon  Anesthesia Plan Comments: (Patient consented for risks of anesthesia including but not limited to:  - adverse reactions to medications - damage to eyes, teeth, lips or other oral mucosa - nerve damage due to positioning  - sore throat or hoarseness - Damage to heart, brain, nerves, lungs, other parts of body or loss of life  Patient voiced understanding and assent.)         Anesthesia Quick Evaluation

## 2023-11-14 NOTE — Discharge Instructions (Signed)

## 2023-11-15 ENCOUNTER — Other Ambulatory Visit: Payer: Self-pay

## 2023-11-15 ENCOUNTER — Encounter
Admission: RE | Disposition: A | Discharge status: Home / Self Care | Payer: Self-pay | Source: Home / Self Care | Attending: Ophthalmology

## 2023-11-15 ENCOUNTER — Ambulatory Visit
Admission: RE | Admit: 2023-11-15 | Discharge: 2023-11-15 | Disposition: A | Discharge status: Home / Self Care | Attending: Ophthalmology | Admitting: Ophthalmology

## 2023-11-15 ENCOUNTER — Ambulatory Visit: Payer: Self-pay | Admitting: Anesthesiology

## 2023-11-15 ENCOUNTER — Encounter: Payer: Self-pay | Admitting: Ophthalmology

## 2023-11-15 DIAGNOSIS — H2512 Age-related nuclear cataract, left eye: Secondary | ICD-10-CM | POA: Insufficient documentation

## 2023-11-15 DIAGNOSIS — K219 Gastro-esophageal reflux disease without esophagitis: Secondary | ICD-10-CM | POA: Diagnosis not present

## 2023-11-15 DIAGNOSIS — F32A Depression, unspecified: Secondary | ICD-10-CM | POA: Diagnosis not present

## 2023-11-15 DIAGNOSIS — Z7984 Long term (current) use of oral hypoglycemic drugs: Secondary | ICD-10-CM | POA: Insufficient documentation

## 2023-11-15 DIAGNOSIS — J449 Chronic obstructive pulmonary disease, unspecified: Secondary | ICD-10-CM | POA: Insufficient documentation

## 2023-11-15 DIAGNOSIS — Z961 Presence of intraocular lens: Secondary | ICD-10-CM | POA: Diagnosis not present

## 2023-11-15 DIAGNOSIS — I10 Essential (primary) hypertension: Secondary | ICD-10-CM | POA: Diagnosis not present

## 2023-11-15 DIAGNOSIS — M1990 Unspecified osteoarthritis, unspecified site: Secondary | ICD-10-CM | POA: Diagnosis not present

## 2023-11-15 DIAGNOSIS — F17210 Nicotine dependence, cigarettes, uncomplicated: Secondary | ICD-10-CM | POA: Diagnosis not present

## 2023-11-15 DIAGNOSIS — E1136 Type 2 diabetes mellitus with diabetic cataract: Secondary | ICD-10-CM | POA: Insufficient documentation

## 2023-11-15 DIAGNOSIS — Z9841 Cataract extraction status, right eye: Secondary | ICD-10-CM | POA: Insufficient documentation

## 2023-11-15 DIAGNOSIS — E039 Hypothyroidism, unspecified: Secondary | ICD-10-CM | POA: Diagnosis not present

## 2023-11-15 DIAGNOSIS — F419 Anxiety disorder, unspecified: Secondary | ICD-10-CM | POA: Diagnosis not present

## 2023-11-15 HISTORY — PX: CATARACT EXTRACTION W/PHACO: SHX586

## 2023-11-15 LAB — GLUCOSE, CAPILLARY: Glucose-Capillary: 191 mg/dL — ABNORMAL HIGH (ref 70–99)

## 2023-11-15 SURGERY — PHACOEMULSIFICATION, CATARACT, WITH IOL INSERTION
Anesthesia: Monitor Anesthesia Care | Site: Eye | Laterality: Left

## 2023-11-15 MED ORDER — SIGHTPATH DOSE#1 BSS IO SOLN
INTRAOCULAR | Status: DC | PRN
  Administered 2023-11-15: 15 mL via INTRAOCULAR

## 2023-11-15 MED ORDER — BRIMONIDINE TARTRATE-TIMOLOL 0.2-0.5 % OP SOLN
OPHTHALMIC | Status: DC | PRN
Start: 2023-11-15 — End: 2023-11-15
  Administered 2023-11-15: 1 [drp] via OPHTHALMIC

## 2023-11-15 MED ORDER — FENTANYL CITRATE (PF) 100 MCG/2ML IJ SOLN
INTRAMUSCULAR | Status: DC | PRN
  Administered 2023-11-15 (×2): 50 ug via INTRAVENOUS

## 2023-11-15 MED ORDER — SIGHTPATH DOSE#1 NA HYALUR & NA CHOND-NA HYALUR IO KIT
PACK | INTRAOCULAR | Status: DC | PRN
  Administered 2023-11-15: 1 via OPHTHALMIC

## 2023-11-15 MED ORDER — MIDAZOLAM HCL 2 MG/2ML IJ SOLN
INTRAMUSCULAR | Status: DC | PRN
  Administered 2023-11-15: 2 mg via INTRAVENOUS

## 2023-11-15 MED ORDER — ARMC OPHTHALMIC DILATING DROPS
1.0000 | OPHTHALMIC | Status: DC | PRN
  Administered 2023-11-15 (×3): 1 via OPHTHALMIC

## 2023-11-15 MED ORDER — MIDAZOLAM HCL 2 MG/2ML IJ SOLN
INTRAMUSCULAR | Status: AC
  Filled 2023-11-15: qty 2

## 2023-11-15 MED ORDER — LIDOCAINE HCL (PF) 2 % IJ SOLN
INTRAOCULAR | Status: DC | PRN
  Administered 2023-11-15: 2 mL

## 2023-11-15 MED ORDER — ARMC OPHTHALMIC DILATING DROPS
OPHTHALMIC | Status: AC
  Filled 2023-11-15: qty 0.5

## 2023-11-15 MED ORDER — CEFUROXIME OPHTHALMIC INJECTION 1 MG/0.1 ML
INJECTION | OPHTHALMIC | Status: DC | PRN
  Administered 2023-11-15: .1 mL via INTRACAMERAL

## 2023-11-15 MED ORDER — SIGHTPATH DOSE#1 BSS IO SOLN
INTRAOCULAR | Status: DC | PRN
  Administered 2023-11-15: 82 mL via OPHTHALMIC

## 2023-11-15 MED ORDER — TETRACAINE HCL 0.5 % OP SOLN
OPHTHALMIC | Status: AC
Start: 2023-11-15 — End: ?
  Filled 2023-11-15: qty 4

## 2023-11-15 MED ORDER — TETRACAINE HCL 0.5 % OP SOLN
1.0000 [drp] | OPHTHALMIC | Status: DC | PRN
  Administered 2023-11-15 (×3): 1 [drp] via OPHTHALMIC

## 2023-11-15 MED ORDER — FENTANYL CITRATE (PF) 100 MCG/2ML IJ SOLN
INTRAMUSCULAR | Status: AC
  Filled 2023-11-15: qty 2

## 2023-11-15 SURGICAL SUPPLY — 10 items
CATARACT SUITE SIGHTPATH (MISCELLANEOUS) ×1 IMPLANT
FEE CATARACT SUITE SIGHTPATH (MISCELLANEOUS) ×1 IMPLANT
GLOVE BIOGEL PI IND STRL 8 (GLOVE) ×1 IMPLANT
GLOVE SURG LX STRL 7.5 STRW (GLOVE) ×1 IMPLANT
GLOVE SURG PROTEXIS BL SZ6.5 (GLOVE) ×1 IMPLANT
GLOVE SURG SYN 6.5 PF PI BL (GLOVE) ×1 IMPLANT
LENS IOL TECNIS EYHANCE 17.0 (Intraocular Lens) IMPLANT
NDL FILTER BLUNT 18X1 1/2 (NEEDLE) ×1 IMPLANT
NEEDLE FILTER BLUNT 18X1 1/2 (NEEDLE) ×1 IMPLANT
SYR 3ML LL SCALE MARK (SYRINGE) ×1 IMPLANT

## 2023-11-15 NOTE — Op Note (Signed)
 OPERATIVE NOTE  Linda Bradford [id number] 11/15/2023   PREOPERATIVE DIAGNOSIS:  Nuclear sclerotic cataract left eye. H25.12   POSTOPERATIVE DIAGNOSIS:    Nuclear sclerotic cataract left eye.     PROCEDURE:  Phacoemusification with posterior chamber intraocular lens placement of the left eye  Ultrasound time: Procedure(s): PHACOEMULSIFICATION, CATARACT, WITH IOL INSERTION 10.06 01:08.0 (Left)  LENS:   Implant Name Type Inv. Item Serial No. Manufacturer Lot No. LRB No. Used Action  LENS IOL TECNIS EYHANCE 17.0 - [id number] Intraocular Lens LENS IOL TECNIS EYHANCE 17.0 [id number] [employer]  Left 1 Implanted      SURGEON:  Berline Brenner, MD   ANESTHESIA:  Topical with tetracaine drops and 2% Xylocaine jelly, augmented with 1% preservative-free intracameral lidocaine.    COMPLICATIONS:  None.   DESCRIPTION OF PROCEDURE:  The patient was identified in the holding room and transported to the operating room and placed in the supine position under the operating microscope.  The left eye was identified as the operative eye and it was prepped and draped in the usual sterile ophthalmic fashion.   A 1 millimeter clear-corneal paracentesis was made at the 1:30 position.  0.5 ml of preservative-free 1% lidocaine was injected into the anterior chamber.  The anterior chamber was filled with Viscoat viscoelastic.  A 2.4 millimeter keratome was used to make a near-clear corneal incision at the 10:30 position.  .  A curvilinear capsulorrhexis was made with a cystotome and capsulorrhexis forceps.  Balanced salt solution was used to hydrodissect and hydrodelineate the nucleus.   Phacoemulsification was then used in stop and chop fashion to remove the lens nucleus and epinucleus.  The remaining cortex was then removed using the irrigation and aspiration handpiece. Provisc was then placed into the capsular bag to distend it for lens placement.  A lens was then injected into the capsular bag.  The  remaining viscoelastic was aspirated.   Wounds were hydrated with balanced salt solution.  The anterior chamber was inflated to a physiologic pressure with balanced salt solution.  No wound leaks were noted. Cefuroxime 0.1 ml of a 10mg /ml solution was injected into the anterior chamber for a dose of 1 mg of intracameral antibiotic at the completion of the case.   Timolol and Brimonidine drops were applied to the eye.  The patient was taken to the recovery room in stable condition without complications of anesthesia or surgery.  [name] 11/15/2023, 9:33 AM

## 2023-11-15 NOTE — Anesthesia Postprocedure Evaluation (Signed)
 Anesthesia Post Note  Patient: Linda Bradford  Procedure(s) Performed: PHACOEMULSIFICATION, CATARACT, WITH IOL INSERTION 10.06 01:08.0 (Left: Eye)  Patient location during evaluation: PACU Anesthesia Type: MAC Level of consciousness: awake and alert Pain management: pain level controlled Vital Signs Assessment: post-procedure vital signs reviewed and stable Respiratory status: spontaneous breathing, nonlabored ventilation, respiratory function stable and patient connected to nasal cannula oxygen Cardiovascular status: stable and blood pressure returned to baseline Postop Assessment: no apparent nausea or vomiting Anesthetic complications: no   No notable events documented.   Last Vitals:  Vitals:   11/15/23 0935 11/15/23 0945  BP: (!) 136/56 (!) 136/56  Pulse: 69 71  Resp: 13 10  Temp:  (!) 36.1 C  SpO2: 91% 92%    Last Pain:  Vitals:   11/15/23 0945  TempSrc:   PainSc: 0-No pain                 Keyaria Lawson C Daryn Hicks

## 2023-11-15 NOTE — Transfer of Care (Signed)
 Immediate Anesthesia Transfer of Care Note  Patient: Linda Bradford  Procedure(s) Performed: PHACOEMULSIFICATION, CATARACT, WITH IOL INSERTION 10.06 01:08.0 (Left: Eye)  Patient Location: PACU  Anesthesia Type: MAC  Level of Consciousness: awake, alert  and patient cooperative  Airway and Oxygen Therapy: Patient Spontanous Breathing and Patient connected to supplemental oxygen  Post-op Assessment: Post-op Vital signs reviewed, Patient's Cardiovascular Status Stable, Respiratory Function Stable, Patent Airway and No signs of Nausea or vomiting  Post-op Vital Signs: Reviewed and stable  Complications: No notable events documented.

## 2023-11-15 NOTE — H&P (Signed)
 [facility]   Primary Care Physician:  Tasia Farr, FNP Ophthalmologist: Dr. Annell Kidney  Pre-Procedure History & Physical: HPI:  Linda Bradford is a 68 y.o. female here for ophthalmic surgery.   Past Medical History:  Diagnosis Date   Anemia    Anxiety    Aortic atherosclerosis (HCC)    Arthritis    Cancer (HCC)    skin cancer, removed   Chronic bronchitis (HCC)    COPD (chronic obstructive pulmonary disease) (HCC)    Depression    Diabetes mellitus without complication (HCC)    Disordered sleep 01/13/2014   Dyspnea    Dysrhythmia    occasional pvc's. not being treated.   Essential tremor    Fatigue    Generalized anxiety disorder    GERD (gastroesophageal reflux disease)    Headache    Heart murmur    Hypertension    Hypothyroidism    Pneumonia    Smoker    Thyroid disease    Tremors of nervous system     Past Surgical History:  Procedure Laterality Date   APPENDECTOMY     CATARACT EXTRACTION W/PHACO Right 11/01/2023   Procedure: PHACOEMULSIFICATION, CATARACT, WITH IOL INSERTION 9.83 00:47.4;  Surgeon: Annell Kidney, MD;  Location: [facility];  Service: Ophthalmology;  Laterality: Right;   CHOLECYSTECTOMY N/A 07/01/2019   Procedure: LAPAROSCOPIC CHOLECYSTECTOMY;  Surgeon: Emmalene Hare, MD;  Location: [facility];  Service: General;  Laterality: N/A;   COLECTOMY WITH COLOSTOMY CREATION/HARTMANN PROCEDURE N/A 09/03/2018   Error, this was not done.   COLONOSCOPY WITH PROPOFOL N/A 03/08/2019   Procedure: COLONOSCOPY WITH PROPOFOL;  Surgeon: Irby Mannan, MD;  Location: [facility];  Service: Endoscopy;  Laterality: N/A;   COLONOSCOPY WITH PROPOFOL N/A 07/05/2023   Procedure: COLONOSCOPY WITH PROPOFOL;  Surgeon: Selena Daily, MD;  Location: [facility]COPY;  Service: Gastroenterology;  Laterality: N/A;   CYSTOSCOPY WITH STENT PLACEMENT Bilateral 09/03/2018   Procedure: CYSTOSCOPY WITH STENT PLACEMENT-LIGHTED STENTS;   Surgeon: Dustin Gimenez, MD;  Location: [facility];  Service: Urology;  Laterality: Bilateral;   LAPAROSCOPIC SIGMOID COLECTOMY N/A 09/03/2018   Procedure: LAPAROSCOPIC SIGMOID COLECTOMY;  Surgeon: Emmalene Hare, MD;  Location: [facility];  Service: General;  Laterality: N/A;   percutaneous drainage tube  07/2018   2nd tube placed. not healing in colon, causing a fistula   POLYPECTOMY  07/05/2023   Procedure: POLYPECTOMY;  Surgeon: Selena Daily, MD;  Location: [facility];  Service: Gastroenterology;;   TONSILLECTOMY     TOTAL KNEE ARTHROPLASTY Left 05/10/2021   Procedure: TOTAL KNEE ARTHROPLASTY;  Surgeon: Jerlyn Moons, MD;  Location: [facility];  Service: Orthopedics;  Laterality: Left;   TOTAL KNEE ARTHROPLASTY Right 01/10/2022   Procedure: TOTAL KNEE ARTHROPLASTY;  Surgeon: Jerlyn Moons, MD;  Location: [facility];  Service: Orthopedics;  Laterality: Right;   TUBAL LIGATION      Prior to Admission medications   Medication Sig Start Date End Date Taking? Authorizing Provider  acetaminophen (TYLENOL) 500 MG tablet Take 1,000 mg by mouth every 6 (six) hours as needed for mild pain or moderate pain.    Yes [provider]  albuterol (VENTOLIN HFA) 108 (90 Base) MCG/ACT inhaler Inhale 2 puffs into the lungs every 6 (six) hours as needed for wheezing or shortness of breath. 11/07/23  Yes Pardue, Asencion Blacksmith, DO  amLODipine (NORVASC) 5 MG tablet TAKE 1 TABLET(5 MG) BY MOUTH DAILY 07/04/23  Yes Normie Becton, FNP  Cholecalciferol (VITAMIN  D) 50 MCG (2000 UT) tablet Take 2,000 Units by mouth daily.   Yes [provider]  diclofenac Sodium (VOLTAREN) 1 % GEL    Yes [provider]  DULoxetine (CYMBALTA) 60 MG capsule Take 1 capsule (60 mg total) by mouth 2 (two) times daily. 07/03/23  Yes Rufus Council A, FNP  fluconazole (DIFLUCAN) 150 MG tablet Take 1 tablet (150 mg total) by mouth every three (3) days as needed for up to 2 doses. 08/16/23  Yes Ostwalt, Janna, PA-C   fluticasone (FLONASE) 50 MCG/ACT nasal spray USE 2 SPRAYS IN BOTH NOSTRILS DAILY. 06/14/23  Yes Iona Manis T, FNP  Fluticasone-Umeclidin-Vilant (TRELEGY ELLIPTA) 100-62.5-25 MCG/ACT AEPB Inhale 1 puff into the lungs daily. 08/31/23  Yes Clifton, Kellie A, FNP  gabapentin (NEURONTIN) 300 MG capsule Take 1 capsule (300 mg total) by mouth 2 (two) times daily. Take 2 capsules PO every morning; Take 3 capsules PO every evening. 11/03/22  Yes Normie Becton, FNP  levothyroxine (SYNTHROID) 75 MCG tablet Take 1 tablet (75 mcg total) by mouth daily before breakfast. 08/29/23  Yes Clifton, Kellie A, FNP  loperamide (IMODIUM A-D) 2 MG tablet Take 2-4 mg by mouth 4 (four) times daily as needed for diarrhea or loose stools.   Yes [provider]  metformin (FORTAMET) 1000 MG (OSM) 24 hr tablet Take 1,000 mg by mouth 2 (two) times daily with a meal.   Yes [provider]  metoprolol succinate (TOPROL-XL) 50 MG 24 hr tablet Take 1 tablet (50 mg total) by mouth daily. 11/08/23  Yes Pardue, Asencion Blacksmith, DO  montelukast (SINGULAIR) 10 MG tablet Take 1 tablet (10 mg total) by mouth at bedtime. 08/03/23  Yes Iona Manis T, FNP  pantoprazole (PROTONIX) 20 MG tablet TAKE 1 TABLET(20 MG) BY MOUTH TWICE DAILY BEFORE A MEAL 09/20/23  Yes Clifton, Kellie A, FNP  primidone (MYSOLINE) 50 MG tablet Take 0.5 tablets (25 mg total) by mouth 2 (two) times daily. 07/01/22  Yes Zhang, Dekui, MD  rosuvastatin (CRESTOR) 10 MG tablet TAKE 1 TABLET (10 MG TOTAL) BY MOUTH DAILY. 06/07/23  Yes Normie Becton, FNP  traMADol (ULTRAM) 50 MG tablet Take 50 mg by mouth 2 (two) times daily as needed for moderate pain. 09/29/22  Yes [provider]  blood glucose meter kit and supplies Dispense based on patient and insurance preference. Once daily. (FOR ICD-10 E10.9, E11.9). 07/12/22   Normie Becton, FNP  buPROPion (WELLBUTRIN XL) 300 MG 24 hr tablet Take 1 tablet (300 mg total) by mouth daily. Patient not taking: Reported on  11/07/2023 09/28/23   Tasia Farr, FNP  ezetimibe (ZETIA) 10 MG tablet Take 1 tablet (10 mg total) by mouth daily. 06/13/23 09/11/23  Gollan, Timothy J, MD  glucose blood (CONTOUR NEXT TEST) test strip To check blood sugar once daily 06/09/17   Burnette, Jennifer M, PA-C  ipratropium-albuterol (DUONEB) 0.5-2.5 (3) MG/3ML SOLN Take 3 mLs by nebulization every 6 (six) hours as needed. 08/29/22   Normie Becton, FNP  meloxicam (MOBIC) 15 MG tablet Take 1 tablet (15 mg total) by mouth daily. 10/31/23   Clifton, Kellie A, FNP  nystatin (MYCOSTATIN) 100000 UNIT/ML suspension Take 5 mLs (500,000 Units total) by mouth 4 (four) times daily. Patient not taking: Reported on 10/18/2023 10/20/22   Iona Manis T, FNP  nystatin (MYCOSTATIN/NYSTOP) powder Apply 1 application topically 3 (three) times daily. 10/01/21   Normie Becton, FNP    Allergies as of 10/18/2023 - Review  Complete 10/18/2023  Allergen Reaction Noted   Dexilant [dexlansoprazole] Nausea Only and Other (See Comments) 07/28/2016    Family History  Problem Relation Age of Onset   Lung cancer Mother    Alcohol abuse Sister    Arthritis Sister    Alcohol abuse Brother    Heart disease Brother 44   Heart attack Brother    Pancreatic cancer Maternal Aunt    Diabetes Maternal Aunt    Alcohol abuse Son    Drug abuse Son    Drug abuse Son    Heart disease Father 76   Diabetes Sister    Breast cancer Brother    Alcohol abuse Maternal Uncle    Alcohol abuse Maternal Grandmother    Diabetes Maternal Grandmother     Social History   Socioeconomic History   Marital status: Widowed    Spouse name: Linda Bradford   Number of children: 2   Years of education: Not on file   Highest education level: Not on file  Occupational History   Occupation: Energy manager    Comment: currently employed  Tobacco Use   Smoking status: Every Day    Current packs/day: 1.50    Average packs/day: 2.0 packs/day for 45.5 years (88.9 ttl pk-yrs)    Types: Cigarettes     Start date: 03/02/1980   Smokeless tobacco: Never   Tobacco comments:    1.5PPD 05/16/2022  Vaping Use   Vaping status: Former   Devices: only for a month. did not like  Substance and Sexual Activity   Alcohol use: No    Alcohol/week: 0.0 standard drinks of alcohol   Drug use: No   Sexual activity: Not on file  Other Topics Concern   Not on file  Social History Narrative   Live with hubby at home, sister susie with down syndrome   Social Drivers of Health   Financial Resource Strain: Low Risk  (05/01/2023)   Overall Financial Resource Strain (CARDIA)    Difficulty of Paying Living Expenses: Not hard at all  Food Insecurity: No Food Insecurity (05/01/2023)   Hunger Vital Sign    Worried About Running Out of Food in the Last Year: Never true    Ran Out of Food in the Last Year: Never true  Transportation Needs: No Transportation Needs (05/01/2023)   PRAPARE - Administrator, Civil Service (Medical): No    Lack of Transportation (Non-Medical): No  Physical Activity: Inactive (05/01/2023)   Exercise Vital Sign    Days of Exercise per Week: 0 days    Minutes of Exercise per Session: 0 min  Stress: Stress Concern Present (05/01/2023)   [employer] of Occupational Health - Occupational Stress Questionnaire    Feeling of Stress : To some extent  Social Connections: Moderately Integrated (05/01/2023)   Social Connection and Isolation Panel [NHANES]    Frequency of Communication with Friends and Family: Once a week    Frequency of Social Gatherings with Friends and Family: Never    Attends Religious Services: More than 4 times per year    Active Member of [employer] or Organizations: Yes    Attends Banker Meetings: 1 to 4 times per year    Marital Status: Married  Catering manager Violence: Not At Risk (05/01/2023)   Humiliation, Afraid, Rape, and Kick questionnaire    Fear of Current or Ex-Partner: No    Emotionally Abused: No    Physically Abused: No     Sexually Abused: No  Review of Systems: See HPI, otherwise negative ROS  Physical Exam: BP (!) 129/57   Pulse 72   Temp 98 F (36.7 C) (Temporal)   Resp 18   Ht 5\' 11"  (1.803 m)   Wt 101.6 kg   SpO2 95%   BMI 31.24 kg/m  General:   Alert,  pleasant and cooperative in NAD Head:  Normocephalic and atraumatic. Lungs:  Clear to auscultation.    Heart:  Regular rate and rhythm.   Impression/Plan: CERI MAYER is here for ophthalmic surgery.  Risks, benefits, limitations, and alternatives regarding ophthalmic surgery have been reviewed with the patient.  Questions have been answered.  All parties agreeable.   Annell Kidney, MD  11/15/2023, 8:50 AM

## 2023-11-17 ENCOUNTER — Ambulatory Visit: Payer: Self-pay

## 2023-11-17 NOTE — Telephone Encounter (Signed)
 Copied from CRM [id number]. Topic: Clinical - Red Word Triage >> Nov 17, 2023  9:36 AM Ethelle Herb L wrote: Red Word that prompted transfer to Nurse Triage: cough, coughing dark green sputum   Chief Complaint: Cough Symptoms: Cough, green sputum production, sinus congestion  Frequency: Frequent  Pertinent Negatives: Patient denies fever Disposition: [] ED /[x] Urgent Care (no appt availability in office) / [] Appointment(In office/virtual)/ []  [facility]/ [] Home Care/ [] Refused Recommended Disposition /[] [facility]/ []  Follow-up with PCP Additional Notes: Patient reports having a chest cold for the last 2-3 weeks. She states that she is experiencing a cough, mild difficulty breathing, green sputum production, and sinus congestion. She denies any fevers. Patient would like to be seen today. I advised the patient that there are no available appointments and that she could go to urgent care for treatment today. Patient verbalized understanding of this plan.     Reason for Disposition  Cough has been present for > 3 weeks  Answer Assessment - Initial Assessment Questions 1. ONSET: "When did the cough begin?"      2-3 weeks ago 2. SEVERITY: "How bad is the cough today?"      Moderate  3. SPUTUM: "Describe the color of your sputum" (none, dry cough; clear, white, yellow, green)     Green 4. HEMOPTYSIS: "Are you coughing up any blood?" If so ask: "How much?" (flecks, streaks, tablespoons, etc.)     No 5. DIFFICULTY BREATHING: "Are you having difficulty breathing?" If Yes, ask: "How bad is it?" (e.g., mild, moderate, severe)    - MILD: No SOB at rest, mild SOB with walking, speaks normally in sentences, can lie down, no retractions, pulse < 100.    - MODERATE: SOB at rest, SOB with minimal exertion and prefers to sit, cannot lie down flat, speaks in phrases, mild retractions, audible wheezing, pulse 100-120.    - SEVERE: Very SOB at rest, speaks in single words, struggling to  breathe, sitting hunched forward, retractions, pulse > 120      Mild 6. FEVER: "Do you have a fever?" If Yes, ask: "What is your temperature, how was it measured, and when did it start?"     No 7. CARDIAC HISTORY: "Do you have any history of heart disease?" (e.g., heart attack, congestive heart failure)      No 8. LUNG HISTORY: "Do you have any history of lung disease?"  (e.g., pulmonary embolus, asthma, emphysema)     Yes 9. PE RISK FACTORS: "Do you have a history of blood clots?" (or: recent major surgery, recent prolonged travel, bedridden)     No 10. OTHER SYMPTOMS: "Do you have any other symptoms?" (e.g., runny nose, wheezing, chest pain)       Sinus congestion  Protocols used: Cough - Acute Productive-A-AH

## 2023-11-30 ENCOUNTER — Ambulatory Visit: Admitting: Family Medicine

## 2023-12-14 ENCOUNTER — Telehealth: Payer: Self-pay | Admitting: Family Medicine

## 2023-12-14 NOTE — Telephone Encounter (Signed)
 Disp Refills Start End   metoprolol  succinate (TOPROL -XL) 50 MG 24 hr tablet 90 tablet 3 11/08/2023 --   Sig - Route: Take 1 tablet (50 mg total) by mouth daily. - Oral   Sent to pharmacy as: metoprolol  succinate (TOPROL -XL) 50 MG 24 hr tablet   E-Prescribing Status: Receipt confirmed by pharmacy (11/08/2023 11:32 AM EDT)   Was sent to [facility].   Need to verify pharmacy since [facility] pharmacy is requesting refills and a 90 day supplied was sent in 11/08/2023 qty:90 with R:3  I have left a message for patient to return call.

## 2023-12-14 NOTE — Telephone Encounter (Signed)
 [facility] faxed refill request for the following medications:   metoprolol succinate (TOPROL-XL) 50 MG 24 hr tablet     Please advise.

## 2023-12-18 ENCOUNTER — Other Ambulatory Visit: Payer: Self-pay

## 2023-12-18 DIAGNOSIS — I10 Essential (primary) hypertension: Secondary | ICD-10-CM

## 2023-12-18 DIAGNOSIS — G250 Essential tremor: Secondary | ICD-10-CM

## 2023-12-18 MED ORDER — METOPROLOL SUCCINATE ER 50 MG PO TB24
50.0000 mg | ORAL_TABLET | Freq: Every day | ORAL | 3 refills | Status: AC
Start: 2023-12-18 — End: ?

## 2023-12-18 NOTE — Telephone Encounter (Signed)
 Received another fax from [facility] requesting this medication.  It looks like the refill request from 11/08/2023 was from [facility] also but the medication was sent to [facility] instead.

## 2023-12-26 ENCOUNTER — Other Ambulatory Visit: Payer: Self-pay | Admitting: Family Medicine

## 2023-12-26 DIAGNOSIS — E1149 Type 2 diabetes mellitus with other diabetic neurological complication: Secondary | ICD-10-CM

## 2023-12-26 NOTE — Telephone Encounter (Signed)
 LOV 1*2*25 NOV 10*1*25 LRF  [id number] LABS 08/03/23

## 2023-12-26 NOTE — Telephone Encounter (Signed)
[facility] faxed refill request for the following medications:  gabapentin (NEURONTIN) 300 MG capsule   Please advise. 

## 2023-12-26 NOTE — Telephone Encounter (Unsigned)
 Copied from CRM [id number]. Topic: Clinical - Medication Refill >> Dec 26, 2023  9:45 AM Everette C wrote: Medication: primidone  (MYSOLINE ) 50 MG tablet [[id number]]  Has the patient contacted their pharmacy? Yes (Agent: If no, request that the patient contact the pharmacy for the refill. If patient does not wish to contact the pharmacy document the reason why and proceed with request.) (Agent: If yes, when and what did the pharmacy advise?)  This is the patient's preferred pharmacy:  [facility] #[id number] [place], [place] - [address] AT [facility] OF [address] [place] [address] Phone: [phone] Fax: [phone]  Is this the correct pharmacy for this prescription? Yes If no, delete pharmacy and type the correct one.   Has the prescription been filled recently? No  Is the patient out of the medication? Yes  Has the patient been seen for an appointment in the last year OR does the patient have an upcoming appointment? Yes  Can we respond through MyChart? No  Agent: Please be advised that Rx refills may take up to 3 business days. We ask that you follow-up with your pharmacy.

## 2023-12-27 ENCOUNTER — Telehealth: Payer: Self-pay | Admitting: Family Medicine

## 2023-12-27 NOTE — Telephone Encounter (Signed)
 Called patient back and left message to call back and update us  on exactly what we should be looking for in the fax from [facility] because as of right now I do not see anything recent sent from them

## 2023-12-27 NOTE — Telephone Encounter (Signed)
 Copied from CRM [id number]. Topic: Clinical - Medication Refill >> Dec 27, 2023  2:57 PM Fredrica W wrote: Patient called back to check status of medication request. States previous Pt of Meredith Stalls who said she was going to take over this prescription. Has appt 6/30 but is out of the medication. Would like a call back with an update. Thank You  >> Dec 27, 2023  9:22 AM Zipporah Him wrote: Patient hasn't heard anything on getting a refill of this medication, she states she's been out for days.

## 2023-12-28 ENCOUNTER — Telehealth: Payer: Self-pay

## 2023-12-28 NOTE — Telephone Encounter (Signed)
 This prescriptions needs to come from Neurology   Copied from CRM [id number]. Topic: Clinical - Medication Question >> Dec 28, 2023  2:51 PM Sophia H wrote: Reason for CRM: pt calling in regarding medication primidone  (MYSOLINE ) 50 MG tablet. Please advise, doesn't look like it's been filled on my end. Pt is requesting a cb ASAP [phone], states her shaking is getting worse   [id number] - [place], [place] - [address]

## 2023-12-30 ENCOUNTER — Other Ambulatory Visit: Payer: Self-pay | Admitting: Family Medicine

## 2024-01-01 ENCOUNTER — Other Ambulatory Visit: Payer: Self-pay | Admitting: Family Medicine

## 2024-01-01 ENCOUNTER — Telehealth: Payer: Self-pay | Admitting: Family Medicine

## 2024-01-01 DIAGNOSIS — E1149 Type 2 diabetes mellitus with other diabetic neurological complication: Secondary | ICD-10-CM

## 2024-01-01 NOTE — Telephone Encounter (Signed)
 Duplicate request

## 2024-01-01 NOTE — Telephone Encounter (Signed)
 Copied from CRM [id number]. Topic: Clinical - Medication Refill >> Jan 01, 2024  8:40 AM [name] wrote: Medication: gabapentin  (NEURONTIN ) 300 MG capsule  Has the patient contacted their pharmacy? Yes (Agent: If no, request that the patient contact the pharmacy for the refill. If patient does not wish to contact the pharmacy document the reason why and proceed with request.) (Agent: If yes, when and what did the pharmacy advise?) Pharmacy needs order to refill  This is the patient's preferred pharmacy:  [facility] #[id number] [place], [place] - [address] AT [facility] OF [address] [place] [address] Phone: [phone] Fax: [phone]   Is this the correct pharmacy for this prescription? Yes If no, delete pharmacy and type the correct one.   Has the prescription been filled recently? Yes  Is the patient out of the medication? Yes - She has been out since last Thursday. She would like enough medication to do her until her next appointment.   Has the patient been seen for an appointment in the last year OR does the patient have an upcoming appointment? Yes - June 30 at 2:40 PM  Can we respond through MyChart? Yes  Agent: Please be advised that Rx refills may take up to 3 business days. We ask that you follow-up with your pharmacy.

## 2024-01-01 NOTE — Telephone Encounter (Signed)
 Copied from CRM [id number]. Topic: Clinical - Medication Refill >> Jan 01, 2024  2:42 PM Sophia H wrote: Medication: gabapentin  (NEURONTIN ) 300 MG capsule  Has the patient contacted their pharmacy? Yes (Agent: If no, request that the patient contact the pharmacy for the refill. If patient does not wish to contact the pharmacy document the reason why and proceed with request.) (Agent: If yes, when and what did the pharmacy advise?)  This is the patient's preferred pharmacy:  [facility] #[id number] [place], [place] - [address] AT [facility] OF [address] [place] [address] Phone: [phone] Fax: [phone]   Is this the correct pharmacy for this prescription? Yes If no, delete pharmacy and type the correct one.   Has the prescription been filled recently? Yes  Is the patient out of the medication? Yes  Has the patient been seen for an appointment in the last year OR does the patient have an upcoming appointment? Yes  Can we respond through MyChart? Yes  Agent: Please be advised that Rx refills may take up to 3 business days. We ask that you follow-up with your pharmacy.

## 2024-01-02 MED ORDER — GABAPENTIN 300 MG PO CAPS
300.0000 mg | ORAL_CAPSULE | Freq: Two times a day (BID) | ORAL | 1 refills | Status: DC
Start: 2024-01-02 — End: 2024-05-02

## 2024-01-02 NOTE — Telephone Encounter (Signed)
 Requested Prescriptions  Pending Prescriptions Disp Refills   gabapentin  (NEURONTIN ) 300 MG capsule 60 capsule 1    Sig: Take 1 capsule (300 mg total) by mouth 2 (two) times daily. Take 2 capsules PO every morning; Take 3 capsules PO every evening.     Neurology: Anticonvulsants - gabapentin  Failed - 01/02/2024 11:12 AM      Failed - Valid encounter within last 12 months    Recent Outpatient Visits   None            Passed - Cr in normal range and within 360 days    Creat  Date Value Ref Range Status  05/19/2017 0.58 0.50 - 0.99 mg/dL Final    Comment:    For patients >58 years of age, the reference limit for Creatinine is approximately 13% higher for people identified as African-American. .    Creatinine, Ser  Date Value Ref Range Status  08/03/2023 0.65 0.57 - 1.00 mg/dL Final         Passed - Completed PHQ-2 or PHQ-9 in the last 360 days

## 2024-01-12 ENCOUNTER — Telehealth: Payer: Self-pay | Admitting: Family Medicine

## 2024-01-12 NOTE — Telephone Encounter (Signed)
 Called patient and left voicemail that 01/29/24 appt has been cancelled due provider not available and appt needs to be reschedule

## 2024-01-19 ENCOUNTER — Other Ambulatory Visit: Payer: Self-pay | Admitting: Family Medicine

## 2024-01-19 ENCOUNTER — Other Ambulatory Visit: Payer: Self-pay

## 2024-01-19 ENCOUNTER — Telehealth: Payer: Self-pay | Admitting: Family Medicine

## 2024-01-19 DIAGNOSIS — I152 Hypertension secondary to endocrine disorders: Secondary | ICD-10-CM

## 2024-01-19 DIAGNOSIS — E1159 Type 2 diabetes mellitus with other circulatory complications: Secondary | ICD-10-CM

## 2024-01-19 DIAGNOSIS — F32A Depression, unspecified: Secondary | ICD-10-CM

## 2024-01-19 MED ORDER — AMLODIPINE BESYLATE 5 MG PO TABS: ORAL_TABLET | ORAL | 0 refills | Status: DC

## 2024-01-19 NOTE — Telephone Encounter (Signed)
 [facility] pharmacy is requesting refill amLODipine (NORVASC) 5 MG tablet   Please advise

## 2024-01-19 NOTE — Telephone Encounter (Signed)
 Medication refilled

## 2024-01-29 ENCOUNTER — Ambulatory Visit: Admitting: Family Medicine

## 2024-01-29 ENCOUNTER — Encounter: Payer: Self-pay | Admitting: Family Medicine

## 2024-01-29 VITALS — BP 118/53 | HR 75 | Resp 16 | Wt 221.3 lb

## 2024-01-29 DIAGNOSIS — B3781 Candidal esophagitis: Secondary | ICD-10-CM

## 2024-01-29 DIAGNOSIS — Z7951 Long term (current) use of inhaled steroids: Secondary | ICD-10-CM | POA: Diagnosis not present

## 2024-01-29 DIAGNOSIS — E1149 Type 2 diabetes mellitus with other diabetic neurological complication: Secondary | ICD-10-CM

## 2024-01-29 DIAGNOSIS — B370 Candidal stomatitis: Secondary | ICD-10-CM

## 2024-01-29 DIAGNOSIS — Z7984 Long term (current) use of oral hypoglycemic drugs: Secondary | ICD-10-CM

## 2024-01-29 DIAGNOSIS — J40 Bronchitis, not specified as acute or chronic: Secondary | ICD-10-CM

## 2024-01-29 LAB — POCT GLYCOSYLATED HEMOGLOBIN (HGB A1C)
Est. average glucose Bld gHb Est-mCnc: 206
Hemoglobin A1C: 8.8 % — AB (ref 4.0–5.6)

## 2024-01-29 MED ORDER — AZITHROMYCIN 250 MG PO TABS: ORAL_TABLET | ORAL | 0 refills | Status: AC

## 2024-01-29 MED ORDER — NYSTATIN 100000 UNIT/ML MT SUSP: 5.0000 mL | Freq: Four times a day (QID) | OROMUCOSAL | 0 refills | Status: DC

## 2024-01-29 MED ORDER — FLUCONAZOLE 150 MG PO TABS: 150.0000 mg | ORAL_TABLET | Freq: Once | ORAL | 0 refills | Status: AC

## 2024-01-29 MED ORDER — TIRZEPATIDE 2.5 MG/0.5ML ~~LOC~~ SOAJ: 2.5000 mg | SUBCUTANEOUS | 1 refills | Status: DC

## 2024-01-29 NOTE — Patient Instructions (Signed)
 Marland Kitchen  Please review the attached list of medications and notify my office if there are any errors.   . Please bring all of your medications to every appointment so we can make sure that our medication list is the same as yours.

## 2024-01-29 NOTE — Progress Notes (Signed)
 Established patient visit   Patient: Linda Bradford   DOB: Oct 07, 1955   69 y.o. Female  MRN: [id number] Visit Date: 01/29/2024  Today's healthcare provider: Nancyann Perry, MD   Chief Complaint  Patient presents with   Diabetes    Metformin  GI symptoms/side effect worsening.   Sinus Problem    Sinus draining for 2 weeks. Patient has been using Flonase    Subjective    Discussed the use of AI scribe software for clinical note transcription with the patient, who gave verbal consent to proceed.  History of Present Illness   Linda Bradford is a 68 year old female with type 2 diabetes who presents for follow-up of blood sugar management and medication side effects.  She experiences severe diarrhea associated with metformin , which has been intense enough to cause leakage and impact her daily activities, stating 'I couldn't even go outside.' To manage this, she adjusted her medication intake to every other night, which helped alleviate the diarrhea. She has a history of being taken off metformin  due to similar issues, but her A1c had increased, necessitating its reintroduction. Currently, she takes 750 mg of metformin  in the morning and at night, using 500 mg tablets, equating to one and a half pills twice daily. Her blood sugar readings typically range between 130 and 140 mg/dL, with occasional spikes to 200 mg/dL.  She has COPD and is a smoker. She has been experiencing a cough with green sputum for the past couple of weeks, which she attributes to sinus drainage. She describes the sputum as having a 'weird taste.' She uses Trelegy regularly for her COPD management. Recently, her breathing has worsened, particularly in the last few days.  Regarding her medication use, she mentions that she has been prescribed Augmentin  in the past but did not find it effective. She typically experiences yeast infections when taking antibiotics and uses Diflucan  to manage this. She also uses an oral suspension  for mouth care, which she is running low on.       Medications: Outpatient Medications Prior to Visit  Medication Sig   acetaminophen  (TYLENOL ) 500 MG tablet Take 1,000 mg by mouth every 6 (six) hours as needed for mild pain or moderate pain.    albuterol  (VENTOLIN  HFA) 108 (90 Base) MCG/ACT inhaler Inhale 2 puffs into the lungs every 6 (six) hours as needed for wheezing or shortness of breath.   amLODipine  (NORVASC ) 5 MG tablet TAKE 1 TABLET(5 MG) BY MOUTH DAILY   blood glucose meter kit and supplies Dispense based on patient and insurance preference. Once daily. (FOR ICD-10 E10.9, E11.9).   Cholecalciferol  (VITAMIN D ) 50 MCG (2000 UT) tablet Take 2,000 Units by mouth daily.   diclofenac  Sodium (VOLTAREN ) 1 % GEL    DULoxetine  (CYMBALTA ) 60 MG capsule TAKE 1 CAPSULE BY MOUTH TWICE DAILY   ezetimibe  (ZETIA ) 10 MG tablet Take 1 tablet (10 mg total) by mouth daily.   fluticasone  (FLONASE ) 50 MCG/ACT nasal spray USE 2 SPRAYS IN BOTH NOSTRILS DAILY.   Fluticasone -Umeclidin-Vilant (TRELEGY ELLIPTA ) 100-62.5-25 MCG/ACT AEPB Inhale 1 puff into the lungs daily.   gabapentin  (NEURONTIN ) 300 MG capsule Take 1 capsule (300 mg total) by mouth 2 (two) times daily. Take 2 capsules PO every morning; Take 3 capsules PO every evening.   glucose blood (CONTOUR NEXT TEST) test strip To check blood sugar once daily   ipratropium-albuterol  (DUONEB) 0.5-2.5 (3) MG/3ML SOLN Take 3 mLs by nebulization every 6 (six) hours as needed.  levothyroxine  (SYNTHROID ) 75 MCG tablet Take 1 tablet (75 mcg total) by mouth daily before breakfast.   loperamide  (IMODIUM  A-D) 2 MG tablet Take 2-4 mg by mouth 4 (four) times daily as needed for diarrhea or loose stools.   meloxicam  (MOBIC ) 15 MG tablet Take 1 tablet (15 mg total) by mouth daily.   metFORMIN  (GLUCOPHAGE -XR) 500 MG 24 hr tablet Take 1,000 mg by mouth 2 (two) times daily.   metoprolol  succinate (TOPROL -XL) 50 MG 24 hr tablet Take 1 tablet (50 mg total) by mouth daily.    montelukast  (SINGULAIR ) 10 MG tablet Take 1 tablet (10 mg total) by mouth at bedtime.   nystatin  (MYCOSTATIN /NYSTOP ) powder Apply 1 application topically 3 (three) times daily.   pantoprazole  (PROTONIX ) 20 MG tablet TAKE 1 TABLET(20 MG) BY MOUTH TWICE DAILY BEFORE A MEAL   primidone  (MYSOLINE ) 50 MG tablet Take 0.5 tablets (25 mg total) by mouth 2 (two) times daily.   rosuvastatin  (CRESTOR ) 10 MG tablet TAKE 1 TABLET (10 MG TOTAL) BY MOUTH DAILY.   traMADol  (ULTRAM ) 50 MG tablet Take 50 mg by mouth 2 (two) times daily as needed for moderate pain.   nystatin  (MYCOSTATIN ) 100000 UNIT/ML suspension Take 5 mLs (500,000 Units total) by mouth 4 (four) times daily. (Patient not taking: Reported on 01/29/2024)   [DISCONTINUED] buPROPion  (WELLBUTRIN  XL) 300 MG 24 hr tablet Take 1 tablet (300 mg total) by mouth daily. (Patient not taking: Reported on 11/07/2023)   No facility-administered medications prior to visit.   Review of Systems  Constitutional:  Negative for appetite change, chills, fatigue and fever.  Respiratory:  Negative for chest tightness and shortness of breath.   Cardiovascular:  Negative for chest pain and palpitations.  Gastrointestinal:  Negative for abdominal pain, nausea and vomiting.  Neurological:  Negative for dizziness and weakness.       Objective    BP (!) 118/53 (BP Location: Left Arm, Patient Position: Sitting, Cuff Size: Normal)   Pulse 75   Resp 16   Wt 221 lb 4.8 oz (100.4 kg)   SpO2 97%   BMI 30.87 kg/m   Physical Exam   General Appearance:    Mildly obese female, alert, cooperative, in no acute distress  HENT:   bilateral TM normal without fluid or infection, neck without nodes, frontal sinus tender, and nasal mucosa congested  Eyes:    PERRL, conjunctiva/corneas clear, EOM's intact       Lungs:     Scattered wheezes, good air movement. , respirations unlabored  Heart:    Normal heart rate. Normal rhythm. No murmurs, rubs, or gallops.    Neurologic:    Awake, alert, oriented x 3. No apparent focal neurological           defect.        Results for orders placed or performed in visit on 01/29/24  POCT glycosylated hemoglobin (Hb A1C)  Result Value Ref Range   Hemoglobin A1C 8.8 (A) 4.0 - 5.6 %   Est. average glucose Bld gHb Est-mCnc 206      Assessment & Plan        Type 2 Diabetes Mellitus Suboptimal control with A1c at 8.6. Severe diarrhea with metformin , adjusted dosing. Blood glucose 130-200. Discussed Mounjaro for glycemic control and weight loss. No contraindications. Insurance coverage confirmed, prior authorization required. - Prescribe Mounjaro 2.5 mg once weekly, titrate to 5 mg after one month. - Continue metformin  every other night until Mounjaro is established. - Submit prior authorization for Mounjaro. -  Send prescription to [facility] at [facility] and [address].  Chronic Obstructive Pulmonary Disease (COPD) Increased coughing and green sputum suggest infection. Trelegy used regularly. Wheezing and sinus drainage present. Augmentin  ineffective, azithromycin  recommended. - Prescribe azithromycin  (Z-Pak) for suspected bacterial infection. - Prescribe Diflucan  to prevent antibiotic-associated yeast infection. - Refill oral suspension for mouth care. - Send prescriptions to [facility] at [facility] and [address].  Follow-up Follow-up needed to evaluate diabetes medication effectiveness and monitor COPD symptoms. - Schedule follow-up appointment in three months. - Coordinate follow-up with Burnard Boom.    Return in about 3 months (around 04/30/2024) for Diabetes.     Nancyann Perry, MD  [facility] [phone] (phone) [phone] (fax)  [facility]

## 2024-02-01 ENCOUNTER — Other Ambulatory Visit: Payer: Self-pay | Admitting: Family Medicine

## 2024-02-01 ENCOUNTER — Telehealth: Payer: Self-pay | Admitting: Family Medicine

## 2024-02-01 DIAGNOSIS — J418 Mixed simple and mucopurulent chronic bronchitis: Secondary | ICD-10-CM

## 2024-02-01 DIAGNOSIS — M2550 Pain in unspecified joint: Secondary | ICD-10-CM

## 2024-02-01 MED ORDER — MONTELUKAST SODIUM 10 MG PO TABS: 10.0000 mg | ORAL_TABLET | Freq: Every day | ORAL | 1 refills | Status: AC

## 2024-02-01 NOTE — Telephone Encounter (Signed)
 Filled

## 2024-02-01 NOTE — Telephone Encounter (Signed)
[facility] faxed refill request for the following medications:   montelukast (SINGULAIR) 10 MG tablet   Please advise.  

## 2024-02-19 ENCOUNTER — Other Ambulatory Visit: Payer: Self-pay | Admitting: Family Medicine

## 2024-02-19 DIAGNOSIS — E1149 Type 2 diabetes mellitus with other diabetic neurological complication: Secondary | ICD-10-CM

## 2024-02-19 MED ORDER — TIRZEPATIDE 5 MG/0.5ML ~~LOC~~ SOAJ
5.0000 mg | SUBCUTANEOUS | 2 refills | Status: DC
Start: 2024-02-19 — End: 2024-05-02

## 2024-02-26 ENCOUNTER — Telehealth: Payer: Self-pay | Admitting: Family Medicine

## 2024-02-26 NOTE — Telephone Encounter (Signed)
 [facility] faxed refill request for the following medications:  metFORMIN (GLUCOPHAGE-XR) 500 MG 24 hr tablet   Please advise

## 2024-02-26 NOTE — Telephone Encounter (Signed)
 Left message on voicemail to cal us  back.  According to the last office notes patient was prescribed  Mounjaro  and to continue Metformin  every other night until Mounjaro  is established.

## 2024-02-27 NOTE — Telephone Encounter (Signed)
  Left message to calls us  back

## 2024-02-28 ENCOUNTER — Telehealth: Payer: Self-pay | Admitting: Family Medicine

## 2024-02-28 NOTE — Telephone Encounter (Signed)
 [facility] is requesting refills on Metformin  ER 500 mg. #360 with 3 RF

## 2024-02-29 NOTE — Telephone Encounter (Signed)
 Na/LM to call us  back. If patient calls back, ok for E2C2 to verify if patient has started taking Monjauro?

## 2024-03-17 ENCOUNTER — Encounter: Payer: Self-pay | Admitting: Emergency Medicine

## 2024-03-17 ENCOUNTER — Ambulatory Visit
Admission: EM | Admit: 2024-03-17 | Discharge: 2024-03-17 | Disposition: A | Discharge status: Home / Self Care | Attending: Emergency Medicine | Admitting: Emergency Medicine

## 2024-03-17 DIAGNOSIS — L03032 Cellulitis of left toe: Secondary | ICD-10-CM

## 2024-03-17 MED ORDER — CEPHALEXIN 500 MG PO CAPS: 500.0000 mg | ORAL_CAPSULE | Freq: Four times a day (QID) | ORAL | 0 refills | Status: DC

## 2024-03-17 NOTE — Discharge Instructions (Addendum)
 Today you are evaluated for an infection to your toenail, wound has been drained here in the clinic to help reduce pressure  Take cephalexin  every 6 hours for 5 days for treatment  Cleanse over the affected area with soap and water  during normal hygiene  May cover with a nonstick Band-Aid per preference  You may continue activity if tolerable  Please follow-up if you see no improvement with your symptoms or your symptoms worsen at any point

## 2024-03-17 NOTE — ED Triage Notes (Signed)
 Patient reports pain and swelling to left big toe. Patient concerned about infection. Patient reports had a pedicure end of July and had pain after. Rates 5/10. Patient cut toe nail fluid came out on yesterday. Patient used neosporin with pain relief and put Band-Aid on it.

## 2024-03-17 NOTE — ED Provider Notes (Signed)
 Linda Bradford    CSN: [id number] Arrival date & time: 03/17/24  1152      History   Chief Complaint Chief Complaint  Patient presents with   Toe Pain    HPI Linda Bradford is a 68 y.o. female.   Patient presents for evaluation of throbbing pain, swelling present to the left great toe beginning 4 days ago.  Noticed Linda Bradford purulent drainage underneath the skin 1 day ago.  Had a pedicure completed in July, unsure if related.  Denies fever.  Past Medical History:  Diagnosis Date   Anemia    Anxiety    Aortic atherosclerosis (HCC)    Arthritis    Cancer (HCC)    skin cancer, removed   Chronic bronchitis (HCC)    COPD (chronic obstructive pulmonary disease) (HCC)    Depression    Diabetes mellitus without complication (HCC)    Disordered sleep 01/13/2014   Dyspnea    Dysrhythmia    occasional pvc's. not being treated.   Essential tremor    Fatigue    Generalized anxiety disorder    GERD (gastroesophageal reflux disease)    Headache    Heart murmur    Hypertension    Hypothyroidism    Pneumonia    Smoker    Thyroid  disease    Tremors of nervous system     Patient Active Problem List   Diagnosis Date Noted   Hx of colonic polyps 07/05/2023   Polyp of colon 07/05/2023   Great toe pain, left 07/03/2023   Fall 05/03/2023   Diarrhea due to drug 05/03/2023   Mood disorder (HCC) 05/03/2023   Centrilobular emphysema (HCC) 01/18/2023   Aortic valve calcification 01/18/2023   Colon cancer screening 01/18/2023   Essential tremor 01/18/2023   Aortic atherosclerosis (HCC) 01/18/2023   DDD (degenerative disc disease), lumbosacral 01/18/2023   Diabetes mellitus type 2 with neurological manifestations (HCC) 07/12/2022   Hyponatremia 06/30/2022   Nodule of left lung 09/20/2021   Hyperlipidemia associated with type 2 diabetes mellitus (HCC) 09/02/2021   Hypertension associated with type 2 diabetes mellitus (HCC) 09/02/2021   Adult hypothyroidism 12/17/2013    Current tobacco use 12/17/2013    Past Surgical History:  Procedure Laterality Date   APPENDECTOMY     CATARACT EXTRACTION W/PHACO Right 11/01/2023   Procedure: PHACOEMULSIFICATION, CATARACT, WITH IOL INSERTION 9.83 00:47.4;  Surgeon: Mittie Gaskin, MD;  Location: [facility];  Service: Ophthalmology;  Laterality: Right;   CATARACT EXTRACTION W/PHACO Left 11/15/2023   Procedure: PHACOEMULSIFICATION, CATARACT, WITH IOL INSERTION 10.06 01:08.0;  Surgeon: Mittie Gaskin, MD;  Location: [facility];  Service: Ophthalmology;  Laterality: Left;   CHOLECYSTECTOMY N/A 07/01/2019   Procedure: LAPAROSCOPIC CHOLECYSTECTOMY;  Surgeon: Desiderio Schanz, MD;  Location: [facility];  Service: General;  Laterality: N/A;   COLECTOMY WITH COLOSTOMY CREATION/HARTMANN PROCEDURE N/A 09/03/2018   Error, this was not done.   COLONOSCOPY WITH PROPOFOL  N/A 03/08/2019   Procedure: COLONOSCOPY WITH PROPOFOL ;  Surgeon: Janalyn Keene NOVAK, MD;  Location: [facility];  Service: Endoscopy;  Laterality: N/A;   COLONOSCOPY WITH PROPOFOL  N/A 07/05/2023   Procedure: COLONOSCOPY WITH PROPOFOL ;  Surgeon: Unk Corinn Skiff, MD;  Location: [facility];  Service: Gastroenterology;  Laterality: N/A;   CYSTOSCOPY WITH STENT PLACEMENT Bilateral 09/03/2018   Procedure: CYSTOSCOPY WITH STENT PLACEMENT-LIGHTED STENTS;  Surgeon: Penne Knee, MD;  Location: [facility];  Service: Urology;  Laterality: Bilateral;   LAPAROSCOPIC SIGMOID COLECTOMY N/A 09/03/2018   Procedure: LAPAROSCOPIC SIGMOID COLECTOMY;  Surgeon:  Desiderio Schanz, MD;  Location: [facility];  Service: General;  Laterality: N/A;   percutaneous drainage tube  07/2018   2nd tube placed. not healing in colon, causing a fistula   POLYPECTOMY  07/05/2023   Procedure: POLYPECTOMY;  Surgeon: Unk Corinn Skiff, MD;  Location: [facility];  Service: Gastroenterology;;   TONSILLECTOMY     TOTAL KNEE ARTHROPLASTY Left 05/10/2021   Procedure: TOTAL KNEE  ARTHROPLASTY;  Surgeon: Leora Lynwood SAUNDERS, MD;  Location: [facility];  Service: Orthopedics;  Laterality: Left;   TOTAL KNEE ARTHROPLASTY Right 01/10/2022   Procedure: TOTAL KNEE ARTHROPLASTY;  Surgeon: Leora Lynwood SAUNDERS, MD;  Location: [facility];  Service: Orthopedics;  Laterality: Right;   TUBAL LIGATION      OB History     Gravida  2   Para  2   Term  2   Preterm      AB      Living  2      SAB      IAB      Ectopic      Multiple      Live Births               Home Medications    Prior to Admission medications   Medication Sig Start Date End Date Taking? Authorizing Provider  cephALEXin  (KEFLEX ) 500 MG capsule Take 1 capsule (500 mg total) by mouth 4 (four) times daily. 03/17/24  Yes Jaz Laningham R, NP  acetaminophen  (TYLENOL ) 500 MG tablet Take 1,000 mg by mouth every 6 (six) hours as needed for mild pain or moderate pain.     [provider]  albuterol  (VENTOLIN  HFA) 108 (90 Base) MCG/ACT inhaler INHALE 2 PUFFS INTO THE LUNGS EVERY 6 HOURS AS NEEDED FOR WHEEZING OR SHORTNESS OF BREATH 02/01/24   Wellington Beams A, FNP  amLODipine  (NORVASC ) 5 MG tablet TAKE 1 TABLET(5 MG) BY MOUTH DAILY 01/19/24   Wellington Beams LABOR, FNP  blood glucose meter kit and supplies Dispense based on patient and insurance preference. Once daily. (FOR ICD-10 E10.9, E11.9). 07/12/22   Emilio Kelly DASEN, FNP  Cholecalciferol  (VITAMIN D ) 50 MCG (2000 UT) tablet Take 2,000 Units by mouth daily.    [provider]  diclofenac  Sodium (VOLTAREN ) 1 % GEL     [provider]  DULoxetine  (CYMBALTA ) 60 MG capsule TAKE 1 CAPSULE BY MOUTH TWICE DAILY 01/23/24   Wellington Beams A, FNP  ezetimibe  (ZETIA ) 10 MG tablet Take 1 tablet (10 mg total) by mouth daily. 06/13/23 01/29/24  Gollan, Timothy J, MD  fluticasone  (FLONASE ) 50 MCG/ACT nasal spray USE 2 SPRAYS IN BOTH NOSTRILS DAILY. 06/14/23   Emilio Kelly DASEN, FNP  Fluticasone -Umeclidin-Vilant (TRELEGY ELLIPTA ) 100-62.5-25 MCG/ACT AEPB  Inhale 1 puff into the lungs daily. 08/31/23   Clifton, Kellie A, FNP  gabapentin  (NEURONTIN ) 300 MG capsule Take 1 capsule (300 mg total) by mouth 2 (two) times daily. Take 2 capsules PO every morning; Take 3 capsules PO every evening. 01/02/24   Wellington Beams LABOR, FNP  glucose blood (CONTOUR NEXT TEST) test strip To check blood sugar once daily 06/09/17   Vivienne Delon HERO, PA-C  ipratropium-albuterol  (DUONEB) 0.5-2.5 (3) MG/3ML SOLN Take 3 mLs by nebulization every 6 (six) hours as needed. 08/29/22   Emilio Kelly DASEN, FNP  levothyroxine  (SYNTHROID ) 75 MCG tablet Take 1 tablet (75 mcg total) by mouth daily before breakfast. 08/29/23   Wellington Beams LABOR, FNP  loperamide  (IMODIUM  A-D) 2 MG tablet Take 2-4 mg  by mouth 4 (four) times daily as needed for diarrhea or loose stools.    [provider]  meloxicam  (MOBIC ) 15 MG tablet TAKE 1 TABLET(15 MG) BY MOUTH DAILY 02/01/24   Wellington Beams A, FNP  metFORMIN  (GLUCOPHAGE -XR) 500 MG 24 hr tablet Take 1,000 mg by mouth 2 (two) times daily. 12/15/23   [provider]  metoprolol  succinate (TOPROL -XL) 50 MG 24 hr tablet Take 1 tablet (50 mg total) by mouth daily. 12/18/23   Myrla Jon HERO, MD  montelukast  (SINGULAIR ) 10 MG tablet Take 1 tablet (10 mg total) by mouth at bedtime. 02/01/24   Clifton, Kellie A, FNP  nystatin  (MYCOSTATIN ) 100000 UNIT/ML suspension Take 5 mLs (500,000 Units total) by mouth 4 (four) times daily. 01/29/24   Gasper Nancyann BRAVO, MD  nystatin  (MYCOSTATIN /NYSTOP ) powder Apply 1 application topically 3 (three) times daily. 10/01/21   Emilio Kelly DASEN, FNP  pantoprazole  (PROTONIX ) 20 MG tablet TAKE 1 TABLET(20 MG) BY MOUTH TWICE DAILY BEFORE A MEAL 01/01/24   Wellington Beams LABOR, FNP  primidone  (MYSOLINE ) 50 MG tablet Take 0.5 tablets (25 mg total) by mouth 2 (two) times daily. 07/01/22   Laurita Pillion, MD  rosuvastatin  (CRESTOR ) 10 MG tablet TAKE 1 TABLET (10 MG TOTAL) BY MOUTH DAILY. 06/07/23   Emilio Kelly DASEN, FNP  tirzepatide  (MOUNJARO )  5 MG/0.5ML Pen Inject 5 mg into the skin once a week. 02/19/24   Gasper Nancyann BRAVO, MD  traMADol  (ULTRAM ) 50 MG tablet Take 50 mg by mouth 2 (two) times daily as needed for moderate pain. 09/29/22   [provider]    Family History Family History  Problem Relation Age of Onset   Lung cancer Mother    Alcohol abuse Sister    Arthritis Sister    Alcohol abuse Brother    Heart disease Brother 52   Heart attack Brother    Pancreatic cancer Maternal Aunt    Diabetes Maternal Aunt    Alcohol abuse Son    Drug abuse Son    Drug abuse Son    Heart disease Father 9   Diabetes Sister    Breast cancer Brother    Alcohol abuse Maternal Uncle    Alcohol abuse Maternal Grandmother    Diabetes Maternal Grandmother     Social History Social History   Tobacco Use   Smoking status: Every Day    Current packs/day: 1.50    Average packs/day: 1.9 packs/day for 45.8 years (89.4 ttl pk-yrs)    Types: Cigarettes    Start date: 03/02/1980   Smokeless tobacco: Never   Tobacco comments:    1.5PPD 05/16/2022  Vaping Use   Vaping status: Former   Devices: only for a month. did not like  Substance Use Topics   Alcohol use: No    Alcohol/week: 0.0 standard drinks of alcohol   Drug use: No     Allergies   Dexlansoprazole    Review of Systems Review of Systems   Physical Exam Triage Vital Signs ED Triage Vitals  Encounter Vitals Group     BP 03/17/24 1211 101/66     Girls Systolic BP Percentile --      Girls Diastolic BP Percentile --      Boys Systolic BP Percentile --      Boys Diastolic BP Percentile --      Pulse Rate 03/17/24 1211 80     Resp 03/17/24 1211 18     Temp 03/17/24 1211 98.2 F (36.8 C)  Temp src --      SpO2 03/17/24 1211 98 %     Weight --      Height --      Head Circumference --      Peak Flow --      Pain Score 03/17/24 1207 5     Pain Loc --      Pain Education --      Exclude from Growth Chart --    No data found.  Updated Vital  Signs BP 101/66 (BP Location: Left Arm)   Pulse 80   Temp 98.2 F (36.8 C)   Resp 18   SpO2 98%   Visual Acuity Right Eye Distance:   Left Eye Distance:   Bilateral Distance:    Right Eye Near:   Left Eye Near:    Bilateral Near:     Physical Exam Constitutional:      Appearance: Normal appearance.  Eyes:     Extraocular Movements: Extraocular movements intact.  Pulmonary:     Effort: Pulmonary effort is normal.  Skin:    Comments: Erythema and swelling present to the lateral aspect of the left great toe nailbed, Raziyah Vanvleck drainage underneath the skin, sensation intact, capillary refill less than 3, able to bear weight and complete range of motion  Neurological:     Mental Status: She is alert and oriented to person, place, and time. Mental status is at baseline.      UC Treatments / Results  Labs (all labs ordered are listed, but only abnormal results are displayed) Labs Reviewed - No data to display  EKG   Radiology No results found.  Procedures Incision and Drainage  Date/Time: 03/17/2024 1:03 PM  Performed by: Teresa Shelba SAUNDERS, NP Authorized by: Teresa Shelba SAUNDERS, NP   Consent:    Consent obtained:  Verbal   Consent given by:  Patient   Risks, benefits, and alternatives were discussed: yes     Risks discussed:  Incomplete drainage   Alternatives discussed:  No treatment Universal protocol:    Procedure explained and questions answered to patient or proxy's satisfaction: yes     Patient identity confirmed:  Verbally with patient Location:    Indications for incision and drainage: Paronychia.   Location: Left great toe. Pre-procedure details:    Skin preparation:  Chlorhexidine  Anesthesia:    Anesthesia method:  Topical application   Topical anesthesia: Lidocaine  mist. Procedure type:    Complexity:  Simple Procedure details:    Incision types:  Stab incision   Drainage:  Purulent   Drainage amount:  Moderate   Wound treatment:  Wound left open    Packing materials:  None Post-procedure details:    Procedure completion:  Tolerated  (including critical care time)  Medications Ordered in UC Medications - No data to display  Initial Impression / Assessment and Plan / UC Course  I have reviewed the triage vital signs and the nursing notes.  Pertinent labs & imaging results that were available during my care of the patient were reviewed by me and considered in my medical decision making (see chart for details).  Paronychia of the left great toe  Presentation consistent with infection, purulent drainage noted after puncture and aspirate, discussed findings, prescribed cephalexin  recommended daily cleansing and may cover with a nonadherent dressing as needed, may continue activity as tolerable advised to follow-up for persisting or worsening symptoms Final Clinical Impressions(s) / UC Diagnoses   Final diagnoses:  Paronychia of great toe, left  Discharge Instructions      Today you are evaluated for an infection to your toenail, wound has been drained here in the clinic to help reduce pressure  Take cephalexin  every 6 hours for 5 days for treatment  Cleanse over the affected area with soap and water  during normal hygiene  May cover with a nonstick Band-Aid per preference  You may continue activity if tolerable  Please follow-up if you see no improvement with your symptoms or your symptoms worsen at any point   ED Prescriptions     Medication Sig Dispense Auth. Provider   cephALEXin  (KEFLEX ) 500 MG capsule Take 1 capsule (500 mg total) by mouth 4 (four) times daily. 20 capsule Jasyn Mey R, NP      PDMP not reviewed this encounter.   Teresa Shelba SAUNDERS, NP 03/17/24 1304

## 2024-04-15 DIAGNOSIS — M25812 Other specified joint disorders, left shoulder: Secondary | ICD-10-CM | POA: Diagnosis not present

## 2024-04-15 DIAGNOSIS — M1611 Unilateral primary osteoarthritis, right hip: Secondary | ICD-10-CM | POA: Diagnosis not present

## 2024-04-15 DIAGNOSIS — M25811 Other specified joint disorders, right shoulder: Secondary | ICD-10-CM | POA: Diagnosis not present

## 2024-04-16 ENCOUNTER — Encounter: Payer: Self-pay | Admitting: Family Medicine

## 2024-04-16 ENCOUNTER — Other Ambulatory Visit: Payer: Self-pay | Admitting: Family Medicine

## 2024-04-16 ENCOUNTER — Telehealth: Payer: Self-pay | Admitting: Family Medicine

## 2024-04-16 DIAGNOSIS — J432 Centrilobular emphysema: Secondary | ICD-10-CM

## 2024-04-16 MED ORDER — ALBUTEROL SULFATE HFA 108 (90 BASE) MCG/ACT IN AERS: 2.0000 | INHALATION_SPRAY | Freq: Four times a day (QID) | RESPIRATORY_TRACT | 11 refills | Status: AC | PRN

## 2024-04-22 ENCOUNTER — Other Ambulatory Visit: Payer: Self-pay | Admitting: Family Medicine

## 2024-04-22 DIAGNOSIS — I152 Hypertension secondary to endocrine disorders: Secondary | ICD-10-CM

## 2024-04-29 ENCOUNTER — Ambulatory Visit: Admitting: Family Medicine

## 2024-05-02 ENCOUNTER — Ambulatory Visit: Admitting: Family Medicine

## 2024-05-02 ENCOUNTER — Encounter: Payer: Self-pay | Admitting: Family Medicine

## 2024-05-02 VITALS — BP 133/59 | HR 77 | Ht 69.0 in | Wt 196.8 lb

## 2024-05-02 DIAGNOSIS — F17209 Nicotine dependence, unspecified, with unspecified nicotine-induced disorders: Secondary | ICD-10-CM | POA: Diagnosis not present

## 2024-05-02 DIAGNOSIS — J432 Centrilobular emphysema: Secondary | ICD-10-CM

## 2024-05-02 DIAGNOSIS — Z716 Tobacco abuse counseling: Secondary | ICD-10-CM | POA: Diagnosis not present

## 2024-05-02 DIAGNOSIS — I10 Essential (primary) hypertension: Secondary | ICD-10-CM

## 2024-05-02 DIAGNOSIS — Z720 Tobacco use: Secondary | ICD-10-CM

## 2024-05-02 DIAGNOSIS — R300 Dysuria: Secondary | ICD-10-CM | POA: Diagnosis not present

## 2024-05-02 DIAGNOSIS — E1169 Type 2 diabetes mellitus with other specified complication: Secondary | ICD-10-CM | POA: Diagnosis not present

## 2024-05-02 DIAGNOSIS — Z1231 Encounter for screening mammogram for malignant neoplasm of breast: Secondary | ICD-10-CM

## 2024-05-02 DIAGNOSIS — I152 Hypertension secondary to endocrine disorders: Secondary | ICD-10-CM

## 2024-05-02 DIAGNOSIS — E1149 Type 2 diabetes mellitus with other diabetic neurological complication: Secondary | ICD-10-CM

## 2024-05-02 DIAGNOSIS — E1159 Type 2 diabetes mellitus with other circulatory complications: Secondary | ICD-10-CM | POA: Diagnosis not present

## 2024-05-02 DIAGNOSIS — K219 Gastro-esophageal reflux disease without esophagitis: Secondary | ICD-10-CM

## 2024-05-02 DIAGNOSIS — Z23 Encounter for immunization: Secondary | ICD-10-CM | POA: Diagnosis not present

## 2024-05-02 DIAGNOSIS — E785 Hyperlipidemia, unspecified: Secondary | ICD-10-CM

## 2024-05-02 DIAGNOSIS — E039 Hypothyroidism, unspecified: Secondary | ICD-10-CM | POA: Diagnosis not present

## 2024-05-02 DIAGNOSIS — E559 Vitamin D deficiency, unspecified: Secondary | ICD-10-CM

## 2024-05-02 LAB — POCT URINALYSIS DIPSTICK
Bilirubin, UA: NEGATIVE
Glucose, UA: NEGATIVE
Ketones, UA: NEGATIVE
Nitrite, UA: POSITIVE
Protein, UA: POSITIVE — AB
Spec Grav, UA: 1.02 (ref 1.010–1.025)
Urobilinogen, UA: 0.2 U/dL
pH, UA: 6 (ref 5.0–8.0)

## 2024-05-02 MED ORDER — TIRZEPATIDE 5 MG/0.5ML ~~LOC~~ SOAJ: 5.0000 mg | SUBCUTANEOUS | 2 refills | Status: DC

## 2024-05-02 MED ORDER — FLUCONAZOLE 150 MG PO TABS: ORAL_TABLET | ORAL | 0 refills | Status: AC

## 2024-05-02 MED ORDER — NITROFURANTOIN MONOHYD MACRO 100 MG PO CAPS: 100.0000 mg | ORAL_CAPSULE | Freq: Two times a day (BID) | ORAL | 0 refills | Status: AC

## 2024-05-02 MED ORDER — GABAPENTIN 300 MG PO CAPS: 300.0000 mg | ORAL_CAPSULE | Freq: Two times a day (BID) | ORAL | 3 refills | Status: AC

## 2024-05-02 MED ORDER — ROSUVASTATIN CALCIUM 10 MG PO TABS: 10.0000 mg | ORAL_TABLET | Freq: Every day | ORAL | 3 refills | Status: DC

## 2024-05-02 MED ORDER — PANTOPRAZOLE SODIUM 20 MG PO TBEC: DELAYED_RELEASE_TABLET | ORAL | 1 refills | Status: AC

## 2024-05-02 MED ORDER — METFORMIN HCL ER 500 MG PO TB24: 1000.0000 mg | ORAL_TABLET | Freq: Two times a day (BID) | ORAL | 1 refills | Status: DC

## 2024-05-02 NOTE — Patient Instructions (Signed)

## 2024-05-02 NOTE — Progress Notes (Unsigned)
 Established Patient Office Visit  Introduced to nurse practitioner role and practice setting.  All questions answered.  Discussed provider/patient relationship and expectations.   Subjective   Patient ID: Linda Bradford, female    DOB: Nov 09, 1955  Age: 68 y.o. MRN: [id number]  Chief Complaint  Patient presents with   Medical Management of Chronic Issues    Discussed the use of AI scribe software for clinical note transcription with the patient, who gave verbal consent to proceed.  History of Present Illness   Presents for chronic disease mgmt and concerns for UTI.   Dysuria past few days with frequency increased. Denies back pain, fevers, chills, discharge, blood.   Otherwise patient is doing well.         05/02/2024    2:56 PM 01/29/2024    3:55 PM 08/03/2023    2:00 PM  Depression screen PHQ 2/9  Decreased Interest 0 0 0  Down, Depressed, Hopeless 0 0 0  PHQ - 2 Score 0 0 0  Altered sleeping 0 0 3  Tired, decreased energy 0 1 2  Change in appetite 0 1 0  Feeling bad or failure about yourself  0 0 0  Trouble concentrating 0 0 0  Moving slowly or fidgety/restless 0 0 0  Suicidal thoughts 0 0 0  PHQ-9 Score 0 2 5  Difficult doing work/chores Not difficult at all Not difficult at all        05/02/2024    2:56 PM 01/29/2024    3:55 PM 08/03/2023    2:00 PM 07/03/2023    3:52 PM  GAD 7 : Generalized Anxiety Score  Nervous, Anxious, on Edge 0 0 0 0  Control/stop worrying 0 0 0 0  Worry too much - different things 0 0 0 0  Trouble relaxing 0 0 0 0  Restless 0 0 0 0  Easily annoyed or irritable 0 0 0 0  Afraid - awful might happen 0 0 0 0  Total GAD 7 Score 0 0 0 0  Anxiety Difficulty Not difficult at all Not difficult at all Not difficult at all Not difficult at all     ROS  Negative unless indicated in HPI   Objective:     BP (!) 133/59 (BP Location: Left Arm, Patient Position: Sitting, Cuff Size: Normal)   Pulse 77   Ht 5' 9 (1.753 m)   Wt 196 lb 12.8 oz  (89.3 kg)   SpO2 100%   BMI 29.06 kg/m    Physical Exam Constitutional:      General: She is not in acute distress.    Appearance: Normal appearance. She is not ill-appearing, toxic-appearing or diaphoretic.  HENT:     Head: Normocephalic.     Nose: Nose normal.     Mouth/Throat:     Mouth: Mucous membranes are moist.     Pharynx: Oropharynx is clear.  Eyes:     Extraocular Movements: Extraocular movements intact.     Pupils: Pupils are equal, round, and reactive to light.  Cardiovascular:     Rate and Rhythm: Normal rate and regular rhythm.     Pulses: Normal pulses.     Heart sounds: Normal heart sounds. No murmur heard.    No friction rub. No gallop.  Pulmonary:     Effort: No respiratory distress.     Breath sounds: No stridor. Wheezing present. No rhonchi or rales.  Chest:     Chest wall: No tenderness.  Abdominal:  Tenderness: There is no abdominal tenderness. There is no right CVA tenderness or left CVA tenderness.  Musculoskeletal:     Right lower leg: No edema.     Left lower leg: No edema.  Skin:    General: Skin is warm and dry.     Capillary Refill: Capillary refill takes less than 2 seconds.  Neurological:     General: No focal deficit present.     Mental Status: She is alert and oriented to person, place, and time. Mental status is at baseline.     Cranial Nerves: No cranial nerve deficit.     Motor: No weakness.     Gait: Gait normal.  Psychiatric:        Mood and Affect: Mood normal.        Behavior: Behavior normal.        Thought Content: Thought content normal.        Judgment: Judgment normal.      Results for orders placed or performed in visit on 05/02/24  Vitamin D  (25 hydroxy)  Result Value Ref Range   Vit D, 25-Hydroxy 27.1 (L) 30.0 - 100.0 ng/mL  Hemoglobin A1c  Result Value Ref Range   Hgb A1c MFr Bld 7.2 (H) 4.8 - 5.6 %   Est. average glucose Bld gHb Est-mCnc 160 mg/dL  Comprehensive metabolic panel with GFR  Result Value Ref  Range   Glucose 110 (H) 70 - 99 mg/dL   BUN 6 (L) 8 - 27 mg/dL   Creatinine, Ser 9.34 0.57 - 1.00 mg/dL   eGFR 96 >40 fO/fpw/8.26   BUN/Creatinine Ratio 9 (L) 12 - 28   Sodium 138 134 - 144 mmol/L   Potassium 5.1 3.5 - 5.2 mmol/L   Chloride 102 96 - 106 mmol/L   CO2 22 20 - 29 mmol/L   Calcium  9.7 8.7 - 10.3 mg/dL   Total Protein 6.7 6.0 - 8.5 g/dL   Albumin 4.4 3.9 - 4.9 g/dL   Globulin, Total 2.3 1.5 - 4.5 g/dL   Bilirubin Total 0.4 0.0 - 1.2 mg/dL   Alkaline Phosphatase 96 49 - 135 IU/L   AST 23 0 - 40 IU/L   ALT 20 0 - 32 IU/L  Lipid panel  Result Value Ref Range   Cholesterol, Total 121 100 - 199 mg/dL   Triglycerides 898 0 - 149 mg/dL   HDL 49 >60 mg/dL   VLDL Cholesterol Cal 19 5 - 40 mg/dL   LDL Chol Calc (NIH) 53 0 - 99 mg/dL   Chol/HDL Ratio 2.5 0.0 - 4.4 ratio  POCT urinalysis dipstick  Result Value Ref Range   Color, UA Yellow    Clarity, UA Cloudy    Glucose, UA Negative Negative   Bilirubin, UA Negative    Ketones, UA Negative    Spec Grav, UA 1.020 1.010 - 1.025   Blood, UA Trace    pH, UA 6.0 5.0 - 8.0   Protein, UA Positive (A) Negative   Urobilinogen, UA 0.2 0.2 or 1.0 E.U./dL   Nitrite, UA Positive    Leukocytes, UA Moderate (2+) (A) Negative   Appearance     Odor        The ASCVD Risk score (Arnett DK, et al., 2019) failed to calculate for the following reasons:   The valid total cholesterol range is 130 to 320 mg/dL    Assessment & Plan:  Diabetes mellitus type 2 with neurological manifestations [facility]) Assessment & Plan: Chronic,  Previously stable  UTD Urine micro Check A1c Metformin  and Mounjaro  Gabapentin  for neuropathy On statin Sees podiatry - plans to get foot exam through them  UTD eye exam  Continue to make conscious decisions for well balanced diet smaller portions with increase protein, fruits, veggies, water  as drink of choice, decrease starches, processed foods, and saturated fats. Increase weekly exercise - 150 minutes per  week.    Orders: -     Hemoglobin A1c -     Comprehensive metabolic panel with GFR -     Lipid panel -     metFORMIN  HCl ER; Take 2 tablets (1,000 mg total) by mouth 2 (two) times daily.  Dispense: 360 tablet; Refill: 1 -     Gabapentin ; Take 1 capsule (300 mg total) by mouth 2 (two) times daily. Take 2 capsules PO every morning; Take 3 capsules PO every evening.  Dispense: 60 capsule; Refill: 3 -     Tirzepatide ; Inject 5 mg into the skin once a week.  Dispense: 2 mL; Refill: 2  Hypertension associated with type 2 diabetes mellitus (HCC) Assessment & Plan: Chronic,stable - check cmp Continue metoprolol  and amlodipine    Hyperlipidemia associated with type 2 diabetes mellitus (HCC) Assessment & Plan: Chronic, stable Check lipid panel Continue Crestor   Orders: -     Rosuvastatin  Calcium ; Take 1 tablet (10 mg total) by mouth daily.  Dispense: 90 tablet; Refill: 3  Dysuria -     POCT urinalysis dipstick -     Urine Culture -     Urine Microscopic -     Nitrofurantoin  Monohyd Macro; Take 1 capsule (100 mg total) by mouth 2 (two) times daily for 7 days.  Dispense: 10 capsule; Refill: 0 -     Fluconazole ; Take one tablet today, and second tablet in 72 hours.  Dispense: 2 tablet; Refill: 0  Gastroesophageal reflux disease, unspecified whether esophagitis present -     Pantoprazole  Sodium; TAKE 1 TABLET(20 MG) BY MOUTH TWICE DAILY BEFORE A MEAL  Dispense: 180 tablet; Refill: 1  Avitaminosis D -     VITAMIN D  25 Hydroxy (Vit-D Deficiency, Fractures)  Encounter for screening mammogram for malignant neoplasm of breast -     3D Screening Mammogram, Left and Right  Immunization due -     Flu vaccine HIGH DOSE PF(Fluzone Trivalent)  Tobacco use disorder, continuous  Tobacco abuse counseling  Adult hypothyroidism Assessment & Plan: Chronic, stable Check TSH - continue levothyroxine    Centrilobular emphysema (HCC) Assessment & Plan: Chronic Some mild wheezing today, cough  clears sputum - Recommend duonebs today - tobacco cessation stressed Albuterol  prn Duonebs prn montelukast  Daily Trelegy ellipta  - recommend pt continue to follow up with pulmonary    Current tobacco use Assessment & Plan: Chronic, unchanged remains pre-contemplative regarding cessation efforts at this time Continue to monitor Followed by pulm [id number]        Assessment and Plan Assessment & Plan   Dysuria  - POC UA - positive for nitrates and leukocytes - will send for culture and urine micro w/ reflex - start Macrobid  - Ordered diflucan  as well as patient gets yeast infections with ABX.  Due for Flu vaccine - administer today  Hx of Vitamin D  Deficiency - order lab for level check  Return in about 6 months (around 10/31/2024) for Chronic Disease mgmt.    Curtis DELENA Boom, FNP

## 2024-05-03 ENCOUNTER — Encounter: Payer: Self-pay | Admitting: Family Medicine

## 2024-05-03 LAB — COMPREHENSIVE METABOLIC PANEL WITH GFR
ALT: 20 IU/L (ref 0–32)
AST: 23 IU/L (ref 0–40)
Albumin: 4.4 g/dL (ref 3.9–4.9)
Alkaline Phosphatase: 96 IU/L (ref 49–135)
BUN/Creatinine Ratio: 9 — ABNORMAL LOW (ref 12–28)
BUN: 6 mg/dL — ABNORMAL LOW (ref 8–27)
Bilirubin Total: 0.4 mg/dL (ref 0.0–1.2)
CO2: 22 mmol/L (ref 20–29)
Calcium: 9.7 mg/dL (ref 8.7–10.3)
Chloride: 102 mmol/L (ref 96–106)
Creatinine, Ser: 0.65 mg/dL (ref 0.57–1.00)
Globulin, Total: 2.3 g/dL (ref 1.5–4.5)
Glucose: 110 mg/dL — ABNORMAL HIGH (ref 70–99)
Potassium: 5.1 mmol/L (ref 3.5–5.2)
Sodium: 138 mmol/L (ref 134–144)
Total Protein: 6.7 g/dL (ref 6.0–8.5)
eGFR: 96 mL/min/1.73 (ref >59)

## 2024-05-03 LAB — URINALYSIS, MICROSCOPIC ONLY

## 2024-05-03 LAB — LIPID PANEL
Chol/HDL Ratio: 2.5 ratio (ref 0.0–4.4)
Cholesterol, Total: 121 mg/dL (ref 100–199)
HDL: 49 mg/dL (ref >39)
LDL Chol Calc (NIH): 53 mg/dL (ref 0–99)
Triglycerides: 101 mg/dL (ref 0–149)
VLDL Cholesterol Cal: 19 mg/dL (ref 5–40)

## 2024-05-03 LAB — HEMOGLOBIN A1C
Est. average glucose Bld gHb Est-mCnc: 160 mg/dL
Hgb A1c MFr Bld: 7.2 % — ABNORMAL HIGH (ref 4.8–5.6)

## 2024-05-03 LAB — VITAMIN D 25 HYDROXY (VIT D DEFICIENCY, FRACTURES): Vit D, 25-Hydroxy: 27.1 ng/mL — ABNORMAL LOW (ref 30.0–100.0)

## 2024-05-03 NOTE — Assessment & Plan Note (Signed)
 Chronic,  Previously stable UTD Urine micro Check A1c Metformin  and Mounjaro  Gabapentin  for neuropathy On statin Sees podiatry - plans to get foot exam through them  UTD eye exam  Continue to make conscious decisions for well balanced diet smaller portions with increase protein, fruits, veggies, water  as drink of choice, decrease starches, processed foods, and saturated fats. Increase weekly exercise - 150 minutes per week.

## 2024-05-03 NOTE — Assessment & Plan Note (Addendum)
 Chronic Some mild wheezing today, cough clears sputum - Recommend duonebs today - tobacco cessation stressed Albuterol  prn Duonebs prn montelukast  Daily Trelegy ellipta  - recommend pt continue to follow up with pulmonary

## 2024-05-03 NOTE — Assessment & Plan Note (Signed)
 Chronic,stable - check cmp Continue metoprolol  and amlodipine 

## 2024-05-03 NOTE — Assessment & Plan Note (Signed)
 Chronic, stable Check TSH - continue levothyroxine 

## 2024-05-03 NOTE — Assessment & Plan Note (Signed)
 Chronic, stable Check lipid panel Continue Crestor 

## 2024-05-03 NOTE — Assessment & Plan Note (Signed)
 Chronic, unchanged remains pre-contemplative regarding cessation efforts at this time Continue to monitor Followed by pulm [id number]

## 2024-05-04 ENCOUNTER — Other Ambulatory Visit: Payer: Self-pay | Admitting: Family Medicine

## 2024-05-04 DIAGNOSIS — M2550 Pain in unspecified joint: Secondary | ICD-10-CM

## 2024-05-04 LAB — URINALYSIS, MICROSCOPIC ONLY

## 2024-05-04 LAB — SPECIMEN STATUS REPORT

## 2024-05-06 ENCOUNTER — Ambulatory Visit: Payer: Self-pay | Admitting: Family Medicine

## 2024-05-08 LAB — URINE CULTURE

## 2024-05-08 LAB — SPECIMEN STATUS REPORT

## 2024-05-14 ENCOUNTER — Other Ambulatory Visit: Payer: Self-pay | Admitting: Family Medicine

## 2024-05-14 DIAGNOSIS — E1149 Type 2 diabetes mellitus with other diabetic neurological complication: Secondary | ICD-10-CM

## 2024-05-14 MED ORDER — TIRZEPATIDE 7.5 MG/0.5ML ~~LOC~~ SOAJ: 7.5000 mg | SUBCUTANEOUS | 1 refills | Status: AC

## 2024-05-14 NOTE — Telephone Encounter (Signed)
 Sent in prescription, mounjaro  7.5 mg weekly for DMII control

## 2024-05-14 NOTE — Telephone Encounter (Signed)
 I am not sure if she get's a three month supply?

## 2024-05-16 ENCOUNTER — Telehealth: Payer: Self-pay | Admitting: Family Medicine

## 2024-05-16 DIAGNOSIS — E1169 Type 2 diabetes mellitus with other specified complication: Secondary | ICD-10-CM

## 2024-05-16 MED ORDER — ROSUVASTATIN CALCIUM 10 MG PO TABS: 10.0000 mg | ORAL_TABLET | Freq: Every day | ORAL | 3 refills | Status: DC

## 2024-05-16 NOTE — Telephone Encounter (Signed)
 [facility] faxed refill request for the following medications:  rosuvastatin  (CRESTOR ) 10 MG tablet    Please advise.

## 2024-05-20 NOTE — Telephone Encounter (Signed)
 Received another fax from [facility] pharmacy for this medication.  Looks like it was sent to [facility].  Please send to correct pharmacy.

## 2024-05-21 ENCOUNTER — Other Ambulatory Visit: Payer: Self-pay

## 2024-05-21 DIAGNOSIS — E1169 Type 2 diabetes mellitus with other specified complication: Secondary | ICD-10-CM

## 2024-05-21 MED ORDER — ROSUVASTATIN CALCIUM 10 MG PO TABS: 10.0000 mg | ORAL_TABLET | Freq: Every day | ORAL | 3 refills | Status: DC

## 2024-05-21 NOTE — Telephone Encounter (Signed)
 Converted and refilled

## 2024-05-23 ENCOUNTER — Ambulatory Visit: Admitting: Student in an Organized Health Care Education/Training Program

## 2024-05-24 DIAGNOSIS — M25551 Pain in right hip: Secondary | ICD-10-CM | POA: Diagnosis not present

## 2024-05-30 DIAGNOSIS — M791 Myalgia, unspecified site: Secondary | ICD-10-CM | POA: Diagnosis not present

## 2024-05-30 DIAGNOSIS — M5136 Other intervertebral disc degeneration, lumbar region with discogenic back pain only: Secondary | ICD-10-CM | POA: Diagnosis not present

## 2024-05-30 DIAGNOSIS — Z79899 Other long term (current) drug therapy: Secondary | ICD-10-CM | POA: Diagnosis not present

## 2024-05-30 DIAGNOSIS — M47896 Other spondylosis, lumbar region: Secondary | ICD-10-CM | POA: Diagnosis not present

## 2024-05-30 DIAGNOSIS — M48061 Spinal stenosis, lumbar region without neurogenic claudication: Secondary | ICD-10-CM | POA: Diagnosis not present

## 2024-05-30 DIAGNOSIS — M542 Cervicalgia: Secondary | ICD-10-CM | POA: Diagnosis not present

## 2024-05-31 ENCOUNTER — Ambulatory Visit: Admitting: Family Medicine

## 2024-07-01 ENCOUNTER — Encounter: Payer: Self-pay | Admitting: Family Medicine

## 2024-07-01 ENCOUNTER — Ambulatory Visit: Admitting: Family Medicine

## 2024-07-01 VITALS — BP 115/64 | HR 93 | Ht 69.0 in | Wt 176.2 lb

## 2024-07-01 DIAGNOSIS — E538 Deficiency of other specified B group vitamins: Secondary | ICD-10-CM

## 2024-07-01 DIAGNOSIS — E1169 Type 2 diabetes mellitus with other specified complication: Secondary | ICD-10-CM

## 2024-07-01 DIAGNOSIS — Z9109 Other allergy status, other than to drugs and biological substances: Secondary | ICD-10-CM

## 2024-07-01 DIAGNOSIS — H6991 Unspecified Eustachian tube disorder, right ear: Secondary | ICD-10-CM

## 2024-07-01 DIAGNOSIS — E039 Hypothyroidism, unspecified: Secondary | ICD-10-CM

## 2024-07-01 DIAGNOSIS — R5383 Other fatigue: Secondary | ICD-10-CM | POA: Diagnosis not present

## 2024-07-01 MED ORDER — AMOXICILLIN 500 MG PO CAPS: 1000.0000 mg | ORAL_CAPSULE | Freq: Three times a day (TID) | ORAL | 0 refills | Status: AC

## 2024-07-01 MED ORDER — ROSUVASTATIN CALCIUM 10 MG PO TABS: 10.0000 mg | ORAL_TABLET | Freq: Every day | ORAL | 3 refills | Status: AC

## 2024-07-01 MED ORDER — FLUTICASONE PROPIONATE 50 MCG/ACT NA SUSP: 2.0000 | Freq: Every day | NASAL | 3 refills | Status: AC

## 2024-07-01 NOTE — Progress Notes (Signed)
 Established patient visit   Patient: Linda Bradford   DOB: [birth year]   68 y.o. Female  MRN: [id number] Visit Date: 07/01/2024  Today's healthcare provider: Nancyann Perry, MD   Chief Complaint  Patient presents with   Ear Fullness    Patient reports right ear feels clogged started a few months, ringing in both ears started 6x months with shooting pain, fatigue.   Subjective    Discussed the use of AI scribe software for clinical note transcription with the patient, who gave verbal consent to proceed.  History of Present Illness   Linda Bradford is a 67 year old female with COPD and diabetes who presents with right ear congestion and fatigue.  She has experienced a sensation of her right ear being clogged daily for the past three to four months, accompanied by tinnitus described as a 'real bad ringing'. The sensation began after a trip to [place] , where she noticed a knocking sound in her ear at high altitude. She has tried antihistamines and medications for runny noses without relief. Occasionally, she experiences sharp pain in the right ear, but it is not persistent. She uses Flonase  nasal spray intermittently, about a couple of days a week, and takes Allegra  for sinus issues.  She reports significant fatigue over the past two months, which started after returning from [place]  in early September. She feels exhausted after working around the house. She has a history of vitamin B12 deficiency requiring injections, though it has not been checked recently. She mentions losing 50 pounds, which she attributes to her medication regimen.  Her past medical history includes COPD, for which she experiences some shortness of breath, though it has not worsened recently. She is diabetic and has been taking Mijero, with a recent increase in dosage to 75 mg. She also takes Synthroid  and Restore, with a recent metabolic panel and sugar check in October showing good results.  No swelling in her  hands, feet, or ankles and no significant changes in her breathing or shortness of breath beyond her usual COPD symptoms.     Lab Results  Component Value Date   TSH 2.100 08/03/2023   Lab Results  Component Value Date   WBC 5.7 08/03/2023   HGB 13.8 08/03/2023   HCT 42.8 08/03/2023   MCV 92 08/03/2023   PLT 257 08/03/2023   Lab Results  Component Value Date   NA 138 05/02/2024   K 5.1 05/02/2024   CREATININE 0.65 05/02/2024   EGFR 96 05/02/2024   GLUCOSE 110 (H) 05/02/2024     Medications: Outpatient Medications Prior to Visit  Medication Sig   acetaminophen  (TYLENOL ) 500 MG tablet Take 1,000 mg by mouth every 6 (six) hours as needed for mild pain or moderate pain.    albuterol  (VENTOLIN  HFA) 108 (90 Base) MCG/ACT inhaler Inhale 2 puffs into the lungs every 6 (six) hours as needed for wheezing or shortness of breath.   amLODipine  (NORVASC ) 5 MG tablet TAKE 1 TABLET(5 MG) BY MOUTH DAILY   blood glucose meter kit and supplies Dispense based on patient and insurance preference. Once daily. (FOR ICD-10 E10.9, E11.9).   Cholecalciferol  (VITAMIN D ) 50 MCG (2000 UT) tablet Take 2,000 Units by mouth daily.   diclofenac  Sodium (VOLTAREN ) 1 % GEL    DULoxetine  (CYMBALTA ) 60 MG capsule TAKE 1 CAPSULE BY MOUTH TWICE DAILY   ezetimibe  (ZETIA ) 10 MG tablet Take 1 tablet (10 mg total) by mouth daily.   Fluticasone -Umeclidin-Vilant (  TRELEGY ELLIPTA ) 100-62.5-25 MCG/ACT AEPB Inhale 1 puff into the lungs daily.   gabapentin  (NEURONTIN ) 300 MG capsule Take 1 capsule (300 mg total) by mouth 2 (two) times daily. Take 2 capsules PO every morning; Take 3 capsules PO every evening.   glucose blood (CONTOUR NEXT TEST) test strip To check blood sugar once daily   ipratropium-albuterol  (DUONEB) 0.5-2.5 (3) MG/3ML SOLN Take 3 mLs by nebulization every 6 (six) hours as needed.   levothyroxine  (SYNTHROID ) 75 MCG tablet Take 1 tablet (75 mcg total) by mouth daily before breakfast.   loperamide  (IMODIUM   A-D) 2 MG tablet Take 2-4 mg by mouth 4 (four) times daily as needed for diarrhea or loose stools.   meloxicam  (MOBIC ) 15 MG tablet TAKE 1 TABLET(15 MG) BY MOUTH DAILY   metFORMIN  (GLUCOPHAGE -XR) 500 MG 24 hr tablet Take 2 tablets (1,000 mg total) by mouth 2 (two) times daily.   metoprolol  succinate (TOPROL -XL) 50 MG 24 hr tablet Take 1 tablet (50 mg total) by mouth daily.   montelukast  (SINGULAIR ) 10 MG tablet Take 1 tablet (10 mg total) by mouth at bedtime.   nystatin  (MYCOSTATIN ) 100000 UNIT/ML suspension Take 5 mLs (500,000 Units total) by mouth 4 (four) times daily.   pantoprazole  (PROTONIX ) 20 MG tablet TAKE 1 TABLET(20 MG) BY MOUTH TWICE DAILY BEFORE A MEAL   primidone  (MYSOLINE ) 50 MG tablet Take 0.5 tablets (25 mg total) by mouth 2 (two) times daily.   tirzepatide  (MOUNJARO ) 7.5 MG/0.5ML Pen Inject 7.5 mg into the skin once a week.   traMADol  (ULTRAM ) 50 MG tablet Take 50 mg by mouth 2 (two) times daily as needed for moderate pain.   fluticasone  (FLONASE ) 50 MCG/ACT nasal spray USE 2 SPRAYS IN BOTH NOSTRILS DAILY.   rosuvastatin  (CRESTOR ) 10 MG tablet Take 1 tablet (10 mg total) by mouth daily.   nystatin  (MYCOSTATIN /NYSTOP ) powder Apply 1 application topically 3 (three) times daily.   No facility-administered medications prior to visit.        Objective    BP 115/64 (BP Location: Left Arm, Patient Position: Sitting, Cuff Size: Normal)   Pulse 93   Ht 5' 9 (1.753 m)   Wt 176 lb 3.2 oz (79.9 kg)   SpO2 99%   BMI 26.02 kg/m   Physical Exam   General Appearance:    Well developed, well nourished female, alert, cooperative, in no acute distress  HENT:   left TM normal without fluid or infection, right TM fluid noted, neck without nodes, throat normal without erythema or exudate, sinuses nontender, and nasal mucosa pale and congested  Eyes:    PERRL, conjunctiva/corneas clear, EOM's intact       Lungs:     Clear to auscultation bilaterally, respirations unlabored  Heart:     Normal heart rate. Normal rhythm. No murmurs, rubs, or gallops.    Neurologic:   Awake, alert, oriented x 3. No apparent focal neurological           defect.       Assessment & Plan       Eustachian tube dysfunction, right ear Chronic dysfunction likely due to allergy-related congestion. Possible bacterial colonization. - Prescribed amoxicillin  for 7 days. - Advised daily Flonase  nasal spray. - Consider ENT referral if no improvement in a week.  Fatigue Potentially related to rapid weight loss and vitamin deficiencies. Differential includes thyroid  dysfunction and vitamin B12 deficiency. - Ordered lab tests for vitamin B12 and thyroid  levels.  Type 2 diabetes mellitus 50-pound weight since  starting mounjaro .  Lab Results  Component Value Date   HGBA1C 7.2 (H) 05/02/2024   Hypothyroidism Dose adjustment may be needed. - Ordered thyroid  function tests before refilling Synthroid .  Vitamin B12 deficiency Previous injections required. Current fatigue may be related. - Ordered lab test for vitamin B12 levels.  Allergic rhinitis Intermittent congestion managed with Allegra  and Flonase . May contribute to Eustachian tube dysfunction. - Advised daily Flonase  nasal spray.   Call for ENT referral if not much better next week.         Nancyann Perry, MD  [facility] [phone] (phone) [phone] (fax)  [facility]

## 2024-07-02 ENCOUNTER — Other Ambulatory Visit: Payer: Self-pay

## 2024-07-02 ENCOUNTER — Telehealth: Payer: Self-pay

## 2024-07-02 ENCOUNTER — Ambulatory Visit: Payer: Self-pay | Admitting: Family Medicine

## 2024-07-02 DIAGNOSIS — E039 Hypothyroidism, unspecified: Secondary | ICD-10-CM

## 2024-07-02 LAB — VITAMIN B12: Vitamin B-12: 464 pg/mL (ref 232–1245)

## 2024-07-02 LAB — TSH+FREE T4
Free T4: 1.26 ng/dL (ref 0.82–1.77)
TSH: 2.92 u[IU]/mL (ref 0.450–4.500)

## 2024-07-02 LAB — CBC
Hematocrit: 51.3 % — ABNORMAL HIGH (ref 34.0–46.6)
Hemoglobin: 16.8 g/dL — ABNORMAL HIGH (ref 11.1–15.9)
MCH: 31.2 pg (ref 26.6–33.0)
MCHC: 32.7 g/dL (ref 31.5–35.7)
MCV: 95 fL (ref 79–97)
Platelets: 304 x10E3/uL (ref 150–450)
RBC: 5.38 x10E6/uL — ABNORMAL HIGH (ref 3.77–5.28)
RDW: 13.9 % (ref 11.7–15.4)
WBC: 6 x10E3/uL (ref 3.4–10.8)

## 2024-07-02 MED ORDER — LEVOTHYROXINE SODIUM 75 MCG PO TABS: 75.0000 ug | ORAL_TABLET | Freq: Every day | ORAL | 3 refills | Status: DC

## 2024-07-02 MED ORDER — LEVOTHYROXINE SODIUM 75 MCG PO TABS: 75.0000 ug | ORAL_TABLET | Freq: Every day | ORAL | 3 refills | Status: AC

## 2024-07-02 NOTE — Telephone Encounter (Signed)
 LVM letting patient know that the rx was sent to preferred pharmacy.

## 2024-07-02 NOTE — Telephone Encounter (Signed)
 Copied from CRM #[id number]. Topic: Clinical - Prescription Issue >> Jul 02, 2024 12:48 PM Tinnie C wrote: Reason for CRM: Pts synthroid  was refilled via [facility], but patient would like it sent to [id number] instead. She says the [facility] takes too long and she would not get it in time. She has 3 days left. I removed this pharmacy from her chart as requested.   [id number] [address], [place] - [address] AT [facility] OF [address] [place] [address] Phone: [phone] Fax: [phone]  Please call pt once complete at [phone].

## 2024-07-04 ENCOUNTER — Encounter: Payer: Self-pay | Admitting: Medical

## 2024-07-04 ENCOUNTER — Ambulatory Visit: Attending: Medical | Admitting: Medical

## 2024-07-04 VITALS — BP 110/60 | HR 74 | Ht 69.0 in | Wt 173.6 lb

## 2024-07-04 DIAGNOSIS — R0602 Shortness of breath: Secondary | ICD-10-CM

## 2024-07-04 DIAGNOSIS — J432 Centrilobular emphysema: Secondary | ICD-10-CM

## 2024-07-04 DIAGNOSIS — I700 Atherosclerosis of aorta: Secondary | ICD-10-CM

## 2024-07-04 DIAGNOSIS — R079 Chest pain, unspecified: Secondary | ICD-10-CM | POA: Diagnosis not present

## 2024-07-04 DIAGNOSIS — R5383 Other fatigue: Secondary | ICD-10-CM

## 2024-07-04 DIAGNOSIS — R0989 Other specified symptoms and signs involving the circulatory and respiratory systems: Secondary | ICD-10-CM | POA: Diagnosis not present

## 2024-07-04 DIAGNOSIS — E782 Mixed hyperlipidemia: Secondary | ICD-10-CM | POA: Diagnosis not present

## 2024-07-04 DIAGNOSIS — Z79899 Other long term (current) drug therapy: Secondary | ICD-10-CM

## 2024-07-04 DIAGNOSIS — Z720 Tobacco use: Secondary | ICD-10-CM

## 2024-07-04 NOTE — Progress Notes (Signed)
 Cardiology Office Note   Date:  07/04/2024  ID:  LIVI MCGANN, DOB [birth year], MRN [id number] PCP: Wellington Curtis LABOR, FNP (Inactive)  [facility] Providers Cardiologist:  None   History of Present Illness Linda Bradford is a 68 y.o. female with a history of diabetes, hypertension, COPD, smoker, aortic valve calcification, aortic atherosclerosis, CAC who presents for 1 year follow-up.   Echo in 2018 showed normal LVEF, mild to mod LVH, mild AI.  The patient was seen 06/2023 for aortic valve calcification and aortic atherosclerosis. She reported stress at home after husband passed away. Zetia  was added to Crestor .    The patient reports chronic fatigue over the last year. TSH was normal. She has occasional sharp chest pain. She hs chronic SOB that is a little worse. She has dizziness and lightheaded from her ear infection.    Studies Reviewed EKG Interpretation Date/Time:  Thursday July 04 2024 15:42:21 EST Ventricular Rate:  74 PR Interval:  168 QRS Duration:  80 QT Interval:  390 QTC Calculation: 432 R Axis:   25  Text Interpretation: Normal sinus rhythm Normal ECG When compared with ECG of 13-Jun-2023 16:07, No significant change was found Confirmed by Franchester, Olita Takeshita ([id number]) on 07/04/2024 4:00:12 PM    Echo 2018 Study Conclusions   - Left ventricle: The cavity size was normal. Wall thickness was    increased increased in a pattern of mild to moderate LVH.    Systolic function was vigorous. The estimated ejection fraction    was in the range of 65% to 70%. Doppler parameters are consistent    with abnormal left ventricular relaxation (grade 1 diastolic    dysfunction).  - Aortic valve: There was mild regurgitation.  - Right ventricle: The cavity size was normal. Wall thickness was    mildly increased. Systolic function was normal.   MPI 11/2016 Narrative & Impression  Pharmacological myocardial perfusion imaging study with no significant  ischemia Normal wall  motion, EF estimated at 72% No EKG changes concerning for ischemia at peak stress or in recovery. Low risk scan   Physical Exam VS:  BP 110/60 (BP Location: Left Arm, Patient Position: Sitting, Cuff Size: Large)   Pulse 74   Ht 5' 9 (1.753 m)   Wt 173 lb 9.6 oz (78.7 kg)   SpO2 94%   BMI 25.64 kg/m        Wt Readings from Last 3 Encounters:  07/04/24 173 lb 9.6 oz (78.7 kg)  07/01/24 176 lb 3.2 oz (79.9 kg)  05/02/24 196 lb 12.8 oz (89.3 kg)    GEN: Well nourished, well developed in no acute distress NECK: No JVD; No carotid bruits CARDIAC: RRR, + murmur, no rubs, gallops RESPIRATORY:  Clear to auscultation without rales, wheezing or rhonchi  ABDOMEN: Soft, non-tender, non-distended EXTREMITIES:  No edema; No deformity   ASSESSMENT AND PLAN  Fatigue, DOE and chest pain CAC Patient reports a couple months of fatigue, DOE and sharp chest pain. Recent labs by PCP were unremarkable. Chest CT shows CAC. I will order an echocardiogram and a cardiac PET stress test. I will update a mag level.   Aortic atherosclerosis Hyperlipidemia LDL 53. Continue Crestor  10mg  daily and Zetia  10mg  daily.   Tobacco use She is smoking 2 ppd. Complete cessation recommended.   Aortic valve calcification I will update an echocardiogram as above.   Carotid bruit on the right Carotid US  showed 1-39% on the right and no stenosis on the left. Continue statin  and Zetia .      Informed Consent   Shared Decision Making/Informed Consent The risks [chest pain, shortness of breath, cardiac arrhythmias, dizziness, blood pressure fluctuations, myocardial infarction, stroke/transient ischemic attack, nausea, vomiting, allergic reaction, radiation exposure, metallic taste sensation and life-threatening complications (estimated to be 1 in 10,000)], benefits (risk stratification, diagnosing coronary artery disease, treatment guidance) and alternatives of a cardiac PET stress test were discussed in detail with  Linda Bradford and she agrees to proceed.     Dispo: Follow-up in 1-2 months  Signed, Elizabethanne Lusher VEAR Fishman, PA-C

## 2024-07-04 NOTE — Patient Instructions (Signed)
 Medication Instructions:  Your physician recommends that you continue on your current medications as directed. Please refer to the Current Medication list given to you today.   *If you need a refill on your cardiac medications before your next appointment, please call your pharmacy*  Lab Work: Your provider would like for you to have following labs drawn today Mag.   If you have labs (blood work) drawn today and your tests are completely normal, you will receive your results only by: MyChart Message (if you have MyChart) OR A paper copy in the mail If you have any lab test that is abnormal or we need to change your treatment, we will call you to review the results.  Testing/Procedures: Your physician has requested that you have an echocardiogram. Echocardiography is a painless test that uses sound waves to create images of your heart. It provides your doctor with information about the size and shape of your heart and how well your heart's chambers and valves are working.   You may receive an ultrasound enhancing agent through an IV if needed to better visualize your heart during the echo. This procedure takes approximately one hour.  There are no restrictions for this procedure.  This will take place at [address] [facility]) [address], [place] [address]  Please note: We ask at that you not bring children with you during ultrasound (echo/ vascular) testing. Due to room size and safety concerns, children are not allowed in the ultrasound rooms during exams. Our front office staff cannot provide observation of children in our lobby area while testing is being conducted. An adult accompanying a patient to their appointment will only be allowed in the ultrasound room at the discretion of the ultrasound technician under special circumstances. We apologize for any inconvenience.     Please report to Radiology at the [facility] Main Entrance 30 minutes early for your  test.  [address] [place], [place] [address]                         OR   Please report to Radiology at [facility] Main Entrance, medical mall, 30 mins prior to your test.  [address]  [place], [place]  How to Prepare for Your Cardiac PET/CT Stress Test:  Nothing to eat or drink, except water , 3 hours prior to arrival time.  NO caffeine /decaffeinated products, or chocolate 12 hours prior to arrival. (Please note decaffeinated beverages (teas/coffees) still contain caffeine ).  If you have caffeine  within 12 hours prior, the test will need to be rescheduled.  Medication instructions: Do not take erectile dysfunction medications for 72 hours prior to test (sildenafil, tadalafil) Do not take nitrates (isosorbide mononitrate, Ranexa) the day before or day of test Do not take tamsulosin the day before or morning of test Hold theophylline  containing medications for 12 hours. Hold Dipyridamole 48 hours prior to the test.  Diabetic Preparation: If able to eat breakfast prior to 3 hour fasting, you may take all medications, including your insulin . Do not worry if you miss your breakfast dose of insulin  - start at your next meal. If you do not eat prior to 3 hour fast-Hold all diabetes (oral and insulin ) medications. Patients who wear a continuous glucose monitor MUST remove the device prior to scanning.  You may take your remaining medications with water .  NO perfume, cologne or lotion on chest or abdomen area. FEMALES - Please avoid  wearing dresses to this appointment.  Total time is 1 to 2 hours; you may want to bring reading material for the waiting time.  IF YOU THINK YOU MAY BE PREGNANT, OR ARE NURSING PLEASE INFORM THE TECHNOLOGIST.  In preparation for your appointment, medication and supplies will be purchased.  Appointment availability is limited, so if you need to cancel or reschedule, please call the Radiology Department Scheduler at  [phone] 24 hours in advance to avoid a cancellation fee of $100.00  What to Expect When you Arrive:  Once you arrive and check in for your appointment, you will be taken to a preparation room within the Radiology Department.  A technologist or Nurse will obtain your medical history, verify that you are correctly prepped for the exam, and explain the procedure.  Afterwards, an IV will be started in your arm and electrodes will be placed on your skin for EKG monitoring during the stress portion of the exam. Then you will be escorted to the PET/CT scanner.  There, staff will get you positioned on the scanner and obtain a blood pressure and EKG.  During the exam, you will continue to be connected to the EKG and blood pressure machines.  A small, safe amount of a radioactive tracer will be injected in your IV to obtain a series of pictures of your heart along with an injection of a stress agent.    After your Exam:  It is recommended that you eat a meal and drink a caffeinated beverage to counter act any effects of the stress agent.  Drink plenty of fluids for the remainder of the day and urinate frequently for the first couple of hours after the exam.  Your doctor will inform you of your test results within 7-10 business days.  For more information and frequently asked questions, please visit our website: [email or web address]  For questions about your test or how to prepare for your test, please call: Cardiac Imaging Nurse Navigators Office: [phone]   Follow-Up: At [facility], you and your health needs are our priority.  As part of our continuing mission to provide you with exceptional heart care, our providers are all part of one team.  This team includes your primary Cardiologist (physician) and Advanced Practice Providers or APPs (Physician Assistants and Nurse Practitioners) who all work together to provide you with the care you need, when you need it.  Your  next appointment:   1-2 month(s)  Provider:   You may see Cadence Franchester, PA-C

## 2024-07-05 ENCOUNTER — Ambulatory Visit: Payer: Self-pay | Admitting: Medical

## 2024-07-05 LAB — MAGNESIUM: Magnesium: 1.7 mg/dL (ref 1.6–2.3)

## 2024-07-12 ENCOUNTER — Telehealth: Payer: Self-pay

## 2024-07-12 NOTE — Progress Notes (Signed)
 Pharmacy Quality Measure Review  This patient is appearing on a report for being at risk of failing the adherence measure for cholesterol (statin) medications this calendar year.   Medication: rosuvastatin  Last fill date: 07/01/24 for 90 day supply  Insurance report was not up to date. No action needed at this time.   Danajah Birdsell E. Marsh, PharmD, CPP Clinical Pharmacist [facility] [phone]

## 2024-07-17 ENCOUNTER — Other Ambulatory Visit: Payer: Self-pay | Admitting: Cardiovascular Disease

## 2024-07-29 ENCOUNTER — Telehealth: Payer: Self-pay | Admitting: Family Medicine

## 2024-07-29 DIAGNOSIS — I152 Hypertension secondary to endocrine disorders: Secondary | ICD-10-CM

## 2024-07-29 DIAGNOSIS — F32A Depression, unspecified: Secondary | ICD-10-CM

## 2024-07-29 MED ORDER — DULOXETINE HCL 60 MG PO CPEP: 60.0000 mg | ORAL_CAPSULE | Freq: Two times a day (BID) | ORAL | 1 refills | Status: AC

## 2024-07-29 MED ORDER — AMLODIPINE BESYLATE 5 MG PO TABS: ORAL_TABLET | ORAL | 0 refills | Status: DC

## 2024-07-29 NOTE — Telephone Encounter (Addendum)
 [facility] faxed refill request for the following medications:  DULoxetine  (CYMBALTA ) 60 MG capsule   amLODipine  (NORVASC ) 5 MG tablet   Please advise.

## 2024-07-29 NOTE — Telephone Encounter (Signed)
 Converted and refilled

## 2024-08-06 ENCOUNTER — Encounter (HOSPITAL_COMMUNITY): Payer: Self-pay

## 2024-08-08 ENCOUNTER — Ambulatory Visit
Admission: RE | Admit: 2024-08-08 | Discharge: 2024-08-08 | Disposition: A | Source: Ambulatory Visit | Attending: Medical

## 2024-08-08 DIAGNOSIS — R079 Chest pain, unspecified: Secondary | ICD-10-CM | POA: Insufficient documentation

## 2024-08-08 DIAGNOSIS — Z79899 Other long term (current) drug therapy: Secondary | ICD-10-CM

## 2024-08-08 DIAGNOSIS — I700 Atherosclerosis of aorta: Secondary | ICD-10-CM | POA: Diagnosis not present

## 2024-08-08 DIAGNOSIS — R0602 Shortness of breath: Secondary | ICD-10-CM | POA: Insufficient documentation

## 2024-08-08 MED ORDER — RUBIDIUM RB82 GENERATOR (RUBYFILL)
25.0000 | PACK | Freq: Once | INTRAVENOUS | Status: AC
  Administered 2024-08-08: 19.13 via INTRAVENOUS

## 2024-08-08 MED ORDER — REGADENOSON 0.4 MG/5ML IV SOLN
INTRAVENOUS | Status: AC
  Filled 2024-08-08: qty 5

## 2024-08-08 MED ORDER — RUBIDIUM RB82 GENERATOR (RUBYFILL)
25.0000 | PACK | Freq: Once | INTRAVENOUS | Status: AC
  Administered 2024-08-08: 19.17 via INTRAVENOUS

## 2024-08-08 MED ORDER — REGADENOSON 0.4 MG/5ML IV SOLN
0.4000 mg | Freq: Once | INTRAVENOUS | Status: AC
  Administered 2024-08-08: 0.4 mg via INTRAVENOUS

## 2024-08-09 ENCOUNTER — Ambulatory Visit: Attending: Medical

## 2024-08-09 DIAGNOSIS — I5030 Unspecified diastolic (congestive) heart failure: Secondary | ICD-10-CM

## 2024-08-09 DIAGNOSIS — R079 Chest pain, unspecified: Secondary | ICD-10-CM | POA: Diagnosis not present

## 2024-08-09 DIAGNOSIS — Z79899 Other long term (current) drug therapy: Secondary | ICD-10-CM

## 2024-08-09 DIAGNOSIS — I517 Cardiomegaly: Secondary | ICD-10-CM

## 2024-08-09 DIAGNOSIS — R0602 Shortness of breath: Secondary | ICD-10-CM | POA: Diagnosis not present

## 2024-08-09 DIAGNOSIS — I700 Atherosclerosis of aorta: Secondary | ICD-10-CM

## 2024-08-09 DIAGNOSIS — I080 Rheumatic disorders of both mitral and aortic valves: Secondary | ICD-10-CM

## 2024-08-09 LAB — ECHOCARDIOGRAM COMPLETE
AR max vel: 1.88 cm2
AV Area VTI: 2.13 cm2
AV Area mean vel: 1.94 cm2
AV Mean grad: 6 mmHg
AV Peak grad: 10.5 mmHg
Ao pk vel: 1.62 m/s
Area-P 1/2: 3.3 cm2
S' Lateral: 3.3 cm

## 2024-08-11 LAB — NM PET CT CARDIAC PERFUSION MULTI W/ABSOLUTE BLOODFLOW
MBFR: 3.1
Nuc Rest EF: 61 %
Nuc Stress EF: 65 %
Peak HR: 95 {beats}/min
Rest HR: 75 {beats}/min
Rest MBF: 0.9 ml/g/min
Rest Nuclear Isotope Dose: 19.1 mCi
SRS: 0
SSS: 0
ST Depression (mm): 0 mm
Stress MBF: 2.79 ml/g/min
Stress Nuclear Isotope Dose: 19.2 mCi
TID: 1.05

## 2024-08-12 ENCOUNTER — Telehealth: Payer: Self-pay | Admitting: Family Medicine

## 2024-08-12 ENCOUNTER — Other Ambulatory Visit: Payer: Self-pay

## 2024-08-12 DIAGNOSIS — M2550 Pain in unspecified joint: Secondary | ICD-10-CM

## 2024-08-12 MED ORDER — MELOXICAM 15 MG PO TABS: 15.0000 mg | ORAL_TABLET | Freq: Every day | ORAL | 0 refills | Status: DC | PRN

## 2024-08-12 NOTE — Telephone Encounter (Signed)
 LOV 07/01/24 NOV 10/31/24 LRF 05/05/24 90 x 0

## 2024-08-12 NOTE — Telephone Encounter (Signed)
 [facility] refill request for the following medications:  meloxicam (MOBIC) 15 MG tablet   Please advise.

## 2024-08-12 NOTE — Telephone Encounter (Signed)
converted

## 2024-08-13 ENCOUNTER — Encounter: Payer: Self-pay | Admitting: Family Medicine

## 2024-08-13 ENCOUNTER — Other Ambulatory Visit: Payer: Self-pay | Admitting: Family Medicine

## 2024-08-13 DIAGNOSIS — D751 Secondary polycythemia: Secondary | ICD-10-CM

## 2024-08-16 ENCOUNTER — Ambulatory Visit

## 2024-08-16 VITALS — BP 111/62 | HR 90 | Temp 97.5°F | Wt 158.8 lb

## 2024-08-16 DIAGNOSIS — E1159 Type 2 diabetes mellitus with other circulatory complications: Secondary | ICD-10-CM | POA: Diagnosis not present

## 2024-08-16 DIAGNOSIS — E785 Hyperlipidemia, unspecified: Secondary | ICD-10-CM

## 2024-08-16 DIAGNOSIS — I152 Hypertension secondary to endocrine disorders: Secondary | ICD-10-CM | POA: Diagnosis not present

## 2024-08-16 DIAGNOSIS — H6990 Unspecified Eustachian tube disorder, unspecified ear: Secondary | ICD-10-CM

## 2024-08-16 DIAGNOSIS — E1169 Type 2 diabetes mellitus with other specified complication: Secondary | ICD-10-CM | POA: Diagnosis not present

## 2024-08-16 DIAGNOSIS — J432 Centrilobular emphysema: Secondary | ICD-10-CM

## 2024-08-16 DIAGNOSIS — F331 Major depressive disorder, recurrent, moderate: Secondary | ICD-10-CM | POA: Diagnosis not present

## 2024-08-16 DIAGNOSIS — Z7984 Long term (current) use of oral hypoglycemic drugs: Secondary | ICD-10-CM | POA: Diagnosis not present

## 2024-08-16 DIAGNOSIS — M2550 Pain in unspecified joint: Secondary | ICD-10-CM | POA: Diagnosis not present

## 2024-08-16 DIAGNOSIS — E1149 Type 2 diabetes mellitus with other diabetic neurological complication: Secondary | ICD-10-CM | POA: Diagnosis not present

## 2024-08-16 DIAGNOSIS — M51379 Other intervertebral disc degeneration, lumbosacral region without mention of lumbar back pain or lower extremity pain: Secondary | ICD-10-CM | POA: Diagnosis not present

## 2024-08-16 DIAGNOSIS — F5101 Primary insomnia: Secondary | ICD-10-CM

## 2024-08-16 LAB — POCT GLYCOSYLATED HEMOGLOBIN (HGB A1C): Hemoglobin A1C: 5.4 % (ref 4.0–5.6)

## 2024-08-16 MED ORDER — MELOXICAM 15 MG PO TABS: 15.0000 mg | ORAL_TABLET | Freq: Every day | ORAL | 3 refills | Status: AC

## 2024-08-16 MED ORDER — AMLODIPINE BESYLATE 5 MG PO TABS: ORAL_TABLET | ORAL | 3 refills | Status: DC

## 2024-08-16 MED ORDER — METFORMIN HCL ER 500 MG PO TB24: 1000.0000 mg | ORAL_TABLET | Freq: Every day | ORAL | 3 refills | Status: AC

## 2024-08-16 MED ORDER — HYDROXYZINE HCL 10 MG PO TABS: 10.0000 mg | ORAL_TABLET | Freq: Every evening | ORAL | 3 refills | Status: AC

## 2024-08-16 NOTE — Progress Notes (Unsigned)
 "     Established patient visit   Patient: Linda Bradford   DOB: [birth year]   69 y.o. Female  MRN: [id number] Visit Date: 08/16/2024  Today's healthcare provider: Isaiah DELENA Pepper, MD   Chief Complaint  Patient presents with   Diabetes   Referral    ENT   Depression   Subjective    HPI  Discussed the use of AI scribe software for clinical note transcription with the patient, who gave verbal consent to proceed.  History of Present Illness Linda Bradford is a 69 year old female with type 2 diabetes who presents for follow-up of her diabetes management and medication review.  She has experienced significant improvement in her diabetes management, with her A1c decreasing from 7.2% to 5.4% since starting Mounjaro . She takes metformin  every other day due to severe diarrhea, taking two 500 mg tablets in the morning and two at night when she does take it. She experiences nausea after meals when taking Mounjaro , which she administers once a week.  She experiences chronic back pain and joint pain, for which she takes meloxicam  once daily and tramadol  twice daily. She has a history of bilateral knee replacements and notes that cold weather exacerbates her joint pain. The patient reports that losing weight has helped her back pain, but she still experiences back pain and has been told she has degenerative changes.  She reports symptoms of depression, loss of interest in activities, and poor sleep since her husband's death two years ago. She previously took trazodone  for sleep but discontinued it due to interactions with her essential tremor medication, primidone . She has been on Cymbalta  for a long time and questions its current efficacy.  She experiences ear symptoms, including popping, a sensation of being in a vacuum, and ringing, which have not improved with antibiotics or nasal spray. She has a history of ear issues in childhood, including tubes, adenoidectomy, and tonsillectomy.   Wt  Readings from Last 3 Encounters:  08/16/24 158 lb 12.8 oz (72 kg)  08/08/24 163 lb (73.9 kg)  07/04/24 173 lb 9.6 oz (78.7 kg)     Medications: Show/hide medication list[1]  Review of Systems as noted in HPI.      Objective    BP 111/62   Pulse 90   Temp (!) 97.5 F (36.4 C) (Oral)   Wt 158 lb 12.8 oz (72 kg)   SpO2 98%   BMI 23.45 kg/m    Physical Exam Constitutional:      Appearance: Normal appearance.  HENT:     Head: Normocephalic and atraumatic.     Mouth/Throat:     Mouth: Mucous membranes are moist.  Eyes:     Pupils: Pupils are equal, round, and reactive to light.  Pulmonary:     Effort: Pulmonary effort is normal.  Skin:    General: Skin is warm.  Neurological:     General: No focal deficit present.     Mental Status: She is alert.      Results for orders placed or performed in visit on 08/16/24  POCT HgB A1C  Result Value Ref Range   Hemoglobin A1C 5.4 4.0 - 5.6 %   HbA1c POC (<> result, manual entry)     HbA1c, POC (prediabetic range)     HbA1c, POC (controlled diabetic range)      Assessment & Plan     Problem List Items Addressed This Visit       Cardiovascular and Mediastinum  Hypertension associated with type 2 diabetes mellitus (HCC)   Relevant Medications   metFORMIN  (GLUCOPHAGE -XR) 500 MG 24 hr tablet   amLODipine  (NORVASC ) 5 MG tablet     Respiratory   Centrilobular emphysema (HCC)   Relevant Orders   Ambulatory Referral for Lung Cancer Scre     Endocrine   Hyperlipidemia associated with type 2 diabetes mellitus (HCC)   Relevant Medications   metFORMIN  (GLUCOPHAGE -XR) 500 MG 24 hr tablet   amLODipine  (NORVASC ) 5 MG tablet   Other Relevant Orders   POCT HgB A1C (Completed)   Diabetes mellitus type 2 with neurological manifestations (HCC) - Primary   Relevant Medications   metFORMIN  (GLUCOPHAGE -XR) 500 MG 24 hr tablet   Other Relevant Orders   Microalbumin / creatinine urine ratio     Nervous and Auditory    Dysfunction of eustachian tube   Relevant Orders   Ambulatory referral to ENT     Musculoskeletal and Integument   DDD (degenerative disc disease), lumbosacral     Other   Depression, major, recurrent, moderate (HCC)   Relevant Medications   hydrOXYzine  (ATARAX ) 10 MG tablet   Primary insomnia   Relevant Medications   hydrOXYzine  (ATARAX ) 10 MG tablet   Arthralgia   Relevant Medications   meloxicam  (MOBIC ) 15 MG tablet    Assessment & Plan Type 2 diabetes mellitus (HCC) Well-controlled with A1c of 5.4. Improvement since starting Mounjaro . Metformin  causes diarrhea at high doses. - Adjusted metformin  to 1000 mg daily in the morning. - Continue Mounjaro  7.5 mg weekly.  Hyperlipidemia associated with type 2 diabetes (HCC) Chronic, controlled. Continue Crestor  and Zetia .  Hypertension associated with diabetes (HCC) Chronic, controlled. Continue amlodipine , metoprolol .  Degenerative Disc Disease Arthralgia Managed with meloxicam  and tramadol . Weight loss alleviated some symptoms, but pain persists. - Continue meloxicam  as needed. - Continue tramadol , follows with pain management  Primary insomnia Difficulty falling asleep. Previous trazodone  discontinued due to interaction with primidone . Atarax  considered for sleep and anxiety. - Prescribed Atarax  at night as needed. - Monitor for daytime grogginess and adjust dose if necessary.  Centrilobular emphysema (HCC) Controlled with Trelegy. Recommend repeat lung cancer screening. Referral placed to pulmonology.  Depression, major, recurrent, moderate (HCC) Symptoms of anhedonia and fatigue. Current treatment with Cymbalta  may not be fully effective. Atarax  may improve mood with better sleep. - Monitor mood and sleep patterns with Atarax  treatment. - Consider additional antidepressant if no improvement with Atarax .  Dysfunction of Eustachian tube with tinnitus Chronic. Symptoms of ear popping and tinnitus. - Referred to ENT  for further evaluation and management.    Return in about 4 weeks (around 09/13/2024) for Follow Up.       Isaiah DELENA Pepper, MD  [facility] [phone] (phone) [phone] (fax)     [1]  Outpatient Medications Prior to Visit  Medication Sig   acetaminophen  (TYLENOL ) 500 MG tablet Take 1,000 mg by mouth every 6 (six) hours as needed for mild pain or moderate pain.    albuterol  (VENTOLIN  HFA) 108 (90 Base) MCG/ACT inhaler Inhale 2 puffs into the lungs every 6 (six) hours as needed for wheezing or shortness of breath.   diclofenac  Sodium (VOLTAREN ) 1 % GEL    DULoxetine  (CYMBALTA ) 60 MG capsule Take 1 capsule (60 mg total) by mouth 2 (two) times daily.   ezetimibe  (ZETIA ) 10 MG tablet TAKE 1 TABLET(10 MG) BY MOUTH DAILY   fluticasone  (FLONASE ) 50 MCG/ACT nasal spray Place 2 sprays into both nostrils daily.  Fluticasone -Umeclidin-Vilant (TRELEGY ELLIPTA ) 100-62.5-25 MCG/ACT AEPB Inhale 1 puff into the lungs daily.   gabapentin  (NEURONTIN ) 300 MG capsule Take 1 capsule (300 mg total) by mouth 2 (two) times daily. Take 2 capsules PO every morning; Take 3 capsules PO every evening.   ipratropium-albuterol  (DUONEB) 0.5-2.5 (3) MG/3ML SOLN Take 3 mLs by nebulization every 6 (six) hours as needed.   levothyroxine  (SYNTHROID ) 75 MCG tablet Take 1 tablet (75 mcg total) by mouth daily before breakfast.   metoprolol  succinate (TOPROL -XL) 50 MG 24 hr tablet Take 1 tablet (50 mg total) by mouth daily.   montelukast  (SINGULAIR ) 10 MG tablet Take 1 tablet (10 mg total) by mouth at bedtime.   pantoprazole  (PROTONIX ) 20 MG tablet TAKE 1 TABLET(20 MG) BY MOUTH TWICE DAILY BEFORE A MEAL   primidone  (MYSOLINE ) 50 MG tablet Take 0.5 tablets (25 mg total) by mouth 2 (two) times daily.   rosuvastatin  (CRESTOR ) 10 MG tablet Take 1 tablet (10 mg total) by mouth daily.   tirzepatide  (MOUNJARO ) 7.5 MG/0.5ML Pen Inject 7.5 mg into the skin once a week.   traMADol  (ULTRAM ) 50 MG  tablet Take 50 mg by mouth 2 (two) times daily as needed for moderate pain.   [DISCONTINUED] amLODipine  (NORVASC ) 5 MG tablet TAKE 1 TABLET(5 MG) BY MOUTH DAILY   [DISCONTINUED] blood glucose meter kit and supplies Dispense based on patient and insurance preference. Once daily. (FOR ICD-10 E10.9, E11.9).   [DISCONTINUED] Cholecalciferol  (VITAMIN D ) 50 MCG (2000 UT) tablet Take 2,000 Units by mouth daily.   [DISCONTINUED] glucose blood (CONTOUR NEXT TEST) test strip To check blood sugar once daily   [DISCONTINUED] loperamide  (IMODIUM  A-D) 2 MG tablet Take 2-4 mg by mouth 4 (four) times daily as needed for diarrhea or loose stools.   [DISCONTINUED] meloxicam  (MOBIC ) 15 MG tablet Take 1 tablet (15 mg total) by mouth daily as needed for pain.   [DISCONTINUED] metFORMIN  (GLUCOPHAGE -XR) 500 MG 24 hr tablet Take 2 tablets (1,000 mg total) by mouth 2 (two) times daily.   [DISCONTINUED] nystatin  (MYCOSTATIN ) 100000 UNIT/ML suspension Take 5 mLs (500,000 Units total) by mouth 4 (four) times daily.   [DISCONTINUED] nystatin  (MYCOSTATIN /NYSTOP ) powder Apply 1 application topically 3 (three) times daily.   No facility-administered medications prior to visit.   "

## 2024-08-16 NOTE — Patient Instructions (Signed)
 [facility]   [address], [address] [place],  [place]  [address] Main: [phone]

## 2024-08-17 DIAGNOSIS — F5101 Primary insomnia: Secondary | ICD-10-CM | POA: Insufficient documentation

## 2024-08-17 DIAGNOSIS — H6990 Unspecified Eustachian tube disorder, unspecified ear: Secondary | ICD-10-CM | POA: Insufficient documentation

## 2024-08-17 DIAGNOSIS — M2550 Pain in unspecified joint: Secondary | ICD-10-CM | POA: Insufficient documentation

## 2024-08-17 LAB — MICROALBUMIN / CREATININE URINE RATIO
Creatinine, Urine: 73.5 mg/dL
Microalb/Creat Ratio: 234 mg/g{creat} — ABNORMAL HIGH (ref 0–29)
Microalbumin, Urine: 172.2 ug/mL

## 2024-08-17 LAB — SPECIMEN STATUS REPORT

## 2024-08-19 ENCOUNTER — Encounter: Payer: Self-pay | Admitting: *Deleted

## 2024-08-26 ENCOUNTER — Other Ambulatory Visit: Payer: Self-pay

## 2024-08-26 DIAGNOSIS — Z87891 Personal history of nicotine dependence: Secondary | ICD-10-CM

## 2024-08-26 DIAGNOSIS — F17210 Nicotine dependence, cigarettes, uncomplicated: Secondary | ICD-10-CM

## 2024-08-26 DIAGNOSIS — Z122 Encounter for screening for malignant neoplasm of respiratory organs: Secondary | ICD-10-CM

## 2024-09-03 ENCOUNTER — Other Ambulatory Visit: Payer: Self-pay

## 2024-09-03 ENCOUNTER — Telehealth: Payer: Self-pay

## 2024-09-03 MED ORDER — TRELEGY ELLIPTA 100-62.5-25 MCG/ACT IN AEPB: 1.0000 | INHALATION_SPRAY | Freq: Every day | RESPIRATORY_TRACT | 11 refills | Status: AC

## 2024-09-03 NOTE — Telephone Encounter (Signed)
 [facility] faxed refill request for the following medications:  Fluticasone -Umeclidin-Vilant (TRELEGY ELLIPTA ) 100-62.5-25 MCG/ACT AEPB     Please advise.

## 2024-09-04 ENCOUNTER — Encounter: Payer: Self-pay | Admitting: Medical

## 2024-09-04 ENCOUNTER — Ambulatory Visit: Admitting: Medical

## 2024-09-04 VITALS — BP 110/68 | HR 84 | Ht 69.0 in | Wt 156.5 lb

## 2024-09-04 DIAGNOSIS — R0602 Shortness of breath: Secondary | ICD-10-CM | POA: Diagnosis not present

## 2024-09-04 DIAGNOSIS — E782 Mixed hyperlipidemia: Secondary | ICD-10-CM | POA: Diagnosis not present

## 2024-09-04 DIAGNOSIS — I700 Atherosclerosis of aorta: Secondary | ICD-10-CM | POA: Diagnosis not present

## 2024-09-04 DIAGNOSIS — Z720 Tobacco use: Secondary | ICD-10-CM | POA: Diagnosis not present

## 2024-09-04 DIAGNOSIS — I2510 Atherosclerotic heart disease of native coronary artery without angina pectoris: Secondary | ICD-10-CM

## 2024-09-04 DIAGNOSIS — Z79899 Other long term (current) drug therapy: Secondary | ICD-10-CM

## 2024-09-04 NOTE — Patient Instructions (Signed)
 Medication Instructions:   Your physician recommends the following medication changes.  STOP TAKING: Amlodipine    *If you need a refill on your cardiac medications before your next appointment, please call your pharmacy*  Lab Work:  Your provider would like for you to have following labs drawn today Lipid Panel.    If you have labs (blood work) drawn today and your tests are completely normal, you will receive your results only by:  MyChart Message (if you have MyChart) OR  A paper copy in the mail If you have any lab test that is abnormal or we need to change your treatment, we will call you to review the results.  Testing/Procedures:  None ordered at this time   Referrals:  None ordered at this time   Follow-Up:  At [facility], you and your health needs are our priority.  As part of our continuing mission to provide you with exceptional heart care, our providers are all part of one team.  This team includes your primary Cardiologist (physician) and Advanced Practice Providers or APPs (Physician Assistants and Nurse Practitioners) who all work together to provide you with the care you need, when you need it.  Your next appointment:   5 - 6 month(s)  Provider:    You may see one of the following Advanced Practice Providers on your designated Care Team:   Lonni Meager, NP Lesley Maffucci, PA-C Bernardino Bring, PA-C Cadence Tarlton, PA-C Tylene Lunch, NP Barnie Hila, NP    We recommend signing up for the patient portal called MyChart.  Sign up information is provided on this After Visit Summary.  MyChart is used to connect with patients for Virtual Visits (Telemedicine).  Patients are able to view lab/test results, encounter notes, upcoming appointments, etc.  Non-urgent messages can be sent to your provider as well.   To learn more about what you can do with MyChart, go to [email or web address].

## 2024-09-04 NOTE — Progress Notes (Signed)
 " Cardiology Office Note   Date:  09/04/2024  ID:  Linda Bradford, DOB February 10, 1956, MRN [id number] PCP: Franchot Isaiah LABOR, MD  [facility] Providers Cardiologist:  None   History of Present Illness Linda Bradford is a 68 y.o. female with a history of diabetes, hypertension, COPD, smoker, aortic valve calcification, aortic atherosclerosis, and CAC who presents for  follow-up of chest pain and SOB.    Echo in 2018 showed normal LVEF, mild to mod LVH, mild AI.   The patient was last seen 07/04/24 reporting fatigue, chest pain and SOB. Cardiac PET stress test was normal, low risk with no evidence of ischemia or infarction. Moderate coronary calcifications were presents. Echo showed LVEF 60-65%, mild LVH, G1DD, trivial MR, mild AI.  Today, the patient is overall doing OK. She has lost about 70lbs on Mounjaro , and BP is gradually decreasing. She has chronic DOE from COPD. She denies chest pain. She is on metoprolol  for tremors.    Studies Reviewed      Echo 08/2024  1. Left ventricular ejection fraction, by estimation, is 60 to 65%. Left  ventricular ejection fraction by 3D volume is 61 %. The left ventricle has  normal function. Left ventricular endocardial border not optimally defined  to evaluate regional wall  motion. There is mild left ventricular hypertrophy. Left ventricular  diastolic parameters are consistent with Grade I diastolic dysfunction  (impaired relaxation). The average left ventricular global longitudinal  strain is -16.2 %. The global longitudinal   strain is normal.   2. Right ventricular systolic function is normal. The right ventricular  size is normal.   3. A small pericardial effusion is present.   4. The mitral valve is degenerative. Trivial mitral valve regurgitation.  No evidence of mitral stenosis.   5. The aortic valve is tricuspid. There is moderate calcification of the  aortic valve. Aortic valve regurgitation is mild. Aortic valve   sclerosis/calcification is present, without any evidence of aortic  stenosis.   6. The inferior vena cava is normal in size with greater than 50%  respiratory variability, suggesting right atrial pressure of 3 mmHg.    Cardiac PET stress test 08/2024   The study is normal. The study is low risk.   LV perfusion is normal. There is no evidence of ischemia. There is no evidence of infarction.   Rest left ventricular function is normal. Rest EF: 61%. Stress left ventricular function is normal. Stress EF: 65%. End diastolic cavity size is normal. End systolic cavity size is normal. No evidence of transient ischemic dilation (TID) noted.   Myocardial blood flow was computed to be 0.27ml/g/min at rest and 2.81ml/g/min at stress. Global myocardial blood flow reserve was 3.10 and was normal.   Coronary calcium  was present on the attenuation correction CT images. Moderate coronary calcifications were present. Coronary calcifications were present in the left anterior descending artery distribution(s).       Physical Exam VS:  BP 110/68 (BP Location: Left Arm, Patient Position: Sitting, Cuff Size: Normal)   Pulse 84   Ht 5' 9 (1.753 m)   Wt 156 lb 8 oz (71 kg)   SpO2 94%   BMI 23.11 kg/m        Wt Readings from Last 3 Encounters:  09/04/24 156 lb 8 oz (71 kg)  08/16/24 158 lb 12.8 oz (72 kg)  08/08/24 163 lb (73.9 kg)    GEN: Well nourished, well developed in no acute distress NECK: No JVD; No  carotid bruits CARDIAC: RRR, no murmurs, rubs, gallops RESPIRATORY:  Clear to auscultation without rales, wheezing or rhonchi  ABDOMEN: Soft, non-tender, non-distended EXTREMITIES:  No edema; No deformity   ASSESSMENT AND PLAN  Chronic DOE/chest pain CAC Echo showed normal pump function, mild LVH, grade 1 diastolic dysfunction, small pericardial effusion, trivial MR.  Cardiac PET stress was low risk and normal, with no evidence of ischemia or infarction.  It showed moderate CAC, CAC in the LAD.   Patient denies any chest pain.  She has chronic DOE from COPD.  Patient is not activity due to low food intake and grief/depression regarding the loss of her husband (he died 2 years ago).  Recommended regular walking/activity. Continue statin therapy.   Aortic atherosclerosis HLD Repeat lipid panel today.  Last LDL was at goal.  Continue Zetia  and Crestor .  HTN BP gradually decreasing with weight loss. I will stop amlodipine . Continue Torol 50mg  daily.   Tobacco use Patient is still smoking, recommend complete cessation.       Dispo: Follow-up in 6 months  Signed, Elgie Maziarz VEAR Fishman, PA-C   "

## 2024-09-05 ENCOUNTER — Ambulatory Visit: Payer: Self-pay | Admitting: Medical

## 2024-09-05 LAB — LIPID PANEL
Chol/HDL Ratio: 2.6 ratio (ref 0.0–4.4)
Cholesterol, Total: 137 mg/dL (ref 100–199)
HDL: 53 mg/dL
LDL Chol Calc (NIH): 65 mg/dL (ref 0–99)
Triglycerides: 102 mg/dL (ref 0–149)
VLDL Cholesterol Cal: 19 mg/dL (ref 5–40)

## 2024-09-13 ENCOUNTER — Ambulatory Visit

## 2024-10-31 ENCOUNTER — Ambulatory Visit

## 2025-01-28 ENCOUNTER — Ambulatory Visit: Admitting: Medical
